# Patient Record
Sex: Female | Born: 1955 | Race: White | Hispanic: No | Smoking: Former smoker
Health system: Southern US, Community
[De-identification: ages and names within clinical notes are randomized; demographics above are authoritative.]

## PROBLEM LIST (undated history)

## (undated) DIAGNOSIS — I10 Essential (primary) hypertension: Secondary | ICD-10-CM

## (undated) DIAGNOSIS — R062 Wheezing: Secondary | ICD-10-CM

## (undated) DIAGNOSIS — F419 Anxiety disorder, unspecified: Secondary | ICD-10-CM

## (undated) DIAGNOSIS — R42 Dizziness and giddiness: Secondary | ICD-10-CM

## (undated) DIAGNOSIS — G629 Polyneuropathy, unspecified: Secondary | ICD-10-CM

## (undated) DIAGNOSIS — Z87442 Personal history of urinary calculi: Secondary | ICD-10-CM

## (undated) DIAGNOSIS — T8859XA Other complications of anesthesia, initial encounter: Secondary | ICD-10-CM

## (undated) DIAGNOSIS — R0601 Orthopnea: Secondary | ICD-10-CM

## (undated) DIAGNOSIS — R0600 Dyspnea, unspecified: Secondary | ICD-10-CM

## (undated) DIAGNOSIS — T4145XA Adverse effect of unspecified anesthetic, initial encounter: Secondary | ICD-10-CM

## (undated) DIAGNOSIS — D509 Iron deficiency anemia, unspecified: Secondary | ICD-10-CM

## (undated) DIAGNOSIS — E119 Type 2 diabetes mellitus without complications: Secondary | ICD-10-CM

## (undated) DIAGNOSIS — J449 Chronic obstructive pulmonary disease, unspecified: Secondary | ICD-10-CM

## (undated) DIAGNOSIS — E039 Hypothyroidism, unspecified: Secondary | ICD-10-CM

## (undated) DIAGNOSIS — K219 Gastro-esophageal reflux disease without esophagitis: Secondary | ICD-10-CM

## (undated) DIAGNOSIS — R609 Edema, unspecified: Secondary | ICD-10-CM

## (undated) DIAGNOSIS — D649 Anemia, unspecified: Secondary | ICD-10-CM

## (undated) DIAGNOSIS — I499 Cardiac arrhythmia, unspecified: Secondary | ICD-10-CM

## (undated) DIAGNOSIS — N189 Chronic kidney disease, unspecified: Secondary | ICD-10-CM

## (undated) DIAGNOSIS — I509 Heart failure, unspecified: Secondary | ICD-10-CM

## (undated) DIAGNOSIS — S72409A Unspecified fracture of lower end of unspecified femur, initial encounter for closed fracture: Secondary | ICD-10-CM

## (undated) DIAGNOSIS — G4730 Sleep apnea, unspecified: Secondary | ICD-10-CM

## (undated) HISTORY — DX: Iron deficiency anemia, unspecified: D50.9

## (undated) HISTORY — DX: Heart failure, unspecified: I50.9

## (undated) HISTORY — PX: TONSILLECTOMY: SUR1361

## (undated) HISTORY — PX: CHOLECYSTECTOMY: SHX55

---

## 2004-12-21 ENCOUNTER — Ambulatory Visit: Payer: Self-pay | Admitting: Unknown Physician Specialty

## 2004-12-30 ENCOUNTER — Ambulatory Visit: Payer: Self-pay | Admitting: Unknown Physician Specialty

## 2007-06-13 ENCOUNTER — Ambulatory Visit: Payer: Self-pay | Admitting: Unknown Physician Specialty

## 2007-08-09 ENCOUNTER — Ambulatory Visit: Payer: Self-pay | Admitting: Unknown Physician Specialty

## 2008-06-25 ENCOUNTER — Ambulatory Visit: Payer: Self-pay | Admitting: Unknown Physician Specialty

## 2009-09-23 ENCOUNTER — Ambulatory Visit: Payer: Self-pay | Admitting: Unknown Physician Specialty

## 2010-02-23 ENCOUNTER — Ambulatory Visit: Payer: Self-pay | Admitting: Ophthalmology

## 2010-03-02 ENCOUNTER — Ambulatory Visit: Payer: Self-pay | Admitting: Ophthalmology

## 2010-10-25 ENCOUNTER — Ambulatory Visit: Payer: Self-pay | Admitting: Family Medicine

## 2011-06-30 ENCOUNTER — Emergency Department: Payer: Self-pay | Admitting: Emergency Medicine

## 2012-02-04 ENCOUNTER — Ambulatory Visit: Payer: Self-pay | Admitting: Internal Medicine

## 2012-07-26 ENCOUNTER — Emergency Department: Payer: Self-pay | Admitting: Emergency Medicine

## 2013-08-07 ENCOUNTER — Ambulatory Visit: Payer: Self-pay | Admitting: Internal Medicine

## 2014-02-11 ENCOUNTER — Ambulatory Visit: Payer: Self-pay | Admitting: Physician Assistant

## 2014-02-11 LAB — RAPID STREP-A WITH REFLX: Micro Text Report: NEGATIVE

## 2014-02-13 LAB — BETA STREP CULTURE(ARMC)

## 2016-01-08 ENCOUNTER — Other Ambulatory Visit: Payer: Self-pay | Admitting: Otolaryngology

## 2016-01-08 ENCOUNTER — Ambulatory Visit
Admission: RE | Admit: 2016-01-08 | Discharge: 2016-01-08 | Disposition: A | Discharge status: Home / Self Care | Payer: Medicare Other | Source: Ambulatory Visit | Attending: Otolaryngology | Admitting: Otolaryngology

## 2016-01-08 DIAGNOSIS — R05 Cough: Secondary | ICD-10-CM | POA: Insufficient documentation

## 2016-01-08 DIAGNOSIS — R059 Cough, unspecified: Secondary | ICD-10-CM

## 2017-01-18 ENCOUNTER — Encounter: Payer: Self-pay | Admitting: *Deleted

## 2017-01-25 NOTE — H&P (Signed)
See scanned note.

## 2017-01-26 ENCOUNTER — Inpatient Hospital Stay
Admission: AD | Admit: 2017-01-26 | Discharge: 2017-01-26 | Disposition: A | Discharge status: Home / Self Care | Payer: Medicare Other | Source: Ambulatory Visit | Attending: Critical Care Medicine | Admitting: Critical Care Medicine

## 2017-01-26 ENCOUNTER — Inpatient Hospital Stay
Admission: AD | Admit: 2017-01-26 | Discharge: 2017-01-28 | DRG: 987 | Disposition: A | Discharge status: Home / Self Care | Payer: Medicare Other | Source: Ambulatory Visit | Attending: Internal Medicine | Admitting: Internal Medicine

## 2017-01-26 ENCOUNTER — Ambulatory Visit: Payer: Medicare Other | Admitting: Anesthesiology

## 2017-01-26 ENCOUNTER — Encounter: Payer: Self-pay | Admitting: *Deleted

## 2017-01-26 ENCOUNTER — Ambulatory Visit: Payer: Medicare Other

## 2017-01-26 ENCOUNTER — Encounter
Admission: AD | Disposition: A | Discharge status: Home / Self Care | Payer: Self-pay | Source: Ambulatory Visit | Attending: Internal Medicine

## 2017-01-26 DIAGNOSIS — E1122 Type 2 diabetes mellitus with diabetic chronic kidney disease: Secondary | ICD-10-CM | POA: Diagnosis present

## 2017-01-26 DIAGNOSIS — J9601 Acute respiratory failure with hypoxia: Principal | ICD-10-CM | POA: Diagnosis present

## 2017-01-26 DIAGNOSIS — R0602 Shortness of breath: Secondary | ICD-10-CM

## 2017-01-26 DIAGNOSIS — E1140 Type 2 diabetes mellitus with diabetic neuropathy, unspecified: Secondary | ICD-10-CM | POA: Diagnosis present

## 2017-01-26 DIAGNOSIS — H2512 Age-related nuclear cataract, left eye: Secondary | ICD-10-CM | POA: Diagnosis present

## 2017-01-26 DIAGNOSIS — I480 Paroxysmal atrial fibrillation: Secondary | ICD-10-CM | POA: Diagnosis present

## 2017-01-26 DIAGNOSIS — Z6841 Body Mass Index (BMI) 40.0 and over, adult: Secondary | ICD-10-CM

## 2017-01-26 DIAGNOSIS — Z87891 Personal history of nicotine dependence: Secondary | ICD-10-CM | POA: Diagnosis not present

## 2017-01-26 DIAGNOSIS — I129 Hypertensive chronic kidney disease with stage 1 through stage 4 chronic kidney disease, or unspecified chronic kidney disease: Secondary | ICD-10-CM | POA: Diagnosis present

## 2017-01-26 DIAGNOSIS — J811 Chronic pulmonary edema: Secondary | ICD-10-CM

## 2017-01-26 DIAGNOSIS — G4733 Obstructive sleep apnea (adult) (pediatric): Secondary | ICD-10-CM | POA: Diagnosis present

## 2017-01-26 DIAGNOSIS — F410 Panic disorder [episodic paroxysmal anxiety] without agoraphobia: Secondary | ICD-10-CM | POA: Diagnosis present

## 2017-01-26 DIAGNOSIS — I248 Other forms of acute ischemic heart disease: Secondary | ICD-10-CM | POA: Diagnosis present

## 2017-01-26 DIAGNOSIS — T45515A Adverse effect of anticoagulants, initial encounter: Secondary | ICD-10-CM | POA: Diagnosis present

## 2017-01-26 DIAGNOSIS — K219 Gastro-esophageal reflux disease without esophagitis: Secondary | ICD-10-CM | POA: Diagnosis present

## 2017-01-26 DIAGNOSIS — N179 Acute kidney failure, unspecified: Secondary | ICD-10-CM | POA: Diagnosis present

## 2017-01-26 DIAGNOSIS — J189 Pneumonia, unspecified organism: Secondary | ICD-10-CM | POA: Diagnosis present

## 2017-01-26 DIAGNOSIS — N182 Chronic kidney disease, stage 2 (mild): Secondary | ICD-10-CM | POA: Diagnosis present

## 2017-01-26 DIAGNOSIS — Z7901 Long term (current) use of anticoagulants: Secondary | ICD-10-CM | POA: Diagnosis not present

## 2017-01-26 DIAGNOSIS — J9600 Acute respiratory failure, unspecified whether with hypoxia or hypercapnia: Secondary | ICD-10-CM | POA: Diagnosis present

## 2017-01-26 DIAGNOSIS — D649 Anemia, unspecified: Secondary | ICD-10-CM | POA: Diagnosis present

## 2017-01-26 DIAGNOSIS — E039 Hypothyroidism, unspecified: Secondary | ICD-10-CM | POA: Diagnosis present

## 2017-01-26 DIAGNOSIS — J810 Acute pulmonary edema: Secondary | ICD-10-CM

## 2017-01-26 DIAGNOSIS — R0902 Hypoxemia: Secondary | ICD-10-CM

## 2017-01-26 HISTORY — DX: Orthopnea: R06.01

## 2017-01-26 HISTORY — DX: Personal history of urinary calculi: Z87.442

## 2017-01-26 HISTORY — DX: Gastro-esophageal reflux disease without esophagitis: K21.9

## 2017-01-26 HISTORY — DX: Dyspnea, unspecified: R06.00

## 2017-01-26 HISTORY — DX: Hypothyroidism, unspecified: E03.9

## 2017-01-26 HISTORY — DX: Edema, unspecified: R60.9

## 2017-01-26 HISTORY — DX: Polyneuropathy, unspecified: G62.9

## 2017-01-26 HISTORY — DX: Cardiac arrhythmia, unspecified: I49.9

## 2017-01-26 HISTORY — DX: Dizziness and giddiness: R42

## 2017-01-26 HISTORY — DX: Anemia, unspecified: D64.9

## 2017-01-26 HISTORY — DX: Anxiety disorder, unspecified: F41.9

## 2017-01-26 HISTORY — DX: Wheezing: R06.2

## 2017-01-26 HISTORY — DX: Type 2 diabetes mellitus without complications: E11.9

## 2017-01-26 HISTORY — DX: Sleep apnea, unspecified: G47.30

## 2017-01-26 HISTORY — DX: Essential (primary) hypertension: I10

## 2017-01-26 HISTORY — PX: CATARACT EXTRACTION W/PHACO: SHX586

## 2017-01-26 HISTORY — DX: Chronic kidney disease, unspecified: N18.9

## 2017-01-26 LAB — RAPID HIV SCREEN (HIV 1/2 AB+AG)
HIV 1/2 Antibodies: NONREACTIVE
HIV-1 P24 Antigen - HIV24: NONREACTIVE

## 2017-01-26 LAB — CBC
HCT: 31.2 % — ABNORMAL LOW (ref 35.0–47.0)
HEMOGLOBIN: 9.1 g/dL — AB (ref 12.0–16.0)
MCH: 20.4 pg — AB (ref 26.0–34.0)
MCHC: 29.2 g/dL — AB (ref 32.0–36.0)
MCV: 69.9 fL — ABNORMAL LOW (ref 80.0–100.0)
Platelets: 312 10*3/uL (ref 150–440)
RBC: 4.46 MIL/uL (ref 3.80–5.20)
RDW: 19.8 % — ABNORMAL HIGH (ref 11.5–14.5)
WBC: 20.8 10*3/uL — ABNORMAL HIGH (ref 3.6–11.0)

## 2017-01-26 LAB — BASIC METABOLIC PANEL
Anion gap: 10 (ref 5–15)
BUN: 56 mg/dL — ABNORMAL HIGH (ref 6–20)
CHLORIDE: 105 mmol/L (ref 101–111)
CO2: 24 mmol/L (ref 22–32)
CREATININE: 1.67 mg/dL — AB (ref 0.44–1.00)
Calcium: 8.8 mg/dL — ABNORMAL LOW (ref 8.9–10.3)
GFR, EST AFRICAN AMERICAN: 37 mL/min — AB (ref >60)
GFR, EST NON AFRICAN AMERICAN: 32 mL/min — AB (ref >60)
Glucose, Bld: 280 mg/dL — ABNORMAL HIGH (ref 65–99)
Potassium: 4.6 mmol/L (ref 3.5–5.1)
Sodium: 139 mmol/L (ref 135–145)

## 2017-01-26 LAB — TROPONIN I: TROPONIN I: 0.03 ng/mL — AB (ref <0.03)

## 2017-01-26 LAB — PROTIME-INR
INR: 2.3
Prothrombin Time: 25.7 seconds — ABNORMAL HIGH (ref 11.4–15.2)

## 2017-01-26 LAB — PROCALCITONIN

## 2017-01-26 LAB — GLUCOSE, CAPILLARY
GLUCOSE-CAPILLARY: 261 mg/dL — AB (ref 65–99)
Glucose-Capillary: 150 mg/dL — ABNORMAL HIGH (ref 65–99)
Glucose-Capillary: 255 mg/dL — ABNORMAL HIGH (ref 65–99)
Glucose-Capillary: 314 mg/dL — ABNORMAL HIGH (ref 65–99)

## 2017-01-26 LAB — BRAIN NATRIURETIC PEPTIDE: B Natriuretic Peptide: 816 pg/mL — ABNORMAL HIGH (ref 0.0–100.0)

## 2017-01-26 SURGERY — PHACOEMULSIFICATION, CATARACT, WITH IOL INSERTION
Anesthesia: Monitor Anesthesia Care | Laterality: Left

## 2017-01-26 MED ORDER — GLIMEPIRIDE 2 MG PO TABS
2.0000 mg | ORAL_TABLET | Freq: Every day | ORAL | Status: DC
  Administered 2017-01-27 – 2017-01-28 (×2): 2 mg via ORAL
  Filled 2017-01-26 (×2): qty 1

## 2017-01-26 MED ORDER — NEOSTIGMINE METHYLSULFATE 10 MG/10ML IV SOLN
INTRAVENOUS | Status: AC
  Filled 2017-01-26: qty 1

## 2017-01-26 MED ORDER — TETRACAINE HCL 0.5 % OP SOLN
OPHTHALMIC | Status: DC | PRN
  Administered 2017-01-26: 2 [drp] via OPHTHALMIC

## 2017-01-26 MED ORDER — LIDOCAINE HCL (PF) 4 % IJ SOLN
INTRAMUSCULAR | Status: DC | PRN
  Administered 2017-01-26: 5 mL via OPHTHALMIC

## 2017-01-26 MED ORDER — LINAGLIPTIN 5 MG PO TABS
5.0000 mg | ORAL_TABLET | Freq: Every day | ORAL | Status: DC
  Administered 2017-01-26 – 2017-01-28 (×3): 5 mg via ORAL
  Filled 2017-01-26 (×3): qty 1

## 2017-01-26 MED ORDER — TETRACAINE HCL 0.5 % OP SOLN
OPHTHALMIC | Status: AC
  Filled 2017-01-26: qty 2

## 2017-01-26 MED ORDER — NA CHONDROIT SULF-NA HYALURON 40-17 MG/ML IO SOLN
INTRAOCULAR | Status: DC | PRN
  Administered 2017-01-26: 1 mL via INTRAOCULAR

## 2017-01-26 MED ORDER — ALFENTANIL 500 MCG/ML IJ INJ
INJECTION | INTRAVENOUS | Status: DC | PRN
  Administered 2017-01-26: 500 ug via INTRAVENOUS

## 2017-01-26 MED ORDER — PHENYLEPHRINE HCL 10 MG/ML IJ SOLN
INTRAMUSCULAR | Status: AC
  Filled 2017-01-26: qty 1

## 2017-01-26 MED ORDER — NA CHONDROIT SULF-NA HYALURON 40-17 MG/ML IO SOLN
INTRAOCULAR | Status: AC
  Filled 2017-01-26: qty 1

## 2017-01-26 MED ORDER — INSULIN GLARGINE 100 UNIT/ML ~~LOC~~ SOLN
12.0000 [IU] | Freq: Every day | SUBCUTANEOUS | Status: DC
  Administered 2017-01-26 – 2017-01-27 (×2): 12 [IU] via SUBCUTANEOUS
  Filled 2017-01-26 (×4): qty 0.12

## 2017-01-26 MED ORDER — PANTOPRAZOLE SODIUM 40 MG PO TBEC
40.0000 mg | DELAYED_RELEASE_TABLET | Freq: Two times a day (BID) | ORAL | Status: DC
  Administered 2017-01-26 – 2017-01-28 (×4): 40 mg via ORAL
  Filled 2017-01-26 (×4): qty 1

## 2017-01-26 MED ORDER — CELECOXIB 100 MG PO CAPS
200.0000 mg | ORAL_CAPSULE | Freq: Every day | ORAL | Status: DC
  Administered 2017-01-26 – 2017-01-28 (×2): 200 mg via ORAL
  Filled 2017-01-26: qty 1
  Filled 2017-01-26: qty 2
  Filled 2017-01-26: qty 1

## 2017-01-26 MED ORDER — LIDOCAINE HCL (PF) 4 % IJ SOLN
INTRAOCULAR | Status: DC | PRN
  Administered 2017-01-26: .5 mL via OPHTHALMIC

## 2017-01-26 MED ORDER — ALBUTEROL SULFATE HFA 108 (90 BASE) MCG/ACT IN AERS
INHALATION_SPRAY | RESPIRATORY_TRACT | Status: DC | PRN
  Administered 2017-01-26: 10 via RESPIRATORY_TRACT
  Administered 2017-01-26: 2 via RESPIRATORY_TRACT

## 2017-01-26 MED ORDER — CYCLOPENTOLATE HCL 2 % OP SOLN
1.0000 [drp] | OPHTHALMIC | Status: AC
  Administered 2017-01-26 (×2): 1 [drp] via OPHTHALMIC

## 2017-01-26 MED ORDER — EPHEDRINE SULFATE 50 MG/ML IJ SOLN
INTRAMUSCULAR | Status: DC | PRN
  Administered 2017-01-26: 10 mg via INTRAVENOUS

## 2017-01-26 MED ORDER — EPINEPHRINE PF 1 MG/ML IJ SOLN
INTRAMUSCULAR | Status: AC
  Filled 2017-01-26: qty 2

## 2017-01-26 MED ORDER — PHENYLEPHRINE HCL 10 % OP SOLN
OPHTHALMIC | Status: AC
  Administered 2017-01-26: 09:00:00
  Filled 2017-01-26: qty 5

## 2017-01-26 MED ORDER — MOXIFLOXACIN HCL 0.5 % OP SOLN
OPHTHALMIC | Status: AC
  Filled 2017-01-26: qty 3

## 2017-01-26 MED ORDER — SODIUM CHLORIDE 0.9 % IV SOLN: 250.0000 mL | INTRAVENOUS | Status: DC | PRN

## 2017-01-26 MED ORDER — PHENYLEPHRINE HCL 10 % OP SOLN
1.0000 [drp] | OPHTHALMIC | Status: AC
  Administered 2017-01-26 (×3): 1 [drp] via OPHTHALMIC

## 2017-01-26 MED ORDER — SODIUM CHLORIDE 0.9% FLUSH
3.0000 mL | Freq: Two times a day (BID) | INTRAVENOUS | Status: DC
  Administered 2017-01-27 (×2): 3 mL via INTRAVENOUS

## 2017-01-26 MED ORDER — MAGNESIUM OXIDE 400 (241.3 MG) MG PO TABS
400.0000 mg | ORAL_TABLET | Freq: Two times a day (BID) | ORAL | Status: DC
  Administered 2017-01-26 – 2017-01-28 (×4): 400 mg via ORAL
  Filled 2017-01-26 (×4): qty 1

## 2017-01-26 MED ORDER — CYCLOPENTOLATE HCL 2 % OP SOLN
OPHTHALMIC | Status: AC
  Filled 2017-01-26: qty 2

## 2017-01-26 MED ORDER — SUCCINYLCHOLINE CHLORIDE 20 MG/ML IJ SOLN
INTRAMUSCULAR | Status: DC | PRN
  Administered 2017-01-26: 80 mg via INTRAVENOUS

## 2017-01-26 MED ORDER — FUROSEMIDE 10 MG/ML IJ SOLN
INTRAMUSCULAR | Status: AC
  Administered 2017-01-26: 10 mg via INTRAVENOUS
  Filled 2017-01-26: qty 2

## 2017-01-26 MED ORDER — SODIUM CHLORIDE 0.9 % IV SOLN
INTRAVENOUS | Status: DC
  Administered 2017-01-26 (×2): via INTRAVENOUS

## 2017-01-26 MED ORDER — BENAZEPRIL HCL 20 MG PO TABS
20.0000 mg | ORAL_TABLET | Freq: Every day | ORAL | Status: DC
  Filled 2017-01-26 (×2): qty 1

## 2017-01-26 MED ORDER — SUGAMMADEX SODIUM 200 MG/2ML IV SOLN
INTRAVENOUS | Status: DC | PRN
  Administered 2017-01-26: 200 mg via INTRAVENOUS

## 2017-01-26 MED ORDER — ROCURONIUM BROMIDE 100 MG/10ML IV SOLN
INTRAVENOUS | Status: DC | PRN
  Administered 2017-01-26: 20 mg via INTRAVENOUS

## 2017-01-26 MED ORDER — METOPROLOL SUCCINATE ER 50 MG PO TB24
100.0000 mg | ORAL_TABLET | Freq: Every morning | ORAL | Status: DC
  Administered 2017-01-27 – 2017-01-28 (×2): 100 mg via ORAL
  Filled 2017-01-26 (×2): qty 2

## 2017-01-26 MED ORDER — PHENYLEPHRINE HCL 10 MG/ML IJ SOLN
INTRAMUSCULAR | Status: DC | PRN
  Administered 2017-01-26 (×2): 100 ug via INTRAVENOUS

## 2017-01-26 MED ORDER — EPINEPHRINE PF 1 MG/ML IJ SOLN
INTRAMUSCULAR | Status: DC | PRN
  Administered 2017-01-26: 250 mL via OPHTHALMIC

## 2017-01-26 MED ORDER — CITALOPRAM HYDROBROMIDE 20 MG PO TABS
20.0000 mg | ORAL_TABLET | Freq: Every day | ORAL | Status: DC
Start: 2017-01-26 — End: 2017-01-28
  Administered 2017-01-26 – 2017-01-28 (×3): 20 mg via ORAL
  Filled 2017-01-26 (×3): qty 1

## 2017-01-26 MED ORDER — AMLODIPINE BESYLATE 5 MG PO TABS: 5.0000 mg | ORAL_TABLET | Freq: Every day | ORAL | Status: DC

## 2017-01-26 MED ORDER — FUROSEMIDE 10 MG/ML IJ SOLN
20.0000 mg | Freq: Once | INTRAMUSCULAR | Status: AC
  Administered 2017-01-26: 20 mg via INTRAVENOUS
  Filled 2017-01-26: qty 2

## 2017-01-26 MED ORDER — CARBACHOL 0.01 % IO SOLN
INTRAOCULAR | Status: DC | PRN
  Administered 2017-01-26: 0.5 mL via INTRAOCULAR

## 2017-01-26 MED ORDER — LACTATED RINGERS IV SOLN
INTRAVENOUS | Status: DC | PRN
Start: 2017-01-26 — End: 2017-01-26
  Administered 2017-01-26: 11:00:00 via INTRAVENOUS

## 2017-01-26 MED ORDER — HYALURONIDASE HUMAN 150 UNIT/ML IJ SOLN
INTRAMUSCULAR | Status: AC
  Filled 2017-01-26: qty 1

## 2017-01-26 MED ORDER — LORATADINE 10 MG PO TABS
10.0000 mg | ORAL_TABLET | Freq: Every day | ORAL | Status: DC
  Administered 2017-01-27 – 2017-01-28 (×2): 10 mg via ORAL
  Filled 2017-01-26 (×2): qty 1

## 2017-01-26 MED ORDER — PIPERACILLIN-TAZOBACTAM 3.375 G IVPB
3.3750 g | Freq: Three times a day (TID) | INTRAVENOUS | Status: DC
  Administered 2017-01-26 – 2017-01-27 (×2): 3.375 g via INTRAVENOUS
  Filled 2017-01-26 (×5): qty 50

## 2017-01-26 MED ORDER — CEFUROXIME OPHTHALMIC INJECTION 1 MG/0.1 ML
INJECTION | OPHTHALMIC | Status: DC | PRN
  Administered 2017-01-26: 0.1 mL via INTRACAMERAL

## 2017-01-26 MED ORDER — CEFUROXIME OPHTHALMIC INJECTION 1 MG/0.1 ML
INJECTION | OPHTHALMIC | Status: AC
  Filled 2017-01-26: qty 0.1

## 2017-01-26 MED ORDER — SODIUM CHLORIDE 0.9% FLUSH: 3.0000 mL | INTRAVENOUS | Status: DC | PRN

## 2017-01-26 MED ORDER — MIDAZOLAM HCL 2 MG/2ML IJ SOLN
INTRAMUSCULAR | Status: AC
  Filled 2017-01-26: qty 2

## 2017-01-26 MED ORDER — ALFENTANIL 500 MCG/ML IJ INJ
INJECTION | INTRAVENOUS | Status: AC
  Filled 2017-01-26: qty 5

## 2017-01-26 MED ORDER — NEOSTIGMINE METHYLSULFATE 10 MG/10ML IV SOLN
INTRAVENOUS | Status: DC | PRN
  Administered 2017-01-26: 3 mg via INTRAVENOUS

## 2017-01-26 MED ORDER — FUROSEMIDE 10 MG/ML IJ SOLN
10.0000 mg | Freq: Once | INTRAMUSCULAR | Status: AC
  Administered 2017-01-26: 10 mg via INTRAVENOUS

## 2017-01-26 MED ORDER — LEVOTHYROXINE SODIUM 50 MCG PO TABS: 175.0000 ug | ORAL_TABLET | Freq: Every day | ORAL | Status: DC

## 2017-01-26 MED ORDER — PRAVASTATIN SODIUM 20 MG PO TABS
10.0000 mg | ORAL_TABLET | Freq: Every day | ORAL | Status: DC
  Administered 2017-01-26 – 2017-01-27 (×2): 10 mg via ORAL
  Filled 2017-01-26 (×2): qty 1

## 2017-01-26 MED ORDER — PROPOFOL 10 MG/ML IV BOLUS
INTRAVENOUS | Status: DC | PRN
  Administered 2017-01-26: 150 mg via INTRAVENOUS

## 2017-01-26 MED ORDER — LIDOCAINE HCL (PF) 4 % IJ SOLN
INTRAMUSCULAR | Status: AC
  Filled 2017-01-26: qty 5

## 2017-01-26 MED ORDER — POVIDONE-IODINE 5 % OP SOLN
OPHTHALMIC | Status: AC
  Filled 2017-01-26: qty 30

## 2017-01-26 MED ORDER — AMLODIPINE BESY-BENAZEPRIL HCL 5-20 MG PO CAPS: 1.0000 | ORAL_CAPSULE | Freq: Every day | ORAL | Status: DC

## 2017-01-26 MED ORDER — MOXIFLOXACIN HCL 0.5 % OP SOLN
1.0000 [drp] | OPHTHALMIC | Status: AC
  Administered 2017-01-26: 1 [drp] via OPHTHALMIC

## 2017-01-26 MED ORDER — METHYLPREDNISOLONE SODIUM SUCC 125 MG IJ SOLR
INTRAMUSCULAR | Status: DC | PRN
  Administered 2017-01-26: 125 mg via INTRAVENOUS

## 2017-01-26 MED ORDER — POVIDONE-IODINE 5 % OP SOLN
OPHTHALMIC | Status: DC | PRN
  Administered 2017-01-26: 1 via OPHTHALMIC

## 2017-01-26 MED ORDER — MOXIFLOXACIN HCL 0.5 % OP SOLN
OPHTHALMIC | Status: DC | PRN
  Administered 2017-01-26: 2 [drp] via OPHTHALMIC

## 2017-01-26 MED ORDER — INSULIN ASPART 100 UNIT/ML ~~LOC~~ SOLN
0.0000 [IU] | Freq: Every day | SUBCUTANEOUS | Status: DC
  Administered 2017-01-26: 4 [IU] via SUBCUTANEOUS
  Administered 2017-01-27: 22:00:00 2 [IU] via SUBCUTANEOUS
  Filled 2017-01-26: qty 1
  Filled 2017-01-26: qty 2

## 2017-01-26 MED ORDER — DEXAMETHASONE SODIUM PHOSPHATE 10 MG/ML IJ SOLN
INTRAMUSCULAR | Status: DC | PRN
  Administered 2017-01-26: 10 mg via INTRAVENOUS

## 2017-01-26 MED ORDER — GLYCOPYRROLATE 0.2 MG/ML IV SOSY
PREFILLED_SYRINGE | INTRAVENOUS | Status: DC | PRN
  Administered 2017-01-26: 0.6 mg via INTRAVENOUS
  Administered 2017-01-26: .2 mg via INTRAVENOUS

## 2017-01-26 MED ORDER — INSULIN ASPART 100 UNIT/ML ~~LOC~~ SOLN
0.0000 [IU] | Freq: Three times a day (TID) | SUBCUTANEOUS | Status: DC
  Administered 2017-01-26: 8 [IU] via SUBCUTANEOUS
  Administered 2017-01-27 (×2): 3 [IU] via SUBCUTANEOUS
  Administered 2017-01-27: 5 [IU] via SUBCUTANEOUS
  Administered 2017-01-28: 3 [IU] via SUBCUTANEOUS
  Filled 2017-01-26: qty 1
  Filled 2017-01-26 (×2): qty 3
  Filled 2017-01-26: qty 1
  Filled 2017-01-26: qty 3

## 2017-01-26 MED ORDER — MIDAZOLAM HCL 2 MG/2ML IJ SOLN
INTRAMUSCULAR | Status: DC | PRN
  Administered 2017-01-26: 1 mg via INTRAVENOUS

## 2017-01-26 MED ORDER — BUPIVACAINE HCL (PF) 0.75 % IJ SOLN
INTRAMUSCULAR | Status: AC
  Filled 2017-01-26: qty 10

## 2017-01-26 SURGICAL SUPPLY — 28 items
CORD BIP STRL DISP 12FT (MISCELLANEOUS) ×3 IMPLANT
DRAPE XRAY CASSETTE 23X24 (DRAPES) ×3 IMPLANT
ERASER HMR WETFIELD 18G (MISCELLANEOUS) ×3 IMPLANT
GLOVE BIO SURGEON STRL SZ8 (GLOVE) ×3 IMPLANT
GLOVE SURG LX 6.5 MICRO (GLOVE) ×2
GLOVE SURG LX 8.0 MICRO (GLOVE) ×2
GLOVE SURG LX STRL 6.5 MICRO (GLOVE) ×1 IMPLANT
GLOVE SURG LX STRL 8.0 MICRO (GLOVE) ×1 IMPLANT
GOWN STRL REUS W/ TWL LRG LVL3 (GOWN DISPOSABLE) ×1 IMPLANT
GOWN STRL REUS W/ TWL XL LVL3 (GOWN DISPOSABLE) ×1 IMPLANT
GOWN STRL REUS W/TWL LRG LVL3 (GOWN DISPOSABLE) ×2
GOWN STRL REUS W/TWL XL LVL3 (GOWN DISPOSABLE) ×2
LENS IOL ACRSF IQ ULTRA 24.0 (Intraocular Lens) ×1 IMPLANT
LENS IOL ACRYSOF IQ 24.0 (Intraocular Lens) ×3 IMPLANT
PACK CATARACT (MISCELLANEOUS) ×3 IMPLANT
PACK CATARACT DINGLEDEIN LX (MISCELLANEOUS) ×3 IMPLANT
PACK EYE AFTER SURG (MISCELLANEOUS) ×3 IMPLANT
SHLD EYE VISITEC  UNIV (MISCELLANEOUS) ×3 IMPLANT
SOL BAL SALT 15ML (MISCELLANEOUS) ×6
SOL BSS BAG (MISCELLANEOUS) ×3
SOLUTION BAL SALT 15ML (MISCELLANEOUS) ×2 IMPLANT
SOLUTION BSS BAG (MISCELLANEOUS) ×1 IMPLANT
STRAP SAFETY BODY (MISCELLANEOUS) ×9 IMPLANT
SUT ETHILON 10 0 CS140 6 (SUTURE) ×3 IMPLANT
SUT SILK 5-0 (SUTURE) ×3 IMPLANT
SYR 5ML LL (SYRINGE) ×3 IMPLANT
WATER STERILE IRR 250ML POUR (IV SOLUTION) ×3 IMPLANT
WIPE NON LINTING 3.25X3.25 (MISCELLANEOUS) ×3 IMPLANT

## 2017-01-26 NOTE — Interval H&P Note (Signed)
History and Physical Interval Note:  01/26/2017 9:43 AM  Andrea Rivers  has presented today for surgery, with the diagnosis of CATARACT  The various methods of treatment have been discussed with the patient and family. After consideration of risks, benefits and other options for treatment, the patient has consented to  Procedure(s): CATARACT EXTRACTION PHACO AND INTRAOCULAR LENS PLACEMENT ([place]) (Left) as a surgical intervention .  The patient's history has been reviewed, patient examined, no change in status, stable for surgery.  I have reviewed the patient's chart and labs.  Questions were answered to the patient's satisfaction.     Catherene Kaleta

## 2017-01-26 NOTE — Progress Notes (Signed)
Lab work done cxr done   Dr Sandra Cockayne  To see pt

## 2017-01-26 NOTE — H&P (Signed)
Andrea Rivers is an 61 y.o. female.   Chief Complaint: Hypoxia HPI: This is 61 year old female who was taken to the OR earlier today to have left cataract repair. During surgery she became hypoxic. She in the anesthesiologist describe a panic attack. She said she's had these before and is probably get him when she lies flat. Patient had to be intubated during that time. She's been extubated and currently is in the PACU on BiPAP. She has no history of heart failure but does have a history of atrial fibrillation and is therapeutic on her Coumadin. She does have a remote history of smoking but never diagnosed with COPD.  Past Medical History:  Diagnosis Date  . Anemia   . Anxiety   . Chronic kidney disease    INSUFFICIENCY  . Diabetes mellitus without complication ([place])   . Dyspnea    DOE  . Dysrhythmia    A FIB  . Edema   . GERD (gastroesophageal reflux disease)   . History of kidney stones   . Hypertension   . Hypothyroidism    ABLATION  . Neuropathy   . Orthopnea   . Sleep apnea    CPAP  . Vertigo   . Wheezing     Past Surgical History:  Procedure Laterality Date  . CHOLECYSTECTOMY    . TONSILLECTOMY      History reviewed. No pertinent family history. Social History:  reports that she has quit smoking. She has quit using smokeless tobacco. She reports that she does not drink alcohol. Her drug history is not on file.  Allergies:  Allergies  Allergen Reactions  . Other Anaphylaxis    ILOSONE,  Caused GI distress.    Medications Prior to Admission  Medication Sig Dispense Refill  . amLODipine-benazepril (LOTREL) 5-20 MG capsule Take 1 capsule by mouth daily.    . celecoxib (CELEBREX) 200 MG capsule Take 200 mg by mouth daily.    . citalopram (CELEXA) 20 MG tablet Take 20 mg by mouth daily.    Marland Kitchen glimepiride (AMARYL) 2 MG tablet Take 2 mg by mouth daily with breakfast.    . insulin glargine (LANTUS) 100 UNIT/ML injection Inject 12 Units into the skin at bedtime.    .  insulin lispro (HUMALOG) 100 UNIT/ML injection Inject 40 Units into the skin 2 (two) times daily as needed for high blood sugar.    . levothyroxine (SYNTHROID, LEVOTHROID) 175 MCG tablet Take 175 mcg by mouth daily before breakfast.    . linagliptin (TRADJENTA) 5 MG TABS tablet Take 5 mg by mouth daily.    Marland Kitchen loratadine (CLARITIN) 10 MG tablet Take 10 mg by mouth daily.    Marland Kitchen lovastatin (MEVACOR) 20 MG tablet Take 20 mg by mouth at bedtime.    . magnesium oxide (MAG-OX) 400 MG tablet Take 400 mg by mouth 2 (two) times daily.    . meclizine (ANTIVERT) 12.5 MG tablet Take 12.5 mg by mouth.    . metFORMIN (GLUCOPHAGE) 1000 MG tablet Take 1,000 mg by mouth 2 (two) times daily with a meal.    . METOPROLOL SUCCINATE ER PO Take 100 mg by mouth every morning.    . pantoprazole (PROTONIX) 40 MG tablet Take 40 mg by mouth 2 (two) times daily.    . traMADol (ULTRAM) 50 MG tablet Take 50 mg by mouth 3 (three) times daily as needed.    . warfarin (COUMADIN) 1 MG tablet Take 0.5 mg by mouth daily at 6 PM.    .  warfarin (COUMADIN) 7.5 MG tablet Take 7.5 mg by mouth daily at 6 PM.    . OxyCODONE HCl, Abuse Deter, (OXAYDO) 5 MG TABA Take 5 mg by mouth 3 (three) times daily as needed.      Results for orders placed or performed during the hospital encounter of 01/26/17 (from the past 48 hour(s))  Protime-INR     Status: Abnormal   Collection Time: 01/26/17  7:39 AM  Result Value Ref Range   Prothrombin Time 25.7 (H) 11.4 - 15.2 seconds   INR 2.30   Glucose, capillary     Status: Abnormal   Collection Time: 01/26/17  7:51 AM  Result Value Ref Range   Glucose-Capillary 150 (H) 65 - 99 mg/dL  Glucose, capillary     Status: Abnormal   Collection Time: 01/26/17 11:50 AM  Result Value Ref Range   Glucose-Capillary 261 (H) 65 - 99 mg/dL  Blood gas, arterial     Status: Abnormal (Preliminary result)   Collection Time: 01/26/17  1:14 PM  Result Value Ref Range   FIO2 0.50    Delivery systems BILEVEL POSITIVE  AIRWAY PRESSURE    LHR 8 resp/min   Inspiratory PAP 12    Expiratory PAP 6.0    pH, Arterial 7.32 (L) 7.350 - 7.450   pCO2 arterial 45 32.0 - 48.0 mmHg   pO2, Arterial 65 (L) 83.0 - 108.0 mmHg   Bicarbonate 23.2 20.0 - 28.0 mmol/L   Acid-base deficit 2.9 (H) 0.0 - 2.0 mmol/L   O2 Saturation 90.5 %   Patient temperature 37.0    Oxygen index PENDING    Collection site RIGHT BRACHIAL    Sample type ARTERIAL DRAW    Allens test (pass/fail) PENDING PASS  CBC     Status: Abnormal   Collection Time: 01/26/17  1:37 PM  Result Value Ref Range   WBC 20.8 (H) 3.6 - 11.0 K/uL   RBC 4.46 3.80 - 5.20 MIL/uL   Hemoglobin 9.1 (L) 12.0 - 16.0 g/dL    Comment: RESULT REPEATED AND VERIFIED   HCT 31.2 (L) 35.0 - 47.0 %   MCV 69.9 (L) 80.0 - 100.0 fL   MCH 20.4 (L) 26.0 - 34.0 pg   MCHC 29.2 (L) 32.0 - 36.0 g/dL   RDW 19.8 (H) 11.5 - 14.5 %   Platelets 312 150 - 440 K/uL  Basic metabolic panel     Status: Abnormal   Collection Time: 01/26/17  1:37 PM  Result Value Ref Range   Sodium 139 135 - 145 mmol/L   Potassium 4.6 3.5 - 5.1 mmol/L   Chloride 105 101 - 111 mmol/L   CO2 24 22 - 32 mmol/L   Glucose, Bld 280 (H) 65 - 99 mg/dL   BUN 56 (H) 6 - 20 mg/dL   Creatinine, Ser 1.67 (H) 0.44 - 1.00 mg/dL   Calcium 8.8 (L) 8.9 - 10.3 mg/dL   GFR calc non Af Amer 32 (L) >60 mL/min   GFR calc Af Amer 37 (L) >60 mL/min    Comment: (NOTE) The eGFR has been calculated using the CKD EPI equation. This calculation has not been validated in all clinical situations. eGFR's persistently <60 mL/min signify possible Chronic Kidney Disease.    Anion gap 10 5 - 15   Dg Chest Port 1 View  Result Date: 01/26/2017 CLINICAL DATA:  Shortness breath.  History of diabetes and edema. EXAM: PORTABLE CHEST 1 VIEW COMPARISON:  Chest radiograph January 08, 2016 FINDINGS: Cardiac silhouette  is mildly enlarged and unchanged. Calcified aortic knob. Interstitial and predominately LEFT lung alveolar airspace opacities. No pleural  effusion. No pneumothorax. Bronchitic changes. Soft tissue planes and included osseous structures are nonsuspicious. Large body habitus. IMPRESSION: Interstitial and alveolar airspace opacities can be seen with pulmonary edema or infection. Stable cardiomegaly. Electronically Signed   By: Elon Alas M.D.   On: 01/26/2017 13:34    Review of Systems  Constitutional: Negative for chills and fever.  HENT: Negative for hearing loss.   Eyes: Negative for blurred vision.  Respiratory: Positive for shortness of breath. Negative for sputum production.   Cardiovascular: Negative for chest pain.  Gastrointestinal: Negative for nausea and vomiting.  Genitourinary: Negative for dysuria.  Musculoskeletal: Negative for joint pain.  Skin: Negative for rash.  Neurological: Negative for dizziness.    Blood pressure (!) 103/49, pulse 70, temperature 97.9 F (36.6 C), resp. rate 18, height _0  (1.676 m), weight (!) 137 kg (302 lb), SpO2 100 %. Physical Exam  Constitutional: She is oriented to person, place, and time. She appears well-developed and well-nourished.  On BiPAP in mild respiratory distress.  HENT:  Head: Normocephalic and atraumatic.  Mouth/Throat: Oropharynx is clear and moist. No oropharyngeal exudate.  Eyes: Pupils are equal, round, and reactive to light. No scleral icterus.  Neck: Neck supple. No JVD present. No tracheal deviation present. No thyromegaly present.  Cardiovascular:  Irregularly irregular no murmurs  Respiratory:  Clear to auscultation. No dullness to percussion Mildly using a sensory muscles on BiPAP.  GI: Soft. Bowel sounds are normal. She exhibits no distension and no mass. There is no tenderness. There is no rebound and no guarding.  Musculoskeletal: She exhibits no edema or tenderness.  Lymphadenopathy:    She has no cervical adenopathy.  Neurological: She is alert and oriented to person, place, and time.  Skin: Skin is warm.     Assessment/Plan 1.  Acute respiratory failure. At this point etiology is unclear. Her chest x-ray is not very impressive may be a left lower lobe infiltrate versus asymmetric edema. She got some Lasix and has urinated 500 cc. I gave her a trial off her BiPAP and her sats fell to the 70s and remained there despite being on 3 L nasal cannula. Placed her back on the BiPAP. Doubt this is PE as she is supratherapeutic on her Coumadin. I will go ahead and cover her for potential pneumonia. Continue BiPAP support and monitor for any decompensation. Repeat chest x-ray in the morning. Consider CT scan of the chest howeve with have to do it without contrast because for renal function. We'll also ask for pulmonary input. 2. Stage II chronic renal disease. Baseline creatinine is 1.5. She's just a little bit today. We'll recheck this in the morning. 3. Leukocytosis. White count was over 20,000. This may support pneumonia however could be stressed emargination. Repeat CBC in the morning. 4. Anemia. We'll monitor for any bleeding. Try to 5 baseline hemoglobin. 5. Atrial fibrillation. Rate is controlled we'll continue current medications. She is supratherapeutic on her INR. 6. Coagulopathy. INR 3.2. She tells me this down from 5 a few days ago. I'll hold her Coumadin this evening and check an INR in the morning. We'll need to restart before she is subtherapeutic. Next line Total critical care time spent 45 minutes  Baxter Hire, MD 01/26/2017, 2:50 PM

## 2017-01-26 NOTE — Anesthesia Preprocedure Evaluation (Signed)
Anesthesia Evaluation  Patient identified by MRN, date of birth, ID band Patient awake    Reviewed: Allergy & Precautions, H&P , NPO status , Patient's Chart, lab work & pertinent test results, reviewed documented beta blocker date and time   History of Anesthesia Complications Negative for: history of anesthetic complications  Airway Mallampati: I  TM Distance: >3 FB Neck ROM: full    Dental  (+) Caps, Missing, Poor Dentition, Dental Advidsory Given   Pulmonary shortness of breath and with exertion, sleep apnea , neg COPD, neg recent URI, former smoker,           Cardiovascular Exercise Tolerance: Good hypertension, (-) angina(-) CAD, (-) Past MI, (-) Cardiac Stents and (-) CABG Normal cardiovascular exam+ dysrhythmias Atrial Fibrillation (-) Valvular Problems/Murmurs     Neuro/Psych negative neurological ROS  negative psych ROS   GI/Hepatic Neg liver ROS, GERD  Medicated,  Endo/Other  diabetesHypothyroidism Morbid obesity  Renal/GU CRFRenal disease  negative genitourinary   Musculoskeletal   Abdominal   Peds  Hematology  (+) Blood dyscrasia, anemia ,   Anesthesia Other Findings Past Medical History: No date: Anemia No date: Anxiety No date: Chronic kidney disease     Comment: INSUFFICIENCY No date: Diabetes mellitus without complication (H[facility]) No date: Dyspnea     Comment: DOE No date: Dysrhythmia     Comment: A FIB No date: Edema No date: GERD (gastroesophageal reflux disease) No date: History of kidney stones No date: Hypertension No date: Hypothyroidism     Comment: ABLATION No date: Neuropathy No date: Orthopnea No date: Sleep apnea     Comment: CPAP No date: Vertigo No date: Wheezing   Reproductive/Obstetrics negative OB ROS                             Anesthesia Physical Anesthesia Plan  ASA: III  Anesthesia Plan: MAC   Post-op Pain Management:    Induction:    PONV Risk Score and Plan: 2 and Ondansetron  Airway Management Planned: Natural Airway  Additional Equipment:   Intra-op Plan:   Post-operative Plan:   Informed Consent: I have reviewed the patients History and Physical, chart, labs and discussed the procedure including the risks, benefits and alternatives for the proposed anesthesia with the patient or authorized representative who has indicated his/her understanding and acceptance.   Dental Advisory Given  Plan Discussed with: Anesthesiologist, CRNA and Surgeon  Anesthesia Plan Comments:         Anesthesia Quick Evaluation

## 2017-01-26 NOTE — Progress Notes (Signed)
Dr Kayleen Memos aware of tropinin 0.03

## 2017-01-26 NOTE — Consult Note (Signed)
.   Name: Andrea Rivers MRN: [id number] DOB: 27-Jan-1956    ADMISSION DATE:  01/26/2017 CONSULTATION DATE: 01/26/2017  REFERRING MD :  Dr. Wynetta Emery   CHIEF COMPLAINT: S/P left cataract repair   BRIEF PATIENT DESCRIPTION:  61 yo female admitted 06/20 s/p left cataract repair with acute hypoxic respiratory failure secondary to ?CAP vs. pulmonary edema requiring Bipap   SIGNIFICANT EVENTS  06/20-Pt admitted to [facility] Unit   STUDIES:  None   HISTORY OF PRESENT ILLNESS:   This is a 61 yo female with a PMH of Chronic Afibb on coumadin, Former Smoker, OSA, Orthopnea, Neuropathy, Hypothyroidism, HTN, Kidney Stones, GERD, Edema, Dysrhythmia, Diabetes Mellitus, CKD, Anxiety, and Anemia.  She presented to [facility] 06/20 for an elective left cataract repair.  During surgery the pt became hypoxic and per OR notes the anesthesiologist stated the pt was having a panic attack requiring mechanical intubation.  The pt stated she has had panic attacks in the past when she lies flat.  She was extubated post procedure and placed on Bipap in the PACU.  She received 10 mg iv lasix x1 dose in the PACU with uop 500 ml attempted a trial off Bipap, however due to persistent hypoxia O2 sats 70% she was placed back on Bipap.  Therefore, she was admitted to the [facility] Unit by surgical team and PCCM consulted.    PAST MEDICAL HISTORY :   has a past medical history of Anemia; Anxiety; Chronic kidney disease; Diabetes mellitus without complication ([place]); Dyspnea; Dysrhythmia; Edema; GERD (gastroesophageal reflux disease); History of kidney stones; Hypertension; Hypothyroidism; Neuropathy; Orthopnea; Sleep apnea; Vertigo; and Wheezing.  has a past surgical history that includes Tonsillectomy and Cholecystectomy. Prior to Admission medications   Medication Sig Start Date End Date Taking? Authorizing Provider  amLODipine-benazepril (LOTREL) 5-20 MG capsule Take 1 capsule by mouth daily.   Yes [provider]    celecoxib (CELEBREX) 200 MG capsule Take 200 mg by mouth daily.   Yes [provider]  citalopram (CELEXA) 20 MG tablet Take 20 mg by mouth daily.   Yes [provider]  glimepiride (AMARYL) 2 MG tablet Take 2 mg by mouth daily with breakfast.   Yes [provider]  insulin glargine (LANTUS) 100 UNIT/ML injection Inject 12 Units into the skin at bedtime.   Yes [provider]  insulin lispro (HUMALOG) 100 UNIT/ML injection Inject 40 Units into the skin 2 (two) times daily as needed for high blood sugar.   Yes [provider]  levothyroxine (SYNTHROID, LEVOTHROID) 175 MCG tablet Take 175 mcg by mouth daily before breakfast.   Yes [provider]  linagliptin (TRADJENTA) 5 MG TABS tablet Take 5 mg by mouth daily.   Yes [provider]  loratadine (CLARITIN) 10 MG tablet Take 10 mg by mouth daily.   Yes [provider]  lovastatin (MEVACOR) 20 MG tablet Take 20 mg by mouth at bedtime.   Yes [provider]  magnesium oxide (MAG-OX) 400 MG tablet Take 400 mg by mouth 2 (two) times daily.   Yes [provider]  meclizine (ANTIVERT) 12.5 MG tablet Take 12.5 mg by mouth.   Yes [provider]  metFORMIN (GLUCOPHAGE) 1000 MG tablet Take 1,000 mg by mouth 2 (two) times daily with a meal.   Yes [provider]  METOPROLOL SUCCINATE ER PO Take 100 mg by mouth every morning.   Yes [provider]  pantoprazole (PROTONIX) 40 MG tablet Take 40 mg by mouth  2 (two) times daily.   Yes [provider]  traMADol (ULTRAM) 50 MG tablet Take 50 mg by mouth 3 (three) times daily as needed.   Yes [provider]  warfarin (COUMADIN) 1 MG tablet Take 0.5 mg by mouth daily at 6 PM.   Yes [provider]  warfarin (COUMADIN) 7.5 MG tablet Take 7.5 mg by mouth daily at 6 PM.   Yes [provider]  OxyCODONE HCl, Abuse Deter, (OXAYDO) 5 MG TABA Take 5 mg by mouth 3 (three)  times daily as needed.    [provider]   Allergies  Allergen Reactions  . Other Anaphylaxis    ILOSONE,  Caused GI distress.    FAMILY HISTORY:  family history is not on file. SOCIAL HISTORY:  reports that she has quit smoking. She has quit using smokeless tobacco. She reports that she does not drink alcohol.  REVIEW OF SYSTEMS: Positives in BOLD  Constitutional: Negative for fever, chills, weight loss, malaise/fatigue and diaphoresis.  HENT: Negative for hearing loss, ear pain, nosebleeds, congestion, sore throat, neck pain, tinnitus and ear discharge.   Eyes: Negative for blurred vision, double vision, photophobia, pain, discharge and redness.  Respiratory: cough, hemoptysis, sputum production, shortness of breath, wheezing and stridor.   Cardiovascular: Negative for chest pain, palpitations, orthopnea, claudication, leg swelling and PND.  Gastrointestinal: Negative for heartburn, nausea, vomiting, abdominal pain, diarrhea, constipation, blood in stool and melena.  Genitourinary: Negative for dysuria, urgency, frequency, hematuria and flank pain.  Musculoskeletal: Negative for myalgias, back pain, joint pain and falls.  Skin: Negative for itching and rash.  Neurological: Negative for dizziness, tingling, tremors, sensory change, speech change, focal weakness, seizures, loss of consciousness, weakness and headaches.  Endo/Heme/Allergies: Negative for environmental allergies and polydipsia. Does not bruise/bleed easily.  SUBJECTIVE:  Pt states she is more swollen in her bilateral lower extremities no other complaints at this time.  VITAL SIGNS: Temp:  [97.1 F (36.2 C)-98 F (36.7 C)] 97.1 F (36.2 C) (06/20 1534) Pulse Rate:  [59-88] 75 (06/20 1613) Resp:  [18-25] 25 (06/20 1613) BP: (103-155)/(46-94) 155/94 (06/20 1613) SpO2:  [77 %-100 %] 96 % (06/20 1613)  PHYSICAL EXAMINATION: General: well developed, well nourished obese Caucasian female, NAD Neuro: alert  and oriented, follows commands, left eye patch s/p cataract repair   HEENT: supple, mild JVD  Cardiovascular: nsr, s1s2, no M/R/G  Lungs: diffuse crackles throughout, even, non labored on Bipap  Abdomen: +BS x4, obese, soft, non tender, non distended  Musculoskeletal: 2+ bilateral lower extremity edema Skin: intact no rashes or lesions    Recent Labs Lab 01/26/17 1337  NA 139  K 4.6  CL 105  CO2 24  BUN 56*  CREATININE 1.67*  GLUCOSE 280*    Recent Labs Lab 01/26/17 1337  HGB 9.1*  HCT 31.2*  WBC 20.8*  PLT 312   Dg Chest Port 1 View  Result Date: 01/26/2017 CLINICAL DATA:  Shortness breath.  History of diabetes and edema. EXAM: PORTABLE CHEST 1 VIEW COMPARISON:  Chest radiograph January 08, 2016 FINDINGS: Cardiac silhouette is mildly enlarged and unchanged. Calcified aortic knob. Interstitial and predominately LEFT lung alveolar airspace opacities. No pleural effusion. No pneumothorax. Bronchitic changes. Soft tissue planes and included osseous structures are nonsuspicious. Large body habitus. IMPRESSION: Interstitial and alveolar airspace opacities can be seen with pulmonary edema or infection. Stable cardiomegaly. Electronically Signed   By: Elon Alas M.D.   On: 01/26/2017 13:34    ASSESSMENT / PLAN:  S/P left cataract repair-06/20 Acute hypoxic respiratory failure secondary to pulmonary edema vs. CAP  Anxiety  Acute on chronic renal failure (baseline creatinine 1.3 to 1.8) Mildly elevated troponin likely demand ischemia Hx: Former Smoker, Anxiety, Atrial Fibrillation, HTN, OSA, GERD, and CKD P: Prn Bipap for dyspnea or to maintain O2 sats >92% Repeat cxr in am Stat 20 mg iv lasix x1 dose  Continue abx  Trend WBC and monitor fever curve Trend PCT Echo pending  Continuous telemetry monitoring Trend troponin's Trend BMP Replace electrolytes as indicated  Monitor uop CBG's ac/hs, lantus, and SSI  Continue outpatient amlodipine, benazepril, pravastatin, and  metoprolol  Marda Stalker, [place] Pager [phone] (please enter 7 digits) PCCM Consult Pager [phone] (please enter 7 digits)  PCCM ATTENDING ATTESTATION:  I evaluated patient with the APP [place] on the day of admission to ICU/SDU, reviewed database in its entirety and discussed care plan in detail. In addition, this patient was discussed on multidisciplinary rounds.   Important exam findings: Well supported on BiPAP Cognition intact Left greater than right crackles No wheezes Irregular, no murmurs Obese, abdomen benign Extremities warm, no edema  Chest x-ray: Asymmetric infiltrates left greater than right consistent with pneumonia versus asymmetric edema  Major problems addressed by PCCM team: Acute hypoxemic respiratory failure - well supported on BiPAP Asymmetric pulmonary infiltrates - edema versus pneumonia   PLAN/REC: Continue noninvasive ventilatory support as needed Empiric antibiotics Diuresis as tolerated by blood pressure and renal function    Merton Border, MD PCCM service Mobile [phone] Pager [phone] 01/27/2017 11:45 AM

## 2017-01-26 NOTE — Progress Notes (Signed)
Pt doing well.

## 2017-01-26 NOTE — Progress Notes (Signed)
Dr Wynetta Emery in to see pt sat on bi pap 98 to 1oo   Pt feels good

## 2017-01-26 NOTE — Progress Notes (Signed)
Dr Rosey Bath in see pt

## 2017-01-26 NOTE — Discharge Instructions (Signed)
Eye Surgery Discharge Instructions  Expect mild scratchy sensation or mild soreness. DO NOT RUB YOUR EYE!  The day of surgery:  Minimal physical activity, but bed rest is not required  No reading, computer work, or close hand work  No bending, lifting, or straining.  May watch TV  For 24 hours:  No driving, legal decisions, or alcoholic beverages  Safety precautions  Eat anything you prefer: It is better to start with liquids, then soup then solid foods.  _____ Eye patch should be worn until postoperative exam tomorrow.  ____ Solar shield eyeglasses should be worn for comfort in the sunlight/patch while sleeping  Resume all regular medications including aspirin or Coumadin if these were discontinued prior to surgery. You may shower, bathe, shave, or wash your hair. Tylenol may be taken for mild discomfort.  Call your doctor if you experience significant pain, nausea, or vomiting, fever > 101 or other signs of infection. [phone] or [phone] Specific instructions:  Follow-up Information    Campbell Kray, Remo Lipps, MD Follow up on 01/27/2017.   Specialty:  Ophthalmology Why:  11:00 Contact information: [address]   [place] [address] [phone]

## 2017-01-26 NOTE — Consult Note (Signed)
Pharmacy Antibiotic Note  Andrea Rivers is a 61 y.o. female admitted on 01/26/2017 with possible aspiration PNA. After cataract surgery  Pharmacy has been consulted for zosyn dosing.  Plan: Zosyn 3.375g IV q8h (4 hour infusion).  Height: 5\' 6"  (167.6 cm) Weight: (!) 302 lb (137 kg) IBW/kg (Calculated) : 59.3  Temp (24hrs), Avg:97.7 F (36.5 C), Min:97.1 F (36.2 C), Max:98 F (36.7 C)   Recent Labs Lab 01/26/17 1337  WBC 20.8*  CREATININE 1.67*    Estimated Creatinine Clearance: 50.5 mL/min (A) (by C-G formula based on SCr of 1.67 mg/dL (H)).    Allergies  Allergen Reactions  . Other Anaphylaxis    ILOSONE,  Caused GI distress.    Antimicrobials this admission: cefuroxamine 6/20 >> one dose-preop zosyn 6/20 >>   Dose adjustments this admission:   Microbiology results:   Thank you for allowing pharmacy to be a part of this patient's care.  Ramond Dial, Pharm.D, BCPS Clinical Pharmacist  01/26/2017 4:48 PM

## 2017-01-26 NOTE — OR Nursing (Signed)
Decreased SAO2 converted from MAC to General anesthesia.

## 2017-01-26 NOTE — Anesthesia Procedure Notes (Signed)
Procedure Name: Intubation Date/Time: 01/26/2017 10:27 AM Performed by: Courtney Paris Pre-anesthesia Checklist: Patient identified, Patient being monitored, Timeout performed, Emergency Drugs available and Suction available Patient Re-evaluated:Patient Re-evaluated prior to inductionOxygen Delivery Method: Circle system utilized Preoxygenation: Pre-oxygenation with 100% oxygen Intubation Type: IV induction Ventilation: Unable to mask ventilate and Oral airway inserted - appropriate to patient size Laryngoscope Size: Mac and 3 Grade View: Grade I Tube type: Oral Tube size: 7.0 mm Number of attempts: 1 Airway Equipment and Method: Stylet Placement Confirmation: ETT inserted through vocal cords under direct vision,  positive ETCO2 and breath sounds checked- equal and bilateral Secured at: 21 cm Tube secured with: Tape Dental Injury: Teeth and Oropharynx as per pre-operative assessment

## 2017-01-26 NOTE — Anesthesia Post-op Follow-up Note (Cosign Needed)
Anesthesia QCDR form completed.        

## 2017-01-26 NOTE — Transfer of Care (Signed)
96.90f Immediate Anesthesia Transfer of Care Note  Patient: Andrea Rivers  Procedure(s) Performed: Procedure(s) with comments: CATARACT EXTRACTION PHACO AND INTRAOCULAR LENS PLACEMENT ([place]) (Left) - [place]   1:02.2 AP     23.7 CDE   28.92 fluid pack lot# [id number] H exp.05/08/2018  Patient Location: PACU  Anesthesia Type:General  Level of Consciousness: awake, drowsy and patient cooperative  Airway & Oxygen Therapy: Patient Spontanous Breathing  Post-op Assessment: Report given to RN and Post -op Vital signs reviewed and stable  Post vital signs: Reviewed and stable  Last Vitals:  Vitals:   01/26/17 0740  BP: 116/61  Pulse: (!) 59  Resp: 20  Temp: 36.7 C    Last Pain:  Vitals:   01/26/17 0740  TempSrc: Oral         Complications: No apparent anesthesia complications

## 2017-01-26 NOTE — Anesthesia Postprocedure Evaluation (Signed)
Anesthesia Post Note  Patient: Andrea Rivers  Procedure(s) Performed: Procedure(s) (LRB): CATARACT EXTRACTION PHACO AND INTRAOCULAR LENS PLACEMENT ([facility]) (Left)  Patient location during evaluation: PACU Anesthesia Type: General Level of consciousness: awake and alert Pain management: pain level controlled Vital Signs Assessment: post-procedure vital signs reviewed and stable Respiratory status: spontaneous breathing, nonlabored ventilation and respiratory function stable (Patient on Bipap) Cardiovascular status: blood pressure returned to baseline and stable Postop Assessment: no signs of nausea or vomiting Anesthetic complications: no Comments: Patient is currently stable on Bipap with an O2 saturation between 93 and 97%.  We are obtaining a chest xray to determine if there is anything anatomically going on that can account for her saturation problems.  Additionally, the hospitalist has been contacted and will be admitting this patient for overnight monitoring.  The location of the monitoring will be determined by whether or not she is able to be weaned from [place].  I am confident that her respiratory status is improving and hopeful that she will be able to wean from bipap prior to leaving the PACU.     Last Vitals:  Vitals:   01/26/17 1246 01/26/17 1300  BP: 124/82 (!) 103/49  Pulse: 71 88  Resp: 20 (!) 24  Temp:      Last Pain:  Vitals:   01/26/17 0740  TempSrc: Oral                 Martha Clan

## 2017-01-26 NOTE — Op Note (Signed)
Date of Surgery: 01/26/2017 Date of Dictation: 01/26/2017 11:08 AM Pre-operative Diagnosis:  Nuclear Sclerotic Cataract left Eye Post-operative Diagnosis: same Procedure performed: Extra-capsular Cataract Extraction (ECCE) with placement of a posterior chamber intraocular lens (IOL) left Eye IOL:  Implant Name Type Inv. Item Serial No. Manufacturer Lot No. LRB No. Used  LENS IOL ACRYSOF IQ 24.0 - [id number] Intraocular Lens LENS IOL ACRYSOF IQ 24.0 [id number] ALCON   Left 1   Anesthesia: 2% Lidocaine and 4% Marcaine in a 50/50 mixture with 10 unites/ml of Hylenex given as a peribulbar Anesthesiologist: Anesthesiologist: Martha Clan, MD CRNA: Courtney Paris, CRNA Complications: none Estimated Blood Loss: less than 1 ml  Description of procedure:  The patient was given anesthesia and sedation via intravenous access. The patient was then prepped and draped in the usual fashion. A 25-gauge needle was bent for initiating the capsulorhexis. A 5-0 silk suture was placed through the conjunctiva superior and inferiorly to serve as bridle sutures. Hemostasis was obtained at the superior limbus using an eraser cautery. A partial thickness groove was made at the anterior surgical limbus with a 64 Beaver blade and this was dissected anteriorly with an [employer]. The anterior chamber was entered at 10 o'clock with a 1.0 mm paracentesis knife and through the lamellar dissection with a 2.6 mm Alcon keratome. Epi-Shugarcaine 0.5 CC [9 cc BSS Plus (Alcon), 3 cc 4% preservative-free lidocaine (Hospira) and 4 cc 1:1000 preservative-free, bisulfite-free epinephrine] was injected into the anterior chamber via the paracentesis tract. Epi-Shugarcaine 0.5 CC [9 cc BSS Plus (Alcon), 3 cc 4% preservative-free lidocaine (Hospira) and 4 cc 1:1000 preservative-free, bisulfite-free epinephrine] was injected into the anterior chamber via the paracentesis tract. DiscoVisc was injected to replace the aqueous and a  continuous tear curvilinear capsulorhexis was performed using a bent 25-gauge needle.  At this point the patient complained that she couldn't breath and she desaturated. The anesthesia team arrived and an LMA was placed. Her oxygenation was insufficient and so the drape was removed and she was intubated. Her oxygenation rose into the 90's and she was placed supine again. I watched during the emergency and the area prepped was not compromised. She was redraped and I prodeeded with the cataract operation without incident.   Balance salt on a syringe was used to perform hydro-dissection and phacoemulsification was carried out using a divide and conquer technique. Procedure(s) with comments: CATARACT EXTRACTION PHACO AND INTRAOCULAR LENS PLACEMENT ([facility]) (Left) - [place]   1:02.2 AP     23.7 CDE   28.92 fluid pack lot# [id number] H exp.05/08/2018. Irrigation/aspiration was used to remove the residual cortex and the capsular bag was inflated with DiscoVisc. The intraocular lens was inserted into the capsular bag using a pre-loaded UltraSert Delivery System. Irrigation/aspiration was used to remove the residual DiscoVisc. The wound was inflated with balanced salt and checked for leaks. None were found. Miostat was injected via the paracentesis track and 0.1 ml of cefuroxime containing 1 mg of drug  was injected via the paracentesis track. The wound was checked for leaks again and none were found.   The bridal sutures were removed and two drops of Vigamox were placed on the eye. A single 10-0 nylon suture was placed across the wound. An eye shield was placed to protect the eye and the patient was discharged to the recovery area in good condition.   Leyah Bocchino MD

## 2017-01-26 NOTE — Progress Notes (Signed)
Prime doctor called   To see pt cxr ordered and lab work

## 2017-01-27 ENCOUNTER — Encounter: Payer: Self-pay | Admitting: Ophthalmology

## 2017-01-27 ENCOUNTER — Inpatient Hospital Stay: Payer: Medicare Other

## 2017-01-27 LAB — GLUCOSE, CAPILLARY
GLUCOSE-CAPILLARY: 240 mg/dL — AB (ref 65–99)
GLUCOSE-CAPILLARY: 209 mg/dL — AB (ref 65–99)
Glucose-Capillary: 157 mg/dL — ABNORMAL HIGH (ref 65–99)
Glucose-Capillary: 173 mg/dL — ABNORMAL HIGH (ref 65–99)
Glucose-Capillary: 246 mg/dL — ABNORMAL HIGH (ref 65–99)

## 2017-01-27 LAB — BASIC METABOLIC PANEL
Anion gap: 6 (ref 5–15)
BUN: 57 mg/dL — AB (ref 6–20)
CHLORIDE: 107 mmol/L (ref 101–111)
CO2: 27 mmol/L (ref 22–32)
CREATININE: 1.56 mg/dL — AB (ref 0.44–1.00)
Calcium: 8.8 mg/dL — ABNORMAL LOW (ref 8.9–10.3)
GFR calc non Af Amer: 35 mL/min — ABNORMAL LOW (ref >60)
GFR, EST AFRICAN AMERICAN: 40 mL/min — AB (ref >60)
Glucose, Bld: 298 mg/dL — ABNORMAL HIGH (ref 65–99)
Potassium: 4.6 mmol/L (ref 3.5–5.1)
SODIUM: 140 mmol/L (ref 135–145)

## 2017-01-27 LAB — CBC
HCT: 27.2 % — ABNORMAL LOW (ref 35.0–47.0)
Hemoglobin: 8.2 g/dL — ABNORMAL LOW (ref 12.0–16.0)
MCH: 20.8 pg — AB (ref 26.0–34.0)
MCHC: 30.3 g/dL — ABNORMAL LOW (ref 32.0–36.0)
MCV: 68.9 fL — AB (ref 80.0–100.0)
PLATELETS: 257 10*3/uL (ref 150–440)
RBC: 3.96 MIL/uL (ref 3.80–5.20)
RDW: 19.7 % — ABNORMAL HIGH (ref 11.5–14.5)
WBC: 12.5 10*3/uL — AB (ref 3.6–11.0)

## 2017-01-27 LAB — PROTIME-INR
INR: 1.81
Prothrombin Time: 21.2 seconds — ABNORMAL HIGH (ref 11.4–15.2)

## 2017-01-27 LAB — TROPONIN I

## 2017-01-27 MED ORDER — WARFARIN SODIUM 6 MG PO TABS
8.0000 mg | ORAL_TABLET | Freq: Every day | ORAL | Status: DC
  Administered 2017-01-27: 18:00:00 8 mg via ORAL
  Filled 2017-01-27: qty 1

## 2017-01-27 MED ORDER — DIFLUPREDNATE 0.05 % OP EMUL
1.0000 [drp] | OPHTHALMIC | Status: DC
  Administered 2017-01-27 – 2017-01-28 (×3): 1 [drp] via OPHTHALMIC

## 2017-01-27 MED ORDER — TRAMADOL HCL 50 MG PO TABS
50.0000 mg | ORAL_TABLET | Freq: Three times a day (TID) | ORAL | Status: DC | PRN
  Administered 2017-01-27: 50 mg via ORAL
  Filled 2017-01-27: qty 1

## 2017-01-27 MED ORDER — BENAZEPRIL HCL 20 MG PO TABS
20.0000 mg | ORAL_TABLET | Freq: Every day | ORAL | Status: DC
  Administered 2017-01-27 – 2017-01-28 (×2): 20 mg via ORAL
  Filled 2017-01-27 (×2): qty 1

## 2017-01-27 MED ORDER — AMOXICILLIN-POT CLAVULANATE 875-125 MG PO TABS
1.0000 | ORAL_TABLET | Freq: Two times a day (BID) | ORAL | Status: DC
  Administered 2017-01-27 – 2017-01-28 (×3): 1 via ORAL
  Filled 2017-01-27 (×4): qty 1

## 2017-01-27 MED ORDER — LEVOTHYROXINE SODIUM 175 MCG PO TABS
175.0000 ug | ORAL_TABLET | Freq: Every day | ORAL | Status: DC
  Administered 2017-01-27: 175 ug via ORAL
  Filled 2017-01-27 (×2): qty 1

## 2017-01-27 MED ORDER — AMLODIPINE BESY-BENAZEPRIL HCL 5-20 MG PO CAPS: 1.0000 | ORAL_CAPSULE | Freq: Every day | ORAL | Status: DC

## 2017-01-27 MED ORDER — AMLODIPINE BESYLATE 5 MG PO TABS
5.0000 mg | ORAL_TABLET | Freq: Every day | ORAL | Status: DC
  Administered 2017-01-27 – 2017-01-28 (×2): 5 mg via ORAL
  Filled 2017-01-27 (×2): qty 1

## 2017-01-27 MED ORDER — METFORMIN HCL 500 MG PO TABS
1000.0000 mg | ORAL_TABLET | Freq: Two times a day (BID) | ORAL | Status: DC
  Administered 2017-01-27 – 2017-01-28 (×2): 1000 mg via ORAL
  Filled 2017-01-27 (×3): qty 2

## 2017-01-27 MED ORDER — MOXIFLOXACIN HCL 0.5 % OP SOLN
1.0000 [drp] | Freq: Four times a day (QID) | OPHTHALMIC | Status: DC
  Administered 2017-01-27 – 2017-01-28 (×4): 1 [drp] via OPHTHALMIC
  Filled 2017-01-27: qty 3

## 2017-01-27 MED ORDER — NEPAFENAC 0.3 % OP SUSP
1.0000 [drp] | OPHTHALMIC | Status: DC
  Administered 2017-01-27: 1 [drp] via OPHTHALMIC

## 2017-01-27 MED ORDER — WARFARIN - PHARMACIST DOSING INPATIENT: Freq: Every day | Status: DC

## 2017-01-27 MED ORDER — FUROSEMIDE 40 MG PO TABS
40.0000 mg | ORAL_TABLET | Freq: Every day | ORAL | Status: DC
  Administered 2017-01-27 – 2017-01-28 (×2): 40 mg via ORAL
  Filled 2017-01-27 (×2): qty 1

## 2017-01-27 NOTE — Progress Notes (Signed)
No distress on nasal cannula oxygen. No complaints  Vitals:   01/27/17 1000 01/27/17 1100 01/27/17 1133 01/27/17 1140  BP: (!) 141/113  (!) 119/101 (!) 119/101  Pulse: 71 (!) 107 80   Resp: 15 (!) 25    Temp:      TempSrc:      SpO2: 98% 95%    Weight:      Height:        NAD HEENT: Left eye patch. Otherwise WNL JVP cannot be visualized No wheezes, mild bibasilar crackles Irregular, no murmurs noted (sinus arrhythmia with PACs, PVCs) Obese, soft, NT Extremities warm without edema  BMP Latest Ref Rng & Units 01/27/2017 01/26/2017  Glucose 65 - 99 mg/dL 298(H) 280(H)  BUN 6 - 20 mg/dL 57(H) 56(H)  Creatinine 0.44 - 1.00 mg/dL 1.56(H) 1.67(H)  Sodium 135 - 145 mmol/L 140 139  Potassium 3.5 - 5.1 mmol/L 4.6 4.6  Chloride 101 - 111 mmol/L 107 105  CO2 22 - 32 mmol/L 27 24  Calcium 8.9 - 10.3 mg/dL 8.8(L) 8.8(L)    CBC Latest Ref Rng & Units 01/27/2017 01/26/2017  WBC 3.6 - 11.0 K/uL 12.5(H) 20.8(H)  Hemoglobin 12.0 - 16.0 g/dL 8.2(L) 9.1(L)  Hematocrit 35.0 - 47.0 % 27.2(L) 31.2(L)  Platelets 150 - 440 K/uL 257 312     CXR: Improving infiltrates. Pattern suggestive of mild interstitial edema with pulmonary vascular congestion  IMPRESSION: Acute hypoxemic respiratory failure  Infiltrates have cleared substantially since yesterday  Suspect edema, less likely infectious History of obstructive sleep apnea Chronic PAF Type 2 diabetes  PLAN/REC: Supplemental oxygen to maintain SPO2 greater than 90% Change antibiotics to Augmentin and complete 5 more days Nocturnal BiPAP Transfer to telemetry Note: Dr. Ubaldo Glassing is her cardiologist Resume oral diabetes medications  After transfer, PCCM will sign off. Please call if we can be of further assistance    Merton Border, MD PCCM service Mobile [phone] Pager [phone] 01/27/2017 11:53 AM

## 2017-01-27 NOTE — Progress Notes (Signed)
Pt transported to room 112 by [place], [place]. All belongings with patient. Pt is in stable condition at this time. No complaints of pain. Report called to Maddie, RN.

## 2017-01-27 NOTE — Progress Notes (Signed)
RN has indicated bipap does not want to wear bipap

## 2017-01-27 NOTE — Progress Notes (Signed)
cpap declined 

## 2017-01-27 NOTE — Progress Notes (Signed)
[facility] [place] NAME: Andrea Rivers    MR#:  [id number]  DATE OF BIRTH:  19-Aug-1955  SUBJECTIVE:  CHIEF COMPLAINT:  No chief complaint on file. Feeling much better, sitting in the chair, requesting on her home medications also requesting something for eye pain.  REVIEW OF SYSTEMS:  Review of Systems  Constitutional: Negative for chills, fever and weight loss.  HENT: Negative for nosebleeds and sore throat.   Eyes: Negative for blurred vision.  Respiratory: Negative for cough, shortness of breath and wheezing.   Cardiovascular: Negative for chest pain, orthopnea, leg swelling and PND.  Gastrointestinal: Negative for abdominal pain, constipation, diarrhea, heartburn, nausea and vomiting.  Genitourinary: Negative for dysuria and urgency.  Musculoskeletal: Negative for back pain.  Skin: Negative for rash.  Neurological: Negative for dizziness, speech change, focal weakness and headaches.  Endo/Heme/Allergies: Does not bruise/bleed easily.  Psychiatric/Behavioral: Negative for depression.    DRUG ALLERGIES:   Allergies  Allergen Reactions  . Other Anaphylaxis    ILOSONE,  Caused GI distress.   VITALS:  Blood pressure 125/76, pulse 66, temperature 97.8 F (36.6 C), temperature source Oral, resp. rate 18, height 5' 5.5" (1.664 m), weight 135.8 kg (299 lb 6.2 oz), SpO2 98 %. PHYSICAL EXAMINATION:  Physical Exam  Constitutional: She is oriented to person, place, and time and well-developed, well-nourished, and in no distress.  HENT:  Head: Normocephalic and atraumatic.  Eyes: Conjunctivae and EOM are normal. Pupils are equal, round, and reactive to light.  Neck: Normal range of motion. Neck supple. No tracheal deviation present. No thyromegaly present.  Cardiovascular: Normal rate, regular rhythm and normal heart sounds.   Pulmonary/Chest: Effort normal and breath sounds normal. No respiratory distress. She has no wheezes.  She exhibits no tenderness.  Abdominal: Soft. Bowel sounds are normal. She exhibits no distension. There is no tenderness.  Musculoskeletal: Normal range of motion.  Neurological: She is alert and oriented to person, place, and time. No cranial nerve deficit.  Skin: Skin is warm and dry. No rash noted.  Psychiatric: Mood and affect normal.   LABORATORY PANEL:  Female CBC  Recent Labs Lab 01/27/17 0254  WBC 12.5*  HGB 8.2*  HCT 27.2*  PLT 257   ------------------------------------------------------------------------------------------------------------------ Chemistries   Recent Labs Lab 01/27/17 0254  NA 140  K 4.6  CL 107  CO2 27  GLUCOSE 298*  BUN 57*  CREATININE 1.56*  CALCIUM 8.8*   RADIOLOGY:  Dg Chest 1 View  Result Date: 01/27/2017 CLINICAL DATA:  Initial evaluation for acute hypoxia. EXAM: CHEST 1 VIEW COMPARISON:  Prior radiograph from 01/26/2017. FINDINGS: Stable cardiomegaly.  Mediastinal silhouette within normal limits. Lungs normally inflated. Diffuse pulmonary vascular congestion with interstitial edema, improved from previous. No definite pleural effusion. Slight asymmetric opacity within the left lung may reflect edema and/ or infiltrate No pneumothorax. Osseous structures unchanged. IMPRESSION: 1. Stable cardiomegaly with persistent but improved pulmonary edema as compared 01/26/2017. 2. Persistent slight asymmetric opacity within the left lung as compared to the right. While this may be related to edema, underlying infiltrate could be considered in the correct clinical setting. Electronically Signed   By: Jeannine Boga M.D.   On: 01/27/2017 05:50   Dg Chest 2 View  Result Date: 01/27/2017 CLINICAL DATA:  Shortness of breath and pulmonary edema EXAM: CHEST  2 VIEW COMPARISON:  01/27/2017 and prior exams FINDINGS: Cardiomegaly with pulmonary edema is unchanged. No  definite pleural effusions noted. There is no evidence of pneumothorax or acute bony  abnormality. IMPRESSION: No significant change in cardiomegaly and pulmonary edema. Electronically Signed   By: Margarette Canada M.D.   On: 01/27/2017 10:38   ASSESSMENT AND PLAN:   1. Acute respiratory failure. At this point etiology is unclear. Her chest x-ray is not very impressive may be a left lower lobe infiltrate versus asymmetric edema.  - Infiltrates have cleared substantially since before. Suspect edema, less likely infectious -Pulmonary recommends covering her with 5 days course of Augmentin on discharge  2. Stage II chronic renal disease. Baseline creatinine is 1.5. -Close to her baseline we will monitor  3. Leukocytosis. White count was over 20,000. This may support pneumonia however could be stressed emargination. Repeat CBC in the morning.  4. Anemia. We'll monitor for any bleeding. Stable for now.  5. Atrial fibrillation. Rate is controlled we'll continue current medications.  Resume Coumadin   6. Coagulopathy. INR 3.2 On admission -It is 1.81 today we will restart Coumadin    All the records are reviewed and case discussed with Care Management/Social Worker. Management plans discussed with the patient, nursing and they are in agreement.  CODE STATUS: Full Code  TOTAL TIME TAKING CARE OF THIS PATIENT: 35 minutes.   More than 50% of the time was spent in counseling/coordination of care: YES  POSSIBLE D/C IN 1-2 DAYS, DEPENDING ON CLINICAL CONDITION.   Max Sane M.D on 01/27/2017 at 4:53 PM  Between 7am to 6pm - Pager - [phone]  After 6pm go to [email or web address] - Proofreader  [facility]  Office  [phone]  CC: Primary care physician; Sallee Lange, NP  Note: This dictation was prepared with Dragon dictation along with smaller phrase technology. Any transcriptional errors that result from this process are unintentional.

## 2017-01-27 NOTE — Progress Notes (Signed)
ANTICOAGULATION CONSULT NOTE - Initial Consult  Pharmacy Consult for warfarin Indication: AF  Allergies  Allergen Reactions  . Other Anaphylaxis    ILOSONE,  Caused GI distress.    Patient Measurements: Height: 5' 5.5" (166.4 cm) Weight: 299 lb 6.2 oz (135.8 kg) IBW/kg (Calculated) : 58.15 Heparin Dosing Weight:   Vital Signs: Temp: 97.8 F (36.6 C) (06/21 1214) Temp Source: Oral (06/21 1214) BP: 125/76 (06/21 1214) Pulse Rate: 66 (06/21 1214)  Labs:  Recent Labs  01/26/17 0739 01/26/17 1337 01/26/17 2050 01/27/17 0254  HGB  --  9.1*  --  8.2*  HCT  --  31.2*  --  27.2*  PLT  --  312  --  257  LABPROT 25.7*  --   --  21.2*  INR 2.30  --   --  1.81  CREATININE  --  1.67*  --  1.56*  TROPONINI  --  0.03* <0.03 <0.03    Estimated Creatinine Clearance: 53.3 mL/min (A) (by C-G formula based on SCr of 1.56 mg/dL (H)).   Medical History: Past Medical History:  Diagnosis Date  . Anemia   . Anxiety   . Chronic kidney disease    INSUFFICIENCY  . Diabetes mellitus without complication ([place])   . Dyspnea    DOE  . Dysrhythmia    A FIB  . Edema   . GERD (gastroesophageal reflux disease)   . History of kidney stones   . Hypertension   . Hypothyroidism    ABLATION  . Neuropathy   . Orthopnea   . Sleep apnea    CPAP  . Vertigo   . Wheezing     Medications:  Infusions:  . sodium chloride      Assessment: 60 yof cc hypoxia sp cataract repair. She had to be intubated. History of AF on VKA PTA. Pharmacy consulted to manage VKA while IP.   Date  INR  Dose 6/20  2.3  Held 6/21  1.81  8 mg  Goal of Therapy:  INR 2-3 Monitor platelets by anticoagulation protocol: Yes   Plan:  Resume home dosing warfarin 8 mg po daily. Will monitor INR daily.  Laural Benes, Pharm.D., BCPS Clinical Pharmacist 01/27/2017,2:03 PM

## 2017-01-28 LAB — BASIC METABOLIC PANEL
Anion gap: 7 (ref 5–15)
BUN: 54 mg/dL — ABNORMAL HIGH (ref 6–20)
CHLORIDE: 106 mmol/L (ref 101–111)
CO2: 27 mmol/L (ref 22–32)
CREATININE: 1.84 mg/dL — AB (ref 0.44–1.00)
Calcium: 9 mg/dL (ref 8.9–10.3)
GFR calc non Af Amer: 28 mL/min — ABNORMAL LOW (ref >60)
GFR, EST AFRICAN AMERICAN: 33 mL/min — AB (ref >60)
Glucose, Bld: 112 mg/dL — ABNORMAL HIGH (ref 65–99)
POTASSIUM: 4.1 mmol/L (ref 3.5–5.1)
Sodium: 140 mmol/L (ref 135–145)

## 2017-01-28 LAB — CBC
HEMATOCRIT: 26.3 % — AB (ref 35.0–47.0)
HEMOGLOBIN: 7.9 g/dL — AB (ref 12.0–16.0)
MCH: 20.4 pg — AB (ref 26.0–34.0)
MCHC: 29.9 g/dL — ABNORMAL LOW (ref 32.0–36.0)
MCV: 68.2 fL — AB (ref 80.0–100.0)
PLATELETS: 257 10*3/uL (ref 150–440)
RBC: 3.86 MIL/uL (ref 3.80–5.20)
RDW: 19.7 % — ABNORMAL HIGH (ref 11.5–14.5)
WBC: 14.7 10*3/uL — ABNORMAL HIGH (ref 3.6–11.0)

## 2017-01-28 LAB — PROCALCITONIN: Procalcitonin: 0.15 ng/mL

## 2017-01-28 LAB — BRAIN NATRIURETIC PEPTIDE: B Natriuretic Peptide: 678 pg/mL — ABNORMAL HIGH (ref 0.0–100.0)

## 2017-01-28 LAB — PROTIME-INR
INR: 1.89
Prothrombin Time: 22 seconds — ABNORMAL HIGH (ref 11.4–15.2)

## 2017-01-28 LAB — GLUCOSE, CAPILLARY: GLUCOSE-CAPILLARY: 172 mg/dL — AB (ref 65–99)

## 2017-01-28 MED ORDER — AMOXICILLIN-POT CLAVULANATE 875-125 MG PO TABS: 1.0000 | ORAL_TABLET | Freq: Two times a day (BID) | ORAL | 0 refills | Status: DC

## 2017-01-28 MED ORDER — MOXIFLOXACIN HCL 0.5 % OP SOLN: 1.0000 [drp] | Freq: Four times a day (QID) | OPHTHALMIC | 0 refills | Status: DC

## 2017-01-28 MED ORDER — DIFLUPREDNATE 0.05 % OP EMUL: 1.0000 [drp] | Freq: Every evening | OPHTHALMIC | 0 refills | Status: DC

## 2017-01-28 MED ORDER — NEPAFENAC 0.3 % OP SUSP: 1.0000 [drp] | Freq: Every day | OPHTHALMIC | 0 refills | Status: DC

## 2017-01-28 NOTE — Progress Notes (Signed)
ANTICOAGULATION CONSULT NOTE - Initial Consult  Pharmacy Consult for warfarin Indication: AF  Allergies  Allergen Reactions  . Other Anaphylaxis    ILOSONE,  Caused GI distress.    Patient Measurements: Height: 5' 5.5" (166.4 cm) Weight: 299 lb 6.2 oz (135.8 kg) IBW/kg (Calculated) : 58.15 Heparin Dosing Weight:   Vital Signs: Temp: 97.9 F (36.6 C) (06/22 0432) Temp Source: Oral (06/22 0432) BP: 121/58 (06/22 0432) Pulse Rate: 70 (06/22 0432)  Labs:  Recent Labs  01/26/17 0739  01/26/17 1337 01/26/17 2050 01/27/17 0254 01/28/17 0452  HGB  --   < > 9.1*  --  8.2* 7.9*  HCT  --   --  31.2*  --  27.2* 26.3*  PLT  --   --  312  --  257 257  LABPROT 25.7*  --   --   --  21.2* 22.0*  INR 2.30  --   --   --  1.81 1.89  CREATININE  --   --  1.67*  --  1.56* 1.84*  TROPONINI  --   --  0.03* <0.03 <0.03  --   < > = values in this interval not displayed.  Estimated Creatinine Clearance: 45.2 mL/min (A) (by C-G formula based on SCr of 1.84 mg/dL (H)).   Medical History: Past Medical History:  Diagnosis Date  . Anemia   . Anxiety   . Chronic kidney disease    INSUFFICIENCY  . Diabetes mellitus without complication ([place])   . Dyspnea    DOE  . Dysrhythmia    A FIB  . Edema   . GERD (gastroesophageal reflux disease)   . History of kidney stones   . Hypertension   . Hypothyroidism    ABLATION  . Neuropathy   . Orthopnea   . Sleep apnea    CPAP  . Vertigo   . Wheezing     Medications:  Infusions:  . sodium chloride      Assessment: 60 yof cc hypoxia sp cataract repair. She had to be intubated. History of AF on VKA PTA. Pharmacy consulted to manage VKA while IP.   Date  INR  Dose 6/20  2.3  Held 6/21  1.81  8 mg 6/22  1.89  8 mg  Goal of Therapy:  INR 2-3 Monitor platelets by anticoagulation protocol: Yes   Plan:  Continue home dosing warfarin 8 mg po daily. Will monitor INR daily.  Laural Benes, Pharm.D., BCPS Clinical  Pharmacist 01/28/2017,8:55 AM

## 2017-01-28 NOTE — Progress Notes (Signed)
Patient discharged home per MD order. All discharge instructions given and all questions answered. Patient verbalized understanding of all discharge instructions.

## 2017-01-29 NOTE — Discharge Summary (Signed)
[facility] NAME: Andrea Rivers    MR#:  [id number]  DATE OF BIRTH:  [birth year]  DATE OF ADMISSION:  01/26/2017   ADMITTING PHYSICIAN: Estill Cotta, MD  DATE OF DISCHARGE: 01/28/2017  9:29 AM  PRIMARY CARE PHYSICIAN: Dayton Martes Victoriano Lain, NP   ADMISSION DIAGNOSIS:  CATARACT DISCHARGE DIAGNOSIS:  Active Problems:   Acute respiratory failure ([place])  SECONDARY DIAGNOSIS:   Past Medical History:  Diagnosis Date  . Anemia   . Anxiety   . Chronic kidney disease    INSUFFICIENCY  . Diabetes mellitus without complication ([place])   . Dyspnea    DOE  . Dysrhythmia    A FIB  . Edema   . GERD (gastroesophageal reflux disease)   . History of kidney stones   . Hypertension   . Hypothyroidism    ABLATION  . Neuropathy   . Orthopnea   . Sleep apnea    CPAP  . Vertigo   . Wheezing    HOSPITAL COURSE:  1. Acute respiratory failure: At this point etiology is unclear. Her chest x-ray is not very impressive may be a left lower lobe infiltrate versus asymmetric edema. Infiltrates have cleared substantially since before. Suspect edema, less likely infectious Pulmonary recommends covering her with 5 days course of Augmentin on discharge  2. Stage II chronic renal disease. Baseline creatinine is 1.5. -Close to her baseline   3. Leukocytosis: could be stress reaction, can be due to pneumonia/pneumonitis too.  4. Anemia: Stable for now.  5. Atrial fibrillation. Rate is controlled,on coumadin  6. Coagulopathy. INR 3.2 On admission so coumadin was held, INR is is 1.81 on the day of D/C, so it's been restarted  DISCHARGE CONDITIONS:  stable CONSULTS OBTAINED:   DRUG ALLERGIES:   Allergies  Allergen Reactions  . Other Anaphylaxis    ILOSONE,  Caused GI distress.   DISCHARGE MEDICATIONS:   Allergies as of 01/28/2017      Reactions   Other Anaphylaxis   ILOSONE,  Caused GI distress.      Medication List      TAKE these medications   amLODipine-benazepril 5-20 MG capsule Commonly known as:  LOTREL Take 1 capsule by mouth daily.   amoxicillin-clavulanate 875-125 MG tablet Commonly known as:  AUGMENTIN Take 1 tablet by mouth 2 (two) times daily.   celecoxib 200 MG capsule Commonly known as:  CELEBREX Take 200 mg by mouth daily.   citalopram 20 MG tablet Commonly known as:  CELEXA Take 20 mg by mouth daily.   Difluprednate 0.05 % Emul Apply 1 drop to eye Nightly.   glimepiride 2 MG tablet Commonly known as:  AMARYL Take 2 mg by mouth daily with breakfast.   insulin glargine 100 UNIT/ML injection Commonly known as:  LANTUS Inject 12 Units into the skin at bedtime.   insulin lispro 100 UNIT/ML injection Commonly known as:  HUMALOG Inject 40 Units into the skin 2 (two) times daily as needed for high blood sugar.   levothyroxine 175 MCG tablet Commonly known as:  SYNTHROID, LEVOTHROID Take 175 mcg by mouth daily before breakfast.   loratadine 10 MG tablet Commonly known as:  CLARITIN Take 10 mg by mouth daily.   lovastatin 20 MG tablet Commonly known as:  MEVACOR Take 20 mg by mouth at bedtime.   magnesium oxide 400 MG tablet Commonly known as:  MAG-OX Take 400 mg by mouth 2 (two) times daily.   meclizine 12.5  MG tablet Commonly known as:  ANTIVERT Take 12.5 mg by mouth.   metFORMIN 1000 MG tablet Commonly known as:  GLUCOPHAGE Take 1,000 mg by mouth 2 (two) times daily with a meal.   METOPROLOL SUCCINATE ER PO Take 100 mg by mouth every morning.   moxifloxacin 0.5 % ophthalmic solution Commonly known as:  VIGAMOX Place 1 drop into the left eye 4 (four) times daily.   nepafenac 0.3 % ophthalmic suspension Commonly known as:  ILEVRO Place 1 drop into the left eye daily.   OxyCODONE HCl (Abuse Deter) 5 MG Taba Commonly known as:  OXAYDO Take 5 mg by mouth 3 (three) times daily as needed.   pantoprazole 40 MG tablet Commonly known as:  PROTONIX Take 40 mg  by mouth 2 (two) times daily.   TRADJENTA 5 MG Tabs tablet Generic drug:  linagliptin Take 5 mg by mouth daily.   traMADol 50 MG tablet Commonly known as:  ULTRAM Take 50 mg by mouth 3 (three) times daily as needed.   warfarin 7.5 MG tablet Commonly known as:  COUMADIN Take 7.5 mg by mouth daily at 6 PM.   warfarin 1 MG tablet Commonly known as:  COUMADIN Take 0.5 mg by mouth daily at 6 PM.        DISCHARGE INSTRUCTIONS:   DIET:  Regular diet DISCHARGE CONDITION:  Good ACTIVITY:  Activity as tolerated OXYGEN:  Home Oxygen: No.  Oxygen Delivery: room air DISCHARGE LOCATION:  home   If you experience worsening of your admission symptoms, develop shortness of breath, life threatening emergency, suicidal or homicidal thoughts you must seek medical attention immediately by calling 911 or calling your MD immediately  if symptoms less severe.  You Must read complete instructions/literature along with all the possible adverse reactions/side effects for all the Medicines you take and that have been prescribed to you. Take any new Medicines after you have completely understood and accpet all the possible adverse reactions/side effects.   Please note  You were cared for by a hospitalist during your hospital stay. If you have any questions about your discharge medications or the care you received while you were in the hospital after you are discharged, you can call the unit and asked to speak with the hospitalist on call if the hospitalist that took care of you is not available. Once you are discharged, your primary care physician will handle any further medical issues. Please note that NO REFILLS for any discharge medications will be authorized once you are discharged, as it is imperative that you return to your primary care physician (or establish a relationship with a primary care physician if you do not have one) for your aftercare needs so that they can reassess your need for  medications and monitor your lab values.    On the day of Discharge:  VITAL SIGNS:  Blood pressure (!) 121/58, pulse 70, temperature 97.9 F (36.6 C), temperature source Oral, resp. rate 20, height 5' 5.5" (1.664 m), weight 135.8 kg (299 lb 6.2 oz), SpO2 93 %. PHYSICAL EXAMINATION:  GENERAL:  61 y.o.-year-old patient lying in the bed with no acute distress.  EYES: Pupils equal, round, reactive to light and accommodation. No scleral icterus. Extraocular muscles intact.  HEENT: Head atraumatic, normocephalic. Oropharynx and nasopharynx clear.  NECK:  Supple, no jugular venous distention. No thyroid enlargement, no tenderness.  LUNGS: Normal breath sounds bilaterally, no wheezing, rales,rhonchi or crepitation. No use of accessory muscles of respiration.  CARDIOVASCULAR: S1, S2 normal.  No murmurs, rubs, or gallops.  ABDOMEN: Soft, non-tender, non-distended. Bowel sounds present. No organomegaly or mass.  EXTREMITIES: No pedal edema, cyanosis, or clubbing.  NEUROLOGIC: Cranial nerves II through XII are intact. Muscle strength 5/5 in all extremities. Sensation intact. Gait not checked.  PSYCHIATRIC: The patient is alert and oriented x 3.  SKIN: No obvious rash, lesion, or ulcer.  DATA REVIEW:   CBC  Recent Labs Lab 01/28/17 0452  WBC 14.7*  HGB 7.9*  HCT 26.3*  PLT 257    Chemistries   Recent Labs Lab 01/28/17 0452  NA 140  K 4.1  CL 106  CO2 27  GLUCOSE 112*  BUN 54*  CREATININE 1.84*  CALCIUM 9.0    Follow-up Information    Estill Cotta, MD Follow up on 01/27/2017.   Specialty:  Ophthalmology Why:  11:00 Contact information: [place] [address] [phone]        Sallee Lange, NP. Schedule an appointment as soon as possible for a visit in 1 week(s).   Specialty:  Internal Medicine Contact information: [place] [address] [phone]           Management plans discussed with the patient, family  and they are in agreement.  CODE STATUS: Prior   TOTAL TIME TAKING CARE OF THIS PATIENT: 45 minutes.    Max Sane M.D on 01/29/2017 at 7:04 PM  Between 7am to 6pm - Pager - [phone]  After 6pm go to [email or web address] - Proofreader  [facility]  Office  [phone]  CC: Primary care physician; Sallee Lange, NP   Note: This dictation was prepared with Dragon dictation along with smaller phrase technology. Any transcriptional errors that result from this process are unintentional.

## 2017-01-30 LAB — ECHOCARDIOGRAM COMPLETE
Height: 65.5 in
WEIGHTICAEL: 4790.15 [oz_av]

## 2017-01-31 LAB — BLOOD GAS, ARTERIAL
Acid-base deficit: 2.9 mmol/L — ABNORMAL HIGH (ref 0.0–2.0)
Bicarbonate: 23.2 mmol/L (ref 20.0–28.0)
Delivery systems: POSITIVE
EXPIRATORY PAP: 6
FIO2: 0.5
Inspiratory PAP: 12
LHR: 8 {breaths}/min
O2 SAT: 90.5 %
Patient temperature: 37
pCO2 arterial: 45 mmHg (ref 32.0–48.0)
pH, Arterial: 7.32 — ABNORMAL LOW (ref 7.350–7.450)
pO2, Arterial: 65 mmHg — ABNORMAL LOW (ref 83.0–108.0)

## 2017-01-31 MED ORDER — NYSTATIN 100000 UNIT/ML MT SUSP: 5.0000 mL | Freq: Four times a day (QID) | OROMUCOSAL | 0 refills | Status: DC

## 2017-02-22 ENCOUNTER — Emergency Department: Payer: Medicare Other

## 2017-02-22 ENCOUNTER — Encounter: Payer: Self-pay | Admitting: Emergency Medicine

## 2017-02-22 ENCOUNTER — Inpatient Hospital Stay: Payer: Medicare Other

## 2017-02-22 ENCOUNTER — Inpatient Hospital Stay
Admission: EM | Admit: 2017-02-22 | Discharge: 2017-03-03 | DRG: 291 | Disposition: A | Discharge status: Skilled Nursing Facility | Payer: Medicare Other | Attending: Internal Medicine | Admitting: Internal Medicine

## 2017-02-22 DIAGNOSIS — J189 Pneumonia, unspecified organism: Secondary | ICD-10-CM

## 2017-02-22 DIAGNOSIS — N179 Acute kidney failure, unspecified: Secondary | ICD-10-CM | POA: Diagnosis present

## 2017-02-22 DIAGNOSIS — I5033 Acute on chronic diastolic (congestive) heart failure: Secondary | ICD-10-CM | POA: Diagnosis present

## 2017-02-22 DIAGNOSIS — I469 Cardiac arrest, cause unspecified: Secondary | ICD-10-CM | POA: Diagnosis present

## 2017-02-22 DIAGNOSIS — I130 Hypertensive heart and chronic kidney disease with heart failure and stage 1 through stage 4 chronic kidney disease, or unspecified chronic kidney disease: Principal | ICD-10-CM | POA: Diagnosis present

## 2017-02-22 DIAGNOSIS — R579 Shock, unspecified: Secondary | ICD-10-CM | POA: Diagnosis not present

## 2017-02-22 DIAGNOSIS — R791 Abnormal coagulation profile: Secondary | ICD-10-CM | POA: Diagnosis not present

## 2017-02-22 DIAGNOSIS — A419 Sepsis, unspecified organism: Secondary | ICD-10-CM | POA: Diagnosis present

## 2017-02-22 DIAGNOSIS — E039 Hypothyroidism, unspecified: Secondary | ICD-10-CM | POA: Diagnosis present

## 2017-02-22 DIAGNOSIS — I480 Paroxysmal atrial fibrillation: Secondary | ICD-10-CM | POA: Diagnosis present

## 2017-02-22 DIAGNOSIS — I959 Hypotension, unspecified: Secondary | ICD-10-CM | POA: Diagnosis not present

## 2017-02-22 DIAGNOSIS — Z794 Long term (current) use of insulin: Secondary | ICD-10-CM

## 2017-02-22 DIAGNOSIS — E872 Acidosis: Secondary | ICD-10-CM | POA: Diagnosis present

## 2017-02-22 DIAGNOSIS — Z452 Encounter for adjustment and management of vascular access device: Secondary | ICD-10-CM

## 2017-02-22 DIAGNOSIS — E875 Hyperkalemia: Secondary | ICD-10-CM | POA: Diagnosis present

## 2017-02-22 DIAGNOSIS — K219 Gastro-esophageal reflux disease without esophagitis: Secondary | ICD-10-CM | POA: Diagnosis present

## 2017-02-22 DIAGNOSIS — J80 Acute respiratory distress syndrome: Secondary | ICD-10-CM | POA: Diagnosis not present

## 2017-02-22 DIAGNOSIS — E1140 Type 2 diabetes mellitus with diabetic neuropathy, unspecified: Secondary | ICD-10-CM | POA: Diagnosis present

## 2017-02-22 DIAGNOSIS — Z9842 Cataract extraction status, left eye: Secondary | ICD-10-CM

## 2017-02-22 DIAGNOSIS — J9601 Acute respiratory failure with hypoxia: Secondary | ICD-10-CM | POA: Diagnosis present

## 2017-02-22 DIAGNOSIS — R6521 Severe sepsis with septic shock: Secondary | ICD-10-CM | POA: Diagnosis present

## 2017-02-22 DIAGNOSIS — J9602 Acute respiratory failure with hypercapnia: Secondary | ICD-10-CM | POA: Diagnosis not present

## 2017-02-22 DIAGNOSIS — E1121 Type 2 diabetes mellitus with diabetic nephropathy: Secondary | ICD-10-CM | POA: Diagnosis present

## 2017-02-22 DIAGNOSIS — E1165 Type 2 diabetes mellitus with hyperglycemia: Secondary | ICD-10-CM | POA: Diagnosis present

## 2017-02-22 DIAGNOSIS — R001 Bradycardia, unspecified: Secondary | ICD-10-CM | POA: Diagnosis not present

## 2017-02-22 DIAGNOSIS — G4733 Obstructive sleep apnea (adult) (pediatric): Secondary | ICD-10-CM | POA: Diagnosis present

## 2017-02-22 DIAGNOSIS — Z6841 Body Mass Index (BMI) 40.0 and over, adult: Secondary | ICD-10-CM

## 2017-02-22 DIAGNOSIS — G931 Anoxic brain damage, not elsewhere classified: Secondary | ICD-10-CM | POA: Diagnosis present

## 2017-02-22 DIAGNOSIS — J9600 Acute respiratory failure, unspecified whether with hypoxia or hypercapnia: Secondary | ICD-10-CM

## 2017-02-22 DIAGNOSIS — R0902 Hypoxemia: Secondary | ICD-10-CM

## 2017-02-22 DIAGNOSIS — J9690 Respiratory failure, unspecified, unspecified whether with hypoxia or hypercapnia: Secondary | ICD-10-CM

## 2017-02-22 DIAGNOSIS — J440 Chronic obstructive pulmonary disease with acute lower respiratory infection: Secondary | ICD-10-CM | POA: Diagnosis present

## 2017-02-22 DIAGNOSIS — M810 Age-related osteoporosis without current pathological fracture: Secondary | ICD-10-CM | POA: Diagnosis present

## 2017-02-22 DIAGNOSIS — Z79899 Other long term (current) drug therapy: Secondary | ICD-10-CM

## 2017-02-22 DIAGNOSIS — Z7901 Long term (current) use of anticoagulants: Secondary | ICD-10-CM

## 2017-02-22 DIAGNOSIS — R092 Respiratory arrest: Secondary | ICD-10-CM

## 2017-02-22 DIAGNOSIS — R600 Localized edema: Secondary | ICD-10-CM

## 2017-02-22 DIAGNOSIS — I509 Heart failure, unspecified: Secondary | ICD-10-CM

## 2017-02-22 DIAGNOSIS — E1122 Type 2 diabetes mellitus with diabetic chronic kidney disease: Secondary | ICD-10-CM | POA: Diagnosis present

## 2017-02-22 DIAGNOSIS — E785 Hyperlipidemia, unspecified: Secondary | ICD-10-CM | POA: Diagnosis present

## 2017-02-22 DIAGNOSIS — D631 Anemia in chronic kidney disease: Secondary | ICD-10-CM | POA: Diagnosis present

## 2017-02-22 DIAGNOSIS — I4891 Unspecified atrial fibrillation: Secondary | ICD-10-CM | POA: Diagnosis present

## 2017-02-22 DIAGNOSIS — Z4659 Encounter for fitting and adjustment of other gastrointestinal appliance and device: Secondary | ICD-10-CM

## 2017-02-22 DIAGNOSIS — Z87442 Personal history of urinary calculi: Secondary | ICD-10-CM

## 2017-02-22 DIAGNOSIS — Z961 Presence of intraocular lens: Secondary | ICD-10-CM | POA: Diagnosis present

## 2017-02-22 DIAGNOSIS — Z888 Allergy status to other drugs, medicaments and biological substances status: Secondary | ICD-10-CM

## 2017-02-22 DIAGNOSIS — E0781 Sick-euthyroid syndrome: Secondary | ICD-10-CM | POA: Diagnosis present

## 2017-02-22 DIAGNOSIS — N184 Chronic kidney disease, stage 4 (severe): Secondary | ICD-10-CM | POA: Diagnosis present

## 2017-02-22 DIAGNOSIS — Z87891 Personal history of nicotine dependence: Secondary | ICD-10-CM

## 2017-02-22 DIAGNOSIS — Z0189 Encounter for other specified special examinations: Secondary | ICD-10-CM

## 2017-02-22 DIAGNOSIS — M79601 Pain in right arm: Secondary | ICD-10-CM | POA: Diagnosis not present

## 2017-02-22 DIAGNOSIS — R1010 Upper abdominal pain, unspecified: Secondary | ICD-10-CM

## 2017-02-22 DIAGNOSIS — R0602 Shortness of breath: Secondary | ICD-10-CM

## 2017-02-22 DIAGNOSIS — R4189 Other symptoms and signs involving cognitive functions and awareness: Secondary | ICD-10-CM

## 2017-02-22 DIAGNOSIS — R34 Anuria and oliguria: Secondary | ICD-10-CM | POA: Diagnosis present

## 2017-02-22 HISTORY — DX: Chronic obstructive pulmonary disease, unspecified: J44.9

## 2017-02-22 LAB — BASIC METABOLIC PANEL
ANION GAP: 12 (ref 5–15)
ANION GAP: 9 (ref 5–15)
Anion gap: 11 (ref 5–15)
BUN: 43 mg/dL — ABNORMAL HIGH (ref 6–20)
BUN: 49 mg/dL — ABNORMAL HIGH (ref 6–20)
BUN: 51 mg/dL — ABNORMAL HIGH (ref 6–20)
CALCIUM: 7.9 mg/dL — AB (ref 8.9–10.3)
CALCIUM: 7.7 mg/dL — AB (ref 8.9–10.3)
CHLORIDE: 106 mmol/L (ref 101–111)
CHLORIDE: 105 mmol/L (ref 101–111)
CHLORIDE: 102 mmol/L (ref 101–111)
CO2: 20 mmol/L — AB (ref 22–32)
CO2: 23 mmol/L (ref 22–32)
CO2: 22 mmol/L (ref 22–32)
CREATININE: 2.16 mg/dL — AB (ref 0.44–1.00)
CREATININE: 2.34 mg/dL — AB (ref 0.44–1.00)
Calcium: 7.8 mg/dL — ABNORMAL LOW (ref 8.9–10.3)
Creatinine, Ser: 2.32 mg/dL — ABNORMAL HIGH (ref 0.44–1.00)
GFR calc Af Amer: 27 mL/min — ABNORMAL LOW (ref >60)
GFR calc non Af Amer: 23 mL/min — ABNORMAL LOW (ref >60)
GFR calc non Af Amer: 22 mL/min — ABNORMAL LOW (ref >60)
GFR calc non Af Amer: 21 mL/min — ABNORMAL LOW (ref >60)
GFR, EST AFRICAN AMERICAN: 25 mL/min — AB (ref >60)
GFR, EST AFRICAN AMERICAN: 25 mL/min — AB (ref >60)
GLUCOSE: 415 mg/dL — AB (ref 65–99)
Glucose, Bld: 422 mg/dL — ABNORMAL HIGH (ref 65–99)
Glucose, Bld: 390 mg/dL — ABNORMAL HIGH (ref 65–99)
POTASSIUM: 5.8 mmol/L — AB (ref 3.5–5.1)
Potassium: 5.1 mmol/L (ref 3.5–5.1)
Potassium: 5.9 mmol/L — ABNORMAL HIGH (ref 3.5–5.1)
SODIUM: 135 mmol/L (ref 135–145)
Sodium: 138 mmol/L (ref 135–145)
Sodium: 137 mmol/L (ref 135–145)

## 2017-02-22 LAB — BLOOD GAS, ARTERIAL
ACID-BASE DEFICIT: 7.1 mmol/L — AB (ref 0.0–2.0)
Acid-base deficit: 8.5 mmol/L — ABNORMAL HIGH (ref 0.0–2.0)
BICARBONATE: 17.9 mmol/L — AB (ref 20.0–28.0)
Bicarbonate: 20.7 mmol/L (ref 20.0–28.0)
FIO2: 1
FIO2: 1
LHR: 35 {breaths}/min
MECHVT: 550 mL
O2 SAT: 87.8 %
O2 Saturation: 76.2 %
PATIENT TEMPERATURE: 37
PCO2 ART: 40 mmHg (ref 32.0–48.0)
PCO2 ART: 53 mmHg — AB (ref 32.0–48.0)
PEEP/CPAP: 10 cmH2O
PEEP/CPAP: 16 cmH2O
PH ART: 7.26 — AB (ref 7.350–7.450)
PH ART: 7.2 — AB (ref 7.350–7.450)
PO2 ART: 63 mmHg — AB (ref 83.0–108.0)
Patient temperature: 37
RATE: 30 resp/min
VT: 360 mL
pO2, Arterial: 51 mmHg — ABNORMAL LOW (ref 83.0–108.0)

## 2017-02-22 LAB — CBC WITH DIFFERENTIAL/PLATELET
Basophils Absolute: 0.2 10*3/uL — ABNORMAL HIGH (ref 0–0.1)
Basophils Relative: 1 %
Eosinophils Absolute: 0.2 10*3/uL (ref 0–0.7)
Eosinophils Relative: 1 %
HEMATOCRIT: 30.2 % — AB (ref 35.0–47.0)
Hemoglobin: 8.6 g/dL — ABNORMAL LOW (ref 12.0–16.0)
LYMPHS ABS: 2.7 10*3/uL (ref 1.0–3.6)
Lymphocytes Relative: 17 %
MCH: 20.8 pg — AB (ref 26.0–34.0)
MCHC: 28.5 g/dL — AB (ref 32.0–36.0)
MCV: 72.9 fL — AB (ref 80.0–100.0)
MONO ABS: 1.4 10*3/uL — AB (ref 0.2–0.9)
MONOS PCT: 9 %
NEUTROS ABS: 11.6 10*3/uL — AB (ref 1.4–6.5)
Neutrophils Relative %: 72 %
PLATELETS: 373 10*3/uL (ref 150–440)
RBC: 4.14 MIL/uL (ref 3.80–5.20)
RDW: 20.7 % — AB (ref 11.5–14.5)
WBC: 16.1 10*3/uL — ABNORMAL HIGH (ref 3.6–11.0)

## 2017-02-22 LAB — GLUCOSE, CAPILLARY
GLUCOSE-CAPILLARY: 330 mg/dL — AB (ref 65–99)
GLUCOSE-CAPILLARY: 373 mg/dL — AB (ref 65–99)
Glucose-Capillary: 309 mg/dL — ABNORMAL HIGH (ref 65–99)
Glucose-Capillary: 407 mg/dL — ABNORMAL HIGH (ref 65–99)
Glucose-Capillary: 414 mg/dL — ABNORMAL HIGH (ref 65–99)

## 2017-02-22 LAB — TROPONIN I
Troponin I: 0.04 ng/mL (ref <0.03)
Troponin I: 0.08 ng/mL (ref <0.03)
Troponin I: 0.09 ng/mL (ref <0.03)

## 2017-02-22 LAB — URINALYSIS, ROUTINE W REFLEX MICROSCOPIC
BILIRUBIN URINE: NEGATIVE
Glucose, UA: >=500 mg/dL — AB
Hgb urine dipstick: NEGATIVE
Ketones, ur: NEGATIVE mg/dL
LEUKOCYTES UA: NEGATIVE
Nitrite: NEGATIVE
Protein, ur: 100 mg/dL — AB
SPECIFIC GRAVITY, URINE: 1.011 (ref 1.005–1.030)
pH: 5 (ref 5.0–8.0)

## 2017-02-22 LAB — COMPREHENSIVE METABOLIC PANEL
ALBUMIN: 3.5 g/dL (ref 3.5–5.0)
ALT: 17 U/L (ref 14–54)
AST: 38 U/L (ref 15–41)
Alkaline Phosphatase: 87 U/L (ref 38–126)
Anion gap: 16 — ABNORMAL HIGH (ref 5–15)
BILIRUBIN TOTAL: 0.5 mg/dL (ref 0.3–1.2)
BUN: 37 mg/dL — AB (ref 6–20)
CHLORIDE: 103 mmol/L (ref 101–111)
CO2: 20 mmol/L — ABNORMAL LOW (ref 22–32)
CREATININE: 2.11 mg/dL — AB (ref 0.44–1.00)
Calcium: 8.6 mg/dL — ABNORMAL LOW (ref 8.9–10.3)
GFR calc Af Amer: 28 mL/min — ABNORMAL LOW (ref >60)
GFR calc non Af Amer: 24 mL/min — ABNORMAL LOW (ref >60)
GLUCOSE: 356 mg/dL — AB (ref 65–99)
Potassium: 4.3 mmol/L (ref 3.5–5.1)
Sodium: 139 mmol/L (ref 135–145)
Total Protein: 7.8 g/dL (ref 6.5–8.1)

## 2017-02-22 LAB — CBC
HEMATOCRIT: 29.4 % — AB (ref 35.0–47.0)
HEMOGLOBIN: 8.3 g/dL — AB (ref 12.0–16.0)
MCH: 20.2 pg — ABNORMAL LOW (ref 26.0–34.0)
MCHC: 28.2 g/dL — AB (ref 32.0–36.0)
MCV: 71.5 fL — ABNORMAL LOW (ref 80.0–100.0)
Platelets: 377 10*3/uL (ref 150–440)
RBC: 4.11 MIL/uL (ref 3.80–5.20)
RDW: 20.2 % — ABNORMAL HIGH (ref 11.5–14.5)
WBC: 34.1 10*3/uL — AB (ref 3.6–11.0)

## 2017-02-22 LAB — LACTIC ACID, PLASMA
Lactic Acid, Venous: 9.4 mmol/L (ref 0.5–1.9)
Lactic Acid, Venous: 4.3 mmol/L (ref 0.5–1.9)

## 2017-02-22 LAB — BRAIN NATRIURETIC PEPTIDE: B NATRIURETIC PEPTIDE 5: 845 pg/mL — AB (ref 0.0–100.0)

## 2017-02-22 LAB — PROTIME-INR
INR: 4.33 — AB
PROTHROMBIN TIME: 42.6 s — AB (ref 11.4–15.2)

## 2017-02-22 LAB — POTASSIUM: POTASSIUM: 5.8 mmol/L — AB (ref 3.5–5.1)

## 2017-02-22 LAB — TRIGLYCERIDES: Triglycerides: 68 mg/dL (ref <150)

## 2017-02-22 LAB — APTT: aPTT: 49 seconds — ABNORMAL HIGH (ref 24–36)

## 2017-02-22 LAB — PROCALCITONIN: Procalcitonin: <0.1 ng/mL

## 2017-02-22 LAB — TSH: TSH: 0.191 u[IU]/mL — AB (ref 0.350–4.500)

## 2017-02-22 LAB — MRSA PCR SCREENING: MRSA by PCR: NEGATIVE

## 2017-02-22 LAB — MAGNESIUM: Magnesium: 2.2 mg/dL (ref 1.7–2.4)

## 2017-02-22 MED ORDER — DEXTROSE 5 % IV SOLN
500.0000 mg | INTRAVENOUS | Status: DC
Start: 2017-02-22 — End: 2017-02-25
  Administered 2017-02-22 – 2017-02-24 (×3): 500 mg via INTRAVENOUS
  Filled 2017-02-22 (×4): qty 500

## 2017-02-22 MED ORDER — PIPERACILLIN-TAZOBACTAM 3.375 G IVPB 30 MIN
3.3750 g | Freq: Once | INTRAVENOUS | Status: AC
  Administered 2017-02-22: 3.375 g via INTRAVENOUS

## 2017-02-22 MED ORDER — MIDAZOLAM HCL 2 MG/2ML IJ SOLN: 2.0000 mg | INTRAMUSCULAR | Status: DC | PRN

## 2017-02-22 MED ORDER — INSULIN GLARGINE 100 UNIT/ML ~~LOC~~ SOLN
20.0000 [IU] | Freq: Every day | SUBCUTANEOUS | Status: DC
  Administered 2017-02-22: 20 [IU] via SUBCUTANEOUS
  Filled 2017-02-22 (×2): qty 0.2

## 2017-02-22 MED ORDER — VANCOMYCIN HCL 10 G IV SOLR: 2000.0000 mg | Freq: Once | INTRAVENOUS | Status: DC

## 2017-02-22 MED ORDER — SODIUM CHLORIDE 0.9 % IV SOLN
25.0000 ug/h | INTRAVENOUS | Status: DC
  Filled 2017-02-22: qty 50

## 2017-02-22 MED ORDER — STERILE WATER FOR INJECTION IV SOLN
INTRAVENOUS | Status: DC
  Administered 2017-02-22 – 2017-02-23 (×2): via INTRAVENOUS
  Filled 2017-02-22 (×4): qty 850

## 2017-02-22 MED ORDER — SODIUM CHLORIDE 0.9 % IV BOLUS (SEPSIS)
1000.0000 mL | Freq: Once | INTRAVENOUS | Status: AC
  Administered 2017-02-22: 1000 mL via INTRAVENOUS

## 2017-02-22 MED ORDER — ACETAMINOPHEN 325 MG PO TABS
650.0000 mg | ORAL_TABLET | Freq: Four times a day (QID) | ORAL | Status: DC | PRN
Start: 2017-02-22 — End: 2017-02-25
  Administered 2017-02-25: 650 mg
  Filled 2017-02-22 (×2): qty 2

## 2017-02-22 MED ORDER — FENTANYL 2500MCG IN NS 250ML (10MCG/ML) PREMIX INFUSION
0.0000 ug/h | INTRAVENOUS | Status: DC
  Administered 2017-02-22 (×2): 25 ug/h via INTRAVENOUS
  Filled 2017-02-22: qty 250

## 2017-02-22 MED ORDER — MIDAZOLAM HCL 2 MG/2ML IJ SOLN
2.0000 mg | INTRAMUSCULAR | Status: AC | PRN
  Administered 2017-02-22 – 2017-02-23 (×3): 2 mg via INTRAVENOUS
  Filled 2017-02-22 (×3): qty 2

## 2017-02-22 MED ORDER — VECURONIUM BROMIDE 10 MG IV SOLR
10.0000 mg | INTRAVENOUS | Status: AC
  Administered 2017-02-22: 10 mg via INTRAVENOUS

## 2017-02-22 MED ORDER — SODIUM CHLORIDE 0.9 % IV SOLN: INTRAVENOUS | Status: DC

## 2017-02-22 MED ORDER — ACETAMINOPHEN 650 MG RE SUPP: 650.0000 mg | Freq: Four times a day (QID) | RECTAL | Status: DC | PRN

## 2017-02-22 MED ORDER — FAMOTIDINE IN NACL 20-0.9 MG/50ML-% IV SOLN
20.0000 mg | INTRAVENOUS | Status: DC
  Administered 2017-02-22: 20 mg via INTRAVENOUS
  Filled 2017-02-22: qty 50

## 2017-02-22 MED ORDER — VANCOMYCIN HCL IN DEXTROSE 1-5 GM/200ML-% IV SOLN
1000.0000 mg | Freq: Once | INTRAVENOUS | Status: AC
  Administered 2017-02-22: 1000 mg via INTRAVENOUS
  Filled 2017-02-22: qty 200

## 2017-02-22 MED ORDER — SUCCINYLCHOLINE CHLORIDE 20 MG/ML IJ SOLN
100.0000 mg | Freq: Once | INTRAMUSCULAR | Status: AC
  Administered 2017-02-22: 100 mg via INTRAVENOUS

## 2017-02-22 MED ORDER — INSULIN ASPART 100 UNIT/ML IV SOLN
10.0000 [IU] | Freq: Once | INTRAVENOUS | Status: AC
  Administered 2017-02-22: 10 [IU] via INTRAVENOUS
  Filled 2017-02-22: qty 0.1

## 2017-02-22 MED ORDER — DEXTROSE 50 % IV SOLN: 1.0000 | Freq: Once | INTRAVENOUS | Status: DC

## 2017-02-22 MED ORDER — FUROSEMIDE 10 MG/ML IJ SOLN
INTRAMUSCULAR | Status: AC
  Administered 2017-02-22: 40 mg
  Filled 2017-02-22: qty 4

## 2017-02-22 MED ORDER — STERILE WATER FOR INJECTION IJ SOLN
INTRAMUSCULAR | Status: AC
  Administered 2017-02-22: 10:00:00
  Filled 2017-02-22: qty 10

## 2017-02-22 MED ORDER — FENTANYL CITRATE (PF) 2500 MCG/50ML IJ SOLN
0.0000 ug/h | Status: DC
  Administered 2017-02-22: 200 ug/h via INTRAVENOUS
  Administered 2017-02-23: 150 ug/h via INTRAVENOUS
  Filled 2017-02-22 (×3): qty 100

## 2017-02-22 MED ORDER — PROPOFOL 1000 MG/100ML IV EMUL
0.0000 ug/kg/min | INTRAVENOUS | Status: DC
  Administered 2017-02-22: 15 ug/kg/min via INTRAVENOUS
  Administered 2017-02-22: 5 ug/kg/min via INTRAVENOUS
  Administered 2017-02-23: 20 ug/kg/min via INTRAVENOUS
  Administered 2017-02-23: 25 ug/kg/min via INTRAVENOUS
  Administered 2017-02-24: 30 ug/kg/min via INTRAVENOUS
  Administered 2017-02-24: 25 ug/kg/min via INTRAVENOUS
  Filled 2017-02-22 (×9): qty 100

## 2017-02-22 MED ORDER — SODIUM CHLORIDE 0.9 % IV SOLN
2.0000 mg/h | INTRAVENOUS | Status: DC
  Administered 2017-02-22 (×2): 2 mg/h via INTRAVENOUS
  Filled 2017-02-22: qty 10

## 2017-02-22 MED ORDER — ONDANSETRON HCL 4 MG/2ML IJ SOLN: 4.0000 mg | Freq: Four times a day (QID) | INTRAMUSCULAR | Status: DC | PRN

## 2017-02-22 MED ORDER — ONDANSETRON HCL 4 MG PO TABS: 4.0000 mg | ORAL_TABLET | Freq: Four times a day (QID) | ORAL | Status: DC | PRN

## 2017-02-22 MED ORDER — SODIUM POLYSTYRENE SULFONATE 15 GM/60ML PO SUSP: 15.0000 g | Freq: Once | ORAL | Status: DC

## 2017-02-22 MED ORDER — PIPERACILLIN-TAZOBACTAM 4.5 G IVPB
4.5000 g | Freq: Three times a day (TID) | INTRAVENOUS | Status: DC
  Filled 2017-02-22 (×3): qty 100

## 2017-02-22 MED ORDER — ATROPINE SULFATE 1 MG/10ML IJ SOSY: 1.0000 mg | PREFILLED_SYRINGE | Freq: Once | INTRAMUSCULAR | Status: DC

## 2017-02-22 MED ORDER — DEXTROSE 5 % IV SOLN
2.0000 g | Freq: Two times a day (BID) | INTRAVENOUS | Status: DC
  Administered 2017-02-22 – 2017-02-24 (×4): 2 g via INTRAVENOUS
  Filled 2017-02-22 (×7): qty 2

## 2017-02-22 MED ORDER — INSULIN ASPART 100 UNIT/ML ~~LOC~~ SOLN
0.0000 [IU] | SUBCUTANEOUS | Status: DC
  Administered 2017-02-22: 20 [IU] via SUBCUTANEOUS
  Administered 2017-02-22: 15 [IU] via SUBCUTANEOUS
  Administered 2017-02-22: 20 [IU] via SUBCUTANEOUS
  Administered 2017-02-23: 11 [IU] via SUBCUTANEOUS
  Administered 2017-02-23: 15 [IU] via SUBCUTANEOUS
  Filled 2017-02-22 (×5): qty 1

## 2017-02-22 MED ORDER — HEPARIN SODIUM (PORCINE) 5000 UNIT/ML IJ SOLN: 5000.0000 [IU] | Freq: Three times a day (TID) | INTRAMUSCULAR | Status: DC

## 2017-02-22 MED ORDER — CHLORHEXIDINE GLUCONATE 0.12% ORAL RINSE (MEDLINE KIT)
15.0000 mL | Freq: Two times a day (BID) | OROMUCOSAL | Status: DC
  Administered 2017-02-22 – 2017-03-01 (×10): 15 mL via OROMUCOSAL
  Filled 2017-02-22 (×3): qty 15

## 2017-02-22 MED ORDER — VECURONIUM BROMIDE 10 MG IV SOLR
10.0000 mg | INTRAVENOUS | Status: DC | PRN
  Administered 2017-02-22: 10 mg via INTRAVENOUS
  Filled 2017-02-22: qty 10

## 2017-02-22 MED ORDER — ORAL CARE MOUTH RINSE
15.0000 mL | Freq: Four times a day (QID) | OROMUCOSAL | Status: DC
  Administered 2017-02-22 – 2017-02-24 (×6): 15 mL via OROMUCOSAL

## 2017-02-22 MED ORDER — VANCOMYCIN HCL 10 G IV SOLR
1250.0000 mg | INTRAVENOUS | Status: DC
  Administered 2017-02-22 – 2017-02-23 (×2): 1250 mg via INTRAVENOUS
  Filled 2017-02-22 (×3): qty 1250

## 2017-02-22 MED ORDER — NOREPINEPHRINE BITARTRATE 1 MG/ML IV SOLN
0.0000 ug/min | INTRAVENOUS | Status: DC
  Administered 2017-02-22: 6 ug/min via INTRAVENOUS
  Filled 2017-02-22: qty 16

## 2017-02-22 MED ORDER — ACETAMINOPHEN 325 MG PO TABS
650.0000 mg | ORAL_TABLET | Freq: Four times a day (QID) | ORAL | Status: DC | PRN
Start: 2017-02-22 — End: 2017-02-22

## 2017-02-22 MED ORDER — ETOMIDATE 2 MG/ML IV SOLN
20.0000 mg | Freq: Once | INTRAVENOUS | Status: AC
  Administered 2017-02-22: 20 mg via INTRAVENOUS

## 2017-02-22 MED ORDER — INSULIN ASPART 100 UNIT/ML ~~LOC~~ SOLN: 0.0000 [IU] | Freq: Three times a day (TID) | SUBCUTANEOUS | Status: DC

## 2017-02-22 MED ORDER — FENTANYL CITRATE (PF) 100 MCG/2ML IJ SOLN
25.0000 ug | Freq: Once | INTRAMUSCULAR | Status: AC
  Administered 2017-02-22: 25 ug via INTRAVENOUS

## 2017-02-22 MED ORDER — NOREPINEPHRINE BITARTRATE 1 MG/ML IV SOLN
0.0000 ug/min | INTRAVENOUS | Status: DC
  Filled 2017-02-22: qty 4

## 2017-02-22 MED ORDER — INSULIN REGULAR HUMAN 100 UNIT/ML IJ SOLN
10.0000 [IU] | Freq: Once | INTRAMUSCULAR | Status: AC
  Administered 2017-02-22: 10 [IU] via INTRAVENOUS
  Filled 2017-02-22: qty 0.1

## 2017-02-22 MED ORDER — VECURONIUM BROMIDE 10 MG IV SOLR
INTRAVENOUS | Status: AC
  Administered 2017-02-22: 10 mg via INTRAVENOUS
  Filled 2017-02-22: qty 10

## 2017-02-22 MED ORDER — INSULIN ASPART 100 UNIT/ML ~~LOC~~ SOLN: 0.0000 [IU] | SUBCUTANEOUS | Status: DC

## 2017-02-22 MED FILL — Medication: Qty: 1 | Status: AC

## 2017-02-22 NOTE — Progress Notes (Signed)
[facility] received CB. Eudora reported to ICU1 and provided a presence in room. Got family # to call but line was busy.   02/22/17 0950  Clinical Encounter Type  Visited With Patient  Visit Type Code  Referral From Nurse  Consult/Referral To Chaplain  Spiritual Encounters  Spiritual Needs Prayer;Emotional

## 2017-02-22 NOTE — Therapy (Signed)
Patient received from EMS with LMA in place. Patient unresponsive with pulse. Patient orally intubated by Dr.Norman with #8.0 ETT on first attempt. Bilateral breath sounds appreciated, positive response from CO2 detector. ETT secured at 24 cm with commercial tube holder. Placed on vent at documented settings. VBG done prior to vent, shows critical values. Results RBAV by Dr. Mariea Clonts.

## 2017-02-22 NOTE — ED Provider Notes (Signed)
[facility] Emergency Department Provider Note  ____________________________________________  Time seen: Approximately 8:07 AM  I have reviewed the triage vital signs and the nursing notes.   HISTORY  Chief Complaint Respiratory Arrest  Level V caveat due to unresponsiveness.  HPI Andrea Rivers is a 61 y.o. female with a history of COPD and recurrent respiratory failure, A. fib,her bit obesity, DM, presenting for respiratory failure. EMS was called for shortness of breath and on arrival the patient was in respiratory distress. She did seem to improve with CPAP that when she was transported into the ambulance, she lost consciousness and did not have a pulse. CPR was initiated and the patient received 4 rounds of epinephrine, and did regain spontaneous circulation. Her airway was secured with an LMA, and she continued to require significant respiratory assistance in route even once her pulses came back. On arrival to the emergency department, the patient had an oxygen saturation of 83% while being bagged, and did have palpable pulses. She had not been given any sedative or paralytic, and was completely unresponsive. She was agonally breathing 1-2 times per minute.   Past Medical History:  Diagnosis Date  . Anemia   . Anxiety   . Chronic kidney disease    INSUFFICIENCY  . COPD (chronic obstructive pulmonary disease) ([place])   . Diabetes mellitus without complication ([place])   . Dyspnea    DOE  . Dysrhythmia    A FIB  . Edema   . GERD (gastroesophageal reflux disease)   . History of kidney stones   . Hypertension   . Hypothyroidism    ABLATION  . Neuropathy   . Orthopnea   . Sleep apnea    CPAP  . Vertigo   . Wheezing     Patient Active Problem List   Diagnosis Date Noted  . Acute respiratory failure ([place]) 01/26/2017    Past Surgical History:  Procedure Laterality Date  . CATARACT EXTRACTION W/PHACO Left 01/26/2017   Procedure: CATARACT  EXTRACTION PHACO AND INTRAOCULAR LENS PLACEMENT (IOC);  Surgeon: Estill Cotta, MD;  Location: [facility] ORS;  Service: Ophthalmology;  Laterality: Left;  [place]   1:02.2 AP     23.7 CDE   28.92 fluid pack lot# [id number] H exp.05/08/2018  . CHOLECYSTECTOMY    . TONSILLECTOMY      Current Outpatient Rx  . Order #: [id number] Class: Historical Med  . Order #: [id number] Class: Normal  . Order #: [id number] Class: Historical Med  . Order #: [id number] Class: Historical Med  . Order #: [id number] Class: Normal  . Order #: [id number] Class: Historical Med  . Order #: [id number] Class: Historical Med  . Order #: [id number] Class: Historical Med  . Order #: [id number] Class: Historical Med  . Order #: [id number] Class: Historical Med  . Order #: [id number] Class: Historical Med  . Order #: [id number] Class: Historical Med  . Order #: [id number] Class: Historical Med  . Order #: [id number] Class: Historical Med  . Order #: [id number] Class: Historical Med  . Order #: [id number] Class: Historical Med  . Order #: [id number] Class: Normal  . Order #: [id number] Class: Normal  . Order #: [id number] Class: Normal  . Order #: [id number] Class: Historical Med  . Order #: [id number] Class: Historical Med  . Order #: [id number] Class: Historical Med  . Order #: [id number] Class: Historical Med  . Order #: [id number] Class: Historical Med    Allergies Other  No family history on file.  Social History Social History  Substance Use Topics  . Smoking status: Former Research scientist (life sciences)  . Smokeless tobacco: Former Systems developer  . Alcohol  use No    Review of Systems Unable to obtain due to unresponsiveness.   ____________________________________________   PHYSICAL EXAM:  VITAL SIGNS: ED Triage Vitals  Enc Vitals Group     BP      Pulse      Resp      Temp      Temp src      SpO2      Weight      Height      Head Circumference      Peak Flow      Pain Score      Pain Loc      Pain Edu?      Excl. in [place]?     Constitutional: Unresponsive,  intubated, no evidence of gag. GCS 3. Eyes: Conjunctivae are normal.  No scleral icterus.  Pupils are 3 mm symmetrically bilaterally. Head: Atraumatic. Nose: No congestion/rhinnorhea. Mouth/Throat: Mucous membranes are dry. Mild blood in the posterior pharynx after removal of the LMA.  Neck: No stridor.  Supple.  No JVD. Cardiovascular: Normal rate, regular rhythm. No murmurs, rubs or gallops.  Respiratory: Decreased respiratory drive with respiratory rate of 1-2 times per minute. Diffuse rales in the lung fields. No wheezing. Gastrointestinal: Morbidly obese. Soft, nontender and distended.  No guarding or rebound.  No peritoneal signs. Musculoskeletal: No LE edema. . Neurologic:  GCS is 3. Does not make any purposeful movements. Does not have a gag reflex. Skin:  Skin is warm, dry and intact. No rash noted. Psychiatric: Unable to assess due to mental status. ____________________________________________   LABS (all labs ordered are listed, but only abnormal results are displayed)  Labs Reviewed  CULTURE, BLOOD (ROUTINE X 2)  CULTURE, BLOOD (ROUTINE X 2)  URINE CULTURE  COMPREHENSIVE METABOLIC PANEL  CBC WITH DIFFERENTIAL/PLATELET  URINALYSIS, ROUTINE W REFLEX MICROSCOPIC  LACTIC ACID, PLASMA  LACTIC ACID, PLASMA  BRAIN NATRIURETIC PEPTIDE  TROPONIN I  BLOOD GAS, VENOUS  APTT  PROTIME-INR   ____________________________________________  EKG  ED ECG REPORT I, Eula Listen, the attending physician, personally viewed and interpreted this ECG.   Date: 02/22/2017  EKG Time: 801  Rate: 76  Rhythm: normal sinus rhythm + PVC  Axis: normal  Intervals:none  ST&T Change: No STEMI  ____________________________________________  RADIOLOGY  Dg Chest Portable 1 View  Result Date: 02/22/2017 CLINICAL DATA:  Endotracheal tube placement EXAM: PORTABLE CHEST 1 VIEW COMPARISON:  01/27/2017 FINDINGS: Endotracheal tube in good position.  NG tip not well seen. Interval worsening  of diffuse bilateral airspace disease most prominent right upper lobe. Possible edema versus pneumonia. No pleural effusion. Cardiac enlargement IMPRESSION: Endotracheal tube in good position. Significant worsening in bilateral airspace disease which may represent pneumonia or edema. Electronically Signed   By: Franchot Gallo M.D.   On: 02/22/2017 08:16    ____________________________________________   PROCEDURES  Procedure(s) performed: None  Procedures  Critical Care performed: Yes, see critical care note(s) ____________________________________________   INITIAL IMPRESSION / ASSESSMENT AND PLAN / ED COURSE  Pertinent labs & imaging results that were available during my care of the patient were reviewed by me and considered in my medical decision making (see chart for details).  61 y.o. female with a history of COPD and morbid obesity, recurrent hypercarbic respiratory failure, presenting with respiratory arrest. On arrival to the emergency department, the patient did have palpable pulses, but she was unresponsive with a GCS of 3. Her LMA was removed and she was intubated without any difficulty. Her O2  saturations went from low 80s to 100% on the ventilator. Her chest x-ray looked like diffuse hazy opacities in the bilateral lung fields on my examination, and she was covered immediately with empiric antibiotics. In addition, she is receiving some fluids, as well as sedation. The hospitalist was called for admission.  CRITICAL CARE Performed by: Eula Listen   Total critical care time: 45 minutes  Critical care time was exclusive of separately billable procedures and treating other patients.  Critical care was necessary to treat or prevent imminent or life-threatening deterioration.  Critical care was time spent personally by me on the following activities: development of treatment plan with patient and/or surrogate as well as nursing, discussions with consultants,  evaluation of patient's response to treatment, examination of patient, obtaining history from patient or surrogate, ordering and performing treatments and interventions, ordering and review of laboratory studies, ordering and review of radiographic studies, pulse oximetry and re-evaluation of patient's condition.  INTUBATION Performed by: Eula Listen  Required items: required blood products, implants, devices, and special equipment available Patient identity confirmed: provided demographic data and hospital-assigned identification number Time out: Immediately prior to procedure a "time out" was called to verify the correct patient, procedure, equipment, support staff and site/side marked as required.  Indications: respiratory arrest   Intubation method: Direct Laryngoscopy   Preoxygenation: BVM up to mid90's  Sedatives: 20mg  Etomidate Paralytic: 100mg   Succinylcholine  Tube Size: 8.0 cuffed  Post-procedure assessment: chest rise and ETCO2 monitor Breath sounds: equal and absent over the epigastrium Tube secured with: ETT holder Chest x-ray interpreted by radiologist and me.  Chest x-ray findings: Personally confirmed endotracheal tube in appropriate position  Patient tolerated the procedure well with no immediate complications.     ____________________________________________  FINAL CLINICAL IMPRESSION(S) / ED DIAGNOSES  Final diagnoses:  Respiratory arrest ([place])  Cardiac arrest ([place])  Hypoxia         NEW MEDICATIONS STARTED DURING THIS VISIT:  New Prescriptions   No medications on file      Eula Listen, MD 02/22/17 [phone]

## 2017-02-22 NOTE — Progress Notes (Signed)
Anticoagulation Monitoring:   61 yo female ICU patient admitted to ICU for respiratory failure requiring mechanical ventilation. Patient ordered heparin 5000 units SQ Q8hr. Patient's home medication regimen includes warfarin and current INR is 4.33. Per protocol, will hold heparin at this time and order INR with am labs.   Pharmacy will continue to monitor and adjust per protocol.Marland Kitchen    MLS

## 2017-02-22 NOTE — Care Management (Signed)
Met with patient's family to introduce myself and my role in case mgt.  Husband remembered me from previous encounter. He has been made aware that RNCM can assist with any questions he may have regarding patient. RNCM contact card shared with husband. He appreciated information. RNCM will continue to follow.

## 2017-02-22 NOTE — Consult Note (Addendum)
PULMONARY / CRITICAL CARE MEDICINE   Name: Andrea Rivers MRN: [id number] DOB: 02/10/1956    ADMISSION DATE:  02/22/2017  PT PROFILE: 61 y.o. F with multiple medical problems including severe obesity awoke in the middle of the night with dyspnea. Her husband called EMS. She suffered cardiopulmonary arrest en route to the emergency department. She underwent resuscitation and ultimately intubation in the emergency department. Upon arrival to the ICU, she again suffered PEA arrest. She is on chronic warfarin for unknown reasons with admission INR of 4.33.   MAJOR EVENTS/TEST RESULTS: 07/17 admitted via ED with cardiopulmonary arrest 2, severe hypoxemia, persistent shock. 07/17 echocardiogram: 07/17 LE venous US:  INDWELLING DEVICES:: ETT 07/17 >>  R IJ CVL 07/17 >>   MICRO DATA: MRSA PCR 07/17 >> NEG Urine 07/17 >>  Resp 07/17 >>  Blood 07/17 >>   ANTIMICROBIALS:  Vancomycin 07/17 >>  Cefepime 07/17 >>     HISTORY OF PRESENT ILLNESS:   As above. She was in her usual state of poor health on the day prior to admission. Her husband is unable to provide any further history to elucidate the circumstances of her admission.  PAST MEDICAL HISTORY :  She  has a past medical history of Anemia; Anxiety; Chronic kidney disease; COPD (chronic obstructive pulmonary disease) ([place]); Diabetes mellitus without complication ([place]); Dyspnea; Dysrhythmia; Edema; GERD (gastroesophageal reflux disease); History of kidney stones; Hypertension; Hypothyroidism; Neuropathy; Orthopnea; Sleep apnea; Vertigo; and Wheezing.  PAST SURGICAL HISTORY: She  has a past surgical history that includes Tonsillectomy; Cholecystectomy; and Cataract extraction w/PHACO (Left, 01/26/2017).  Allergies  Allergen Reactions  . Other Anaphylaxis    ILOSONE,  Caused GI distress.    No current facility-administered medications on file prior to encounter.    Current Outpatient Prescriptions on File Prior to Encounter   Medication Sig  . amLODipine-benazepril (LOTREL) 5-20 MG capsule Take 1 capsule by mouth daily.  Marland Kitchen amoxicillin-clavulanate (AUGMENTIN) 875-125 MG tablet Take 1 tablet by mouth 2 (two) times daily.  . celecoxib (CELEBREX) 200 MG capsule Take 200 mg by mouth daily.  . citalopram (CELEXA) 20 MG tablet Take 20 mg by mouth daily.  Marland Kitchen glimepiride (AMARYL) 2 MG tablet Take 2 mg by mouth daily with breakfast.  . insulin glargine (LANTUS) 100 UNIT/ML injection Inject 12 Units into the skin at bedtime.  . insulin lispro (HUMALOG) 100 UNIT/ML injection Inject 40 Units into the skin 2 (two) times daily as needed for high blood sugar.  . levothyroxine (SYNTHROID, LEVOTHROID) 175 MCG tablet Take 175 mcg by mouth daily before breakfast.  . linagliptin (TRADJENTA) 5 MG TABS tablet Take 5 mg by mouth daily.  Marland Kitchen lovastatin (MEVACOR) 20 MG tablet Take 20 mg by mouth at bedtime.  . meclizine (ANTIVERT) 12.5 MG tablet Take 12.5 mg by mouth.  . metFORMIN (GLUCOPHAGE) 1000 MG tablet Take 1,000 mg by mouth.   . metoprolol succinate (TOPROL-XL) 100 MG 24 hr tablet Take 100 mg by mouth every morning.  . moxifloxacin (VIGAMOX) 0.5 % ophthalmic solution Place 1 drop into the left eye 4 (four) times daily.  . nepafenac (ILEVRO) 0.3 % ophthalmic suspension Place 1 drop into the left eye daily.  Marland Kitchen nystatin (MYCOSTATIN) 100000 UNIT/ML suspension Take 5 mLs (500,000 Units total) by mouth 4 (four) times daily.  . pantoprazole (PROTONIX) 40 MG tablet Take 40 mg by mouth 2 (two) times daily.  Marland Kitchen warfarin (COUMADIN) 1 MG tablet Take 0.5 mg by mouth daily at 6 PM.  . warfarin (  COUMADIN) 7.5 MG tablet Take 7.5 mg by mouth daily at 6 PM.  . Difluprednate 0.05 % EMUL Apply 1 drop to eye Nightly.  . loratadine (CLARITIN) 10 MG tablet Take 10 mg by mouth daily.  . magnesium oxide (MAG-OX) 400 MG tablet Take 400 mg by mouth 2 (two) times daily.    FAMILY HISTORY:  Her has no family status information on file.    SOCIAL  HISTORY: She  reports that she has quit smoking. She has quit using smokeless tobacco. She reports that she does not drink alcohol.  REVIEW OF SYSTEMS:   Level V caveat  SUBJECTIVE:    VITAL SIGNS: BP (!) 147/95   Pulse 78   Temp 98.5 F (36.9 C)   Resp 17   Ht 5\' 6"  (1.676 m)   Wt (!) 322 lb (146.1 kg)   SpO2 (!) 89%   BMI 51.97 kg/m   HEMODYNAMICS:    VENTILATOR SETTINGS: Vent Mode: PRVC FiO2 (%):  [100 %] 100 % Set Rate:  [16 bmp-30 bmp] 30 bmp Vt Set:  [550 mL] 550 mL PEEP:  [5 cmH20-10 cmH20] 10 cmH20  INTAKE / OUTPUT: No intake/output data recorded.  PHYSICAL EXAMINATION: General: RASS -4, not F/C, extremely obese Neuro: PERRL, EOMI, no spontaneous movement, no withdrawal from pain HEENT: NCAT, sclerae white Cardiovascular: Bradycardia, regular, no murmurs Lungs: No adventitious sounds noted anteriorly Abdomen: Extremely obese, diminished to absent bowel sounds, soft Extremities: Cool, no edema, diminished pulses  LABS:  BMET  Recent Labs Lab 02/22/17 0755 02/22/17 1040  NA 139 138  K 4.3 5.1  CL 103 106  CO2 20* 20*  BUN 37* 43*  CREATININE 2.11* 2.16*  GLUCOSE 356* 415*    Electrolytes  Recent Labs Lab 02/22/17 0755 02/22/17 1040  CALCIUM 8.6* 7.9*    CBC  Recent Labs Lab 02/22/17 0755 02/22/17 1040  WBC 16.1* 34.1*  HGB 8.6* 8.3*  HCT 30.2* 29.4*  PLT 373 377    Coag's  Recent Labs Lab 02/22/17 0755  APTT 49*  INR 4.33*    Sepsis Markers  Recent Labs Lab 02/22/17 0755 02/22/17 0800 02/22/17 1038  LATICACIDVEN  --  9.4* 4.3*  PROCALCITON <0.10  --   --     ABG  Recent Labs Lab 02/22/17 1130  PHART 7.26*  PCO2ART 40  PO2ART 63*    Liver Enzymes  Recent Labs Lab 02/22/17 0755  AST 38  ALT 17  ALKPHOS 87  BILITOT 0.5  ALBUMIN 3.5    Cardiac Enzymes  Recent Labs Lab 02/22/17 0755 02/22/17 1040  TROPONINI <0.03 0.04*    Glucose  Recent Labs Lab 02/22/17 1008  GLUCAP 330*     CXR: Cardiomegaly, patchy bilateral airspace disease   ASSESSMENT / PLAN:  PULMONARY A: Acute respiratory failure with hypoxemia Ventilator dependence Possible pneumonia, NOS ARDS versus pulmonary edema P:   Vent settings established Vent bundle implemented Daily SBT as indicated   CARDIOVASCULAR A:  Cardiac arrest 2 07/17 (PEA) Shock, presumed septic in origin Recent episode of atrial fibrillation Relative bradycardia P:  NE to maintain MAP goal > 65 mmHg Echocardiogram ordered Cycle cardiac markers  RENAL A:   AKI, oliguria Mild metabolic acidosis Borderline hyperkalemia P:   Monitor BMET intermittently Monitor I/Os Correct electrolytes as indicated HCO3 infusion  GASTROINTESTINAL A:   Severe obesity P:   SUP: IV famotidine Consider TFs 07/18  HEMATOLOGIC A:   Anemia without evidence of acute blood loss Chronic warfarin therapy with  prolonged INR  Therefore, doubt VTE P:  DVT px: SCDs Monitor CBC intermittently Transfuse per usual guidelines Holding warfarin Monitor prothrombin time  INFECTIOUS A:   Suspected severe sepsis Suspected HCAP P:   Monitor temp, WBC count Micro and abx as above  ENDOCRINE A:   DM 2 with severe hyperglycemia P:   Lantus + SSI ordered Might require insulin infusion  NEUROLOGIC A:   Post-anoxic encephalopathy ICU/vent associated discomfort P:   RASS goal: -1, -2 Fentanyl infusion as needed to maintain RASS goal Daily WUA   FAMILY: Husband updated in detail at bedside   CCM time:  60 mins The above time includes time spent in consultation with patient and/or family members and reviewing care plan on multidisciplinary rounds  Merton Border, MD PCCM service Mobile [phone] Pager [phone]   02/22/2017, 1:31 PM

## 2017-02-22 NOTE — Therapy (Signed)
Patient dyssynchronous with vent, abdominal respirations. Vent rate increased with little effect. PEEP increased to+10, unable to maintain SpO2 greater than 90%. Patient appears agitated despite sedation. Dr.Norman aware of same

## 2017-02-22 NOTE — Progress Notes (Signed)
[facility] Progress Note Patient Name: Andrea Rivers DOB: 1956/01/04 MRN: [id number]   Date of Service  02/22/2017  HPI/Events of Note  HR in 40's, patient critically ill  eICU Interventions  Atropine and labs ordered     Intervention Category Evaluation Type: Other  [name] 02/22/2017, 4:43 PM

## 2017-02-22 NOTE — Progress Notes (Signed)
[facility] Progress Note Patient Name: Andrea Rivers DOB: 1956-02-01 MRN: [id number]   Date of Service  02/22/2017  HPI/Events of Note  Patient with increased WOB on vent  eICU Interventions  propofol and versed ordered vec as needed     Intervention Category Evaluation Type: Other  [name] 02/22/2017, 3:33 PM

## 2017-02-22 NOTE — Progress Notes (Signed)
Inpatient Diabetes Program Recommendations  AACE/ADA: New Consensus Statement on Inpatient Glycemic Control (2015)  Target Ranges:  Prepandial:   less than 140 mg/dL      Peak postprandial:   less than 180 mg/dL (1-2 hours)      Critically ill patients:  140 - 180 mg/dL   Lab Results  Component Value Date   GLUCAP 407 (H) 02/22/2017    Review of Glycemic Control  Results for Andrea Rivers, Andrea Rivers (MRN [id number]) as of 02/22/2017 16:54  Ref. Range 02/22/2017 10:08 02/22/2017 13:40 02/22/2017 16:24  Glucose-Capillary Latest Ref Range: 65 - 99 mg/dL 330 (H) 414 (H) 407 (H)    Diabetes history: Type 2 Outpatient Diabetes medications:Lantus 12 units qhs, Humalog 40 units bid, Tradjenta 5mg  qday, Amaryl 2mg  qday  Current orders for Inpatient glycemic control: Novolog 0-20 units q4h, Lantus 20 units qday  Inpatient Diabetes Program Recommendations:  Consider placing the patient on the Adult ICU Glycemic Control order set since CBG consistently greater than 300mg /dl.   Gentry Fitz, RN, BA, MHA, CDE Diabetes Coordinator Inpatient Diabetes Program  [phone] (Team Pager) [phone] ([place]) 02/22/2017 5:07 PM

## 2017-02-22 NOTE — Progress Notes (Signed)
Initial Nutrition Assessment  DOCUMENTATION CODES:   Morbid obesity  INTERVENTION:  When able to initiate TF per MD, recommend initiating Vital High Protein at 50 ml/hr + Pro-Stat 30 ml TID. Goal regimen provides 1500 kcal, 150 grams of protein, 1008 ml H2O daily.  Recommend liquid multivitamin with minerals daily per tube as goal TF regimen does not meet 100% RDIs.  Recommend confirming placement of OGT prior to initiating TF as per abdominal x-ray from today OGT not appreciable.  NUTRITION DIAGNOSIS:   Inadequate oral intake related to inability to eat as evidenced by NPO status.  GOAL:   Provide needs based on ASPEN/SCCM guidelines  MONITOR:   Vent status, Labs, Weight trends, TF tolerance, I & O's  REASON FOR ASSESSMENT:   Ventilator    ASSESSMENT:   61 year old female with PMHx of sleep apnea on CPAP at home, HTN, A-fib, GERD, DM type 2, CKD, hypothyroidism, anxiety, COPD, who had dyspnea overnight. Suffered cardiopulmonary arrest en route to hospital, underwent resuscitation and intubation in ED on 7/17. Upon arrival to ICU had another PEA arrest.   -Noted in chart plan is to consider initiation of TFs on 7/18.   Spoke with patient's family at bedside. They report patient was eating well PTA. Report patient has been weight stable. Noted in chart patient was 299.4 lbs on 01/26/2017 (currently 322 lbs), so will monitor weight trend during admission.   Access: OGT placed this morning at 0830; per abdominal x-ray today no appreciable OGT  MAP: >/= 60  Patient is currently intubated on ventilator support MV: 15.9 L/min Temp (24hrs), Avg:98.3 F (36.8 C), Min:97.6 F (36.4 C), Max:98.5 F (36.9 C)  Propofol: N/A  Medications reviewed and include: Novolog 0-20 units Q4hrs, Lantus 20 units daily, NS @ 50 ml/hr (not given), azithromycin, cefepime, famotidine, fentanyl gtt, norepinephrine gtt, vancomycin.   Labs reviewed: CBG 330-414, CO2 20, BUN 43, Creatinine 2.16,  elevated Troponin, Lactic Acid 4.3.   Nutrition-Focused physical exam completed. Findings are no fat depletion, no muscle depletion, and mild edema.   Discussed with RN.  Diet Order:     Skin:  Reviewed, no issues (RN skin assessment not completed at this time)  Last BM:  Unknown  Height:   Ht Readings from Last 1 Encounters:  02/22/17 5\' 6"  (1.676 m)    Weight:   Wt Readings from Last 1 Encounters:  02/22/17 (!) 322 lb (146.1 kg)    Ideal Body Weight:  59.1 kg  BMI:  Body mass index is 51.97 kg/m.  Estimated Nutritional Needs:   Kcal:  1610-9604 (22-25 kcal/kg IBW)  Protein:  >/= 148 grams (>/= 2.5 grams/kg IBW)  Fluid:  1.8 L/day (30 ml/kg IBW)  EDUCATION NEEDS:   No education needs identified at this time  Willey Blade, MS, RD, LDN Pager: [phone] After Hours Pager: [phone]

## 2017-02-22 NOTE — Progress Notes (Signed)
[facility] responded to report that a PT in ED2 had been brought in who had stopped breathing. Upon arriving [facility] meet with family and prayed    02/22/17 0845  Clinical Encounter Type  Visited With Patient and family together  Visit Type Follow-up  Referral From Nurse  Consult/Referral To Chaplain  Spiritual Encounters  Spiritual Needs Prayer;Emotional;Grief support

## 2017-02-22 NOTE — Progress Notes (Addendum)
Pharmacy Antibiotic Note  Andrea Rivers is a 61 y.o. female admitted on 02/22/2017 with acute respiratory failure. Patient admitted via EMS and required CPR in route to hospital. Pharmacy has been consulted for vancomycin and Zosyn dosing. Patient received vancomycin 1g and Zosyn 3.375g IV x 1 in ED. MRSA PCR and procalcitonin levels are pending.   Plan: Zosyn changed to cefepime. Will initiate cefepime dosing at 2 g iv q 12 hours starting 6 hours after initial Zosyn dose.   Vancomycin 1500mg  IV Q18hr for goal trough of 15-20. Will order next dose to start at 1400 per stack dosing protocol. Will follow renal function and obtain trough as indicated.   Height: 5\' 6"  (167.6 cm) Weight: (!) 322 lb (146.1 kg) IBW/kg (Calculated) : 59.3  No data recorded.   Recent Labs Lab 02/22/17 0755 02/22/17 0800  CREATININE 2.11*  --   LATICACIDVEN  --  9.4*    Estimated Creatinine Clearance: 41.5 mL/min (A) (by C-G formula based on SCr of 2.11 mg/dL (H)).    Allergies  Allergen Reactions  . Other Anaphylaxis    ILOSONE,  Caused GI distress.    Antimicrobials this admission: Zosyn 7/17 x 1 Vancomycin 7/17 >>  Cefepime 7/17 >>  Dose adjustments this admission: N/A  Microbiology results: 7/17 BCx: pending  7/17 UCx: pending  7/17 Sputum: pending  7/17 MRSA PCR: pending   Thank you for allowing pharmacy to be a part of this patient's care.  Ulice Dash, PharmD Clinical Pharmacist  02/22/2017 8:42 AM

## 2017-02-22 NOTE — ED Provider Notes (Signed)
[facility]  Department of Emergency Medicine   Code Blue CONSULT NOTE  Chief Complaint: Cardiac arrest/unresponsive   Level V Caveat: Unresponsive  History of present illness: I was contacted by the hospital for a CODE BLUE cardiac arrest upstairs and presented to the patient's bedside.   61 y.o. female with presented to the emergency department in cardiopulmonary arrest, with return of spontaneous circulation, who was found to be pulseless after transfer to the ICU bed. On arrival to the ICU, CPR was in progress.  ROS: Unable to obtain, Level V caveat  Scheduled Meds: . furosemide      . heparin  5,000 Units Subcutaneous Q8H  . insulin aspart  0-9 Units Subcutaneous TID WC  . sterile water (preservative free)      . vecuronium      . vecuronium  10 mg Intravenous STAT   Continuous Infusions: . sodium chloride    . fentaNYL 50 mcg/hr (02/22/17 0918)  . midazolam (VERSED) infusion Stopped (02/22/17 0955)  . piperacillin-tazobactam (ZOSYN)  IV    . sodium chloride    . vancomycin    . vancomycin 1,000 mg (02/22/17 0910)   PRN Meds:.acetaminophen **OR** acetaminophen, ondansetron **OR** ondansetron (ZOFRAN) IV Past Medical History:  Diagnosis Date  . Anemia   . Anxiety   . Chronic kidney disease    INSUFFICIENCY  . COPD (chronic obstructive pulmonary disease) ([place])   . Diabetes mellitus without complication ([place])   . Dyspnea    DOE  . Dysrhythmia    A FIB  . Edema   . GERD (gastroesophageal reflux disease)   . History of kidney stones   . Hypertension   . Hypothyroidism    ABLATION  . Neuropathy   . Orthopnea   . Sleep apnea    CPAP  . Vertigo   . Wheezing    Past Surgical History:  Procedure Laterality Date  . CATARACT EXTRACTION W/PHACO Left 01/26/2017   Procedure: CATARACT EXTRACTION PHACO AND INTRAOCULAR LENS PLACEMENT (IOC);  Surgeon: Estill Cotta, MD;  Location: [facility] ORS;  Service: Ophthalmology;  Laterality: Left;  [place]   1:02.2 AP      23.7 CDE   28.92 fluid pack lot# [id number] H exp.05/08/2018  . CHOLECYSTECTOMY    . TONSILLECTOMY     Social History   Social History  . Marital status: Legally Separated    Spouse name: N/A  . Number of children: N/A  . Years of education: N/A   Occupational History  . Not on file.   Social History Main Topics  . Smoking status: Former Research scientist (life sciences)  . Smokeless tobacco: Former Systems developer  . Alcohol use No  . Drug use: Unknown  . Sexual activity: Not on file   Other Topics Concern  . Not on file   Social History Narrative  . No narrative on file   Allergies  Allergen Reactions  . Other Anaphylaxis    ILOSONE,  Caused GI distress.    Last set of Vital Signs (not current) Vitals:   02/22/17 0915 02/22/17 0922  BP: 131/86 (!) 147/95  Pulse: 80 78  Resp: (!) 27 17  Temp: 98.5 F (36.9 C) 98.5 F (36.9 C)      Physical Exam  Gen: unresponsiveAnd intubated Cardiovascular: pulseless  Resp: apneic. Breath sounds equal bilaterally with bagging but significant rales bilaterally Abd: Distended Neuro: GCS 3, unresponsive to pain  Musculoskeletal: No deformity  Skin: warm  Procedures  INTUBATION Performed by: Eula Listen in the emergency  department.  The patient was already intubated, but the tube was checked, and she did have breath sounds bilaterally. Her oxygen saturations were mildly low, likely due to edema versus worsening infection  CRITICAL CARE Performed by: Eula Listen Total critical care time: 30 Critical care time was exclusive of separately billable procedures and treating other patients. Critical care was necessary to treat or prevent imminent or life-threatening deterioration. Critical care was time spent personally by me on the following activities: development of treatment plan with patient and/or surrogate as well as nursing, discussions with consultants, evaluation of patient's response to treatment, examination of patient, obtaining  history from patient or surrogate, ordering and performing treatments and interventions, ordering and review of laboratory studies, ordering and review of radiographic studies, pulse oximetry and re-evaluation of patient's condition.  Cardiopulmonary Resuscitation (CPR) Procedure Note  Directed/Performed by: The ICU physician  Medical Decision making  61 y.o. female who had presented with hypercarbic cardiopulmonary arrest, who then lost pulses in the intensive care unit. This was likely due to worsening of her ongoing medical illness.   Assessment and Plan    The patient did have an airway in place in her intensive care unit team was managing the remainder of her code.     Eula Listen, MD 02/22/17 1010

## 2017-02-22 NOTE — H&P (Signed)
[place] [facility] [place] NAME: Andrea Rivers    MR#:  [id number]  DATE OF BIRTH:  11-Jul-1956  DATE OF ADMISSION:  02/22/2017  PRIMARY CARE PHYSICIAN: Dayton Martes Victoriano Lain, NP   REQUESTING/REFERRING PHYSICIAN: Marsa Aris MD  CHIEF COMPLAINT:   Chief Complaint  Patient presents with  . Respiratory Arrest    HISTORY OF PRESENT ILLNESS: Andrea Rivers  is a 61 y.o. female with a known history of COPD, sleep apnea, diabetes type 2, morbid obesity, hypothyroidism and hypertension presents with acute respiratory failure. EMS was called for shortness of breath. When EMS got there patient was in respiratory distress. She was initially placed on CPAP. And then on the way to the hospital ambulance sheet did not have a pulse CPR was initiated and she did receive 4 doses of epinephrine. She did regain spontaneous circulation. Her airway was secured with LMA. She subsequently was intubated. Currently on the ventilator unable to provide any review of systems.   PAST MEDICAL HISTORY:   Past Medical History:  Diagnosis Date  . Anemia   . Anxiety   . Chronic kidney disease    INSUFFICIENCY  . COPD (chronic obstructive pulmonary disease) ([place])   . Diabetes mellitus without complication ([place])   . Dyspnea    DOE  . Dysrhythmia    A FIB  . Edema   . GERD (gastroesophageal reflux disease)   . History of kidney stones   . Hypertension   . Hypothyroidism    ABLATION  . Neuropathy   . Orthopnea   . Sleep apnea    CPAP  . Vertigo   . Wheezing     PAST SURGICAL HISTORY:  Past Surgical History:  Procedure Laterality Date  . CATARACT EXTRACTION W/PHACO Left 01/26/2017   Procedure: CATARACT EXTRACTION PHACO AND INTRAOCULAR LENS PLACEMENT (IOC);  Surgeon: Estill Cotta, MD;  Location: [facility] ORS;  Service: Ophthalmology;  Laterality: Left;  [place]   1:02.2 AP     23.7 CDE   28.92 fluid pack lot# [id number] H exp.05/08/2018  . CHOLECYSTECTOMY    .  TONSILLECTOMY      SOCIAL HISTORY:  Social History  Substance Use Topics  . Smoking status: Former Research scientist (life sciences)  . Smokeless tobacco: Former Systems developer  . Alcohol use No    FAMILY HISTORY: Intubated unable to provide    DRUG ALLERGIES:  Allergies  Allergen Reactions  . Other Anaphylaxis    ILOSONE,  Caused GI distress.    REVIEW OF SYSTEMS:   CONSTITUTIONAL:  Intubated and unable to provide    MEDICATIONS AT HOME:  Prior to Admission medications   Medication Sig Start Date End Date Taking? Authorizing Provider  amLODipine-benazepril (LOTREL) 5-20 MG capsule Take 1 capsule by mouth daily.    [provider]  amoxicillin-clavulanate (AUGMENTIN) 875-125 MG tablet Take 1 tablet by mouth 2 (two) times daily. 01/28/17   Max Sane, MD  celecoxib (CELEBREX) 200 MG capsule Take 200 mg by mouth daily.    [provider]  citalopram (CELEXA) 20 MG tablet Take 20 mg by mouth daily.    [provider]  Difluprednate 0.05 % EMUL Apply 1 drop to eye Nightly. 01/28/17   Max Sane, MD  glimepiride (AMARYL) 2 MG tablet Take 2 mg by mouth daily with breakfast.    [provider]  insulin glargine (LANTUS) 100 UNIT/ML injection Inject 12 Units into the skin at bedtime.    [provider]  insulin lispro (HUMALOG) 100  UNIT/ML injection Inject 40 Units into the skin 2 (two) times daily as needed for high blood sugar.    [provider]  levothyroxine (SYNTHROID, LEVOTHROID) 175 MCG tablet Take 175 mcg by mouth daily before breakfast.    [provider]  linagliptin (TRADJENTA) 5 MG TABS tablet Take 5 mg by mouth daily.    [provider]  loratadine (CLARITIN) 10 MG tablet Take 10 mg by mouth daily.    [provider]  lovastatin (MEVACOR) 20 MG tablet Take 20 mg by mouth at bedtime.    [provider]  magnesium oxide (MAG-OX) 400 MG tablet Take 400 mg by mouth 2 (two) times daily.    [provider]   meclizine (ANTIVERT) 12.5 MG tablet Take 12.5 mg by mouth.    [provider]  metFORMIN (GLUCOPHAGE) 1000 MG tablet Take 1,000 mg by mouth 2 (two) times daily with a meal.    [provider]  METOPROLOL SUCCINATE ER PO Take 100 mg by mouth every morning.    [provider]  moxifloxacin (VIGAMOX) 0.5 % ophthalmic solution Place 1 drop into the left eye 4 (four) times daily. 01/28/17   Max Sane, MD  nepafenac (ILEVRO) 0.3 % ophthalmic suspension Place 1 drop into the left eye daily. 01/28/17   Max Sane, MD  nystatin (MYCOSTATIN) 100000 UNIT/ML suspension Take 5 mLs (500,000 Units total) by mouth 4 (four) times daily. 01/31/17   Max Sane, MD  OxyCODONE HCl, Abuse Deter, (OXAYDO) 5 MG TABA Take 5 mg by mouth 3 (three) times daily as needed.    [provider]  pantoprazole (PROTONIX) 40 MG tablet Take 40 mg by mouth 2 (two) times daily.    [provider]  traMADol (ULTRAM) 50 MG tablet Take 50 mg by mouth 3 (three) times daily as needed.    [provider]  warfarin (COUMADIN) 1 MG tablet Take 0.5 mg by mouth daily at 6 PM.    [provider]  warfarin (COUMADIN) 7.5 MG tablet Take 7.5 mg by mouth daily at 6 PM.    [provider]      PHYSICAL EXAMINATION:   VITAL SIGNS: Blood pressure (!) 146/78, pulse 94, resp. rate 10, height 5\' 6"  (1.676 m), weight (!) 322 lb (146.1 kg), SpO2 90 %.  GENERAL:  61 y.o.-year-old patient lying in the bed Critically ill-appearing EYES: Pupils equal, round, reactive to light and accommodation. No scleral icterus.  HEENT: Head atraumatic, normocephalic. Oropharynx and nasopharynx clear.  NECK:  Supple, no jugular venous distention. No thyroid enlargement, no tenderness.  LUNGS: Rhonchus breath sounds bilaterally no wheezing currently intubated CARDIOVASCULAR: S1, S2 normal. No murmurs, rubs, or gallops.  ABDOMEN: Soft, obese nontender, nondistended. Bowel sounds present. No  organomegaly or mass.  EXTREMITIES: Positive pedal edema, cyanosis, or clubbing.  NEUROLOGIC: Currently not responsive on sedation PSYCHIATRIC: Sedated SKIN: No obvious rash, lesion, or ulcer.   LABORATORY PANEL:   CBC No results for input(s): WBC, HGB, HCT, PLT, MCV, MCH, MCHC, RDW, LYMPHSABS, MONOABS, EOSABS, BASOSABS, BANDABS in the last 168 hours.  Invalid input(s): NEUTRABS, BANDSABD ------------------------------------------------------------------------------------------------------------------  Chemistries  No results for input(s): NA, K, CL, CO2, GLUCOSE, BUN, CREATININE, CALCIUM, MG, AST, ALT, ALKPHOS, BILITOT in the last 168 hours.  Invalid input(s): GFRCGP ------------------------------------------------------------------------------------------------------------------ CrCl cannot be calculated (Patient's most recent lab result is older than the maximum 21 days allowed.). ------------------------------------------------------------------------------------------------------------------ No results for input(s): TSH, T4TOTAL, T3FREE, THYROIDAB in the last 72 hours.  Invalid input(s):  FREET3   Coagulation profile No results for input(s): INR, PROTIME in the last 168 hours. ------------------------------------------------------------------------------------------------------------------- No results for input(s): DDIMER in the last 72 hours. -------------------------------------------------------------------------------------------------------------------  Cardiac Enzymes No results for input(s): CKMB, TROPONINI, MYOGLOBIN in the last 168 hours.  Invalid input(s): CK ------------------------------------------------------------------------------------------------------------------ Invalid input(s): POCBNP  ---------------------------------------------------------------------------------------------------------------  Urinalysis No results found for: COLORURINE,  APPEARANCEUR, LABSPEC, PHURINE, GLUCOSEU, HGBUR, BILIRUBINUR, KETONESUR, PROTEINUR, UROBILINOGEN, NITRITE, LEUKOCYTESUR   RADIOLOGY: Dg Chest Portable 1 View  Result Date: 02/22/2017 CLINICAL DATA:  Endotracheal tube placement EXAM: PORTABLE CHEST 1 VIEW COMPARISON:  01/27/2017 FINDINGS: Endotracheal tube in good position.  NG tip not well seen. Interval worsening of diffuse bilateral airspace disease most prominent right upper lobe. Possible edema versus pneumonia. No pleural effusion. Cardiac enlargement IMPRESSION: Endotracheal tube in good position. Significant worsening in bilateral airspace disease which may represent pneumonia or edema. Electronically Signed   By: Franchot Gallo M.D.   On: 02/22/2017 08:16    EKG: Orders placed or performed during the hospital encounter of 02/22/17  . EKG 12-Lead  . EKG 12-Lead  . ED EKG 12-Lead  . ED EKG 12-Lead    IMPRESSION AND PLAN: Patient is a 61 year old status post respiratory arrest  1. Acute respiratory failure This is due to possible pneumonia or CHF I will check a BNP level Broad-spectrum antibiotics Continue mechanical ventilation Pulmonary consult  2. Status post cardiac arrest Suspect due to her underlying lung issues We'll obtain echocardiogram of the heart Follow cardiac enzymes  3. Diabetes type 2 Place on sliding scale insulin and check blood sugars every 6 hours  4. History of hypertension hold blood pressure medications  5. Hypothyroidism we'll check a TSH IV Synthroid  6. Morbid obesity  7. Miscellaneous heparin for DVT prophylaxis    All the records are reviewed and case discussed with ED provider. Management plans discussed with the patient, family and they are in agreement.  CODE STATUS: Code Status History    Date Active Date Inactive Code Status Order ID Comments User Context   01/26/2017  4:36 PM 01/28/2017 12:34 PM Full Code [id number]  Baxter Hire, MD Inpatient       TOTAL TIME TAKING CARE  OF THIS PATIENT:55  Minutes  critical care time spent Dustin Flock M.D on 02/22/2017 at 8:18 AM  Between 7am to 6pm - Pager - [phone]  After 6pm go to [email or web address] - password [email or web address] [facility]  Office  [phone]  CC: Primary care physician; Sallee Lange, NP

## 2017-02-22 NOTE — ED Triage Notes (Addendum)
Patient from home via [facility]. EMS called out for respiratory distress. Report when they arrived to home, patient was tripoding in bed, reporting difficulty breathing. Patient able to ambulate to stretcher. CPAP applied. EMS reports patient respiratory arrested and then lost pulses on trip to ED. CPR performed and 4 rounds of EPI given. Patient in NSR upon arrival to ED with [facility] Airway in place, being bagged by EMS.

## 2017-02-23 ENCOUNTER — Inpatient Hospital Stay
Admit: 2017-02-23 | Discharge: 2017-02-23 | Disposition: A | Discharge status: Home / Self Care | Payer: Medicare Other | Attending: Internal Medicine | Admitting: Internal Medicine

## 2017-02-23 ENCOUNTER — Inpatient Hospital Stay: Payer: Medicare Other

## 2017-02-23 DIAGNOSIS — R579 Shock, unspecified: Secondary | ICD-10-CM

## 2017-02-23 DIAGNOSIS — J9602 Acute respiratory failure with hypercapnia: Secondary | ICD-10-CM

## 2017-02-23 DIAGNOSIS — N179 Acute kidney failure, unspecified: Secondary | ICD-10-CM

## 2017-02-23 DIAGNOSIS — J189 Pneumonia, unspecified organism: Secondary | ICD-10-CM

## 2017-02-23 LAB — BASIC METABOLIC PANEL
Anion gap: 10 (ref 5–15)
Anion gap: 9 (ref 5–15)
BUN: 56 mg/dL — AB (ref 6–20)
BUN: 59 mg/dL — AB (ref 6–20)
CALCIUM: 7.5 mg/dL — AB (ref 8.9–10.3)
CALCIUM: 7.6 mg/dL — AB (ref 8.9–10.3)
CO2: 25 mmol/L (ref 22–32)
CO2: 27 mmol/L (ref 22–32)
Chloride: 103 mmol/L (ref 101–111)
Chloride: 102 mmol/L (ref 101–111)
Creatinine, Ser: 2.5 mg/dL — ABNORMAL HIGH (ref 0.44–1.00)
Creatinine, Ser: 2.75 mg/dL — ABNORMAL HIGH (ref 0.44–1.00)
GFR calc Af Amer: 23 mL/min — ABNORMAL LOW (ref >60)
GFR calc Af Amer: 20 mL/min — ABNORMAL LOW (ref >60)
GFR, EST NON AFRICAN AMERICAN: 20 mL/min — AB (ref >60)
GFR, EST NON AFRICAN AMERICAN: 18 mL/min — AB (ref >60)
GLUCOSE: 116 mg/dL — AB (ref 65–99)
Glucose, Bld: 316 mg/dL — ABNORMAL HIGH (ref 65–99)
POTASSIUM: 5.3 mmol/L — AB (ref 3.5–5.1)
Potassium: 4.8 mmol/L (ref 3.5–5.1)
SODIUM: 138 mmol/L (ref 135–145)
Sodium: 138 mmol/L (ref 135–145)

## 2017-02-23 LAB — GLUCOSE, CAPILLARY
GLUCOSE-CAPILLARY: 290 mg/dL — AB (ref 65–99)
GLUCOSE-CAPILLARY: 248 mg/dL — AB (ref 65–99)
GLUCOSE-CAPILLARY: 196 mg/dL — AB (ref 65–99)
GLUCOSE-CAPILLARY: 181 mg/dL — AB (ref 65–99)
GLUCOSE-CAPILLARY: 112 mg/dL — AB (ref 65–99)
GLUCOSE-CAPILLARY: 104 mg/dL — AB (ref 65–99)
GLUCOSE-CAPILLARY: 91 mg/dL (ref 65–99)
GLUCOSE-CAPILLARY: 113 mg/dL — AB (ref 65–99)
Glucose-Capillary: 305 mg/dL — ABNORMAL HIGH (ref 65–99)
Glucose-Capillary: 290 mg/dL — ABNORMAL HIGH (ref 65–99)
Glucose-Capillary: 304 mg/dL — ABNORMAL HIGH (ref 65–99)
Glucose-Capillary: 252 mg/dL — ABNORMAL HIGH (ref 65–99)
Glucose-Capillary: 246 mg/dL — ABNORMAL HIGH (ref 65–99)
Glucose-Capillary: 169 mg/dL — ABNORMAL HIGH (ref 65–99)
Glucose-Capillary: 111 mg/dL — ABNORMAL HIGH (ref 65–99)
Glucose-Capillary: 112 mg/dL — ABNORMAL HIGH (ref 65–99)

## 2017-02-23 LAB — BLOOD GAS, ARTERIAL
Acid-base deficit: 2.5 mmol/L — ABNORMAL HIGH (ref 0.0–2.0)
Bicarbonate: 23.7 mmol/L (ref 20.0–28.0)
FIO2: 0.4
O2 Saturation: 96.8 %
PATIENT TEMPERATURE: 37
PEEP: 8 cmH2O
PO2 ART: 96 mmHg (ref 83.0–108.0)
PRESSURE CONTROL: 15 cmH2O
RATE: 14 resp/min
pCO2 arterial: 47 mmHg (ref 32.0–48.0)
pH, Arterial: 7.31 — ABNORMAL LOW (ref 7.350–7.450)

## 2017-02-23 LAB — PROTIME-INR
INR: 5.89
Prothrombin Time: 54.5 seconds — ABNORMAL HIGH (ref 11.4–15.2)

## 2017-02-23 LAB — APTT: APTT: 88 s — AB (ref 24–36)

## 2017-02-23 LAB — POTASSIUM
POTASSIUM: 5.6 mmol/L — AB (ref 3.5–5.1)
Potassium: 5.3 mmol/L — ABNORMAL HIGH (ref 3.5–5.1)

## 2017-02-23 LAB — CBC
HCT: 25.8 % — ABNORMAL LOW (ref 35.0–47.0)
Hemoglobin: 7.6 g/dL — ABNORMAL LOW (ref 12.0–16.0)
MCH: 20.7 pg — AB (ref 26.0–34.0)
MCHC: 29.7 g/dL — ABNORMAL LOW (ref 32.0–36.0)
MCV: 70 fL — ABNORMAL LOW (ref 80.0–100.0)
PLATELETS: 288 10*3/uL (ref 150–440)
RBC: 3.68 MIL/uL — AB (ref 3.80–5.20)
RDW: 20.2 % — AB (ref 11.5–14.5)
WBC: 20.7 10*3/uL — AB (ref 3.6–11.0)

## 2017-02-23 LAB — PROCALCITONIN: PROCALCITONIN: 48.52 ng/mL

## 2017-02-23 LAB — STREP PNEUMONIAE URINARY ANTIGEN: STREP PNEUMO URINARY ANTIGEN: NEGATIVE

## 2017-02-23 LAB — OSMOLALITY, URINE: Osmolality, Ur: 366 mOsm/kg (ref 300–900)

## 2017-02-23 MED ORDER — FAMOTIDINE 20 MG PO TABS
20.0000 mg | ORAL_TABLET | Freq: Every day | ORAL | Status: DC
  Administered 2017-02-23: 20 mg
  Filled 2017-02-23 (×2): qty 1

## 2017-02-23 MED ORDER — VITAL HIGH PROTEIN PO LIQD
1000.0000 mL | ORAL | Status: DC
  Administered 2017-02-23: 1000 mL

## 2017-02-23 MED ORDER — FUROSEMIDE 10 MG/ML IJ SOLN
80.0000 mg | Freq: Once | INTRAMUSCULAR | Status: AC
  Administered 2017-02-23: 80 mg via INTRAVENOUS
  Filled 2017-02-23: qty 8

## 2017-02-23 MED ORDER — SODIUM CHLORIDE 0.9 % IV SOLN
INTRAVENOUS | Status: DC
  Administered 2017-02-23: 2.4 [IU]/h via INTRAVENOUS
  Filled 2017-02-23: qty 1

## 2017-02-23 MED ORDER — INSULIN ASPART 100 UNIT/ML ~~LOC~~ SOLN
3.0000 [IU] | SUBCUTANEOUS | Status: DC
  Administered 2017-02-24 (×2): 3 [IU] via SUBCUTANEOUS
  Filled 2017-02-23 (×2): qty 1

## 2017-02-23 MED ORDER — ALBUTEROL SULFATE (2.5 MG/3ML) 0.083% IN NEBU
2.5000 mg | INHALATION_SOLUTION | Freq: Once | RESPIRATORY_TRACT | Status: AC
  Administered 2017-02-23: 2.5 mg via RESPIRATORY_TRACT
  Filled 2017-02-23: qty 3

## 2017-02-23 MED ORDER — INSULIN GLARGINE 100 UNIT/ML ~~LOC~~ SOLN
20.0000 [IU] | SUBCUTANEOUS | Status: DC
  Administered 2017-02-23: 20 [IU] via SUBCUTANEOUS
  Filled 2017-02-23 (×2): qty 0.2

## 2017-02-23 MED ORDER — INSULIN ASPART 100 UNIT/ML ~~LOC~~ SOLN: 1.0000 [IU] | SUBCUTANEOUS | Status: DC

## 2017-02-23 MED ORDER — FUROSEMIDE 10 MG/ML IJ SOLN
120.0000 mg | Freq: Once | INTRAVENOUS | Status: AC
  Administered 2017-02-23: 120 mg via INTRAVENOUS
  Filled 2017-02-23: qty 12

## 2017-02-23 MED ORDER — SODIUM POLYSTYRENE SULFONATE 15 GM/60ML PO SUSP: 15.0000 g | Freq: Once | ORAL | Status: DC

## 2017-02-23 MED ORDER — PRO-STAT SUGAR FREE PO LIQD
60.0000 mL | Freq: Four times a day (QID) | ORAL | Status: DC
  Administered 2017-02-23 – 2017-02-24 (×4): 60 mL

## 2017-02-23 MED ORDER — PRO-STAT SUGAR FREE PO LIQD
30.0000 mL | Freq: Two times a day (BID) | ORAL | Status: DC
  Administered 2017-02-23: 30 mL

## 2017-02-23 MED ORDER — DEXTROSE 10 % IV SOLN: INTRAVENOUS | Status: DC | PRN

## 2017-02-23 MED FILL — Medication: Qty: 1 | Status: AC

## 2017-02-23 NOTE — Progress Notes (Signed)
Inpatient Diabetes Program Recommendations  AACE/ADA: New Consensus Statement on Inpatient Glycemic Control (2015)  Target Ranges:  Prepandial:   less than 140 mg/dL      Peak postprandial:   less than 180 mg/dL (1-2 hours)      Critically ill patients:  140 - 180 mg/dL   Results for Andrea Rivers, Andrea Rivers (MRN [id number]) as of 02/23/2017 08:48  Ref. Range 02/22/2017 10:08 02/22/2017 13:40 02/22/2017 16:24 02/22/2017 19:45 02/22/2017 23:34 02/23/2017 03:54 02/23/2017 07:05 02/23/2017 07:53  Glucose-Capillary Latest Ref Range: 65 - 99 mg/dL 330 (H) 414 (H) 407 (H) 373 (H) 309 (H) 305 (H) 290 (H) 290 (H)   Review of Glycemic Control  Current orders for Inpatient glycemic control: Novolin R Insulin drip  Inpatient Diabetes Program Recommendations: Insulin - IV drip/GlucoStabilizer: Noted order for IV nsulin today. Please discontinue current individual order and use ICU Glycemic Control order set to order Phase 2 (IV insulin). Using the order set will allow nursing to transition from IV to SQ when parameters are met.  Thanks, Barnie Alderman, RN, MSN, CDE Diabetes Coordinator Inpatient Diabetes Program [phone] (Team Pager from 8am to 5pm)

## 2017-02-23 NOTE — Progress Notes (Signed)
CONCERNING: IV to Oral Route Change Policy  RECOMMENDATION: This patient is receiving famotidine by the intravenous route.  Based on criteria approved by the Pharmacy and Therapeutics Committee, the intravenous medication(s) is/are being converted to the equivalent oral dose form(s).   DESCRIPTION: These criteria include:  The patient is eating (either orally or via tube) and/or has been taking other orally administered medications for a least 24 hours  The patient has no evidence of active gastrointestinal bleeding or impaired GI absorption (gastrectomy, short bowel, patient on TNA or NPO).  If you have questions about this conversion, please contact the Pharmacy Department  []   [phone] )  Andrea Rivers [x]   [phone] )  Andrea Rivers []   [phone] )  Andrea Rivers []   [phone] )  [name] []   [phone] )  [name], [name] 02/23/2017 1:43 PM

## 2017-02-23 NOTE — Progress Notes (Signed)
NP [place] was notified of critical PTT and INR value. No further order at this time. Will continue to monitor patient.

## 2017-02-23 NOTE — Progress Notes (Signed)
Called by patient's RN that the patient's vent was alarming low minute ventilation. Patient's end tidal CO2 monitor has risen from 51 to 59 after the morning vent change. Patient minute volume consistently reading 6.5, informed Dr. Alva Garnet of the minute ventilation, rate increased to 18.

## 2017-02-23 NOTE — Progress Notes (Signed)
Per protocol ABG was to be obtained after change made to vent. ABG discontinued after PEEP had been increased to 18, Dr. Alva Garnet stated  patient saturations were 92% which were good and an ABG was not needed at this time.

## 2017-02-23 NOTE — Progress Notes (Signed)
PULMONARY / CRITICAL CARE MEDICINE   Name: Andrea Rivers MRN: [id number] DOB: 15-Oct-1955    ADMISSION DATE:  02/22/2017  PT PROFILE: 61 y.o. F with multiple medical problems including severe obesity awoke in the middle of the night with dyspnea. Her husband called EMS. She suffered cardiopulmonary arrest en route to the emergency department. She underwent resuscitation and ultimately intubation in the emergency department. Upon arrival to the ICU, she again suffered PEA arrest. She is on chronic warfarin for unknown reasons with admission INR of 4.33.   MAJOR EVENTS/TEST RESULTS: 07/17 admitted via ED with cardiopulmonary arrest 2, severe hypoxemia, persistent shock. 07/18 echocardiogram: 07/18 LE venous US: No evidence of DVT within either lower extremity 07/18 Reducing vasopressor requirements, reducing ventilatory support requirements, improving Uo  INDWELLING DEVICES:: ETT 07/17 >>  R IJ CVL 07/17 >>   MICRO DATA: MRSA PCR 07/17 >> NEG Strep antigen 07/17 >> NEG Legionella antigen 07/17 >>  Urine 07/17 >>  Resp 07/17 >>  Blood 07/17 >>   ANTIMICROBIALS:  Vancomycin 07/17 >> 07/18 Cefepime 07/17 >>   PCT 07/7: <0.10, 48.52,    SUBJECTIVE:  + F/C on WUA per report. Synchronous with vent after adjustments made  VITAL SIGNS: BP (!) 90/51   Pulse 60   Temp 99.1 F (37.3 C)   Resp (!) 26   Ht 5\' 6"  (1.676 m)   Wt (!) 322 lb (146.1 kg)   SpO2 97%   BMI 51.97 kg/m   HEMODYNAMICS:    VENTILATOR SETTINGS: Vent Mode: PCV FiO2 (%):  [40 %-100 %] 40 % Set Rate:  [14 bmp-35 bmp] 14 bmp Vt Set:  [360 mL] 360 mL PEEP:  [8 ZGY17-49 cmH20] 8 cmH20 Plateau Pressure:  [23 cmH20-31 cmH20] 23 cmH20  INTAKE / OUTPUT: I/O last 3 completed shifts: In: 1115.7 [I.V.:1115.7] Out: 230 [Urine:230]  PHYSICAL EXAMINATION: General: RASS -2, -3, intermitently F/C Neuro: PERRL, EOMI, MAEs HEENT: NCAT, sclerae white Cardiovascular: NSR, no murmurs Lungs: No adventitious  sounds noted anteriorly Abdomen: Extremely obese, diminished to absent bowel sounds, soft Extremities: Cool, no edema, diminished pulses  LABS:  BMET  Recent Labs Lab 02/22/17 1706 02/22/17 1859  02/23/17 0059 02/23/17 0445 02/23/17 0826  NA 137 135  --   --  138  --   K 5.9* 5.8*  < > 5.6* 5.3* 5.3*  CL 105 102  --   --  103  --   CO2 23 22  --   --  25  --   BUN 49* 51*  --   --  56*  --   CREATININE 2.32* 2.34*  --   --  2.50*  --   GLUCOSE 422* 390*  --   --  316*  --   < > = values in this interval not displayed.  Electrolytes  Recent Labs Lab 02/22/17 1706 02/22/17 1859 02/23/17 0445  CALCIUM 7.7* 7.8* 7.5*  MG 2.2  --   --     CBC  Recent Labs Lab 02/22/17 0755 02/22/17 1040 02/23/17 0445  WBC 16.1* 34.1* 20.7*  HGB 8.6* 8.3* 7.6*  HCT 30.2* 29.4* 25.8*  PLT 373 377 288    Coag's  Recent Labs Lab 02/22/17 0755 02/23/17 0445  APTT 49* 88*  INR 4.33* 5.89*    Sepsis Markers  Recent Labs Lab 02/22/17 0755 02/22/17 0800 02/22/17 1038 02/23/17 0445  LATICACIDVEN  --  9.4* 4.3*  --   PROCALCITON <0.10  --   --  48.52    ABG  Recent Labs Lab 02/22/17 1130 02/22/17 1340 02/23/17 1130  PHART 7.26* 7.20* 7.31*  PCO2ART 40 53* 47  PO2ART 63* 51* 96    Liver Enzymes  Recent Labs Lab 02/22/17 0755  AST 38  ALT 17  ALKPHOS 87  BILITOT 0.5  ALBUMIN 3.5    Cardiac Enzymes  Recent Labs Lab 02/22/17 1040 02/22/17 1706 02/22/17 2243  TROPONINI 0.04* 0.08* 0.09*    Glucose  Recent Labs Lab 02/23/17 0705 02/23/17 0753 02/23/17 0920 02/23/17 1046 02/23/17 1143 02/23/17 1244  GLUCAP 290* 290* 304* 252* 248* 246*    CXR: Cardiomegaly, L>R AS dz   ASSESSMENT / PLAN:  PULMONARY A: Acute respiratory failure with hypoxemia Ventilator dependence Suspected pneumonia, NOS ARDS versus pulmonary edema P:   Cont full vent support - settings reviewed and/or adjusted Cont vent bundle Daily SBT if/when meets  criteria  CARDIOVASCULAR A:  Cardiac arrest 2 07/17 (PEA) Minimally elevated trop I - doubt acute coronary syndrome Shock, presumed septic in origin Recent episode of atrial fibrillation - NSR presently Relative bradycardia P:  Cont NE to maintain MAP goal > 65 mmHg F/U Echocardiogram results  RENAL A:   AKI, oliguric Mild metabolic acidosis Hyperkalemia P:   Monitor BMET intermittently Monitor I/Os Correct electrolytes as indicated Cont HCO3 infusion Furosemide X 1 07/18  GASTROINTESTINAL A:   Severe obesity P:   SUP: IV famotidine Begin TFs - initiated 07/18  HEMATOLOGIC A:   Anemia without evidence of acute blood loss Chronic warfarin therapy with prolonged INR P:  DVT px: SCDs Monitor CBC intermittently Transfuse per usual guidelines Holding warfarin Monitor prothrombin time  INFECTIOUS A:   Suspected severe sepsis Elevated PCT Suspected HCAP, NOS P:   Monitor temp, WBC count Micro and abx as above  ENDOCRINE A:   DM 2 with severe hyperglycemia P:   Initiate insulin infusion 07/18  NEUROLOGIC A:   Post-anoxic encephalopathy ICU/vent associated discomfort P:   RASS goal: -1, -2 Cont fentanyl/propofol infusions Daily WUA   FAMILY: Husband updated in detail at bedside   CCM time:  40 mins The above time includes time spent in consultation with patient and/or family members and reviewing care plan on multidisciplinary rounds  Merton Border, MD PCCM service Mobile [phone] Pager [phone]   02/23/2017, 1:54 PM

## 2017-02-23 NOTE — Progress Notes (Signed)
*  PRELIMINARY RESULTS* Echocardiogram 2D Echocardiogram has been performed.  Andrea Rivers 02/23/2017, 1:32 PM

## 2017-02-23 NOTE — Progress Notes (Signed)
[place] at [facility]                                                                                                                                                                                  Patient Demographics   Andrea Rivers, is a 61 y.o. female, DOB - 04-Aug-1956, UKG:[id number]  Admit date - 02/22/2017   Admitting Physician Dustin Flock, MD  Outpatient Primary MD for the patient is Gauger, Victoriano Lain, NP   LOS - 1  Subjective: Patient remains intubated Currently on 100% oxygen   Review of Systems:   CONSTITUTIONAL: Intubated and sedated Vitals:   Vitals:   02/23/17 1100 02/23/17 1200 02/23/17 1300 02/23/17 1400  BP: (!) 116/55 (!) 106/55 (!) 90/51 (!) 90/45  Pulse: 73 64 60 (!) 56  Resp: 13 12 (!) 26 18  Temp: 99 F (37.2 C) 99.1 F (37.3 C) 99.1 F (37.3 C) 99 F (37.2 C)  TempSrc:  Core (Comment)    SpO2: 98% 99% 97% 99%  Weight:      Height:        Wt Readings from Last 3 Encounters:  02/22/17 (!) 322 lb (146.1 kg)  01/26/17 299 lb 6.2 oz (135.8 kg)     Intake/Output Summary (Last 24 hours) at 02/23/17 1441 Last data filed at 02/23/17 1400  Gross per 24 hour  Intake          2233.35 ml  Output              280 ml  Net          1953.35 ml    Physical Exam:   GENERAL: Critically ill-appearing HEAD, EYES, EARS, NOSE AND THROAT: Atraumatic, normocephalic.. Pupils equal and reactive to light. Sclerae anicteric. No conjunctival injection. No oro-pharyngeal erythema.  NECK: Supple. There is no jugular venous distention. No bruits, no lymphadenopathy, no thyromegaly.  HEART: Regular rate and rhythm,. No murmurs, no rubs, no clicks.  LUNGS: Decreased breath sounds on the ventilator No rales or rhonchi. No wheezes.  ABDOMEN: Soft, flat, nontender, nondistended. Has good bowel sounds. No hepatosplenomegaly appreciated.  EXTREMITIES: No evidence of any cyanosis, clubbing, or peripheral edema.  +2 pedal and radial pulses  bilaterally.  NEUROLOGIC: Sedated SKIN: Moist and warm with no rashes appreciated.  Psych: Sedated  LN: No inguinal LN enlargement    Antibiotics   Anti-infectives    Start     Dose/Rate Route Frequency Ordered Stop   02/22/17 1500  ceFEPIme (MAXIPIME) 2 g in dextrose 5 % 50 mL IVPB     2 g 100 mL/hr over 30 Minutes Intravenous Every 12 hours 02/22/17 1127  02/22/17 1400  vancomycin (VANCOCIN) 1,250 mg in sodium chloride 0.9 % 250 mL IVPB  Status:  Discontinued     1,250 mg 166.7 mL/hr over 90 Minutes Intravenous Every 18 hours 02/22/17 0842 02/23/17 0839   02/22/17 1200  azithromycin (ZITHROMAX) 500 mg in dextrose 5 % 250 mL IVPB     500 mg 250 mL/hr over 60 Minutes Intravenous Every 24 hours 02/22/17 1113     02/22/17 0845  piperacillin-tazobactam (ZOSYN) IVPB 4.5 g  Status:  Discontinued     4.5 g 200 mL/hr over 30 Minutes Intravenous Every 8 hours 02/22/17 0842 02/22/17 1113   02/22/17 0830  vancomycin (VANCOCIN) IVPB 1000 mg/200 mL premix     1,000 mg 200 mL/hr over 60 Minutes Intravenous  Once 02/22/17 0823 02/22/17 1030   02/22/17 0815  vancomycin (VANCOCIN) injection 2,000 mg  Status:  Discontinued     2,000 mg Intravenous  Once 02/22/17 0804 02/22/17 0823   02/22/17 0815  piperacillin-tazobactam (ZOSYN) IVPB 3.375 g     3.375 g 100 mL/hr over 30 Minutes Intravenous  Once 02/22/17 0804 02/22/17 0925      Medications   Scheduled Meds: . atropine  1 mg Intravenous Once  . chlorhexidine gluconate (MEDLINE KIT)  15 mL Mouth Rinse BID  . dextrose  1 ampule Intravenous Once  . famotidine  20 mg Per Tube QHS  . feeding supplement (PRO-STAT SUGAR FREE 64)  60 mL Per Tube QID  . feeding supplement (VITAL HIGH PROTEIN)  1,000 mL Per Tube Q24H  . mouth rinse  15 mL Mouth Rinse QID   Continuous Infusions: . azithromycin Stopped (02/23/17 1250)  . ceFEPime (MAXIPIME) IV Stopped (02/23/17 0338)  . fentaNYL infusion INTRAVENOUS 130 mcg/hr (02/23/17 1400)  . insulin  (NOVOLIN-R) infusion 6.9 mL/hr at 02/23/17 1400  . norepinephrine (LEVOPHED) Adult infusion 6 mcg/min (02/23/17 1420)  . propofol (DIPRIVAN) infusion 24.983 mcg/kg/min (02/23/17 1400)  .  sodium bicarbonate (isotonic) infusion in sterile water 50 mL/hr at 02/23/17 1400   PRN Meds:.acetaminophen **OR** acetaminophen, [DISCONTINUED] ondansetron **OR** ondansetron (ZOFRAN) IV   Data Review:   Micro Results Recent Results (from the past 240 hour(s))  Blood Culture (routine x 2)     Status: None (Preliminary result)   Collection Time: 02/22/17  7:55 AM  Result Value Ref Range Status   Specimen Description BLOOD RIGHT AV  Final   Special Requests   Final    BOTTLES DRAWN AEROBIC AND ANAEROBIC Blood Culture results may not be optimal due to an excessive volume of blood received in culture bottles   Culture NO GROWTH < 24 HOURS  Final   Report Status PENDING  Incomplete  Blood Culture (routine x 2)     Status: None (Preliminary result)   Collection Time: 02/22/17  8:15 AM  Result Value Ref Range Status   Specimen Description BLOOD RIGHT HAND  Final   Special Requests   Final    BOTTLES DRAWN AEROBIC AND ANAEROBIC Blood Culture results may not be optimal due to an excessive volume of blood received in culture bottles   Culture NO GROWTH < 24 HOURS  Final   Report Status PENDING  Incomplete  Culture, respiratory (NON-Expectorated)     Status: None (Preliminary result)   Collection Time: 02/22/17 10:00 AM  Result Value Ref Range Status   Specimen Description TRACHEAL ASPIRATE  Final   Special Requests Normal  Final   Gram Stain   Final    FEW WBC PRESENT,BOTH PMN  AND MONONUCLEAR NO ORGANISMS SEEN    Culture   Final    CULTURE REINCUBATED FOR BETTER GROWTH Performed at [facility], [place] [address], [place], [place] [address]    Report Status PENDING  Incomplete  MRSA PCR Screening     Status: None   Collection Time: 02/22/17 10:50 AM  Result Value Ref Range Status   MRSA by PCR  NEGATIVE NEGATIVE Final    Comment:        The GeneXpert MRSA Assay (FDA approved for NASAL specimens only), is one component of a comprehensive MRSA colonization surveillance program. It is not intended to diagnose MRSA infection nor to guide or monitor treatment for MRSA infections.     Radiology Reports Dg Chest 1 View  Result Date: 01/27/2017 CLINICAL DATA:  Initial evaluation for acute hypoxia. EXAM: CHEST 1 VIEW COMPARISON:  Prior radiograph from 01/26/2017. FINDINGS: Stable cardiomegaly.  Mediastinal silhouette within normal limits. Lungs normally inflated. Diffuse pulmonary vascular congestion with interstitial edema, improved from previous. No definite pleural effusion. Slight asymmetric opacity within the left lung may reflect edema and/ or infiltrate No pneumothorax. Osseous structures unchanged. IMPRESSION: 1. Stable cardiomegaly with persistent but improved pulmonary edema as compared 01/26/2017. 2. Persistent slight asymmetric opacity within the left lung as compared to the right. While this may be related to edema, underlying infiltrate could be considered in the correct clinical setting. Electronically Signed   By: Jeannine Boga M.D.   On: 01/27/2017 05:50   Dg Chest 2 View  Result Date: 01/27/2017 CLINICAL DATA:  Shortness of breath and pulmonary edema EXAM: CHEST  2 VIEW COMPARISON:  01/27/2017 and prior exams FINDINGS: Cardiomegaly with pulmonary edema is unchanged. No definite pleural effusions noted. There is no evidence of pneumothorax or acute bony abnormality. IMPRESSION: No significant change in cardiomegaly and pulmonary edema. Electronically Signed   By: Margarette Canada M.D.   On: 01/27/2017 10:38   US Venous Img Lower Bilateral  Result Date: 02/23/2017 CLINICAL DATA:  Bilateral lower extremity edema. EXAM: BILATERAL LOWER EXTREMITY VENOUS DOPPLER ULTRASOUND TECHNIQUE: Gray-scale sonography with graded compression, as well as color Doppler and duplex ultrasound  were performed to evaluate the lower extremity deep venous systems from the level of the common femoral vein and including the common femoral, femoral, profunda femoral, popliteal and calf veins including the posterior tibial, peroneal and gastrocnemius veins when visible. The superficial great saphenous vein was also interrogated. Spectral Doppler was utilized to evaluate flow at rest and with distal augmentation maneuvers in the common femoral, femoral and popliteal veins. COMPARISON:  Ultrasound 06/13/2007. FINDINGS: RIGHT LOWER EXTREMITY Common Femoral Vein: No evidence of thrombus. Normal compressibility, respiratory phasicity and response to augmentation. Saphenofemoral Junction: No evidence of thrombus. Normal compressibility and flow on color Doppler imaging. Profunda Femoral Vein: No evidence of thrombus. Normal compressibility and flow on color Doppler imaging. Femoral Vein: No evidence of thrombus. Normal compressibility, respiratory phasicity and response to augmentation. Popliteal Vein: No evidence of thrombus. Normal compressibility, respiratory phasicity and response to augmentation. Calf Veins: No evidence of thrombus. Normal compressibility and flow on color Doppler imaging. Limited visualization due to edema and patient clinical condition . Superficial Great Saphenous Vein: No evidence of thrombus. Normal compressibility and flow on color Doppler imaging. Other Findings:  None. LEFT LOWER EXTREMITY Common Femoral Vein: No evidence of thrombus. Normal compressibility, respiratory phasicity and response to augmentation. Saphenofemoral Junction: No evidence of thrombus. Normal compressibility and flow on color Doppler imaging. Profunda Femoral Vein: No  evidence of thrombus. Normal compressibility and flow on color Doppler imaging. Femoral Vein: No evidence of thrombus. Normal compressibility, respiratory phasicity and response to augmentation. Popliteal Vein: No evidence of thrombus. Normal  compressibility, respiratory phasicity and response to augmentation. Calf Veins: No evidence of thrombus. Normal compressibility and flow on color Doppler imaging. Limited visualization due to edema and patient clinical condition . Superficial Great Saphenous Vein: No evidence of thrombus. Normal compressibility and flow on color Doppler imaging. Other Findings:  None. IMPRESSION: No evidence of DVT within either lower extremity. Electronically Signed   By: Marcello Moores  Register   On: 02/23/2017 12:51   Dg Chest Port 1 View  Result Date: 02/23/2017 CLINICAL DATA:  Shortness of breath. EXAM: PORTABLE CHEST 1 VIEW COMPARISON:  02/22/2017. FINDINGS: Surgical clips right chest. Endotracheal tube and right IJ line stable position. NG tube tip remains projected over the lower esophagus, advancement of approximately 20 cm should be considered. Patchy infiltrates in the right lung have improved. Diffuse left lung infiltrate remains. Basilar atelectasis. No prominent pleural effusion. No pneumothorax. Stable cardiomegaly . IMPRESSION: 1. NG tube tip again noted over the lower esophagus. Advancement of approximately 20 cm should be considered. Endotracheal tube and right IJ line stable position. 2. Partial clearing of patchy infiltrates right lung. Diffuse left lung infiltrate remains. Low lung volumes. 3. Stable cardiomegaly. Critical Value/emergent results were called by telephone at the time of interpretation on 02/23/2017 at 6:44 am to nurse Pamala Hurry , who verbally acknowledged these results. Electronically Signed   By: Marcello Moores  Register   On: 02/23/2017 06:48   Dg Chest Port 1 View  Result Date: 02/22/2017 CLINICAL DATA:  61 year old female post CPR.  Subsequent encounter. EXAM: PORTABLE CHEST 1 VIEW COMPARISON:  02/22/2017 8:52 a.m. FINDINGS: Endotracheal tube tip 3.6 cm above the carina. Endotracheal tube tip mid to distal esophagus level with side hole proximal to mid esophagus level. This needs to be reposition.  Right internal jugular catheter tip mid superior vena cava level. Cardiomegaly. Patchy consolidation most notable right upper lobe, left midlung zone and right perihilar region. Question multifocal pneumonia versus asymmetric pulmonary edema. No gross pneumothorax.  Evaluation limited by overlying structures. No obvious rib fracture. IMPRESSION: Endotracheal tube tip 3.6 cm above the carina. Endotracheal tube tip mid to distal esophagus level with side hole proximal to mid esophagus level. This needs to be reposition. Right internal jugular catheter tip mid superior vena cava level. Cardiomegaly. Patchy consolidation most notable right upper lobe, left midlung zone and right perihilar region. Question multifocal pneumonia versus asymmetric pulmonary edema. No gross pneumothorax.  Evaluation limited by overlying structures. Electronically Signed   By: Genia Del M.D.   On: 02/22/2017 10:52   Dg Chest Portable 1 View  Result Date: 02/22/2017 CLINICAL DATA:  Hypoxia EXAM: PORTABLE CHEST 1 VIEW COMPARISON:  Study obtained earlier in the day. FINDINGS: Endotracheal tube tip is 3.2 cm above the carina. No pneumothorax. There is interstitial pulmonary edema with bibasilar atelectasis. No airspace consolidation. There is cardiomegaly with pulmonary venous hypertension. There is aortic atherosclerosis. No adenopathy. No bone lesions. IMPRESSION: Endotracheal tube as described without pneumothorax. Changes of congestive heart failure. No consolidation. There is aortic atherosclerosis. Aortic Atherosclerosis (ICD10-I70.0). Electronically Signed   By: Lowella Grip III M.D.   On: 02/22/2017 09:06   Dg Chest Portable 1 View  Result Date: 02/22/2017 CLINICAL DATA:  Endotracheal tube placement EXAM: PORTABLE CHEST 1 VIEW COMPARISON:  01/27/2017 FINDINGS: Endotracheal tube in good position.  NG tip not  well seen. Interval worsening of diffuse bilateral airspace disease most prominent right upper lobe. Possible edema  versus pneumonia. No pleural effusion. Cardiac enlargement IMPRESSION: Endotracheal tube in good position. Significant worsening in bilateral airspace disease which may represent pneumonia or edema. Electronically Signed   By: Franchot Gallo M.D.   On: 02/22/2017 08:16   Dg Chest Port 1 View  Result Date: 01/26/2017 CLINICAL DATA:  Shortness breath.  History of diabetes and edema. EXAM: PORTABLE CHEST 1 VIEW COMPARISON:  Chest radiograph January 08, 2016 FINDINGS: Cardiac silhouette is mildly enlarged and unchanged. Calcified aortic knob. Interstitial and predominately LEFT lung alveolar airspace opacities. No pleural effusion. No pneumothorax. Bronchitic changes. Soft tissue planes and included osseous structures are nonsuspicious. Large body habitus. IMPRESSION: Interstitial and alveolar airspace opacities can be seen with pulmonary edema or infection. Stable cardiomegaly. Electronically Signed   By: Elon Alas M.D.   On: 01/26/2017 13:34   Dg Abd Portable 1v  Result Date: 02/23/2017 CLINICAL DATA:  Nasogastric tube placement EXAM: PORTABLE ABDOMEN - 1 VIEW COMPARISON:  Yesterday FINDINGS: Nasogastric tube tip overlaps the distal stomach. Central line with tip at the upper cavoatrial junction. Chest x-ray was obtained earlier today. Stable bowel gas pattern with mild gaseous distention of right colon and distal stomach. Cholecystectomy clips. Artifact from defibrillator pads. IMPRESSION: Nasogastric tube tip overlaps the  distal stomach. Electronically Signed   By: Monte Fantasia M.D.   On: 02/23/2017 09:25   Dg Abd Portable 1v  Result Date: 02/22/2017 CLINICAL DATA:  Orogastric tube placement EXAM: PORTABLE ABDOMEN - 1 VIEW COMPARISON:  None. FINDINGS: No appreciable orogastric tube. There is mild dilatation in the transverse colon region. No small bowel dilatation. No air-fluid level. No free air evident. There are surgical clips the right upper quadrant. IMPRESSION: No orogastric tube evident.  Question a degree of colonic ileus. There does not appear to be bowel obstruction or free air. Electronically Signed   By: Lowella Grip III M.D.   On: 02/22/2017 10:51     CBC  Recent Labs Lab 02/22/17 0755 02/22/17 1040 02/23/17 0445  WBC 16.1* 34.1* 20.7*  HGB 8.6* 8.3* 7.6*  HCT 30.2* 29.4* 25.8*  PLT 373 377 288  MCV 72.9* 71.5* 70.0*  MCH 20.8* 20.2* 20.7*  MCHC 28.5* 28.2* 29.7*  RDW 20.7* 20.2* 20.2*  LYMPHSABS 2.7  --   --   MONOABS 1.4*  --   --   EOSABS 0.2  --   --   BASOSABS 0.2*  --   --     Chemistries   Recent Labs Lab 02/22/17 0755 02/22/17 1040 02/22/17 1706 02/22/17 1859 02/22/17 2243 02/23/17 0059 02/23/17 0445 02/23/17 0826  NA 139 138 137 135  --   --  138  --   K 4.3 5.1 5.9* 5.8* 5.8* 5.6* 5.3* 5.3*  CL 103 106 105 102  --   --  103  --   CO2 20* 20* 23 22  --   --  25  --   GLUCOSE 356* 415* 422* 390*  --   --  316*  --   BUN 37* 43* 49* 51*  --   --  56*  --   CREATININE 2.11* 2.16* 2.32* 2.34*  --   --  2.50*  --   CALCIUM 8.6* 7.9* 7.7* 7.8*  --   --  7.5*  --   MG  --   --  2.2  --   --   --   --   --  AST 38  --   --   --   --   --   --   --   ALT 17  --   --   --   --   --   --   --   ALKPHOS 87  --   --   --   --   --   --   --   BILITOT 0.5  --   --   --   --   --   --   --    ------------------------------------------------------------------------------------------------------------------ estimated creatinine clearance is 35.1 mL/min (A) (by C-G formula based on SCr of 2.5 mg/dL (H)). ------------------------------------------------------------------------------------------------------------------ No results for input(s): HGBA1C in the last 72 hours. ------------------------------------------------------------------------------------------------------------------  Recent Labs  02/22/17 1706  TRIG 68    ------------------------------------------------------------------------------------------------------------------  Recent Labs  02/22/17 1040  TSH 0.191*   ------------------------------------------------------------------------------------------------------------------ No results for input(s): VITAMINB12, FOLATE, FERRITIN, TIBC, IRON, RETICCTPCT in the last 72 hours.  Coagulation profile  Recent Labs Lab 02/22/17 0755 02/23/17 0445  INR 4.33* 5.89*    No results for input(s): DDIMER in the last 72 hours.  Cardiac Enzymes  Recent Labs Lab 02/22/17 1040 02/22/17 1706 02/22/17 2243  TROPONINI 0.04* 0.08* 0.09*   ------------------------------------------------------------------------------------------------------------------ Invalid input(s): POCBNP    Assessment & Plan   IMPRESSION AND PLAN: Patient is a 61 year old status post respiratory arrest  1. Acute respiratory failure Due to pneumonia- continue IV cefepime with possible ARDS Continue ventilator support as per pulmonary  2. Status post cardiac arrest Suspect due to her underlying lung issues   3. Diabetes type 2 continue sliding scale insulin  4. History of hypertensionnow hypotensive report of care  5. Hypothyroidism  TSH is suppressed   6. Morbid obesity  7.Acute renal failure on chronic kidney disease nephrology consult pending       Code Status Orders        Start     Ordered   02/22/17 1004  Full code  Continuous     02/22/17 1003    Code Status History    Date Active Date Inactive Code Status Order ID Comments User Context   01/26/2017  4:36 PM 01/28/2017 12:34 PM Full Code [id number]  Baxter Hire, MD Inpatient           Consults Pulmonary critical care, nephrology  DVT Prophylaxis SCDs  Lab Results  Component Value Date   PLT 288 02/23/2017     Time Spent in minutes 61mn Greater than 50% of time spent in care coordination and counseling patient  regarding the condition and plan of care.   PDustin FlockM.D on 02/23/2017 at 2:41 PM  Between 7am to 6pm - Pager - [phone]  After 6pm go to [email or web address] - password [email or web address]   Office  [phone]

## 2017-02-23 NOTE — Progress Notes (Signed)
[facility]  ROUNDING NOTE   Subjective:  Patient well known to [place] from the office. We recently evaluated her for chronic kidney disease stage IV. She was recently taking NSAIDs at home. She has now suffered cardiac arrest and is on the ventilator. Her kidney function is a bit worse as compared to her most recent outpatient values. Her most recent outpatient EGFR was 29. Patient also has mild hyperkalemia with a serum potassium of 5.3 at the moment. Pro-calcitonin appears to be high at 48.   Objective:  Vital signs in last 24 hours:  Temp:  [97.2 F (36.2 C)-98.6 F (37 C)] 98.2 F (36.8 C) (07/18 0800) Pulse Rate:  [45-86] 78 (07/18 0800) Resp:  [12-35] 20 (07/18 0800) BP: (79-167)/(46-105) 124/70 (07/18 0800) SpO2:  [84 %-100 %] 100 % (07/18 0800) FiO2 (%):  [40 %-100 %] 40 % (07/18 0835)  Weight change:  Filed Weights   02/22/17 0813  Weight: (!) 146.1 kg (322 lb)    Intake/Output: I/O last 3 completed shifts: In: 1115.7 [I.V.:1115.7] Out: 230 [Urine:230]   Intake/Output this shift:  Total I/O In: 400.9 [I.V.:150.9; IV Piggyback:250] Out: 50 [Urine:50]  Physical Exam: General: Critically ill appearing   Head: NG in place, ETT in place   Eyes: Anicteric  Neck: Supple, trachea midline  Lungs:  Bilateral rhonchi, breath sounds vent assisted   Heart: S1S2 no rubs  Abdomen:  Soft, nontender, bowel sounds present  Extremities: 1+ peripheral edema.  Neurologic: Intubated, sedated   Skin: No lesions       Basic Metabolic Panel:  Recent Labs Lab 02/22/17 0755 02/22/17 1040 02/22/17 1706 02/22/17 1859 02/22/17 2243 02/23/17 0059 02/23/17 0445 02/23/17 0826  NA 139 138 137 135  --   --  138  --   K 4.3 5.1 5.9* 5.8* 5.8* 5.6* 5.3* 5.3*  CL 103 106 105 102  --   --  103  --   CO2 20* 20* 23 22  --   --  25  --   GLUCOSE 356* 415* 422* 390*  --   --  316*  --   BUN 37* 43* 49* 51*  --   --  56*  --   CREATININE 2.11* 2.16* 2.32* 2.34*  --    --  2.50*  --   CALCIUM 8.6* 7.9* 7.7* 7.8*  --   --  7.5*  --   MG  --   --  2.2  --   --   --   --   --     Liver Function Tests:  Recent Labs Lab 02/22/17 0755  AST 38  ALT 17  ALKPHOS 87  BILITOT 0.5  PROT 7.8  ALBUMIN 3.5   No results for input(s): LIPASE, AMYLASE in the last 168 hours. No results for input(s): AMMONIA in the last 168 hours.  CBC:  Recent Labs Lab 02/22/17 0755 02/22/17 1040 02/23/17 0445  WBC 16.1* 34.1* 20.7*  NEUTROABS 11.6*  --   --   HGB 8.6* 8.3* 7.6*  HCT 30.2* 29.4* 25.8*  MCV 72.9* 71.5* 70.0*  PLT 373 377 288    Cardiac Enzymes:  Recent Labs Lab 02/22/17 0755 02/22/17 1040 02/22/17 1706 02/22/17 2243  TROPONINI <0.03 0.04* 0.08* 0.09*    BNP: Invalid input(s): POCBNP  CBG:  Recent Labs Lab 02/22/17 1945 02/22/17 2334 02/23/17 0354 02/23/17 0705 02/23/17 0753  GLUCAP 373* 309* 305* 290* 290*    Microbiology: Results for orders placed or performed  during the hospital encounter of 02/22/17  Blood Culture (routine x 2)     Status: None (Preliminary result)   Collection Time: 02/22/17  7:55 AM  Result Value Ref Range Status   Specimen Description BLOOD RIGHT AV  Final   Special Requests   Final    BOTTLES DRAWN AEROBIC AND ANAEROBIC Blood Culture results may not be optimal due to an excessive volume of blood received in culture bottles   Culture NO GROWTH < 24 HOURS  Final   Report Status PENDING  Incomplete  Blood Culture (routine x 2)     Status: None (Preliminary result)   Collection Time: 02/22/17  8:15 AM  Result Value Ref Range Status   Specimen Description BLOOD RIGHT HAND  Final   Special Requests   Final    BOTTLES DRAWN AEROBIC AND ANAEROBIC Blood Culture results may not be optimal due to an excessive volume of blood received in culture bottles   Culture NO GROWTH < 24 HOURS  Final   Report Status PENDING  Incomplete  Culture, respiratory (NON-Expectorated)     Status: None (Preliminary result)    Collection Time: 02/22/17 10:00 AM  Result Value Ref Range Status   Specimen Description TRACHEAL ASPIRATE  Final   Special Requests Normal  Final   Gram Stain   Final    FEW WBC PRESENT,BOTH PMN AND MONONUCLEAR NO ORGANISMS SEEN    Culture PENDING  Incomplete   Report Status PENDING  Incomplete  MRSA PCR Screening     Status: None   Collection Time: 02/22/17 10:50 AM  Result Value Ref Range Status   MRSA by PCR NEGATIVE NEGATIVE Final    Comment:        The GeneXpert MRSA Assay (FDA approved for NASAL specimens only), is one component of a comprehensive MRSA colonization surveillance program. It is not intended to diagnose MRSA infection nor to guide or monitor treatment for MRSA infections.     Coagulation Studies:  Recent Labs  02/22/17 0755 02/23/17 0445  LABPROT 42.6* 54.5*  INR 4.33* 5.89*    Urinalysis:  Recent Labs  02/22/17 1126  COLORURINE YELLOW*  LABSPEC 1.011  PHURINE 5.0  GLUCOSEU >=500*  HGBUR NEGATIVE  BILIRUBINUR NEGATIVE  KETONESUR NEGATIVE  PROTEINUR 100*  NITRITE NEGATIVE  LEUKOCYTESUR NEGATIVE      Imaging: Dg Chest Port 1 View  Result Date: 02/23/2017 CLINICAL DATA:  Shortness of breath. EXAM: PORTABLE CHEST 1 VIEW COMPARISON:  02/22/2017. FINDINGS: Surgical clips right chest. Endotracheal tube and right IJ line stable position. NG tube tip remains projected over the lower esophagus, advancement of approximately 20 cm should be considered. Patchy infiltrates in the right lung have improved. Diffuse left lung infiltrate remains. Basilar atelectasis. No prominent pleural effusion. No pneumothorax. Stable cardiomegaly . IMPRESSION: 1. NG tube tip again noted over the lower esophagus. Advancement of approximately 20 cm should be considered. Endotracheal tube and right IJ line stable position. 2. Partial clearing of patchy infiltrates right lung. Diffuse left lung infiltrate remains. Low lung volumes. 3. Stable cardiomegaly. Critical  Value/emergent results were called by telephone at the time of interpretation on 02/23/2017 at 6:44 am to nurse Pamala Hurry , who verbally acknowledged these results. Electronically Signed   By: Marcello Moores  Register   On: 02/23/2017 06:48   Dg Chest Port 1 View  Result Date: 02/22/2017 CLINICAL DATA:  61 year old female post CPR.  Subsequent encounter. EXAM: PORTABLE CHEST 1 VIEW COMPARISON:  02/22/2017 8:52 a.m. FINDINGS: Endotracheal tube tip 3.6  cm above the carina. Endotracheal tube tip mid to distal esophagus level with side hole proximal to mid esophagus level. This needs to be reposition. Right internal jugular catheter tip mid superior vena cava level. Cardiomegaly. Patchy consolidation most notable right upper lobe, left midlung zone and right perihilar region. Question multifocal pneumonia versus asymmetric pulmonary edema. No gross pneumothorax.  Evaluation limited by overlying structures. No obvious rib fracture. IMPRESSION: Endotracheal tube tip 3.6 cm above the carina. Endotracheal tube tip mid to distal esophagus level with side hole proximal to mid esophagus level. This needs to be reposition. Right internal jugular catheter tip mid superior vena cava level. Cardiomegaly. Patchy consolidation most notable right upper lobe, left midlung zone and right perihilar region. Question multifocal pneumonia versus asymmetric pulmonary edema. No gross pneumothorax.  Evaluation limited by overlying structures. Electronically Signed   By: Genia Del M.D.   On: 02/22/2017 10:52   Dg Chest Portable 1 View  Result Date: 02/22/2017 CLINICAL DATA:  Hypoxia EXAM: PORTABLE CHEST 1 VIEW COMPARISON:  Study obtained earlier in the day. FINDINGS: Endotracheal tube tip is 3.2 cm above the carina. No pneumothorax. There is interstitial pulmonary edema with bibasilar atelectasis. No airspace consolidation. There is cardiomegaly with pulmonary venous hypertension. There is aortic atherosclerosis. No adenopathy. No bone  lesions. IMPRESSION: Endotracheal tube as described without pneumothorax. Changes of congestive heart failure. No consolidation. There is aortic atherosclerosis. Aortic Atherosclerosis (ICD10-I70.0). Electronically Signed   By: Lowella Grip III M.D.   On: 02/22/2017 09:06   Dg Chest Portable 1 View  Result Date: 02/22/2017 CLINICAL DATA:  Endotracheal tube placement EXAM: PORTABLE CHEST 1 VIEW COMPARISON:  01/27/2017 FINDINGS: Endotracheal tube in good position.  NG tip not well seen. Interval worsening of diffuse bilateral airspace disease most prominent right upper lobe. Possible edema versus pneumonia. No pleural effusion. Cardiac enlargement IMPRESSION: Endotracheal tube in good position. Significant worsening in bilateral airspace disease which may represent pneumonia or edema. Electronically Signed   By: Franchot Gallo M.D.   On: 02/22/2017 08:16   Dg Abd Portable 1v  Result Date: 02/22/2017 CLINICAL DATA:  Orogastric tube placement EXAM: PORTABLE ABDOMEN - 1 VIEW COMPARISON:  None. FINDINGS: No appreciable orogastric tube. There is mild dilatation in the transverse colon region. No small bowel dilatation. No air-fluid level. No free air evident. There are surgical clips the right upper quadrant. IMPRESSION: No orogastric tube evident. Question a degree of colonic ileus. There does not appear to be bowel obstruction or free air. Electronically Signed   By: Lowella Grip III M.D.   On: 02/22/2017 10:51     Medications:   . sodium chloride    . azithromycin Stopped (02/22/17 1418)  . ceFEPime (MAXIPIME) IV Stopped (02/23/17 0338)  . famotidine (PEPCID) IV Stopped (02/22/17 1628)  . fentaNYL infusion INTRAVENOUS 125 mcg/hr (02/23/17 0800)  . insulin (NOVOLIN-R) infusion    . norepinephrine (LEVOPHED) Adult infusion 2 mcg/min (02/23/17 0852)  . propofol (DIPRIVAN) infusion 19.963 mcg/kg/min (02/23/17 0800)  .  sodium bicarbonate (isotonic) infusion in sterile water 50 mL/hr at  02/23/17 0800   . atropine  1 mg Intravenous Once  . chlorhexidine gluconate (MEDLINE KIT)  15 mL Mouth Rinse BID  . dextrose  1 ampule Intravenous Once  . feeding supplement (PRO-STAT SUGAR FREE 64)  30 mL Per Tube BID  . feeding supplement (VITAL HIGH PROTEIN)  1,000 mL Per Tube Q24H  . mouth rinse  15 mL Mouth Rinse QID  . sodium polystyrene  15 g Oral Once   acetaminophen **OR** acetaminophen, [DISCONTINUED] ondansetron **OR** ondansetron (ZOFRAN) IV  Assessment/ Plan:  61 y.o. female with past medical history of hypothyroidism, atrial fibrillation, diabetes mellitus type 2, hypertension, obesity, GERD, osteoporosis, asthma, allergic rhinitis, chronic back pain, hyperlipidemia, B12 deficiency, and obstructive sleep apnea who was admitted with PEA arrest.    1. Acute renal failure/Chronic kidney disease stage IV/proteinuria/diabetes mellitus type 2 with chronic kidney disease/history of NSAID use.  Patient appears to be oliguric at the moment. Baseline EGFR is 29.  Currently EGFR is down to 20. Mild hyperkalemia also noted. No urgent indication for dialysis at the moment however if oliguria persists we may need to consider dialysis.   2. Hyperkalemia. Serum potassium currently 5.3. We will add veltassa 8.4 g by mouth daily to her medication regimen.  3. Acute respiratory failure/PEA arrest. The patient is maintaining on the ventilator at this point in time. Monitoring being performed by pulmonary/critical care.   LOS: 1 Cadence Minton 7/18/20189:25 AM

## 2017-02-23 NOTE — Procedures (Signed)
PROCEDURE NOTE: CVL PLACEMENT  INDICATION:    Monitoring of central venous pressures and/or administration of medications optimally administered in central vein  CONSENT:   Risks of procedure as well as the alternatives were explained to the patient or surrogate. Consent for procedure obtained. A time out was performed.   PROCEDURE  Sterile technique was used including antiseptics, cap, gloves, gown, hand hygiene, mask and full body sheet.  Skin prep: Chlorhexidine; local anesthetic administered  A triple lumen catheter was placed in the R IJ vein using the Seldinger technique.  Ultrasound was used for vessel identification and guidance.   EVALUATION:  Blood flow good  Complications: No apparent complications  Patient tolerated the procedure well.  Chest X-ray ordered to verify placement    Merton Border, MD PCCM service Mobile [phone]  02/23/2017 1:19 PM

## 2017-02-23 NOTE — Progress Notes (Addendum)
Nutrition Follow-up  DOCUMENTATION CODES:   Morbid obesity  INTERVENTION:  1. Vital High Protein @ 12mL/hr, Pro-stat 32mL QID Regimen provides: 1160 calories, 152 grams protein, 370mL free water  With propofol at 21.45mL/hr (578calories from lipids) provides 1738 calories (118% estimated needs) 152 grams protein (102% estimated needs) 358mL free water  NUTRITION DIAGNOSIS:   Inadequate oral intake related to inability to eat as evidenced by NPO status. -ongoing  GOAL:   Provide needs based on ASPEN/SCCM guidelines -progressing  MONITOR:   Vent status, Labs, Weight trends, TF tolerance, I & O's  REASON FOR ASSESSMENT:   Ventilator    ASSESSMENT:   61 year old female with PMHx of sleep apnea on CPAP at home, HTN, A-fib, GERD, DM type 2, CKD, hypothyroidism, anxiety, COPD, who had dyspnea overnight. Suffered cardiopulmonary arrest en route to hospital, underwent resuscitation and intubation in ED on 7/17. Upon arrival to ICU had another PEA arrest.  Patient is currently intubated on ventilator support MV: 11.6 L/min Temp (24hrs), Avg:98.1 F (36.7 C), Min:97.3 F (36.3 C), Max:99.1 F (37.3 C) Propofol: 21.64ml/hr --> 578 calories MAP: >/= 65 NGT tip to stomach No urgent need for dialysis at this time per nephrology Discussed in rounds PNA vs Pulm edema.   Intake/Output Summary (Last 24 hours) at 02/23/17 1238 Last data filed at 02/23/17 1150  Gross per 24 hour  Intake          1766.68 ml  Output              280 ml  Net          1486.68 ml   Labs reviewed:  CBGs: 304, 290, 290, K+ 5.3, BUN/Creatinine 56/2.50 Medications reviewed and include:  Fentanyl gtt, Levo gtt, Insulin gtt 15mg  Kayexalte for K+ elevation. NaHCO3 at 84mL/hr  Diet Order:     Skin:  Reviewed, no issues (RN skin assessment not completed at this time)  Last BM:  02/22/2017  Height:   Ht Readings from Last 1 Encounters:  02/22/17 5\' 6"  (1.676 m)    Weight:   Wt Readings from Last  1 Encounters:  02/22/17 (!) 322 lb (146.1 kg)    Ideal Body Weight:  59.1 kg  BMI:  Body mass index is 51.97 kg/m.  Estimated Nutritional Needs:   Kcal:  5625-6389 (22-25 kcal/kg IBW)  Protein:  >/= 148 grams (>/= 2.5 grams/kg IBW)  Fluid:  1.8 L/day (30 ml/kg IBW)  EDUCATION NEEDS:   No education needs identified at this time  Satira Anis. Kentrell Hallahan, MS, RD LDN Inpatient Clinical Dietitian Pager [phone]

## 2017-02-23 NOTE — Progress Notes (Signed)
Pharmacy Antibiotic Note  Andrea Rivers is a 61 y.o. female admitted on 02/22/2017 with acute respiratory failure. Patient admitted via EMS and required CPR in route to hospital. Pharmacy has been consulted for vancomycin dosing. Patient also initiated on azithromycin on 7/17.   Plan: Continue cefepime 2g IV Q12hr.   Height: 5\' 6"  (167.6 cm) Weight: (!) 322 lb (146.1 kg) IBW/kg (Calculated) : 59.3  Temp (24hrs), Avg:98.1 F (36.7 C), Min:97.3 F (36.3 C), Max:99.1 F (37.3 C)   Recent Labs Lab 02/22/17 0755 02/22/17 0800 02/22/17 1038 02/22/17 1040 02/22/17 1706 02/22/17 1859 02/23/17 0445  WBC 16.1*  --   --  34.1*  --   --  20.7*  CREATININE 2.11*  --   --  2.16* 2.32* 2.34* 2.50*  LATICACIDVEN  --  9.4* 4.3*  --   --   --   --     Estimated Creatinine Clearance: 35.1 mL/min (A) (by C-G formula based on SCr of 2.5 mg/dL (H)).    Allergies  Allergen Reactions  . Other Anaphylaxis    ILOSONE,  Caused GI distress.    Antimicrobials this admission: Zosyn 7/17 x 1 Vancomycin 7/17 >> 7/18  Cefepime 7/17 >> Azithromycin 7/17 >>  Dose adjustments this admission: N/A  Microbiology results: 7/17 BCx: no growth < 24 hours  7/17 UCx: pending  7/17 Sputum: reincubated for better growth  7/17 MRSA PCR: negative    Thank you for allowing pharmacy to be a part of this patient's care.  MLS 02/23/2017 1:44 PM

## 2017-02-23 NOTE — Progress Notes (Signed)
Patient vent mode and settings changed this am.  Informed by Dr. Alva Garnet that if minute ventilation stays at 10 or above an ABG is not needed.  RN in room and aware.

## 2017-02-24 ENCOUNTER — Inpatient Hospital Stay: Payer: Medicare Other

## 2017-02-24 LAB — URINE CULTURE: CULTURE: NO GROWTH

## 2017-02-24 LAB — COMPREHENSIVE METABOLIC PANEL
ALT: 24 U/L (ref 14–54)
ANION GAP: 10 (ref 5–15)
AST: 29 U/L (ref 15–41)
Albumin: 3 g/dL — ABNORMAL LOW (ref 3.5–5.0)
Alkaline Phosphatase: 58 U/L (ref 38–126)
BILIRUBIN TOTAL: 0.8 mg/dL (ref 0.3–1.2)
BUN: 66 mg/dL — AB (ref 6–20)
CHLORIDE: 102 mmol/L (ref 101–111)
CO2: 29 mmol/L (ref 22–32)
Calcium: 7.7 mg/dL — ABNORMAL LOW (ref 8.9–10.3)
Creatinine, Ser: 2.77 mg/dL — ABNORMAL HIGH (ref 0.44–1.00)
GFR, EST AFRICAN AMERICAN: 20 mL/min — AB (ref >60)
GFR, EST NON AFRICAN AMERICAN: 17 mL/min — AB (ref >60)
Glucose, Bld: 115 mg/dL — ABNORMAL HIGH (ref 65–99)
POTASSIUM: 4.7 mmol/L (ref 3.5–5.1)
Sodium: 141 mmol/L (ref 135–145)
TOTAL PROTEIN: 6.4 g/dL — AB (ref 6.5–8.1)

## 2017-02-24 LAB — GLUCOSE, CAPILLARY
GLUCOSE-CAPILLARY: 112 mg/dL — AB (ref 65–99)
GLUCOSE-CAPILLARY: 188 mg/dL — AB (ref 65–99)
GLUCOSE-CAPILLARY: 214 mg/dL — AB (ref 65–99)
Glucose-Capillary: 110 mg/dL — ABNORMAL HIGH (ref 65–99)
Glucose-Capillary: 110 mg/dL — ABNORMAL HIGH (ref 65–99)
Glucose-Capillary: 130 mg/dL — ABNORMAL HIGH (ref 65–99)
Glucose-Capillary: 164 mg/dL — ABNORMAL HIGH (ref 65–99)
Glucose-Capillary: 199 mg/dL — ABNORMAL HIGH (ref 65–99)

## 2017-02-24 LAB — CBC
HCT: 22.4 % — ABNORMAL LOW (ref 35.0–47.0)
HEMOGLOBIN: 6.5 g/dL — AB (ref 12.0–16.0)
MCH: 19.9 pg — ABNORMAL LOW (ref 26.0–34.0)
MCHC: 29.3 g/dL — ABNORMAL LOW (ref 32.0–36.0)
MCV: 67.9 fL — ABNORMAL LOW (ref 80.0–100.0)
PLATELETS: 228 10*3/uL (ref 150–440)
RBC: 3.29 MIL/uL — AB (ref 3.80–5.20)
RDW: 19.6 % — AB (ref 11.5–14.5)
WBC: 17.5 10*3/uL — ABNORMAL HIGH (ref 3.6–11.0)

## 2017-02-24 LAB — PROTIME-INR
INR: 7.14 — AB
PROTHROMBIN TIME: 63.6 s — AB (ref 11.4–15.2)

## 2017-02-24 LAB — HEMOGLOBIN AND HEMATOCRIT, BLOOD
HCT: 25.7 % — ABNORMAL LOW (ref 35.0–47.0)
Hemoglobin: 7.8 g/dL — ABNORMAL LOW (ref 12.0–16.0)

## 2017-02-24 LAB — PROCALCITONIN: PROCALCITONIN: 42.93 ng/mL

## 2017-02-24 LAB — PREPARE RBC (CROSSMATCH)

## 2017-02-24 LAB — ABO/RH: ABO/RH(D): O POS

## 2017-02-24 MED ORDER — INSULIN GLARGINE 100 UNIT/ML ~~LOC~~ SOLN
20.0000 [IU] | Freq: Every day | SUBCUTANEOUS | Status: DC
  Administered 2017-02-24 – 2017-02-28 (×5): 20 [IU] via SUBCUTANEOUS
  Filled 2017-02-24 (×6): qty 0.2

## 2017-02-24 MED ORDER — VITAL 1.5 CAL PO LIQD
1000.0000 mL | ORAL | Status: DC
  Administered 2017-02-24: 1000 mL

## 2017-02-24 MED ORDER — SODIUM CHLORIDE 0.9% FLUSH: 10.0000 mL | INTRAVENOUS | Status: DC | PRN

## 2017-02-24 MED ORDER — FREE WATER
200.0000 mL | Freq: Four times a day (QID) | Status: DC
  Administered 2017-02-24: 200 mL

## 2017-02-24 MED ORDER — ORAL CARE MOUTH RINSE
15.0000 mL | Freq: Two times a day (BID) | OROMUCOSAL | Status: DC
  Administered 2017-02-25 – 2017-03-01 (×8): 15 mL via OROMUCOSAL

## 2017-02-24 MED ORDER — VITAMIN K1 10 MG/ML IJ SOLN
10.0000 mg | Freq: Once | INTRAMUSCULAR | Status: AC
  Administered 2017-02-24: 10 mg via SUBCUTANEOUS
  Filled 2017-02-24: qty 1

## 2017-02-24 MED ORDER — FUROSEMIDE 10 MG/ML IJ SOLN
120.0000 mg | Freq: Once | INTRAVENOUS | Status: AC
  Administered 2017-02-24: 120 mg via INTRAVENOUS
  Filled 2017-02-24: qty 12

## 2017-02-24 MED ORDER — SODIUM CHLORIDE 0.9 % IV SOLN
Freq: Once | INTRAVENOUS | Status: AC
  Administered 2017-02-24: 07:00:00 via INTRAVENOUS

## 2017-02-24 MED ORDER — INSULIN ASPART 100 UNIT/ML ~~LOC~~ SOLN
0.0000 [IU] | SUBCUTANEOUS | Status: DC
  Administered 2017-02-24: 2 [IU] via SUBCUTANEOUS
  Administered 2017-02-24: 3 [IU] via SUBCUTANEOUS
  Administered 2017-02-24: 5 [IU] via SUBCUTANEOUS
  Administered 2017-02-24 – 2017-02-25 (×3): 3 [IU] via SUBCUTANEOUS
  Administered 2017-02-25 (×2): 5 [IU] via SUBCUTANEOUS
  Administered 2017-02-25 – 2017-02-26 (×7): 3 [IU] via SUBCUTANEOUS
  Administered 2017-02-27 (×2): 2 [IU] via SUBCUTANEOUS
  Administered 2017-02-27 (×3): 5 [IU] via SUBCUTANEOUS
  Administered 2017-02-28 (×3): 2 [IU] via SUBCUTANEOUS
  Administered 2017-02-28 (×2): 3 [IU] via SUBCUTANEOUS
  Filled 2017-02-24 (×25): qty 1

## 2017-02-24 NOTE — Progress Notes (Signed)
NP [place], has been made aware of patient's critical PT and INR lab value, and hbg level. No order at this time. Will continue to monitor patient.

## 2017-02-24 NOTE — Progress Notes (Signed)
Pt was extubated today and placed on 4L [place] this morning. Patient has tolerated it well and has been alert and oriented throughout shift. Pt has tolerated water and ice chips well, NG tube has been removed and pt has been transitioned to a soft diet. Patient's blood pressures have also improved. Levophed was turned off at 0900. Pt received 120 mg of IV Lasix and has had 5100 ML of urine output. Pt's edema has decreased significantly. Pt also received 1 unit of RBC's. Patient has not complained of any pain except for a sore throat.

## 2017-02-24 NOTE — Progress Notes (Signed)
PULMONARY / CRITICAL CARE MEDICINE   Name: Andrea Rivers MRN: [id number] DOB: 07/19/1956    ADMISSION DATE:  02/22/2017  PT PROFILE: 61 y.o. F with multiple medical problems including severe obesity awoke in the middle of the night with dyspnea. Her husband called EMS. She suffered cardiopulmonary arrest en route to the emergency department. She underwent resuscitation and ultimately intubation in the emergency department. Upon arrival to the ICU, she again suffered PEA arrest. She is on chronic warfarin for unknown reasons with admission INR of 4.33.   MAJOR EVENTS/TEST RESULTS: 07/17 admitted via ED with cardiopulmonary arrest 2, severe hypoxemia, persistent shock. 07/18 echocardiogram: 07/18 LE venous US: No evidence of DVT within either lower extremity 07/18 Reducing vasopressor requirements, reducing ventilatory support requirements, improving Uo 07/18 Echocardiogram:  07/19 Passed SBT, + F/C. Extubated and tolerating. One unit PRBCs transfused for Hgb 6.5  INDWELLING DEVICES:: ETT 07/17 >> 07/19 R IJ CVL 07/17 >>   MICRO DATA: MRSA PCR 07/17 >> NEG Strep antigen 07/17 >> NEG Urine 07/17 >> NEG Legionella antigen 07/17 >>  Resp 07/17 >>  Blood 07/17 >>   ANTIMICROBIALS:  Vancomycin 07/17 >> 07/18 Cefepime 07/17 >>  Azithromycin 07/17 >>   PCT 07/7: <0.10, 48.52, 42.93   SUBJECTIVE:  Passed SBT, and extubated under my supervision, tolerating well  VITAL SIGNS: BP 140/71   Pulse 89   Temp 98.2 F (36.8 C)   Resp 15   Ht 5\' 6"  (1.676 m)   Wt (!) 322 lb (146.1 kg)   SpO2 95%   BMI 51.97 kg/m   HEMODYNAMICS:    VENTILATOR SETTINGS: Vent Mode: PSV FiO2 (%):  [40 %] 40 % Set Rate:  [14 bmp-18 bmp] 18 bmp Vt Set:  [500 mL] 500 mL PEEP:  [5 cmH20-8 cmH20] 5 cmH20 Pressure Support:  [5 cmH20] 5 cmH20 Plateau Pressure:  [23 cmH20] 23 cmH20  INTAKE / OUTPUT: I/O last 3 completed shifts: In: 3366.8 [I.V.:2721.8; NG/GT:45; IV Piggyback:600] Out: 2694  [Urine:1655]  PHYSICAL EXAMINATION: General: RASS -1, + F/C Neuro: PERRL, EOMI, MAEs HEENT: NCAT, sclerae white Cardiovascular: NSR, no murmurs Lungs: No adventitious sounds noted anteriorly Abdomen: Extremely obese, diminished to absent bowel sounds, soft Extremities: Cool, symmetric ankle edema  LABS:  BMET  Recent Labs Lab 02/23/17 0445 02/23/17 0826 02/23/17 1730 02/24/17 0403  NA 138  --  138 141  K 5.3* 5.3* 4.8 4.7  CL 103  --  102 102  CO2 25  --  27 29  BUN 56*  --  59* 66*  CREATININE 2.50*  --  2.75* 2.77*  GLUCOSE 316*  --  116* 115*    Electrolytes  Recent Labs Lab 02/22/17 1706  02/23/17 0445 02/23/17 1730 02/24/17 0403  CALCIUM 7.7*  < > 7.5* 7.6* 7.7*  MG 2.2  --   --   --   --   < > = values in this interval not displayed.  CBC  Recent Labs Lab 02/22/17 1040 02/23/17 0445 02/24/17 0403  WBC 34.1* 20.7* 17.5*  HGB 8.3* 7.6* 6.5*  HCT 29.4* 25.8* 22.4*  PLT 377 288 228    Coag's  Recent Labs Lab 02/22/17 0755 02/23/17 0445 02/24/17 0403  APTT 49* 88*  --   INR 4.33* 5.89* 7.14*    Sepsis Markers  Recent Labs Lab 02/22/17 0755 02/22/17 0800 02/22/17 1038 02/23/17 0445 02/24/17 0403  LATICACIDVEN  --  9.4* 4.3*  --   --   PROCALCITON <0.10  --   --  48.52 42.93    ABG  Recent Labs Lab 02/22/17 1340 02/23/17 1130 02/24/17 0511  PHART 7.20* 7.31* 7.39  PCO2ART 53* 47 52*  PO2ART 51* 96 92    Liver Enzymes  Recent Labs Lab 02/22/17 0755 02/24/17 0403  AST 38 29  ALT 17 24  ALKPHOS 87 58  BILITOT 0.5 0.8  ALBUMIN 3.5 3.0*    Cardiac Enzymes  Recent Labs Lab 02/22/17 1040 02/22/17 1706 02/22/17 2243  TROPONINI 0.04* 0.08* 0.09*    Glucose  Recent Labs Lab 02/23/17 1944 02/23/17 2053 02/23/17 2159 02/23/17 2359 02/24/17 0351 02/24/17 0740  GLUCAP 104* 91 113* 110* 112* 110*    CXR: CM, mild edema pattern   ASSESSMENT / PLAN:  PULMONARY A: Acute respiratory failure with  hypoxemia Ventilator dependence, resolved - tolerating extubation Suspected pneumonia, NOS Pulmonary edema - improving P:   Monitor in ICU post extubation Supplemental O2 to maintain SPO2 > 90%   CARDIOVASCULAR A:  Cardiac arrest 2 07/17 (PEA) - etiology unclear Minimally elevated trop I - doubt acute coronary syndrome Shock, presumed septic in origin - improving. Remains on low dose NE Recent atrial fibrillation - NSR presently Relative bradycardia, resolved P:  Cont NE to maintain MAP goal > 65 mmHg F/U Echocardiogram results (performed 07/18)  RENAL A:   AKI, now non-oliguric Mild metabolic acidosis, resolved Hyperkalemia, resolved P:   Monitor BMET intermittently Monitor I/Os Correct electrolytes as indicated Discontinue HCO3 infusion 07/18 Repeat furosemide X 1 07/19  GASTROINTESTINAL A:   Severe obesity Post extubation dysphagia P:   SUP: Famotidine per tube Will leave NGT for now Continue TFs - initiated 07/18  HEMATOLOGIC A:   Anemia without evidence of acute blood loss Chronic warfarin therapy  Prolonged INR P:  DVT px: SCDs Monitor CBC intermittently Transfuse per usual guidelines Holding warfarin Vitamin K 1 dose 07/19 Monitor prothrombin time  INFECTIOUS A:   Severe sepsis Elevated PCT Suspected HCAP, NOS P:   Monitor temp, WBC count Micro and abx as above  ENDOCRINE A:   DM 2 Severe hyperglycemia, resolved P:   Transitioned from insulin infusion to SSI with Lantus  NEUROLOGIC A:   Post-anoxic encephalopathy - resolving P:   RASS goal: 0 Minimize all sedating medications   FAMILY: Husband updated in detail    CCM time:  40 mins The above time includes time spent in consultation with patient and/or family members and reviewing care plan on multidisciplinary rounds  Merton Border, MD PCCM service Mobile [phone] Pager [phone]   02/24/2017, 11:07 AM

## 2017-02-24 NOTE — Progress Notes (Signed)
Nutrition Follow-up  DOCUMENTATION CODES:   Morbid obesity  INTERVENTION:   Change to Vital 1.5 @65ml /hr- Initiate at 60ml/hr and increase by 85ml/hr every eight hours until goal rate is reached.   Free water flushes 232ml q 6 hrs  Regimen provides 2340kcal/day, 105g/day protein, 1927ml free water   RD will order supplements when diet advanced  NUTRITION DIAGNOSIS:   Inadequate oral intake related to inability to eat as evidenced by NPO status.  -ongoing  GOAL:   Patient will meet greater than or equal to 90% of their needs  -Meeting with tube feeds  MONITOR:   Diet advancement, Labs, Weight trends, TF tolerance, I & O's   ASSESSMENT:   61 year old female with PMHx of sleep apnea on CPAP at home, HTN, A-fib, GERD, DM type 2, CKD, hypothyroidism, anxiety, COPD, who had dyspnea overnight. Suffered cardiopulmonary arrest en route to hospital, underwent resuscitation and intubation in ED on 7/17. Upon arrival to ICU had another PEA arrest.   Pt extubated today. Pt with NGT and continues on tube feeds. Insulin discontinued. Urine output improved today. No new weight since admit. Continue tube feeds until pt is able to meet 75% estimated needs via oral intake.   Medications reviewed and include: pepcid, insulin, azithromycin, levophed    Labs reviewed: BUN 66(H), creat 2.77(H), Ca 7.7(L) adj. 8.5(L), alb 3.0(L) Wbc- 17.5(H), Hgb 6.5(L), Hct 22.4(L) cbgs- 316, 116, 115 x 24 hrs  Diet Order:  Diet NPO time specified Except for: Ice Chips  Skin:  Reviewed, no issues (RN skin assessment not completed at this time)  Last BM:  7/17  Height:   Ht Readings from Last 1 Encounters:  02/22/17 5\' 6"  (1.676 m)    Weight:   Wt Readings from Last 1 Encounters:  02/22/17 (!) 322 lb (146.1 kg)    Ideal Body Weight:  59.1 kg  BMI:  Body mass index is 51.97 kg/m.  Estimated Nutritional Needs:   Kcal:  2100-2400kcal/day (MSJ x 1.1-1.3)  Protein:  109-136g/day   Fluid:   >2L/day   EDUCATION NEEDS:   No education needs identified at this time  Koleen Distance MS, RD, [place] Pager #[phone] After Hours Pager: [phone]

## 2017-02-24 NOTE — Progress Notes (Signed)
Pharmacy Antibiotic Note  Andrea Rivers is a 61 y.o. female admitted on 02/22/2017 with acute respiratory failure. Patient admitted via EMS and required CPR in route to hospital. Pharmacy has been consulted for cefepime dosing. Patient also initiated on azithromycin on 7/17.   Plan: Continue cefepime 2g IV Q24hr.   Height: 5\' 6"  (167.6 cm) Weight: (!) 322 lb (146.1 kg) IBW/kg (Calculated) : 59.3  Temp (24hrs), Avg:98.5 F (36.9 C), Min:97.3 F (36.3 C), Max:99.5 F (37.5 C)   Recent Labs Lab 02/22/17 0755 02/22/17 0800 02/22/17 1038 02/22/17 1040 02/22/17 1706 02/22/17 1859 02/23/17 0445 02/23/17 1730 02/24/17 0403  WBC 16.1*  --   --  34.1*  --   --  20.7*  --  17.5*  CREATININE 2.11*  --   --  2.16* 2.32* 2.34* 2.50* 2.75* 2.77*  LATICACIDVEN  --  9.4* 4.3*  --   --   --   --   --   --     Estimated Creatinine Clearance: 31.6 mL/min (A) (by C-G formula based on SCr of 2.77 mg/dL (H)).    Allergies  Allergen Reactions  . Other Anaphylaxis    ILOSONE,  Caused GI distress.    Antimicrobials this admission: Zosyn 7/17 x 1 Vancomycin 7/17 >> 7/18  Cefepime 7/17 >> Azithromycin 7/17 >>  Dose adjustments this admission: 7/19 cefepime transitioned to q24hr dosing.   Microbiology results: 7/17 BCx: no growth x 2 days  7/17 UCx: pending  7/17 Sputum: reincubated for better growth  7/17 MRSA PCR: negative    Thank you for allowing pharmacy to be a part of this patient's care.  MLS 02/24/2017 4:25 PM

## 2017-02-24 NOTE — Progress Notes (Signed)
[place] [facility]                                                                                                                                                                                  Patient Demographics   Andrea Rivers, is a 61 y.o. female, DOB - 11/03/1955, UEA:[id number]  Admit date - 02/22/2017   Admitting Physician Dustin Flock, MD  Outpatient Primary MD for the patient is Gauger, Victoriano Lain, NP   LOS - 2  Subjective: Patient remains intubated Currently on 40% FIO2   Review of Systems:   CONSTITUTIONAL: Intubated and sedated Vitals:   Vitals:   02/24/17 1000 02/24/17 1100 02/24/17 1200 02/24/17 1300  BP: 140/71 129/75 126/60 130/69  Pulse: 84 84 84 86  Resp: '16 18 17 16  ' Temp: 98.2 F (36.8 C)     TempSrc: Oral     SpO2: 94% 92% 93% 93%  Weight:      Height:        Wt Readings from Last 3 Encounters:  02/22/17 (!) 322 lb (146.1 kg)  01/26/17 299 lb 6.2 oz (135.8 kg)     Intake/Output Summary (Last 24 hours) at 02/24/17 1339 Last data filed at 02/24/17 1200  Gross per 24 hour  Intake          2740.93 ml  Output             4075 ml  Net         -1334.07 ml    Physical Exam:   GENERAL: Critically ill-appearing, Obese HEAD, EYES, EARS, NOSE AND THROAT: Atraumatic, normocephalic.. Pupils equal and reactive to light. Sclerae anicteric. No conjunctival injection. No oro-pharyngeal erythema.  NECK: Supple. There is no jugular venous distention. No bruits, no lymphadenopathy, no thyromegaly.  HEART: Regularly irregular No murmurs, no rubs, no clicks.  LUNGS: Decreased breath sounds on the ventilator No rales or rhonchi. No wheezes.  ABDOMEN: Soft, flat, nontender, nondistended. Has good bowel sounds. No hepatosplenomegaly appreciated.  EXTREMITIES: No evidence of any cyanosis, clubbing, or peripheral edema.  +2 pedal and radial pulses bilaterally.  NEUROLOGIC: Sedated SKIN: Moist and warm with no rashes  appreciated.  Psych: Sedated  LN: No inguinal LN enlargement    Antibiotics   Anti-infectives    Start     Dose/Rate Route Frequency Ordered Stop   02/22/17 1500  ceFEPIme (MAXIPIME) 2 g in dextrose 5 % 50 mL IVPB  Status:  Discontinued     2 g 100 mL/hr over 30 Minutes Intravenous Every 12 hours 02/22/17 1127 02/24/17 1111   02/22/17 1400  vancomycin (VANCOCIN) 1,250 mg in sodium chloride 0.9 % 250  mL IVPB  Status:  Discontinued     1,250 mg 166.7 mL/hr over 90 Minutes Intravenous Every 18 hours 02/22/17 0842 02/23/17 0839   02/22/17 1200  azithromycin (ZITHROMAX) 500 mg in dextrose 5 % 250 mL IVPB     500 mg 250 mL/hr over 60 Minutes Intravenous Every 24 hours 02/22/17 1113     02/22/17 0845  piperacillin-tazobactam (ZOSYN) IVPB 4.5 g  Status:  Discontinued     4.5 g 200 mL/hr over 30 Minutes Intravenous Every 8 hours 02/22/17 0842 02/22/17 1113   02/22/17 0830  vancomycin (VANCOCIN) IVPB 1000 mg/200 mL premix     1,000 mg 200 mL/hr over 60 Minutes Intravenous  Once 02/22/17 0823 02/22/17 1030   02/22/17 0815  vancomycin (VANCOCIN) injection 2,000 mg  Status:  Discontinued     2,000 mg Intravenous  Once 02/22/17 0804 02/22/17 0823   02/22/17 0815  piperacillin-tazobactam (ZOSYN) IVPB 3.375 g     3.375 g 100 mL/hr over 30 Minutes Intravenous  Once 02/22/17 0804 02/22/17 0925      Medications   Scheduled Meds: . chlorhexidine gluconate (MEDLINE KIT)  15 mL Mouth Rinse BID  . famotidine  20 mg Per Tube QHS  . free water  200 mL Per Tube Q6H  . insulin aspart  0-15 Units Subcutaneous Q4H  . insulin glargine  20 Units Subcutaneous QHS  . mouth rinse  15 mL Mouth Rinse QID   Continuous Infusions: . azithromycin 500 mg (02/24/17 1200)  . feeding supplement (VITAL 1.5 CAL) 1,000 mL (02/24/17 1200)  . norepinephrine (LEVOPHED) Adult infusion Stopped (02/24/17 0904)   PRN Meds:.acetaminophen **OR** [DISCONTINUED] acetaminophen, [DISCONTINUED] ondansetron **OR** ondansetron  (ZOFRAN) IV, sodium chloride flush   Data Review:   Micro Results Recent Results (from the past 240 hour(s))  Blood Culture (routine x 2)     Status: None (Preliminary result)   Collection Time: 02/22/17  7:55 AM  Result Value Ref Range Status   Specimen Description BLOOD RIGHT AV  Final   Special Requests   Final    BOTTLES DRAWN AEROBIC AND ANAEROBIC Blood Culture results may not be optimal due to an excessive volume of blood received in culture bottles   Culture NO GROWTH 2 DAYS  Final   Report Status PENDING  Incomplete  Blood Culture (routine x 2)     Status: None (Preliminary result)   Collection Time: 02/22/17  8:15 AM  Result Value Ref Range Status   Specimen Description BLOOD RIGHT HAND  Final   Special Requests   Final    BOTTLES DRAWN AEROBIC AND ANAEROBIC Blood Culture results may not be optimal due to an excessive volume of blood received in culture bottles   Culture NO GROWTH 2 DAYS  Final   Report Status PENDING  Incomplete  Culture, respiratory (NON-Expectorated)     Status: None (Preliminary result)   Collection Time: 02/22/17 10:00 AM  Result Value Ref Range Status   Specimen Description TRACHEAL ASPIRATE  Final   Special Requests Normal  Final   Gram Stain   Final    FEW WBC PRESENT,BOTH PMN AND MONONUCLEAR NO ORGANISMS SEEN    Culture   Final    Consistent with normal respiratory flora. Performed at [facility], [place] [address] [place], [place] [address]    Report Status PENDING  Incomplete  MRSA PCR Screening     Status: None   Collection Time: 02/22/17 10:50 AM  Result Value Ref Range Status  MRSA by PCR NEGATIVE NEGATIVE Final    Comment:        The GeneXpert MRSA Assay (FDA approved for NASAL specimens only), is one component of a comprehensive MRSA colonization surveillance program. It is not intended to diagnose MRSA infection nor to guide or monitor treatment for MRSA infections.   Urine culture     Status: None   Collection  Time: 02/22/17 11:26 AM  Result Value Ref Range Status   Specimen Description URINE, CATHETERIZED  Final   Special Requests NONE  Final   Culture   Final    NO GROWTH Performed at [facility], [address] [place], [place] [address]    Report Status 02/24/2017 FINAL  Final    Radiology Reports Dg Chest 1 View  Result Date: 01/27/2017 CLINICAL DATA:  Initial evaluation for acute hypoxia. EXAM: CHEST 1 VIEW COMPARISON:  Prior radiograph from 01/26/2017. FINDINGS: Stable cardiomegaly.  Mediastinal silhouette within normal limits. Lungs normally inflated. Diffuse pulmonary vascular congestion with interstitial edema, improved from previous. No definite pleural effusion. Slight asymmetric opacity within the left lung may reflect edema and/ or infiltrate No pneumothorax. Osseous structures unchanged. IMPRESSION: 1. Stable cardiomegaly with persistent but improved pulmonary edema as compared 01/26/2017. 2. Persistent slight asymmetric opacity within the left lung as compared to the right. While this may be related to edema, underlying infiltrate could be considered in the correct clinical setting. Electronically Signed   By: Jeannine Boga M.D.   On: 01/27/2017 05:50   Dg Chest 2 View  Result Date: 01/27/2017 CLINICAL DATA:  Shortness of breath and pulmonary edema EXAM: CHEST  2 VIEW COMPARISON:  01/27/2017 and prior exams FINDINGS: Cardiomegaly with pulmonary edema is unchanged. No definite pleural effusions noted. There is no evidence of pneumothorax or acute bony abnormality. IMPRESSION: No significant change in cardiomegaly and pulmonary edema. Electronically Signed   By: Margarette Canada M.D.   On: 01/27/2017 10:38   Dg Abd 1 View  Result Date: 02/24/2017 CLINICAL DATA:  NG tube placement EXAM: ABDOMEN - 1 VIEW COMPARISON:  02/23/2017 FINDINGS: The tip and side port of a gastric tube are noted in the right upper quadrant presumably in the distal stomach. The patient is slightly rotated  on current exam. Cardiomegaly is noted with central line catheter tip projecting over the cavoatrial junction. IMPRESSION: 1. Gastric tube in the right upper quadrant suggesting a distal gastric position of the gastric tube tip. 2. Cardiomegaly with central line catheter in the expected location of the cavoatrial junction. Electronically Signed   By: Ashley Royalty M.D.   On: 02/24/2017 01:59   US Venous Img Lower Bilateral  Result Date: 02/23/2017 CLINICAL DATA:  Bilateral lower extremity edema. EXAM: BILATERAL LOWER EXTREMITY VENOUS DOPPLER ULTRASOUND TECHNIQUE: Gray-scale sonography with graded compression, as well as color Doppler and duplex ultrasound were performed to evaluate the lower extremity deep venous systems from the level of the common femoral vein and including the common femoral, femoral, profunda femoral, popliteal and calf veins including the posterior tibial, peroneal and gastrocnemius veins when visible. The superficial great saphenous vein was also interrogated. Spectral Doppler was utilized to evaluate flow at rest and with distal augmentation maneuvers in the common femoral, femoral and popliteal veins. COMPARISON:  Ultrasound 06/13/2007. FINDINGS: RIGHT LOWER EXTREMITY Common Femoral Vein: No evidence of thrombus. Normal compressibility, respiratory phasicity and response to augmentation. Saphenofemoral Junction: No evidence of thrombus. Normal compressibility and flow on color Doppler imaging. Profunda Femoral Vein: No evidence  of thrombus. Normal compressibility and flow on color Doppler imaging. Femoral Vein: No evidence of thrombus. Normal compressibility, respiratory phasicity and response to augmentation. Popliteal Vein: No evidence of thrombus. Normal compressibility, respiratory phasicity and response to augmentation. Calf Veins: No evidence of thrombus. Normal compressibility and flow on color Doppler imaging. Limited visualization due to edema and patient clinical condition .  Superficial Great Saphenous Vein: No evidence of thrombus. Normal compressibility and flow on color Doppler imaging. Other Findings:  None. LEFT LOWER EXTREMITY Common Femoral Vein: No evidence of thrombus. Normal compressibility, respiratory phasicity and response to augmentation. Saphenofemoral Junction: No evidence of thrombus. Normal compressibility and flow on color Doppler imaging. Profunda Femoral Vein: No evidence of thrombus. Normal compressibility and flow on color Doppler imaging. Femoral Vein: No evidence of thrombus. Normal compressibility, respiratory phasicity and response to augmentation. Popliteal Vein: No evidence of thrombus. Normal compressibility, respiratory phasicity and response to augmentation. Calf Veins: No evidence of thrombus. Normal compressibility and flow on color Doppler imaging. Limited visualization due to edema and patient clinical condition . Superficial Great Saphenous Vein: No evidence of thrombus. Normal compressibility and flow on color Doppler imaging. Other Findings:  None. IMPRESSION: No evidence of DVT within either lower extremity. Electronically Signed   By: Marcello Moores  Register   On: 02/23/2017 12:51   Dg Chest Port 1 View  Result Date: 02/24/2017 CLINICAL DATA:  Respiratory failure. EXAM: PORTABLE CHEST 1 VIEW COMPARISON:  02/23/2017. FINDINGS: Endotracheal tube, NG tube, right IJ line in stable position. Cardiomegaly with bilateral pulmonary infiltrates/edema and small bilateral pleural effusions. Findings most consistent with congestive heart failure. Slight worsening from prior exam . IMPRESSION: 1. Lines and tubes in stable position. 2. Congestive heart failure pulmonary edema. Slight worsening of pulmonary edema from prior exams. Small bilateral pleural effusions again noted. Electronically Signed   By: Marcello Moores  Register   On: 02/24/2017 07:39   Dg Chest Port 1 View  Result Date: 02/23/2017 CLINICAL DATA:  Acute respiratory failure. EXAM: PORTABLE CHEST 1 VIEW  COMPARISON:  02/23/2017 chest radiograph. FINDINGS: Right rotated chest radiograph. Endotracheal tube tip is 2.1 cm above the carina. Right internal jugular central venous catheter terminates at the cavoatrial junction. Enteric tube enters the lower thoracic esophagus, with the tip not seen on this image. Stable cardiomediastinal silhouette with mild cardiomegaly. No pneumothorax. Small bilateral pleural effusions. Mild-to-moderate pulmonary edema. Bibasilar atelectasis. IMPRESSION: 1. Well-positioned endotracheal tube. Enteric tube enters the lower thoracic esophagus with the tip not seen on this image. 2. Mild-to-moderate congestive heart failure. 3. Small bilateral pleural effusions with bibasilar atelectasis. Electronically Signed   By: Ilona Sorrel M.D.   On: 02/23/2017 19:56   Dg Chest Port 1 View  Result Date: 02/23/2017 CLINICAL DATA:  Shortness of breath. EXAM: PORTABLE CHEST 1 VIEW COMPARISON:  02/22/2017. FINDINGS: Surgical clips right chest. Endotracheal tube and right IJ line stable position. NG tube tip remains projected over the lower esophagus, advancement of approximately 20 cm should be considered. Patchy infiltrates in the right lung have improved. Diffuse left lung infiltrate remains. Basilar atelectasis. No prominent pleural effusion. No pneumothorax. Stable cardiomegaly . IMPRESSION: 1. NG tube tip again noted over the lower esophagus. Advancement of approximately 20 cm should be considered. Endotracheal tube and right IJ line stable position. 2. Partial clearing of patchy infiltrates right lung. Diffuse left lung infiltrate remains. Low lung volumes. 3. Stable cardiomegaly. Critical Value/emergent results were called by telephone at the time of interpretation on 02/23/2017 at 6:44 am to nurse Pamala Hurry ,  who verbally acknowledged these results. Electronically Signed   By: Marcello Moores  Register   On: 02/23/2017 06:48   Dg Chest Port 1 View  Result Date: 02/22/2017 CLINICAL DATA:  61 year old  female post CPR.  Subsequent encounter. EXAM: PORTABLE CHEST 1 VIEW COMPARISON:  02/22/2017 8:52 a.m. FINDINGS: Endotracheal tube tip 3.6 cm above the carina. Endotracheal tube tip mid to distal esophagus level with side hole proximal to mid esophagus level. This needs to be reposition. Right internal jugular catheter tip mid superior vena cava level. Cardiomegaly. Patchy consolidation most notable right upper lobe, left midlung zone and right perihilar region. Question multifocal pneumonia versus asymmetric pulmonary edema. No gross pneumothorax.  Evaluation limited by overlying structures. No obvious rib fracture. IMPRESSION: Endotracheal tube tip 3.6 cm above the carina. Endotracheal tube tip mid to distal esophagus level with side hole proximal to mid esophagus level. This needs to be reposition. Right internal jugular catheter tip mid superior vena cava level. Cardiomegaly. Patchy consolidation most notable right upper lobe, left midlung zone and right perihilar region. Question multifocal pneumonia versus asymmetric pulmonary edema. No gross pneumothorax.  Evaluation limited by overlying structures. Electronically Signed   By: Genia Del M.D.   On: 02/22/2017 10:52   Dg Chest Portable 1 View  Result Date: 02/22/2017 CLINICAL DATA:  Hypoxia EXAM: PORTABLE CHEST 1 VIEW COMPARISON:  Study obtained earlier in the day. FINDINGS: Endotracheal tube tip is 3.2 cm above the carina. No pneumothorax. There is interstitial pulmonary edema with bibasilar atelectasis. No airspace consolidation. There is cardiomegaly with pulmonary venous hypertension. There is aortic atherosclerosis. No adenopathy. No bone lesions. IMPRESSION: Endotracheal tube as described without pneumothorax. Changes of congestive heart failure. No consolidation. There is aortic atherosclerosis. Aortic Atherosclerosis (ICD10-I70.0). Electronically Signed   By: Lowella Grip III M.D.   On: 02/22/2017 09:06   Dg Chest Portable 1 View  Result  Date: 02/22/2017 CLINICAL DATA:  Endotracheal tube placement EXAM: PORTABLE CHEST 1 VIEW COMPARISON:  01/27/2017 FINDINGS: Endotracheal tube in good position.  NG tip not well seen. Interval worsening of diffuse bilateral airspace disease most prominent right upper lobe. Possible edema versus pneumonia. No pleural effusion. Cardiac enlargement IMPRESSION: Endotracheal tube in good position. Significant worsening in bilateral airspace disease which may represent pneumonia or edema. Electronically Signed   By: Franchot Gallo M.D.   On: 02/22/2017 08:16   Dg Chest Port 1 View  Result Date: 01/26/2017 CLINICAL DATA:  Shortness breath.  History of diabetes and edema. EXAM: PORTABLE CHEST 1 VIEW COMPARISON:  Chest radiograph January 08, 2016 FINDINGS: Cardiac silhouette is mildly enlarged and unchanged. Calcified aortic knob. Interstitial and predominately LEFT lung alveolar airspace opacities. No pleural effusion. No pneumothorax. Bronchitic changes. Soft tissue planes and included osseous structures are nonsuspicious. Large body habitus. IMPRESSION: Interstitial and alveolar airspace opacities can be seen with pulmonary edema or infection. Stable cardiomegaly. Electronically Signed   By: [place]on Alas M.D.   On: 01/26/2017 13:34   Dg Abd Portable 1v  Result Date: 02/23/2017 CLINICAL DATA:  Nasogastric tube placement EXAM: PORTABLE ABDOMEN - 1 VIEW COMPARISON:  Yesterday FINDINGS: Nasogastric tube tip overlaps the distal stomach. Central line with tip at the upper cavoatrial junction. Chest x-ray was obtained earlier today. Stable bowel gas pattern with mild gaseous distention of right colon and distal stomach. Cholecystectomy clips. Artifact from defibrillator pads. IMPRESSION: Nasogastric tube tip overlaps the  distal stomach. Electronically Signed   By: Monte Fantasia M.D.   On: 02/23/2017 09:25   Dg  Abd Portable 1v  Result Date: 02/22/2017 CLINICAL DATA:  Orogastric tube placement EXAM: PORTABLE ABDOMEN  - 1 VIEW COMPARISON:  None. FINDINGS: No appreciable orogastric tube. There is mild dilatation in the transverse colon region. No small bowel dilatation. No air-fluid level. No free air evident. There are surgical clips the right upper quadrant. IMPRESSION: No orogastric tube evident. Question a degree of colonic ileus. There does not appear to be bowel obstruction or free air. Electronically Signed   By: Lowella Grip III M.D.   On: 02/22/2017 10:51     CBC  Recent Labs Lab 02/22/17 0755 02/22/17 1040 02/23/17 0445 02/24/17 0403  WBC 16.1* 34.1* 20.7* 17.5*  HGB 8.6* 8.3* 7.6* 6.5*  HCT 30.2* 29.4* 25.8* 22.4*  PLT 373 377 288 228  MCV 72.9* 71.5* 70.0* 67.9*  MCH 20.8* 20.2* 20.7* 19.9*  MCHC 28.5* 28.2* 29.7* 29.3*  RDW 20.7* 20.2* 20.2* 19.6*  LYMPHSABS 2.7  --   --   --   MONOABS 1.4*  --   --   --   EOSABS 0.2  --   --   --   BASOSABS 0.2*  --   --   --     Chemistries   Recent Labs Lab 02/22/17 0755  02/22/17 1706 02/22/17 1859  02/23/17 0059 02/23/17 0445 02/23/17 0826 02/23/17 1730 02/24/17 0403  NA 139  < > 137 135  --   --  138  --  138 141  K 4.3  < > 5.9* 5.8*  < > 5.6* 5.3* 5.3* 4.8 4.7  CL 103  < > 105 102  --   --  103  --  102 102  CO2 20*  < > 23 22  --   --  25  --  27 29  GLUCOSE 356*  < > 422* 390*  --   --  316*  --  116* 115*  BUN 37*  < > 49* 51*  --   --  56*  --  59* 66*  CREATININE 2.11*  < > 2.32* 2.34*  --   --  2.50*  --  2.75* 2.77*  CALCIUM 8.6*  < > 7.7* 7.8*  --   --  7.5*  --  7.6* 7.7*  MG  --   --  2.2  --   --   --   --   --   --   --   AST 38  --   --   --   --   --   --   --   --  29  ALT 17  --   --   --   --   --   --   --   --  24  ALKPHOS 87  --   --   --   --   --   --   --   --  58  BILITOT 0.5  --   --   --   --   --   --   --   --  0.8  < > = values in this interval not displayed. ------------------------------------------------------------------------------------------------------------------ estimated creatinine  clearance is 31.6 mL/min (A) (by C-G formula based on SCr of 2.77 mg/dL (H)). ------------------------------------------------------------------------------------------------------------------ No results for input(s): HGBA1C in the last 72 hours. ------------------------------------------------------------------------------------------------------------------  Recent Labs  02/22/17 1706  TRIG 68   ------------------------------------------------------------------------------------------------------------------  Recent Labs  02/22/17 1040  TSH 0.191*   ------------------------------------------------------------------------------------------------------------------ No results for input(s):  VITAMINB12, FOLATE, FERRITIN, TIBC, IRON, RETICCTPCT in the last 72 hours.  Coagulation profile  Recent Labs Lab 02/22/17 0755 02/23/17 0445 02/24/17 0403  INR 4.33* 5.89* 7.14*    No results for input(s): DDIMER in the last 72 hours.  Cardiac Enzymes  Recent Labs Lab 02/22/17 1040 02/22/17 1706 02/22/17 2243  TROPONINI 0.04* 0.08* 0.09*   ------------------------------------------------------------------------------------------------------------------ Invalid input(s): POCBNP    Assessment & Plan   IMPRESSION AND PLAN: Patient is a 61 year old status post respiratory arrest  1. Acute respiratory failure Due to pneumonia- continue IV cefepime Differential diagnosis includes possible ARDS or CHF Continue ventilator support as per pulmonary  2. Status post cardiac arrest Suspect due to her underlying lung issues  3. Diabetes type 2 continue sliding scale insulin,   4. History of hypertensionnow hypotensive report of care  5. Hypothyroidism  TSH is suppressed Likely due to euthyroid sick syndrome  6. Morbid obesity  7.Acute renal failure on chronic kidney disease nephrology consult appreciated dictation showing output is improved       Code Status Orders         Start     Ordered   02/22/17 1004  Full code  Continuous     02/22/17 1003    Code Status History    Date Active Date Inactive Code Status Order ID Comments User Context   01/26/2017  4:36 PM 01/28/2017 12:34 PM Full Code [id number]  Baxter Hire, MD Inpatient           Consults Pulmonary critical care, nephrology  DVT Prophylaxis SCDs  Lab Results  Component Value Date   PLT 228 02/24/2017     Time Spent in minutes 64mn Greater than 50% of time spent in care coordination and counseling patient regarding the condition and plan of care.   PDustin FlockM.D on 02/24/2017 at 1:39 PM  Between 7am to 6pm - Pager - [phone]  After 6pm go to [email or web address] - password [email or web address]   Office  [phone]

## 2017-02-24 NOTE — Progress Notes (Signed)
Called to the room by the nurse.  Patient minute ventilation alarm sounding.  Minute volume ar 4.0.  Patient not tolerating PCV, not getting any volume at this time.  Had Andrea Stalker NP look at patient as well.  Patient mode changed to [facility] and patient tolerating much better.  ETCO2 returned to 51 down from 60 and patient much more comfortable.  Please see flow sheet for further information.

## 2017-02-24 NOTE — Progress Notes (Signed)
[facility]  ROUNDING NOTE   Subjective:  Patient remains on the ventilator at this point in time. Creatinine about the same at 2.7. However urine output has increased to 1.5 L over the preceding 24 hours. Family at bedside.   Objective:  Vital signs in last 24 hours:  Temp:  [97.3 F (36.3 C)-99.5 F (37.5 C)] 98.2 F (36.8 C) (07/19 0945) Pulse Rate:  [56-91] 89 (07/19 0945) Resp:  [8-26] 15 (07/19 0945) BP: (88-147)/(45-79) 143/77 (07/19 0945) SpO2:  [90 %-99 %] 95 % (07/19 0945) FiO2 (%):  [40 %] 40 % (07/19 0800)  Weight change:  Filed Weights   02/22/17 0813  Weight: (!) 146.1 kg (322 lb)    Intake/Output: I/O last 3 completed shifts: In: 3366.8 [I.V.:2721.8; NG/GT:45; IV Piggyback:600] Out: 0272 [Urine:1655]   Intake/Output this shift:  Total I/O In: 528.7 [I.V.:158.7; Blood:370] Out: 350 [Urine:350]  Physical Exam: General: Critically ill appearing   Head: NG in place, ETT in place   Eyes: Anicteric  Neck: Supple, trachea midline  Lungs:  Bilateral rhonchi, breath sounds vent assisted   Heart: S1S2 no rubs  Abdomen:  Soft, nontender, bowel sounds present  Extremities: 1+ peripheral edema.  Neurologic: Intubated, But arousable and will follow simple commands   Skin: No lesions       Basic Metabolic Panel:  Recent Labs Lab 02/22/17 1706 02/22/17 1859  02/23/17 0059 02/23/17 0445 02/23/17 0826 02/23/17 1730 02/24/17 0403  NA 137 135  --   --  138  --  138 141  K 5.9* 5.8*  < > 5.6* 5.3* 5.3* 4.8 4.7  CL 105 102  --   --  103  --  102 102  CO2 23 22  --   --  25  --  27 29  GLUCOSE 422* 390*  --   --  316*  --  116* 115*  BUN 49* 51*  --   --  56*  --  59* 66*  CREATININE 2.32* 2.34*  --   --  2.50*  --  2.75* 2.77*  CALCIUM 7.7* 7.8*  --   --  7.5*  --  7.6* 7.7*  MG 2.2  --   --   --   --   --   --   --   < > = values in this interval not displayed.  Liver Function Tests:  Recent Labs Lab 02/22/17 0755 02/24/17 0403   AST 38 29  ALT 17 24  ALKPHOS 87 58  BILITOT 0.5 0.8  PROT 7.8 6.4*  ALBUMIN 3.5 3.0*   No results for input(s): LIPASE, AMYLASE in the last 168 hours. No results for input(s): AMMONIA in the last 168 hours.  CBC:  Recent Labs Lab 02/22/17 0755 02/22/17 1040 02/23/17 0445 02/24/17 0403  WBC 16.1* 34.1* 20.7* 17.5*  NEUTROABS 11.6*  --   --   --   HGB 8.6* 8.3* 7.6* 6.5*  HCT 30.2* 29.4* 25.8* 22.4*  MCV 72.9* 71.5* 70.0* 67.9*  PLT 373 377 288 228    Cardiac Enzymes:  Recent Labs Lab 02/22/17 0755 02/22/17 1040 02/22/17 1706 02/22/17 2243  TROPONINI <0.03 0.04* 0.08* 0.09*    BNP: Invalid input(s): POCBNP  CBG:  Recent Labs Lab 02/23/17 2053 02/23/17 2159 02/23/17 2359 02/24/17 0351 02/24/17 0740  GLUCAP 91 113* 110* 112* 110*    Microbiology: Results for orders placed or performed during the hospital encounter of 02/22/17  Blood Culture (routine x 2)  Status: None (Preliminary result)   Collection Time: 02/22/17  7:55 AM  Result Value Ref Range Status   Specimen Description BLOOD RIGHT AV  Final   Special Requests   Final    BOTTLES DRAWN AEROBIC AND ANAEROBIC Blood Culture results may not be optimal due to an excessive volume of blood received in culture bottles   Culture NO GROWTH 2 DAYS  Final   Report Status PENDING  Incomplete  Blood Culture (routine x 2)     Status: None (Preliminary result)   Collection Time: 02/22/17  8:15 AM  Result Value Ref Range Status   Specimen Description BLOOD RIGHT HAND  Final   Special Requests   Final    BOTTLES DRAWN AEROBIC AND ANAEROBIC Blood Culture results may not be optimal due to an excessive volume of blood received in culture bottles   Culture NO GROWTH 2 DAYS  Final   Report Status PENDING  Incomplete  Culture, respiratory (NON-Expectorated)     Status: None (Preliminary result)   Collection Time: 02/22/17 10:00 AM  Result Value Ref Range Status   Specimen Description TRACHEAL ASPIRATE  Final    Special Requests Normal  Final   Gram Stain   Final    FEW WBC PRESENT,BOTH PMN AND MONONUCLEAR NO ORGANISMS SEEN    Culture   Final    CULTURE REINCUBATED FOR BETTER GROWTH Performed at [facility], [address] [place], [place] [address]    Report Status PENDING  Incomplete  MRSA PCR Screening     Status: None   Collection Time: 02/22/17 10:50 AM  Result Value Ref Range Status   MRSA by PCR NEGATIVE NEGATIVE Final    Comment:        The GeneXpert MRSA Assay (FDA approved for NASAL specimens only), is one component of a comprehensive MRSA colonization surveillance program. It is not intended to diagnose MRSA infection nor to guide or monitor treatment for MRSA infections.     Coagulation Studies:  Recent Labs  02/22/17 0755 02/23/17 0445 02/24/17 0403  LABPROT 42.6* 54.5* 63.6*  INR 4.33* 5.89* 7.14*    Urinalysis:  Recent Labs  02/22/17 1126  COLORURINE YELLOW*  LABSPEC 1.011  PHURINE 5.0  GLUCOSEU >=500*  HGBUR NEGATIVE  BILIRUBINUR NEGATIVE  KETONESUR NEGATIVE  PROTEINUR 100*  NITRITE NEGATIVE  LEUKOCYTESUR NEGATIVE      Imaging: Dg Abd 1 View  Result Date: 02/24/2017 CLINICAL DATA:  NG tube placement EXAM: ABDOMEN - 1 VIEW COMPARISON:  02/23/2017 FINDINGS: The tip and side port of a gastric tube are noted in the right upper quadrant presumably in the distal stomach. The patient is slightly rotated on current exam. Cardiomegaly is noted with central line catheter tip projecting over the cavoatrial junction. IMPRESSION: 1. Gastric tube in the right upper quadrant suggesting a distal gastric position of the gastric tube tip. 2. Cardiomegaly with central line catheter in the expected location of the cavoatrial junction. Electronically Signed   By: Ashley Royalty M.D.   On: 02/24/2017 01:59   US Venous Img Lower Bilateral  Result Date: 02/23/2017 CLINICAL DATA:  Bilateral lower extremity edema. EXAM: BILATERAL LOWER EXTREMITY VENOUS DOPPLER  ULTRASOUND TECHNIQUE: Gray-scale sonography with graded compression, as well as color Doppler and duplex ultrasound were performed to evaluate the lower extremity deep venous systems from the level of the common femoral vein and including the common femoral, femoral, profunda femoral, popliteal and calf veins including the posterior tibial, peroneal and gastrocnemius veins when  visible. The superficial great saphenous vein was also interrogated. Spectral Doppler was utilized to evaluate flow at rest and with distal augmentation maneuvers in the common femoral, femoral and popliteal veins. COMPARISON:  Ultrasound 06/13/2007. FINDINGS: RIGHT LOWER EXTREMITY Common Femoral Vein: No evidence of thrombus. Normal compressibility, respiratory phasicity and response to augmentation. Saphenofemoral Junction: No evidence of thrombus. Normal compressibility and flow on color Doppler imaging. Profunda Femoral Vein: No evidence of thrombus. Normal compressibility and flow on color Doppler imaging. Femoral Vein: No evidence of thrombus. Normal compressibility, respiratory phasicity and response to augmentation. Popliteal Vein: No evidence of thrombus. Normal compressibility, respiratory phasicity and response to augmentation. Calf Veins: No evidence of thrombus. Normal compressibility and flow on color Doppler imaging. Limited visualization due to edema and patient clinical condition . Superficial Great Saphenous Vein: No evidence of thrombus. Normal compressibility and flow on color Doppler imaging. Other Findings:  None. LEFT LOWER EXTREMITY Common Femoral Vein: No evidence of thrombus. Normal compressibility, respiratory phasicity and response to augmentation. Saphenofemoral Junction: No evidence of thrombus. Normal compressibility and flow on color Doppler imaging. Profunda Femoral Vein: No evidence of thrombus. Normal compressibility and flow on color Doppler imaging. Femoral Vein: No evidence of thrombus. Normal  compressibility, respiratory phasicity and response to augmentation. Popliteal Vein: No evidence of thrombus. Normal compressibility, respiratory phasicity and response to augmentation. Calf Veins: No evidence of thrombus. Normal compressibility and flow on color Doppler imaging. Limited visualization due to edema and patient clinical condition . Superficial Great Saphenous Vein: No evidence of thrombus. Normal compressibility and flow on color Doppler imaging. Other Findings:  None. IMPRESSION: No evidence of DVT within either lower extremity. Electronically Signed   By: Marcello Moores  Register   On: 02/23/2017 12:51   Dg Chest Port 1 View  Result Date: 02/24/2017 CLINICAL DATA:  Respiratory failure. EXAM: PORTABLE CHEST 1 VIEW COMPARISON:  02/23/2017. FINDINGS: Endotracheal tube, NG tube, right IJ line in stable position. Cardiomegaly with bilateral pulmonary infiltrates/edema and small bilateral pleural effusions. Findings most consistent with congestive heart failure. Slight worsening from prior exam . IMPRESSION: 1. Lines and tubes in stable position. 2. Congestive heart failure pulmonary edema. Slight worsening of pulmonary edema from prior exams. Small bilateral pleural effusions again noted. Electronically Signed   By: Marcello Moores  Register   On: 02/24/2017 07:39   Dg Chest Port 1 View  Result Date: 02/23/2017 CLINICAL DATA:  Acute respiratory failure. EXAM: PORTABLE CHEST 1 VIEW COMPARISON:  02/23/2017 chest radiograph. FINDINGS: Right rotated chest radiograph. Endotracheal tube tip is 2.1 cm above the carina. Right internal jugular central venous catheter terminates at the cavoatrial junction. Enteric tube enters the lower thoracic esophagus, with the tip not seen on this image. Stable cardiomediastinal silhouette with mild cardiomegaly. No pneumothorax. Small bilateral pleural effusions. Mild-to-moderate pulmonary edema. Bibasilar atelectasis. IMPRESSION: 1. Well-positioned endotracheal tube. Enteric tube  enters the lower thoracic esophagus with the tip not seen on this image. 2. Mild-to-moderate congestive heart failure. 3. Small bilateral pleural effusions with bibasilar atelectasis. Electronically Signed   By: Ilona Sorrel M.D.   On: 02/23/2017 19:56   Dg Chest Port 1 View  Result Date: 02/23/2017 CLINICAL DATA:  Shortness of breath. EXAM: PORTABLE CHEST 1 VIEW COMPARISON:  02/22/2017. FINDINGS: Surgical clips right chest. Endotracheal tube and right IJ line stable position. NG tube tip remains projected over the lower esophagus, advancement of approximately 20 cm should be considered. Patchy infiltrates in the right lung have improved. Diffuse left lung infiltrate remains. Basilar atelectasis. No prominent  pleural effusion. No pneumothorax. Stable cardiomegaly . IMPRESSION: 1. NG tube tip again noted over the lower esophagus. Advancement of approximately 20 cm should be considered. Endotracheal tube and right IJ line stable position. 2. Partial clearing of patchy infiltrates right lung. Diffuse left lung infiltrate remains. Low lung volumes. 3. Stable cardiomegaly. Critical Value/emergent results were called by telephone at the time of interpretation on 02/23/2017 at 6:44 am to nurse Pamala Hurry , who verbally acknowledged these results. Electronically Signed   By: Marcello Moores  Register   On: 02/23/2017 06:48   Dg Chest Port 1 View  Result Date: 02/22/2017 CLINICAL DATA:  61 year old female post CPR.  Subsequent encounter. EXAM: PORTABLE CHEST 1 VIEW COMPARISON:  02/22/2017 8:52 a.m. FINDINGS: Endotracheal tube tip 3.6 cm above the carina. Endotracheal tube tip mid to distal esophagus level with side hole proximal to mid esophagus level. This needs to be reposition. Right internal jugular catheter tip mid superior vena cava level. Cardiomegaly. Patchy consolidation most notable right upper lobe, left midlung zone and right perihilar region. Question multifocal pneumonia versus asymmetric pulmonary edema. No gross  pneumothorax.  Evaluation limited by overlying structures. No obvious rib fracture. IMPRESSION: Endotracheal tube tip 3.6 cm above the carina. Endotracheal tube tip mid to distal esophagus level with side hole proximal to mid esophagus level. This needs to be reposition. Right internal jugular catheter tip mid superior vena cava level. Cardiomegaly. Patchy consolidation most notable right upper lobe, left midlung zone and right perihilar region. Question multifocal pneumonia versus asymmetric pulmonary edema. No gross pneumothorax.  Evaluation limited by overlying structures. Electronically Signed   By: Genia Del M.D.   On: 02/22/2017 10:52   Dg Abd Portable 1v  Result Date: 02/23/2017 CLINICAL DATA:  Nasogastric tube placement EXAM: PORTABLE ABDOMEN - 1 VIEW COMPARISON:  Yesterday FINDINGS: Nasogastric tube tip overlaps the distal stomach. Central line with tip at the upper cavoatrial junction. Chest x-ray was obtained earlier today. Stable bowel gas pattern with mild gaseous distention of right colon and distal stomach. Cholecystectomy clips. Artifact from defibrillator pads. IMPRESSION: Nasogastric tube tip overlaps the  distal stomach. Electronically Signed   By: Monte Fantasia M.D.   On: 02/23/2017 09:25   Dg Abd Portable 1v  Result Date: 02/22/2017 CLINICAL DATA:  Orogastric tube placement EXAM: PORTABLE ABDOMEN - 1 VIEW COMPARISON:  None. FINDINGS: No appreciable orogastric tube. There is mild dilatation in the transverse colon region. No small bowel dilatation. No air-fluid level. No free air evident. There are surgical clips the right upper quadrant. IMPRESSION: No orogastric tube evident. Question a degree of colonic ileus. There does not appear to be bowel obstruction or free air. Electronically Signed   By: Lowella Grip III M.D.   On: 02/22/2017 10:51     Medications:   . azithromycin Stopped (02/23/17 1250)  . ceFEPime (MAXIPIME) IV Stopped (02/24/17 0308)  . fentaNYL infusion  INTRAVENOUS Stopped (02/24/17 0930)  . furosemide 120 mg (02/24/17 0935)  . norepinephrine (LEVOPHED) Adult infusion Stopped (02/24/17 0904)  . propofol (DIPRIVAN) infusion Stopped (02/24/17 0834)   . chlorhexidine gluconate (MEDLINE KIT)  15 mL Mouth Rinse BID  . famotidine  20 mg Per Tube QHS  . feeding supplement (PRO-STAT SUGAR FREE 64)  60 mL Per Tube QID  . feeding supplement (VITAL HIGH PROTEIN)  1,000 mL Per Tube Q24H  . insulin aspart  0-15 Units Subcutaneous Q4H  . insulin glargine  20 Units Subcutaneous QHS  . mouth rinse  15 mL Mouth Rinse QID  acetaminophen **OR** [DISCONTINUED] acetaminophen, [DISCONTINUED] ondansetron **OR** ondansetron (ZOFRAN) IV  Assessment/ Plan:  61 y.o. female with past medical history of hypothyroidism, atrial fibrillation, diabetes mellitus type 2, hypertension, obesity, GERD, osteoporosis, asthma, allergic rhinitis, chronic back pain, hyperlipidemia, B12 deficiency, and obstructive sleep apnea who was admitted with PEA arrest.    1. Acute renal failure/Chronic kidney disease stage IV/proteinuria/diabetes mellitus type 2 with chronic kidney disease/history of NSAID use.  Patient no longer oliguric. Urine output was 1.5 L over the preceding 24 hours. Therefore no acute indication for dialysis at the moment. We will continue to monitor renal parameters closely.  2. Hyperkalemia. Potassium down to 4.7 today. Continue to monitor.  3. Acute respiratory failure/PEA arrest. Patient remains on the ventilator at this point in time. ABG was performed this a.m. and PCO2 was slightly high at 52. Vent management as per pulmonary/medical care.   LOS: 2 Andrea Rivers 7/19/20189:50 AM

## 2017-02-24 NOTE — Progress Notes (Signed)
Pt sedation has been turned off and pt is now on pressure support. Tolerating well. Sat's at 94%. Pt in no apparent distress. Will continue to monitor.

## 2017-02-24 NOTE — Progress Notes (Signed)
Pt. Was suctioned prior to extubation for a small amount of thick white secretions. Per Dr. Alva Garnet order, she was extubated to 4 L nasal cannula. She is talking and without stridor after extubation.

## 2017-02-25 ENCOUNTER — Inpatient Hospital Stay: Payer: Medicare Other

## 2017-02-25 LAB — CBC
HEMATOCRIT: 26.9 % — AB (ref 35.0–47.0)
Hemoglobin: 8.3 g/dL — ABNORMAL LOW (ref 12.0–16.0)
MCH: 20.8 pg — ABNORMAL LOW (ref 26.0–34.0)
MCHC: 30.8 g/dL — ABNORMAL LOW (ref 32.0–36.0)
MCV: 67.6 fL — AB (ref 80.0–100.0)
Platelets: 233 10*3/uL (ref 150–440)
RBC: 3.98 MIL/uL (ref 3.80–5.20)
RDW: 20.1 % — ABNORMAL HIGH (ref 11.5–14.5)
WBC: 17.3 10*3/uL — AB (ref 3.6–11.0)

## 2017-02-25 LAB — COMPREHENSIVE METABOLIC PANEL
ALT: 21 U/L (ref 14–54)
AST: 23 U/L (ref 15–41)
Albumin: 3 g/dL — ABNORMAL LOW (ref 3.5–5.0)
Alkaline Phosphatase: 70 U/L (ref 38–126)
Anion gap: 11 (ref 5–15)
BILIRUBIN TOTAL: 1.2 mg/dL (ref 0.3–1.2)
BUN: 53 mg/dL — AB (ref 6–20)
CHLORIDE: 100 mmol/L — AB (ref 101–111)
CO2: 30 mmol/L (ref 22–32)
CREATININE: 1.96 mg/dL — AB (ref 0.44–1.00)
Calcium: 8.6 mg/dL — ABNORMAL LOW (ref 8.9–10.3)
GFR calc Af Amer: 31 mL/min — ABNORMAL LOW (ref >60)
GFR, EST NON AFRICAN AMERICAN: 26 mL/min — AB (ref >60)
Glucose, Bld: 175 mg/dL — ABNORMAL HIGH (ref 65–99)
Potassium: 4.1 mmol/L (ref 3.5–5.1)
Sodium: 141 mmol/L (ref 135–145)
TOTAL PROTEIN: 7.2 g/dL (ref 6.5–8.1)

## 2017-02-25 LAB — TYPE AND SCREEN
ABO/RH(D): O POS
ANTIBODY SCREEN: NEGATIVE
UNIT DIVISION: 0

## 2017-02-25 LAB — GLUCOSE, CAPILLARY
GLUCOSE-CAPILLARY: 156 mg/dL — AB (ref 65–99)
GLUCOSE-CAPILLARY: 171 mg/dL — AB (ref 65–99)
GLUCOSE-CAPILLARY: 188 mg/dL — AB (ref 65–99)
GLUCOSE-CAPILLARY: 209 mg/dL — AB (ref 65–99)
GLUCOSE-CAPILLARY: 223 mg/dL — AB (ref 65–99)
Glucose-Capillary: 162 mg/dL — ABNORMAL HIGH (ref 65–99)

## 2017-02-25 LAB — CULTURE, RESPIRATORY
CULTURE: NORMAL
SPECIAL REQUESTS: NORMAL

## 2017-02-25 LAB — CULTURE, RESPIRATORY W GRAM STAIN

## 2017-02-25 LAB — PROCALCITONIN: Procalcitonin: 16.93 ng/mL

## 2017-02-25 LAB — BPAM RBC
Blood Product Expiration Date: 201807292359
ISSUE DATE / TIME: 201807190749
UNIT TYPE AND RH: 5100

## 2017-02-25 LAB — PROTIME-INR
INR: 3.08
PROTHROMBIN TIME: 32.5 s — AB (ref 11.4–15.2)

## 2017-02-25 LAB — ECHOCARDIOGRAM COMPLETE
Height: 66 in
Weight: 5152 oz

## 2017-02-25 MED ORDER — AMIODARONE HCL IN DEXTROSE 360-4.14 MG/200ML-% IV SOLN
30.0000 mg/h | INTRAVENOUS | Status: DC
  Administered 2017-02-25 – 2017-02-26 (×2): 30 mg/h via INTRAVENOUS
  Filled 2017-02-25: qty 200

## 2017-02-25 MED ORDER — ACETAMINOPHEN 325 MG PO TABS
650.0000 mg | ORAL_TABLET | Freq: Four times a day (QID) | ORAL | Status: DC | PRN
  Administered 2017-02-25: 650 mg via ORAL

## 2017-02-25 MED ORDER — AMIODARONE LOAD VIA INFUSION
150.0000 mg | Freq: Once | INTRAVENOUS | Status: DC
  Filled 2017-02-25: qty 83.34

## 2017-02-25 MED ORDER — AMIODARONE HCL IN DEXTROSE 360-4.14 MG/200ML-% IV SOLN
60.0000 mg/h | INTRAVENOUS | Status: DC
  Administered 2017-02-25: 60 mg/h via INTRAVENOUS
  Filled 2017-02-25 (×2): qty 200

## 2017-02-25 MED ORDER — OXYCODONE HCL 5 MG PO TABS
5.0000 mg | ORAL_TABLET | ORAL | Status: DC | PRN
  Administered 2017-02-25 – 2017-03-03 (×14): 5 mg via ORAL
  Filled 2017-02-25 (×14): qty 1

## 2017-02-25 MED ORDER — METOPROLOL TARTRATE 25 MG PO TABS
25.0000 mg | ORAL_TABLET | Freq: Four times a day (QID) | ORAL | Status: DC
  Administered 2017-02-25 – 2017-02-28 (×11): 25 mg via ORAL
  Filled 2017-02-25 (×15): qty 1

## 2017-02-25 MED ORDER — FENTANYL CITRATE (PF) 100 MCG/2ML IJ SOLN
50.0000 ug | Freq: Once | INTRAMUSCULAR | Status: AC
  Administered 2017-02-25: 50 ug via INTRAVENOUS
  Filled 2017-02-25: qty 2

## 2017-02-25 NOTE — Progress Notes (Signed)
[place] [facility]                                                                                                                                                                                  Patient Demographics   Andrea Rivers, is a 61 y.o. female, DOB - [birth year], IFO:[id number]  Admit date - 02/22/2017   Admitting Physician Dustin Flock, MD  Outpatient Primary MD for the patient is Gauger, Victoriano Lain, NP   LOS - 3  Subjective: Patient extubated states that she's having pain in the right arm Breathing improved  Review of Systems:     CONSTITUTIONAL: No documented fever. No fatigue, weakness. No weight gain, no weight loss.  EYES: No blurry or double vision.  ENT: No tinnitus. No postnasal drip. No redness of the oropharynx.  RESPIRATORY: No cough, no wheeze, no hemoptysis. No dyspnea.  CARDIOVASCULAR: No chest pain. No orthopnea. No palpitations. No syncope.  GASTROINTESTINAL: No nausea, no vomiting or diarrhea. No abdominal pain. No melena or hematochezia.  GENITOURINARY:  No urgency. No frequency. No dysuria. No hematuria. No obstructive symptoms. No discharge. No pain. No significant abnormal bleeding ENDOCRINE: No polyuria or nocturia. No heat or cold intolerance.  HEMATOLOGY: No anemia. No bruising. No bleeding. No purpura. No petechiae INTEGUMENTARY: No rashes. No lesions.  MUSCULOSKELETAL: Complains of pain in the right arm has a history of bursitis NEUROLOGIC: No numbness, tingling, or ataxia. No seizure-type activity.  PSYCHIATRIC: No anxiety. No insomnia. No ADD.       Vitals:   Vitals:   02/25/17 1300 02/25/17 1400 02/25/17 1415 02/25/17 1430  BP: (!) 120/59 128/65  133/88  Pulse: 80 82 89 81  Resp: (!) 22 (!) 22 (!) 22 (!) 21  Temp: 100.2 F (37.9 C) (!) 100.4 F (38 C) (!) 100.4 F (38 C) (!) 100.4 F (38 C)  TempSrc:      SpO2: 95% 94% 93% 95%  Weight:      Height:        Wt Readings from Last 3  Encounters:  02/22/17 (!) 322 lb (146.1 kg)  01/26/17 299 lb 6.2 oz (135.8 kg)     Intake/Output Summary (Last 24 hours) at 02/25/17 1500 Last data filed at 02/25/17 1300  Gross per 24 hour  Intake            39.33 ml  Output             5455 ml  Net         -5415.67 ml    Physical Exam:   GENERAL: Critically ill-appearing, Obese HEAD, EYES, EARS, NOSE AND THROAT: Atraumatic, normocephalic.. Pupils equal  and reactive to light. Sclerae anicteric. No conjunctival injection. No oro-pharyngeal erythema.  NECK: Supple. There is no jugular venous distention. No bruits, no lymphadenopathy, no thyromegaly.  HEART: Regularly irregular No murmurs, no rubs, no clicks.  LUNGS: Rhonchus breath sounds bilaterally  ABDOMEN: Soft, flat, nontender, nondistended. Has good bowel sounds. No hepatosplenomegaly appreciated.  EXTREMITIES: No evidence of any cyanosis, clubbing, or peripheral edema.  +2 pedal and radial pulses bilaterally.  NEUROLOGIC: Sedated SKIN: Moist and warm with no rashes appreciated.  Psych: Sedated  LN: No inguinal LN enlargement    Antibiotics   Anti-infectives    Start     Dose/Rate Route Frequency Ordered Stop   02/22/17 1500  ceFEPIme (MAXIPIME) 2 g in dextrose 5 % 50 mL IVPB  Status:  Discontinued     2 g 100 mL/hr over 30 Minutes Intravenous Every 12 hours 02/22/17 1127 02/24/17 1111   02/22/17 1400  vancomycin (VANCOCIN) 1,250 mg in sodium chloride 0.9 % 250 mL IVPB  Status:  Discontinued     1,250 mg 166.7 mL/hr over 90 Minutes Intravenous Every 18 hours 02/22/17 0842 02/23/17 0839   02/22/17 1200  azithromycin (ZITHROMAX) 500 mg in dextrose 5 % 250 mL IVPB  Status:  Discontinued     500 mg 250 mL/hr over 60 Minutes Intravenous Every 24 hours 02/22/17 1113 02/25/17 1054   02/22/17 0845  piperacillin-tazobactam (ZOSYN) IVPB 4.5 g  Status:  Discontinued     4.5 g 200 mL/hr over 30 Minutes Intravenous Every 8 hours 02/22/17 0842 02/22/17 1113   02/22/17 0830   vancomycin (VANCOCIN) IVPB 1000 mg/200 mL premix     1,000 mg 200 mL/hr over 60 Minutes Intravenous  Once 02/22/17 0823 02/22/17 1030   02/22/17 0815  vancomycin (VANCOCIN) injection 2,000 mg  Status:  Discontinued     2,000 mg Intravenous  Once 02/22/17 0804 02/22/17 0823   02/22/17 0815  piperacillin-tazobactam (ZOSYN) IVPB 3.375 g     3.375 g 100 mL/hr over 30 Minutes Intravenous  Once 02/22/17 0804 02/22/17 0925      Medications   Scheduled Meds: . amiodarone  150 mg Intravenous Once  . chlorhexidine gluconate (MEDLINE KIT)  15 mL Mouth Rinse BID  . insulin aspart  0-15 Units Subcutaneous Q4H  . insulin glargine  20 Units Subcutaneous QHS  . mouth rinse  15 mL Mouth Rinse BID   Continuous Infusions: . amiodarone 60 mg/hr (02/25/17 0911)   Followed by  . amiodarone    . norepinephrine (LEVOPHED) Adult infusion Stopped (02/24/17 0904)   PRN Meds:.acetaminophen **OR** [DISCONTINUED] acetaminophen, [DISCONTINUED] ondansetron **OR** ondansetron (ZOFRAN) IV, oxyCODONE, sodium chloride flush   Data Review:   Micro Results Recent Results (from the past 240 hour(s))  Blood Culture (routine x 2)     Status: None (Preliminary result)   Collection Time: 02/22/17  7:55 AM  Result Value Ref Range Status   Specimen Description BLOOD RIGHT AV  Final   Special Requests   Final    BOTTLES DRAWN AEROBIC AND ANAEROBIC Blood Culture results may not be optimal due to an excessive volume of blood received in culture bottles   Culture NO GROWTH 3 DAYS  Final   Report Status PENDING  Incomplete  Blood Culture (routine x 2)     Status: None (Preliminary result)   Collection Time: 02/22/17  8:15 AM  Result Value Ref Range Status   Specimen Description BLOOD RIGHT HAND  Final   Special Requests   Final  BOTTLES DRAWN AEROBIC AND ANAEROBIC Blood Culture results may not be optimal due to an excessive volume of blood received in culture bottles   Culture NO GROWTH 3 DAYS  Final   Report Status  PENDING  Incomplete  Culture, respiratory (NON-Expectorated)     Status: None   Collection Time: 02/22/17 10:00 AM  Result Value Ref Range Status   Specimen Description TRACHEAL ASPIRATE  Final   Special Requests Normal  Final   Gram Stain   Final    FEW WBC PRESENT,BOTH PMN AND MONONUCLEAR NO ORGANISMS SEEN    Culture   Final    Consistent with normal respiratory flora. Performed at [facility], [place] [address] [place], [place] [address]    Report Status 02/25/2017 FINAL  Final  MRSA PCR Screening     Status: None   Collection Time: 02/22/17 10:50 AM  Result Value Ref Range Status   MRSA by PCR NEGATIVE NEGATIVE Final    Comment:        The GeneXpert MRSA Assay (FDA approved for NASAL specimens only), is one component of a comprehensive MRSA colonization surveillance program. It is not intended to diagnose MRSA infection nor to guide or monitor treatment for MRSA infections.   Urine culture     Status: None   Collection Time: 02/22/17 11:26 AM  Result Value Ref Range Status   Specimen Description URINE, CATHETERIZED  Final   Special Requests NONE  Final   Culture   Final    NO GROWTH Performed at [facility], [address] [place], [place] [address]    Report Status 02/24/2017 FINAL  Final    Radiology Reports Dg Chest 1 View  Result Date: 01/27/2017 CLINICAL DATA:  Initial evaluation for acute hypoxia. EXAM: CHEST 1 VIEW COMPARISON:  Prior radiograph from 01/26/2017. FINDINGS: Stable cardiomegaly.  Mediastinal silhouette within normal limits. Lungs normally inflated. Diffuse pulmonary vascular congestion with interstitial edema, improved from previous. No definite pleural effusion. Slight asymmetric opacity within the left lung may reflect edema and/ or infiltrate No pneumothorax. Osseous structures unchanged. IMPRESSION: 1. Stable cardiomegaly with persistent but improved pulmonary edema as compared 01/26/2017. 2. Persistent slight asymmetric opacity  within the left lung as compared to the right. While this may be related to edema, underlying infiltrate could be considered in the correct clinical setting. Electronically Signed   By: Jeannine Boga M.D.   On: 01/27/2017 05:50   Dg Chest 2 View  Result Date: 01/27/2017 CLINICAL DATA:  Shortness of breath and pulmonary edema EXAM: CHEST  2 VIEW COMPARISON:  01/27/2017 and prior exams FINDINGS: Cardiomegaly with pulmonary edema is unchanged. No definite pleural effusions noted. There is no evidence of pneumothorax or acute bony abnormality. IMPRESSION: No significant change in cardiomegaly and pulmonary edema. Electronically Signed   By: Margarette Canada M.D.   On: 01/27/2017 10:38   Dg Abd 1 View  Result Date: 02/24/2017 CLINICAL DATA:  NG tube placement EXAM: ABDOMEN - 1 VIEW COMPARISON:  02/23/2017 FINDINGS: The tip and side port of a gastric tube are noted in the right upper quadrant presumably in the distal stomach. The patient is slightly rotated on current exam. Cardiomegaly is noted with central line catheter tip projecting over the cavoatrial junction. IMPRESSION: 1. Gastric tube in the right upper quadrant suggesting a distal gastric position of the gastric tube tip. 2. Cardiomegaly with central line catheter in the expected location of the cavoatrial junction. Electronically Signed   By: Shanon Brow  Randel Pigg M.D.   On: 02/24/2017 01:59   US Venous Img Lower Bilateral  Result Date: 02/23/2017 CLINICAL DATA:  Bilateral lower extremity edema. EXAM: BILATERAL LOWER EXTREMITY VENOUS DOPPLER ULTRASOUND TECHNIQUE: Gray-scale sonography with graded compression, as well as color Doppler and duplex ultrasound were performed to evaluate the lower extremity deep venous systems from the level of the common femoral vein and including the common femoral, femoral, profunda femoral, popliteal and calf veins including the posterior tibial, peroneal and gastrocnemius veins when visible. The superficial great saphenous  vein was also interrogated. Spectral Doppler was utilized to evaluate flow at rest and with distal augmentation maneuvers in the common femoral, femoral and popliteal veins. COMPARISON:  Ultrasound 06/13/2007. FINDINGS: RIGHT LOWER EXTREMITY Common Femoral Vein: No evidence of thrombus. Normal compressibility, respiratory phasicity and response to augmentation. Saphenofemoral Junction: No evidence of thrombus. Normal compressibility and flow on color Doppler imaging. Profunda Femoral Vein: No evidence of thrombus. Normal compressibility and flow on color Doppler imaging. Femoral Vein: No evidence of thrombus. Normal compressibility, respiratory phasicity and response to augmentation. Popliteal Vein: No evidence of thrombus. Normal compressibility, respiratory phasicity and response to augmentation. Calf Veins: No evidence of thrombus. Normal compressibility and flow on color Doppler imaging. Limited visualization due to edema and patient clinical condition . Superficial Great Saphenous Vein: No evidence of thrombus. Normal compressibility and flow on color Doppler imaging. Other Findings:  None. LEFT LOWER EXTREMITY Common Femoral Vein: No evidence of thrombus. Normal compressibility, respiratory phasicity and response to augmentation. Saphenofemoral Junction: No evidence of thrombus. Normal compressibility and flow on color Doppler imaging. Profunda Femoral Vein: No evidence of thrombus. Normal compressibility and flow on color Doppler imaging. Femoral Vein: No evidence of thrombus. Normal compressibility, respiratory phasicity and response to augmentation. Popliteal Vein: No evidence of thrombus. Normal compressibility, respiratory phasicity and response to augmentation. Calf Veins: No evidence of thrombus. Normal compressibility and flow on color Doppler imaging. Limited visualization due to edema and patient clinical condition . Superficial Great Saphenous Vein: No evidence of thrombus. Normal compressibility  and flow on color Doppler imaging. Other Findings:  None. IMPRESSION: No evidence of DVT within either lower extremity. Electronically Signed   By: Marcello Moores  Register   On: 02/23/2017 12:51   Dg Chest Port 1 View  Result Date: 02/25/2017 CLINICAL DATA:  Respiratory failure. EXAM: PORTABLE CHEST 1 VIEW COMPARISON:  02/23/2017. FINDINGS: Interim extubation removal of NG tube. Right IJ line noted with tip projected over the right atrium. Cardiomegaly with pulmonary venous congestion and bilateral pulmonary infiltrates consistent pulmonary edema. Small bilateral pleural effusions. IMPRESSION: 1. Interim extubation and removal of NG tube. Right IJ line noted with tip projected over the right atrium. 2. Cardiomegaly with bilateral pulmonary venous congestion and pulmonary infiltrates consistent pulmonary edema. Small bilateral pleural effusions. No change from prior exam. Electronically Signed   By: Marcello Moores  Register   On: 02/25/2017 06:48   Dg Chest Port 1 View  Result Date: 02/24/2017 CLINICAL DATA:  Respiratory failure. EXAM: PORTABLE CHEST 1 VIEW COMPARISON:  02/23/2017. FINDINGS: Endotracheal tube, NG tube, right IJ line in stable position. Cardiomegaly with bilateral pulmonary infiltrates/edema and small bilateral pleural effusions. Findings most consistent with congestive heart failure. Slight worsening from prior exam . IMPRESSION: 1. Lines and tubes in stable position. 2. Congestive heart failure pulmonary edema. Slight worsening of pulmonary edema from prior exams. Small bilateral pleural effusions again noted. Electronically Signed   By: Marcello Moores  Register   On: 02/24/2017 07:39   Dg Chest [facility]  1 View  Result Date: 02/23/2017 CLINICAL DATA:  Acute respiratory failure. EXAM: PORTABLE CHEST 1 VIEW COMPARISON:  02/23/2017 chest radiograph. FINDINGS: Right rotated chest radiograph. Endotracheal tube tip is 2.1 cm above the carina. Right internal jugular central venous catheter terminates at the cavoatrial  junction. Enteric tube enters the lower thoracic esophagus, with the tip not seen on this image. Stable cardiomediastinal silhouette with mild cardiomegaly. No pneumothorax. Small bilateral pleural effusions. Mild-to-moderate pulmonary edema. Bibasilar atelectasis. IMPRESSION: 1. Well-positioned endotracheal tube. Enteric tube enters the lower thoracic esophagus with the tip not seen on this image. 2. Mild-to-moderate congestive heart failure. 3. Small bilateral pleural effusions with bibasilar atelectasis. Electronically Signed   By: Ilona Sorrel M.D.   On: 02/23/2017 19:56   Dg Chest Port 1 View  Result Date: 02/23/2017 CLINICAL DATA:  Shortness of breath. EXAM: PORTABLE CHEST 1 VIEW COMPARISON:  02/22/2017. FINDINGS: Surgical clips right chest. Endotracheal tube and right IJ line stable position. NG tube tip remains projected over the lower esophagus, advancement of approximately 20 cm should be considered. Patchy infiltrates in the right lung have improved. Diffuse left lung infiltrate remains. Basilar atelectasis. No prominent pleural effusion. No pneumothorax. Stable cardiomegaly . IMPRESSION: 1. NG tube tip again noted over the lower esophagus. Advancement of approximately 20 cm should be considered. Endotracheal tube and right IJ line stable position. 2. Partial clearing of patchy infiltrates right lung. Diffuse left lung infiltrate remains. Low lung volumes. 3. Stable cardiomegaly. Critical Value/emergent results were called by telephone at the time of interpretation on 02/23/2017 at 6:44 am to nurse Pamala Hurry , who verbally acknowledged these results. Electronically Signed   By: Marcello Moores  Register   On: 02/23/2017 06:48   Dg Chest Port 1 View  Result Date: 02/22/2017 CLINICAL DATA:  61 year old female post CPR.  Subsequent encounter. EXAM: PORTABLE CHEST 1 VIEW COMPARISON:  02/22/2017 8:52 a.m. FINDINGS: Endotracheal tube tip 3.6 cm above the carina. Endotracheal tube tip mid to distal esophagus level  with side hole proximal to mid esophagus level. This needs to be reposition. Right internal jugular catheter tip mid superior vena cava level. Cardiomegaly. Patchy consolidation most notable right upper lobe, left midlung zone and right perihilar region. Question multifocal pneumonia versus asymmetric pulmonary edema. No gross pneumothorax.  Evaluation limited by overlying structures. No obvious rib fracture. IMPRESSION: Endotracheal tube tip 3.6 cm above the carina. Endotracheal tube tip mid to distal esophagus level with side hole proximal to mid esophagus level. This needs to be reposition. Right internal jugular catheter tip mid superior vena cava level. Cardiomegaly. Patchy consolidation most notable right upper lobe, left midlung zone and right perihilar region. Question multifocal pneumonia versus asymmetric pulmonary edema. No gross pneumothorax.  Evaluation limited by overlying structures. Electronically Signed   By: Genia Del M.D.   On: 02/22/2017 10:52   Dg Chest Portable 1 View  Result Date: 02/22/2017 CLINICAL DATA:  Hypoxia EXAM: PORTABLE CHEST 1 VIEW COMPARISON:  Study obtained earlier in the day. FINDINGS: Endotracheal tube tip is 3.2 cm above the carina. No pneumothorax. There is interstitial pulmonary edema with bibasilar atelectasis. No airspace consolidation. There is cardiomegaly with pulmonary venous hypertension. There is aortic atherosclerosis. No adenopathy. No bone lesions. IMPRESSION: Endotracheal tube as described without pneumothorax. Changes of congestive heart failure. No consolidation. There is aortic atherosclerosis. Aortic Atherosclerosis (ICD10-I70.0). Electronically Signed   By: Lowella Grip III M.D.   On: 02/22/2017 09:06   Dg Chest Portable 1 View  Result Date: 02/22/2017 CLINICAL DATA:  Endotracheal tube placement EXAM: PORTABLE CHEST 1 VIEW COMPARISON:  01/27/2017 FINDINGS: Endotracheal tube in good position.  NG tip not well seen. Interval worsening of  diffuse bilateral airspace disease most prominent right upper lobe. Possible edema versus pneumonia. No pleural effusion. Cardiac enlargement IMPRESSION: Endotracheal tube in good position. Significant worsening in bilateral airspace disease which may represent pneumonia or edema. Electronically Signed   By: Franchot Gallo M.D.   On: 02/22/2017 08:16   Dg Abd Portable 1v  Result Date: 02/23/2017 CLINICAL DATA:  Nasogastric tube placement EXAM: PORTABLE ABDOMEN - 1 VIEW COMPARISON:  Yesterday FINDINGS: Nasogastric tube tip overlaps the distal stomach. Central line with tip at the upper cavoatrial junction. Chest x-ray was obtained earlier today. Stable bowel gas pattern with mild gaseous distention of right colon and distal stomach. Cholecystectomy clips. Artifact from defibrillator pads. IMPRESSION: Nasogastric tube tip overlaps the  distal stomach. Electronically Signed   By: Monte Fantasia M.D.   On: 02/23/2017 09:25   Dg Abd Portable 1v  Result Date: 02/22/2017 CLINICAL DATA:  Orogastric tube placement EXAM: PORTABLE ABDOMEN - 1 VIEW COMPARISON:  None. FINDINGS: No appreciable orogastric tube. There is mild dilatation in the transverse colon region. No small bowel dilatation. No air-fluid level. No free air evident. There are surgical clips the right upper quadrant. IMPRESSION: No orogastric tube evident. Question a degree of colonic ileus. There does not appear to be bowel obstruction or free air. Electronically Signed   By: Lowella Grip III M.D.   On: 02/22/2017 10:51     CBC  Recent Labs Lab 02/22/17 0755 02/22/17 1040 02/23/17 0445 02/24/17 0403 02/24/17 1702 02/25/17 0501  WBC 16.1* 34.1* 20.7* 17.5*  --  17.3*  HGB 8.6* 8.3* 7.6* 6.5* 7.8* 8.3*  HCT 30.2* 29.4* 25.8* 22.4* 25.7* 26.9*  PLT 373 377 288 228  --  233  MCV 72.9* 71.5* 70.0* 67.9*  --  67.6*  MCH 20.8* 20.2* 20.7* 19.9*  --  20.8*  MCHC 28.5* 28.2* 29.7* 29.3*  --  30.8*  RDW 20.7* 20.2* 20.2* 19.6*  --  20.1*   LYMPHSABS 2.7  --   --   --   --   --   MONOABS 1.4*  --   --   --   --   --   EOSABS 0.2  --   --   --   --   --   BASOSABS 0.2*  --   --   --   --   --     Chemistries   Recent Labs Lab 02/22/17 0755  02/22/17 1706 02/22/17 1859  02/23/17 0445 02/23/17 0826 02/23/17 1730 02/24/17 0403 02/25/17 0501  NA 139  < > 137 135  --  138  --  138 141 141  K 4.3  < > 5.9* 5.8*  < > 5.3* 5.3* 4.8 4.7 4.1  CL 103  < > 105 102  --  103  --  102 102 100*  CO2 20*  < > 23 22  --  25  --  '27 29 30  ' GLUCOSE 356*  < > 422* 390*  --  316*  --  116* 115* 175*  BUN 37*  < > 49* 51*  --  56*  --  59* 66* 53*  CREATININE 2.11*  < > 2.32* 2.34*  --  2.50*  --  2.75* 2.77* 1.96*  CALCIUM 8.6*  < > 7.7* 7.8*  --  7.5*  --  7.6*  7.7* 8.6*  MG  --   --  2.2  --   --   --   --   --   --   --   AST 38  --   --   --   --   --   --   --  29 23  ALT 17  --   --   --   --   --   --   --  24 21  ALKPHOS 87  --   --   --   --   --   --   --  58 70  BILITOT 0.5  --   --   --   --   --   --   --  0.8 1.2  < > = values in this interval not displayed. ------------------------------------------------------------------------------------------------------------------ estimated creatinine clearance is 44.7 mL/min (A) (by C-G formula based on SCr of 1.96 mg/dL (H)). ------------------------------------------------------------------------------------------------------------------ No results for input(s): HGBA1C in the last 72 hours. ------------------------------------------------------------------------------------------------------------------  Recent Labs  02/22/17 1706  TRIG 68   ------------------------------------------------------------------------------------------------------------------ No results for input(s): TSH, T4TOTAL, T3FREE, THYROIDAB in the last 72 hours.  Invalid input(s):  FREET3 ------------------------------------------------------------------------------------------------------------------ No results for input(s): VITAMINB12, FOLATE, FERRITIN, TIBC, IRON, RETICCTPCT in the last 72 hours.  Coagulation profile  Recent Labs Lab 02/22/17 0755 02/23/17 0445 02/24/17 0403 02/25/17 0501  INR 4.33* 5.89* 7.14* 3.08    No results for input(s): DDIMER in the last 72 hours.  Cardiac Enzymes  Recent Labs Lab 02/22/17 1040 02/22/17 1706 02/22/17 2243  TROPONINI 0.04* 0.08* 0.09*   ------------------------------------------------------------------------------------------------------------------ Invalid input(s): POCBNP    Assessment & Plan   IMPRESSION AND PLAN: Patient is a 61 year old status post respiratory arrest  1. Acute respiratory failure Due to pneumonia- continue IV cefepime Extubated Pulmonary was to watch patient on the ICU for another day  2. Status post cardiac arrest Suspect due to her underlying lung issues  3. Diabetes type 2 continue sliding scale insulin,   4. History of hypertension blood pressure stable  5. Hypothyroidism  TSH is suppressed Likely due to euthyroid sick syndrome  6. Morbid obesity  7.Acute renal failure on chronic kidney disease nephrology consult appreciated urine output stable renal function continues to improve       Code Status Orders        Start     Ordered   02/22/17 1004  Full code  Continuous     02/22/17 1003    Code Status History    Date Active Date Inactive Code Status Order ID Comments User Context   01/26/2017  4:36 PM 01/28/2017 12:34 PM Full Code [id number]  Baxter Hire, MD Inpatient           Consults Pulmonary critical care, nephrology  DVT Prophylaxis SCDs  Lab Results  Component Value Date   PLT 233 02/25/2017     Time Spent in minutes 107mn Greater than 50% of time spent in care coordination and counseling patient regarding the condition and  plan of care.   PDustin FlockM.D on 02/25/2017 at 3:00 PM  Between 7am to 6pm - Pager - [phone]  After 6pm go to [email or web address] - password [email or web address]   Office  [phone]

## 2017-02-25 NOTE — Progress Notes (Signed)
[facility]  ROUNDING NOTE   Subjective:  Patient seen at bedside. Creatinine down to 1.96. Patient had excellent urine output yesterday. Patient now extubated.  Objective:  Vital signs in last 24 hours:  Temp:  [98.1 F (36.7 C)-100 F (37.8 C)] 100 F (37.8 C) (07/20 0900) Pulse Rate:  [35-91] 62 (07/20 0900) Resp:  [14-20] 20 (07/20 0900) BP: (124-168)/(60-92) 160/64 (07/20 0900) SpO2:  [90 %-97 %] 96 % (07/20 0900)  Weight change:  Filed Weights   02/22/17 0813  Weight: (!) 146.1 kg (322 lb)    Intake/Output: I/O last 3 completed shifts: In: 2447.6 [I.V.:1068.2; Blood:670; NG/GT:409.3; IV Piggyback:300] Out: 9330 [Urine:9330]   Intake/Output this shift:  Total I/O In: -  Out: 700 [Urine:700]  Physical Exam: General: Resting in bed  Head: [place]/AT hearing intact OM moist  Eyes: Anicteric  Neck: Supple, trachea midline  Lungs:  Bilateral rhonchi, breath sounds vent assisted   Heart: S1S2 no rubs  Abdomen:  Soft, nontender, bowel sounds present  Extremities: 1+ peripheral edema.  Neurologic: Awake, alert, follows commands  Skin: No lesions       Basic Metabolic Panel:  Recent Labs Lab 02/22/17 1706 02/22/17 1859  02/23/17 0445 02/23/17 0826 02/23/17 1730 02/24/17 0403 02/25/17 0501  NA 137 135  --  138  --  138 141 141  K 5.9* 5.8*  < > 5.3* 5.3* 4.8 4.7 4.1  CL 105 102  --  103  --  102 102 100*  CO2 23 22  --  25  --  '27 29 30  ' GLUCOSE 422* 390*  --  316*  --  116* 115* 175*  BUN 49* 51*  --  56*  --  59* 66* 53*  CREATININE 2.32* 2.34*  --  2.50*  --  2.75* 2.77* 1.96*  CALCIUM 7.7* 7.8*  --  7.5*  --  7.6* 7.7* 8.6*  MG 2.2  --   --   --   --   --   --   --   < > = values in this interval not displayed.  Liver Function Tests:  Recent Labs Lab 02/22/17 0755 02/24/17 0403 02/25/17 0501  AST 38 29 23  ALT '17 24 21  ' ALKPHOS 87 58 70  BILITOT 0.5 0.8 1.2  PROT 7.8 6.4* 7.2  ALBUMIN 3.5 3.0* 3.0*   No results for  input(s): LIPASE, AMYLASE in the last 168 hours. No results for input(s): AMMONIA in the last 168 hours.  CBC:  Recent Labs Lab 02/22/17 0755 02/22/17 1040 02/23/17 0445 02/24/17 0403 02/24/17 1702 02/25/17 0501  WBC 16.1* 34.1* 20.7* 17.5*  --  17.3*  NEUTROABS 11.6*  --   --   --   --   --   HGB 8.6* 8.3* 7.6* 6.5* 7.8* 8.3*  HCT 30.2* 29.4* 25.8* 22.4* 25.7* 26.9*  MCV 72.9* 71.5* 70.0* 67.9*  --  67.6*  PLT 373 377 288 228  --  233    Cardiac Enzymes:  Recent Labs Lab 02/22/17 0755 02/22/17 1040 02/22/17 1706 02/22/17 2243  TROPONINI <0.03 0.04* 0.08* 0.09*    BNP: Invalid input(s): POCBNP  CBG:  Recent Labs Lab 02/24/17 1631 02/24/17 1921 02/24/17 2330 02/25/17 0359 02/25/17 0817  GLUCAP 199* 214* 188* 162* 156*    Microbiology: Results for orders placed or performed during the hospital encounter of 02/22/17  Blood Culture (routine x 2)     Status: None (Preliminary result)   Collection Time: 02/22/17  7:55 AM  Result Value Ref Range Status   Specimen Description BLOOD RIGHT AV  Final   Special Requests   Final    BOTTLES DRAWN AEROBIC AND ANAEROBIC Blood Culture results may not be optimal due to an excessive volume of blood received in culture bottles   Culture NO GROWTH 3 DAYS  Final   Report Status PENDING  Incomplete  Blood Culture (routine x 2)     Status: None (Preliminary result)   Collection Time: 02/22/17  8:15 AM  Result Value Ref Range Status   Specimen Description BLOOD RIGHT HAND  Final   Special Requests   Final    BOTTLES DRAWN AEROBIC AND ANAEROBIC Blood Culture results may not be optimal due to an excessive volume of blood received in culture bottles   Culture NO GROWTH 3 DAYS  Final   Report Status PENDING  Incomplete  Culture, respiratory (NON-Expectorated)     Status: None   Collection Time: 02/22/17 10:00 AM  Result Value Ref Range Status   Specimen Description TRACHEAL ASPIRATE  Final   Special Requests Normal  Final    Gram Stain   Final    FEW WBC PRESENT,BOTH PMN AND MONONUCLEAR NO ORGANISMS SEEN    Culture   Final    Consistent with normal respiratory flora. Performed at [facility], [place] [address], [place], [place] [address]    Report Status 02/25/2017 FINAL  Final  MRSA PCR Screening     Status: None   Collection Time: 02/22/17 10:50 AM  Result Value Ref Range Status   MRSA by PCR NEGATIVE NEGATIVE Final    Comment:        The GeneXpert MRSA Assay (FDA approved for NASAL specimens only), is one component of a comprehensive MRSA colonization surveillance program. It is not intended to diagnose MRSA infection nor to guide or monitor treatment for MRSA infections.   Urine culture     Status: None   Collection Time: 02/22/17 11:26 AM  Result Value Ref Range Status   Specimen Description URINE, CATHETERIZED  Final   Special Requests NONE  Final   Culture   Final    NO GROWTH Performed at [facility], [address], [place], [place] [address]    Report Status 02/24/2017 FINAL  Final    Coagulation Studies:  Recent Labs  02/23/17 0445 02/24/17 0403 02/25/17 0501  LABPROT 54.5* 63.6* 32.5*  INR 5.89* 7.14* 3.08    Urinalysis:  Recent Labs  02/22/17 1126  COLORURINE YELLOW*  LABSPEC 1.011  PHURINE 5.0  GLUCOSEU >=500*  HGBUR NEGATIVE  BILIRUBINUR NEGATIVE  KETONESUR NEGATIVE  PROTEINUR 100*  NITRITE NEGATIVE  LEUKOCYTESUR NEGATIVE      Imaging: Dg Abd 1 View  Result Date: 02/24/2017 CLINICAL DATA:  NG tube placement EXAM: ABDOMEN - 1 VIEW COMPARISON:  02/23/2017 FINDINGS: The tip and side port of a gastric tube are noted in the right upper quadrant presumably in the distal stomach. The patient is slightly rotated on current exam. Cardiomegaly is noted with central line catheter tip projecting over the cavoatrial junction. IMPRESSION: 1. Gastric tube in the right upper quadrant suggesting a distal gastric position of the gastric tube tip. 2. Cardiomegaly  with central line catheter in the expected location of the cavoatrial junction. Electronically Signed   By: Ashley Royalty M.D.   On: 02/24/2017 01:59   US Venous Img Lower Bilateral  Result Date: 02/23/2017 CLINICAL DATA:  Bilateral lower extremity edema. EXAM:  BILATERAL LOWER EXTREMITY VENOUS DOPPLER ULTRASOUND TECHNIQUE: Gray-scale sonography with graded compression, as well as color Doppler and duplex ultrasound were performed to evaluate the lower extremity deep venous systems from the level of the common femoral vein and including the common femoral, femoral, profunda femoral, popliteal and calf veins including the posterior tibial, peroneal and gastrocnemius veins when visible. The superficial great saphenous vein was also interrogated. Spectral Doppler was utilized to evaluate flow at rest and with distal augmentation maneuvers in the common femoral, femoral and popliteal veins. COMPARISON:  Ultrasound 06/13/2007. FINDINGS: RIGHT LOWER EXTREMITY Common Femoral Vein: No evidence of thrombus. Normal compressibility, respiratory phasicity and response to augmentation. Saphenofemoral Junction: No evidence of thrombus. Normal compressibility and flow on color Doppler imaging. Profunda Femoral Vein: No evidence of thrombus. Normal compressibility and flow on color Doppler imaging. Femoral Vein: No evidence of thrombus. Normal compressibility, respiratory phasicity and response to augmentation. Popliteal Vein: No evidence of thrombus. Normal compressibility, respiratory phasicity and response to augmentation. Calf Veins: No evidence of thrombus. Normal compressibility and flow on color Doppler imaging. Limited visualization due to edema and patient clinical condition . Superficial Great Saphenous Vein: No evidence of thrombus. Normal compressibility and flow on color Doppler imaging. Other Findings:  None. LEFT LOWER EXTREMITY Common Femoral Vein: No evidence of thrombus. Normal compressibility, respiratory  phasicity and response to augmentation. Saphenofemoral Junction: No evidence of thrombus. Normal compressibility and flow on color Doppler imaging. Profunda Femoral Vein: No evidence of thrombus. Normal compressibility and flow on color Doppler imaging. Femoral Vein: No evidence of thrombus. Normal compressibility, respiratory phasicity and response to augmentation. Popliteal Vein: No evidence of thrombus. Normal compressibility, respiratory phasicity and response to augmentation. Calf Veins: No evidence of thrombus. Normal compressibility and flow on color Doppler imaging. Limited visualization due to edema and patient clinical condition . Superficial Great Saphenous Vein: No evidence of thrombus. Normal compressibility and flow on color Doppler imaging. Other Findings:  None. IMPRESSION: No evidence of DVT within either lower extremity. Electronically Signed   By: Marcello Moores  Register   On: 02/23/2017 12:51   Dg Chest Port 1 View  Result Date: 02/25/2017 CLINICAL DATA:  Respiratory failure. EXAM: PORTABLE CHEST 1 VIEW COMPARISON:  02/23/2017. FINDINGS: Interim extubation removal of NG tube. Right IJ line noted with tip projected over the right atrium. Cardiomegaly with pulmonary venous congestion and bilateral pulmonary infiltrates consistent pulmonary edema. Small bilateral pleural effusions. IMPRESSION: 1. Interim extubation and removal of NG tube. Right IJ line noted with tip projected over the right atrium. 2. Cardiomegaly with bilateral pulmonary venous congestion and pulmonary infiltrates consistent pulmonary edema. Small bilateral pleural effusions. No change from prior exam. Electronically Signed   By: Marcello Moores  Register   On: 02/25/2017 06:48   Dg Chest Port 1 View  Result Date: 02/24/2017 CLINICAL DATA:  Respiratory failure. EXAM: PORTABLE CHEST 1 VIEW COMPARISON:  02/23/2017. FINDINGS: Endotracheal tube, NG tube, right IJ line in stable position. Cardiomegaly with bilateral pulmonary infiltrates/edema  and small bilateral pleural effusions. Findings most consistent with congestive heart failure. Slight worsening from prior exam . IMPRESSION: 1. Lines and tubes in stable position. 2. Congestive heart failure pulmonary edema. Slight worsening of pulmonary edema from prior exams. Small bilateral pleural effusions again noted. Electronically Signed   By: Marcello Moores  Register   On: 02/24/2017 07:39   Dg Chest Port 1 View  Result Date: 02/23/2017 CLINICAL DATA:  Acute respiratory failure. EXAM: PORTABLE CHEST 1 VIEW COMPARISON:  02/23/2017 chest radiograph. FINDINGS: Right rotated chest  radiograph. Endotracheal tube tip is 2.1 cm above the carina. Right internal jugular central venous catheter terminates at the cavoatrial junction. Enteric tube enters the lower thoracic esophagus, with the tip not seen on this image. Stable cardiomediastinal silhouette with mild cardiomegaly. No pneumothorax. Small bilateral pleural effusions. Mild-to-moderate pulmonary edema. Bibasilar atelectasis. IMPRESSION: 1. Well-positioned endotracheal tube. Enteric tube enters the lower thoracic esophagus with the tip not seen on this image. 2. Mild-to-moderate congestive heart failure. 3. Small bilateral pleural effusions with bibasilar atelectasis. Electronically Signed   By: Ilona Sorrel M.D.   On: 02/23/2017 19:56     Medications:   . amiodarone 60 mg/hr (02/25/17 0911)   Followed by  . amiodarone    . azithromycin Stopped (02/24/17 1300)  . norepinephrine (LEVOPHED) Adult infusion Stopped (02/24/17 0904)   . amiodarone  150 mg Intravenous Once  . chlorhexidine gluconate (MEDLINE KIT)  15 mL Mouth Rinse BID  . famotidine  20 mg Per Tube QHS  . insulin aspart  0-15 Units Subcutaneous Q4H  . insulin glargine  20 Units Subcutaneous QHS  . mouth rinse  15 mL Mouth Rinse BID   acetaminophen **OR** [DISCONTINUED] acetaminophen, [DISCONTINUED] ondansetron **OR** ondansetron (ZOFRAN) IV, oxyCODONE, sodium chloride  flush  Assessment/ Plan:  61 y.o. female with past medical history of hypothyroidism, atrial fibrillation, diabetes mellitus type 2, hypertension, obesity, GERD, osteoporosis, asthma, allergic rhinitis, chronic back pain, hyperlipidemia, B12 deficiency, and obstructive sleep apnea who was admitted with PEA arrest.    1. Acute renal failure/Chronic kidney disease stage IV/proteinuria/diabetes mellitus type 2 with chronic kidney disease/history of NSAID use.   - Patient had excellent urine output over the preceding 24 hours. Renal function has also improved. No acute indication for dialysis at the moment. Continue to monitor renal parameters daily for now.  2. Hyperkalemia. Resolved, serum potassium down to 4.1.  3. Acute respiratory failure/PEA arrest. Extubated 02/24/17.  Patient doing well off the ventilator at the moment. Continue to monitor her respiratory status.   LOS: 3 Quincey Nored 7/20/20189:28 AM

## 2017-02-25 NOTE — Progress Notes (Signed)
PULMONARY / CRITICAL CARE MEDICINE   Name: Andrea Rivers MRN: [id number] DOB: [birth year]    ADMISSION DATE:  02/22/2017  PT PROFILE: 61 y.o. F with multiple medical problems including severe obesity awoke in the middle of the night with dyspnea. Her husband called EMS. She suffered cardiopulmonary arrest en route to the emergency department. She underwent resuscitation and ultimately intubation in the emergency department. Upon arrival to the ICU, she again suffered PEA arrest. She is on chronic warfarin for unknown reasons with admission INR of 4.33.   MAJOR EVENTS/TEST RESULTS: 07/17 admitted via ED with cardiopulmonary arrest 2, severe hypoxemia, persistent shock. 07/18 echocardiogram: 07/18 LE venous US: No evidence of DVT within either lower extremity 07/18 Reducing vasopressor requirements, reducing ventilatory support requirements, improving Uo 07/18 Echocardiogram:  07/19 Passed SBT, + F/C. Extubated and tolerating. One unit PRBCs transfused for Hgb 6.5  INDWELLING DEVICES:: ETT 07/17 >> 07/19 R IJ CVL 07/17 >>   MICRO DATA: MRSA PCR 07/17 >> NEG Strep antigen 07/17 >> NEG Urine 07/17 >> NEG Legionella antigen 07/17 >>  Resp 07/17 >>  Blood 07/17 >>   ANTIMICROBIALS:  Vancomycin 07/17 >> 07/18 Cefepime 07/17 >>  Azithromycin 07/17 >>   PCT 07/7: <0.10, 48.52, 42.93   SUBJECTIVE:  Patient remained extubated.  Afebrile.Had an uneventful night.  VITAL SIGNS: BP (!) 149/61   Pulse 80   Temp 99.5 F (37.5 C)   Resp 19   Ht 5\' 6"  (1.676 m)   Wt (!) 322 lb (146.1 kg)   SpO2 95%   BMI 51.97 kg/m   HEMODYNAMICS:    VENTILATOR SETTINGS: Vent Mode: PSV FiO2 (%):  [40 %] 40 % Set Rate:  [18 bmp] 18 bmp Vt Set:  [500 mL] 500 mL PEEP:  [5 cmH20-8 cmH20] 5 cmH20 Pressure Support:  [5 cmH20] 5 cmH20  INTAKE / OUTPUT: I/O last 3 completed shifts: In: 4058.1 [I.V.:2063.8; Blood:670; NG/GT:424.3; IV Piggyback:900] Out: 4403 [Urine:6675]  PHYSICAL  EXAMINATION: General: RASS -1, + F/C Neuro: PERRL, EOMI, MAEs HEENT: NCAT, sclerae white Cardiovascular: NSR, no murmurs Lungs: diminished bibasilar, no wheezes,crackles  Abdomen: Extremely obese, diminished to absent bowel sounds, soft Extremities: Cool, symmetric ankle edema  LABS:  BMET  Recent Labs Lab 02/23/17 0445 02/23/17 0826 02/23/17 1730 02/24/17 0403  NA 138  --  138 141  K 5.3* 5.3* 4.8 4.7  CL 103  --  102 102  CO2 25  --  27 29  BUN 56*  --  59* 66*  CREATININE 2.50*  --  2.75* 2.77*  GLUCOSE 316*  --  116* 115*    Electrolytes  Recent Labs Lab 02/22/17 1706  02/23/17 0445 02/23/17 1730 02/24/17 0403  CALCIUM 7.7*  < > 7.5* 7.6* 7.7*  MG 2.2  --   --   --   --   < > = values in this interval not displayed.  CBC  Recent Labs Lab 02/22/17 1040 02/23/17 0445 02/24/17 0403 02/24/17 1702  WBC 34.1* 20.7* 17.5*  --   HGB 8.3* 7.6* 6.5* 7.8*  HCT 29.4* 25.8* 22.4* 25.7*  PLT 377 288 228  --     Coag's  Recent Labs Lab 02/22/17 0755 02/23/17 0445 02/24/17 0403  APTT 49* 88*  --   INR 4.33* 5.89* 7.14*    Sepsis Markers  Recent Labs Lab 02/22/17 0755 02/22/17 0800 02/22/17 1038 02/23/17 0445 02/24/17 0403  LATICACIDVEN  --  9.4* 4.3*  --   --   PROCALCITON <  0.10  --   --  48.52 42.93    ABG  Recent Labs Lab 02/22/17 1340 02/23/17 1130 02/24/17 0511  PHART 7.20* 7.31* 7.39  PCO2ART 53* 47 52*  PO2ART 51* 96 92    Liver Enzymes  Recent Labs Lab 02/22/17 0755 02/24/17 0403  AST 38 29  ALT 17 24  ALKPHOS 87 58  BILITOT 0.5 0.8  ALBUMIN 3.5 3.0*    Cardiac Enzymes  Recent Labs Lab 02/22/17 1040 02/22/17 1706 02/22/17 2243  TROPONINI 0.04* 0.08* 0.09*    Glucose  Recent Labs Lab 02/24/17 0740 02/24/17 1111 02/24/17 1228 02/24/17 1631 02/24/17 1921 02/24/17 2330  GLUCAP 110* 130* 164* 199* 214* 188*    CXR: CM, mild edema pattern   ASSESSMENT / PLAN:  PULMONARY A: Acute respiratory  failure with hypoxemia Ventilator dependence, resolved - extubated on 7/19 Suspected pneumonia, NOS Pulmonary edema - improving P:   Supplemental O2 to maintain SPO2 > 90% Continue Azithromycin   CARDIOVASCULAR A:  Cardiac arrest 2 07/17 (PEA) - etiology unclear Minimally elevated trop I - doubt acute coronary syndrome Shock, presumed septic in origin - improving. Remains on low dose NE Recent atrial fibrillation - NSR presently Relative bradycardia, resolved P:  Cont NE to maintain MAP goal > 65 mmHg F/U Echocardiogram results (performed 07/18)  RENAL A:   AKI, now non-oliguric Mild metabolic acidosis, resolved Hyperkalemia, resolved P:   Monitor BMET intermittently Monitor I/Os Correct electrolytes as indicated Discontinue HCO3 infusion 07/18   GASTROINTESTINAL A:   Severe obesity Post extubation dysphagia P:   SUP: Famotidine per tube Will leave NGT for now Continue TFs - initiated 07/18  HEMATOLOGIC A:   Anemia without evidence of acute blood loss Chronic warfarin therapy  Prolonged INR P:  DVT px: SCDs Monitor CBC intermittently Transfuse per usual guidelines Holding warfarin Vitamin K 1 dose 07/19 Monitor prothrombin time  INFECTIOUS A:   Severe sepsis Elevated PCT Suspected HCAP, NOS P:   Monitor temp, WBC count Micro and abx as above  ENDOCRINE A:   DM 2 Severe hyperglycemia, resolved P:   Transitioned from insulin infusion to SSI with Lantus  NEUROLOGIC A:   Post-anoxic encephalopathy - resolving P:   RASS goal: 0 Minimize all sedating medications    Bincy Varughese,AG-ACNP Pulmonary & Critical Care

## 2017-02-25 NOTE — Consult Note (Signed)
[facility] Cardiology  CARDIOLOGY CONSULT NOTE  Patient ID: Andrea Rivers MRN: [id number] DOB/AGE: 1955/09/12 61 y.o.  Admit date: 02/22/2017 Referring Physician Kasa Primary Physician Gauger, Victoriano Lain, NP  Primary Cardiologist Fath Reason for Consultation Atrial fibrillation  HPI: 61 year old female referred for atrial fibrillation. The patient has a history of atrial fibrillation on warfarin, recurrent respiratory failure, type 2 diabetes, morbid obesity, and CKD stage IV. The patient reports progressive shortness of breath for several days. EMS was called on 02/22/17, noted the patient to be in respiratory distress. En route, the patient coded and CPR was performed. At [facility] ER, the patient was intubated and regained spontaneous circulation. Upon arrival to the ICU, the patient coded again, CPR performed, and she regained circulation. The patient was extubated on 02/24/17. She had an episode of atrial fibrillation with RVR with rates 120-150 bpm this morning, and was started on amiodarone drip. She converted to sinus rhythm after about 20 minutes. Admission labs today are notable for creatinine 1.96, BUN 53, WBC 17.3, hemoglobin 8.3, hematocrit 26.9, INR 3.08, and serial troponin 0.04, 0.08, followed by 0.09. Chest x-ray today revealed bilateral pulmonary venous congestion and pulmonary infiltrates consistent with pulmonary edema, and small bilateral pleural effusions. Currently, the patient denies chest pain. She does have chest wall soreness from CPR; she denies shortness of breath or palpitations.   Review of systems: Sore throat, weakness    Past Medical History:  Diagnosis Date  . Anemia   . Anxiety   . Chronic kidney disease    INSUFFICIENCY  . COPD (chronic obstructive pulmonary disease) ([place])   . Diabetes mellitus without complication ([place])   . Dyspnea    DOE  . Dysrhythmia    A FIB  . Edema   . GERD (gastroesophageal reflux disease)   . History of kidney stones   .  Hypertension   . Hypothyroidism    ABLATION  . Neuropathy   . Orthopnea   . Sleep apnea    CPAP  . Vertigo   . Wheezing     Past Surgical History:  Procedure Laterality Date  . CATARACT EXTRACTION W/PHACO Left 01/26/2017   Procedure: CATARACT EXTRACTION PHACO AND INTRAOCULAR LENS PLACEMENT (IOC);  Surgeon: Estill Cotta, MD;  Location: [facility] ORS;  Service: Ophthalmology;  Laterality: Left;  [place]   1:02.2 AP     23.7 CDE   28.92 fluid pack lot# [id number] H exp.05/08/2018  . CHOLECYSTECTOMY    . TONSILLECTOMY      Prescriptions Prior to Admission  Medication Sig Dispense Refill Last Dose  . amLODipine-benazepril (LOTREL) 5-20 MG capsule Take 1 capsule by mouth daily.   unknown at Unknown time  . amoxicillin-clavulanate (AUGMENTIN) 875-125 MG tablet Take 1 tablet by mouth 2 (two) times daily. 6 tablet 0 unknown at Unknown time  . celecoxib (CELEBREX) 200 MG capsule Take 200 mg by mouth daily.   unknown at Unknown time  . citalopram (CELEXA) 20 MG tablet Take 20 mg by mouth daily.   unknown at Unknown time  . fluconazole (DIFLUCAN) 150 MG tablet Take 150 mg by mouth daily.   unknown at unknown  . furosemide (LASIX) 40 MG tablet Take 40 mg by mouth 2 (two) times daily.    unknown at Unknown time  . glimepiride (AMARYL) 2 MG tablet Take 2 mg by mouth daily with breakfast.   unknown at Unknown time  . insulin glargine (LANTUS) 100 UNIT/ML injection Inject 12 Units into the skin at bedtime.   unknown at  Unknown time  . insulin lispro (HUMALOG) 100 UNIT/ML injection Inject 40 Units into the skin 2 (two) times daily as needed for high blood sugar.   unknown at Unknown time  . levothyroxine (SYNTHROID, LEVOTHROID) 175 MCG tablet Take 175 mcg by mouth daily before breakfast.   unknown at Unknown time  . linagliptin (TRADJENTA) 5 MG TABS tablet Take 5 mg by mouth daily.   unknown at Unknown time  . lovastatin (MEVACOR) 20 MG tablet Take 20 mg by mouth at bedtime.   unknown at Unknown time  .  meclizine (ANTIVERT) 12.5 MG tablet Take 12.5 mg by mouth.   unknown at unknown  . metFORMIN (GLUCOPHAGE) 1000 MG tablet Take 1,000 mg by mouth.    unknown at unknown  . metoprolol succinate (TOPROL-XL) 100 MG 24 hr tablet Take 100 mg by mouth every morning.   unknown at Unknown time  . moxifloxacin (VIGAMOX) 0.5 % ophthalmic solution Place 1 drop into the left eye 4 (four) times daily. 3 mL 0 unknown at Unknown time  . nepafenac (ILEVRO) 0.3 % ophthalmic suspension Place 1 drop into the left eye daily. 1 Bottle 0 unknown at Unknown time  . nystatin (MYCOSTATIN) 100000 UNIT/ML suspension Take 5 mLs (500,000 Units total) by mouth 4 (four) times daily. 60 mL 0 unknown at Unknown time  . pantoprazole (PROTONIX) 40 MG tablet Take 40 mg by mouth 2 (two) times daily.   unknown at Unknown time  . warfarin (COUMADIN) 1 MG tablet Take 0.5 mg by mouth daily at 6 PM.   unknown at Unknown time  . warfarin (COUMADIN) 7.5 MG tablet Take 7.5 mg by mouth daily at 6 PM.   unknown at Unknown time  . Difluprednate 0.05 % EMUL Apply 1 drop to eye Nightly. 1 Bottle 0 unknown at unknown  . loratadine (CLARITIN) 10 MG tablet Take 10 mg by mouth daily.   unknown at unknown  . magnesium oxide (MAG-OX) 400 MG tablet Take 400 mg by mouth 2 (two) times daily.   unknown at unknown   Social History   Social History  . Marital status: Legally Separated    Spouse name: N/A  . Number of children: N/A  . Years of education: N/A   Occupational History  . Not on file.   Social History Main Topics  . Smoking status: Former Research scientist (life sciences)  . Smokeless tobacco: Former Systems developer  . Alcohol use No  . Drug use: Unknown  . Sexual activity: Not on file   Other Topics Concern  . Not on file   Social History Narrative  . No narrative on file    No family history on file.         PHYSICAL EXAM  General: In no acute distress, critically ill appearing HEENT:  Normocephalic and atramatic Neck:  No JVD.  Lungs: Normal effort of  breathing, no wheezing Heart: HRRR . Normal S1 and S2 without gallops or murmurs.  Abdomen: Bowel sounds are positive, abdomen soft  Msk:  Patient lying in bed Extremities: Trace bilateral lower extremity edema  Neuro: Alert and oriented X 3. Psych:  Flat affect, responds appropriately  Labs:   Lab Results  Component Value Date   WBC 17.3 (H) 02/25/2017   HGB 8.3 (L) 02/25/2017   HCT 26.9 (L) 02/25/2017   MCV 67.6 (L) 02/25/2017   PLT 233 02/25/2017    Recent Labs Lab 02/25/17 0501  NA 141  K 4.1  CL 100*  CO2 30  BUN  53*  CREATININE 1.96*  CALCIUM 8.6*  PROT 7.2  BILITOT 1.2  ALKPHOS 70  ALT 21  AST 23  GLUCOSE 175*   Lab Results  Component Value Date   TROPONINI 0.09 ([place]) 02/22/2017   No results found for: CHOL No results found for: HDL No results found for: [facility] Lab Results  Component Value Date   TRIG 68 02/22/2017   No results found for: CHOLHDL No results found for: LDLDIRECT    Radiology: Dg Chest 1 View  Result Date: 01/27/2017 CLINICAL DATA:  Initial evaluation for acute hypoxia. EXAM: CHEST 1 VIEW COMPARISON:  Prior radiograph from 01/26/2017. FINDINGS: Stable cardiomegaly.  Mediastinal silhouette within normal limits. Lungs normally inflated. Diffuse pulmonary vascular congestion with interstitial edema, improved from previous. No definite pleural effusion. Slight asymmetric opacity within the left lung may reflect edema and/ or infiltrate No pneumothorax. Osseous structures unchanged. IMPRESSION: 1. Stable cardiomegaly with persistent but improved pulmonary edema as compared 01/26/2017. 2. Persistent slight asymmetric opacity within the left lung as compared to the right. While this may be related to edema, underlying infiltrate could be considered in the correct clinical setting. Electronically Signed   By: Jeannine Boga M.D.   On: 01/27/2017 05:50   Dg Chest 2 View  Result Date: 01/27/2017 CLINICAL DATA:  Shortness of breath and pulmonary  edema EXAM: CHEST  2 VIEW COMPARISON:  01/27/2017 and prior exams FINDINGS: Cardiomegaly with pulmonary edema is unchanged. No definite pleural effusions noted. There is no evidence of pneumothorax or acute bony abnormality. IMPRESSION: No significant change in cardiomegaly and pulmonary edema. Electronically Signed   By: Margarette Canada M.D.   On: 01/27/2017 10:38   Dg Abd 1 View  Result Date: 02/24/2017 CLINICAL DATA:  NG tube placement EXAM: ABDOMEN - 1 VIEW COMPARISON:  02/23/2017 FINDINGS: The tip and side port of a gastric tube are noted in the right upper quadrant presumably in the distal stomach. The patient is slightly rotated on current exam. Cardiomegaly is noted with central line catheter tip projecting over the cavoatrial junction. IMPRESSION: 1. Gastric tube in the right upper quadrant suggesting a distal gastric position of the gastric tube tip. 2. Cardiomegaly with central line catheter in the expected location of the cavoatrial junction. Electronically Signed   By: Ashley Royalty M.D.   On: 02/24/2017 01:59   US Venous Img Lower Bilateral  Result Date: 02/23/2017 CLINICAL DATA:  Bilateral lower extremity edema. EXAM: BILATERAL LOWER EXTREMITY VENOUS DOPPLER ULTRASOUND TECHNIQUE: Gray-scale sonography with graded compression, as well as color Doppler and duplex ultrasound were performed to evaluate the lower extremity deep venous systems from the level of the common femoral vein and including the common femoral, femoral, profunda femoral, popliteal and calf veins including the posterior tibial, peroneal and gastrocnemius veins when visible. The superficial great saphenous vein was also interrogated. Spectral Doppler was utilized to evaluate flow at rest and with distal augmentation maneuvers in the common femoral, femoral and popliteal veins. COMPARISON:  Ultrasound 06/13/2007. FINDINGS: RIGHT LOWER EXTREMITY Common Femoral Vein: No evidence of thrombus. Normal compressibility, respiratory phasicity  and response to augmentation. Saphenofemoral Junction: No evidence of thrombus. Normal compressibility and flow on color Doppler imaging. Profunda Femoral Vein: No evidence of thrombus. Normal compressibility and flow on color Doppler imaging. Femoral Vein: No evidence of thrombus. Normal compressibility, respiratory phasicity and response to augmentation. Popliteal Vein: No evidence of thrombus. Normal compressibility, respiratory phasicity and response to augmentation. Calf Veins: No evidence of thrombus. Normal compressibility and  flow on color Doppler imaging. Limited visualization due to edema and patient clinical condition . Superficial Great Saphenous Vein: No evidence of thrombus. Normal compressibility and flow on color Doppler imaging. Other Findings:  None. LEFT LOWER EXTREMITY Common Femoral Vein: No evidence of thrombus. Normal compressibility, respiratory phasicity and response to augmentation. Saphenofemoral Junction: No evidence of thrombus. Normal compressibility and flow on color Doppler imaging. Profunda Femoral Vein: No evidence of thrombus. Normal compressibility and flow on color Doppler imaging. Femoral Vein: No evidence of thrombus. Normal compressibility, respiratory phasicity and response to augmentation. Popliteal Vein: No evidence of thrombus. Normal compressibility, respiratory phasicity and response to augmentation. Calf Veins: No evidence of thrombus. Normal compressibility and flow on color Doppler imaging. Limited visualization due to edema and patient clinical condition . Superficial Great Saphenous Vein: No evidence of thrombus. Normal compressibility and flow on color Doppler imaging. Other Findings:  None. IMPRESSION: No evidence of DVT within either lower extremity. Electronically Signed   By: Marcello Moores  Register   On: 02/23/2017 12:51   Dg Chest Port 1 View  Result Date: 02/25/2017 CLINICAL DATA:  Respiratory failure. EXAM: PORTABLE CHEST 1 VIEW COMPARISON:  02/23/2017.  FINDINGS: Interim extubation removal of NG tube. Right IJ line noted with tip projected over the right atrium. Cardiomegaly with pulmonary venous congestion and bilateral pulmonary infiltrates consistent pulmonary edema. Small bilateral pleural effusions. IMPRESSION: 1. Interim extubation and removal of NG tube. Right IJ line noted with tip projected over the right atrium. 2. Cardiomegaly with bilateral pulmonary venous congestion and pulmonary infiltrates consistent pulmonary edema. Small bilateral pleural effusions. No change from prior exam. Electronically Signed   By: Marcello Moores  Register   On: 02/25/2017 06:48   Dg Chest Port 1 View  Result Date: 02/24/2017 CLINICAL DATA:  Respiratory failure. EXAM: PORTABLE CHEST 1 VIEW COMPARISON:  02/23/2017. FINDINGS: Endotracheal tube, NG tube, right IJ line in stable position. Cardiomegaly with bilateral pulmonary infiltrates/edema and small bilateral pleural effusions. Findings most consistent with congestive heart failure. Slight worsening from prior exam . IMPRESSION: 1. Lines and tubes in stable position. 2. Congestive heart failure pulmonary edema. Slight worsening of pulmonary edema from prior exams. Small bilateral pleural effusions again noted. Electronically Signed   By: Marcello Moores  Register   On: 02/24/2017 07:39   Dg Chest Port 1 View  Result Date: 02/23/2017 CLINICAL DATA:  Acute respiratory failure. EXAM: PORTABLE CHEST 1 VIEW COMPARISON:  02/23/2017 chest radiograph. FINDINGS: Right rotated chest radiograph. Endotracheal tube tip is 2.1 cm above the carina. Right internal jugular central venous catheter terminates at the cavoatrial junction. Enteric tube enters the lower thoracic esophagus, with the tip not seen on this image. Stable cardiomediastinal silhouette with mild cardiomegaly. No pneumothorax. Small bilateral pleural effusions. Mild-to-moderate pulmonary edema. Bibasilar atelectasis. IMPRESSION: 1. Well-positioned endotracheal tube. Enteric tube  enters the lower thoracic esophagus with the tip not seen on this image. 2. Mild-to-moderate congestive heart failure. 3. Small bilateral pleural effusions with bibasilar atelectasis. Electronically Signed   By: Ilona Sorrel M.D.   On: 02/23/2017 19:56   Dg Chest Port 1 View  Result Date: 02/23/2017 CLINICAL DATA:  Shortness of breath. EXAM: PORTABLE CHEST 1 VIEW COMPARISON:  02/22/2017. FINDINGS: Surgical clips right chest. Endotracheal tube and right IJ line stable position. NG tube tip remains projected over the lower esophagus, advancement of approximately 20 cm should be considered. Patchy infiltrates in the right lung have improved. Diffuse left lung infiltrate remains. Basilar atelectasis. No prominent pleural effusion. No pneumothorax. Stable cardiomegaly .  IMPRESSION: 1. NG tube tip again noted over the lower esophagus. Advancement of approximately 20 cm should be considered. Endotracheal tube and right IJ line stable position. 2. Partial clearing of patchy infiltrates right lung. Diffuse left lung infiltrate remains. Low lung volumes. 3. Stable cardiomegaly. Critical Value/emergent results were called by telephone at the time of interpretation on 02/23/2017 at 6:44 am to nurse Pamala Hurry , who verbally acknowledged these results. Electronically Signed   By: Marcello Moores  Register   On: 02/23/2017 06:48   Dg Chest Port 1 View  Result Date: 02/22/2017 CLINICAL DATA:  61 year old female post CPR.  Subsequent encounter. EXAM: PORTABLE CHEST 1 VIEW COMPARISON:  02/22/2017 8:52 a.m. FINDINGS: Endotracheal tube tip 3.6 cm above the carina. Endotracheal tube tip mid to distal esophagus level with side hole proximal to mid esophagus level. This needs to be reposition. Right internal jugular catheter tip mid superior vena cava level. Cardiomegaly. Patchy consolidation most notable right upper lobe, left midlung zone and right perihilar region. Question multifocal pneumonia versus asymmetric pulmonary edema. No gross  pneumothorax.  Evaluation limited by overlying structures. No obvious rib fracture. IMPRESSION: Endotracheal tube tip 3.6 cm above the carina. Endotracheal tube tip mid to distal esophagus level with side hole proximal to mid esophagus level. This needs to be reposition. Right internal jugular catheter tip mid superior vena cava level. Cardiomegaly. Patchy consolidation most notable right upper lobe, left midlung zone and right perihilar region. Question multifocal pneumonia versus asymmetric pulmonary edema. No gross pneumothorax.  Evaluation limited by overlying structures. Electronically Signed   By: Genia Del M.D.   On: 02/22/2017 10:52   Dg Chest Portable 1 View  Result Date: 02/22/2017 CLINICAL DATA:  Hypoxia EXAM: PORTABLE CHEST 1 VIEW COMPARISON:  Study obtained earlier in the day. FINDINGS: Endotracheal tube tip is 3.2 cm above the carina. No pneumothorax. There is interstitial pulmonary edema with bibasilar atelectasis. No airspace consolidation. There is cardiomegaly with pulmonary venous hypertension. There is aortic atherosclerosis. No adenopathy. No bone lesions. IMPRESSION: Endotracheal tube as described without pneumothorax. Changes of congestive heart failure. No consolidation. There is aortic atherosclerosis. Aortic Atherosclerosis (ICD10-I70.0). Electronically Signed   By: Lowella Grip III M.D.   On: 02/22/2017 09:06   Dg Chest Portable 1 View  Result Date: 02/22/2017 CLINICAL DATA:  Endotracheal tube placement EXAM: PORTABLE CHEST 1 VIEW COMPARISON:  01/27/2017 FINDINGS: Endotracheal tube in good position.  NG tip not well seen. Interval worsening of diffuse bilateral airspace disease most prominent right upper lobe. Possible edema versus pneumonia. No pleural effusion. Cardiac enlargement IMPRESSION: Endotracheal tube in good position. Significant worsening in bilateral airspace disease which may represent pneumonia or edema. Electronically Signed   By: Franchot Gallo M.D.   On:  02/22/2017 08:16   Dg Chest Port 1 View  Result Date: 01/26/2017 CLINICAL DATA:  Shortness breath.  History of diabetes and edema. EXAM: PORTABLE CHEST 1 VIEW COMPARISON:  Chest radiograph January 08, 2016 FINDINGS: Cardiac silhouette is mildly enlarged and unchanged. Calcified aortic knob. Interstitial and predominately LEFT lung alveolar airspace opacities. No pleural effusion. No pneumothorax. Bronchitic changes. Soft tissue planes and included osseous structures are nonsuspicious. Large body habitus. IMPRESSION: Interstitial and alveolar airspace opacities can be seen with pulmonary edema or infection. Stable cardiomegaly. Electronically Signed   By: Elon Alas M.D.   On: 01/26/2017 13:34   Dg Abd Portable 1v  Result Date: 02/23/2017 CLINICAL DATA:  Nasogastric tube placement EXAM: PORTABLE ABDOMEN - 1 VIEW COMPARISON:  Yesterday FINDINGS: Nasogastric  tube tip overlaps the distal stomach. Central line with tip at the upper cavoatrial junction. Chest x-ray was obtained earlier today. Stable bowel gas pattern with mild gaseous distention of right colon and distal stomach. Cholecystectomy clips. Artifact from defibrillator pads. IMPRESSION: Nasogastric tube tip overlaps the  distal stomach. Electronically Signed   By: Monte Fantasia M.D.   On: 02/23/2017 09:25   Dg Abd Portable 1v  Result Date: 02/22/2017 CLINICAL DATA:  Orogastric tube placement EXAM: PORTABLE ABDOMEN - 1 VIEW COMPARISON:  None. FINDINGS: No appreciable orogastric tube. There is mild dilatation in the transverse colon region. No small bowel dilatation. No air-fluid level. No free air evident. There are surgical clips the right upper quadrant. IMPRESSION: No orogastric tube evident. Question a degree of colonic ileus. There does not appear to be bowel obstruction or free air. Electronically Signed   By: Lowella Grip III M.D.   On: 02/22/2017 10:51    EKG: NSR, rate 87 bpm  ASSESSMENT AND PLAN:  1. Cardiopulmonary arrest x  2, while in route to hospital and while on the unit, now extubated 2. Atrial fibrillation, with a history of such, anticoagulated with warfarin, converted to sinus rhythm with amiodarone drip 3. Recurrent respiratory distress 4. Type 2 diabetes 5. CKD, IV  Recommendations: 1. Continue current therapy. 2. Continue amiodarone drip for now 3. Continue warfarin for stroke prevention 4. 2D echocardiogram pending; review results  Signed: Clabe Seal, PA-C 02/25/2017, 12:45 PM

## 2017-02-25 NOTE — Care Management Note (Signed)
Case Management Note  Patient Details  Name: Andrea Rivers MRN: [id number] Date of Birth: 12-31-1955  Subjective/Objective:                   Patient now alert and extubated in ICU bed 1. This RNCM met again with husband and patient to discuss RNCM role in progression of care. Patient at baseline, is able to ambulate independently and able to drive. Supplemental O2 is acute.  Action/Plan: Home health list along with my contact card left with husband on previous visit. RNCM to continue to follow.   Expected Discharge Date:                  Expected Discharge Plan:     In-House Referral:     Discharge planning Services  CM Consult  Post Acute Care Choice:  Home Health, Durable Medical Equipment Choice offered to:  Patient, Spouse, Sibling  DME Arranged:    DME Agency:     HH Arranged:    [place] Agency:     Status of Service:  In process, will continue to follow  If discussed at Long Length of Stay Meetings, dates discussed:    Additional Comments:  Marshell Garfinkel, RN 02/25/2017, 9:43 AM

## 2017-02-25 NOTE — Progress Notes (Signed)
Pt has converted back over to a-fib and HR is elevated between 120s and 140s. Dr. Saralyn Pilar notified and per his order will administer 25mg  of metoprolol Q 6 hours.

## 2017-02-25 NOTE — Progress Notes (Signed)
Patient had a bp of 168/61 (100), bp was retook 167/85 (107). Np, [place] called and notified. NP is okay with systolic up to 325.

## 2017-02-26 LAB — PROTIME-INR
INR: 1.44
PROTHROMBIN TIME: 17.7 s — AB (ref 11.4–15.2)

## 2017-02-26 LAB — GLUCOSE, CAPILLARY
GLUCOSE-CAPILLARY: 183 mg/dL — AB (ref 65–99)
GLUCOSE-CAPILLARY: 187 mg/dL — AB (ref 65–99)
GLUCOSE-CAPILLARY: 166 mg/dL — AB (ref 65–99)
Glucose-Capillary: 169 mg/dL — ABNORMAL HIGH (ref 65–99)
Glucose-Capillary: 169 mg/dL — ABNORMAL HIGH (ref 65–99)

## 2017-02-26 LAB — CBC WITH DIFFERENTIAL/PLATELET
Basophils Absolute: 0.1 10*3/uL (ref 0–0.1)
Basophils Relative: 1 %
EOS ABS: 0.1 10*3/uL (ref 0–0.7)
EOS PCT: 1 %
HCT: 28.2 % — ABNORMAL LOW (ref 35.0–47.0)
HEMOGLOBIN: 8.6 g/dL — AB (ref 12.0–16.0)
LYMPHS ABS: 0.8 10*3/uL — AB (ref 1.0–3.6)
Lymphocytes Relative: 6 %
MCH: 21.2 pg — AB (ref 26.0–34.0)
MCHC: 30.3 g/dL — AB (ref 32.0–36.0)
MCV: 70.1 fL — ABNORMAL LOW (ref 80.0–100.0)
MONO ABS: 1.3 10*3/uL — AB (ref 0.2–0.9)
Monocytes Relative: 9 %
NEUTROS PCT: 83 %
Neutro Abs: 11.9 10*3/uL — ABNORMAL HIGH (ref 1.4–6.5)
Platelets: 232 10*3/uL (ref 150–440)
RBC: 4.03 MIL/uL (ref 3.80–5.20)
RDW: 20.4 % — AB (ref 11.5–14.5)
WBC: 14.2 10*3/uL — ABNORMAL HIGH (ref 3.6–11.0)

## 2017-02-26 LAB — BASIC METABOLIC PANEL
Anion gap: 9 (ref 5–15)
BUN: 41 mg/dL — AB (ref 6–20)
CALCIUM: 9.1 mg/dL (ref 8.9–10.3)
CHLORIDE: 100 mmol/L — AB (ref 101–111)
CO2: 32 mmol/L (ref 22–32)
CREATININE: 1.57 mg/dL — AB (ref 0.44–1.00)
GFR calc Af Amer: 40 mL/min — ABNORMAL LOW (ref >60)
GFR calc non Af Amer: 35 mL/min — ABNORMAL LOW (ref >60)
Glucose, Bld: 185 mg/dL — ABNORMAL HIGH (ref 65–99)
Potassium: 4.6 mmol/L (ref 3.5–5.1)
SODIUM: 141 mmol/L (ref 135–145)

## 2017-02-26 LAB — LEGIONELLA PNEUMOPHILA SEROGP 1 UR AG: L. pneumophila Serogp 1 Ur Ag: NEGATIVE

## 2017-02-26 LAB — MAGNESIUM: Magnesium: 2.2 mg/dL (ref 1.7–2.4)

## 2017-02-26 MED ORDER — WARFARIN - PHARMACIST DOSING INPATIENT
Freq: Every day | Status: DC
  Administered 2017-02-27 – 2017-03-02 (×2)

## 2017-02-26 MED ORDER — CITALOPRAM HYDROBROMIDE 20 MG PO TABS
20.0000 mg | ORAL_TABLET | Freq: Every day | ORAL | Status: DC
  Administered 2017-02-26 – 2017-03-03 (×6): 20 mg via ORAL
  Filled 2017-02-26 (×6): qty 1

## 2017-02-26 MED ORDER — WARFARIN SODIUM 2 MG PO TABS
2.0000 mg | ORAL_TABLET | ORAL | Status: AC
  Administered 2017-02-26: 2 mg via ORAL
  Filled 2017-02-26: qty 1

## 2017-02-26 MED ORDER — WARFARIN SODIUM 4 MG PO TABS: 4.0000 mg | ORAL_TABLET | Freq: Every day | ORAL | Status: DC

## 2017-02-26 MED ORDER — AMIODARONE HCL 200 MG PO TABS
400.0000 mg | ORAL_TABLET | Freq: Two times a day (BID) | ORAL | Status: DC
  Administered 2017-02-26 – 2017-02-27 (×3): 400 mg via ORAL
  Filled 2017-02-26 (×3): qty 2

## 2017-02-26 MED ORDER — FUROSEMIDE 10 MG/ML IJ SOLN
40.0000 mg | Freq: Once | INTRAMUSCULAR | Status: AC
  Administered 2017-02-26: 40 mg via INTRAVENOUS
  Filled 2017-02-26: qty 4

## 2017-02-26 MED ORDER — WARFARIN SODIUM 2 MG PO TABS
2.0000 mg | ORAL_TABLET | Freq: Once | ORAL | Status: AC
  Administered 2017-02-26: 2 mg via ORAL
  Filled 2017-02-26: qty 1

## 2017-02-26 NOTE — Progress Notes (Signed)
[facility] Cardiology  SUBJECTIVE: The patient currently denies chest pain or shortness of breath   Vitals:   02/26/17 0600 02/26/17 0700 02/26/17 0748 02/26/17 0800  BP: (!) 149/75 (!) 149/67  140/65  Pulse: 70 69  69  Resp: (!) 24 16  (!) 22  Temp: 99 F (37.2 C)  98.1 F (36.7 C)   TempSrc:   Oral   SpO2: 95% 99%  100%  Weight:      Height:         Intake/Output Summary (Last 24 hours) at 02/26/17 1208 Last data filed at 02/26/17 0700  Gross per 24 hour  Intake            468.1 ml  Output             1900 ml  Net          -1431.9 ml      PHYSICAL EXAM  General: Well developed, well nourished, in no acute distress HEENT:  Normocephalic and atramatic Neck:  No JVD.  Lungs: Clear bilaterally to auscultation and percussion. Heart: HRRR . Normal S1 and S2 without gallops or murmurs.  Abdomen: Bowel sounds are positive, abdomen soft and non-tender  Msk:  Back normal, normal gait. Normal strength and tone for age. Extremities: No clubbing, cyanosis or edema.   Neuro: Alert and oriented X 3. Psych:  Good affect, responds appropriately   LABS: Basic Metabolic Panel:  Recent Labs  02/25/17 0501 02/26/17 0827  NA 141 141  K 4.1 4.6  CL 100* 100*  CO2 30 32  GLUCOSE 175* 185*  BUN 53* 41*  CREATININE 1.96* 1.57*  CALCIUM 8.6* 9.1  MG  --  2.2   Liver Function Tests:  Recent Labs  02/24/17 0403 02/25/17 0501  AST 29 23  ALT 24 21  ALKPHOS 58 70  BILITOT 0.8 1.2  PROT 6.4* 7.2  ALBUMIN 3.0* 3.0*   No results for input(s): LIPASE, AMYLASE in the last 72 hours. CBC:  Recent Labs  02/25/17 0501 02/26/17 0827  WBC 17.3* 14.2*  NEUTROABS  --  11.9*  HGB 8.3* 8.6*  HCT 26.9* 28.2*  MCV 67.6* 70.1*  PLT 233 232   Cardiac Enzymes: No results for input(s): CKTOTAL, CKMB, CKMBINDEX, TROPONINI in the last 72 hours. BNP: Invalid input(s): POCBNP D-Dimer: No results for input(s): DDIMER in the last 72 hours. Hemoglobin A1C: No results for input(s):  HGBA1C in the last 72 hours. Fasting Lipid Panel: No results for input(s): CHOL, HDL, LDLCALC, TRIG, CHOLHDL, LDLDIRECT in the last 72 hours. Thyroid Function Tests: No results for input(s): TSH, T4TOTAL, T3FREE, THYROIDAB in the last 72 hours.  Invalid input(s): FREET3 Anemia Panel: No results for input(s): VITAMINB12, FOLATE, FERRITIN, TIBC, IRON, RETICCTPCT in the last 72 hours.  Dg Chest Port 1 View  Result Date: 02/25/2017 CLINICAL DATA:  Respiratory failure. EXAM: PORTABLE CHEST 1 VIEW COMPARISON:  02/23/2017. FINDINGS: Interim extubation removal of NG tube. Right IJ line noted with tip projected over the right atrium. Cardiomegaly with pulmonary venous congestion and bilateral pulmonary infiltrates consistent pulmonary edema. Small bilateral pleural effusions. IMPRESSION: 1. Interim extubation and removal of NG tube. Right IJ line noted with tip projected over the right atrium. 2. Cardiomegaly with bilateral pulmonary venous congestion and pulmonary infiltrates consistent pulmonary edema. Small bilateral pleural effusions. No change from prior exam. Electronically Signed   By: Marcello Moores  Register   On: 02/25/2017 06:48     Echo LV EF 50-55%  TELEMETRY: Sinus  rhythm:  ASSESSMENT AND PLAN:  Active Problems:   Acute respiratory failure ([place])   Shock ([place])    1. Paroxysmal atrial fibrillation, on warfarin for stroke prevention, converted to sinus rhythm 2. Respiratory failure, stabilized 3. CKD stage IV  Recommendations  1. Agree with overall current therapy 2. Transition IV to by mouth amiodarone 3. Continue warfarin for stroke prevention   Isaias Cowman, MD, PhD, [facility] 02/26/2017 12:08 PM

## 2017-02-26 NOTE — Progress Notes (Signed)
At this time patient does not need ICU Respiratory failure has resolved  Patient still needs cardiac assessment  Vital signs reviewed, ICU needs resolved, and no active acute Pulmonary issues at this time.   Will sign off at this time. No further recommendations at this time.  Please call [phone] for further questions. Thank you.    Corrin Parker, M.D.  [facility]  Medical Director [place] Director [facility] Cardio-Pulmonary Department

## 2017-02-26 NOTE — Progress Notes (Signed)
[facility]  ROUNDING NOTE   Subjective:  Renal function continues to improve. Creatinine down to 1.5. Urine output was 2.6 L over the preceding 24 hours.  Objective:  Vital signs in last 24 hours:  Temp:  [98.1 F (36.7 C)-100.8 F (38.2 C)] 98.1 F (36.7 C) (07/21 0748) Pulse Rate:  [54-124] 69 (07/21 0800) Resp:  [14-29] 22 (07/21 0800) BP: (90-150)/(53-106) 140/65 (07/21 0800) SpO2:  [93 %-100 %] 100 % (07/21 0800)  Weight change:  Filed Weights   02/22/17 0813  Weight: (!) 146.1 kg (322 lb)    Intake/Output: I/O last 3 completed shifts: In: 468.1 [I.V.:468.1] Out: 0677 [Urine:5530]   Intake/Output this shift:  No intake/output data recorded.  Physical Exam: General: Resting in bed  Head: [place]/AT hearing intact OM moist  Eyes: Anicteric  Neck: Supple, trachea midline  Lungs:  Bilateral rhonchi, breath sounds vent assisted   Heart: S1S2 no rubs  Abdomen:  Soft, nontender, bowel sounds present  Extremities: 1+ peripheral edema.  Neurologic: Awake, alert, follows commands  Skin: No lesions       Basic Metabolic Panel:  Recent Labs Lab 02/22/17 1706  02/23/17 0445 02/23/17 0826 02/23/17 1730 02/24/17 0403 02/25/17 0501 02/26/17 0827  NA 137  < > 138  --  138 141 141 141  K 5.9*  < > 5.3* 5.3* 4.8 4.7 4.1 4.6  CL 105  < > 103  --  102 102 100* 100*  CO2 23  < > 25  --  '27 29 30 ' 32  GLUCOSE 422*  < > 316*  --  116* 115* 175* 185*  BUN 49*  < > 56*  --  59* 66* 53* 41*  CREATININE 2.32*  < > 2.50*  --  2.75* 2.77* 1.96* 1.57*  CALCIUM 7.7*  < > 7.5*  --  7.6* 7.7* 8.6* 9.1  MG 2.2  --   --   --   --   --   --  2.2  < > = values in this interval not displayed.  Liver Function Tests:  Recent Labs Lab 02/22/17 0755 02/24/17 0403 02/25/17 0501  AST 38 29 23  ALT '17 24 21  ' ALKPHOS 87 58 70  BILITOT 0.5 0.8 1.2  PROT 7.8 6.4* 7.2  ALBUMIN 3.5 3.0* 3.0*   No results for input(s): LIPASE, AMYLASE in the last 168 hours. No results  for input(s): AMMONIA in the last 168 hours.  CBC:  Recent Labs Lab 02/22/17 0755 02/22/17 1040 02/23/17 0445 02/24/17 0403 02/24/17 1702 02/25/17 0501 02/26/17 0827  WBC 16.1* 34.1* 20.7* 17.5*  --  17.3* 14.2*  NEUTROABS 11.6*  --   --   --   --   --  11.9*  HGB 8.6* 8.3* 7.6* 6.5* 7.8* 8.3* 8.6*  HCT 30.2* 29.4* 25.8* 22.4* 25.7* 26.9* 28.2*  MCV 72.9* 71.5* 70.0* 67.9*  --  67.6* 70.1*  PLT 373 377 288 228  --  233 232    Cardiac Enzymes:  Recent Labs Lab 02/22/17 0755 02/22/17 1040 02/22/17 1706 02/22/17 2243  TROPONINI <0.03 0.04* 0.08* 0.09*    BNP: Invalid input(s): POCBNP  CBG:  Recent Labs Lab 02/25/17 1603 02/25/17 1948 02/25/17 2343 02/26/17 0354 02/26/17 0739  GLUCAP 188* 209* 223* 183* 169*    Microbiology: Results for orders placed or performed during the hospital encounter of 02/22/17  Blood Culture (routine x 2)     Status: None (Preliminary result)   Collection Time: 02/22/17  7:55 AM  Result Value Ref Range Status   Specimen Description BLOOD RIGHT AV  Final   Special Requests   Final    BOTTLES DRAWN AEROBIC AND ANAEROBIC Blood Culture results may not be optimal due to an excessive volume of blood received in culture bottles   Culture NO GROWTH 4 DAYS  Final   Report Status PENDING  Incomplete  Blood Culture (routine x 2)     Status: None (Preliminary result)   Collection Time: 02/22/17  8:15 AM  Result Value Ref Range Status   Specimen Description BLOOD RIGHT HAND  Final   Special Requests   Final    BOTTLES DRAWN AEROBIC AND ANAEROBIC Blood Culture results may not be optimal due to an excessive volume of blood received in culture bottles   Culture NO GROWTH 4 DAYS  Final   Report Status PENDING  Incomplete  Culture, respiratory (NON-Expectorated)     Status: None   Collection Time: 02/22/17 10:00 AM  Result Value Ref Range Status   Specimen Description TRACHEAL ASPIRATE  Final   Special Requests Normal  Final   Gram Stain    Final    FEW WBC PRESENT,BOTH PMN AND MONONUCLEAR NO ORGANISMS SEEN    Culture   Final    Consistent with normal respiratory flora. Performed at [facility], [place] [address] [place], [place] [address]    Report Status 02/25/2017 FINAL  Final  MRSA PCR Screening     Status: None   Collection Time: 02/22/17 10:50 AM  Result Value Ref Range Status   MRSA by PCR NEGATIVE NEGATIVE Final    Comment:        The GeneXpert MRSA Assay (FDA approved for NASAL specimens only), is one component of a comprehensive MRSA colonization surveillance program. It is not intended to diagnose MRSA infection nor to guide or monitor treatment for MRSA infections.   Urine culture     Status: None   Collection Time: 02/22/17 11:26 AM  Result Value Ref Range Status   Specimen Description URINE, CATHETERIZED  Final   Special Requests NONE  Final   Culture   Final    NO GROWTH Performed at [facility], [address], [place], [place] [address]    Report Status 02/24/2017 FINAL  Final    Coagulation Studies:  Recent Labs  02/24/17 0403 02/25/17 0501 02/26/17 0827  LABPROT 63.6* 32.5* 17.7*  INR 7.14* 3.08 1.44    Urinalysis: No results for input(s): COLORURINE, LABSPEC, PHURINE, GLUCOSEU, HGBUR, BILIRUBINUR, KETONESUR, PROTEINUR, UROBILINOGEN, NITRITE, LEUKOCYTESUR in the last 72 hours.  Invalid input(s): APPERANCEUR    Imaging: Dg Chest Port 1 View  Result Date: 02/25/2017 CLINICAL DATA:  Respiratory failure. EXAM: PORTABLE CHEST 1 VIEW COMPARISON:  02/23/2017. FINDINGS: Interim extubation removal of NG tube. Right IJ line noted with tip projected over the right atrium. Cardiomegaly with pulmonary venous congestion and bilateral pulmonary infiltrates consistent pulmonary edema. Small bilateral pleural effusions. IMPRESSION: 1. Interim extubation and removal of NG tube. Right IJ line noted with tip projected over the right atrium. 2. Cardiomegaly with bilateral pulmonary  venous congestion and pulmonary infiltrates consistent pulmonary edema. Small bilateral pleural effusions. No change from prior exam. Electronically Signed   By: Marcello Moores  Register   On: 02/25/2017 06:48     Medications:   . amiodarone 30 mg/hr (02/26/17 0318)  . norepinephrine (LEVOPHED) Adult infusion Stopped (02/24/17 0904)   . amiodarone  150 mg Intravenous Once  . chlorhexidine  gluconate (MEDLINE KIT)  15 mL Mouth Rinse BID  . insulin aspart  0-15 Units Subcutaneous Q4H  . insulin glargine  20 Units Subcutaneous QHS  . mouth rinse  15 mL Mouth Rinse BID  . metoprolol tartrate  25 mg Oral Q6H   acetaminophen **OR** [DISCONTINUED] acetaminophen, [DISCONTINUED] ondansetron **OR** ondansetron (ZOFRAN) IV, oxyCODONE, sodium chloride flush  Assessment/ Plan:  61 y.o. female with past medical history of hypothyroidism, atrial fibrillation, diabetes mellitus type 2, hypertension, obesity, GERD, osteoporosis, asthma, allergic rhinitis, chronic back pain, hyperlipidemia, B12 deficiency, and obstructive sleep apnea who was admitted with PEA arrest.    1. Acute renal failure/Chronic kidney disease stage IV/proteinuria/diabetes mellitus type 2 with chronic kidney disease/history of NSAID use.   - Patient significantly improved. Creatinine down to 1.57. Good urine output noted. Creatinine currently under a sliding. Continue to monitor renal function daily.  2. Hyperkalemia. Appears to have resolved. Serum potassium currently 4.6.  3. Acute respiratory failure/PEA arrest. Extubated 02/24/17.  Patient continues to do well post extubation.   LOS: 4 Banita Lehn 7/21/20189:10 AM

## 2017-02-26 NOTE — Progress Notes (Signed)
[place] at [facility]                                                                                                                                                                                  Patient Demographics   Rivkah Wolz, is a 61 y.o. female, DOB - [birth year], EXB:[id number]  Admit date - 02/22/2017   Admitting Physician Dustin Flock, MD  Outpatient Primary MD for the patient is Gauger, Victoriano Lain, NP   LOS - 4  Subjective: The patient remains in sinus rhythm after amiodarone intravenously was initiated, admits of shortness of breath,  now on 5 L of oxygen.   Review of Systems:     CONSTITUTIONAL: No documented fever. No fatigue, weakness. No weight gain, no weight loss.  EYES: No blurry or double vision.  ENT: No tinnitus. No postnasal drip. No redness of the oropharynx.  RESPIRATORY: No cough, no wheeze, no hemoptysis. No dyspnea.  CARDIOVASCULAR: No chest pain. No orthopnea. No palpitations. No syncope.  GASTROINTESTINAL: No nausea, no vomiting or diarrhea. No abdominal pain. No melena or hematochezia.  GENITOURINARY:  No urgency. No frequency. No dysuria. No hematuria. No obstructive symptoms. No discharge. No pain. No significant abnormal bleeding ENDOCRINE: No polyuria or nocturia. No heat or cold intolerance.  HEMATOLOGY: No anemia. No bruising. No bleeding. No purpura. No petechiae INTEGUMENTARY: No rashes. No lesions.  MUSCULOSKELETAL: Complains of pain in the right arm has a history of bursitis NEUROLOGIC: No numbness, tingling, or ataxia. No seizure-type activity.  PSYCHIATRIC: No anxiety. No insomnia. No ADD.       Vitals:   Vitals:   02/26/17 0700 02/26/17 0748 02/26/17 0800 02/26/17 1330  BP: (!) 149/67  140/65 (!) 132/47  Pulse: 69  69 71  Resp: 16  (!) 22 20  Temp:  98.1 F (36.7 C)  98 F (36.7 C)  TempSrc:  Oral  Oral  SpO2: 99%  100% 96%  Weight:      Height:        Wt Readings from Last 3  Encounters:  02/22/17 (!) 146.1 kg (322 lb)  01/26/17 135.8 kg (299 lb 6.2 oz)     Intake/Output Summary (Last 24 hours) at 02/26/17 1537 Last data filed at 02/26/17 0700  Gross per 24 hour  Intake            274.4 ml  Output             1175 ml  Net           -900.6 ml    Physical Exam:   GENERAL: Critically ill-appearing, Obese HEAD, EYES, EARS, NOSE AND THROAT: Atraumatic, normocephalic.Marland Kitchen  Pupils equal and reactive to light. Sclerae anicteric. No conjunctival injection. No oro-pharyngeal erythema.  NECK: Supple. There is no jugular venous distention. No bruits, no lymphadenopathy, no thyromegaly.  HEART: Regularly irregular No murmurs, no rubs, no clicks.  LUNGS: Rhonchus breath sounds bilaterally  ABDOMEN: Soft, flat, nontender, nondistended. Has good bowel sounds. No hepatosplenomegaly appreciated.  EXTREMITIES: No evidence of any cyanosis, clubbing, Trace peripheral edema.  +2 pedal and radial pulses bilaterally.  NEUROLOGIC: Sedated SKIN: Moist and warm with no rashes appreciated.  Psych: Sedated  LN: No inguinal LN enlargement    Antibiotics   Anti-infectives    Start     Dose/Rate Route Frequency Ordered Stop   02/22/17 1500  ceFEPIme (MAXIPIME) 2 g in dextrose 5 % 50 mL IVPB  Status:  Discontinued     2 g 100 mL/hr over 30 Minutes Intravenous Every 12 hours 02/22/17 1127 02/24/17 1111   02/22/17 1400  vancomycin (VANCOCIN) 1,250 mg in sodium chloride 0.9 % 250 mL IVPB  Status:  Discontinued     1,250 mg 166.7 mL/hr over 90 Minutes Intravenous Every 18 hours 02/22/17 0842 02/23/17 0839   02/22/17 1200  azithromycin (ZITHROMAX) 500 mg in dextrose 5 % 250 mL IVPB  Status:  Discontinued     500 mg 250 mL/hr over 60 Minutes Intravenous Every 24 hours 02/22/17 1113 02/25/17 1054   02/22/17 0845  piperacillin-tazobactam (ZOSYN) IVPB 4.5 g  Status:  Discontinued     4.5 g 200 mL/hr over 30 Minutes Intravenous Every 8 hours 02/22/17 0842 02/22/17 1113   02/22/17 0830   vancomycin (VANCOCIN) IVPB 1000 mg/200 mL premix     1,000 mg 200 mL/hr over 60 Minutes Intravenous  Once 02/22/17 0823 02/22/17 1030   02/22/17 0815  vancomycin (VANCOCIN) injection 2,000 mg  Status:  Discontinued     2,000 mg Intravenous  Once 02/22/17 0804 02/22/17 0823   02/22/17 0815  piperacillin-tazobactam (ZOSYN) IVPB 3.375 g     3.375 g 100 mL/hr over 30 Minutes Intravenous  Once 02/22/17 0804 02/22/17 0925      Medications   Scheduled Meds: . amiodarone  400 mg Oral BID  . chlorhexidine gluconate (MEDLINE KIT)  15 mL Mouth Rinse BID  . insulin aspart  0-15 Units Subcutaneous Q4H  . insulin glargine  20 Units Subcutaneous QHS  . mouth rinse  15 mL Mouth Rinse BID  . metoprolol tartrate  25 mg Oral Q6H  . warfarin  2 mg Oral ONCE-1800  . [START ON 02/27/2017] warfarin  4 mg Oral q1800  . Warfarin - Pharmacist Dosing Inpatient   Does not apply q1800   Continuous Infusions: . norepinephrine (LEVOPHED) Adult infusion Stopped (02/24/17 0904)   PRN Meds:.acetaminophen **OR** [DISCONTINUED] acetaminophen, [DISCONTINUED] ondansetron **OR** ondansetron (ZOFRAN) IV, oxyCODONE, sodium chloride flush   Data Review:   Micro Results Recent Results (from the past 240 hour(s))  Blood Culture (routine x 2)     Status: None (Preliminary result)   Collection Time: 02/22/17  7:55 AM  Result Value Ref Range Status   Specimen Description BLOOD RIGHT AV  Final   Special Requests   Final    BOTTLES DRAWN AEROBIC AND ANAEROBIC Blood Culture results may not be optimal due to an excessive volume of blood received in culture bottles   Culture NO GROWTH 4 DAYS  Final   Report Status PENDING  Incomplete  Blood Culture (routine x 2)     Status: None (Preliminary result)   Collection Time: 02/22/17  8:15 AM  Result Value Ref Range Status   Specimen Description BLOOD RIGHT HAND  Final   Special Requests   Final    BOTTLES DRAWN AEROBIC AND ANAEROBIC Blood Culture results may not be optimal due  to an excessive volume of blood received in culture bottles   Culture NO GROWTH 4 DAYS  Final   Report Status PENDING  Incomplete  Culture, respiratory (NON-Expectorated)     Status: None   Collection Time: 02/22/17 10:00 AM  Result Value Ref Range Status   Specimen Description TRACHEAL ASPIRATE  Final   Special Requests Normal  Final   Gram Stain   Final    FEW WBC PRESENT,BOTH PMN AND MONONUCLEAR NO ORGANISMS SEEN    Culture   Final    Consistent with normal respiratory flora. Performed at [facility], [address] [place], [place] [address]    Report Status 02/25/2017 FINAL  Final  MRSA PCR Screening     Status: None   Collection Time: 02/22/17 10:50 AM  Result Value Ref Range Status   MRSA by PCR NEGATIVE NEGATIVE Final    Comment:        The GeneXpert MRSA Assay (FDA approved for NASAL specimens only), is one component of a comprehensive MRSA colonization surveillance program. It is not intended to diagnose MRSA infection nor to guide or monitor treatment for MRSA infections.   Urine culture     Status: None   Collection Time: 02/22/17 11:26 AM  Result Value Ref Range Status   Specimen Description URINE, CATHETERIZED  Final   Special Requests NONE  Final   Culture   Final    NO GROWTH Performed at [facility], [address] [place], [place] [address]    Report Status 02/24/2017 FINAL  Final    Radiology Reports Dg Abd 1 View  Result Date: 02/24/2017 CLINICAL DATA:  NG tube placement EXAM: ABDOMEN - 1 VIEW COMPARISON:  02/23/2017 FINDINGS: The tip and side port of a gastric tube are noted in the right upper quadrant presumably in the distal stomach. The patient is slightly rotated on current exam. Cardiomegaly is noted with central line catheter tip projecting over the cavoatrial junction. IMPRESSION: 1. Gastric tube in the right upper quadrant suggesting a distal gastric position of the gastric tube tip. 2. Cardiomegaly with central line catheter  in the expected location of the cavoatrial junction. Electronically Signed   By: Ashley Royalty M.D.   On: 02/24/2017 01:59   US Venous Img Lower Bilateral  Result Date: 02/23/2017 CLINICAL DATA:  Bilateral lower extremity edema. EXAM: BILATERAL LOWER EXTREMITY VENOUS DOPPLER ULTRASOUND TECHNIQUE: Gray-scale sonography with graded compression, as well as color Doppler and duplex ultrasound were performed to evaluate the lower extremity deep venous systems from the level of the common femoral vein and including the common femoral, femoral, profunda femoral, popliteal and calf veins including the posterior tibial, peroneal and gastrocnemius veins when visible. The superficial great saphenous vein was also interrogated. Spectral Doppler was utilized to evaluate flow at rest and with distal augmentation maneuvers in the common femoral, femoral and popliteal veins. COMPARISON:  Ultrasound 06/13/2007. FINDINGS: RIGHT LOWER EXTREMITY Common Femoral Vein: No evidence of thrombus. Normal compressibility, respiratory phasicity and response to augmentation. Saphenofemoral Junction: No evidence of thrombus. Normal compressibility and flow on color Doppler imaging. Profunda Femoral Vein: No evidence of thrombus. Normal compressibility and flow on color Doppler imaging. Femoral Vein: No evidence of thrombus. Normal compressibility, respiratory phasicity  and response to augmentation. Popliteal Vein: No evidence of thrombus. Normal compressibility, respiratory phasicity and response to augmentation. Calf Veins: No evidence of thrombus. Normal compressibility and flow on color Doppler imaging. Limited visualization due to edema and patient clinical condition . Superficial Great Saphenous Vein: No evidence of thrombus. Normal compressibility and flow on color Doppler imaging. Other Findings:  None. LEFT LOWER EXTREMITY Common Femoral Vein: No evidence of thrombus. Normal compressibility, respiratory phasicity and response to  augmentation. Saphenofemoral Junction: No evidence of thrombus. Normal compressibility and flow on color Doppler imaging. Profunda Femoral Vein: No evidence of thrombus. Normal compressibility and flow on color Doppler imaging. Femoral Vein: No evidence of thrombus. Normal compressibility, respiratory phasicity and response to augmentation. Popliteal Vein: No evidence of thrombus. Normal compressibility, respiratory phasicity and response to augmentation. Calf Veins: No evidence of thrombus. Normal compressibility and flow on color Doppler imaging. Limited visualization due to edema and patient clinical condition . Superficial Great Saphenous Vein: No evidence of thrombus. Normal compressibility and flow on color Doppler imaging. Other Findings:  None. IMPRESSION: No evidence of DVT within either lower extremity. Electronically Signed   By: Marcello Moores  Register   On: 02/23/2017 12:51   Dg Chest Port 1 View  Result Date: 02/25/2017 CLINICAL DATA:  Respiratory failure. EXAM: PORTABLE CHEST 1 VIEW COMPARISON:  02/23/2017. FINDINGS: Interim extubation removal of NG tube. Right IJ line noted with tip projected over the right atrium. Cardiomegaly with pulmonary venous congestion and bilateral pulmonary infiltrates consistent pulmonary edema. Small bilateral pleural effusions. IMPRESSION: 1. Interim extubation and removal of NG tube. Right IJ line noted with tip projected over the right atrium. 2. Cardiomegaly with bilateral pulmonary venous congestion and pulmonary infiltrates consistent pulmonary edema. Small bilateral pleural effusions. No change from prior exam. Electronically Signed   By: Marcello Moores  Register   On: 02/25/2017 06:48   Dg Chest Port 1 View  Result Date: 02/24/2017 CLINICAL DATA:  Respiratory failure. EXAM: PORTABLE CHEST 1 VIEW COMPARISON:  02/23/2017. FINDINGS: Endotracheal tube, NG tube, right IJ line in stable position. Cardiomegaly with bilateral pulmonary infiltrates/edema and small bilateral  pleural effusions. Findings most consistent with congestive heart failure. Slight worsening from prior exam . IMPRESSION: 1. Lines and tubes in stable position. 2. Congestive heart failure pulmonary edema. Slight worsening of pulmonary edema from prior exams. Small bilateral pleural effusions again noted. Electronically Signed   By: Marcello Moores  Register   On: 02/24/2017 07:39   Dg Chest Port 1 View  Result Date: 02/23/2017 CLINICAL DATA:  Acute respiratory failure. EXAM: PORTABLE CHEST 1 VIEW COMPARISON:  02/23/2017 chest radiograph. FINDINGS: Right rotated chest radiograph. Endotracheal tube tip is 2.1 cm above the carina. Right internal jugular central venous catheter terminates at the cavoatrial junction. Enteric tube enters the lower thoracic esophagus, with the tip not seen on this image. Stable cardiomediastinal silhouette with mild cardiomegaly. No pneumothorax. Small bilateral pleural effusions. Mild-to-moderate pulmonary edema. Bibasilar atelectasis. IMPRESSION: 1. Well-positioned endotracheal tube. Enteric tube enters the lower thoracic esophagus with the tip not seen on this image. 2. Mild-to-moderate congestive heart failure. 3. Small bilateral pleural effusions with bibasilar atelectasis. Electronically Signed   By: Ilona Sorrel M.D.   On: 02/23/2017 19:56   Dg Chest Port 1 View  Result Date: 02/23/2017 CLINICAL DATA:  Shortness of breath. EXAM: PORTABLE CHEST 1 VIEW COMPARISON:  02/22/2017. FINDINGS: Surgical clips right chest. Endotracheal tube and right IJ line stable position. NG tube tip remains projected over the lower esophagus, advancement of approximately 20 cm  should be considered. Patchy infiltrates in the right lung have improved. Diffuse left lung infiltrate remains. Basilar atelectasis. No prominent pleural effusion. No pneumothorax. Stable cardiomegaly . IMPRESSION: 1. NG tube tip again noted over the lower esophagus. Advancement of approximately 20 cm should be considered.  Endotracheal tube and right IJ line stable position. 2. Partial clearing of patchy infiltrates right lung. Diffuse left lung infiltrate remains. Low lung volumes. 3. Stable cardiomegaly. Critical Value/emergent results were called by telephone at the time of interpretation on 02/23/2017 at 6:44 am to nurse Pamala Hurry , who verbally acknowledged these results. Electronically Signed   By: Marcello Moores  Register   On: 02/23/2017 06:48   Dg Chest Port 1 View  Result Date: 02/22/2017 CLINICAL DATA:  62 year old female post CPR.  Subsequent encounter. EXAM: PORTABLE CHEST 1 VIEW COMPARISON:  02/22/2017 8:52 a.m. FINDINGS: Endotracheal tube tip 3.6 cm above the carina. Endotracheal tube tip mid to distal esophagus level with side hole proximal to mid esophagus level. This needs to be reposition. Right internal jugular catheter tip mid superior vena cava level. Cardiomegaly. Patchy consolidation most notable right upper lobe, left midlung zone and right perihilar region. Question multifocal pneumonia versus asymmetric pulmonary edema. No gross pneumothorax.  Evaluation limited by overlying structures. No obvious rib fracture. IMPRESSION: Endotracheal tube tip 3.6 cm above the carina. Endotracheal tube tip mid to distal esophagus level with side hole proximal to mid esophagus level. This needs to be reposition. Right internal jugular catheter tip mid superior vena cava level. Cardiomegaly. Patchy consolidation most notable right upper lobe, left midlung zone and right perihilar region. Question multifocal pneumonia versus asymmetric pulmonary edema. No gross pneumothorax.  Evaluation limited by overlying structures. Electronically Signed   By: Genia Del M.D.   On: 02/22/2017 10:52   Dg Chest Portable 1 View  Result Date: 02/22/2017 CLINICAL DATA:  Hypoxia EXAM: PORTABLE CHEST 1 VIEW COMPARISON:  Study obtained earlier in the day. FINDINGS: Endotracheal tube tip is 3.2 cm above the carina. No pneumothorax. There is  interstitial pulmonary edema with bibasilar atelectasis. No airspace consolidation. There is cardiomegaly with pulmonary venous hypertension. There is aortic atherosclerosis. No adenopathy. No bone lesions. IMPRESSION: Endotracheal tube as described without pneumothorax. Changes of congestive heart failure. No consolidation. There is aortic atherosclerosis. Aortic Atherosclerosis (ICD10-I70.0). Electronically Signed   By: Lowella Grip III M.D.   On: 02/22/2017 09:06   Dg Chest Portable 1 View  Result Date: 02/22/2017 CLINICAL DATA:  Endotracheal tube placement EXAM: PORTABLE CHEST 1 VIEW COMPARISON:  01/27/2017 FINDINGS: Endotracheal tube in good position.  NG tip not well seen. Interval worsening of diffuse bilateral airspace disease most prominent right upper lobe. Possible edema versus pneumonia. No pleural effusion. Cardiac enlargement IMPRESSION: Endotracheal tube in good position. Significant worsening in bilateral airspace disease which may represent pneumonia or edema. Electronically Signed   By: Franchot Gallo M.D.   On: 02/22/2017 08:16   Dg Abd Portable 1v  Result Date: 02/23/2017 CLINICAL DATA:  Nasogastric tube placement EXAM: PORTABLE ABDOMEN - 1 VIEW COMPARISON:  Yesterday FINDINGS: Nasogastric tube tip overlaps the distal stomach. Central line with tip at the upper cavoatrial junction. Chest x-ray was obtained earlier today. Stable bowel gas pattern with mild gaseous distention of right colon and distal stomach. Cholecystectomy clips. Artifact from defibrillator pads. IMPRESSION: Nasogastric tube tip overlaps the  distal stomach. Electronically Signed   By: Monte Fantasia M.D.   On: 02/23/2017 09:25   Dg Abd Portable 1v  Result Date: 02/22/2017 CLINICAL  DATA:  Orogastric tube placement EXAM: PORTABLE ABDOMEN - 1 VIEW COMPARISON:  None. FINDINGS: No appreciable orogastric tube. There is mild dilatation in the transverse colon region. No small bowel dilatation. No air-fluid level. No  free air evident. There are surgical clips the right upper quadrant. IMPRESSION: No orogastric tube evident. Question a degree of colonic ileus. There does not appear to be bowel obstruction or free air. Electronically Signed   By: Lowella Grip III M.D.   On: 02/22/2017 10:51     CBC  Recent Labs Lab 02/22/17 0755 02/22/17 1040 02/23/17 0445 02/24/17 0403 02/24/17 1702 02/25/17 0501 02/26/17 0827  WBC 16.1* 34.1* 20.7* 17.5*  --  17.3* 14.2*  HGB 8.6* 8.3* 7.6* 6.5* 7.8* 8.3* 8.6*  HCT 30.2* 29.4* 25.8* 22.4* 25.7* 26.9* 28.2*  PLT 373 377 288 228  --  233 232  MCV 72.9* 71.5* 70.0* 67.9*  --  67.6* 70.1*  MCH 20.8* 20.2* 20.7* 19.9*  --  20.8* 21.2*  MCHC 28.5* 28.2* 29.7* 29.3*  --  30.8* 30.3*  RDW 20.7* 20.2* 20.2* 19.6*  --  20.1* 20.4*  LYMPHSABS 2.7  --   --   --   --   --  0.8*  MONOABS 1.4*  --   --   --   --   --  1.3*  EOSABS 0.2  --   --   --   --   --  0.1  BASOSABS 0.2*  --   --   --   --   --  0.1    Chemistries   Recent Labs Lab 02/22/17 0755  02/22/17 1706  02/23/17 0445 02/23/17 0826 02/23/17 1730 02/24/17 0403 02/25/17 0501 02/26/17 0827  NA 139  < > 137  < > 138  --  138 141 141 141  K 4.3  < > 5.9*  < > 5.3* 5.3* 4.8 4.7 4.1 4.6  CL 103  < > 105  < > 103  --  102 102 100* 100*  CO2 20*  < > 23  < > 25  --  _0 32  GLUCOSE 356*  < > 422*  < > 316*  --  116* 115* 175* 185*  BUN 37*  < > 49*  < > 56*  --  59* 66* 53* 41*  CREATININE 2.11*  < > 2.32*  < > 2.50*  --  2.75* 2.77* 1.96* 1.57*  CALCIUM 8.6*  < > 7.7*  < > 7.5*  --  7.6* 7.7* 8.6* 9.1  MG  --   --  2.2  --   --   --   --   --   --  2.2  AST 38  --   --   --   --   --   --  29 23  --   ALT 17  --   --   --   --   --   --  24 21  --   ALKPHOS 87  --   --   --   --   --   --  58 70  --   BILITOT 0.5  --   --   --   --   --   --  0.8 1.2  --   < > = values in this interval not  displayed. ------------------------------------------------------------------------------------------------------------------ estimated creatinine clearance is 55.8 mL/min (A) (by C-G formula based on SCr of 1.57 mg/dL (H)). ------------------------------------------------------------------------------------------------------------------ No  results for input(s): HGBA1C in the last 72 hours. ------------------------------------------------------------------------------------------------------------------ No results for input(s): CHOL, HDL, LDLCALC, TRIG, CHOLHDL, LDLDIRECT in the last 72 hours. ------------------------------------------------------------------------------------------------------------------ No results for input(s): TSH, T4TOTAL, T3FREE, THYROIDAB in the last 72 hours.  Invalid input(s): FREET3 ------------------------------------------------------------------------------------------------------------------ No results for input(s): VITAMINB12, FOLATE, FERRITIN, TIBC, IRON, RETICCTPCT in the last 72 hours.  Coagulation profile  Recent Labs Lab 02/22/17 0755 02/23/17 0445 02/24/17 0403 02/25/17 0501 02/26/17 0827  INR 4.33* 5.89* 7.14* 3.08 1.44    No results for input(s): DDIMER in the last 72 hours.  Cardiac Enzymes  Recent Labs Lab 02/22/17 1040 02/22/17 1706 02/22/17 2243  TROPONINI 0.04* 0.08* 0.09*   ------------------------------------------------------------------------------------------------------------------ Invalid input(s): POCBNP    Assessment & Plan   IMPRESSION AND PLAN: Patient is a 61 year old status post respiratory arrest  1. Acute respiratory failure Due to pneumoniaAnd CHF- continue IV cefepime Extubated. Echo in June revealed normal ejection fraction, repeat echocardiogram this admission is pending Pulmonary following, however, patient's oxygen requirement is increasing, give one dose of Lasix intravenously, following creatinine  in the morning, oxygenation  2. Status post cardiac arrest Suspect due to her underlying lung issues  3. Diabetes type 2 continue sliding scale insulin,   4. History of hypertension blood pressure stable  5. Hypothyroidism  TSH is suppressed Likely due to euthyroid sick syndrome  6. Morbid obesity  7.Acute renal failure on chronic kidney disease nephrology consult appreciated urine output stable renal function continues to improve  8. A. fib, RVR, now on amiodarone, changed to oral, cardiology consultation is requested  9. Leukocytosis, improving  10. Acute diastolic CHF, echocardiogram during this admission is pending, however, was normal recently, give one dose of Lasix intravenously . Follow urinary output, oxygenation     Code Status Orders        Start     Ordered   02/22/17 1004  Full code  Continuous     02/22/17 1003    Code Status History    Date Active Date Inactive Code Status Order ID Comments User Context   01/26/2017  4:36 PM 01/28/2017 12:34 PM Full Code [id number]  Baxter Hire, MD Inpatient           Consults Pulmonary critical care, nephrology  DVT Prophylaxis SCDs  Lab Results  Component Value Date   PLT 232 02/26/2017     Time Spent in minutes 32mn . Discussed with patient's family   Stacye Noori M.D on 02/26/2017 at 3:37 PM  Between 7am to 6pm - Pager - [phone]  After 6pm go to [email or web address] - password [email or web address]   Office  [phone]

## 2017-02-26 NOTE — Progress Notes (Signed)
Report Called to RN 2 A.  Prepare for transfer. See nursing assessment. Pharmacy called with INR result.  Mag and K+ WNL. Care Management consult placed for follow up with home care needs.  Pt not near her state of independence due to sorenessness post CPR and shOB

## 2017-02-26 NOTE — Progress Notes (Signed)
[facility] for [facility] Indication: atrial fibrillation  Allergies  Allergen Reactions  . Other Anaphylaxis    ILOSONE,  Caused GI distress.    Patient Measurements: Height: 5\' 6"  (167.6 cm) Weight: (!) 322 lb (146.1 kg) IBW/kg (Calculated) : 59.3   Vital Signs: Temp: 98.1 F (36.7 C) (07/21 0748) Temp Source: Oral (07/21 0748) BP: 140/65 (07/21 0800) Pulse Rate: 69 (07/21 0800)  Labs:  Recent Labs  02/24/17 0403 02/24/17 1702 02/25/17 0501 02/26/17 0827  HGB 6.5* 7.8* 8.3* 8.6*  HCT 22.4* 25.7* 26.9* 28.2*  PLT 228  --  233 232  LABPROT 63.6*  --  32.5* 17.7*  INR 7.14*  --  3.08 1.44  CREATININE 2.77*  --  1.96* 1.57*    Estimated Creatinine Clearance: 55.8 mL/min (A) (by C-G formula based on SCr of 1.57 mg/dL (H)).   Medical History: Past Medical History:  Diagnosis Date  . Anemia   . Anxiety   . Chronic kidney disease    INSUFFICIENCY  . COPD (chronic obstructive pulmonary disease) ([place])   . Diabetes mellitus without complication ([place])   . Dyspnea    DOE  . Dysrhythmia    A FIB  . Edema   . GERD (gastroesophageal reflux disease)   . History of kidney stones   . Hypertension   . Hypothyroidism    ABLATION  . Neuropathy   . Orthopnea   . Sleep apnea    CPAP  . Vertigo   . Wheezing    Assessment: 61 y/o F with a h/o atrial fibrillation on warfarin 8 mg daily PTA admitted with acute respiratory failure. Patient's INR was elevated on admission and is s/p vitamin K. Patient was started on amiodarone this admission.   Goal of Therapy:  INR 2-3  Plan:  Will resume warfarin at lower dose of 4 mg daily due to elevated INR on admission and concurrent amiodarone. Will f/u AM INR.   Ulice Dash D 02/26/2017,1:07 PM

## 2017-02-26 NOTE — Evaluation (Signed)
Clinical/Bedside Swallow Evaluation Patient Details  Name: Andrea Rivers MRN: [id number] Date of Birth: 1956-05-10  Today's Date: 02/26/2017 Time: SLP Start Time (ACUTE ONLY): 1120 SLP Stop Time (ACUTE ONLY): 1155 SLP Time Calculation (min) (ACUTE ONLY): 35 min  Past Medical History:  Past Medical History:  Diagnosis Date  . Anemia   . Anxiety   . Chronic kidney disease    INSUFFICIENCY  . COPD (chronic obstructive pulmonary disease) ([place])   . Diabetes mellitus without complication ([place])   . Dyspnea    DOE  . Dysrhythmia    A FIB  . Edema   . GERD (gastroesophageal reflux disease)   . History of kidney stones   . Hypertension   . Hypothyroidism    ABLATION  . Neuropathy   . Orthopnea   . Sleep apnea    CPAP  . Vertigo   . Wheezing    Past Surgical History:  Past Surgical History:  Procedure Laterality Date  . CATARACT EXTRACTION W/PHACO Left 01/26/2017   Procedure: CATARACT EXTRACTION PHACO AND INTRAOCULAR LENS PLACEMENT (IOC);  Surgeon: Estill Cotta, MD;  Location: [facility] ORS;  Service: Ophthalmology;  Laterality: Left;  [place]   1:02.2 AP     23.7 CDE   28.92 fluid pack lot# [id number] H exp.05/08/2018  . CHOLECYSTECTOMY    . TONSILLECTOMY     HPI:   Andrea Rivers  is a 61 y.o. female with a known history of COPD, sleep apnea, diabetes type 2, morbid obesity, hypothyroidism and hypertension presents with acute respiratory failure. EMS was called for shortness of breath. When EMS got there patient was in respiratory distress. She was initially placed on CPAP. And then on the way to the hospital ambulance sheet did not have a pulse CPR was initiated and she did receive 4 doses of epinephrine. She did regain spontaneous circulation. Her airway was secured with LMA. She subsequently was intubated. Currently on the ventilator unable to provide any review of systems. Pt was extubated on 7/19 and ST recieved orders to evaluate to see if her diet can be advanced.     Assessment / Plan / Recommendation Clinical Impression  Pt presents w/mild oral dysphagia with solid consistencies and no apparent pharyngeal dysphagia w/all tested consistencies. Pt given trials of thin, puree, and solid. Pt demonstrated no overt s/s of aspiration w/any tested consistency. Vocal quality remained clear throughout. Pt demonstrated mild oral dysphagia w/solid which appeared largely d/t a dry mouth. Pt was able to masticate solid, but did not attempt swallow until water was given. No pocketing was noted. Oral mech exam revealed oral motor abilities to be [facility]. Pt is judged to be mild risk of aspiration d/t deconditioning and overall medical status. Recommend Dysphagia III diet w/thin liquids and aspiration precautions. Will f/u in 1-2 days re: toleration of diet.  SLP Visit Diagnosis: Dysphagia, oral phase (R13.11)    Aspiration Risk  Mild aspiration risk    Diet Recommendation Dysphagia 3 (Mech soft);Thin liquid   Liquid Administration via: Cup;Straw Medication Administration: Whole meds with puree Supervision: Patient able to self feed Compensations: Minimize environmental distractions;Slow rate;Small sips/bites Postural Changes: Seated upright at 90 degrees;Remain upright for at least 30 minutes after po intake    Other  Recommendations Oral Care Recommendations: Oral care BID;Staff/trained caregiver to provide oral care   Follow up Recommendations Other (comment) (TBD)      Frequency and Duration min 3x week  2 weeks       Prognosis Prognosis for Safe Diet  Advancement: [name] Study   General Date of Onset: 02/22/17 HPI:  Andrea Rivers  is a 61 y.o. female with a known history of COPD, sleep apnea, diabetes type 2, morbid obesity, hypothyroidism and hypertension presents with acute respiratory failure. EMS was called for shortness of breath. When EMS got there patient was in respiratory distress. She was initially placed on CPAP. And then on the way to  the hospital ambulance sheet did not have a pulse CPR was initiated and she did receive 4 doses of epinephrine. She did regain spontaneous circulation. Her airway was secured with LMA. She subsequently was intubated. Currently on the ventilator unable to provide any review of systems. Pt was extubated on 7/19 and ST recieved orders to evaluate to see if her diet can be advanced.  Type of Study: Bedside Swallow Evaluation Previous Swallow Assessment: none reported Diet Prior to this Study: Dysphagia 1 (puree);Thin liquids Temperature Spikes Noted: No Respiratory Status: Room air History of Recent Intubation: No Behavior/Cognition: Alert;Cooperative Oral Cavity Assessment: Within Functional Limits Oral Care Completed by SLP: No Oral Cavity - Dentition: Adequate natural dentition Vision: Functional for self-feeding Self-Feeding Abilities: Able to feed self Patient Positioning: Upright in bed Baseline Vocal Quality: Normal Volitional Swallow: Able to elicit    Oral/Motor/Sensory Function Overall Oral Motor/Sensory Function: Within functional limits   Ice Chips Ice chips: Not tested   Thin Liquid Thin Liquid: Within functional limits Presentation: Straw    Nectar Thick Nectar Thick Liquid: Not tested   Honey Thick Honey Thick Liquid: Not tested   Puree Puree: Within functional limits Presentation: Spoon   Solid   GO   Solid: Impaired Presentation: Self Fed Oral Phase Impairments: Other (comment) (Pt required f/u sip of thin to clear solid d/t dry mouth)    Functional Assessment Tool Used: clinical judgment Functional Limitations: Swallowing Swallow Current Status (O5366): At least 1 percent but less than 20 percent impaired, limited or restricted Swallow Goal Status [phone]): At least 1 percent but less than 20 percent impaired, limited or restricted   Fraser,Azaria Bartell 02/26/2017,2:07 PM

## 2017-02-26 NOTE — Progress Notes (Signed)
PULMONARY / CRITICAL CARE MEDICINE   Name: Andrea Rivers MRN: [id number] DOB: 10/26/1955    ADMISSION DATE:  02/22/2017  PT PROFILE: 61 y.o. F with multiple medical problems including severe obesity awoke in the middle of the night with dyspnea. Her husband called EMS. She suffered cardiopulmonary arrest en route to the emergency department. She underwent resuscitation and ultimately intubation in the emergency department. Upon arrival to the ICU, she again suffered PEA arrest. She is on chronic warfarin for unknown reasons with admission INR of 4.33.   MAJOR EVENTS/TEST RESULTS: 07/17 admitted via ED with cardiopulmonary arrest 2, severe hypoxemia, persistent shock. 07/18 echocardiogram: 07/18 LE venous US: No evidence of DVT within either lower extremity 07/18 Reducing vasopressor requirements, reducing ventilatory support requirements, improving Uo 07/18 Echocardiogram:  07/19 Passed SBT, + F/C. Extubated and tolerating. One unit PRBCs transfused for Hgb 6.5  INDWELLING DEVICES:: ETT 07/17 >> 07/19 R IJ CVL 07/17 >>   MICRO DATA: MRSA PCR 07/17 >> NEG Strep antigen 07/17 >> NEG Urine 07/17 >> NEG Legionella antigen 07/17 >>  Resp 07/17 >>  Blood 07/17 >>   ANTIMICROBIALS:  Vancomycin 07/17 >> 07/18 Cefepime 07/17 >>  Azithromycin 07/17 >>   PCT 07/7: <0.10, 48.52, 42.93   SUBJECTIVE:  Patient started on amiodarone infusion Alert and awake, follows commands Patient denies chest pain Echo results still pending Follow up cardiology recommendations   VITAL SIGNS: BP (!) 149/67   Pulse 69   Temp 99 F (37.2 C)   Resp 16   Ht 5\' 6"  (1.676 m)   Wt (!) 322 lb (146.1 kg)   SpO2 99%   BMI 51.97 kg/m    INTAKE / OUTPUT: I/O last 3 completed shifts: In: 451.4 [I.V.:451.4] Out: 5530 [Urine:5530]  PHYSICAL EXAMINATION: General: RASS -1, + F/C Neuro: PERRL, EOMI, MAEs HEENT: NCAT, sclerae white Cardiovascular: NSR, no murmurs Lungs: diminished bibasilar,  no wheezes,crackles  Abdomen: Extremely obese, diminished to absent bowel sounds, soft Extremities: Cool, symmetric ankle edema   BMET  Recent Labs Lab 02/23/17 1730 02/24/17 0403 02/25/17 0501  NA 138 141 141  K 4.8 4.7 4.1  CL 102 102 100*  CO2 27 29 30   BUN 59* 66* 53*  CREATININE 2.75* 2.77* 1.96*  GLUCOSE 116* 115* 175*    Electrolytes  Recent Labs Lab 02/22/17 1706  02/23/17 1730 02/24/17 0403 02/25/17 0501  CALCIUM 7.7*  < > 7.6* 7.7* 8.6*  MG 2.2  --   --   --   --   < > = values in this interval not displayed.  CBC  Recent Labs Lab 02/23/17 0445 02/24/17 0403 02/24/17 1702 02/25/17 0501  WBC 20.7* 17.5*  --  17.3*  HGB 7.6* 6.5* 7.8* 8.3*  HCT 25.8* 22.4* 25.7* 26.9*  PLT 288 228  --  233    Coag's  Recent Labs Lab 02/22/17 0755 02/23/17 0445 02/24/17 0403 02/25/17 0501  APTT 49* 88*  --   --   INR 4.33* 5.89* 7.14* 3.08    Sepsis Markers  Recent Labs Lab 02/22/17 0800 02/22/17 1038 02/23/17 0445 02/24/17 0403 02/25/17 0501  LATICACIDVEN 9.4* 4.3*  --   --   --   PROCALCITON  --   --  48.52 42.93 16.93    ABG  Recent Labs Lab 02/22/17 1340 02/23/17 1130 02/24/17 0511  PHART 7.20* 7.31* 7.39  PCO2ART 53* 47 52*  PO2ART 51* 96 92    Liver Enzymes  Recent Labs Lab 02/22/17 0755  02/24/17 0403 02/25/17 0501  AST 38 29 23  ALT 17 24 21   ALKPHOS 87 58 70  BILITOT 0.5 0.8 1.2  ALBUMIN 3.5 3.0* 3.0*    Cardiac Enzymes  Recent Labs Lab 02/22/17 1040 02/22/17 1706 02/22/17 2243  TROPONINI 0.04* 0.08* 0.09*    Glucose  Recent Labs Lab 02/25/17 0817 02/25/17 1233 02/25/17 1603 02/25/17 1948 02/25/17 2343 02/26/17 0354  GLUCAP 156* 171* 188* 209* 223* 183*    CXR: CM, mild edema pattern   ASSESSMENT / PLAN:  PULMONARY A: Acute respiratory failure with hypoxemia-Resolved Ventilator dependence, resolved - extubated on 7/19 Suspected pneumonia, NOS Pulmonary edema - improving P:   Supplemental  O2 to maintain SPO2 > 90% Continue Azithromycin   CARDIOVASCULAR A:  Cardiac arrest 2 07/17 (PEA) - etiology unclear Minimally elevated trop I - doubt acute coronary syndrome Shock, presumed septic in origin - improving. Remains on low dose NE Recent atrial fibrillation - NSR presently Relative bradycardia, resolved P:  Cont NE to maintain MAP goal > 65 mmHg F/U Echocardiogram results (performed 07/18) Follow up cardiology recommendations  RENAL A:   AKI, now non-oliguric Mild metabolic acidosis, resolved Hyperkalemia, resolved P:   Monitor BMET intermittently Monitor I/Os Correct electrolytes as indicated Discontinue HCO3 infusion 07/18    HEMATOLOGIC A:   Anemia without evidence of acute blood loss Chronic warfarin therapy  Prolonged INR P:  DVT px: SCDs Monitor CBC intermittently Transfuse per usual guidelines Holding warfarin Vitamin K 1 dose 07/19 Monitor prothrombin time   Okay to transfer to telemetry floor   Euan Wandler Patricia Pesa, M.D.  [facility]  Medical Director [place] Director [facility] Cardio-Pulmonary Department

## 2017-02-27 ENCOUNTER — Inpatient Hospital Stay: Payer: Medicare Other

## 2017-02-27 LAB — CBC
HEMATOCRIT: 27.8 % — AB (ref 35.0–47.0)
Hemoglobin: 8.5 g/dL — ABNORMAL LOW (ref 12.0–16.0)
MCH: 21.1 pg — ABNORMAL LOW (ref 26.0–34.0)
MCHC: 30.7 g/dL — AB (ref 32.0–36.0)
MCV: 68.6 fL — AB (ref 80.0–100.0)
PLATELETS: 254 10*3/uL (ref 150–440)
RBC: 4.05 MIL/uL (ref 3.80–5.20)
RDW: 20.9 % — AB (ref 11.5–14.5)
WBC: 15.3 10*3/uL — AB (ref 3.6–11.0)

## 2017-02-27 LAB — PROTIME-INR
INR: 1.12
Prothrombin Time: 14.5 seconds (ref 11.4–15.2)

## 2017-02-27 LAB — CULTURE, BLOOD (ROUTINE X 2)
CULTURE: NO GROWTH
Culture: NO GROWTH

## 2017-02-27 LAB — BASIC METABOLIC PANEL
ANION GAP: 10 (ref 5–15)
BUN: 37 mg/dL — ABNORMAL HIGH (ref 6–20)
CALCIUM: 9.3 mg/dL (ref 8.9–10.3)
CO2: 32 mmol/L (ref 22–32)
CREATININE: 1.6 mg/dL — AB (ref 0.44–1.00)
Chloride: 99 mmol/L — ABNORMAL LOW (ref 101–111)
GFR, EST AFRICAN AMERICAN: 39 mL/min — AB (ref >60)
GFR, EST NON AFRICAN AMERICAN: 34 mL/min — AB (ref >60)
GLUCOSE: 137 mg/dL — AB (ref 65–99)
Potassium: 4.7 mmol/L (ref 3.5–5.1)
Sodium: 141 mmol/L (ref 135–145)

## 2017-02-27 LAB — GLUCOSE, CAPILLARY
GLUCOSE-CAPILLARY: 137 mg/dL — AB (ref 65–99)
GLUCOSE-CAPILLARY: 220 mg/dL — AB (ref 65–99)
GLUCOSE-CAPILLARY: 222 mg/dL — AB (ref 65–99)
Glucose-Capillary: 115 mg/dL — ABNORMAL HIGH (ref 65–99)
Glucose-Capillary: 127 mg/dL — ABNORMAL HIGH (ref 65–99)
Glucose-Capillary: 166 mg/dL — ABNORMAL HIGH (ref 65–99)
Glucose-Capillary: 227 mg/dL — ABNORMAL HIGH (ref 65–99)

## 2017-02-27 MED ORDER — WARFARIN SODIUM 6 MG PO TABS
6.0000 mg | ORAL_TABLET | Freq: Once | ORAL | Status: AC
  Administered 2017-02-27: 6 mg via ORAL
  Filled 2017-02-27: qty 1

## 2017-02-27 MED ORDER — SIMETHICONE 80 MG PO CHEW
80.0000 mg | CHEWABLE_TABLET | Freq: Four times a day (QID) | ORAL | Status: DC
  Administered 2017-02-27 – 2017-03-03 (×12): 80 mg via ORAL
  Filled 2017-02-27 (×19): qty 1

## 2017-02-27 MED ORDER — LORATADINE 10 MG PO TABS
10.0000 mg | ORAL_TABLET | Freq: Every day | ORAL | Status: DC
  Administered 2017-02-27 – 2017-03-03 (×5): 10 mg via ORAL
  Filled 2017-02-27 (×5): qty 1

## 2017-02-27 MED ORDER — SIMETHICONE 80 MG PO CHEW
80.0000 mg | CHEWABLE_TABLET | Freq: Four times a day (QID) | ORAL | Status: DC | PRN
  Administered 2017-02-27: 80 mg via ORAL
  Filled 2017-02-27 (×3): qty 1

## 2017-02-27 MED ORDER — FUROSEMIDE 10 MG/ML IJ SOLN
40.0000 mg | Freq: Once | INTRAMUSCULAR | Status: AC
  Administered 2017-02-27: 40 mg via INTRAVENOUS
  Filled 2017-02-27: qty 4

## 2017-02-27 MED ORDER — AMIODARONE HCL 200 MG PO TABS
400.0000 mg | ORAL_TABLET | Freq: Every day | ORAL | Status: DC
  Administered 2017-02-28 – 2017-03-03 (×4): 400 mg via ORAL
  Filled 2017-02-27 (×4): qty 2

## 2017-02-27 NOTE — Progress Notes (Signed)
[facility] for Coumadin Indication: atrial fibrillation  Allergies  Allergen Reactions  . Other Anaphylaxis    ILOSONE,  Caused GI distress.    Patient Measurements: Height: 5\' 6"  (167.6 cm) Weight: (!) 322 lb (146.1 kg) IBW/kg (Calculated) : 59.3   Vital Signs: Temp: 97.6 F (36.4 C) (07/22 0412) Temp Source: Oral (07/21 1954) BP: 129/69 (07/22 0412) Pulse Rate: 60 (07/22 0412)  Labs:  Recent Labs  02/25/17 0501 02/26/17 0827 02/27/17 0449  HGB 8.3* 8.6* 8.5*  HCT 26.9* 28.2* 27.8*  PLT 233 232 254  LABPROT 32.5* 17.7* 14.5  INR 3.08 1.44 1.12  CREATININE 1.96* 1.57* 1.60*    Estimated Creatinine Clearance: 54.8 mL/min (A) (by C-G formula based on SCr of 1.6 mg/dL (H)).  Assessment: 61 y/o F with a h/o atrial fibrillation on warfarin 8 mg daily PTA admitted with acute respiratory failure. Patient's INR was elevated on admission (on fluconazole PTA per pharmacy records) and is s/p vitamin K. Patient was started on amiodarone 7/20.  Goal of Therapy:  INR 2-3  Plan:  INR subtherapeutic and trending down. Will give warfarin 6mg  x 1 today, recheck with AM labs.   Andrea Rivers C 02/27/2017,7:47 AM

## 2017-02-27 NOTE — Progress Notes (Signed)
[facility] Cardiology  SUBJECTIVE: The patient reports feeling better, denies chest pain, palpitations, or shortness of breath   Vitals:   02/26/17 1330 02/26/17 1954 02/27/17 0412 02/27/17 0800  BP: (!) 132/47 132/65 129/69 (!) 144/86  Pulse: 71 (!) 57 60 (!) 58  Resp: 20 18 18 18   Temp: 98 F (36.7 C) 97.9 F (36.6 C) 97.6 F (36.4 C) 98.4 F (36.9 C)  TempSrc: Oral Oral  Oral  SpO2: 96% 99% (!) 89% 95%  Weight:      Height:         Intake/Output Summary (Last 24 hours) at 02/27/17 1136 Last data filed at 02/27/17 0900  Gross per 24 hour  Intake              240 ml  Output              652 ml  Net             -412 ml      PHYSICAL EXAM  General: Well developed, well nourished, in no acute distress HEENT:  Normocephalic and atramatic Neck:  No JVD.  Lungs: Clear bilaterally to auscultation and percussion. Heart: HRRR . Normal S1 and S2 without gallops or murmurs.  Abdomen: Bowel sounds are positive, abdomen soft and non-tender  Msk:  Back normal, normal gait. Normal strength and tone for age. Extremities: No clubbing, cyanosis or edema.   Neuro: Alert and oriented X 3. Psych:  Good affect, responds appropriately   LABS: Basic Metabolic Panel:  Recent Labs  02/26/17 0827 02/27/17 0449  NA 141 141  K 4.6 4.7  CL 100* 99*  CO2 32 32  GLUCOSE 185* 137*  BUN 41* 37*  CREATININE 1.57* 1.60*  CALCIUM 9.1 9.3  MG 2.2  --    Liver Function Tests:  Recent Labs  02/25/17 0501  AST 23  ALT 21  ALKPHOS 70  BILITOT 1.2  PROT 7.2  ALBUMIN 3.0*   No results for input(s): LIPASE, AMYLASE in the last 72 hours. CBC:  Recent Labs  02/26/17 0827 02/27/17 0449  WBC 14.2* 15.3*  NEUTROABS 11.9*  --   HGB 8.6* 8.5*  HCT 28.2* 27.8*  MCV 70.1* 68.6*  PLT 232 254   Cardiac Enzymes: No results for input(s): CKTOTAL, CKMB, CKMBINDEX, TROPONINI in the last 72 hours. BNP: Invalid input(s): POCBNP D-Dimer: No results for input(s): DDIMER in the last 72  hours. Hemoglobin A1C: No results for input(s): HGBA1C in the last 72 hours. Fasting Lipid Panel: No results for input(s): CHOL, HDL, LDLCALC, TRIG, CHOLHDL, LDLDIRECT in the last 72 hours. Thyroid Function Tests: No results for input(s): TSH, T4TOTAL, T3FREE, THYROIDAB in the last 72 hours.  Invalid input(s): FREET3 Anemia Panel: No results for input(s): VITAMINB12, FOLATE, FERRITIN, TIBC, IRON, RETICCTPCT in the last 72 hours.  Dg Chest Port 1 View  Result Date: 02/27/2017 CLINICAL DATA:  CHF, COPD, hypertension, former smoker, GERD EXAM: PORTABLE CHEST 1 VIEW COMPARISON:  Portable exam 0747 hours compared to 02/25/2017 FINDINGS: Enlargement of cardiac silhouette with mild pulmonary vascular congestion. Enlarged hila bilaterally. Infiltrates in the perihilar basilar regions bilaterally question improved pulmonary edema. Residual bibasilar atelectasis and question small RIGHT pleural effusion. No pneumothorax or acute osseous findings. IMPRESSION: Improved pulmonary edema with persistent bibasilar atelectasis with small RIGHT pleural effusion. Prominent hila bilaterally, recommend attention on follow-up exams. Electronically Signed   By: Lavonia Dana M.D.   On: 02/27/2017 08:27     Echo LV EF 50-55%  TELEMETRY: Sinus rhythm at 60 bpm:  ASSESSMENT AND PLAN:  Active Problems:   Acute respiratory failure (HCC)   Shock ([place])    1. Paroxysmal atrial fibrillation, on warfarin for stroke prevention, converted to sinus rhythm, on amiodarone for rhythm control 2. Respiratory failure, stabilized 3. Stage IV chronic kidney disease  Recommendations  1. Continue current medications 2. Decrease amiodarone 400 mg daily 3. Continue warfarin for stroke prevention  Signed off for now, please call if any questions   Isaias Cowman, MD, PhD, [facility] 02/27/2017 11:36 AM

## 2017-02-27 NOTE — Progress Notes (Signed)
Andrea Rivers at [facility]                                                                                                                                                                                  Patient Demographics   Andrea Rivers, is a 61 y.o. female, DOB - Feb 12, 1956, [id number]  Admit date - 02/22/2017   Admitting Physician Andrea Flock, MD  Outpatient Primary MD for the patient is Gauger, Andrea Lain, NP   LOS - 5  Subjective: The patient remains in sinus rhythm With high doses of amiodarone , now orally . Diuresed yesterday with 40 mg of Lasix, but remains on 5 L of oxygen with marginal oxygen saturations.  Chest x-ray shows improvement in congestive heart failure Patient is bradycardic, amiodarone dose is decreased to 400 mg daily by cardiologist The patient feels overall better today  Review of Systems:     CONSTITUTIONAL: No documented fever. No fatigue, weakness. No weight gain, no weight loss.  EYES: No blurry or double vision.  ENT: No tinnitus. No postnasal drip. No redness of the oropharynx.  RESPIRATORY: No cough, no wheeze, no hemoptysis. No dyspnea.  CARDIOVASCULAR: No chest pain. No orthopnea. No palpitations. No syncope.  GASTROINTESTINAL: No nausea, no vomiting or diarrhea. No abdominal pain. No melena or hematochezia.  GENITOURINARY:  No urgency. No frequency. No dysuria. No hematuria. No obstructive symptoms. No discharge. No pain. No significant abnormal bleeding ENDOCRINE: No polyuria or nocturia. No heat or cold intolerance.  HEMATOLOGY: No anemia. No bruising. No bleeding. No purpura. No petechiae INTEGUMENTARY: No rashes. No lesions.  MUSCULOSKELETAL: Complains of pain in the right arm has a history of bursitis NEUROLOGIC: No numbness, tingling, or ataxia. No seizure-type activity.  PSYCHIATRIC: No anxiety. No insomnia. No ADD.       Vitals:   Vitals:   02/26/17 1954 02/27/17 0412 02/27/17 0800 02/27/17  1148  BP: 132/65 129/69 (!) 144/86 (!) 130/56  Pulse: (!) 57 60 (!) 58 63  Resp: '18 18 18 20  ' Temp: 97.9 F (36.6 C) 97.6 F (36.4 C) 98.4 F (36.9 C) 98.5 F (36.9 C)  TempSrc: Oral  Oral Oral  SpO2: 99% (!) 89% 95% 91%  Weight:      Height:        Wt Readings from Last 3 Encounters:  02/22/17 (!) 146.1 kg (322 lb)  01/26/17 135.8 kg (299 lb 6.2 oz)     Intake/Output Summary (Last 24 hours) at 02/27/17 1507 Last data filed at 02/27/17 1200  Gross per 24 hour  Intake              240 ml  Output  1552 ml  Net            -1312 ml    Physical Exam:   GENERAL: Critically ill-appearing, Obese HEAD, EYES, EARS, NOSE AND THROAT: Atraumatic, normocephalic.. Pupils equal and reactive to light. Sclerae anicteric. No conjunctival injection. No oro-pharyngeal erythema.  NECK: Supple. There is no jugular venous distention. No bruits, no lymphadenopathy, no thyromegaly.  HEART: Regularly irregular No murmurs, no rubs, no clicks.  LUNGS: Rhonchus breath sounds bilaterally  ABDOMEN: Soft, flat, nontender, nondistended. Has good bowel sounds. No hepatosplenomegaly appreciated.  EXTREMITIES: No evidence of any cyanosis, clubbing, Trace peripheral edema.  +2 pedal and radial pulses bilaterally.  NEUROLOGIC: Sedated SKIN: Moist and warm with no rashes appreciated.  Psych: Sedated  LN: No inguinal LN enlargement    Antibiotics   Anti-infectives    Start     Dose/Rate Route Frequency Ordered Stop   02/22/17 1500  ceFEPIme (MAXIPIME) 2 g in dextrose 5 % 50 mL IVPB  Status:  Discontinued     2 g 100 mL/hr over 30 Minutes Intravenous Every 12 hours 02/22/17 1127 02/24/17 1111   02/22/17 1400  vancomycin (VANCOCIN) 1,250 mg in sodium chloride 0.9 % 250 mL IVPB  Status:  Discontinued     1,250 mg 166.7 mL/hr over 90 Minutes Intravenous Every 18 hours 02/22/17 0842 02/23/17 0839   02/22/17 1200  azithromycin (ZITHROMAX) 500 mg in dextrose 5 % 250 mL IVPB  Status:  Discontinued      500 mg 250 mL/hr over 60 Minutes Intravenous Every 24 hours 02/22/17 1113 02/25/17 1054   02/22/17 0845  piperacillin-tazobactam (ZOSYN) IVPB 4.5 g  Status:  Discontinued     4.5 g 200 mL/hr over 30 Minutes Intravenous Every 8 hours 02/22/17 0842 02/22/17 1113   02/22/17 0830  vancomycin (VANCOCIN) IVPB 1000 mg/200 mL premix     1,000 mg 200 mL/hr over 60 Minutes Intravenous  Once 02/22/17 0823 02/22/17 1030   02/22/17 0815  vancomycin (VANCOCIN) injection 2,000 mg  Status:  Discontinued     2,000 mg Intravenous  Once 02/22/17 0804 02/22/17 0823   02/22/17 0815  piperacillin-tazobactam (ZOSYN) IVPB 3.375 g     3.375 g 100 mL/hr over 30 Minutes Intravenous  Once 02/22/17 0804 02/22/17 0925      Medications   Scheduled Meds: . [START ON 02/28/2017] amiodarone  400 mg Oral Daily  . chlorhexidine gluconate (MEDLINE KIT)  15 mL Mouth Rinse BID  . citalopram  20 mg Oral Daily  . insulin aspart  0-15 Units Subcutaneous Q4H  . insulin glargine  20 Units Subcutaneous QHS  . loratadine  10 mg Oral Daily  . mouth rinse  15 mL Mouth Rinse BID  . metoprolol tartrate  25 mg Oral Q6H  . simethicone  80 mg Oral QID  . warfarin  6 mg Oral ONCE-1800  . Warfarin - Pharmacist Dosing Inpatient   Does not apply q1800   Continuous Infusions: . norepinephrine (LEVOPHED) Adult infusion Stopped (02/24/17 0904)   PRN Meds:.acetaminophen **OR** [DISCONTINUED] acetaminophen, [DISCONTINUED] ondansetron **OR** ondansetron (ZOFRAN) IV, oxyCODONE, simethicone, sodium chloride flush   Data Review:   Micro Results Recent Results (from the past 240 hour(s))  Blood Culture (routine x 2)     Status: None   Collection Time: 02/22/17  7:55 AM  Result Value Ref Range Status   Specimen Description BLOOD RIGHT AV  Final   Special Requests   Final    BOTTLES DRAWN AEROBIC AND ANAEROBIC Blood Culture  results may not be optimal due to an excessive volume of blood received in culture bottles   Culture NO GROWTH 5  DAYS  Final   Report Status 02/27/2017 FINAL  Final  Blood Culture (routine x 2)     Status: None   Collection Time: 02/22/17  8:15 AM  Result Value Ref Range Status   Specimen Description BLOOD RIGHT HAND  Final   Special Requests   Final    BOTTLES DRAWN AEROBIC AND ANAEROBIC Blood Culture results may not be optimal due to an excessive volume of blood received in culture bottles   Culture NO GROWTH 5 DAYS  Final   Report Status 02/27/2017 FINAL  Final  Culture, respiratory (NON-Expectorated)     Status: None   Collection Time: 02/22/17 10:00 AM  Result Value Ref Range Status   Specimen Description TRACHEAL ASPIRATE  Final   Special Requests Normal  Final   Gram Stain   Final    FEW WBC PRESENT,BOTH PMN AND MONONUCLEAR NO ORGANISMS SEEN    Culture   Final    Consistent with normal respiratory flora. Performed at [facility], [place] [address] [place], [place] [address]    Report Status 02/25/2017 FINAL  Final  MRSA PCR Screening     Status: None   Collection Time: 02/22/17 10:50 AM  Result Value Ref Range Status   MRSA by PCR NEGATIVE NEGATIVE Final    Comment:        The GeneXpert MRSA Assay (FDA approved for NASAL specimens only), is one component of a comprehensive MRSA colonization surveillance program. It is not intended to diagnose MRSA infection nor to guide or monitor treatment for MRSA infections.   Urine culture     Status: None   Collection Time: 02/22/17 11:26 AM  Result Value Ref Range Status   Specimen Description URINE, CATHETERIZED  Final   Special Requests NONE  Final   Culture   Final    NO GROWTH Performed at [facility], [address] [place], [place] [address]    Report Status 02/24/2017 FINAL  Final    Radiology Reports Dg Abd 1 View  Result Date: 02/24/2017 CLINICAL DATA:  NG tube placement EXAM: ABDOMEN - 1 VIEW COMPARISON:  02/23/2017 FINDINGS: The tip and side port of a gastric tube are noted in the right upper quadrant  presumably in the distal stomach. The patient is slightly rotated on current exam. Cardiomegaly is noted with central line catheter tip projecting over the cavoatrial junction. IMPRESSION: 1. Gastric tube in the right upper quadrant suggesting a distal gastric position of the gastric tube tip. 2. Cardiomegaly with central line catheter in the expected location of the cavoatrial junction. Electronically Signed   By: Ashley Royalty M.D.   On: 02/24/2017 01:59   US Venous Img Lower Bilateral  Result Date: 02/23/2017 CLINICAL DATA:  Bilateral lower extremity edema. EXAM: BILATERAL LOWER EXTREMITY VENOUS DOPPLER ULTRASOUND TECHNIQUE: Gray-scale sonography with graded compression, as well as color Doppler and duplex ultrasound were performed to evaluate the lower extremity deep venous systems from the level of the common femoral vein and including the common femoral, femoral, profunda femoral, popliteal and calf veins including the posterior tibial, peroneal and gastrocnemius veins when visible. The superficial great saphenous vein was also interrogated. Spectral Doppler was utilized to evaluate flow at rest and with distal augmentation maneuvers in the common femoral, femoral and popliteal veins. COMPARISON:  Ultrasound 06/13/2007. FINDINGS: RIGHT LOWER EXTREMITY Common Femoral  Vein: No evidence of thrombus. Normal compressibility, respiratory phasicity and response to augmentation. Saphenofemoral Junction: No evidence of thrombus. Normal compressibility and flow on color Doppler imaging. Profunda Femoral Vein: No evidence of thrombus. Normal compressibility and flow on color Doppler imaging. Femoral Vein: No evidence of thrombus. Normal compressibility, respiratory phasicity and response to augmentation. Popliteal Vein: No evidence of thrombus. Normal compressibility, respiratory phasicity and response to augmentation. Calf Veins: No evidence of thrombus. Normal compressibility and flow on color Doppler imaging.  Limited visualization due to edema and patient clinical condition . Superficial Great Saphenous Vein: No evidence of thrombus. Normal compressibility and flow on color Doppler imaging. Other Findings:  None. LEFT LOWER EXTREMITY Common Femoral Vein: No evidence of thrombus. Normal compressibility, respiratory phasicity and response to augmentation. Saphenofemoral Junction: No evidence of thrombus. Normal compressibility and flow on color Doppler imaging. Profunda Femoral Vein: No evidence of thrombus. Normal compressibility and flow on color Doppler imaging. Femoral Vein: No evidence of thrombus. Normal compressibility, respiratory phasicity and response to augmentation. Popliteal Vein: No evidence of thrombus. Normal compressibility, respiratory phasicity and response to augmentation. Calf Veins: No evidence of thrombus. Normal compressibility and flow on color Doppler imaging. Limited visualization due to edema and patient clinical condition . Superficial Great Saphenous Vein: No evidence of thrombus. Normal compressibility and flow on color Doppler imaging. Other Findings:  None. IMPRESSION: No evidence of DVT within either lower extremity. Electronically Signed   By: Marcello Moores  Register   On: 02/23/2017 12:51   Dg Chest Port 1 View  Result Date: 02/27/2017 CLINICAL DATA:  CHF, COPD, hypertension, former smoker, GERD EXAM: PORTABLE CHEST 1 VIEW COMPARISON:  Portable exam 0747 hours compared to 02/25/2017 FINDINGS: Enlargement of cardiac silhouette with mild pulmonary vascular congestion. Enlarged hila bilaterally. Infiltrates in the perihilar basilar regions bilaterally question improved pulmonary edema. Residual bibasilar atelectasis and question small RIGHT pleural effusion. No pneumothorax or acute osseous findings. IMPRESSION: Improved pulmonary edema with persistent bibasilar atelectasis with small RIGHT pleural effusion. Prominent hila bilaterally, recommend attention on follow-up exams. Electronically  Signed   By: Lavonia Dana M.D.   On: 02/27/2017 08:27   Dg Chest Port 1 View  Result Date: 02/25/2017 CLINICAL DATA:  Respiratory failure. EXAM: PORTABLE CHEST 1 VIEW COMPARISON:  02/23/2017. FINDINGS: Interim extubation removal of NG tube. Right IJ line noted with tip projected over the right atrium. Cardiomegaly with pulmonary venous congestion and bilateral pulmonary infiltrates consistent pulmonary edema. Small bilateral pleural effusions. IMPRESSION: 1. Interim extubation and removal of NG tube. Right IJ line noted with tip projected over the right atrium. 2. Cardiomegaly with bilateral pulmonary venous congestion and pulmonary infiltrates consistent pulmonary edema. Small bilateral pleural effusions. No change from prior exam. Electronically Signed   By: Marcello Moores  Register   On: 02/25/2017 06:48   Dg Chest Port 1 View  Result Date: 02/24/2017 CLINICAL DATA:  Respiratory failure. EXAM: PORTABLE CHEST 1 VIEW COMPARISON:  02/23/2017. FINDINGS: Endotracheal tube, NG tube, right IJ line in stable position. Cardiomegaly with bilateral pulmonary infiltrates/edema and small bilateral pleural effusions. Findings most consistent with congestive heart failure. Slight worsening from prior exam . IMPRESSION: 1. Lines and tubes in stable position. 2. Congestive heart failure pulmonary edema. Slight worsening of pulmonary edema from prior exams. Small bilateral pleural effusions again noted. Electronically Signed   By: Marcello Moores  Register   On: 02/24/2017 07:39   Dg Chest Port 1 View  Result Date: 02/23/2017 CLINICAL DATA:  Acute respiratory failure. EXAM: PORTABLE CHEST 1 VIEW COMPARISON:  02/23/2017 chest radiograph. FINDINGS: Right rotated chest radiograph. Endotracheal tube tip is 2.1 cm above the carina. Right internal jugular central venous catheter terminates at the cavoatrial junction. Enteric tube enters the lower thoracic esophagus, with the tip not seen on this image. Stable cardiomediastinal silhouette with  mild cardiomegaly. No pneumothorax. Small bilateral pleural effusions. Mild-to-moderate pulmonary edema. Bibasilar atelectasis. IMPRESSION: 1. Well-positioned endotracheal tube. Enteric tube enters the lower thoracic esophagus with the tip not seen on this image. 2. Mild-to-moderate congestive heart failure. 3. Small bilateral pleural effusions with bibasilar atelectasis. Electronically Signed   By: Ilona Sorrel M.D.   On: 02/23/2017 19:56   Dg Chest Port 1 View  Result Date: 02/23/2017 CLINICAL DATA:  Shortness of breath. EXAM: PORTABLE CHEST 1 VIEW COMPARISON:  02/22/2017. FINDINGS: Surgical clips right chest. Endotracheal tube and right IJ line stable position. NG tube tip remains projected over the lower esophagus, advancement of approximately 20 cm should be considered. Patchy infiltrates in the right lung have improved. Diffuse left lung infiltrate remains. Basilar atelectasis. No prominent pleural effusion. No pneumothorax. Stable cardiomegaly . IMPRESSION: 1. NG tube tip again noted over the lower esophagus. Advancement of approximately 20 cm should be considered. Endotracheal tube and right IJ line stable position. 2. Partial clearing of patchy infiltrates right lung. Diffuse left lung infiltrate remains. Low lung volumes. 3. Stable cardiomegaly. Critical Value/emergent results were called by telephone at the time of interpretation on 02/23/2017 at 6:44 am to nurse Pamala Hurry , who verbally acknowledged these results. Electronically Signed   By: Marcello Moores  Register   On: 02/23/2017 06:48   Dg Chest Port 1 View  Result Date: 02/22/2017 CLINICAL DATA:  61 year old female post CPR.  Subsequent encounter. EXAM: PORTABLE CHEST 1 VIEW COMPARISON:  02/22/2017 8:52 a.m. FINDINGS: Endotracheal tube tip 3.6 cm above the carina. Endotracheal tube tip mid to distal esophagus level with side hole proximal to mid esophagus level. This needs to be reposition. Right internal jugular catheter tip mid superior vena cava  level. Cardiomegaly. Patchy consolidation most notable right upper lobe, left midlung zone and right perihilar region. Question multifocal pneumonia versus asymmetric pulmonary edema. No gross pneumothorax.  Evaluation limited by overlying structures. No obvious rib fracture. IMPRESSION: Endotracheal tube tip 3.6 cm above the carina. Endotracheal tube tip mid to distal esophagus level with side hole proximal to mid esophagus level. This needs to be reposition. Right internal jugular catheter tip mid superior vena cava level. Cardiomegaly. Patchy consolidation most notable right upper lobe, left midlung zone and right perihilar region. Question multifocal pneumonia versus asymmetric pulmonary edema. No gross pneumothorax.  Evaluation limited by overlying structures. Electronically Signed   By: Genia Del M.D.   On: 02/22/2017 10:52   Dg Chest Portable 1 View  Result Date: 02/22/2017 CLINICAL DATA:  Hypoxia EXAM: PORTABLE CHEST 1 VIEW COMPARISON:  Study obtained earlier in the day. FINDINGS: Endotracheal tube tip is 3.2 cm above the carina. No pneumothorax. There is interstitial pulmonary edema with bibasilar atelectasis. No airspace consolidation. There is cardiomegaly with pulmonary venous hypertension. There is aortic atherosclerosis. No adenopathy. No bone lesions. IMPRESSION: Endotracheal tube as described without pneumothorax. Changes of congestive heart failure. No consolidation. There is aortic atherosclerosis. Aortic Atherosclerosis (ICD10-I70.0). Electronically Signed   By: Lowella Grip III M.D.   On: 02/22/2017 09:06   Dg Chest Portable 1 View  Result Date: 02/22/2017 CLINICAL DATA:  Endotracheal tube placement EXAM: PORTABLE CHEST 1 VIEW COMPARISON:  01/27/2017 FINDINGS: Endotracheal tube in good position.  NG  tip not well seen. Interval worsening of diffuse bilateral airspace disease most prominent right upper lobe. Possible edema versus pneumonia. No pleural effusion. Cardiac enlargement  IMPRESSION: Endotracheal tube in good position. Significant worsening in bilateral airspace disease which may represent pneumonia or edema. Electronically Signed   By: Franchot Gallo M.D.   On: 02/22/2017 08:16   Dg Abd Portable 1v  Result Date: 02/23/2017 CLINICAL DATA:  Nasogastric tube placement EXAM: PORTABLE ABDOMEN - 1 VIEW COMPARISON:  Yesterday FINDINGS: Nasogastric tube tip overlaps the distal stomach. Central line with tip at the upper cavoatrial junction. Chest x-ray was obtained earlier today. Stable bowel gas pattern with mild gaseous distention of right colon and distal stomach. Cholecystectomy clips. Artifact from defibrillator pads. IMPRESSION: Nasogastric tube tip overlaps the  distal stomach. Electronically Signed   By: Monte Fantasia M.D.   On: 02/23/2017 09:25   Dg Abd Portable 1v  Result Date: 02/22/2017 CLINICAL DATA:  Orogastric tube placement EXAM: PORTABLE ABDOMEN - 1 VIEW COMPARISON:  None. FINDINGS: No appreciable orogastric tube. There is mild dilatation in the transverse colon region. No small bowel dilatation. No air-fluid level. No free air evident. There are surgical clips the right upper quadrant. IMPRESSION: No orogastric tube evident. Question a degree of colonic ileus. There does not appear to be bowel obstruction or free air. Electronically Signed   By: Lowella Grip III M.D.   On: 02/22/2017 10:51     CBC  Recent Labs Lab 02/22/17 0755  02/23/17 0445 02/24/17 0403 02/24/17 1702 02/25/17 0501 02/26/17 0827 02/27/17 0449  WBC 16.1*  < > 20.7* 17.5*  --  17.3* 14.2* 15.3*  HGB 8.6*  < > 7.6* 6.5* 7.8* 8.3* 8.6* 8.5*  HCT 30.2*  < > 25.8* 22.4* 25.7* 26.9* 28.2* 27.8*  PLT 373  < > 288 228  --  233 232 254  MCV 72.9*  < > 70.0* 67.9*  --  67.6* 70.1* 68.6*  MCH 20.8*  < > 20.7* 19.9*  --  20.8* 21.2* 21.1*  MCHC 28.5*  < > 29.7* 29.3*  --  30.8* 30.3* 30.7*  RDW 20.7*  < > 20.2* 19.6*  --  20.1* 20.4* 20.9*  LYMPHSABS 2.7  --   --   --   --   --   0.8*  --   MONOABS 1.4*  --   --   --   --   --  1.3*  --   EOSABS 0.2  --   --   --   --   --  0.1  --   BASOSABS 0.2*  --   --   --   --   --  0.1  --   < > = values in this interval not displayed.  Chemistries   Recent Labs Lab 02/22/17 0755  02/22/17 1706  02/23/17 1730 02/24/17 0403 02/25/17 0501 02/26/17 0827 02/27/17 0449  NA 139  < > 137  < > 138 141 141 141 141  K 4.3  < > 5.9*  < > 4.8 4.7 4.1 4.6 4.7  CL 103  < > 105  < > 102 102 100* 100* 99*  CO2 20*  < > 23  < > '27 29 30 ' 32 32  GLUCOSE 356*  < > 422*  < > 116* 115* 175* 185* 137*  BUN 37*  < > 49*  < > 59* 66* 53* 41* 37*  CREATININE 2.11*  < > 2.32*  < > 2.75* 2.77*  1.96* 1.57* 1.60*  CALCIUM 8.6*  < > 7.7*  < > 7.6* 7.7* 8.6* 9.1 9.3  MG  --   --  2.2  --   --   --   --  2.2  --   AST 38  --   --   --   --  29 23  --   --   ALT 17  --   --   --   --  24 21  --   --   ALKPHOS 87  --   --   --   --  58 70  --   --   BILITOT 0.5  --   --   --   --  0.8 1.2  --   --   < > = values in this interval not displayed. ------------------------------------------------------------------------------------------------------------------ estimated creatinine clearance is 54.8 mL/min (A) (by C-G formula based on SCr of 1.6 mg/dL (H)). ------------------------------------------------------------------------------------------------------------------ No results for input(s): HGBA1C in the last 72 hours. ------------------------------------------------------------------------------------------------------------------ No results for input(s): CHOL, HDL, LDLCALC, TRIG, CHOLHDL, LDLDIRECT in the last 72 hours. ------------------------------------------------------------------------------------------------------------------ No results for input(s): TSH, T4TOTAL, T3FREE, THYROIDAB in the last 72 hours.  Invalid input(s):  FREET3 ------------------------------------------------------------------------------------------------------------------ No results for input(s): VITAMINB12, FOLATE, FERRITIN, TIBC, IRON, RETICCTPCT in the last 72 hours.  Coagulation profile  Recent Labs Lab 02/23/17 0445 02/24/17 0403 02/25/17 0501 02/26/17 0827 02/27/17 0449  INR 5.89* 7.14* 3.08 1.44 1.12    No results for input(s): DDIMER in the last 72 hours.  Cardiac Enzymes  Recent Labs Lab 02/22/17 1040 02/22/17 1706 02/22/17 2243  TROPONINI 0.04* 0.08* 0.09*   ------------------------------------------------------------------------------------------------------------------ Invalid input(s): POCBNP    Assessment & Plan   IMPRESSION AND PLAN: Patient is a 61 year old status post respiratory arrest  1. Acute respiratory failure Due to pneumonia and CHF- continue IV cefepime Extubated. Echo in June revealed normal ejection fraction, repeated echocardiogram this admission is pending Pulmonary following, however, patient  remains on 5 L of oxygen  via nasal cannula, give one . More dose of Lasix intravenously today, following creatinine in the morning, oxygenation, Chest x-ray has improved  2. Acute diastolic CHF, give one more dose of intravenous Lasix, following creatinine, oxygenation.   3. Status post cardiac arrest Suspect due to her underlying lung issues  4. Diabetes type 2 continue sliding scale insulin, low glucose levels ranging between 115-230  5. History of hypertension blood pressure stable  6. Hypothyroidism  TSH is suppressed Likely due to euthyroid sick syndrome  7. Morbid obesity, supportive therapy, the patient also has obstructive sleep apnea diagnosis, unclear if she is using CPAP at home  8.Acute renal failure on chronic kidney disease, baseline creatinine of 1.8 in May 2018, estimated GFR of 29, CKD stage IV, nephrology consult appreciated urine output stable renal function  remains stable with diuresis  9. A. fib, RVR, now on lower dose of amiodarone orally, cardiology consultation is appreciated  10 Leukocytosis,no significant improvement at this time yet from yesterday, follow in the morning   11., A. fib, RVR, now in sinus rhythm on amiodarone, continue amiodarone, appreciate cardiology's input      Code Status Orders        Start     Ordered   02/22/17 1004  Full code  Continuous     02/22/17 1003    Code Status History    Date Active Date Inactive Code Status Order ID Comments User Context   01/26/2017  4:36 PM 01/28/2017 12:34  PM Full Code [id number]  Baxter Hire, MD Inpatient           Consults Pulmonary critical care, nephrology  DVT Prophylaxis SCDs  Lab Results  Component Value Date   PLT 254 02/27/2017     Time Spent in minutes 29mn . Discussed with patient's family, Cardiologist   Essie Lagunes M.D on 02/27/2017 at 3:07 PM  Between 7am to 6pm - Pager - [phone]  After 6pm go to [email or web address] - password [email or web address] [facility]   Office  [phone]

## 2017-02-27 NOTE — Progress Notes (Signed)
[facility]  ROUNDING NOTE   Subjective:  Renal function appears to be stable with a creatinine of 1.6. She has been transitioned to floor care.   Objective:  Vital signs in last 24 hours:  Temp:  [97.6 F (36.4 C)-98.5 F (36.9 C)] 98.5 F (36.9 C) (07/22 1148) Pulse Rate:  [57-63] 63 (07/22 1148) Resp:  [18-20] 20 (07/22 1148) BP: (129-144)/(56-86) 130/56 (07/22 1148) SpO2:  [89 %-99 %] 91 % (07/22 1148)  Weight change:  Filed Weights   02/22/17 0813  Weight: (!) 146.1 kg (322 lb)    Intake/Output: I/O last 3 completed shifts: In: 320.4 [P.O.:120; I.V.:200.4] Out: 1302 [Urine:1301; Stool:1]   Intake/Output this shift:  Total I/O In: 120 [P.O.:120] Out: 900 [Urine:900]  Physical Exam: General: Resting in bed  Head: [place]/AT hearing intact OM moist  Eyes: Anicteric  Neck: Supple, trachea midline  Lungs:  Bilateral rhonchi, breath sounds vent assisted   Heart: S1S2 no rubs  Abdomen:  Soft, nontender, bowel sounds present  Extremities: 1+ peripheral edema.  Neurologic: Awake, alert, follows commands  Skin: No lesions       Basic Metabolic Panel:  Recent Labs Lab 02/22/17 1706  02/23/17 1730 02/24/17 0403 02/25/17 0501 02/26/17 0827 02/27/17 0449  NA 137  < > 138 141 141 141 141  K 5.9*  < > 4.8 4.7 4.1 4.6 4.7  CL 105  < > 102 102 100* 100* 99*  CO2 23  < > _0 32 32  GLUCOSE 422*  < > 116* 115* 175* 185* 137*  BUN 49*  < > 59* 66* 53* 41* 37*  CREATININE 2.32*  < > 2.75* 2.77* 1.96* 1.57* 1.60*  CALCIUM 7.7*  < > 7.6* 7.7* 8.6* 9.1 9.3  MG 2.2  --   --   --   --  2.2  --   < > = values in this interval not displayed.  Liver Function Tests:  Recent Labs Lab 02/22/17 0755 02/24/17 0403 02/25/17 0501  AST 38 29 23  ALT _1 ALKPHOS 87 58 70  BILITOT 0.5 0.8 1.2  PROT 7.8 6.4* 7.2  ALBUMIN 3.5 3.0* 3.0*   No results for input(s): LIPASE, AMYLASE in the last 168 hours. No results for input(s): AMMONIA in the last 168  hours.  CBC:  Recent Labs Lab 02/22/17 0755  02/23/17 0445 02/24/17 0403 02/24/17 1702 02/25/17 0501 02/26/17 0827 02/27/17 0449  WBC 16.1*  < > 20.7* 17.5*  --  17.3* 14.2* 15.3*  NEUTROABS 11.6*  --   --   --   --   --  11.9*  --   HGB 8.6*  < > 7.6* 6.5* 7.8* 8.3* 8.6* 8.5*  HCT 30.2*  < > 25.8* 22.4* 25.7* 26.9* 28.2* 27.8*  MCV 72.9*  < > 70.0* 67.9*  --  67.6* 70.1* 68.6*  PLT 373  < > 288 228  --  233 232 254  < > = values in this interval not displayed.  Cardiac Enzymes:  Recent Labs Lab 02/22/17 0755 02/22/17 1040 02/22/17 1706 02/22/17 2243  TROPONINI <0.03 0.04* 0.08* 0.09*    BNP: Invalid input(s): POCBNP  CBG:  Recent Labs Lab 02/26/17 2048 02/27/17 0115 02/27/17 0506 02/27/17 0746 02/27/17 1150  GLUCAP 166* 127* 115* 137* 227*    Microbiology: Results for orders placed or performed during the hospital encounter of 02/22/17  Blood Culture (routine x 2)     Status: None  Collection Time: 02/22/17  7:55 AM  Result Value Ref Range Status   Specimen Description BLOOD RIGHT AV  Final   Special Requests   Final    BOTTLES DRAWN AEROBIC AND ANAEROBIC Blood Culture results may not be optimal due to an excessive volume of blood received in culture bottles   Culture NO GROWTH 5 DAYS  Final   Report Status 02/27/2017 FINAL  Final  Blood Culture (routine x 2)     Status: None   Collection Time: 02/22/17  8:15 AM  Result Value Ref Range Status   Specimen Description BLOOD RIGHT HAND  Final   Special Requests   Final    BOTTLES DRAWN AEROBIC AND ANAEROBIC Blood Culture results may not be optimal due to an excessive volume of blood received in culture bottles   Culture NO GROWTH 5 DAYS  Final   Report Status 02/27/2017 FINAL  Final  Culture, respiratory (NON-Expectorated)     Status: None   Collection Time: 02/22/17 10:00 AM  Result Value Ref Range Status   Specimen Description TRACHEAL ASPIRATE  Final   Special Requests Normal  Final   Gram Stain    Final    FEW WBC PRESENT,BOTH PMN AND MONONUCLEAR NO ORGANISMS SEEN    Culture   Final    Consistent with normal respiratory flora. Performed at [facility], [place] [address] [place], [place] [address]    Report Status 02/25/2017 FINAL  Final  MRSA PCR Screening     Status: None   Collection Time: 02/22/17 10:50 AM  Result Value Ref Range Status   MRSA by PCR NEGATIVE NEGATIVE Final    Comment:        The GeneXpert MRSA Assay (FDA approved for NASAL specimens only), is one component of a comprehensive MRSA colonization surveillance program. It is not intended to diagnose MRSA infection nor to guide or monitor treatment for MRSA infections.   Urine culture     Status: None   Collection Time: 02/22/17 11:26 AM  Result Value Ref Range Status   Specimen Description URINE, CATHETERIZED  Final   Special Requests NONE  Final   Culture   Final    NO GROWTH Performed at [facility], [address], [place], [place] [address]    Report Status 02/24/2017 FINAL  Final    Coagulation Studies:  Recent Labs  02/25/17 0501 02/26/17 0827 02/27/17 0449  LABPROT 32.5* 17.7* 14.5  INR 3.08 1.44 1.12    Urinalysis: No results for input(s): COLORURINE, LABSPEC, PHURINE, GLUCOSEU, HGBUR, BILIRUBINUR, KETONESUR, PROTEINUR, UROBILINOGEN, NITRITE, LEUKOCYTESUR in the last 72 hours.  Invalid input(s): APPERANCEUR    Imaging: Dg Chest Port 1 View  Result Date: 02/27/2017 CLINICAL DATA:  CHF, COPD, hypertension, former smoker, GERD EXAM: PORTABLE CHEST 1 VIEW COMPARISON:  Portable exam 0747 hours compared to 02/25/2017 FINDINGS: Enlargement of cardiac silhouette with mild pulmonary vascular congestion. Enlarged hila bilaterally. Infiltrates in the perihilar basilar regions bilaterally question improved pulmonary edema. Residual bibasilar atelectasis and question small RIGHT pleural effusion. No pneumothorax or acute osseous findings. IMPRESSION: Improved pulmonary edema with  persistent bibasilar atelectasis with small RIGHT pleural effusion. Prominent hila bilaterally, recommend attention on follow-up exams. Electronically Signed   By: Lavonia Dana M.D.   On: 02/27/2017 08:27     Medications:   . norepinephrine (LEVOPHED) Adult infusion Stopped (02/24/17 0904)   . [START ON 02/28/2017] amiodarone  400 mg Oral Daily  . chlorhexidine gluconate (MEDLINE KIT)  15  mL Mouth Rinse BID  . citalopram  20 mg Oral Daily  . insulin aspart  0-15 Units Subcutaneous Q4H  . insulin glargine  20 Units Subcutaneous QHS  . loratadine  10 mg Oral Daily  . mouth rinse  15 mL Mouth Rinse BID  . metoprolol tartrate  25 mg Oral Q6H  . simethicone  80 mg Oral QID  . warfarin  6 mg Oral ONCE-1800  . Warfarin - Pharmacist Dosing Inpatient   Does not apply q1800   acetaminophen **OR** [DISCONTINUED] acetaminophen, [DISCONTINUED] ondansetron **OR** ondansetron (ZOFRAN) IV, oxyCODONE, simethicone, sodium chloride flush  Assessment/ Plan:  61 y.o. female with past medical history of hypothyroidism, atrial fibrillation, diabetes mellitus type 2, hypertension, obesity, GERD, osteoporosis, asthma, allergic rhinitis, chronic back pain, hyperlipidemia, B12 deficiency, and obstructive sleep apnea who was admitted with PEA arrest.    1. Acute renal failure/Chronic kidney disease stage IV/proteinuria/diabetes mellitus type 2 with chronic kidney disease/history of NSAID use.   - renal function 1.6. Continue to monitor renal funcon trend daily f  2. Hyperkalemia. Potassium now normalized at 4.7. Continue to monitor.  3. Acute respiratory failure/PEA arrest. Extubated 02/24/17.  Patient breathing comfortably at the moment. Continue to monitor respiratory status.  LOS: 5 Kimo Bancroft 7/22/20183:04 PM

## 2017-02-28 LAB — BLOOD GAS, ARTERIAL
Acid-Base Excess: 5.5 mmol/L — ABNORMAL HIGH (ref 0.0–2.0)
Bicarbonate: 31.5 mmol/L — ABNORMAL HIGH (ref 20.0–28.0)
FIO2: 0.4
MECHANICAL RATE: 18
O2 Saturation: 97.1 %
PATIENT TEMPERATURE: 37
PCO2 ART: 52 mmHg — AB (ref 32.0–48.0)
PEEP: 5 cmH2O
PO2 ART: 92 mmHg (ref 83.0–108.0)
VT: 500 mL
pH, Arterial: 7.39 (ref 7.350–7.450)

## 2017-02-28 LAB — CBC
HEMATOCRIT: 26.6 % — AB (ref 35.0–47.0)
HEMOGLOBIN: 8.1 g/dL — AB (ref 12.0–16.0)
MCH: 21.3 pg — ABNORMAL LOW (ref 26.0–34.0)
MCHC: 30.6 g/dL — AB (ref 32.0–36.0)
MCV: 69.6 fL — ABNORMAL LOW (ref 80.0–100.0)
Platelets: 259 10*3/uL (ref 150–440)
RBC: 3.82 MIL/uL (ref 3.80–5.20)
RDW: 20.6 % — ABNORMAL HIGH (ref 11.5–14.5)
WBC: 14.1 10*3/uL — AB (ref 3.6–11.0)

## 2017-02-28 LAB — GLUCOSE, CAPILLARY
GLUCOSE-CAPILLARY: 156 mg/dL — AB (ref 65–99)
GLUCOSE-CAPILLARY: 193 mg/dL — AB (ref 65–99)
GLUCOSE-CAPILLARY: 193 mg/dL — AB (ref 65–99)
Glucose-Capillary: 136 mg/dL — ABNORMAL HIGH (ref 65–99)
Glucose-Capillary: 131 mg/dL — ABNORMAL HIGH (ref 65–99)

## 2017-02-28 LAB — PROTIME-INR
INR: 1.13
PROTHROMBIN TIME: 14.6 s (ref 11.4–15.2)

## 2017-02-28 LAB — BLOOD GAS, VENOUS
PATIENT TEMPERATURE: 37
PCO2 VEN: 106 mmHg — AB (ref 44.0–60.0)

## 2017-02-28 LAB — CREATININE, SERUM
Creatinine, Ser: 1.56 mg/dL — ABNORMAL HIGH (ref 0.44–1.00)
GFR, EST AFRICAN AMERICAN: 40 mL/min — AB (ref >60)
GFR, EST NON AFRICAN AMERICAN: 35 mL/min — AB (ref >60)

## 2017-02-28 LAB — POTASSIUM: Potassium: 4.1 mmol/L (ref 3.5–5.1)

## 2017-02-28 MED ORDER — FUROSEMIDE 10 MG/ML IJ SOLN
40.0000 mg | Freq: Every day | INTRAMUSCULAR | Status: DC
  Administered 2017-02-28 – 2017-03-03 (×4): 40 mg via INTRAVENOUS
  Filled 2017-02-28 (×3): qty 4

## 2017-02-28 MED ORDER — SODIUM CHLORIDE 0.9% FLUSH
3.0000 mL | Freq: Two times a day (BID) | INTRAVENOUS | Status: DC
  Administered 2017-02-28 – 2017-03-03 (×4): 3 mL via INTRAVENOUS

## 2017-02-28 MED ORDER — INSULIN ASPART 100 UNIT/ML ~~LOC~~ SOLN
0.0000 [IU] | Freq: Every day | SUBCUTANEOUS | Status: DC
  Administered 2017-03-01: 2 [IU] via SUBCUTANEOUS
  Administered 2017-03-02: 3 [IU] via SUBCUTANEOUS
  Filled 2017-02-28 (×2): qty 1

## 2017-02-28 MED ORDER — INSULIN ASPART 100 UNIT/ML ~~LOC~~ SOLN
0.0000 [IU] | Freq: Three times a day (TID) | SUBCUTANEOUS | Status: DC
  Administered 2017-03-01: 3 [IU] via SUBCUTANEOUS
  Administered 2017-03-01: 7 [IU] via SUBCUTANEOUS
  Administered 2017-03-01 – 2017-03-02 (×3): 3 [IU] via SUBCUTANEOUS
  Administered 2017-03-02 – 2017-03-03 (×2): 2 [IU] via SUBCUTANEOUS
  Administered 2017-03-03: 3 [IU] via SUBCUTANEOUS
  Filled 2017-02-28 (×8): qty 1

## 2017-02-28 MED ORDER — WARFARIN SODIUM 7.5 MG PO TABS
7.5000 mg | ORAL_TABLET | Freq: Once | ORAL | Status: AC
  Administered 2017-02-28: 7.5 mg via ORAL
  Filled 2017-02-28: qty 1

## 2017-02-28 NOTE — Progress Notes (Signed)
[name]                                                                                                                                                                                  Patient Demographics   Andrea Rivers, is a 61 y.o. female, DOB - 12/10/1955, GNO:[id number]  Admit date - 02/22/2017   Admitting Physician Dustin Flock, MD  Outpatient Primary MD for the patient is Gauger, Andrea Lain, NP   LOS - 6  Subjective: The patient remains in sinus rhythm on high dose of amiodarone orally . Diuresed Over the past few days with 40 mg of Lasix, remains on 4 L of oxygen with better oxygen, saturation .  Chest x-ray shows improvement in congestive heart failure Patient is  some bradycardic, but  feels overall better today ,  out of bed to chair  Review of Systems:     CONSTITUTIONAL: No documented fever. No fatigue, weakness. No weight gain, no weight loss.  EYES: No blurry or double vision.  ENT: No tinnitus. No postnasal drip. No redness of the oropharynx.  RESPIRATORY: No cough, no wheeze, no hemoptysis. No dyspnea.  CARDIOVASCULAR: No chest pain. No orthopnea. No palpitations. No syncope.  GASTROINTESTINAL: No nausea, no vomiting or diarrhea. No abdominal pain. No melena or hematochezia.  GENITOURINARY:  No urgency. No frequency. No dysuria. No hematuria. No obstructive symptoms. No discharge. No pain. No significant abnormal bleeding ENDOCRINE: No polyuria or nocturia. No heat or cold intolerance.  HEMATOLOGY: No anemia. No bruising. No bleeding. No purpura. No petechiae INTEGUMENTARY: No rashes. No lesions.  MUSCULOSKELETAL: Complains of pain in the right arm has a history of bursitis NEUROLOGIC: No numbness, tingling, or ataxia. No seizure-type activity.  PSYCHIATRIC: No anxiety. No insomnia. No ADD.       Vitals:   Vitals:   02/27/17 1148 02/27/17 2015 02/28/17 0510 02/28/17 0800  BP: (!) 130/56 (!) 135/57 (!) 120/52  (!) 124/56  Pulse: 63 60 (!) 59 (!) 59  Resp: '20 18 18 18  ' Temp: 98.5 F (36.9 C) 98.6 F (37 C) 98.3 F (36.8 C) 98.5 F (36.9 C)  TempSrc: Oral Oral Oral Oral  SpO2: 91% 99% 95% 95%  Weight:      Height:        Wt Readings from Last 3 Encounters:  02/22/17 (!) 146.1 kg (322 lb)  01/26/17 135.8 kg (299 lb 6.2 oz)     Intake/Output Summary (Last 24 hours) at 02/28/17 1255 Last data filed at 02/28/17 1020  Gross per 24 hour  Intake              480 ml  Output              700 ml  Net             -220 ml    Physical Exam:   GENERAL: Critically ill-appearing, Obese HEAD, EYES, EARS, NOSE AND THROAT: Atraumatic, normocephalic.. Pupils equal and reactive to light. Sclerae anicteric. No conjunctival injection. No oro-pharyngeal erythema.  NECK: Supple. There is no jugular venous distention. No bruits, no lymphadenopathy, no thyromegaly.  HEART: Rhythm was regular  No murmurs, no rubs, no clicks.  LUNGS:  Diminished breath sounds bilaterally at bases, few scattered crackles   ABDOMEN: Soft, flat, nontender, nondistended. Has good bowel sounds. No hepatosplenomegaly appreciated.  EXTREMITIES: No evidence of any cyanosis, clubbing, Trace peripheral edema.  +2 pedal and radial pulses bilaterally.  NEUROLOGIC: Sedated SKIN: Moist and warm with no rashes appreciated.  Psych: Sedated  LN: No inguinal LN enlargement    Antibiotics   Anti-infectives    Start     Dose/Rate Route Frequency Ordered Stop   02/22/17 1500  ceFEPIme (MAXIPIME) 2 g in dextrose 5 % 50 mL IVPB  Status:  Discontinued     2 g 100 mL/hr over 30 Minutes Intravenous Every 12 hours 02/22/17 1127 02/24/17 1111   02/22/17 1400  vancomycin (VANCOCIN) 1,250 mg in sodium chloride 0.9 % 250 mL IVPB  Status:  Discontinued     1,250 mg 166.7 mL/hr over 90 Minutes Intravenous Every 18 hours 02/22/17 0842 02/23/17 0839   02/22/17 1200  azithromycin (ZITHROMAX) 500 mg in dextrose 5 % 250 mL IVPB  Status:  Discontinued      500 mg 250 mL/hr over 60 Minutes Intravenous Every 24 hours 02/22/17 1113 02/25/17 1054   02/22/17 0845  piperacillin-tazobactam (ZOSYN) IVPB 4.5 g  Status:  Discontinued     4.5 g 200 mL/hr over 30 Minutes Intravenous Every 8 hours 02/22/17 0842 02/22/17 1113   02/22/17 0830  vancomycin (VANCOCIN) IVPB 1000 mg/200 mL premix     1,000 mg 200 mL/hr over 60 Minutes Intravenous  Once 02/22/17 0823 02/22/17 1030   02/22/17 0815  vancomycin (VANCOCIN) injection 2,000 mg  Status:  Discontinued     2,000 mg Intravenous  Once 02/22/17 0804 02/22/17 0823   02/22/17 0815  piperacillin-tazobactam (ZOSYN) IVPB 3.375 g     3.375 g 100 mL/hr over 30 Minutes Intravenous  Once 02/22/17 0804 02/22/17 0925      Medications   Scheduled Meds: . amiodarone  400 mg Oral Daily  . chlorhexidine gluconate (MEDLINE KIT)  15 mL Mouth Rinse BID  . citalopram  20 mg Oral Daily  . furosemide  40 mg Intravenous Daily  . insulin aspart  0-15 Units Subcutaneous Q4H  . insulin glargine  20 Units Subcutaneous QHS  . loratadine  10 mg Oral Daily  . mouth rinse  15 mL Mouth Rinse BID  . metoprolol tartrate  25 mg Oral Q6H  . simethicone  80 mg Oral QID  . warfarin  7.5 mg Oral ONCE-1800  . Warfarin - Pharmacist Dosing Inpatient   Does not apply q1800   Continuous Infusions:  PRN Meds:.acetaminophen **OR** [DISCONTINUED] acetaminophen, [DISCONTINUED] ondansetron **OR** ondansetron (ZOFRAN) IV, oxyCODONE, simethicone, sodium chloride flush   Data Review:   Micro Results Recent Results (from the past 240 hour(s))  Blood Culture (routine x 2)     Status: None   Collection Time: 02/22/17  7:55 AM  Result Value Ref Range Status   Specimen Description BLOOD RIGHT  AV  Final   Special Requests   Final    BOTTLES DRAWN AEROBIC AND ANAEROBIC Blood Culture results may not be optimal due to an excessive volume of blood received in culture bottles   Culture NO GROWTH 5 DAYS  Final   Report Status 02/27/2017 FINAL   Final  Blood Culture (routine x 2)     Status: None   Collection Time: 02/22/17  8:15 AM  Result Value Ref Range Status   Specimen Description BLOOD RIGHT HAND  Final   Special Requests   Final    BOTTLES DRAWN AEROBIC AND ANAEROBIC Blood Culture results may not be optimal due to an excessive volume of blood received in culture bottles   Culture NO GROWTH 5 DAYS  Final   Report Status 02/27/2017 FINAL  Final  Culture, respiratory (NON-Expectorated)     Status: None   Collection Time: 02/22/17 10:00 AM  Result Value Ref Range Status   Specimen Description TRACHEAL ASPIRATE  Final   Special Requests Normal  Final   Gram Stain   Final    FEW WBC PRESENT,BOTH PMN AND MONONUCLEAR NO ORGANISMS SEEN    Culture   Final    Consistent with normal respiratory flora. Performed at [facility], [place] [address] [place], [place] [address]    Report Status 02/25/2017 FINAL  Final  MRSA PCR Screening     Status: None   Collection Time: 02/22/17 10:50 AM  Result Value Ref Range Status   MRSA by PCR NEGATIVE NEGATIVE Final    Comment:        The GeneXpert MRSA Assay (FDA approved for NASAL specimens only), is one component of a comprehensive MRSA colonization surveillance program. It is not intended to diagnose MRSA infection nor to guide or monitor treatment for MRSA infections.   Urine culture     Status: None   Collection Time: 02/22/17 11:26 AM  Result Value Ref Range Status   Specimen Description URINE, CATHETERIZED  Final   Special Requests NONE  Final   Culture   Final    NO GROWTH Performed at [facility], [address] [place],  [address]    Report Status 02/24/2017 FINAL  Final    Radiology Reports Dg Abd 1 View  Result Date: 02/24/2017 CLINICAL DATA:  NG tube placement EXAM: ABDOMEN - 1 VIEW COMPARISON:  02/23/2017 FINDINGS: The tip and side port of a gastric tube are noted in the right upper quadrant presumably in the distal stomach. The patient is  slightly rotated on current exam. Cardiomegaly is noted with central line catheter tip projecting over the cavoatrial junction. IMPRESSION: 1. Gastric tube in the right upper quadrant suggesting a distal gastric position of the gastric tube tip. 2. Cardiomegaly with central line catheter in the expected location of the cavoatrial junction. Electronically Signed   By: Ashley Royalty M.D.   On: 02/24/2017 01:59   US Venous Img Lower Bilateral  Result Date: 02/23/2017 CLINICAL DATA:  Bilateral lower extremity edema. EXAM: BILATERAL LOWER EXTREMITY VENOUS DOPPLER ULTRASOUND TECHNIQUE: Gray-scale sonography with graded compression, as well as color Doppler and duplex ultrasound were performed to evaluate the lower extremity deep venous systems from the level of the common femoral vein and including the common femoral, femoral, profunda femoral, popliteal and calf veins including the posterior tibial, peroneal and gastrocnemius veins when visible. The superficial great saphenous vein was also interrogated. Spectral Doppler was utilized to evaluate flow at rest and with distal  augmentation maneuvers in the common femoral, femoral and popliteal veins. COMPARISON:  Ultrasound 06/13/2007. FINDINGS: RIGHT LOWER EXTREMITY Common Femoral Vein: No evidence of thrombus. Normal compressibility, respiratory phasicity and response to augmentation. Saphenofemoral Junction: No evidence of thrombus. Normal compressibility and flow on color Doppler imaging. Profunda Femoral Vein: No evidence of thrombus. Normal compressibility and flow on color Doppler imaging. Femoral Vein: No evidence of thrombus. Normal compressibility, respiratory phasicity and response to augmentation. Popliteal Vein: No evidence of thrombus. Normal compressibility, respiratory phasicity and response to augmentation. Calf Veins: No evidence of thrombus. Normal compressibility and flow on color Doppler imaging. Limited visualization due to edema and patient clinical  condition . Superficial Great Saphenous Vein: No evidence of thrombus. Normal compressibility and flow on color Doppler imaging. Other Findings:  None. LEFT LOWER EXTREMITY Common Femoral Vein: No evidence of thrombus. Normal compressibility, respiratory phasicity and response to augmentation. Saphenofemoral Junction: No evidence of thrombus. Normal compressibility and flow on color Doppler imaging. Profunda Femoral Vein: No evidence of thrombus. Normal compressibility and flow on color Doppler imaging. Femoral Vein: No evidence of thrombus. Normal compressibility, respiratory phasicity and response to augmentation. Popliteal Vein: No evidence of thrombus. Normal compressibility, respiratory phasicity and response to augmentation. Calf Veins: No evidence of thrombus. Normal compressibility and flow on color Doppler imaging. Limited visualization due to edema and patient clinical condition . Superficial Great Saphenous Vein: No evidence of thrombus. Normal compressibility and flow on color Doppler imaging. Other Findings:  None. IMPRESSION: No evidence of DVT within either lower extremity. Electronically Signed   By: Marcello Moores  Register   On: 02/23/2017 12:51   Dg Chest Port 1 View  Result Date: 02/27/2017 CLINICAL DATA:  CHF, COPD, hypertension, former smoker, GERD EXAM: PORTABLE CHEST 1 VIEW COMPARISON:  Portable exam 0747 hours compared to 02/25/2017 FINDINGS: Enlargement of cardiac silhouette with mild pulmonary vascular congestion. Enlarged hila bilaterally. Infiltrates in the perihilar basilar regions bilaterally question improved pulmonary edema. Residual bibasilar atelectasis and question small RIGHT pleural effusion. No pneumothorax or acute osseous findings. IMPRESSION: Improved pulmonary edema with persistent bibasilar atelectasis with small RIGHT pleural effusion. Prominent hila bilaterally, recommend attention on follow-up exams. Electronically Signed   By: Lavonia Dana M.D.   On: 02/27/2017 08:27   Dg  Chest Port 1 View  Result Date: 02/25/2017 CLINICAL DATA:  Respiratory failure. EXAM: PORTABLE CHEST 1 VIEW COMPARISON:  02/23/2017. FINDINGS: Interim extubation removal of NG tube. Right IJ line noted with tip projected over the right atrium. Cardiomegaly with pulmonary venous congestion and bilateral pulmonary infiltrates consistent pulmonary edema. Small bilateral pleural effusions. IMPRESSION: 1. Interim extubation and removal of NG tube. Right IJ line noted with tip projected over the right atrium. 2. Cardiomegaly with bilateral pulmonary venous congestion and pulmonary infiltrates consistent pulmonary edema. Small bilateral pleural effusions. No change from prior exam. Electronically Signed   By: Marcello Moores  Register   On: 02/25/2017 06:48   Dg Chest Port 1 View  Result Date: 02/24/2017 CLINICAL DATA:  Respiratory failure. EXAM: PORTABLE CHEST 1 VIEW COMPARISON:  02/23/2017. FINDINGS: Endotracheal tube, NG tube, right IJ line in stable position. Cardiomegaly with bilateral pulmonary infiltrates/edema and small bilateral pleural effusions. Findings most consistent with congestive heart failure. Slight worsening from prior exam . IMPRESSION: 1. Lines and tubes in stable position. 2. Congestive heart failure pulmonary edema. Slight worsening of pulmonary edema from prior exams. Small bilateral pleural effusions again noted. Electronically Signed   By: Marcello Moores  Register   On: 02/24/2017 07:39   Dg  Chest Port 1 View  Result Date: 02/23/2017 CLINICAL DATA:  Acute respiratory failure. EXAM: PORTABLE CHEST 1 VIEW COMPARISON:  02/23/2017 chest radiograph. FINDINGS: Right rotated chest radiograph. Endotracheal tube tip is 2.1 cm above the carina. Right internal jugular central venous catheter terminates at the cavoatrial junction. Enteric tube enters the lower thoracic esophagus, with the tip not seen on this image. Stable cardiomediastinal silhouette with mild cardiomegaly. No pneumothorax. Small bilateral pleural  effusions. Mild-to-moderate pulmonary edema. Bibasilar atelectasis. IMPRESSION: 1. Well-positioned endotracheal tube. Enteric tube enters the lower thoracic esophagus with the tip not seen on this image. 2. Mild-to-moderate congestive heart failure. 3. Small bilateral pleural effusions with bibasilar atelectasis. Electronically Signed   By: Ilona Sorrel M.D.   On: 02/23/2017 19:56   Dg Chest Port 1 View  Result Date: 02/23/2017 CLINICAL DATA:  Shortness of breath. EXAM: PORTABLE CHEST 1 VIEW COMPARISON:  02/22/2017. FINDINGS: Surgical clips right chest. Endotracheal tube and right IJ line stable position. NG tube tip remains projected over the lower esophagus, advancement of approximately 20 cm should be considered. Patchy infiltrates in the right lung have improved. Diffuse left lung infiltrate remains. Basilar atelectasis. No prominent pleural effusion. No pneumothorax. Stable cardiomegaly . IMPRESSION: 1. NG tube tip again noted over the lower esophagus. Advancement of approximately 20 cm should be considered. Endotracheal tube and right IJ line stable position. 2. Partial clearing of patchy infiltrates right lung. Diffuse left lung infiltrate remains. Low lung volumes. 3. Stable cardiomegaly. Critical Value/emergent results were called by telephone at the time of interpretation on 02/23/2017 at 6:44 am to nurse Pamala Hurry , who verbally acknowledged these results. Electronically Signed   By: Marcello Moores  Register   On: 02/23/2017 06:48   Dg Chest Port 1 View  Result Date: 02/22/2017 CLINICAL DATA:  61 year old female post CPR.  Subsequent encounter. EXAM: PORTABLE CHEST 1 VIEW COMPARISON:  02/22/2017 8:52 a.m. FINDINGS: Endotracheal tube tip 3.6 cm above the carina. Endotracheal tube tip mid to distal esophagus level with side hole proximal to mid esophagus level. This needs to be reposition. Right internal jugular catheter tip mid superior vena cava level. Cardiomegaly. Patchy consolidation most notable right  upper lobe, left midlung zone and right perihilar region. Question multifocal pneumonia versus asymmetric pulmonary edema. No gross pneumothorax.  Evaluation limited by overlying structures. No obvious rib fracture. IMPRESSION: Endotracheal tube tip 3.6 cm above the carina. Endotracheal tube tip mid to distal esophagus level with side hole proximal to mid esophagus level. This needs to be reposition. Right internal jugular catheter tip mid superior vena cava level. Cardiomegaly. Patchy consolidation most notable right upper lobe, left midlung zone and right perihilar region. Question multifocal pneumonia versus asymmetric pulmonary edema. No gross pneumothorax.  Evaluation limited by overlying structures. Electronically Signed   By: Genia Del M.D.   On: 02/22/2017 10:52   Dg Chest Portable 1 View  Result Date: 02/22/2017 CLINICAL DATA:  Hypoxia EXAM: PORTABLE CHEST 1 VIEW COMPARISON:  Study obtained earlier in the day. FINDINGS: Endotracheal tube tip is 3.2 cm above the carina. No pneumothorax. There is interstitial pulmonary edema with bibasilar atelectasis. No airspace consolidation. There is cardiomegaly with pulmonary venous hypertension. There is aortic atherosclerosis. No adenopathy. No bone lesions. IMPRESSION: Endotracheal tube as described without pneumothorax. Changes of congestive heart failure. No consolidation. There is aortic atherosclerosis. Aortic Atherosclerosis (ICD10-I70.0). Electronically Signed   By: Lowella Grip III M.D.   On: 02/22/2017 09:06   Dg Chest Portable 1 View  Result Date: 02/22/2017 CLINICAL  DATA:  Endotracheal tube placement EXAM: PORTABLE CHEST 1 VIEW COMPARISON:  01/27/2017 FINDINGS: Endotracheal tube in good position.  NG tip not well seen. Interval worsening of diffuse bilateral airspace disease most prominent right upper lobe. Possible edema versus pneumonia. No pleural effusion. Cardiac enlargement IMPRESSION: Endotracheal tube in good position. Significant  worsening in bilateral airspace disease which may represent pneumonia or edema. Electronically Signed   By: Franchot Gallo M.D.   On: 02/22/2017 08:16   Dg Abd Portable 1v  Result Date: 02/23/2017 CLINICAL DATA:  Nasogastric tube placement EXAM: PORTABLE ABDOMEN - 1 VIEW COMPARISON:  Yesterday FINDINGS: Nasogastric tube tip overlaps the distal stomach. Central line with tip at the upper cavoatrial junction. Chest x-ray was obtained earlier today. Stable bowel gas pattern with mild gaseous distention of right colon and distal stomach. Cholecystectomy clips. Artifact from defibrillator pads. IMPRESSION: Nasogastric tube tip overlaps the  distal stomach. Electronically Signed   By: Monte Fantasia M.D.   On: 02/23/2017 09:25   Dg Abd Portable 1v  Result Date: 02/22/2017 CLINICAL DATA:  Orogastric tube placement EXAM: PORTABLE ABDOMEN - 1 VIEW COMPARISON:  None. FINDINGS: No appreciable orogastric tube. There is mild dilatation in the transverse colon region. No small bowel dilatation. No air-fluid level. No free air evident. There are surgical clips the right upper quadrant. IMPRESSION: No orogastric tube evident. Question a degree of colonic ileus. There does not appear to be bowel obstruction or free air. Electronically Signed   By: Lowella Grip III M.D.   On: 02/22/2017 10:51     CBC  Recent Labs Lab 02/22/17 0755  02/24/17 0403 02/24/17 1702 02/25/17 0501 02/26/17 0827 02/27/17 0449 02/28/17 0339  WBC 16.1*  < > 17.5*  --  17.3* 14.2* 15.3* 14.1*  HGB 8.6*  < > 6.5* 7.8* 8.3* 8.6* 8.5* 8.1*  HCT 30.2*  < > 22.4* 25.7* 26.9* 28.2* 27.8* 26.6*  PLT 373  < > 228  --  233 232 254 259  MCV 72.9*  < > 67.9*  --  67.6* 70.1* 68.6* 69.6*  MCH 20.8*  < > 19.9*  --  20.8* 21.2* 21.1* 21.3*  MCHC 28.5*  < > 29.3*  --  30.8* 30.3* 30.7* 30.6*  RDW 20.7*  < > 19.6*  --  20.1* 20.4* 20.9* 20.6*  LYMPHSABS 2.7  --   --   --   --  0.8*  --   --   MONOABS 1.4*  --   --   --   --  1.3*  --   --    EOSABS 0.2  --   --   --   --  0.1  --   --   BASOSABS 0.2*  --   --   --   --  0.1  --   --   < > = values in this interval not displayed.  Chemistries   Recent Labs Lab 02/22/17 0755  02/22/17 1706  02/23/17 1730 02/24/17 0403 02/25/17 0501 02/26/17 0827 02/27/17 0449 02/28/17 0339  NA 139  < > 137  < > 138 141 141 141 141  --   K 4.3  < > 5.9*  < > 4.8 4.7 4.1 4.6 4.7 4.1  CL 103  < > 105  < > 102 102 100* 100* 99*  --   CO2 20*  < > 23  < > '27 29 30 ' 32 32  --   GLUCOSE 356*  < > 422*  < >  116* 115* 175* 185* 137*  --   BUN 37*  < > 49*  < > 59* 66* 53* 41* 37*  --   CREATININE 2.11*  < > 2.32*  < > 2.75* 2.77* 1.96* 1.57* 1.60* 1.56*  CALCIUM 8.6*  < > 7.7*  < > 7.6* 7.7* 8.6* 9.1 9.3  --   MG  --   --  2.2  --   --   --   --  2.2  --   --   AST 38  --   --   --   --  29 23  --   --   --   ALT 17  --   --   --   --  24 21  --   --   --   ALKPHOS 87  --   --   --   --  58 70  --   --   --   BILITOT 0.5  --   --   --   --  0.8 1.2  --   --   --   < > = values in this interval not displayed. ------------------------------------------------------------------------------------------------------------------ estimated creatinine clearance is 56.2 mL/min (A) (by C-G formula based on SCr of 1.56 mg/dL (H)). ------------------------------------------------------------------------------------------------------------------ No results for input(s): HGBA1C in the last 72 hours. ------------------------------------------------------------------------------------------------------------------ No results for input(s): CHOL, HDL, LDLCALC, TRIG, CHOLHDL, LDLDIRECT in the last 72 hours. ------------------------------------------------------------------------------------------------------------------ No results for input(s): TSH, T4TOTAL, T3FREE, THYROIDAB in the last 72 hours.  Invalid input(s):  FREET3 ------------------------------------------------------------------------------------------------------------------ No results for input(s): VITAMINB12, FOLATE, FERRITIN, TIBC, IRON, RETICCTPCT in the last 72 hours.  Coagulation profile  Recent Labs Lab 02/24/17 0403 02/25/17 0501 02/26/17 0827 02/27/17 0449 02/28/17 0339  INR 7.14* 3.08 1.44 1.12 1.13    No results for input(s): DDIMER in the last 72 hours.  Cardiac Enzymes  Recent Labs Lab 02/22/17 1040 02/22/17 1706 02/22/17 2243  TROPONINI 0.04* 0.08* 0.09*   ------------------------------------------------------------------------------------------------------------------ Invalid input(s): POCBNP    Assessment & Plan   IMPRESSION AND PLAN: Patient is a 61 year old status post respiratory arrest  1. Acute respiratory failure with hypoxia  Due to pneumonia and CHF- continue IV cefepime Extubated. Echo in June revealed normal ejection fraction Pulmonary following, patient  remains on 4 L of oxygen  via nasal cannula, continue 40 mg of  Lasix intravenously daily, following creatinine , oxygenation, Chest x-ray has improved, as well as oxygenation  2. Acute diastolic CHF,  continue  intravenous Lasix, following creatinine, oxygenation.   3. Status post cardiac arrest Suspect due to her underlying lung issues?   4. Diabetes type 2 continue sliding scale insulin, low glucose levels ranging between  136 to 230s  5. History of hypertension blood pressure stable  6. Hypothyroidism  TSH is suppressed Likely due to euthyroid sick syndrome  7. Morbid obesity, supportive therapy, the patient also has obstructive sleep apnea diagnosis, unclear if she is using CPAP at home  8.Acute renal failure on chronic kidney disease, baseline creatinine of 1.8 in May 2018, estimated GFR of 29, CKD stage IV, nephrology consult appreciated urine output stable renal function remains stable with diuresis  9. A. fib, RVR,  now on sinus rhythm, continue current dose of  amiodarone orally, cardiology consultation is appreciated  10 Leukocytosis,no significant improvement at this time yet from yesterday, follow  closely  11., A. fib, RVR, now in sinus rhythm on amiodarone, continue amiodarone, appreciate cardiology's input      Code  Status Orders        Start     Ordered   02/22/17 1004  Full code  Continuous     02/22/17 1003    Code Status History    Date Active Date Inactive Code Status Order ID Comments User Context   01/26/2017  4:36 PM 01/28/2017 12:34 PM Full Code [id number]  Baxter Hire, MD Inpatient           Consults Pulmonary critical care, nephrology  DVT Prophylaxis SCDs  Lab Results  Component Value Date   PLT 259 02/28/2017     Time Spent in minutes 38mn . Discussed with patient's family   Bernal Luhman M.D on 02/28/2017 at 12:55 PM  Between 7am to 6pm - Pager - [phone]  After 6pm go to [email or web address] - password [email or web address]   Office  [phone]

## 2017-02-28 NOTE — Progress Notes (Signed)
ANTICOAGULATION CONSULT NOTE - Follow Up Consult  Pharmacy Consult for warfarin dosing Indication: atrial fibrillation  Allergies  Allergen Reactions  . Other Anaphylaxis    ILOSONE,  Caused GI distress.    Patient Measurements: Height: '5\' 6"'  (167.6 cm) Weight: (!) 322 lb (146.1 kg) IBW/kg (Calculated) : 59.3  Vital Signs: Temp: 98.3 F (36.8 C) (07/23 0510) Temp Source: Oral (07/23 0510) BP: 120/52 (07/23 0510) Pulse Rate: 59 (07/23 0510)  Labs:  Recent Labs  02/26/17 0827 02/27/17 0449 02/28/17 0339  HGB 8.6* 8.5* 8.1*  HCT 28.2* 27.8* 26.6*  PLT 232 254 259  LABPROT 17.7* 14.5 14.6  INR 1.44 1.12 1.13  CREATININE 1.57* 1.60* 1.56*    Estimated Creatinine Clearance: 56.2 mL/min (A) (by C-G formula based on SCr of 1.56 mg/dL (H)).   Medications:  Scheduled:  . amiodarone  400 mg Oral Daily  . chlorhexidine gluconate (MEDLINE KIT)  15 mL Mouth Rinse BID  . citalopram  20 mg Oral Daily  . insulin aspart  0-15 Units Subcutaneous Q4H  . insulin glargine  20 Units Subcutaneous QHS  . loratadine  10 mg Oral Daily  . mouth rinse  15 mL Mouth Rinse BID  . metoprolol tartrate  25 mg Oral Q6H  . simethicone  80 mg Oral QID  . Warfarin - Pharmacist Dosing Inpatient   Does not apply q1800   Infusions:  . norepinephrine (LEVOPHED) Adult infusion Stopped (02/24/17 0904)   PRN: acetaminophen **OR** [DISCONTINUED] acetaminophen, [DISCONTINUED] ondansetron **OR** ondansetron (ZOFRAN) IV, oxyCODONE, simethicone, sodium chloride flush  Assessment: 61 y/o female admitted with acute respiratory failure, pt INR supratherapeutic prior to admission, home dose 7.90m daily, but on fluconazole per PTA pharmacy records. Patient is s/p vitamin k 168msubq 7/19. Patient is on amiodarone this admission.   Goal of Therapy:  INR 2-3 Monitor platelets by anticoagulation protocol: Yes   Plan:  INR remaining stable and subtherapeutic, will increase. Warfarin 7.70m86m 1 dose tonight.  Will monitor INR and CBC moving forward AM labs.   Nyheem Binette 02/28/2017,8:48 AM

## 2017-02-28 NOTE — Progress Notes (Signed)
[facility]  ROUNDING NOTE   Subjective:   Husband at bedside.   Creatinine 1.56 (1.6)  Objective:  Vital signs in last 24 hours:  Temp:  [98.3 F (36.8 C)-98.6 F (37 C)] 98.5 F (36.9 C) (07/23 0800) Pulse Rate:  [59-60] 59 (07/23 0800) Resp:  [18] 18 (07/23 0800) BP: (120-135)/(52-57) 124/56 (07/23 0800) SpO2:  [95 %-99 %] 95 % (07/23 0800)  Weight change:  Filed Weights   02/22/17 0813  Weight: (!) 146.1 kg (322 lb)    Intake/Output: I/O last 3 completed shifts: In: 360 [P.O.:360] Out: 901 [Urine:900; Stool:1]   Intake/Output this shift:  Total I/O In: 240 [P.O.:240] Out: 700 [Urine:700]  Physical Exam: General: Sitting in chair  Head: [place]/AT hearing intact OM moist  Eyes: Anicteric  Neck: Supple, trachea midline  Lungs:  Bilateral rhonchi  Heart: S1S2 no rubs  Abdomen:  Soft, nontender, bowel sounds present  Extremities: no peripheral edema.  Neurologic: Awake, alert, follows commands  Skin: No lesions       Basic Metabolic Panel:  Recent Labs Lab 02/22/17 1706  02/23/17 1730 02/24/17 0403 02/25/17 0501 02/26/17 0827 02/27/17 0449 02/28/17 0339  NA 137  < > 138 141 141 141 141  --   K 5.9*  < > 4.8 4.7 4.1 4.6 4.7 4.1  CL 105  < > 102 102 100* 100* 99*  --   CO2 23  < > _0 32 32  --   GLUCOSE 422*  < > 116* 115* 175* 185* 137*  --   BUN 49*  < > 59* 66* 53* 41* 37*  --   CREATININE 2.32*  < > 2.75* 2.77* 1.96* 1.57* 1.60* 1.56*  CALCIUM 7.7*  < > 7.6* 7.7* 8.6* 9.1 9.3  --   MG 2.2  --   --   --   --  2.2  --   --   < > = values in this interval not displayed.  Liver Function Tests:  Recent Labs Lab 02/22/17 0755 02/24/17 0403 02/25/17 0501  AST 38 29 23  ALT _1 ALKPHOS 87 58 70  BILITOT 0.5 0.8 1.2  PROT 7.8 6.4* 7.2  ALBUMIN 3.5 3.0* 3.0*   No results for input(s): LIPASE, AMYLASE in the last 168 hours. No results for input(s): AMMONIA in the last 168 hours.  CBC:  Recent Labs Lab  02/22/17 0755  02/24/17 0403 02/24/17 1702 02/25/17 0501 02/26/17 0827 02/27/17 0449 02/28/17 0339  WBC 16.1*  < > 17.5*  --  17.3* 14.2* 15.3* 14.1*  NEUTROABS 11.6*  --   --   --   --  11.9*  --   --   HGB 8.6*  < > 6.5* 7.8* 8.3* 8.6* 8.5* 8.1*  HCT 30.2*  < > 22.4* 25.7* 26.9* 28.2* 27.8* 26.6*  MCV 72.9*  < > 67.9*  --  67.6* 70.1* 68.6* 69.6*  PLT 373  < > 228  --  233 232 254 259  < > = values in this interval not displayed.  Cardiac Enzymes:  Recent Labs Lab 02/22/17 0755 02/22/17 1040 02/22/17 1706 02/22/17 2243  TROPONINI <0.03 0.04* 0.08* 0.09*    BNP: Invalid input(s): POCBNP  CBG:  Recent Labs Lab 02/27/17 2029 02/27/17 2345 02/28/17 0414 02/28/17 0724 02/28/17 1136  GLUCAP 222* 166* 136* 131* 156*    Microbiology: Results for orders placed or performed during the hospital encounter of 02/22/17  Blood Culture (routine x  2)     Status: None   Collection Time: 02/22/17  7:55 AM  Result Value Ref Range Status   Specimen Description BLOOD RIGHT AV  Final   Special Requests   Final    BOTTLES DRAWN AEROBIC AND ANAEROBIC Blood Culture results may not be optimal due to an excessive volume of blood received in culture bottles   Culture NO GROWTH 5 DAYS  Final   Report Status 02/27/2017 FINAL  Final  Blood Culture (routine x 2)     Status: None   Collection Time: 02/22/17  8:15 AM  Result Value Ref Range Status   Specimen Description BLOOD RIGHT HAND  Final   Special Requests   Final    BOTTLES DRAWN AEROBIC AND ANAEROBIC Blood Culture results may not be optimal due to an excessive volume of blood received in culture bottles   Culture NO GROWTH 5 DAYS  Final   Report Status 02/27/2017 FINAL  Final  Culture, respiratory (NON-Expectorated)     Status: None   Collection Time: 02/22/17 10:00 AM  Result Value Ref Range Status   Specimen Description TRACHEAL ASPIRATE  Final   Special Requests Normal  Final   Gram Stain   Final    FEW WBC PRESENT,BOTH  PMN AND MONONUCLEAR NO ORGANISMS SEEN    Culture   Final    Consistent with normal respiratory flora. Performed at [facility], [place] [address] [place], [place] [address]    Report Status 02/25/2017 FINAL  Final  MRSA PCR Screening     Status: None   Collection Time: 02/22/17 10:50 AM  Result Value Ref Range Status   MRSA by PCR NEGATIVE NEGATIVE Final    Comment:        The GeneXpert MRSA Assay (FDA approved for NASAL specimens only), is one component of a comprehensive MRSA colonization surveillance program. It is not intended to diagnose MRSA infection nor to guide or monitor treatment for MRSA infections.   Urine culture     Status: None   Collection Time: 02/22/17 11:26 AM  Result Value Ref Range Status   Specimen Description URINE, CATHETERIZED  Final   Special Requests NONE  Final   Culture   Final    NO GROWTH Performed at [facility], [address], [place], [place] [address]    Report Status 02/24/2017 FINAL  Final    Coagulation Studies:  Recent Labs  02/26/17 0827 02/27/17 0449 02/28/17 0339  LABPROT 17.7* 14.5 14.6  INR 1.44 1.12 1.13    Urinalysis: No results for input(s): COLORURINE, LABSPEC, PHURINE, GLUCOSEU, HGBUR, BILIRUBINUR, KETONESUR, PROTEINUR, UROBILINOGEN, NITRITE, LEUKOCYTESUR in the last 72 hours.  Invalid input(s): APPERANCEUR    Imaging: Dg Chest Port 1 View  Result Date: 02/27/2017 CLINICAL DATA:  CHF, COPD, hypertension, former smoker, GERD EXAM: PORTABLE CHEST 1 VIEW COMPARISON:  Portable exam 0747 hours compared to 02/25/2017 FINDINGS: Enlargement of cardiac silhouette with mild pulmonary vascular congestion. Enlarged hila bilaterally. Infiltrates in the perihilar basilar regions bilaterally question improved pulmonary edema. Residual bibasilar atelectasis and question small RIGHT pleural effusion. No pneumothorax or acute osseous findings. IMPRESSION: Improved pulmonary edema with persistent bibasilar atelectasis  with small RIGHT pleural effusion. Prominent hila bilaterally, recommend attention on follow-up exams. Electronically Signed   By: Lavonia Dana M.D.   On: 02/27/2017 08:27     Medications:    . amiodarone  400 mg Oral Daily  . chlorhexidine gluconate (MEDLINE KIT)  15 mL Mouth Rinse  BID  . citalopram  20 mg Oral Daily  . furosemide  40 mg Intravenous Daily  . insulin aspart  0-15 Units Subcutaneous Q4H  . insulin glargine  20 Units Subcutaneous QHS  . loratadine  10 mg Oral Daily  . mouth rinse  15 mL Mouth Rinse BID  . metoprolol tartrate  25 mg Oral Q6H  . simethicone  80 mg Oral QID  . warfarin  7.5 mg Oral ONCE-1800  . Warfarin - Pharmacist Dosing Inpatient   Does not apply q1800   acetaminophen **OR** [DISCONTINUED] acetaminophen, [DISCONTINUED] ondansetron **OR** ondansetron (ZOFRAN) IV, oxyCODONE, simethicone, sodium chloride flush  Assessment/ Plan:  61 y.o. white female with hypothyroidism, atrial fibrillation, diabetes mellitus type 2, hypertension, obesity, GERD, osteoporosis, asthma, allergic rhinitis, chronic back pain, hyperlipidemia, B12 deficiency, and obstructive sleep apnea who was admitted with PEA arrest.    1. Acute renal failure with hyperkalemia on Chronic kidney disease stage IV with proteinuria secondary diabetes mellitus type 2 and NSAID abuse Baseline creatinine 1.89 GFR of 29 02/11/17 - Creatinine back to baseline.   2. Hypertension: blood pressure at goal.  - furosemide, metoprolol  3. Diabetes mellitus type II with chronic kidney disease: on metformin. Insulin dependent. Hemoglobin A1c 6.7% 11/12/16   LOS: 6 Alison Breeding 7/23/20181:31 PM

## 2017-02-28 NOTE — Progress Notes (Signed)
  Speech Language Pathology Treatment: Dysphagia  Patient Details Name: Andrea Rivers MRN: [id number] DOB: 10/21/1955 Today's Date: 02/28/2017 Time: 0109-3235 SLP Time Calculation (min) (ACUTE ONLY): 21 min  Assessment / Plan / Recommendation Clinical Impression  Pt more alert today and talkative. Pt has been consuming a Dysphagia 3 diet w/thin liquids since evaluation on 7/21.Nsg, pt and family report that pt has been tolerating her diet well. Pt demonstrated no overt s/s of aspiration with thin liquids at bedside. Vocal quality remained clear throughout. Pt refused trials of solids today, but reports that she typically follows solids with sips of liquids d/t dry mouth and missing back teeth. Recommend continue w/dysphagia III diet w/thin liquids. Will f/u re: toleration of diet in 1-2 days.    HPI HPI:  Andrea Rivers  is a 61 y.o. female with a known history of COPD, sleep apnea, diabetes type 2, morbid obesity, hypothyroidism and hypertension presents with acute respiratory failure. EMS was called for shortness of breath. When EMS got there patient was in respiratory distress. She was initially placed on CPAP. And then on the way to the hospital ambulance sheet did not have a pulse CPR was initiated and she did receive 4 doses of epinephrine. She did regain spontaneous circulation. Her airway was secured with LMA. She subsequently was intubated. Currently on the ventilator unable to provide any review of systems. Pt was extubated on 7/19. ST evaluted on 7/21 and pt has been consuming a Dys 3 diet w/thin liquids.      SLP Plan  Continue with current plan of care       Recommendations  Diet recommendations: Dysphagia 3 (mechanical soft);Thin liquid Liquids provided via: Cup Medication Administration: Whole meds with puree Supervision: Patient able to self feed Compensations: Minimize environmental distractions;Slow rate;Small sips/bites;Follow solids with liquid Postural Changes and/or  Swallow Maneuvers: Seated upright 90 degrees;Upright 30-60 min after meal                Oral Care Recommendations: Oral care BID;Patient independent with oral care Follow up Recommendations: Other (comment) (TBD) SLP Visit Diagnosis: Dysphagia, oral phase (R13.11) Plan: Continue with current plan of care       GO                [place] 02/28/2017, 10:15 AM

## 2017-02-28 NOTE — Clinical Social Work Note (Signed)
Clinical Social Work Assessment  Patient Details  Name: Andrea Rivers MRN: [id number] Date of Birth: 07/25/1956  Date of referral:  02/28/17               Reason for consult:  Facility Placement                Permission sought to share information with:  Family Supports, Customer service manager Permission granted to share information::  Yes, Verbal Permission Granted  Name::     Andrea, Rivers [phone]   Agency::  SNF admissions  Relationship::     Contact Information:     Housing/Transportation Living arrangements for the past 2 months:  Single Family Home Source of Information:  Patient Patient Interpreter Needed:  None Criminal Activity/Legal Involvement Pertinent to Current Situation/Hospitalization:  No - Comment as needed Significant Relationships:  Adult Children, Spouse Lives with:  Spouse Do you feel safe going back to the place where you live?  No Need for family participation in patient care:  No (Coment)  Care giving concerns:  Patient and family feel she needs some short term rehab before she is able to return back home.   Social Worker assessment / plan:  Patient is a married 61 year old female who is alert and oriented x4 and able to express her feelings.  Patient states she has not been to rehab before and but is familiar with the process of how CSW looks for SNF placement due to her husband being in rehab recently.  Patient was explained how insurance will pay for the stay at SNF.  Patient expressed that she would prefer [facility] or [facility].  CSW informed patient and his wfe that CSW will inform them once it is closer to time for discharge.  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  Patient gave CSW permission to begin bed search in [place].                                                                         Employment status:  Disabled (Comment on whether or not currently  receiving Disability) Insurance information:  Programmer, applications, Medicaid In [place] PT Recommendations:  [place] / Referral to community resources:  [place]  Patient/Family's Response to care:  Patient and family are agreeable to going to SNF for short term rehab.  Patient/Family's Understanding of and Emotional Response to Diagnosis, Current Treatment, and Prognosis:  Patient is motivated to work harder so she can regun   Emotional Assessment Appearance:  Appears stated age Attitude/Demeanor/Rapport:    Affect (typically observed):  Appropriate, Calm, Pleasant Orientation:  Oriented to Self, Oriented to Place, Oriented to  Time, Oriented to Situation Alcohol / Substance use:  Not Applicable Psych involvement (Current and /or in the community):  No (Comment)  Discharge Needs  Concerns to be addressed:  Lack of Support Readmission within the last 30 days:  No Current discharge risk:  Lack of support system Barriers to Discharge:  Continued Medical Work upvv   Anell Barr 02/28/2017, 5:25 PM

## 2017-02-28 NOTE — NC FL2 (Signed)
[place] LEVEL OF CARE SCREENING TOOL     IDENTIFICATION  Patient Name: Andrea Rivers Birthdate: May 19, 1956 Sex: female Admission Date (Current Location): 02/22/2017  [place] and [place] Number:  Selena Lesser [phone] [place] and Address:  [facility], [address] [place], [place] [address]      Provider Number: [id number]  Attending Physician Name and Address:  Theodoro Grist, MD  Relative Name and Phone Number:  Adilyn, Humes [phone]     Current Level of Care: Hospital Recommended Level of Care: [place] Prior Approval Number:    Date Approved/Denied:   PASRR Number: [id number]  Discharge Plan: SNF    Current Diagnoses: Patient Active Problem List   Diagnosis Date Noted  . Shock ([place])   . Acute respiratory failure ([place]) 01/26/2017    Orientation RESPIRATION BLADDER Height & Weight     Self, Time, Situation, Place  O2 (4L) Incontinent Weight: (!) 322 lb (146.1 kg) Height:  '5\' 6"'  (167.6 cm)  BEHAVIORAL SYMPTOMS/MOOD NEUROLOGICAL BOWEL NUTRITION STATUS      Continent Diet (Mechanical Soft)  AMBULATORY STATUS COMMUNICATION OF NEEDS Skin   Limited Assist Verbally Normal                       Personal Care Assistance Level of Assistance  Bathing, Feeding, Dressing Bathing Assistance: Limited assistance Feeding assistance: Independent Dressing Assistance: Limited assistance     Functional Limitations Info  Sight, Hearing, Speech Sight Info: Adequate Hearing Info: Adequate Speech Info: Adequate    SPECIAL CARE FACTORS FREQUENCY  PT (By licensed PT), OT (By licensed OT)     PT Frequency: 5x a week OT Frequency: 5x a week            Contractures Contractures Info: Not present    Additional Factors Info  Code Status, Psychotropic, Insulin Sliding Scale, Allergies Code Status Info: Full Code Allergies Info: Ilosone Psychotropic Info: citalopram (CELEXA) tablet 20  mg Insulin Sliding Scale Info: insulin aspart (novoLOG) injection 0-15 Units every 4 hours       Current Medications (02/28/2017):  This is the current hospital active medication list Current Facility-Administered Medications  Medication Dose Route Frequency Provider Last Rate Last Dose  . acetaminophen (TYLENOL) tablet 650 mg  650 mg Oral Q6H PRN [name], MD   650 mg at 02/25/17 1802  . amiodarone (PACERONE) tablet 400 mg  400 mg Oral Daily Paraschos, Alexander, MD   400 mg at 02/28/17 1100  . chlorhexidine gluconate (MEDLINE KIT) (PERIDEX) 0.12 % solution 15 mL  15 mL Mouth Rinse BID Varughese, Bincy S, NP   15 mL at 02/27/17 0922  . citalopram (CELEXA) tablet 20 mg  20 mg Oral Daily Theodoro Grist, MD   20 mg at 02/28/17 1100  . furosemide (LASIX) injection 40 mg  40 mg Intravenous Daily Theodoro Grist, MD   40 mg at 02/28/17 1125  . insulin aspart (novoLOG) injection 0-15 Units  0-15 Units Subcutaneous Q4H Wilhelmina Mcardle, MD   2 Units at 02/28/17 1226  . insulin glargine (LANTUS) injection 20 Units  20 Units Subcutaneous QHS Wilhelmina Mcardle, MD   20 Units at 02/27/17 2109  . loratadine (CLARITIN) tablet 10 mg  10 mg Oral Daily Theodoro Grist, MD   10 mg at 02/28/17 1100  . MEDLINE mouth rinse  15 mL Mouth Rinse BID Dustin Flock, MD   15 mL at 02/28/17 0820  . metoprolol tartrate (LOPRESSOR) tablet  25 mg  25 mg Oral Q6H Paraschos, Alexander, MD   25 mg at 02/28/17 1226  . ondansetron (ZOFRAN) injection 4 mg  4 mg Intravenous Q6H PRN Dustin Flock, MD      . oxyCODONE (Oxy IR/ROXICODONE) immediate release tablet 5 mg  5 mg Oral Q4H PRN Varughese, Bincy S, NP   5 mg at 02/28/17 1546  . simethicone (MYLICON) chewable tablet 80 mg  80 mg Oral Q6H PRN Fritzi Mandes, MD   80 mg at 02/27/17 1610  . simethicone (MYLICON) chewable tablet 80 mg  80 mg Oral QID Theodoro Grist, MD   80 mg at 02/28/17 1546  . sodium chloride flush (NS) 0.9 % injection 10-40 mL  10-40 mL Intracatheter PRN Dustin Flock, MD      . warfarin (COUMADIN) tablet 7.5 mg  7.5 mg Oral ONCE-1800 Coffee, Donna Christen, [facility]      . Warfarin - Pharmacist Dosing Inpatient   Does not apply q1800 [facility]         Discharge Medications: Please see discharge summary for a list of discharge medications.  Relevant Imaging Results:  Relevant Lab Results:   Additional Information SSN [id number]  Ross Ludwig, [place]

## 2017-02-28 NOTE — Progress Notes (Signed)
Up to chair by phys therapy. Pt sat up for approx 4 hrs and tol well. Bathed partially herself., and completed by staff. Back to bed with 2 assists plus walker. Shampoo given and med for pain. External catheter re applied.

## 2017-02-28 NOTE — Discharge Instructions (Signed)
Heart Failure Clinic appointment on March 10 2017 at 8:20am with Darylene Price, [place]. Please call [phone] to reschedule.

## 2017-02-28 NOTE — Evaluation (Signed)
Physical Therapy Evaluation Patient Details Name: Andrea Rivers MRN: [id number] DOB: [birth year] Today's Date: 02/28/2017   History of Present Illness  Pt is a 61 yo F admitted to acute care following MI on 7/17. Of note, pt was resuscitated x2 on day of arrival, inubated, and then extubated on 7/19. Prior to admission, pt independent with ADL's/IADL's, using no AD and driving. However, not working and disabled due to reported lumbar fx in 1999. PMH: COPD, sleep apnea, type 2 diabetes, morbid obesity, hypothyroidism, HTN, neuropathy, anxiety, orthopenia, and vertigo.   Clinical Impression  Pt is pleasant, but presents with slight anxiety and is chatty. Pt performs bed mobility with maxA +2, tranfers with ModA +2, and ambulation with minA +2 due to impaired strength, power, endurance, balance, and pain. Pt amb max of 3 ft with B UE support from RW and 2 person assist. Pt on 4L supplemental O2, with sats dropping to 83% in supine position, but recovered quickly to >90% with elevation of HOB and education on pursed lipped breathing. Pt demo good carryover with education on pursed lipped breathing, though requires cont cuing to breath with functional mobility, as she has tendency to hold breath. Pt would benefit from skilled PT to address the previously mentioned impairments and promote return to PLOF. Currently recommending SNF pending d/c, due to severely impaired muscular and cardiopulmonary endurance.      Follow Up Recommendations SNF    Equipment Recommendations  Rolling walker with 5" wheels    Recommendations for Other Services       Precautions / Restrictions Precautions Precautions: Fall Restrictions Weight Bearing Restrictions: No      Mobility  Bed Mobility Overal bed mobility: Needs Assistance Bed Mobility: Supine to Sit     Supine to sit: Max assist;+2 for physical assistance     General bed mobility comments: Pt Max +2 with bed mobility for physical asssit. required  mod verbal and visual cues for mechanics and safety. HOB elevated and R bed rail utilized to pull to sit. Pt with maxA to scoot to EOB. Min reports of dizziness upon sitting.  Transfers Overall transfer level: Needs assistance Equipment used: Rolling walker (2 wheeled) Transfers: Sit to/from Stand Sit to Stand: Mod assist;+2 physical assistance         General transfer comment: Min cues on corerct mechancis with ft and UE placement. Mod +2 physical asssit to achieve erect standing posture. no reports of dizziness upon standing.   Ambulation/Gait Ambulation/Gait assistance: Min assist;+2 physical assistance Ambulation Distance (Feet): 3 Feet Assistive device: Rolling walker (2 wheeled) Gait Pattern/deviations: Shuffle     General Gait Details: MinA +2 for transferring from bed to chair. Pt with shuffling gait pattern and requires increased time and mod verbal cues on correct mechancis with RW adn generalized safety awareness. Pt on 4L supplemental O2 throughout amb.   Stairs            Wheelchair Mobility    Modified Rankin (Stroke Patients Only)       Balance Overall balance assessment: Needs assistance Sitting-balance support: Bilateral upper extremity supported;Feet supported Sitting balance-Leahy Scale: Fair Sitting balance - Comments:  Pt CGA with sitting balance, due to mild reports of dizziness; bolster placed beneath B ft for LE support at EOB   Standing balance support: Bilateral upper extremity supported Standing balance-Leahy Scale: Poor Standing balance comment: Pt with poor standing balance with B UE support, requiring minA +2 to maintain. No reports of dizziness noted.  Pertinent Vitals/Pain Pain Assessment: 0-10 Pain Score: 8  Pain Location: "chest." with further questioning, pt revealed that it is rib pain from fx, due to resuscitation. Pt advised to notify SPT if pain changes. Pain Descriptors / Indicators:  Discomfort;Constant Pain Intervention(s): Limited activity within patient's tolerance;Monitored during session;Repositioned;Relaxation    Home Living Family/patient expects to be discharged to:: Private residence Living Arrangements: (P) Spouse/significant other (Spouse disabled, using WC for functional mobility.) Available Help at Discharge: Family Type of Home: House Home Access: Ramped entrance     Home Layout: Multi-level;Able to live on main level with bedroom/bathroom Home Equipment: None      Prior Function Level of Independence: Independent               Hand Dominance        Extremity/Trunk Assessment   Upper Extremity Assessment Upper Extremity Assessment: Generalized weakness (R LE grossly 2+/5. L UE grossly 4-/5)    Lower Extremity Assessment Lower Extremity Assessment: Generalized weakness (MMT: grossly 3+/5 to B LE's )       Communication   Communication: No difficulties  Cognition Arousal/Alertness: Awake/alert Behavior During Therapy: Anxious (Chatty ) Overall Cognitive Status: Within Functional Limits for tasks assessed                                        General Comments      Exercises Other Exercises Other Exercises: Supine therex to B LE's with supervision x10: ankle pumps, hip abd, heel slides, quad sets, and glute squeezes. Seated therex performed to B LE's with B LE support and CGA x 10 reps: marches, SAQ's and L UE bicep curls with mini water bottle. Pt on 4L supplemental O2 throughout therex, maintaining O2 sat >90% Other Exercises: Rolling bed mobility for changing diaper and hygiene purposes. Pt required maxA +2 for rolling L and R and scooting up in bed with HOB flat. With bed flat, pt experienced increeased coughing and O2 sat dropped to 83% on 2 L supplemental O2. Pt educated on pursed lipped breathing adn HOB intermittently elevated as a rest break for pt. Pt required increased time and max verbal/tactile cues to  perform task. Pt required ~ 8 rest breaks throughout task, due to impaired endurance and drop in O2 sat. Pt able to increased O2 sat to >90% with HOB elevated and pursed lipped breathing.,   Assessment/Plan    PT Assessment Patient needs continued PT services  PT Problem List Decreased strength;Decreased activity tolerance;Decreased balance;Decreased mobility;Decreased coordination;Decreased safety awareness;Obesity;Pain       PT Treatment Interventions DME instruction;Gait training;Functional mobility training;Therapeutic exercise;Therapeutic activities;Balance training;Neuromuscular re-education;Patient/family education    PT Goals (Current goals can be found in the Care Plan section)  Acute Rehab PT Goals Patient Stated Goal: to go home  PT Goal Formulation: With patient Time For Goal Achievement: 03/14/17 Potential to Achieve Goals: Good    Frequency Min 2X/week   Barriers to discharge        Co-evaluation               AM-PAC PT "6 Clicks" Daily Activity  Outcome Measure Difficulty turning over in bed (including adjusting bedclothes, sheets and blankets)?: Total Difficulty moving from lying on back to sitting on the side of the bed? : Total Difficulty sitting down on and standing up from a chair with arms (e.g., wheelchair, bedside commode, etc,.)?: Total Help needed moving to  and from a bed to chair (including a wheelchair)?: A Lot Help needed walking in hospital room?: A Lot Help needed climbing 3-5 steps with a railing? : Total 6 Click Score: 8    End of Session Equipment Utilized During Treatment: Gait belt;Oxygen (4 L O2) Activity Tolerance: Other (comment);Patient limited by pain (limited by drop in O2 and endurance.) Patient left: in chair;with call bell/phone within reach;with chair alarm set Nurse Communication: Mobility status PT Visit Diagnosis: Unsteadiness on feet (R26.81);Other abnormalities of gait and mobility (R26.89);Muscle weakness (generalized)  (M62.81);Pain Pain - Right/Left: Right Pain - part of body: Shoulder (B ribs)    Time: 0174-9449 PT Time Calculation (min) (ACUTE ONLY): 67 min   Charges:         PT G Codes:        Oran Rein PT, SPT   Bevelyn Ngo 02/28/2017, 12:55 PM

## 2017-03-01 LAB — BASIC METABOLIC PANEL
ANION GAP: 11 (ref 5–15)
BUN: 39 mg/dL — ABNORMAL HIGH (ref 6–20)
CHLORIDE: 97 mmol/L — AB (ref 101–111)
CO2: 29 mmol/L (ref 22–32)
CREATININE: 1.53 mg/dL — AB (ref 0.44–1.00)
Calcium: 8.9 mg/dL (ref 8.9–10.3)
GFR calc non Af Amer: 36 mL/min — ABNORMAL LOW (ref >60)
GFR, EST AFRICAN AMERICAN: 41 mL/min — AB (ref >60)
Glucose, Bld: 169 mg/dL — ABNORMAL HIGH (ref 65–99)
Potassium: 4.2 mmol/L (ref 3.5–5.1)
SODIUM: 137 mmol/L (ref 135–145)

## 2017-03-01 LAB — GLUCOSE, CAPILLARY
GLUCOSE-CAPILLARY: 155 mg/dL — AB (ref 65–99)
GLUCOSE-CAPILLARY: 316 mg/dL — AB (ref 65–99)
Glucose-Capillary: 243 mg/dL — ABNORMAL HIGH (ref 65–99)
Glucose-Capillary: 219 mg/dL — ABNORMAL HIGH (ref 65–99)

## 2017-03-01 LAB — PROTIME-INR
INR: 1.16
Prothrombin Time: 14.9 seconds (ref 11.4–15.2)

## 2017-03-01 MED ORDER — WARFARIN SODIUM 10 MG PO TABS
10.0000 mg | ORAL_TABLET | Freq: Once | ORAL | Status: AC
  Administered 2017-03-01: 10 mg via ORAL
  Filled 2017-03-01: qty 1

## 2017-03-01 MED ORDER — METOPROLOL TARTRATE 25 MG PO TABS
25.0000 mg | ORAL_TABLET | Freq: Two times a day (BID) | ORAL | Status: DC
  Administered 2017-03-01 – 2017-03-03 (×4): 25 mg via ORAL
  Filled 2017-03-01 (×4): qty 1

## 2017-03-01 MED ORDER — INSULIN GLARGINE 100 UNIT/ML ~~LOC~~ SOLN
25.0000 [IU] | Freq: Every day | SUBCUTANEOUS | Status: DC
  Administered 2017-03-01 – 2017-03-02 (×2): 25 [IU] via SUBCUTANEOUS
  Filled 2017-03-01 (×3): qty 0.25

## 2017-03-01 MED ORDER — LIDOCAINE 5 % EX PTCH
1.0000 | MEDICATED_PATCH | CUTANEOUS | Status: DC
  Administered 2017-03-01 – 2017-03-03 (×3): 1 via TRANSDERMAL
  Filled 2017-03-01 (×3): qty 1

## 2017-03-01 NOTE — Care Management (Signed)
Barrier- Continued IV diuresis.   Continued need for 4 liters oxygen- acute. PT has recommended SNF and patient in agreement

## 2017-03-01 NOTE — Clinical Social Work Note (Signed)
CSW presented bed offers to patient and her family, they have chosen [facility].  CSW contacted [facility] and they can accept patient once she is medically ready for discharge and orders have been received.  Jones Broom. [place], MSW, [place]  03/01/2017 11:48 AM

## 2017-03-01 NOTE — Progress Notes (Signed)
Pt had good day. Up in chair for entire afternoon. Having some sternal pain. placedlidocaine patch with relief. [facility] out put good. Family in to visit.

## 2017-03-01 NOTE — Evaluation (Signed)
Occupational Therapy Evaluation Patient Details Name: Andrea Rivers MRN: [id number] DOB: [birth year] Today's Date: 03/01/2017    History of Present Illness Pt. is a 61 y.o. female who was admitted to [facility] with Respiratory Distress on 7/17. Pt. was resuscitaed 2 x's upon admission. Pt. was intubated, and extubated on 7/19. PMHx includes: COPD, Lumbar Fractures in 1999, sleep apnea, Type II  DM, morbid obesity, hypothyroidism, HTN, Neuropathy, Anxiety, Orthopenia, and vertigo.   Clinical Impression   Pt. Is on 4LO2. SO2 is 93%, HR is 73. Pt. Presents with weakness, decreased activity tolerance, pain, and limited functional mobility. Pt. Has 7/10 pain in the chest from the chest compressions, pain is aggravated with UE movement. Pt. education was provided about A/E use for LE ADLS, energy conservation, and work simplification techniques. Pt. Was verbally educated, visual demonstration was provided, and a visual handout was provided. Pt. Would benefit from SNF placement upon discharge with follow-up OT services. Pt. resides home alone, however has family support available.     Follow Up Recommendations  SNF    Equipment Recommendations       Recommendations for Other Services       Precautions / Restrictions Precautions Precautions: Fall                                                    ADL either performed or assessed with clinical judgement   ADL Overall ADL's : Needs assistance/impaired Eating/Feeding: Set up;Bed level;Minimal assistance   Grooming: Minimal assistance;Set up;Bed level   Upper Body Bathing: Maximal assistance   Lower Body Bathing: Total assistance   Upper Body Dressing : Maximal assistance   Lower Body Dressing: Total assistance               Functional mobility during ADLs: Maximal assistance;+2 for physical assistance General ADL Comments: Pt. education was provided about A/E use for LE ADLs, and energy conservation/work  simplification techniques.     Vision Patient Visual Report: No change from baseline       Perception     Praxis      Pertinent Vitals/Pain Pain Assessment: 0-10 Pain Score: 7  (Pain increases with coughing.) Pain Location: chest, worsens with UE movement. Pain Intervention(s): Limited activity within patient's tolerance;Monitored during session     Hand Dominance Right   Extremity/Trunk Assessment Upper Extremity Assessment Upper Extremity Assessment: RUE deficits/detail;LUE deficits/detail RUE Deficits / Details: Pt. is unable to actively perform shoulder flexion, abduction , however is able to tolerate PROM. elbow flexion, extension, WFL, grip: weaker. RUE: Unable to fully assess due to pain LUE Deficits / Details: WFL, however pt. has chest discomfort with UE movement.           Communication Communication Communication: No difficulties   Cognition Arousal/Alertness: Awake/alert   Overall Cognitive Status: Within Functional Limits for tasks assessed                                     General Comments       Exercises     Shoulder Instructions      Home Living Family/patient expects to be discharged to:: Private residence Living Arrangements: Spouse/significant other Available Help at Discharge: Family Type of Home: House Home Access: Ramped entrance  Home Layout: Able to live on main level with bedroom/bathroom;One level (Resides in a double-wide.)                          Prior Functioning/Environment Level of Independence: Needs assistance    ADL's / Homemaking Assistance Needed: Pt. reports being independent with ADLs, and IADLs. Pt is on disability from lumbar fractures sustained in 1999.            OT Problem List:        OT Treatment/Interventions:      OT Goals(Current goals can be found in the care plan section) Acute Rehab OT Goals Patient Stated Goal: To return home OT Goal Formulation: With  patient Potential to Achieve Goals: Good  OT Frequency:     Barriers to D/C:            Co-evaluation              AM-PAC PT "6 Clicks" Daily Activity     Outcome Measure Help from another person eating meals?: A Little Help from another person taking care of personal grooming?: A Little Help from another person toileting, which includes using toliet, bedpan, or urinal?: A Lot Help from another person bathing (including washing, rinsing, drying)?: Total Help from another person to put on and taking off regular upper body clothing?: A Lot Help from another person to put on and taking off regular lower body clothing?: Total 6 Click Score: 12   End of Session    Activity Tolerance: Patient tolerated treatment well;Patient limited by pain Patient left: in bed;with call bell/phone within reach;with bed alarm set                   Time: 0600-4599 OT Time Calculation (min): 35 min Charges:  OT General Charges $OT Visit: 1 Procedure OT Evaluation $OT Eval Moderate Complexity: 1 Procedure G-Codes:    Harrel Carina, MS, OTR/L  Harrel Carina, MS, OTR/L 03/01/2017, 2:26 PM

## 2017-03-01 NOTE — Progress Notes (Signed)
[facility]                                                                                                                                                                                  Patient Demographics   Andrea Rivers, is a 61 y.o. female, DOB - 09-09-1955, RUO:[id number]  Admit date - 02/22/2017   Admitting Physician Dustin Flock, MD  Outpatient Primary MD for the patient is Gauger, Victoriano Lain, NP   LOS - 7  Subjective: The patient remains in sinus rhythm on high dose of amiodarone orally . Diuresed Over the past few days with 40 mg of Lasix Intravenously, remains on 4 L of oxygen with better oxygen, saturation .  Chest x-ray shows improvement in congestive heart failure Patient is  some bradycardic, but  feels overall better today ,  out of bed to chair  The patient fell down yesterday while being transferred from chair to bed, hurting her chest, complains of some chest pains and back pains at present, denies any significant shortness of breath. Review of Systems:     CONSTITUTIONAL: No documented fever. No fatigue, weakness. No weight gain, no weight loss.  EYES: No blurry or double vision.  ENT: No tinnitus. No postnasal drip. No redness of the oropharynx.  RESPIRATORY: No cough, no wheeze, no hemoptysis. No dyspnea.  CARDIOVASCULAR: No chest pain. No orthopnea. No palpitations. No syncope.  GASTROINTESTINAL: No nausea, no vomiting or diarrhea. No abdominal pain. No melena or hematochezia.  GENITOURINARY:  No urgency. No frequency. No dysuria. No hematuria. No obstructive symptoms. No discharge. No pain. No significant abnormal bleeding ENDOCRINE: No polyuria or nocturia. No heat or cold intolerance.  HEMATOLOGY: No anemia. No bruising. No bleeding. No purpura. No petechiae INTEGUMENTARY: No rashes. No lesions.  MUSCULOSKELETAL: Complains of pain in the right arm has a history of bursitis NEUROLOGIC: No numbness, tingling, or  ataxia. No seizure-type activity.  PSYCHIATRIC: No anxiety. No insomnia. No ADD.       Vitals:   Vitals:   02/28/17 0800 02/28/17 2005 03/01/17 0416 03/01/17 0800  BP: (!) 124/56 102/79 (!) 142/84 (!) 130/44  Pulse: (!) 59 (!) 52 (!) 50   Resp: _0 Temp: 98.5 F (36.9 C) 99.1 F (37.3 C) 97.7 F (36.5 C) 98 F (36.7 C)  TempSrc: Oral Oral Oral Oral  SpO2: 95% 92% 96% 98%  Weight:      Height:        Wt Readings from Last 3 Encounters:  02/22/17 (!) 146.1 kg (322 lb)  01/26/17 135.8 kg (299 lb 6.2 oz)     Intake/Output Summary (Last 24  hours) at 03/01/17 1228 Last data filed at 03/01/17 1045  Gross per 24 hour  Intake              960 ml  Output             1050 ml  Net              -90 ml    Physical Exam:   GENERAL: Critically ill-appearing, Obese HEAD, EYES, EARS, NOSE AND THROAT: Atraumatic, normocephalic.. Pupils equal and reactive to light. Sclerae anicteric. No conjunctival injection. No oro-pharyngeal erythema.  NECK: Supple. There is no jugular venous distention. No bruits, no lymphadenopathy, no thyromegaly.  HEART: Rhythm was regular  No murmurs, no rubs, no clicks.  LUNGS:  Diminished breath sounds bilaterally at bases, few scattered crackles   ABDOMEN: Soft, flat, nontender, nondistended. Has good bowel sounds. No hepatosplenomegaly appreciated.  EXTREMITIES: No evidence of any cyanosis, clubbing, Trace peripheral edema.  +2 pedal and radial pulses bilaterally.  NEUROLOGIC: Sedated SKIN: Moist and warm with no rashes appreciated.  Psych: Sedated  LN: No inguinal LN enlargement    Antibiotics   Anti-infectives    Start     Dose/Rate Route Frequency Ordered Stop   02/22/17 1500  ceFEPIme (MAXIPIME) 2 g in dextrose 5 % 50 mL IVPB  Status:  Discontinued     2 g 100 mL/hr over 30 Minutes Intravenous Every 12 hours 02/22/17 1127 02/24/17 1111   02/22/17 1400  vancomycin (VANCOCIN) 1,250 mg in sodium chloride 0.9 % 250 mL IVPB  Status:   Discontinued     1,250 mg 166.7 mL/hr over 90 Minutes Intravenous Every 18 hours 02/22/17 0842 02/23/17 0839   02/22/17 1200  azithromycin (ZITHROMAX) 500 mg in dextrose 5 % 250 mL IVPB  Status:  Discontinued     500 mg 250 mL/hr over 60 Minutes Intravenous Every 24 hours 02/22/17 1113 02/25/17 1054   02/22/17 0845  piperacillin-tazobactam (ZOSYN) IVPB 4.5 g  Status:  Discontinued     4.5 g 200 mL/hr over 30 Minutes Intravenous Every 8 hours 02/22/17 0842 02/22/17 1113   02/22/17 0830  vancomycin (VANCOCIN) IVPB 1000 mg/200 mL premix     1,000 mg 200 mL/hr over 60 Minutes Intravenous  Once 02/22/17 0823 02/22/17 1030   02/22/17 0815  vancomycin (VANCOCIN) injection 2,000 mg  Status:  Discontinued     2,000 mg Intravenous  Once 02/22/17 0804 02/22/17 0823   02/22/17 0815  piperacillin-tazobactam (ZOSYN) IVPB 3.375 g     3.375 g 100 mL/hr over 30 Minutes Intravenous  Once 02/22/17 0804 02/22/17 0925      Medications   Scheduled Meds: . amiodarone  400 mg Oral Daily  . chlorhexidine gluconate (MEDLINE KIT)  15 mL Mouth Rinse BID  . citalopram  20 mg Oral Daily  . furosemide  40 mg Intravenous Daily  . insulin aspart  0-5 Units Subcutaneous QHS  . insulin aspart  0-9 Units Subcutaneous TID WC  . insulin glargine  20 Units Subcutaneous QHS  . lidocaine  1 patch Transdermal Q24H  . loratadine  10 mg Oral Daily  . mouth rinse  15 mL Mouth Rinse BID  . metoprolol tartrate  25 mg Oral Q6H  . simethicone  80 mg Oral QID  . sodium chloride flush  3 mL Intravenous Q12H  . warfarin  10 mg Oral ONCE-1800  . Warfarin - Pharmacist Dosing Inpatient   Does not apply q1800   Continuous Infusions:  PRN Meds:.acetaminophen **  OR** [DISCONTINUED] acetaminophen, [DISCONTINUED] ondansetron **OR** ondansetron (ZOFRAN) IV, oxyCODONE, simethicone, sodium chloride flush   Data Review:   Micro Results Recent Results (from the past 240 hour(s))  Blood Culture (routine x 2)     Status: None    Collection Time: 02/22/17  7:55 AM  Result Value Ref Range Status   Specimen Description BLOOD RIGHT AV  Final   Special Requests   Final    BOTTLES DRAWN AEROBIC AND ANAEROBIC Blood Culture results may not be optimal due to an excessive volume of blood received in culture bottles   Culture NO GROWTH 5 DAYS  Final   Report Status 02/27/2017 FINAL  Final  Blood Culture (routine x 2)     Status: None   Collection Time: 02/22/17  8:15 AM  Result Value Ref Range Status   Specimen Description BLOOD RIGHT HAND  Final   Special Requests   Final    BOTTLES DRAWN AEROBIC AND ANAEROBIC Blood Culture results may not be optimal due to an excessive volume of blood received in culture bottles   Culture NO GROWTH 5 DAYS  Final   Report Status 02/27/2017 FINAL  Final  Culture, respiratory (NON-Expectorated)     Status: None   Collection Time: 02/22/17 10:00 AM  Result Value Ref Range Status   Specimen Description TRACHEAL ASPIRATE  Final   Special Requests Normal  Final   Gram Stain   Final    FEW WBC PRESENT,BOTH PMN AND MONONUCLEAR NO ORGANISMS SEEN    Culture   Final    Consistent with normal respiratory flora. Performed at [facility], [place] [address], [place], [place] [address]    Report Status 02/25/2017 FINAL  Final  MRSA PCR Screening     Status: None   Collection Time: 02/22/17 10:50 AM  Result Value Ref Range Status   MRSA by PCR NEGATIVE NEGATIVE Final    Comment:        The GeneXpert MRSA Assay (FDA approved for NASAL specimens only), is one component of a comprehensive MRSA colonization surveillance program. It is not intended to diagnose MRSA infection nor to guide or monitor treatment for MRSA infections.   Urine culture     Status: None   Collection Time: 02/22/17 11:26 AM  Result Value Ref Range Status   Specimen Description URINE, CATHETERIZED  Final   Special Requests NONE  Final   Culture   Final    NO GROWTH Performed at [facility], [address] [place], [place] [address]    Report Status 02/24/2017 FINAL  Final    Radiology Reports Dg Abd 1 View  Result Date: 02/24/2017 CLINICAL DATA:  NG tube placement EXAM: ABDOMEN - 1 VIEW COMPARISON:  02/23/2017 FINDINGS: The tip and side port of a gastric tube are noted in the right upper quadrant presumably in the distal stomach. The patient is slightly rotated on current exam. Cardiomegaly is noted with central line catheter tip projecting over the cavoatrial junction. IMPRESSION: 1. Gastric tube in the right upper quadrant suggesting a distal gastric position of the gastric tube tip. 2. Cardiomegaly with central line catheter in the expected location of the cavoatrial junction. Electronically Signed   By: Ashley Royalty M.D.   On: 02/24/2017 01:59   US Venous Img Lower Bilateral  Result Date: 02/23/2017 CLINICAL DATA:  Bilateral lower extremity edema. EXAM: BILATERAL LOWER EXTREMITY VENOUS DOPPLER ULTRASOUND TECHNIQUE: Gray-scale sonography with graded compression, as well as color Doppler and duplex  ultrasound were performed to evaluate the lower extremity deep venous systems from the level of the common femoral vein and including the common femoral, femoral, profunda femoral, popliteal and calf veins including the posterior tibial, peroneal and gastrocnemius veins when visible. The superficial great saphenous vein was also interrogated. Spectral Doppler was utilized to evaluate flow at rest and with distal augmentation maneuvers in the common femoral, femoral and popliteal veins. COMPARISON:  Ultrasound 06/13/2007. FINDINGS: RIGHT LOWER EXTREMITY Common Femoral Vein: No evidence of thrombus. Normal compressibility, respiratory phasicity and response to augmentation. Saphenofemoral Junction: No evidence of thrombus. Normal compressibility and flow on color Doppler imaging. Profunda Femoral Vein: No evidence of thrombus. Normal compressibility and flow on color Doppler imaging. Femoral Vein: No evidence  of thrombus. Normal compressibility, respiratory phasicity and response to augmentation. Popliteal Vein: No evidence of thrombus. Normal compressibility, respiratory phasicity and response to augmentation. Calf Veins: No evidence of thrombus. Normal compressibility and flow on color Doppler imaging. Limited visualization due to edema and patient clinical condition . Superficial Great Saphenous Vein: No evidence of thrombus. Normal compressibility and flow on color Doppler imaging. Other Findings:  None. LEFT LOWER EXTREMITY Common Femoral Vein: No evidence of thrombus. Normal compressibility, respiratory phasicity and response to augmentation. Saphenofemoral Junction: No evidence of thrombus. Normal compressibility and flow on color Doppler imaging. Profunda Femoral Vein: No evidence of thrombus. Normal compressibility and flow on color Doppler imaging. Femoral Vein: No evidence of thrombus. Normal compressibility, respiratory phasicity and response to augmentation. Popliteal Vein: No evidence of thrombus. Normal compressibility, respiratory phasicity and response to augmentation. Calf Veins: No evidence of thrombus. Normal compressibility and flow on color Doppler imaging. Limited visualization due to edema and patient clinical condition . Superficial Great Saphenous Vein: No evidence of thrombus. Normal compressibility and flow on color Doppler imaging. Other Findings:  None. IMPRESSION: No evidence of DVT within either lower extremity. Electronically Signed   By: Marcello Moores  Register   On: 02/23/2017 12:51   Dg Chest Port 1 View  Result Date: 02/27/2017 CLINICAL DATA:  CHF, COPD, hypertension, former smoker, GERD EXAM: PORTABLE CHEST 1 VIEW COMPARISON:  Portable exam 0747 hours compared to 02/25/2017 FINDINGS: Enlargement of cardiac silhouette with mild pulmonary vascular congestion. Enlarged hila bilaterally. Infiltrates in the perihilar basilar regions bilaterally question improved pulmonary edema. Residual  bibasilar atelectasis and question small RIGHT pleural effusion. No pneumothorax or acute osseous findings. IMPRESSION: Improved pulmonary edema with persistent bibasilar atelectasis with small RIGHT pleural effusion. Prominent hila bilaterally, recommend attention on follow-up exams. Electronically Signed   By: Lavonia Dana M.D.   On: 02/27/2017 08:27   Dg Chest Port 1 View  Result Date: 02/25/2017 CLINICAL DATA:  Respiratory failure. EXAM: PORTABLE CHEST 1 VIEW COMPARISON:  02/23/2017. FINDINGS: Interim extubation removal of NG tube. Right IJ line noted with tip projected over the right atrium. Cardiomegaly with pulmonary venous congestion and bilateral pulmonary infiltrates consistent pulmonary edema. Small bilateral pleural effusions. IMPRESSION: 1. Interim extubation and removal of NG tube. Right IJ line noted with tip projected over the right atrium. 2. Cardiomegaly with bilateral pulmonary venous congestion and pulmonary infiltrates consistent pulmonary edema. Small bilateral pleural effusions. No change from prior exam. Electronically Signed   By: Marcello Moores  Register   On: 02/25/2017 06:48   Dg Chest Port 1 View  Result Date: 02/24/2017 CLINICAL DATA:  Respiratory failure. EXAM: PORTABLE CHEST 1 VIEW COMPARISON:  02/23/2017. FINDINGS: Endotracheal tube, NG tube, right IJ line in stable position. Cardiomegaly with bilateral pulmonary infiltrates/edema and  small bilateral pleural effusions. Findings most consistent with congestive heart failure. Slight worsening from prior exam . IMPRESSION: 1. Lines and tubes in stable position. 2. Congestive heart failure pulmonary edema. Slight worsening of pulmonary edema from prior exams. Small bilateral pleural effusions again noted. Electronically Signed   By: Marcello Moores  Register   On: 02/24/2017 07:39   Dg Chest Port 1 View  Result Date: 02/23/2017 CLINICAL DATA:  Acute respiratory failure. EXAM: PORTABLE CHEST 1 VIEW COMPARISON:  02/23/2017 chest radiograph.  FINDINGS: Right rotated chest radiograph. Endotracheal tube tip is 2.1 cm above the carina. Right internal jugular central venous catheter terminates at the cavoatrial junction. Enteric tube enters the lower thoracic esophagus, with the tip not seen on this image. Stable cardiomediastinal silhouette with mild cardiomegaly. No pneumothorax. Small bilateral pleural effusions. Mild-to-moderate pulmonary edema. Bibasilar atelectasis. IMPRESSION: 1. Well-positioned endotracheal tube. Enteric tube enters the lower thoracic esophagus with the tip not seen on this image. 2. Mild-to-moderate congestive heart failure. 3. Small bilateral pleural effusions with bibasilar atelectasis. Electronically Signed   By: Ilona Sorrel M.D.   On: 02/23/2017 19:56   Dg Chest Port 1 View  Result Date: 02/23/2017 CLINICAL DATA:  Shortness of breath. EXAM: PORTABLE CHEST 1 VIEW COMPARISON:  02/22/2017. FINDINGS: Surgical clips right chest. Endotracheal tube and right IJ line stable position. NG tube tip remains projected over the lower esophagus, advancement of approximately 20 cm should be considered. Patchy infiltrates in the right lung have improved. Diffuse left lung infiltrate remains. Basilar atelectasis. No prominent pleural effusion. No pneumothorax. Stable cardiomegaly . IMPRESSION: 1. NG tube tip again noted over the lower esophagus. Advancement of approximately 20 cm should be considered. Endotracheal tube and right IJ line stable position. 2. Partial clearing of patchy infiltrates right lung. Diffuse left lung infiltrate remains. Low lung volumes. 3. Stable cardiomegaly. Critical Value/emergent results were called by telephone at the time of interpretation on 02/23/2017 at 6:44 am to nurse Pamala Hurry , who verbally acknowledged these results. Electronically Signed   By: Marcello Moores  Register   On: 02/23/2017 06:48   Dg Chest Port 1 View  Result Date: 02/22/2017 CLINICAL DATA:  61 year old female post CPR.  Subsequent encounter.  EXAM: PORTABLE CHEST 1 VIEW COMPARISON:  02/22/2017 8:52 a.m. FINDINGS: Endotracheal tube tip 3.6 cm above the carina. Endotracheal tube tip mid to distal esophagus level with side hole proximal to mid esophagus level. This needs to be reposition. Right internal jugular catheter tip mid superior vena cava level. Cardiomegaly. Patchy consolidation most notable right upper lobe, left midlung zone and right perihilar region. Question multifocal pneumonia versus asymmetric pulmonary edema. No gross pneumothorax.  Evaluation limited by overlying structures. No obvious rib fracture. IMPRESSION: Endotracheal tube tip 3.6 cm above the carina. Endotracheal tube tip mid to distal esophagus level with side hole proximal to mid esophagus level. This needs to be reposition. Right internal jugular catheter tip mid superior vena cava level. Cardiomegaly. Patchy consolidation most notable right upper lobe, left midlung zone and right perihilar region. Question multifocal pneumonia versus asymmetric pulmonary edema. No gross pneumothorax.  Evaluation limited by overlying structures. Electronically Signed   By: Genia Del M.D.   On: 02/22/2017 10:52   Dg Chest Portable 1 View  Result Date: 02/22/2017 CLINICAL DATA:  Hypoxia EXAM: PORTABLE CHEST 1 VIEW COMPARISON:  Study obtained earlier in the day. FINDINGS: Endotracheal tube tip is 3.2 cm above the carina. No pneumothorax. There is interstitial pulmonary edema with bibasilar atelectasis. No airspace consolidation. There is cardiomegaly  with pulmonary venous hypertension. There is aortic atherosclerosis. No adenopathy. No bone lesions. IMPRESSION: Endotracheal tube as described without pneumothorax. Changes of congestive heart failure. No consolidation. There is aortic atherosclerosis. Aortic Atherosclerosis (ICD10-I70.0). Electronically Signed   By: Lowella Grip III M.D.   On: 02/22/2017 09:06   Dg Chest Portable 1 View  Result Date: 02/22/2017 CLINICAL DATA:   Endotracheal tube placement EXAM: PORTABLE CHEST 1 VIEW COMPARISON:  01/27/2017 FINDINGS: Endotracheal tube in good position.  NG tip not well seen. Interval worsening of diffuse bilateral airspace disease most prominent right upper lobe. Possible edema versus pneumonia. No pleural effusion. Cardiac enlargement IMPRESSION: Endotracheal tube in good position. Significant worsening in bilateral airspace disease which may represent pneumonia or edema. Electronically Signed   By: Franchot Gallo M.D.   On: 02/22/2017 08:16   Dg Abd Portable 1v  Result Date: 02/23/2017 CLINICAL DATA:  Nasogastric tube placement EXAM: PORTABLE ABDOMEN - 1 VIEW COMPARISON:  Yesterday FINDINGS: Nasogastric tube tip overlaps the distal stomach. Central line with tip at the upper cavoatrial junction. Chest x-ray was obtained earlier today. Stable bowel gas pattern with mild gaseous distention of right colon and distal stomach. Cholecystectomy clips. Artifact from defibrillator pads. IMPRESSION: Nasogastric tube tip overlaps the  distal stomach. Electronically Signed   By: Monte Fantasia M.D.   On: 02/23/2017 09:25   Dg Abd Portable 1v  Result Date: 02/22/2017 CLINICAL DATA:  Orogastric tube placement EXAM: PORTABLE ABDOMEN - 1 VIEW COMPARISON:  None. FINDINGS: No appreciable orogastric tube. There is mild dilatation in the transverse colon region. No small bowel dilatation. No air-fluid level. No free air evident. There are surgical clips the right upper quadrant. IMPRESSION: No orogastric tube evident. Question a degree of colonic ileus. There does not appear to be bowel obstruction or free air. Electronically Signed   By: Lowella Grip III M.D.   On: 02/22/2017 10:51     CBC  Recent Labs Lab 02/24/17 0403 02/24/17 1702 02/25/17 0501 02/26/17 0827 02/27/17 0449 02/28/17 0339  WBC 17.5*  --  17.3* 14.2* 15.3* 14.1*  HGB 6.5* 7.8* 8.3* 8.6* 8.5* 8.1*  HCT 22.4* 25.7* 26.9* 28.2* 27.8* 26.6*  PLT 228  --  233 232 254  259  MCV 67.9*  --  67.6* 70.1* 68.6* 69.6*  MCH 19.9*  --  20.8* 21.2* 21.1* 21.3*  MCHC 29.3*  --  30.8* 30.3* 30.7* 30.6*  RDW 19.6*  --  20.1* 20.4* 20.9* 20.6*  LYMPHSABS  --   --   --  0.8*  --   --   MONOABS  --   --   --  1.3*  --   --   EOSABS  --   --   --  0.1  --   --   BASOSABS  --   --   --  0.1  --   --     Chemistries   Recent Labs Lab 02/22/17 1706  02/24/17 0403 02/25/17 0501 02/26/17 0827 02/27/17 0449 02/28/17 0339 03/01/17 0506  NA 137  < > 141 141 141 141  --  137  K 5.9*  < > 4.7 4.1 4.6 4.7 4.1 4.2  CL 105  < > 102 100* 100* 99*  --  97*  CO2 23  < > 29 30 32 32  --  29  GLUCOSE 422*  < > 115* 175* 185* 137*  --  169*  BUN 49*  < > 66* 53* 41* 37*  --  39*  CREATININE 2.32*  < > 2.77* 1.96* 1.57* 1.60* 1.56* 1.53*  CALCIUM 7.7*  < > 7.7* 8.6* 9.1 9.3  --  8.9  MG 2.2  --   --   --  2.2  --   --   --   AST  --   --  29 23  --   --   --   --   ALT  --   --  24 21  --   --   --   --   ALKPHOS  --   --  58 70  --   --   --   --   BILITOT  --   --  0.8 1.2  --   --   --   --   < > = values in this interval not displayed. ------------------------------------------------------------------------------------------------------------------ estimated creatinine clearance is 57.3 mL/min (A) (by C-G formula based on SCr of 1.53 mg/dL (H)). ------------------------------------------------------------------------------------------------------------------ No results for input(s): HGBA1C in the last 72 hours. ------------------------------------------------------------------------------------------------------------------ No results for input(s): CHOL, HDL, LDLCALC, TRIG, CHOLHDL, LDLDIRECT in the last 72 hours. ------------------------------------------------------------------------------------------------------------------ No results for input(s): TSH, T4TOTAL, T3FREE, THYROIDAB in the last 72 hours.  Invalid input(s):  FREET3 ------------------------------------------------------------------------------------------------------------------ No results for input(s): VITAMINB12, FOLATE, FERRITIN, TIBC, IRON, RETICCTPCT in the last 72 hours.  Coagulation profile  Recent Labs Lab 02/25/17 0501 02/26/17 0827 02/27/17 0449 02/28/17 0339 03/01/17 0506  INR 3.08 1.44 1.12 1.13 1.16    No results for input(s): DDIMER in the last 72 hours.  Cardiac Enzymes  Recent Labs Lab 02/22/17 1706 02/22/17 2243  TROPONINI 0.08* 0.09*   ------------------------------------------------------------------------------------------------------------------ Invalid input(s): POCBNP    Assessment & Plan   IMPRESSION AND PLAN: Patient is a 61 year old status post respiratory arrest  1. Acute respiratory failure with hypoxia  Due to pneumonia and CHF-  Completed  cefepime course Extubated. Echo in June revealed normal ejection fraction Pulmonary following, patient  remains on 4 L of oxygen  via nasal cannula, continue 40 mg of  Lasix intravenously daily, following creatinine , oxygenation, Chest x-ray has improved, as well as oxygenation, wean off oxygen  2. Acute diastolic CHF,  continue  intravenous Lasix,  stable creatinine, oxygenation. Weaning off oxygen as tolerated  3. Status post respiratory arrest Suspect due to her underlying lung issues.   4. Diabetes type 2 continue sliding scale insulin, glucose levels ranging between  136 to 230s  5. History of hypertension blood pressure stable  6. Hypothyroidism  TSH is suppressed Likely due to euthyroid sick syndrome  7. Morbid obesity, supportive therapy, the patient also has obstructive sleep apnea diagnosis, unclear if she is using CPAP at home  8.Acute renal failure on chronic kidney disease, baseline creatinine of 1.8 in May 2018, estimated GFR of 29, CKD stage IV, nephrology consult appreciated urine output stable renal function remains stable with  diuresis  9. A. fib, RVR, now on sinus rhythm, continue current dose of  amiodarone orally, cardiology consultation is appreciated. The patient is mildly bradycardic, decrease metoprolol dose to twice daily.  10 Leukocytosis,no significant improvement , follow  closely  11., A. fib, RVR, now in sinus rhythm on amiodarone, continue amiodarone, appreciate cardiology's input, continue Coumadin therapy, pharmacist, is consulted for Coumadin dosing, INR is 1.16 today      Code Status Orders        Start     Ordered   02/22/17 1004  Full code  Continuous     02/22/17 1003  Code Status History    Date Active Date Inactive Code Status Order ID Comments User Context   01/26/2017  4:36 PM 01/28/2017 12:34 PM Full Code [id number]  Baxter Hire, MD Inpatient           Consults Pulmonary critical care, nephrology  DVT Prophylaxis SCDs  Lab Results  Component Value Date   PLT 259 02/28/2017     Time Spent in minutes 20mn . Discussed with patient's family   Rogerio Boutelle M.D on 03/01/2017 at 12:28 PM  Between 7am to 6pm - Pager - [phone]  After 6pm go to [email or web address] - password [email or web address]   Office  [phone]

## 2017-03-01 NOTE — Progress Notes (Signed)
[facility]  ROUNDING NOTE   Subjective:   Husband at bedside.   Patient complains that she fell into bed  Objective:  Vital signs in last 24 hours:  Temp:  [97.7 F (36.5 C)-99.1 F (37.3 C)] 98 F (36.7 C) (07/24 0800) Pulse Rate:  [50-52] 50 (07/24 0416) Resp:  [16-18] 16 (07/24 0800) BP: (102-142)/(44-84) 130/44 (07/24 0800) SpO2:  [92 %-98 %] 98 % (07/24 0800)  Weight change:  Filed Weights   02/22/17 0813  Weight: (!) 146.1 kg (322 lb)    Intake/Output: I/O last 3 completed shifts: In: 720 [P.O.:720] Out: 1750 [Urine:1750]   Intake/Output this shift:  No intake/output data recorded.  Physical Exam: General: Sitting in bed  Head: [place]/AT hearing intact OM moist  Eyes: Anicteric  Neck: Supple, trachea midline  Lungs:  Bilateral rhonchi  Heart: S1S2 no rubs  Abdomen:  Soft, nontender, bowel sounds present  Extremities: no peripheral edema.  Neurologic: Awake, alert, follows commands  Skin: No lesions       Basic Metabolic Panel:  Recent Labs Lab 02/22/17 1706  02/24/17 0403 02/25/17 0501 02/26/17 0827 02/27/17 0449 02/28/17 0339 03/01/17 0506  NA 137  < > 141 141 141 141  --  137  K 5.9*  < > 4.7 4.1 4.6 4.7 4.1 4.2  CL 105  < > 102 100* 100* 99*  --  97*  CO2 23  < > 29 30 32 32  --  29  GLUCOSE 422*  < > 115* 175* 185* 137*  --  169*  BUN 49*  < > 66* 53* 41* 37*  --  39*  CREATININE 2.32*  < > 2.77* 1.96* 1.57* 1.60* 1.56* 1.53*  CALCIUM 7.7*  < > 7.7* 8.6* 9.1 9.3  --  8.9  MG 2.2  --   --   --  2.2  --   --   --   < > = values in this interval not displayed.  Liver Function Tests:  Recent Labs Lab 02/24/17 0403 02/25/17 0501  AST 29 23  ALT 24 21  ALKPHOS 58 70  BILITOT 0.8 1.2  PROT 6.4* 7.2  ALBUMIN 3.0* 3.0*   No results for input(s): LIPASE, AMYLASE in the last 168 hours. No results for input(s): AMMONIA in the last 168 hours.  CBC:  Recent Labs Lab 02/24/17 0403 02/24/17 1702 02/25/17 0501  02/26/17 0827 02/27/17 0449 02/28/17 0339  WBC 17.5*  --  17.3* 14.2* 15.3* 14.1*  NEUTROABS  --   --   --  11.9*  --   --   HGB 6.5* 7.8* 8.3* 8.6* 8.5* 8.1*  HCT 22.4* 25.7* 26.9* 28.2* 27.8* 26.6*  MCV 67.9*  --  67.6* 70.1* 68.6* 69.6*  PLT 228  --  233 232 254 259    Cardiac Enzymes:  Recent Labs Lab 02/22/17 1040 02/22/17 1706 02/22/17 2243  TROPONINI 0.04* 0.08* 0.09*    BNP: Invalid input(s): POCBNP  CBG:  Recent Labs Lab 02/28/17 0724 02/28/17 1136 02/28/17 1628 02/28/17 2029 03/01/17 0724  GLUCAP 131* 156* 193* 193* 155*    Microbiology: Results for orders placed or performed during the hospital encounter of 02/22/17  Blood Culture (routine x 2)     Status: None   Collection Time: 02/22/17  7:55 AM  Result Value Ref Range Status   Specimen Description BLOOD RIGHT AV  Final   Special Requests   Final    BOTTLES DRAWN AEROBIC AND ANAEROBIC Blood Culture  results may not be optimal due to an excessive volume of blood received in culture bottles   Culture NO GROWTH 5 DAYS  Final   Report Status 02/27/2017 FINAL  Final  Blood Culture (routine x 2)     Status: None   Collection Time: 02/22/17  8:15 AM  Result Value Ref Range Status   Specimen Description BLOOD RIGHT HAND  Final   Special Requests   Final    BOTTLES DRAWN AEROBIC AND ANAEROBIC Blood Culture results may not be optimal due to an excessive volume of blood received in culture bottles   Culture NO GROWTH 5 DAYS  Final   Report Status 02/27/2017 FINAL  Final  Culture, respiratory (NON-Expectorated)     Status: None   Collection Time: 02/22/17 10:00 AM  Result Value Ref Range Status   Specimen Description TRACHEAL ASPIRATE  Final   Special Requests Normal  Final   Gram Stain   Final    FEW WBC PRESENT,BOTH PMN AND MONONUCLEAR NO ORGANISMS SEEN    Culture   Final    Consistent with normal respiratory flora. Performed at [facility], [place] [address], [place], [place] [address]     Report Status 02/25/2017 FINAL  Final  MRSA PCR Screening     Status: None   Collection Time: 02/22/17 10:50 AM  Result Value Ref Range Status   MRSA by PCR NEGATIVE NEGATIVE Final    Comment:        The GeneXpert MRSA Assay (FDA approved for NASAL specimens only), is one component of a comprehensive MRSA colonization surveillance program. It is not intended to diagnose MRSA infection nor to guide or monitor treatment for MRSA infections.   Urine culture     Status: None   Collection Time: 02/22/17 11:26 AM  Result Value Ref Range Status   Specimen Description URINE, CATHETERIZED  Final   Special Requests NONE  Final   Culture   Final    NO GROWTH Performed at [facility], [address] [place], [place] [address]    Report Status 02/24/2017 FINAL  Final    Coagulation Studies:  Recent Labs  02/27/17 0449 02/28/17 0339 03/01/17 0506  LABPROT 14.5 14.6 14.9  INR 1.12 1.13 1.16    Urinalysis: No results for input(s): COLORURINE, LABSPEC, PHURINE, GLUCOSEU, HGBUR, BILIRUBINUR, KETONESUR, PROTEINUR, UROBILINOGEN, NITRITE, LEUKOCYTESUR in the last 72 hours.  Invalid input(s): APPERANCEUR    Imaging: No results found.   Medications:    . amiodarone  400 mg Oral Daily  . chlorhexidine gluconate (MEDLINE KIT)  15 mL Mouth Rinse BID  . citalopram  20 mg Oral Daily  . furosemide  40 mg Intravenous Daily  . insulin aspart  0-5 Units Subcutaneous QHS  . insulin aspart  0-9 Units Subcutaneous TID WC  . insulin glargine  20 Units Subcutaneous QHS  . loratadine  10 mg Oral Daily  . mouth rinse  15 mL Mouth Rinse BID  . metoprolol tartrate  25 mg Oral Q6H  . simethicone  80 mg Oral QID  . sodium chloride flush  3 mL Intravenous Q12H  . warfarin  10 mg Oral ONCE-1800  . Warfarin - Pharmacist Dosing Inpatient   Does not apply q1800   acetaminophen **OR** [DISCONTINUED] acetaminophen, [DISCONTINUED] ondansetron **OR** ondansetron (ZOFRAN) IV, oxyCODONE,  simethicone, sodium chloride flush  Assessment/ Plan:  61 y.o. white female with hypothyroidism, atrial fibrillation, diabetes mellitus type 2, hypertension, obesity, GERD, osteoporosis, asthma, allergic rhinitis, chronic  back pain, hyperlipidemia, B12 deficiency, and obstructive sleep apnea who was admitted with PEA arrest.    1. Acute renal failure with hyperkalemia on Chronic kidney disease stage IV with proteinuria secondary diabetes mellitus type 2 and NSAID abuse Baseline creatinine 1.89 GFR of 29 02/11/17 Acute renal failure from cardio-renal syndrome Chronic kidney disease secondary to diabetic nephropathy.  - Creatinine back to baseline.   2. Hypertension: blood pressure at goal.  - furosemide, metoprolol  3. Diabetes mellitus type II with chronic kidney disease: on metformin. Insulin dependent. Hemoglobin A1c 6.7% 11/12/16 - continue glucose control.   4. Acute exacerbation of diastolic congestive heart failure:  - continue IV furosemide   LOS: [place], [place] 7/24/201810:03 AM

## 2017-03-01 NOTE — Progress Notes (Signed)
ANTICOAGULATION CONSULT NOTE - Follow Up Consult  Pharmacy Consult for warfarin dosing Indication: atrial fibrillation  Allergies  Allergen Reactions  . Other Anaphylaxis    ILOSONE,  Caused GI distress.    Patient Measurements: Height: '5\' 6"'  (167.6 cm) Weight: (!) 322 lb (146.1 kg) IBW/kg (Calculated) : 59.3  Vital Signs: Temp: 97.7 F (36.5 C) (07/24 0416) Temp Source: Oral (07/24 0416) BP: 142/84 (07/24 0416) Pulse Rate: 50 (07/24 0416)  Labs:  Recent Labs  02/26/17 0827 02/27/17 0449 02/28/17 0339 03/01/17 0506  HGB 8.6* 8.5* 8.1*  --   HCT 28.2* 27.8* 26.6*  --   PLT 232 254 259  --   LABPROT 17.7* 14.5 14.6 14.9  INR 1.44 1.12 1.13 1.16  CREATININE 1.57* 1.60* 1.56* 1.53*    Estimated Creatinine Clearance: 57.3 mL/min (A) (by C-G formula based on SCr of 1.53 mg/dL (H)).   Medications:  Scheduled:  . amiodarone  400 mg Oral Daily  . chlorhexidine gluconate (MEDLINE KIT)  15 mL Mouth Rinse BID  . citalopram  20 mg Oral Daily  . furosemide  40 mg Intravenous Daily  . insulin aspart  0-5 Units Subcutaneous QHS  . insulin aspart  0-9 Units Subcutaneous TID WC  . insulin glargine  20 Units Subcutaneous QHS  . loratadine  10 mg Oral Daily  . mouth rinse  15 mL Mouth Rinse BID  . metoprolol tartrate  25 mg Oral Q6H  . simethicone  80 mg Oral QID  . sodium chloride flush  3 mL Intravenous Q12H  . Warfarin - Pharmacist Dosing Inpatient   Does not apply q1800   Infusions:   PRN: acetaminophen **OR** [DISCONTINUED] acetaminophen, [DISCONTINUED] ondansetron **OR** ondansetron (ZOFRAN) IV, oxyCODONE, simethicone, sodium chloride flush  Assessment: 61 y/o female admitted with acute respiratory failure, pt INR supratherapeutic prior to admission, home dose 7.62m daily, but on fluconazole per PTA pharmacy records. Patient is s/p vitamin k 136msubq 7/19. Patient is on amiodarone this admission.    Goal of Therapy:  INR 2-3 Monitor platelets by anticoagulation  protocol: Yes   Plan:  INR remaining stable and subtherapeutic, will increase. Warfarin 1029m 1 dose tonight. Will monitor INR and CBC moving forward AM labs. CBC ordered for 7/25 due to stable anemia close monitoring.   GarDonna Christenffee 03/01/2017,8:09 AM

## 2017-03-01 NOTE — Progress Notes (Signed)
Nutrition Follow-up  DOCUMENTATION CODES:   Morbid obesity  INTERVENTION:  Monitor for needs, cater to patient preferences  NUTRITION DIAGNOSIS:   Inadequate oral intake related to inability to eat as evidenced by NPO status. -resolved  GOAL:   Patient will meet greater than or equal to 90% of their needs -progressing, likely meeting  MONITOR:   I & O's, Labs, PO intake, Weight trends  ASSESSMENT:   61 year old female with PMHx of sleep apnea on CPAP at home, HTN, A-fib, GERD, DM type 2, CKD, hypothyroidism, anxiety, COPD, who had dyspnea overnight. Suffered cardiopulmonary arrest en route to hospital, underwent resuscitation and intubation in ED on 7/17. Upon arrival to ICU had another PEA arrest. Spoke with patient and husband at bedside. She was transferring from bed to chair with the help of NTs, RN during visit. Appetite is good, PO intake is good, patient had a wrap, cheerios and coffee this morning she ate 100% of. No acute complaints. Husband was very complementary of food. 9.3 L fluid negative, 1732mL UOP last 24hrs No new wts Meal Completion: 75-100% Labs reviewed:  CBGs 243, 155, 193 Medications reviewed and include:  Amiodarone 400mg  Novolog 0-9 Units TID with meals, 0-5 Units HS; Lantus 20 units HS  Diet Order:  Diet regular Room service appropriate? Yes; Fluid consistency: Thin  Skin:  Reviewed, no issues  Last BM:  02/27/2017 (Type 7)  Height:   Ht Readings from Last 1 Encounters:  02/22/17 5\' 6"  (1.676 m)    Weight:   Wt Readings from Last 1 Encounters:  02/22/17 (!) 322 lb (146.1 kg)    Ideal Body Weight:  59.1 kg  BMI:  Body mass index is 51.97 kg/m.  Estimated Nutritional Needs:   Kcal:  2100-2400kcal/day (MSJ x 1.1-1.3)  Protein:  109-136g/day   Fluid:  >2L/day   EDUCATION NEEDS:   No education needs identified at this time  Satira Anis. Huda Petrey, MS, RD LDN Inpatient Clinical Dietitian Pager [phone]

## 2017-03-01 NOTE — Progress Notes (Signed)
SLP Note  Patient Details Name: Andrea Rivers MRN: [id number] DOB: Aug 14, 1955   SLP Note:  Reviewed chart and spoke with Nsg and pt. Pt and nsg report that she has been tolerating Dysphagia III diet w/thin liquids. No reports of s/s of aspiration during meals. Recommend continue w/Dysphagia III diet w/thin liquids and general aspiration precautions. Will f/u in 1-2 days and will likely sign off if pt continues to do well on diet.       Charlean Sanfilippo, [place], [employer]  Speech-Language Pathologist  [place] 03/01/2017, 1:59 PM

## 2017-03-02 ENCOUNTER — Inpatient Hospital Stay: Payer: Medicare Other

## 2017-03-02 LAB — CBC
HEMATOCRIT: 28.8 % — AB (ref 35.0–47.0)
Hemoglobin: 8.6 g/dL — ABNORMAL LOW (ref 12.0–16.0)
MCH: 21 pg — ABNORMAL LOW (ref 26.0–34.0)
MCHC: 29.9 g/dL — AB (ref 32.0–36.0)
MCV: 70.2 fL — AB (ref 80.0–100.0)
Platelets: 286 10*3/uL (ref 150–440)
RBC: 4.1 MIL/uL (ref 3.80–5.20)
RDW: 20.5 % — AB (ref 11.5–14.5)
WBC: 13.1 10*3/uL — ABNORMAL HIGH (ref 3.6–11.0)

## 2017-03-02 LAB — GLUCOSE, CAPILLARY
GLUCOSE-CAPILLARY: 202 mg/dL — AB (ref 65–99)
Glucose-Capillary: 167 mg/dL — ABNORMAL HIGH (ref 65–99)
Glucose-Capillary: 211 mg/dL — ABNORMAL HIGH (ref 65–99)
Glucose-Capillary: 257 mg/dL — ABNORMAL HIGH (ref 65–99)

## 2017-03-02 LAB — PROTIME-INR
INR: 1.1
Prothrombin Time: 14.2 seconds (ref 11.4–15.2)

## 2017-03-02 MED ORDER — WARFARIN SODIUM 10 MG PO TABS
10.0000 mg | ORAL_TABLET | Freq: Once | ORAL | Status: AC
  Administered 2017-03-02: 10 mg via ORAL
  Filled 2017-03-02: qty 1

## 2017-03-02 NOTE — Care Management Important Message (Signed)
Important Message  Patient Details  Name: Andrea Rivers MRN: [id number] Date of Birth: 04/06/1956   Medicare Important Message Given:  Yes    Katrina Stack, RN 03/02/2017, 8:34 AM

## 2017-03-02 NOTE — Progress Notes (Signed)
OT Cancellation Note  Patient Details Name: Andrea Rivers MRN: [id number] DOB: [birth year]   Cancelled Treatment:     1st attempt, patient with PT.  2nd attempt, patient going to xray.  Will continue attempts.  Alyla Pietila T Joice Nazario, OTR/L, CLT   Marsden Zaino 03/02/2017, 5:06 PM

## 2017-03-02 NOTE — Progress Notes (Signed)
[facility]  ROUNDING NOTE   Subjective:   Husband at bedside.   Shortness of breath improved.   Objective:  Vital signs in last 24 hours:  Temp:  [98.1 F (36.7 C)-98.8 F (37.1 C)] 98.8 F (37.1 C) (07/25 0432) Pulse Rate:  [59] 59 (07/25 0432) Resp:  [18] 18 (07/25 0432) BP: (126-132)/(52) 126/52 (07/25 0432) SpO2:  [90 %-92 %] 92 % (07/25 0432)  Weight change:  Filed Weights   02/22/17 0813  Weight: (!) 146.1 kg (322 lb)    Intake/Output: I/O last 3 completed shifts: In: 480 [P.O.:480] Out: 2251 [Urine:2250; Stool:1]   Intake/Output this shift:  No intake/output data recorded.  Physical Exam: General: Sitting in bed  Head: /AT hearing intact OM moist  Eyes: Anicteric  Neck: Supple, trachea midline  Lungs:  Bilateral rhonchi  Heart: S1S2 no rubs  Abdomen:  Soft, nontender, bowel sounds present  Extremities: no peripheral edema.  Neurologic: Awake, alert, follows commands  Skin: No lesions       Basic Metabolic Panel:  Recent Labs Lab 02/24/17 0403 02/25/17 0501 02/26/17 0827 02/27/17 0449 02/28/17 0339 03/01/17 0506  NA 141 141 141 141  --  137  K 4.7 4.1 4.6 4.7 4.1 4.2  CL 102 100* 100* 99*  --  97*  CO2 29 30 32 32  --  29  GLUCOSE 115* 175* 185* 137*  --  169*  BUN 66* 53* 41* 37*  --  39*  CREATININE 2.77* 1.96* 1.57* 1.60* 1.56* 1.53*  CALCIUM 7.7* 8.6* 9.1 9.3  --  8.9  MG  --   --  2.2  --   --   --     Liver Function Tests:  Recent Labs Lab 02/24/17 0403 02/25/17 0501  AST 29 23  ALT 24 21  ALKPHOS 58 70  BILITOT 0.8 1.2  PROT 6.4* 7.2  ALBUMIN 3.0* 3.0*   No results for input(s): LIPASE, AMYLASE in the last 168 hours. No results for input(s): AMMONIA in the last 168 hours.  CBC:  Recent Labs Lab 02/25/17 0501 02/26/17 0827 02/27/17 0449 02/28/17 0339 03/02/17 0358  WBC 17.3* 14.2* 15.3* 14.1* 13.1*  NEUTROABS  --  11.9*  --   --   --   HGB 8.3* 8.6* 8.5* 8.1* 8.6*  HCT 26.9* 28.2* 27.8* 26.6*  28.8*  MCV 67.6* 70.1* 68.6* 69.6* 70.2*  PLT 233 232 254 259 286    Cardiac Enzymes: No results for input(s): CKTOTAL, CKMB, CKMBINDEX, TROPONINI in the last 168 hours.  BNP: Invalid input(s): POCBNP  CBG:  Recent Labs Lab 03/01/17 1128 03/01/17 1650 03/01/17 2159 03/02/17 0738 03/02/17 1143  GLUCAP 243* 316* 219* 167* 202*    Microbiology: Results for orders placed or performed during the hospital encounter of 02/22/17  Blood Culture (routine x 2)     Status: None   Collection Time: 02/22/17  7:55 AM  Result Value Ref Range Status   Specimen Description BLOOD RIGHT AV  Final   Special Requests   Final    BOTTLES DRAWN AEROBIC AND ANAEROBIC Blood Culture results may not be optimal due to an excessive volume of blood received in culture bottles   Culture NO GROWTH 5 DAYS  Final   Report Status 02/27/2017 FINAL  Final  Blood Culture (routine x 2)     Status: None   Collection Time: 02/22/17  8:15 AM  Result Value Ref Range Status   Specimen Description BLOOD RIGHT HAND  Final  Special Requests   Final    BOTTLES DRAWN AEROBIC AND ANAEROBIC Blood Culture results may not be optimal due to an excessive volume of blood received in culture bottles   Culture NO GROWTH 5 DAYS  Final   Report Status 02/27/2017 FINAL  Final  Culture, respiratory (NON-Expectorated)     Status: None   Collection Time: 02/22/17 10:00 AM  Result Value Ref Range Status   Specimen Description TRACHEAL ASPIRATE  Final   Special Requests Normal  Final   Gram Stain   Final    FEW WBC PRESENT,BOTH PMN AND MONONUCLEAR NO ORGANISMS SEEN    Culture   Final    Consistent with normal respiratory flora. Performed at [facility], [place] [address], [place], [place] [address]    Report Status 02/25/2017 FINAL  Final  MRSA PCR Screening     Status: None   Collection Time: 02/22/17 10:50 AM  Result Value Ref Range Status   MRSA by PCR NEGATIVE NEGATIVE Final    Comment:        The GeneXpert MRSA  Assay (FDA approved for NASAL specimens only), is one component of a comprehensive MRSA colonization surveillance program. It is not intended to diagnose MRSA infection nor to guide or monitor treatment for MRSA infections.   Urine culture     Status: None   Collection Time: 02/22/17 11:26 AM  Result Value Ref Range Status   Specimen Description URINE, CATHETERIZED  Final   Special Requests NONE  Final   Culture   Final    NO GROWTH Performed at [facility], [address] [place], [place] [address]    Report Status 02/24/2017 FINAL  Final    Coagulation Studies:  Recent Labs  02/28/17 0339 03/01/17 0506 03/02/17 0358  LABPROT 14.6 14.9 14.2  INR 1.13 1.16 1.10    Urinalysis: No results for input(s): COLORURINE, LABSPEC, PHURINE, GLUCOSEU, HGBUR, BILIRUBINUR, KETONESUR, PROTEINUR, UROBILINOGEN, NITRITE, LEUKOCYTESUR in the last 72 hours.  Invalid input(s): APPERANCEUR    Imaging: No results found.   Medications:    . amiodarone  400 mg Oral Daily  . chlorhexidine gluconate (MEDLINE KIT)  15 mL Mouth Rinse BID  . citalopram  20 mg Oral Daily  . furosemide  40 mg Intravenous Daily  . insulin aspart  0-5 Units Subcutaneous QHS  . insulin aspart  0-9 Units Subcutaneous TID WC  . insulin glargine  25 Units Subcutaneous QHS  . lidocaine  1 patch Transdermal Q24H  . loratadine  10 mg Oral Daily  . mouth rinse  15 mL Mouth Rinse BID  . metoprolol tartrate  25 mg Oral BID  . simethicone  80 mg Oral QID  . sodium chloride flush  3 mL Intravenous Q12H  . warfarin  10 mg Oral ONCE-1800  . Warfarin - Pharmacist Dosing Inpatient   Does not apply q1800   acetaminophen **OR** [DISCONTINUED] acetaminophen, [DISCONTINUED] ondansetron **OR** ondansetron (ZOFRAN) IV, oxyCODONE, simethicone, sodium chloride flush  Assessment/ Plan:  61 y.o. white female with hypothyroidism, atrial fibrillation, diabetes mellitus type 2, hypertension, obesity, GERD, osteoporosis,  asthma, allergic rhinitis, chronic back pain, hyperlipidemia, B12 deficiency, and obstructive sleep apnea who was admitted with PEA arrest.    1. Acute renal failure with hyperkalemia on Chronic kidney disease stage IV with proteinuria secondary diabetes mellitus type 2 and NSAID abuse Baseline creatinine 1.89 GFR of 29 02/11/17 Acute renal failure from cardio-renal syndrome Chronic kidney disease secondary to diabetic nephropathy.  -  Creatinine back to baseline.   2. Hypertension: blood pressure at goal.  - furosemide, metoprolol  3. Diabetes mellitus type II with chronic kidney disease: on metformin. Insulin dependent. Hemoglobin A1c 6.7% 11/12/16 - continue glucose control.   4. Acute exacerbation of diastolic congestive heart failure:  - continue IV furosemide   LOS: 8 Tokiko Diefenderfer 7/25/201812:52 PM

## 2017-03-02 NOTE — Progress Notes (Signed)
ANTICOAGULATION CONSULT NOTE - Follow Up Consult  Pharmacy Consult for warfarin dosing Indication: atrial fibrillation  Allergies  Allergen Reactions  . Other Anaphylaxis    ILOSONE,  Caused GI distress.    Patient Measurements: Height: '5\' 6"'  (167.6 cm) Weight: (!) 322 lb (146.1 kg) IBW/kg (Calculated) : 59.3  Vital Signs: Temp: 98.8 F (37.1 C) (07/25 0432) Temp Source: Oral (07/25 0432) BP: 126/52 (07/25 0432) Pulse Rate: 59 (07/25 0432)  Labs:  Recent Labs  02/28/17 0339 03/01/17 0506 03/02/17 0358  HGB 8.1*  --  8.6*  HCT 26.6*  --  28.8*  PLT 259  --  286  LABPROT 14.6 14.9 14.2  INR 1.13 1.16 1.10  CREATININE 1.56* 1.53*  --     Estimated Creatinine Clearance: 57.3 mL/min (A) (by C-G formula based on SCr of 1.53 mg/dL (H)).   Medications:  Scheduled:  . amiodarone  400 mg Oral Daily  . chlorhexidine gluconate (MEDLINE KIT)  15 mL Mouth Rinse BID  . citalopram  20 mg Oral Daily  . furosemide  40 mg Intravenous Daily  . insulin aspart  0-5 Units Subcutaneous QHS  . insulin aspart  0-9 Units Subcutaneous TID WC  . insulin glargine  25 Units Subcutaneous QHS  . lidocaine  1 patch Transdermal Q24H  . loratadine  10 mg Oral Daily  . mouth rinse  15 mL Mouth Rinse BID  . metoprolol tartrate  25 mg Oral BID  . simethicone  80 mg Oral QID  . sodium chloride flush  3 mL Intravenous Q12H  . Warfarin - Pharmacist Dosing Inpatient   Does not apply q1800   Infusions:   PRN: acetaminophen **OR** [DISCONTINUED] acetaminophen, [DISCONTINUED] ondansetron **OR** ondansetron (ZOFRAN) IV, oxyCODONE, simethicone, sodium chloride flush  Assessment: 61 y/o female admitted with acute respiratory failure, pt INR supratherapeutic prior to admission, home dose 7.3m daily, but on fluconazole per PTA pharmacy records. Patient is s/p vitamin k 140msubq 7/19. Patient is on amiodarone this admission.    Goal of Therapy:  INR 2-3 Monitor platelets by anticoagulation  protocol: Yes   Plan:  INR remaining stable and subtherapeutic, will repeat. Warfarin 1034m 1 dose tonight. Will monitor INR and CBC moving forward AM labs. CBC ordered for 7/25 due to stable anemia close monitoring.   Cherise Fedder 03/02/2017,7:11 AM

## 2017-03-02 NOTE — Progress Notes (Signed)
[facility] NAME: Andrea Rivers    MR#:  [id number]  DATE OF BIRTH:  04-28-1956  SUBJECTIVE:  CHIEF COMPLAINT:   Chief Complaint  Patient presents with  . Respiratory Arrest   -Admitted status post cardiac arrest, acute on chronic congestive heart failure exacerbation -Still requiring 2 L oxygen and on IV Lasix at this time. -Improving slowly  REVIEW OF SYSTEMS:  Review of Systems  Constitutional: Positive for malaise/fatigue. Negative for chills and fever.  HENT: Negative for congestion, ear discharge, hearing loss and nosebleeds.   Eyes: Negative for blurred vision and double vision.  Respiratory: Positive for shortness of breath. Negative for cough and wheezing.   Cardiovascular: Negative for chest pain and palpitations.  Gastrointestinal: Negative for abdominal pain, constipation, diarrhea, nausea and vomiting.  Genitourinary: Negative for dysuria.  Musculoskeletal: Negative for myalgias.  Neurological: Negative for dizziness, speech change, focal weakness, seizures and headaches.  Psychiatric/Behavioral: Negative for depression.    DRUG ALLERGIES:   Allergies  Allergen Reactions  . Other Anaphylaxis    ILOSONE,  Caused GI distress.    VITALS:  Blood pressure (!) 126/52, pulse (!) 59, temperature 98.8 F (37.1 C), temperature source Oral, resp. rate 18, height 5\' 6"  (1.676 m), weight (!) 146.1 kg (322 lb), SpO2 92 %.  PHYSICAL EXAMINATION:  Physical Exam  GENERAL:  61 y.o.-year-old Obese patient lying in the bed with no acute distress.  EYES: Pupils equal, round, reactive to light and accommodation. No scleral icterus. Extraocular muscles intact.  HEENT: Head atraumatic, normocephalic. Oropharynx and nasopharynx clear.  NECK:  Supple, no jugular venous distention. No thyroid enlargement, no tenderness.  LUNGS: Normal breath sounds bilaterally, no wheezing, rales,rhonchi or crepitation. No use of accessory  muscles of respiration. Decreased bibasilar breath sounds with fine crackles at the bases CARDIOVASCULAR: S1, S2 normal. No murmurs, rubs, or gallops.  ABDOMEN: Soft, nontender, nondistended. Bowel sounds present. No organomegaly or mass.  EXTREMITIES: No cyanosis, or clubbing. 1+ bilateral leg edema, right greater than left NEUROLOGIC: Cranial nerves II through XII are intact. Muscle strength 5/5 in all extremities. Sensation intact. Gait not checked.  PSYCHIATRIC: The patient is alert and oriented x 3.  SKIN: No obvious rash, lesion, or ulcer.    LABORATORY PANEL:   CBC  Recent Labs Lab 03/02/17 0358  WBC 13.1*  HGB 8.6*  HCT 28.8*  PLT 286   ------------------------------------------------------------------------------------------------------------------  Chemistries   Recent Labs Lab 02/25/17 0501 02/26/17 0827  03/01/17 0506  NA 141 141  < > 137  K 4.1 4.6  < > 4.2  CL 100* 100*  < > 97*  CO2 30 32  < > 29  GLUCOSE 175* 185*  < > 169*  BUN 53* 41*  < > 39*  CREATININE 1.96* 1.57*  < > 1.53*  CALCIUM 8.6* 9.1  < > 8.9  MG  --  2.2  --   --   AST 23  --   --   --   ALT 21  --   --   --   ALKPHOS 70  --   --   --   BILITOT 1.2  --   --   --   < > = values in this interval not displayed. ------------------------------------------------------------------------------------------------------------------  Cardiac Enzymes No results for input(s): TROPONINI in the last 168 hours. ------------------------------------------------------------------------------------------------------------------  RADIOLOGY:  No results found.  EKG:   Orders placed or performed during the hospital encounter  of 02/22/17  . EKG 12-Lead  . EKG 12-Lead  . ED EKG 12-Lead  . ED EKG 12-Lead  . EKG 12-Lead  . EKG 12-Lead    ASSESSMENT AND PLAN:   61 year old obese female with past medical history significant for hypothyroidism, atrial fibrillation, type 2 diabetes mellitus,  osteoporosis, GERD, hypertension, chronic low back pain, diastolic CHF and obstructive sleep apnea admitted with PEA arrest.  #1 acute respiratory failure-secondary to diastolic CHF exacerbation. -Acute respiratory failure resulting in the cardiac arrest. -Initially admitted to ICU, intubated and required mechanical ventilation. -Not on home oxygen, currently extubated and weaned down to 2 L nasal cannula. -Continue IV Lasix for 1 more day. Follow-up chest x-ray today. -Appreciate pulmonary critical care consultation  #2 acute renal failure on CK D-baseline creatinine around 1.8. -Nephrology consulted. Monitor closely. Has cardiorenal syndrome. -Continue to check daily weights, urine output at this time.  #3 A. fib with rapid ventricular response-has known history of paroxysmal A. fib. Triggered by her hypoxia on admission. Converted to sinus rhythm. -Continue oral amiodarone. Also on metoprolol. -On Coumadin for anticoagulation.  #4 acute on chronic anemia-stable. Keep hemoglobin greater than 8 with recent cardiorespiratory arrest. No indication for transfusion at this time.  #5 DVT prophylaxis-continue Coumadin   All the records are reviewed and case discussed with Care Management/Social Workerr. Management plans discussed with the patient, family and they are in agreement.  CODE STATUS: Full code  TOTAL TIME TAKING CARE OF THIS PATIENT: 37 minutes.   POSSIBLE D/C IN 1-2 DAYS, DEPENDING ON CLINICAL CONDITION.   Yoshua Geisinger M.D on 03/02/2017 at 3:42 PM  Between 7am to 6pm - Pager - [phone]  After 6pm go to [email or web address] - password [email or web address] [facility]  Office  [phone]  CC: Primary care physician; [place] Martes Victoriano Lain, NP

## 2017-03-02 NOTE — Plan of Care (Signed)
Problem: Safety: Goal: Ability to remain free from injury will improve Outcome: Progressing Pt will remain injury free while on this unit, exit alarm is activated, patient is being assisted with ambulation with NT and walker. Pt is encouraged to call for assistance with activity.

## 2017-03-02 NOTE — Progress Notes (Signed)
Physical Therapy Treatment Patient Details Name: Andrea Rivers MRN: [id number] DOB: [birth year] Today's Date: 03/02/2017    History of Present Illness Pt. is a 61 y.o. female who was admitted to [facility] with Respiratory Distress on 7/17. Pt. was resuscitaed 2 x's upon admission. Pt. was intubated, and extubated on 7/19. PMHx includes: COPD, Lumbar Fractures in 1999, sleep apnea, Type II  DM, morbid obesity, hypothyroidism, HTN, Neuropathy, Anxiety, Orthopenia, and vertigo.    PT Comments    Pt pleasant and eager to participate for return to PLOF. Requires MinA +2 for physical assist with tranfers and for safety of chair follow with ambulation. Pt amb total of 50 ft with b UE from RW, requiring toileting rest break after 25 ft.. Pt O2 sat at 87% following 25 ft, which raised to >90% with seated rest and cues on pursed lipped breathing. Pt presents with the following deficits: strength, balance, and endurance. Overall, pt responded well to today's treatment with no adverse affects, and is progressing well towards functional goals. Pt would benefit from skilled PT to address the previously mentioned impairments and promote return to PLOF. Currently recommending SNF, pending d/c.     Follow Up Recommendations  SNF     Equipment Recommendations  Rolling walker with 5" wheels    Recommendations for Other Services       Precautions / Restrictions Precautions Precautions: Fall Restrictions Weight Bearing Restrictions: No    Mobility  Bed Mobility               General bed mobility comments: Not performed, as pt was in chair upon arrival, and requested stay in chair.   Transfers Overall transfer level: Needs assistance Equipment used: Rolling walker (2 wheeled) Transfers: Sit to/from Stand Sit to Stand: Min assist;+2 physical assistance;Mod assist         General transfer comment: Pt initially requiring ModA +2, progressing to MinA +2 with repeated STS transfers. SPT education  re: correct body mechanics to optimize quality of mobility. Pt responded well and demo good carryover.    Ambulation/Gait Ambulation/Gait assistance: (P) +2 safety/equipment;Min assist Ambulation Distance (Feet): 25 Feet Assistive device: Rolling walker (2 wheeled) Gait Pattern/deviations: Decreased step length - right;Decreased step length - left;Step-through pattern     General Gait Details: Pt requires minA with second person for chair follow for saftey. Pt on 2 L supplemental O2 during amb. Pt required min cues for safety with navigating O2 nasal cannula. Pt o2 sat dropped to 87%, though quick to recovery with cues to perform pursed lipped breathing.    Stairs            Wheelchair Mobility    Modified Rankin (Stroke Patients Only)       Balance                                            Cognition Arousal/Alertness: Awake/alert Behavior During Therapy: Anxious Overall Cognitive Status: Within Functional Limits for tasks assessed                                        Exercises Other Exercises Other Exercises: Seated therex performed with supervision to B LE's x20 reps: ankle pumps, quad sets, SAQ's, and glute sets. Pt on 2 L supplemental O2 during therex, maintaining  O2 >90%. Other Exercises: Toileting: Pt able to stand, but required modA with hygiene and clothing.     General Comments        Pertinent Vitals/Pain Pain Assessment: No/denies pain    Home Living                      Prior Function            PT Goals (current goals can now be found in the care plan section) Acute Rehab PT Goals Patient Stated Goal: To return home PT Goal Formulation: With patient Time For Goal Achievement: 03/14/17 Potential to Achieve Goals: Good Progress towards PT goals: Progressing toward goals    Frequency    Min 2X/week      PT Plan Current plan remains appropriate    Co-evaluation              AM-PAC  PT "6 Clicks" Daily Activity  Outcome Measure  Difficulty turning over in bed (including adjusting bedclothes, sheets and blankets)?: Total Difficulty moving from lying on back to sitting on the side of the bed? : Total Difficulty sitting down on and standing up from a chair with arms (e.g., wheelchair, bedside commode, etc,.)?: Total Help needed moving to and from a bed to chair (including a wheelchair)?: A Little Help needed walking in hospital room?: A Little Help needed climbing 3-5 steps with a railing? : Total 6 Click Score: 10    End of Session Equipment Utilized During Treatment: Gait belt;Oxygen Activity Tolerance: Other (comment);Patient limited by pain Patient left: in chair;with call bell/phone within reach;with chair alarm set;with family/visitor present Nurse Communication: Mobility status PT Visit Diagnosis: Unsteadiness on feet (R26.81);Other abnormalities of gait and mobility (R26.89);Muscle weakness (generalized) (M62.81);Pain     Time: 1761-6073 PT Time Calculation (min) (ACUTE ONLY): 39 min  Charges:                       G Codes:       Oran Rein PT, SPT  Bevelyn Ngo 03/02/2017, 3:21 PM

## 2017-03-02 NOTE — Progress Notes (Signed)
Patient wearing 4L 02 on intiial assessment. Declined to continue wearing oxygen due to it causing her nose to run and change the color of her mucus which leads her to get bronchitis and antibiotics. Oxygen saturation above 90% at rest. Encouraged patient to wear oxygen with exertion which she agreed to do. Will monitor.

## 2017-03-02 NOTE — Progress Notes (Signed)
Inpatient Diabetes Program Recommendations  AACE/ADA: New Consensus Statement on Inpatient Glycemic Control (2015)  Target Ranges:  Prepandial:   less than 140 mg/dL      [place] postprandial:   less than 180 mg/dL (1-2 hours)      Critically ill patients:  140 - 180 mg/dL   Results for CARSON, MECHE (MRN [id number]) as of 03/02/2017 12:57  Ref. Range 03/01/2017 07:24 03/01/2017 11:28 03/01/2017 16:50 03/01/2017 21:59 03/02/2017 07:38 03/02/2017 11:43  Glucose-Capillary Latest Ref Range: 65 - 99 mg/dL 155 (H) 243 (H) 316 (H) 219 (H) 167 (H) 202 (H)   Review of Glycemic Control  Diabetes history: DM 2 Outpatient Diabetes medications: Amaryl 2 mg Daily, Lantus 12 units QHS, Humalog 40 units BID with meals, Tradjenta 5 mg Daily, Metformin 1000 mg Breakfast, 500 mg lunch, 1000 mg Supper Current orders for Inpatient glycemic control: Lantus 25 units QHS, Novolog Sensitive Correction 0-9 units tid + Novolog HS scale 0-5 units  Inpatient Diabetes Program Recommendations:  Glucose increases at meal times.  Consider Novolog 3 units tid meal coverage tid in addition to correction scale if patient is consuming at least 50% of meals.  Please also consider adjusting diet to carb modified.  Thanks,  Tama Headings RN, MSN, [facility] Inpatient Diabetes Coordinator Team Pager [phone] (8a-5p)

## 2017-03-02 NOTE — Progress Notes (Signed)
Speech Language Pathology Dysphagia Treatment Patient Details Name: Andrea Rivers MRN: [id number] DOB: [birth year] Today's Date: 03/02/2017 Time: 2951-8841 SLP Time Calculation (min) (ACUTE ONLY): 23 min  Assessment / Plan / Recommendation Clinical Impression   pt was administered trials of regular with thin liquids to assess safety with current recommended diet. Pt was able to intake 100% trials with no overt ssx aspiration noted. Pt had adequate mastication and timely a-p transit. Pt was able to verbally state swallow strategies such as alternating solids and liquids and utilizing liquid wash post intake to ensure clearance of oral cavity. Pt recommended to remain on current diet of regular with thin and educated pt to notify slp if changes in condition occur. Pt verbally agreed to plan. NSG notified of slp sign off.      Diet Recommendation    Regular with thin   SLP Plan Discharge SLP treatment due to (comment)   Pertinent Vitals/Pain None reported`   Swallowing Goals     General Behavior/Cognition: Alert;Cooperative;Pleasant mood Patient Positioning: Upright in bed Oral care provided: N/A HPI:  Andrea Rivers  is a 61 y.o. female with a known history of COPD, sleep apnea, diabetes type 2, morbid obesity, hypothyroidism and hypertension presents with acute respiratory failure. EMS was called for shortness of breath. When EMS got there patient was in respiratory distress. She was initially placed on CPAP. And then on the way to the hospital ambulance sheet did not have a pulse CPR was initiated and she did receive 4 doses of epinephrine. She did regain spontaneous circulation. Her airway was secured with LMA. She subsequently was intubated. Currently on the ventilator unable to provide any review of systems. Pt was extubated on 7/19. ST evaluted on 7/21 and pt has been consuming a Dys 3 diet w/thin liquids.  Oral Cavity - Oral Hygiene     Dysphagia Treatment Treatment Methods:  Skilled observation;Patient/caregiver education Patient observed directly with PO's: Yes Type of PO's observed: Regular;Thin liquids Feeding: Able to feed self Liquids provided via: Straw;Cup Amount of cueing: Independent   GO Functional Assessment Tool Used: clinical judgment Functional Limitations: Swallowing Swallow Current Status (Y6063): At least 1 percent but less than 20 percent impaired, limited or restricted Swallow Goal Status [phone]): At least 1 percent but less than 20 percent impaired, limited or restricted Swallow Discharge Status [phone]): At least 1 percent but less than 20 percent impaired, limited or restricted   Martell 03/02/2017, 10:58 AM

## 2017-03-03 LAB — BASIC METABOLIC PANEL
ANION GAP: 8 (ref 5–15)
BUN: 41 mg/dL — ABNORMAL HIGH (ref 6–20)
CALCIUM: 8.9 mg/dL (ref 8.9–10.3)
CO2: 30 mmol/L (ref 22–32)
Chloride: 101 mmol/L (ref 101–111)
Creatinine, Ser: 1.59 mg/dL — ABNORMAL HIGH (ref 0.44–1.00)
GFR, EST AFRICAN AMERICAN: 39 mL/min — AB (ref >60)
GFR, EST NON AFRICAN AMERICAN: 34 mL/min — AB (ref >60)
GLUCOSE: 195 mg/dL — AB (ref 65–99)
POTASSIUM: 3.9 mmol/L (ref 3.5–5.1)
SODIUM: 139 mmol/L (ref 135–145)

## 2017-03-03 LAB — GLUCOSE, CAPILLARY
GLUCOSE-CAPILLARY: 161 mg/dL — AB (ref 65–99)
Glucose-Capillary: 243 mg/dL — ABNORMAL HIGH (ref 65–99)

## 2017-03-03 LAB — T4, FREE: Free T4: 0.57 ng/dL — ABNORMAL LOW (ref 0.61–1.12)

## 2017-03-03 LAB — PROTIME-INR
INR: 1.21
PROTHROMBIN TIME: 15.4 s — AB (ref 11.4–15.2)

## 2017-03-03 LAB — TSH: TSH: 13.133 u[IU]/mL — ABNORMAL HIGH (ref 0.350–4.500)

## 2017-03-03 MED ORDER — INSULIN GLARGINE 100 UNIT/ML ~~LOC~~ SOLN: 18.0000 [IU] | Freq: Every day | SUBCUTANEOUS | 11 refills | Status: DC

## 2017-03-03 MED ORDER — WARFARIN SODIUM 2.5 MG PO TABS
12.5000 mg | ORAL_TABLET | Freq: Once | ORAL | Status: DC
  Filled 2017-03-03: qty 1

## 2017-03-03 MED ORDER — MECLIZINE HCL 25 MG PO TABS
25.0000 mg | ORAL_TABLET | Freq: Once | ORAL | Status: AC
  Administered 2017-03-03: 25 mg via ORAL
  Filled 2017-03-03: qty 1

## 2017-03-03 MED ORDER — SIMETHICONE 80 MG PO CHEW: 80.0000 mg | CHEWABLE_TABLET | Freq: Four times a day (QID) | ORAL | 0 refills | Status: DC | PRN

## 2017-03-03 MED ORDER — MECLIZINE HCL 12.5 MG PO TABS: 12.5000 mg | ORAL_TABLET | Freq: Three times a day (TID) | ORAL | 0 refills | Status: DC | PRN

## 2017-03-03 MED ORDER — METOPROLOL TARTRATE 25 MG PO TABS: 25.0000 mg | ORAL_TABLET | Freq: Two times a day (BID) | ORAL | 2 refills | Status: DC

## 2017-03-03 MED ORDER — LIDOCAINE 5 % EX PTCH: 1.0000 | MEDICATED_PATCH | CUTANEOUS | 0 refills | Status: DC

## 2017-03-03 MED ORDER — AMIODARONE HCL 400 MG PO TABS: 200.0000 mg | ORAL_TABLET | Freq: Every day | ORAL | 2 refills | Status: DC

## 2017-03-03 MED ORDER — OXYCODONE HCL 5 MG PO TABS: 5.0000 mg | ORAL_TABLET | ORAL | 0 refills | Status: DC | PRN

## 2017-03-03 MED ORDER — FUROSEMIDE 40 MG PO TABS: 40.0000 mg | ORAL_TABLET | Freq: Every day | ORAL | 1 refills | Status: DC

## 2017-03-03 NOTE — Clinical Social Work Placement (Signed)
   CLINICAL SOCIAL WORK PLACEMENT  NOTE  Date:  03/03/2017  Patient Details  Name: Andrea Rivers MRN: [id number] Date of Birth: 1955-09-06  Clinical Social Work is seeking post-discharge placement for this patient at the [place] level of care (*CSW will initial, date and re-position this form in  chart as items are completed):  Yes   Patient/family provided with [place] Work Department's list of facilities offering this level of care within the geographic area requested by the patient (or if unable, by the patient's family).  Yes   Patient/family informed of their freedom to choose among providers that offer the needed level of care, that participate in Medicare, Medicaid or managed care program needed by the patient, have an available bed and are willing to accept the patient.  Yes   Patient/family informed of [place]'s ownership interest in [facility] and [facility], as well as of the fact that they are under no obligation to receive care at these facilities.  PASRR submitted to EDS on 02/28/17     PASRR number received on 02/28/17     Existing PASRR number confirmed on       FL2 transmitted to all facilities in geographic area requested by pt/family on 02/28/17     FL2 transmitted to all facilities within larger geographic area on       Patient informed that his/her managed care company has contracts with or will negotiate with certain facilities, including the following:        Yes   Patient/family informed of bed offers received.  Patient chooses bed at [facility]     Physician recommends and patient chooses bed at      Patient to be transferred to [facility] [place] on 03/03/17.  Patient to be transferred to facility by [facility]     Patient family notified on 03/03/17 of transfer.  Name of family member notified:  Patient's husband was at bedside.     PHYSICIAN Please sign FL2      Additional Comment:    _______________________________________________ Ross Ludwig, LCSWA 03/03/2017, 12:04 PM

## 2017-03-03 NOTE — Plan of Care (Signed)
Problem: Activity: Goal: Risk for activity intolerance will decrease Outcome: Progressing Pt encouraged to get out of bed to chair, ambulate to bathroom and to remain active in room with assistance.

## 2017-03-03 NOTE — Progress Notes (Signed)
SATURATION QUALIFICATIONS: (This note is used to comply with regulatory documentation for home oxygen) ? ?Patient Saturations on Room Air at Rest = 90% ? ?Patient Saturations on Room Air while Ambulating = 84% ? ?Patient Saturations on 2 Liters of oxygen while Ambulating = 93% ? ?Please briefly explain why patient needs home oxygen: ?

## 2017-03-03 NOTE — Progress Notes (Signed)
Pt being discharged to [facility]. IV discontinued without incident, telemetry box returned to clerk. Report called to Maudie Mercury at the facility, EMS called at [phone].

## 2017-03-03 NOTE — Progress Notes (Signed)
ANTICOAGULATION CONSULT NOTE - Follow Up Consult  Pharmacy Consult for warfarin dosing Indication: atrial fibrillation  Allergies  Allergen Reactions  . Other Anaphylaxis    ILOSONE,  Caused GI distress.    Patient Measurements: Height: _0  (167.6 cm) Weight: (!) 322 lb (146.1 kg) IBW/kg (Calculated) : 59.3  Vital Signs: Temp: 98.2 F (36.8 C) (07/25 1945) Temp Source: Oral (07/25 1945) BP: 125/47 (07/25 1945) Pulse Rate: 57 (07/25 1945)  Labs:  Recent Labs  03/01/17 0506 03/02/17 0358 03/03/17 0404  HGB  --  8.6*  --   HCT  --  28.8*  --   PLT  --  286  --   LABPROT 14.9 14.2 15.4*  INR 1.16 1.10 1.21  CREATININE 1.53*  --  1.59*    Estimated Creatinine Clearance: 55.1 mL/min (A) (by C-G formula based on SCr of 1.59 mg/dL (H)).   Medications:  Scheduled:  . amiodarone  400 mg Oral Daily  . chlorhexidine gluconate (MEDLINE KIT)  15 mL Mouth Rinse BID  . citalopram  20 mg Oral Daily  . furosemide  40 mg Intravenous Daily  . insulin aspart  0-5 Units Subcutaneous QHS  . insulin aspart  0-9 Units Subcutaneous TID WC  . insulin glargine  25 Units Subcutaneous QHS  . lidocaine  1 patch Transdermal Q24H  . loratadine  10 mg Oral Daily  . mouth rinse  15 mL Mouth Rinse BID  . metoprolol tartrate  25 mg Oral BID  . simethicone  80 mg Oral QID  . sodium chloride flush  3 mL Intravenous Q12H  . Warfarin - Pharmacist Dosing Inpatient   Does not apply q1800   Infusions:   PRN: acetaminophen **OR** [DISCONTINUED] acetaminophen, [DISCONTINUED] ondansetron **OR** ondansetron (ZOFRAN) IV, oxyCODONE, simethicone, sodium chloride flush  Assessment: 61 y/o female admitted with acute respiratory failure, pt INR supratherapeutic prior to admission, home dose 7.38m daily, but on fluconazole per [facility] pharmacy records. Patient is s/p vitamin k 156msubq 7/19. Patient is on amiodarone this admission.    Goal of Therapy:  INR 2-3 Monitor platelets by anticoagulation  protocol: Yes   Plan:  INR remaining stable and subtherapeutic, will order warfarin 12.71m53m 1 tonight. Will monitor INR and CBC moving forward AM labs. Daily INR.   Andrea Rivers 03/03/2017,7:11 AM

## 2017-03-03 NOTE — Progress Notes (Signed)
Results for ANISE, HARBIN (MRN [id number]) as of 03/03/2017 13:13  Ref. Range 03/02/2017 11:43 03/02/2017 16:54 03/02/2017 20:47 03/03/2017 07:40 03/03/2017 11:11  Glucose-Capillary Latest Ref Range: 65 - 99 mg/dL 202 (H) 211 (H) 257 (H) 161 (H) 243 (H)  Noted that postprandial blood sugars have been elevated. Recommend adding Novolog 3-4 units TID with meals if patient eats at least 50% of meals.   Harvel Ricks RN BSN CDE Diabetes Coordinator Pager: [phone]  8am-5pm

## 2017-03-03 NOTE — Discharge Summary (Signed)
[facility] NAME: Andrea Rivers    MR#:  [id number]  DATE OF BIRTH:  Dec 10, 1955  DATE OF ADMISSION:  02/22/2017   ADMITTING PHYSICIAN: Dustin Flock, MD  DATE OF DISCHARGE: 03/03/2017  PRIMARY CARE PHYSICIAN: Sallee Lange, NP   ADMISSION DIAGNOSIS:   Cardiac arrest ([place]) [I46.9] Respiratory arrest ([place]) [R09.2] SOB (shortness of breath) [R06.02] Hypoxia [R09.02] Unresponsive [R41.89]  DISCHARGE DIAGNOSIS:   Active Problems:   Acute respiratory failure ([place])   Shock ([place])   SECONDARY DIAGNOSIS:   Past Medical History:  Diagnosis Date  . Anemia   . Anxiety   . Chronic kidney disease    INSUFFICIENCY  . COPD (chronic obstructive pulmonary disease) ([place])   . Diabetes mellitus without complication ([place])   . Dyspnea    DOE  . Dysrhythmia    A FIB  . Edema   . GERD (gastroesophageal reflux disease)   . History of kidney stones   . Hypertension   . Hypothyroidism    ABLATION  . Neuropathy   . Orthopnea   . Sleep apnea    CPAP  . Vertigo   . Wheezing     HOSPITAL COURSE:   61 year old obese female with past medical history significant for hypothyroidism, atrial fibrillation, type 2 diabetes mellitus, osteoporosis, GERD, hypertension, chronic low back pain, diastolic CHF and obstructive sleep apnea admitted with PEA arrest.  #1 acute respiratory failure-secondary to diastolic CHF exacerbation. -Acute respiratory failure resulting in the cardiac arrest. -Initially admitted to ICU, intubated and required mechanical ventilation. -Not on home oxygen, currently extubated and weaned down to room air and occasionally needing 1L with exertion. -received IV Lasix and follow up chest x-ray with improvement. Change to oral Lasix at discharge -Appreciate pulmonary critical care consultation  #2 acute renal failure on CK D-baseline creatinine around 1.8. -Nephrology consulted. Monitor closely. Has  cardiorenal syndrome on admission and is now improved. -Continue to check daily weights  #3 A. fib with rapid ventricular response-has known history of paroxysmal A. fib. Triggered by her hypoxia on admission. Converted to sinus rhythm. -Continue oral amiodarone. Also on metoprolol. -On Coumadin for anticoagulation. Check INR in 2-3 days  #4 acute on chronic anemia-stable. Keep hemoglobin greater than 8 with recent cardiorespiratory arrest. No indication for transfusion at this time.  Worked with physical therapy and they have recommended rehabilitation at this time. Stable for discharge today  DISCHARGE CONDITIONS:   Guarded  CONSULTS OBTAINED:   Treatment Team:  Anthonette Legato, MD Isaias Cowman, MD  DRUG ALLERGIES:   Allergies  Allergen Reactions  . Other Anaphylaxis    ILOSONE,  Caused GI distress.   DISCHARGE MEDICATIONS:   Allergies as of 03/03/2017      Reactions   Other Anaphylaxis   ILOSONE,  Caused GI distress.      Medication List    STOP taking these medications   amLODipine-benazepril 5-20 MG capsule Commonly known as:  LOTREL   amoxicillin-clavulanate 875-125 MG tablet Commonly known as:  AUGMENTIN   celecoxib 200 MG capsule Commonly known as:  CELEBREX   fluconazole 150 MG tablet Commonly known as:  DIFLUCAN   insulin lispro 100 UNIT/ML injection Commonly known as:  HUMALOG   metFORMIN 1000 MG tablet Commonly known as:  GLUCOPHAGE   metoprolol succinate 100 MG 24 hr tablet Commonly known as:  TOPROL-XL     TAKE these medications   amiodarone 400 MG tablet Commonly known as:  PACERONE Take 0.5 tablets (200 mg total) by mouth daily.   citalopram 20 MG tablet Commonly known as:  CELEXA Take 20 mg by mouth daily.   Difluprednate 0.05 % Emul Apply 1 drop to eye Nightly.   furosemide 40 MG tablet Commonly known as:  LASIX Take 1 tablet (40 mg total) by mouth daily. What changed:  when to take this   glimepiride 2 MG  tablet Commonly known as:  AMARYL Take 2 mg by mouth daily with breakfast.   insulin glargine 100 UNIT/ML injection Commonly known as:  LANTUS Inject 0.18 mLs (18 Units total) into the skin at bedtime. What changed:  how much to take   levothyroxine 175 MCG tablet Commonly known as:  SYNTHROID, LEVOTHROID Take 175 mcg by mouth daily before breakfast.   lidocaine 5 % Commonly known as:  LIDODERM Place 1 patch onto the skin daily. Remove & Discard patch within 12 hours or as directed by MD   loratadine 10 MG tablet Commonly known as:  CLARITIN Take 10 mg by mouth daily.   lovastatin 20 MG tablet Commonly known as:  MEVACOR Take 20 mg by mouth at bedtime.   magnesium oxide 400 MG tablet Commonly known as:  MAG-OX Take 400 mg by mouth 2 (two) times daily.   meclizine 12.5 MG tablet Commonly known as:  ANTIVERT Take 1 tablet (12.5 mg total) by mouth 3 (three) times daily as needed for dizziness. What changed:  when to take this  reasons to take this   metoprolol tartrate 25 MG tablet Commonly known as:  LOPRESSOR Take 1 tablet (25 mg total) by mouth 2 (two) times daily.   moxifloxacin 0.5 % ophthalmic solution Commonly known as:  VIGAMOX Place 1 drop into the left eye 4 (four) times daily.   nepafenac 0.3 % ophthalmic suspension Commonly known as:  ILEVRO Place 1 drop into the left eye daily.   nystatin 100000 UNIT/ML suspension Commonly known as:  MYCOSTATIN Take 5 mLs (500,000 Units total) by mouth 4 (four) times daily.   oxyCODONE 5 MG immediate release tablet Commonly known as:  Oxy IR/ROXICODONE Take 1 tablet (5 mg total) by mouth every 4 (four) hours as needed for moderate pain.   pantoprazole 40 MG tablet Commonly known as:  PROTONIX Take 40 mg by mouth 2 (two) times daily.   simethicone 80 MG chewable tablet Commonly known as:  MYLICON Chew 1 tablet (80 mg total) by mouth every 6 (six) hours as needed for flatulence.   TRADJENTA 5 MG Tabs  tablet Generic drug:  linagliptin Take 5 mg by mouth daily.   warfarin 7.5 MG tablet Commonly known as:  COUMADIN Take 7.5 mg by mouth daily at 6 PM.   warfarin 1 MG tablet Commonly known as:  COUMADIN Take 0.5 mg by mouth daily at 6 PM.        DISCHARGE INSTRUCTIONS:   1. PCP follow-up in 1-2 weeks 2. INR and BMP check in 2-3 days 3. Nephrology follow-up in 1 week 4. Cardiology follow up in 2 weeks  DIET:   Cardiac diet  ACTIVITY:   Activity as tolerated  OXYGEN:   Home Oxygen: No.  Oxygen Delivery: room air  DISCHARGE LOCATION:   nursing home   If you experience worsening of your admission symptoms, develop shortness of breath, life threatening emergency, suicidal or homicidal thoughts you must seek medical attention immediately by calling 911 or calling your MD immediately  if symptoms less severe.  You Must  read complete instructions/literature along with all the possible adverse reactions/side effects for all the Medicines you take and that have been prescribed to you. Take any new Medicines after you have completely understood and accpet all the possible adverse reactions/side effects.   Please note  You were cared for by a hospitalist during your hospital stay. If you have any questions about your discharge medications or the care you received while you were in the hospital after you are discharged, you can call the unit and asked to speak with the hospitalist on call if the hospitalist that took care of you is not available. Once you are discharged, your primary care physician will handle any further medical issues. Please note that NO REFILLS for any discharge medications will be authorized once you are discharged, as it is imperative that you return to your primary care physician (or establish a relationship with a primary care physician if you do not have one) for your aftercare needs so that they can reassess your need for medications and monitor your lab  values.    On the day of Discharge:  VITAL SIGNS:   Blood pressure (!) 125/47, pulse (!) 57, temperature 98.2 F (36.8 C), temperature source Oral, resp. rate 19, height 5\' 6"  (1.676 m), weight (!) 146.1 kg (322 lb), SpO2 97 %.  PHYSICAL EXAMINATION:    GENERAL:  61 y.o.-year-old Obese patient lying in the bed with no acute distress.  EYES: Pupils equal, round, reactive to light and accommodation. No scleral icterus. Extraocular muscles intact.  HEENT: Head atraumatic, normocephalic. Oropharynx and nasopharynx clear.  NECK:  Supple, no jugular venous distention. No thyroid enlargement, no tenderness.  LUNGS: Normal breath sounds bilaterally, no wheezing, rales,rhonchi or crepitation. No use of accessory muscles of respiration. Decreased bibasilar breath sounds at the bases CARDIOVASCULAR: S1, S2 normal. No murmurs, rubs, or gallops.  ABDOMEN: Soft, nontender, nondistended. Bowel sounds present. No organomegaly or mass.  EXTREMITIES: No cyanosis, or clubbing. 1+ bilateral leg edema, right greater than left NEUROLOGIC: Cranial nerves II through XII are intact. Muscle strength 5/5 in all extremities. Sensation intact. Gait not checked.  PSYCHIATRIC: The patient is alert and oriented x 3.  SKIN: No obvious rash, lesion, or ulcer.   DATA REVIEW:   CBC  Recent Labs Lab 03/02/17 0358  WBC 13.1*  HGB 8.6*  HCT 28.8*  PLT 286    Chemistries   Recent Labs Lab 02/25/17 0501 02/26/17 0827  03/03/17 0404  NA 141 141  < > 139  K 4.1 4.6  < > 3.9  CL 100* 100*  < > 101  CO2 30 32  < > 30  GLUCOSE 175* 185*  < > 195*  BUN 53* 41*  < > 41*  CREATININE 1.96* 1.57*  < > 1.59*  CALCIUM 8.6* 9.1  < > 8.9  MG  --  2.2  --   --   AST 23  --   --   --   ALT 21  --   --   --   ALKPHOS 70  --   --   --   BILITOT 1.2  --   --   --   < > = values in this interval not displayed.   Microbiology Results  Results for orders placed or performed during the hospital encounter of 02/22/17   Blood Culture (routine x 2)     Status: None   Collection Time: 02/22/17  7:55 AM  Result Value Ref Range  Status   Specimen Description BLOOD RIGHT AV  Final   Special Requests   Final    BOTTLES DRAWN AEROBIC AND ANAEROBIC Blood Culture results may not be optimal due to an excessive volume of blood received in culture bottles   Culture NO GROWTH 5 DAYS  Final   Report Status 02/27/2017 FINAL  Final  Blood Culture (routine x 2)     Status: None   Collection Time: 02/22/17  8:15 AM  Result Value Ref Range Status   Specimen Description BLOOD RIGHT HAND  Final   Special Requests   Final    BOTTLES DRAWN AEROBIC AND ANAEROBIC Blood Culture results may not be optimal due to an excessive volume of blood received in culture bottles   Culture NO GROWTH 5 DAYS  Final   Report Status 02/27/2017 FINAL  Final  Culture, respiratory (NON-Expectorated)     Status: None   Collection Time: 02/22/17 10:00 AM  Result Value Ref Range Status   Specimen Description TRACHEAL ASPIRATE  Final   Special Requests Normal  Final   Gram Stain   Final    FEW WBC PRESENT,BOTH PMN AND MONONUCLEAR NO ORGANISMS SEEN    Culture   Final    Consistent with normal respiratory flora. Performed at [facility], [place] [address], [place], [place] [address]    Report Status 02/25/2017 FINAL  Final  MRSA PCR Screening     Status: None   Collection Time: 02/22/17 10:50 AM  Result Value Ref Range Status   MRSA by PCR NEGATIVE NEGATIVE Final    Comment:        The GeneXpert MRSA Assay (FDA approved for NASAL specimens only), is one component of a comprehensive MRSA colonization surveillance program. It is not intended to diagnose MRSA infection nor to guide or monitor treatment for MRSA infections.   Urine culture     Status: None   Collection Time: 02/22/17 11:26 AM  Result Value Ref Range Status   Specimen Description URINE, CATHETERIZED  Final   Special Requests NONE  Final   Culture   Final    NO  GROWTH Performed at [facility], [address], [place], [place] [address]    Report Status 02/24/2017 FINAL  Final    RADIOLOGY:  Dg Chest 2 View  Result Date: 03/02/2017 CLINICAL DATA:  Patient recently underwent CPR. History of COPD, diabetes, chronic renal insufficiency. Former smoker. EXAM: CHEST  2 VIEW COMPARISON:  Portable chest x-ray of February 27, 2017 FINDINGS: The lungs are adequately inflated. The interstitial markings are coarse at both bases but have improved since the study of 3 days ago. Small amounts of pleural fluid blunt the posterior costophrenic angles. The cardiac silhouette remains enlarged. The pulmonary vascularity is less engorged. The trachea is midline. The bony thorax exhibits no acute abnormality. IMPRESSION: Improved appearance of the chest over the study of 3 days ago. Persistent bibasilar atelectasis or pneumonia. Interval marked improvement in pulmonary edema. Electronically Signed   By: David  Martinique M.D.   On: 03/02/2017 16:02     Management plans discussed with the patient, family and they are in agreement.  CODE STATUS:     Code Status Orders        Start     Ordered   02/22/17 1004  Full code  Continuous     02/22/17 1003    Code Status History    Date Active Date Inactive Code Status Order ID Comments User  Context   01/26/2017  4:36 PM 01/28/2017 12:34 PM Full Code [id number]  Baxter Hire, MD Inpatient      TOTAL TIME TAKING CARE OF THIS PATIENT: 37 minutes.    Densil Ottey M.D on 03/03/2017 at 11:17 AM  Between 7am to 6pm - Pager - [phone]  After 6pm go to [email or web address] - Proofreader  [facility]  Office  [phone]  CC: Primary care physician; Sallee Lange, NP   Note: This dictation was prepared with Dragon dictation along with smaller phrase technology. Any transcriptional errors that result from this process are unintentional.

## 2017-03-10 ENCOUNTER — Encounter: Payer: Self-pay | Admitting: Family

## 2017-03-10 ENCOUNTER — Ambulatory Visit: Payer: Medicare Other | Attending: Family | Admitting: Family

## 2017-03-10 DIAGNOSIS — E039 Hypothyroidism, unspecified: Secondary | ICD-10-CM | POA: Insufficient documentation

## 2017-03-10 DIAGNOSIS — K219 Gastro-esophageal reflux disease without esophagitis: Secondary | ICD-10-CM | POA: Insufficient documentation

## 2017-03-10 DIAGNOSIS — I4891 Unspecified atrial fibrillation: Secondary | ICD-10-CM | POA: Insufficient documentation

## 2017-03-10 DIAGNOSIS — Z87891 Personal history of nicotine dependence: Secondary | ICD-10-CM | POA: Insufficient documentation

## 2017-03-10 DIAGNOSIS — G4733 Obstructive sleep apnea (adult) (pediatric): Secondary | ICD-10-CM | POA: Diagnosis not present

## 2017-03-10 DIAGNOSIS — F419 Anxiety disorder, unspecified: Secondary | ICD-10-CM | POA: Diagnosis not present

## 2017-03-10 DIAGNOSIS — J449 Chronic obstructive pulmonary disease, unspecified: Secondary | ICD-10-CM | POA: Diagnosis not present

## 2017-03-10 DIAGNOSIS — Z9049 Acquired absence of other specified parts of digestive tract: Secondary | ICD-10-CM | POA: Diagnosis not present

## 2017-03-10 DIAGNOSIS — N183 Chronic kidney disease, stage 3 unspecified: Secondary | ICD-10-CM

## 2017-03-10 DIAGNOSIS — Z794 Long term (current) use of insulin: Secondary | ICD-10-CM | POA: Diagnosis not present

## 2017-03-10 DIAGNOSIS — I5032 Chronic diastolic (congestive) heart failure: Secondary | ICD-10-CM

## 2017-03-10 DIAGNOSIS — J9600 Acute respiratory failure, unspecified whether with hypoxia or hypercapnia: Secondary | ICD-10-CM | POA: Insufficient documentation

## 2017-03-10 DIAGNOSIS — Z888 Allergy status to other drugs, medicaments and biological substances status: Secondary | ICD-10-CM | POA: Diagnosis not present

## 2017-03-10 DIAGNOSIS — D631 Anemia in chronic kidney disease: Secondary | ICD-10-CM | POA: Insufficient documentation

## 2017-03-10 DIAGNOSIS — Z9889 Other specified postprocedural states: Secondary | ICD-10-CM | POA: Diagnosis not present

## 2017-03-10 DIAGNOSIS — E119 Type 2 diabetes mellitus without complications: Secondary | ICD-10-CM | POA: Insufficient documentation

## 2017-03-10 DIAGNOSIS — E1140 Type 2 diabetes mellitus with diabetic neuropathy, unspecified: Secondary | ICD-10-CM | POA: Insufficient documentation

## 2017-03-10 DIAGNOSIS — Z8674 Personal history of sudden cardiac arrest: Secondary | ICD-10-CM | POA: Diagnosis not present

## 2017-03-10 DIAGNOSIS — Z7902 Long term (current) use of antithrombotics/antiplatelets: Secondary | ICD-10-CM | POA: Insufficient documentation

## 2017-03-10 DIAGNOSIS — I130 Hypertensive heart and chronic kidney disease with heart failure and stage 1 through stage 4 chronic kidney disease, or unspecified chronic kidney disease: Secondary | ICD-10-CM | POA: Diagnosis not present

## 2017-03-10 DIAGNOSIS — E1122 Type 2 diabetes mellitus with diabetic chronic kidney disease: Secondary | ICD-10-CM | POA: Diagnosis not present

## 2017-03-10 DIAGNOSIS — N189 Chronic kidney disease, unspecified: Secondary | ICD-10-CM | POA: Insufficient documentation

## 2017-03-10 DIAGNOSIS — Z9842 Cataract extraction status, left eye: Secondary | ICD-10-CM | POA: Insufficient documentation

## 2017-03-10 DIAGNOSIS — Z87442 Personal history of urinary calculi: Secondary | ICD-10-CM | POA: Insufficient documentation

## 2017-03-10 DIAGNOSIS — I10 Essential (primary) hypertension: Secondary | ICD-10-CM | POA: Insufficient documentation

## 2017-03-10 NOTE — Patient Instructions (Signed)
Continue weighing daily and call for an overnight weight gain of > 2 pounds or a weekly weight gain of >5 pounds. 

## 2017-03-10 NOTE — Progress Notes (Signed)
Patient ID: Andrea Rivers, female    DOB: July 01, 1956, 60 y.o.   MRN: [id number]  HPI  Andrea Rivers is a 61 y/o female with a history of anemia, anxiety, CKD, COPD, DM, GERD, HTN, hypothyroidism, neuropathy, obstructive sleep apnea (+CPAP), previous tobacco use and chronic heart failure.  Echo done 01/26/17 shows an EF of 50-55%.   Admitted 02/22/17 due to HF exacerbation which led to cardiac arrest. Initially was intubated and received IV lasix. Pulmonology, cardiology and nephrology were consulted. Discharged to rehab after 9 days. Admitted 01/26/17 due to acute respiratory failure, etiology unclear. Pulmonology consult obtained and 5 days of augmentin was given at discharge. Discharged after 2 days.   She presents today for her initial visit with a chief complaint of swelling in her lower legs. She describes this as chronic in nature having been present for several years with waxing/waning of severity. She does feel like it's been worsening some since hospital discharge. She has associated fatigue, shortness of breath and light-headedness along with this. Has been weighing herself daily while at [facility].   Past Medical History:  Diagnosis Date  . Anemia   . Anxiety   . Chronic kidney disease    INSUFFICIENCY  . COPD (chronic obstructive pulmonary disease) ([place])   . Diabetes mellitus without complication ([place])   . Dyspnea    DOE  . Dysrhythmia    A FIB  . Edema   . GERD (gastroesophageal reflux disease)   . History of kidney stones   . Hypertension   . Hypothyroidism    ABLATION  . Neuropathy   . Orthopnea   . Sleep apnea    CPAP  . Vertigo   . Wheezing    Past Surgical History:  Procedure Laterality Date  . CATARACT EXTRACTION W/PHACO Left 01/26/2017   Procedure: CATARACT EXTRACTION PHACO AND INTRAOCULAR LENS PLACEMENT (IOC);  Surgeon: Estill Cotta, MD;  Location: [facility] ORS;  Service: Ophthalmology;  Laterality: Left;  [place]   1:02.2 AP     23.7 CDE   28.92 fluid  pack lot# [id number] H exp.05/08/2018  . CHOLECYSTECTOMY    . TONSILLECTOMY     No family history on file. Social History  Substance Use Topics  . Smoking status: Former Research scientist (life sciences)  . Smokeless tobacco: Former Systems developer  . Alcohol use No   Allergies  Allergen Reactions  . Other Anaphylaxis    ILOSONE,  Caused GI distress.   Prior to Admission medications   Medication Sig Start Date End Date Taking? Authorizing Provider  amiodarone (PACERONE) 400 MG tablet Take 0.5 tablets (200 mg total) by mouth daily. 03/03/17  Yes Gladstone Lighter, MD  Calcium Carbonate-Vitamin D3 (CALCIUM 600-D) 600-400 MG-UNIT TABS Take 1 tablet by mouth daily.   Yes [provider]  Cholecalciferol (D3 HIGH POTENCY) 2000 units CAPS Take 1 capsule by mouth daily.   Yes [provider]  citalopram (CELEXA) 20 MG tablet Take 20 mg by mouth daily.   Yes [provider]  co-enzyme Q-10 50 MG capsule Take 50 mg by mouth daily.   Yes [provider]  furosemide (LASIX) 40 MG tablet Take 1 tablet (40 mg total) by mouth daily. 03/03/17  Yes Gladstone Lighter, MD  Garlic 4081 MG CAPS Take 1 capsule by mouth daily.   Yes [provider]  glimepiride (AMARYL) 2 MG tablet Take 2 mg by mouth daily with breakfast.   Yes [provider]  insulin glargine (LANTUS) 100 UNIT/ML injection Inject 0.18 mLs (  18 Units total) into the skin at bedtime. 03/03/17  Yes Gladstone Lighter, MD  insulin regular (NOVOLIN R) 100 units/mL injection Inject 2 Units into the skin 3 (three) times daily before meals.   Yes [provider]  levothyroxine (SYNTHROID, LEVOTHROID) 175 MCG tablet Take 175 mcg by mouth daily before breakfast.   Yes [provider]  lidocaine (LIDODERM) 5 % Place 1 patch onto the skin daily. Remove & Discard patch within 12 hours or as directed by MD 03/03/17  Yes Gladstone Lighter, MD  linagliptin (TRADJENTA) 5 MG TABS tablet Take 5 mg by mouth daily.   Yes [provider]  loratadine (CLARITIN) 10 MG tablet Take 10 mg by mouth daily.   Yes [provider]  lovastatin (MEVACOR) 20 MG tablet Take 20 mg by mouth at bedtime.   Yes [provider]  magnesium oxide (MAG-OX) 400 MG tablet Take 400 mg by mouth 2 (two) times daily.   Yes [provider]  meclizine (ANTIVERT) 12.5 MG tablet Take 1 tablet (12.5 mg total) by mouth 3 (three) times daily as needed for dizziness. 03/03/17  Yes Gladstone Lighter, MD  metoprolol tartrate (LOPRESSOR) 25 MG tablet Take 1 tablet (25 mg total) by mouth 2 (two) times daily. 03/03/17  Yes Gladstone Lighter, MD  Omega-3 Fatty Acids (FISH OIL) 1000 MG CPDR Take 1 capsule by mouth daily.   Yes [provider]  oxyCODONE (OXY IR/ROXICODONE) 5 MG immediate release tablet Take 1 tablet (5 mg total) by mouth every 4 (four) hours as needed for moderate pain. 03/03/17  Yes Gladstone Lighter, MD  pantoprazole (PROTONIX) 40 MG tablet Take 40 mg by mouth 2 (two) times daily.   Yes [provider]  vitamin C (ASCORBIC ACID) 500 MG tablet Take 500 mg by mouth daily.   Yes [provider]  warfarin (COUMADIN) 1 MG tablet Take 0.5 mg by mouth daily at 6 PM.   Yes [provider]  warfarin (COUMADIN) 7.5 MG tablet Take 7.5 mg by mouth daily at 6 PM.   Yes [provider]    Review of Systems  Constitutional: Positive for fatigue. Negative for appetite change.  HENT: Negative for congestion, postnasal drip and sore throat.   Eyes: Positive for visual disturbance (blurry vision in left eye). Negative for pain.  Respiratory: Positive for shortness of breath. Negative for cough and chest tightness.   Cardiovascular: Positive for leg swelling. Negative for chest pain and palpitations.  Gastrointestinal: Negative for abdominal distention and abdominal pain.  Endocrine: Negative.   Genitourinary: Negative.   Musculoskeletal: Positive for arthralgias (right shoulder), back  pain and joint swelling (hematoma right upper arm and back of left upper arm).       Pain due to broken ribs   Skin: Negative.   Allergic/Immunologic: Negative.   Neurological: Positive for light-headedness (due to vertigo). Negative for dizziness.  Hematological: Negative for adenopathy. Bruises/bleeds easily.  Psychiatric/Behavioral: Negative for dysphoric mood and sleep disturbance (sleeping on 6 pillows due to comfort). The patient is not nervous/anxious.    Vitals:   03/10/17 0848  BP: 120/72  Pulse: 61  Resp: 20  SpO2: 98%  Weight: 296 lb 4 oz (134.4 kg)  Height: 5\' 6"  (1.676 m)   Wt Readings from Last 3 Encounters:  03/10/17 296 lb 4 oz (134.4 kg)  02/22/17 (!) 322 lb (146.1 kg)  01/26/17 299 lb 6.2 oz (135.8 kg)   Lab Results  Component Value Date   CREATININE  1.59 (H) 03/03/2017   CREATININE 1.53 (H) 03/01/2017   CREATININE 1.56 (H) 02/28/2017   Physical Exam  Constitutional: She is oriented to person, place, and time. She appears well-developed and well-nourished.  HENT:  Head: Normocephalic and atraumatic.  Neck: Normal range of motion. Neck supple. No JVD present.  Cardiovascular: Normal rate and regular rhythm.   Pulmonary/Chest: Effort normal. She has no wheezes. She has no rales.  Abdominal: Soft. She exhibits no distension. There is no tenderness.  Musculoskeletal: She exhibits edema (1+ pitting edema in bilateral lower legs). She exhibits no tenderness.       Left upper arm: She exhibits edema (firm knot back of both upper arms).  Neurological: She is alert and oriented to person, place, and time.  Skin: Skin is warm and dry. Bruising (bilateral upper arms) noted.  Psychiatric: She has a normal mood and affect. Her behavior is normal. Thought content normal.  Nursing note and vitals reviewed.   Assessment & Plan:  1: Chronic heart failure with preserved ejection fraction- - NYHA class II - mildly fluid overloaded today - weighing daily at the  facility. Instructed to continue this once she returns home and to call for an overnight weight gain of >2 pounds or a weekly weight gain of >5 pounds - not adding salt to her food and is currently on a sodium free diet. Reports that she reads food labels at home and tries to keep intake to 2000mg  sodium daily. - feels like she doesn't respond to the furosemide like she used to and says that she had been on 80mg  daily at home for quite some time - will stop her furosemide and begin torsemide 40mg  AM - [facility] to check a BMP in 4 days - saw cardiologist (Gutierrez) 02/08/17 and returns in 6 months - Pharm D went in and reviewed medications with the patient - does not meet ReDS vest criteria due to BMI  2: HTN- - BP looks good today - BMP from 03/03/17 reviewed and shows sodium 139, potassium 3.9 and GFR 34 - saw PCP (Benson) 02/07/17 and returns in September 2018  3: Diabetes- - glucose this morning was 141 - A1c from 11/12/16 was 6.7% - saw endocrinologist Eddie Dibbles) 09/02/16  4: Obstructive sleep apnea- - had been wearing CPAP nightly up until most recent hospitalization which ended with bacterial infection and CPR - she feels like the bacteria originated from her CPAP equipment and would like to get it replaced - on a prescription was written to have her CPAP equipment replaced so that she can start using it again  Facility medication list was reviewed.  Return here in 1 month or sooner for any questions/problems before then.

## 2017-03-31 ENCOUNTER — Encounter: Payer: Self-pay | Admitting: Oncology

## 2017-03-31 ENCOUNTER — Inpatient Hospital Stay: Payer: Medicare Other | Attending: Oncology | Admitting: Oncology

## 2017-03-31 ENCOUNTER — Inpatient Hospital Stay: Payer: Medicare Other

## 2017-03-31 VITALS — BP 135/75 | HR 53 | Temp 97.9°F | Resp 18 | Ht 65.0 in | Wt 299.5 lb

## 2017-03-31 DIAGNOSIS — E038 Other specified hypothyroidism: Secondary | ICD-10-CM | POA: Diagnosis not present

## 2017-03-31 DIAGNOSIS — Z87442 Personal history of urinary calculi: Secondary | ICD-10-CM | POA: Insufficient documentation

## 2017-03-31 DIAGNOSIS — I130 Hypertensive heart and chronic kidney disease with heart failure and stage 1 through stage 4 chronic kidney disease, or unspecified chronic kidney disease: Secondary | ICD-10-CM | POA: Diagnosis not present

## 2017-03-31 DIAGNOSIS — J449 Chronic obstructive pulmonary disease, unspecified: Secondary | ICD-10-CM | POA: Diagnosis not present

## 2017-03-31 DIAGNOSIS — N183 Chronic kidney disease, stage 3 unspecified: Secondary | ICD-10-CM

## 2017-03-31 DIAGNOSIS — Z87891 Personal history of nicotine dependence: Secondary | ICD-10-CM | POA: Diagnosis not present

## 2017-03-31 DIAGNOSIS — Z79899 Other long term (current) drug therapy: Secondary | ICD-10-CM

## 2017-03-31 DIAGNOSIS — F419 Anxiety disorder, unspecified: Secondary | ICD-10-CM | POA: Diagnosis not present

## 2017-03-31 DIAGNOSIS — D509 Iron deficiency anemia, unspecified: Secondary | ICD-10-CM

## 2017-03-31 DIAGNOSIS — Z794 Long term (current) use of insulin: Secondary | ICD-10-CM | POA: Insufficient documentation

## 2017-03-31 DIAGNOSIS — G4730 Sleep apnea, unspecified: Secondary | ICD-10-CM | POA: Diagnosis not present

## 2017-03-31 DIAGNOSIS — E039 Hypothyroidism, unspecified: Secondary | ICD-10-CM

## 2017-03-31 DIAGNOSIS — I4891 Unspecified atrial fibrillation: Secondary | ICD-10-CM | POA: Insufficient documentation

## 2017-03-31 DIAGNOSIS — J9600 Acute respiratory failure, unspecified whether with hypoxia or hypercapnia: Secondary | ICD-10-CM | POA: Diagnosis not present

## 2017-03-31 DIAGNOSIS — E1122 Type 2 diabetes mellitus with diabetic chronic kidney disease: Secondary | ICD-10-CM | POA: Insufficient documentation

## 2017-03-31 DIAGNOSIS — K219 Gastro-esophageal reflux disease without esophagitis: Secondary | ICD-10-CM | POA: Diagnosis not present

## 2017-03-31 DIAGNOSIS — Z7901 Long term (current) use of anticoagulants: Secondary | ICD-10-CM | POA: Diagnosis not present

## 2017-03-31 DIAGNOSIS — I5032 Chronic diastolic (congestive) heart failure: Secondary | ICD-10-CM | POA: Diagnosis not present

## 2017-03-31 HISTORY — DX: Iron deficiency anemia, unspecified: D50.9

## 2017-03-31 LAB — COMPREHENSIVE METABOLIC PANEL
ALBUMIN: 3.6 g/dL (ref 3.5–5.0)
ALK PHOS: 100 U/L (ref 38–126)
ALT: 10 U/L — AB (ref 14–54)
ANION GAP: 7 (ref 5–15)
AST: 17 U/L (ref 15–41)
BUN: 27 mg/dL — ABNORMAL HIGH (ref 6–20)
CALCIUM: 8.9 mg/dL (ref 8.9–10.3)
CO2: 33 mmol/L — AB (ref 22–32)
Chloride: 99 mmol/L — ABNORMAL LOW (ref 101–111)
Creatinine, Ser: 1.5 mg/dL — ABNORMAL HIGH (ref 0.44–1.00)
GFR calc Af Amer: 42 mL/min — ABNORMAL LOW (ref >60)
GFR calc non Af Amer: 36 mL/min — ABNORMAL LOW (ref >60)
GLUCOSE: 132 mg/dL — AB (ref 65–99)
Potassium: 4.3 mmol/L (ref 3.5–5.1)
SODIUM: 139 mmol/L (ref 135–145)
Total Bilirubin: 0.7 mg/dL (ref 0.3–1.2)
Total Protein: 7.8 g/dL (ref 6.5–8.1)

## 2017-03-31 LAB — FOLATE: FOLATE: 12.7 ng/mL (ref >5.9)

## 2017-03-31 LAB — CBC WITH DIFFERENTIAL/PLATELET
BASOS ABS: 0.1 10*3/uL (ref 0–0.1)
BASOS PCT: 1 %
EOS ABS: 0.3 10*3/uL (ref 0–0.7)
Eosinophils Relative: 3 %
HCT: 29 % — ABNORMAL LOW (ref 35.0–47.0)
HEMOGLOBIN: 8.7 g/dL — AB (ref 12.0–16.0)
Lymphocytes Relative: 13 %
Lymphs Abs: 1.5 10*3/uL (ref 1.0–3.6)
MCH: 20.9 pg — ABNORMAL LOW (ref 26.0–34.0)
MCHC: 30.1 g/dL — AB (ref 32.0–36.0)
MCV: 69.5 fL — ABNORMAL LOW (ref 80.0–100.0)
Monocytes Absolute: 1 10*3/uL — ABNORMAL HIGH (ref 0.2–0.9)
Monocytes Relative: 8 %
NEUTROS PCT: 75 %
Neutro Abs: 9 10*3/uL — ABNORMAL HIGH (ref 1.4–6.5)
Platelets: 256 10*3/uL (ref 150–440)
RBC: 4.17 MIL/uL (ref 3.80–5.20)
RDW: 20.5 % — ABNORMAL HIGH (ref 11.5–14.5)
WBC: 11.8 10*3/uL — AB (ref 3.6–11.0)

## 2017-03-31 LAB — IRON AND TIBC
Iron: 21 ug/dL — ABNORMAL LOW (ref 28–170)
SATURATION RATIOS: 6 % — AB (ref 10.4–31.8)
TIBC: 364 ug/dL (ref 250–450)
UIBC: 343 ug/dL

## 2017-03-31 LAB — RETICULOCYTES
RBC.: 4.18 MIL/uL (ref 3.80–5.20)
RETIC CT PCT: 1.6 % (ref 0.4–3.1)
Retic Count, Absolute: 66.9 10*3/uL (ref 19.0–183.0)

## 2017-03-31 LAB — FERRITIN: Ferritin: 21 ng/mL (ref 11–307)

## 2017-03-31 NOTE — Progress Notes (Signed)
Hematology/Oncology Consult note [facility] Telephone:(336) [phone] Fax:(336) [phone]  CONSULT NOTE Patient Care Team: Sofie Hartigan, MD as PCP - General (Family Medicine) Alisa Graff, FNP as Nurse Practitioner (Family Medicine) Ubaldo Glassing Javier Docker, MD as Consulting Physician (Cardiology) Elease Etienne, MD as Consulting Physician (Endocrinology)  CHIEF COMPLAINTS/PURPOSE OF CONSULTATION:  Anemia    HISTORY OF PRESENTING ILLNESS:  Andrea Rivers 61 y.o.  female with past medical history listed as below who was referred by Dr. Holley Raring to me for evaluation and management of anemia.  Patient was recently hospitalized due to acute respiratory failure secondary to diastolic CHF exacerbation. She was initially admitted to ICU, intubated and required mechanical ventilation. She also had acute on chronic kidney failure. Her recent labs showed microcytic anemia with hemoglobin  6.5, MCV 67.9. She received blood transfusion on 02/24/2017. Hemoglobin improved to 8.6.  Patient reports fatigue, and a lack of energy. She has some lower extremity edema. She lives at home by herself and is able to do her ADLs. She takes warfarin for A. Fib. Denies any bleeding events, blood in the stool or black stool.  ROS:  Review of Systems  Constitutional: Positive for fatigue.  HENT:  Negative.   Eyes: Negative.   Respiratory: Positive for shortness of breath.   Cardiovascular: Positive for leg swelling.  Gastrointestinal: Negative.   Endocrine: Negative.   Genitourinary: Negative.    Musculoskeletal: Negative.   Skin: Negative.   Neurological: Negative.   Hematological: Negative.   Psychiatric/Behavioral: Negative.     MEDICAL HISTORY:  Past Medical History:  Diagnosis Date  . Anemia   . Anxiety   . Chronic kidney disease    INSUFFICIENCY  . COPD (chronic obstructive pulmonary disease) ([place])   . Diabetes mellitus without complication ([place])   . Dyspnea    DOE  .  Dysrhythmia    A FIB  . Edema   . GERD (gastroesophageal reflux disease)   . History of kidney stones   . Hypertension   . Hypothyroidism    ABLATION  . Neuropathy   . Orthopnea   . Sleep apnea    CPAP  . Vertigo   . Wheezing     SURGICAL HISTORY: Past Surgical History:  Procedure Laterality Date  . CATARACT EXTRACTION W/PHACO Left 01/26/2017   Procedure: CATARACT EXTRACTION PHACO AND INTRAOCULAR LENS PLACEMENT (IOC);  Surgeon: Estill Cotta, MD;  Location: [facility] ORS;  Service: Ophthalmology;  Laterality: Left;  [place]   1:02.2 AP     23.7 CDE   28.92 fluid pack lot# [id number] H exp.05/08/2018  . CHOLECYSTECTOMY    . TONSILLECTOMY      SOCIAL HISTORY: Social History   Social History  . Marital status: Legally Separated    Spouse name: N/A  . Number of children: N/A  . Years of education: N/A   Occupational History  . Not on file.   Social History Main Topics  . Smoking status: Former Smoker    Packs/day: 3.00    Types: Cigarettes    Quit date: 87  . Smokeless tobacco: Never Used  . Alcohol use No  . Drug use: Unknown  . Sexual activity: Not on file   Other Topics Concern  . Not on file   Social History Narrative  . No narrative on file    FAMILY HISTORY: Family History  Problem Relation Age of Onset  . COPD Mother   . Heart disease Mother   . Anemia Mother   .  Heart disease Father   . COPD Father   . Anemia Sister   . Diabetes Maternal Grandmother   . Hypertension Paternal Grandfather     ALLERGIES:  is allergic to other.  MEDICATIONS:  Current Outpatient Prescriptions  Medication Sig Dispense Refill  . albuterol (PROAIR HFA) 108 (90 Base) MCG/ACT inhaler inhale 2 puffs by mouth INTO LUNGS every 4 to 6 hours if needed for wheezing or shortness of breath    . amiodarone (PACERONE) 400 MG tablet Take 0.5 tablets (200 mg total) by mouth daily. 30 tablet 2  . Calcium Carbonate-Vitamin D3 (CALCIUM 600-D) 600-400 MG-UNIT TABS Take 1 tablet by mouth  daily.    . Cholecalciferol (D3 HIGH POTENCY) 2000 units CAPS Take 1 capsule by mouth daily.    . citalopram (CELEXA) 20 MG tablet Take 20 mg by mouth daily.    Marland Kitchen co-enzyme Q-10 50 MG capsule Take 50 mg by mouth daily.    . Garlic 6759 MG CAPS Take 1 capsule by mouth daily.    Marland Kitchen glimepiride (AMARYL) 2 MG tablet Take 2 mg by mouth daily with breakfast.    . HUMALOG KWIKPEN 100 UNIT/ML KiwkPen Inject 40 Units into the skin 3 (three) times daily.  0  . insulin glargine (LANTUS) 100 UNIT/ML injection Inject 0.18 mLs (18 Units total) into the skin at bedtime. 10 mL 11  . levothyroxine (SYNTHROID, LEVOTHROID) 175 MCG tablet Take 175 mcg by mouth daily before breakfast.    . linagliptin (TRADJENTA) 5 MG TABS tablet Take 5 mg by mouth daily.    Marland Kitchen lovastatin (MEVACOR) 20 MG tablet Take 20 mg by mouth at bedtime.    . magnesium oxide (MAG-OX) 400 MG tablet Take 400 mg by mouth 2 (two) times daily.    . meclizine (ANTIVERT) 12.5 MG tablet Take 1 tablet (12.5 mg total) by mouth 3 (three) times daily as needed for dizziness. 30 tablet 0  . metoprolol tartrate (LOPRESSOR) 25 MG tablet Take 1 tablet (25 mg total) by mouth 2 (two) times daily. 60 tablet 2  . Omega-3 Fatty Acids (FISH OIL) 1000 MG CPDR Take 1 capsule by mouth daily.    Marland Kitchen oxyCODONE (OXY IR/ROXICODONE) 5 MG immediate release tablet Take 1 tablet (5 mg total) by mouth every 4 (four) hours as needed for moderate pain. 20 tablet 0  . pantoprazole (PROTONIX) 40 MG tablet Take 40 mg by mouth 2 (two) times daily.    Marland Kitchen torsemide (DEMADEX) 20 MG tablet Take 20 mg by mouth 2 (two) times daily.    . vitamin C (ASCORBIC ACID) 500 MG tablet Take 500 mg by mouth daily.    Marland Kitchen warfarin (COUMADIN) 1 MG tablet Take 0.5 mg by mouth daily at 6 PM.    . warfarin (COUMADIN) 7.5 MG tablet Take 7.5 mg by mouth daily at 6 PM.     No current facility-administered medications for this visit.       Marland Kitchen  PHYSICAL EXAMINATION: ECOG PERFORMANCE STATUS: 2 - Symptomatic,  <50% confined to bed Vitals:   03/31/17 1012  BP: 135/75  Pulse: (!) 53  Resp: 18  Temp: 97.9 F (36.6 C)   Filed Weights   03/31/17 1012  Weight: 299 lb 7.9 oz (135.9 kg)    GENERAL:  Alert, no distress and comfortable. Morbid obese EYES: Conjunctivae pallor no icterus OROPHARYNX: no thrush or ulceration; good dentition  NECK: supple, no masses felt LYMPH:  no palpable lymphadenopathy in the cervical, axillary or inguinal regions LUNGS:  clear to auscultation and  No wheeze or crackles HEART/CVS: regular rate & rhythm and no murmurs; +1 lower extremity edema ABDOMEN: abdomen soft, non-tender and normal bowel sounds Musculoskeletal:no cyanosis of digits and no clubbing  PSYCH: alert & oriented x 3  NEURO: no focal motor/sensory deficits SKIN:  no rashes or significant lesions. She has bruises on her upper extremity bilaterally, from CPR.  LABORATORY DATA:  I have reviewed the data as listed Lab Results  Component Value Date   WBC 13.1 (H) 03/02/2017   HGB 8.6 (L) 03/02/2017   HCT 28.8 (L) 03/02/2017   MCV 70.2 (L) 03/02/2017   PLT 286 03/02/2017    Recent Labs  02/22/17 0755  02/24/17 0403 02/25/17 0501  02/27/17 0449 02/28/17 0339 03/01/17 0506 03/03/17 0404  NA 139  < > 141 141  < > 141  --  137 139  K 4.3  < > 4.7 4.1  < > 4.7 4.1 4.2 3.9  CL 103  < > 102 100*  < > 99*  --  97* 101  CO2 20*  < > 29 30  < > 32  --  29 30  GLUCOSE 356*  < > 115* 175*  < > 137*  --  169* 195*  BUN 37*  < > 66* 53*  < > 37*  --  39* 41*  CREATININE 2.11*  < > 2.77* 1.96*  < > 1.60* 1.56* 1.53* 1.59*  CALCIUM 8.6*  < > 7.7* 8.6*  < > 9.3  --  8.9 8.9  GFRNONAA 24*  < > 17* 26*  < > 34* 35* 36* 34*  GFRAA 28*  < > 20* 31*  < > 39* 40* 41* 39*  PROT 7.8  --  6.4* 7.2  --   --   --   --   --   ALBUMIN 3.5  --  3.0* 3.0*  --   --   --   --   --   AST 38  --  29 23  --   --   --   --   --   ALT 17  --  24 21  --   --   --   --   --   ALKPHOS 87  --  58 70  --   --   --   --   --     BILITOT 0.5  --  0.8 1.2  --   --   --   --   --   < > = values in this interval not displayed.   Labs obtained at the nephrologist office showed Peripheral blood Protein electrophoresis revealed no monoclonal spike. Urine protein electrophoresis negative for monoclonal spike. Hemoglobin 7.9, WBC 12.1, MCV 72, RDW 19, platelet 283,000, neutrophil 9.2, lymphocyte 1.6, monocyte 1.0, a ANA negative.  RADIOGRAPHIC STUDIES: I have personally reviewed the radiological images as listed and agreed with the findings in the report. Dg Chest 2 View  Result Date: 03/02/2017 CLINICAL DATA:  Patient recently underwent CPR. History of COPD, diabetes, chronic renal insufficiency. Former smoker. EXAM: CHEST  2 VIEW COMPARISON:  Portable chest x-ray of February 27, 2017 FINDINGS: The lungs are adequately inflated. The interstitial markings are coarse at both bases but have improved since the study of 3 days ago. Small amounts of pleural fluid blunt the posterior costophrenic angles. The cardiac silhouette remains enlarged. The pulmonary vascularity is less engorged. The trachea is midline. The bony thorax exhibits  no acute abnormality. IMPRESSION: Improved appearance of the chest over the study of 3 days ago. Persistent bibasilar atelectasis or pneumonia. Interval marked improvement in pulmonary edema. Electronically Signed   By: David  Martinique M.D.   On: 03/02/2017 16:02    ASSESSMENT & PLAN:  1. Microcytic anemia   2. CKD (chronic kidney disease) stage 3, GFR 30-59 ml/min   3. Other specified hypothyroidism   4. Hypothyroidism, unspecified type      Her anemia likely from chronic kidney disease, in combination with possible iron deficiency. Check iron TIBC, ferritin, folate,  occult stool blood x3. Reticulocyte. If she has functional iron deficiency, will give IV Venofer before procrit.  Follow-up was primary care physician regarding optimizing hypothyroidism management. All questions were answered. The  patient knows to call the clinic with any problems questions or concerns.  Return of visit: 3 days possible encounter of IV iron infusion/Procrit. See me in 1 week.  Thank you for this kind referral and the opportunity to participate in the care of this patient. A copy of today's note is routed to referring provider Dr.lateef.    Earlie Server, MD, PhD Hematology Oncology [facility] at [facility] Pager- [phone] 03/31/2017

## 2017-03-31 NOTE — Progress Notes (Signed)
Patient here today as a new patient for anemia  

## 2017-04-01 DIAGNOSIS — D509 Iron deficiency anemia, unspecified: Secondary | ICD-10-CM | POA: Diagnosis not present

## 2017-04-02 DIAGNOSIS — D509 Iron deficiency anemia, unspecified: Secondary | ICD-10-CM | POA: Diagnosis not present

## 2017-04-03 DIAGNOSIS — D509 Iron deficiency anemia, unspecified: Secondary | ICD-10-CM | POA: Diagnosis not present

## 2017-04-04 ENCOUNTER — Inpatient Hospital Stay: Payer: Medicare Other

## 2017-04-04 VITALS — BP 133/48 | HR 59 | Temp 96.5°F | Resp 18

## 2017-04-04 DIAGNOSIS — N183 Chronic kidney disease, stage 3 unspecified: Secondary | ICD-10-CM

## 2017-04-04 DIAGNOSIS — D509 Iron deficiency anemia, unspecified: Secondary | ICD-10-CM

## 2017-04-04 DIAGNOSIS — E038 Other specified hypothyroidism: Secondary | ICD-10-CM

## 2017-04-04 DIAGNOSIS — E039 Hypothyroidism, unspecified: Secondary | ICD-10-CM

## 2017-04-04 LAB — HEMOGLOBINOPATHY EVALUATION
HGB A: 98.2 % (ref 96.4–98.8)
HGB VARIANT: 0 %
Hgb A2 Quant: 1.8 % (ref 1.8–3.2)
Hgb C: 0 %
Hgb F Quant: 0 % (ref 0.0–2.0)
Hgb S Quant: 0 %

## 2017-04-04 LAB — OCCULT BLOOD X 1 CARD TO LAB, STOOL
FECAL OCCULT BLD: NEGATIVE
Fecal Occult Bld: NEGATIVE
Fecal Occult Bld: NEGATIVE

## 2017-04-04 MED ORDER — SODIUM CHLORIDE 0.9 % IV SOLN
Freq: Once | INTRAVENOUS | Status: AC
  Administered 2017-04-04: 11:00:00 via INTRAVENOUS
  Filled 2017-04-04: qty 1000

## 2017-04-04 MED ORDER — IRON SUCROSE 20 MG/ML IV SOLN
200.0000 mg | Freq: Once | INTRAVENOUS | Status: AC
  Administered 2017-04-04: 200 mg via INTRAVENOUS
  Filled 2017-04-04: qty 10

## 2017-04-04 MED ORDER — SODIUM CHLORIDE 0.9 % IV SOLN: 200.0000 mg | Freq: Once | INTRAVENOUS | Status: DC

## 2017-04-07 ENCOUNTER — Inpatient Hospital Stay: Payer: Medicare Other

## 2017-04-07 ENCOUNTER — Encounter: Payer: Self-pay | Admitting: Oncology

## 2017-04-07 ENCOUNTER — Other Ambulatory Visit
Admission: RE | Admit: 2017-04-07 | Discharge: 2017-04-07 | Disposition: A | Discharge status: Home / Self Care | Payer: Medicare Other | Source: Ambulatory Visit | Attending: Cardiology | Admitting: Cardiology

## 2017-04-07 ENCOUNTER — Inpatient Hospital Stay (HOSPITAL_BASED_OUTPATIENT_CLINIC_OR_DEPARTMENT_OTHER): Payer: Medicare Other | Admitting: Oncology

## 2017-04-07 VITALS — BP 146/68 | HR 68 | Temp 98.2°F | Wt 300.7 lb

## 2017-04-07 DIAGNOSIS — D509 Iron deficiency anemia, unspecified: Secondary | ICD-10-CM

## 2017-04-07 DIAGNOSIS — I4891 Unspecified atrial fibrillation: Secondary | ICD-10-CM | POA: Insufficient documentation

## 2017-04-07 DIAGNOSIS — N183 Chronic kidney disease, stage 3 unspecified: Secondary | ICD-10-CM

## 2017-04-07 DIAGNOSIS — E038 Other specified hypothyroidism: Secondary | ICD-10-CM

## 2017-04-07 DIAGNOSIS — E039 Hypothyroidism, unspecified: Secondary | ICD-10-CM

## 2017-04-07 DIAGNOSIS — Z79899 Other long term (current) drug therapy: Secondary | ICD-10-CM | POA: Diagnosis not present

## 2017-04-07 DIAGNOSIS — I130 Hypertensive heart and chronic kidney disease with heart failure and stage 1 through stage 4 chronic kidney disease, or unspecified chronic kidney disease: Secondary | ICD-10-CM | POA: Diagnosis not present

## 2017-04-07 LAB — CBC WITH DIFFERENTIAL/PLATELET
BASOS ABS: 0.1 10*3/uL (ref 0–0.1)
BASOS PCT: 1 %
Eosinophils Absolute: 0.2 10*3/uL (ref 0–0.7)
Eosinophils Relative: 1 %
HEMATOCRIT: 28.6 % — AB (ref 35.0–47.0)
HEMOGLOBIN: 8.5 g/dL — AB (ref 12.0–16.0)
Lymphocytes Relative: 11 %
Lymphs Abs: 1.3 10*3/uL (ref 1.0–3.6)
MCH: 20.9 pg — ABNORMAL LOW (ref 26.0–34.0)
MCHC: 29.9 g/dL — ABNORMAL LOW (ref 32.0–36.0)
MCV: 69.8 fL — AB (ref 80.0–100.0)
MONO ABS: 1 10*3/uL — AB (ref 0.2–0.9)
MONOS PCT: 8 %
Neutro Abs: 9.4 10*3/uL — ABNORMAL HIGH (ref 1.4–6.5)
Neutrophils Relative %: 79 %
Platelets: 324 10*3/uL (ref 150–440)
RBC: 4.09 MIL/uL (ref 3.80–5.20)
RDW: 20.8 % — ABNORMAL HIGH (ref 11.5–14.5)
WBC: 11.9 10*3/uL — ABNORMAL HIGH (ref 3.6–11.0)

## 2017-04-07 LAB — PROTIME-INR
INR: 2.74
Prothrombin Time: 28.8 seconds — ABNORMAL HIGH (ref 11.4–15.2)

## 2017-04-07 NOTE — Progress Notes (Signed)
Patient here today for follow up.  Patient states no new concerns today  

## 2017-04-07 NOTE — Progress Notes (Signed)
Hematology/Oncology Follow up visit [facility] Telephone:(336) [phone] Fax:(336) [phone]  CONSULT NOTE Patient Care Team: Sofie Hartigan, MD as PCP - General (Family Medicine) Alisa Graff, FNP as Nurse Practitioner (Family Medicine) Ubaldo Glassing Javier Docker, MD as Consulting Physician (Cardiology) Elease Etienne, MD as Consulting Physician (Endocrinology)  CHIEF COMPLAINTS/PURPOSE OF CONSULTATION:  Anemia    HISTORY OF PRESENTING ILLNESS:  Andrea Rivers 61 y.o.  female with past medical history listed as below who was referred by Dr. Holley Raring to me for evaluation and management of anemia.  Patient was recently hospitalized due to acute respiratory failure secondary to diastolic CHF exacerbation. She was initially admitted to ICU, intubated and required mechanical ventilation. She also had acute on chronic kidney failure. Her recent labs showed microcytic anemia with hemoglobin  6.5, MCV 67.9. She received blood transfusion on 02/24/2017. Hemoglobin improved to 8.6.  Patient reports fatigue, and a lack of energy. She has some lower extremity edema. She lives at home by herself and is able to do her ADLs. She takes warfarin for A. Fib. Denies any bleeding events, blood in the stool or black stool. INTERVAL HISTORY Patient presents to reevaluate of IV iron treatment. Patient has received 1 dose of IV Venofer, she tolerates well. Her energy level has improved much, feels the same at baseline.     ROS:  Review of Systems  Constitutional: Positive for fatigue.  HENT:  Negative.   Eyes: Negative.   Respiratory: Positive for shortness of breath.   Cardiovascular: Positive for leg swelling.  Gastrointestinal: Negative.   Endocrine: Negative.   Genitourinary: Negative.    Musculoskeletal: Negative.   Skin: Negative.   Neurological: Negative.   Hematological: Negative.   Psychiatric/Behavioral: Negative.     MEDICAL HISTORY:  Past Medical History:  Diagnosis  Date  . Anemia   . Anxiety   . Chronic kidney disease    INSUFFICIENCY  . COPD (chronic obstructive pulmonary disease) ([place])   . Diabetes mellitus without complication ([place])   . Dyspnea    DOE  . Dysrhythmia    A FIB  . Edema   . GERD (gastroesophageal reflux disease)   . History of kidney stones   . Hypertension   . Hypothyroidism    ABLATION  . Iron deficiency anemia 03/31/2017  . Neuropathy   . Orthopnea   . Sleep apnea    CPAP  . Vertigo   . Wheezing     SURGICAL HISTORY: Past Surgical History:  Procedure Laterality Date  . CATARACT EXTRACTION W/PHACO Left 01/26/2017   Procedure: CATARACT EXTRACTION PHACO AND INTRAOCULAR LENS PLACEMENT (IOC);  Surgeon: Estill Cotta, MD;  Location: [facility] ORS;  Service: Ophthalmology;  Laterality: Left;  [place]   1:02.2 AP     23.7 CDE   28.92 fluid pack lot# [id number] H exp.05/08/2018  . CHOLECYSTECTOMY    . TONSILLECTOMY      SOCIAL HISTORY: Social History   Social History  . Marital status: Legally Separated    Spouse name: N/A  . Number of children: N/A  . Years of education: N/A   Occupational History  . Not on file.   Social History Main Topics  . Smoking status: Former Smoker    Packs/day: 3.00    Types: Cigarettes    Quit date: 15  . Smokeless tobacco: Never Used  . Alcohol use No  . Drug use: Unknown  . Sexual activity: Not on file   Other Topics Concern  . Not on file  Social History Narrative  . No narrative on file    FAMILY HISTORY: Family History  Problem Relation Age of Onset  . COPD Mother   . Heart disease Mother   . Anemia Mother   . Heart disease Father   . COPD Father   . Anemia Sister   . Diabetes Maternal Grandmother   . Hypertension Paternal Grandfather     ALLERGIES:  is allergic to other.  MEDICATIONS:  Current Outpatient Prescriptions  Medication Sig Dispense Refill  . albuterol (PROAIR HFA) 108 (90 Base) MCG/ACT inhaler inhale 2 puffs by mouth INTO LUNGS every 4 to 6  hours if needed for wheezing or shortness of breath    . amiodarone (PACERONE) 400 MG tablet Take 0.5 tablets (200 mg total) by mouth daily. 30 tablet 2  . Calcium Carbonate-Vitamin D3 (CALCIUM 600-D) 600-400 MG-UNIT TABS Take 1 tablet by mouth daily.    . Cholecalciferol (D3 HIGH POTENCY) 2000 units CAPS Take 1 capsule by mouth daily.    . citalopram (CELEXA) 20 MG tablet Take 20 mg by mouth daily.    Marland Kitchen co-enzyme Q-10 50 MG capsule Take 50 mg by mouth daily.    . Garlic 7035 MG CAPS Take 1 capsule by mouth daily.    Marland Kitchen glimepiride (AMARYL) 2 MG tablet Take 2 mg by mouth daily with breakfast.    . HUMALOG KWIKPEN 100 UNIT/ML KiwkPen Inject 40 Units into the skin 3 (three) times daily.  0  . insulin glargine (LANTUS) 100 UNIT/ML injection Inject 0.18 mLs (18 Units total) into the skin at bedtime. 10 mL 11  . levothyroxine (SYNTHROID, LEVOTHROID) 175 MCG tablet Take 175 mcg by mouth daily before breakfast.    . linagliptin (TRADJENTA) 5 MG TABS tablet Take 5 mg by mouth daily.    Marland Kitchen lovastatin (MEVACOR) 20 MG tablet Take 20 mg by mouth at bedtime.    . magnesium oxide (MAG-OX) 400 MG tablet Take 400 mg by mouth 2 (two) times daily.    . meclizine (ANTIVERT) 12.5 MG tablet Take 1 tablet (12.5 mg total) by mouth 3 (three) times daily as needed for dizziness. 30 tablet 0  . metoprolol tartrate (LOPRESSOR) 25 MG tablet Take 1 tablet (25 mg total) by mouth 2 (two) times daily. 60 tablet 2  . Omega-3 Fatty Acids (FISH OIL) 1000 MG CPDR Take 1 capsule by mouth daily.    Marland Kitchen oxyCODONE (OXY IR/ROXICODONE) 5 MG immediate release tablet Take 1 tablet (5 mg total) by mouth every 4 (four) hours as needed for moderate pain. 20 tablet 0  . pantoprazole (PROTONIX) 40 MG tablet Take 40 mg by mouth 2 (two) times daily.    Marland Kitchen torsemide (DEMADEX) 20 MG tablet Take 20 mg by mouth 2 (two) times daily.    . vitamin C (ASCORBIC ACID) 500 MG tablet Take 500 mg by mouth daily.    Marland Kitchen warfarin (COUMADIN) 1 MG tablet Take 0.5 mg by  mouth daily at 6 PM.    . warfarin (COUMADIN) 7.5 MG tablet Take 7.5 mg by mouth daily at 6 PM.     No current facility-administered medications for this visit.       Marland Kitchen  PHYSICAL EXAMINATION: ECOG PERFORMANCE STATUS: 2 - Symptomatic, <50% confined to bed Vitals:   04/07/17 1117  BP: (!) 146/68  Pulse: 68  Temp: 98.2 F (36.8 C)   Filed Weights   04/07/17 1117  Weight: (!) 300 lb 11.3 oz (136.4 kg)    GENERAL:  Alert, no distress and comfortable. Morbid obese EYES: Conjunctivae pallor no icterus OROPHARYNX: no thrush or ulceration; good dentition  NECK: supple, no masses felt LYMPH:  no palpable lymphadenopathy in the cervical, axillary or inguinal regions LUNGS: clear to auscultation and  No wheeze or crackles HEART/CVS: regular rate & rhythm and no murmurs; +1 lower extremity edema ABDOMEN: abdomen soft, non-tender and normal bowel sounds Musculoskeletal:no cyanosis of digits and no clubbing  PSYCH: alert & oriented x 3  NEURO: no focal motor/sensory deficits SKIN:  no rashes or significant lesions. She has bruises on her upper extremity bilaterally, from CPR.  LABORATORY DATA:  I have reviewed the data as listed Lab Results  Component Value Date   WBC 11.9 (H) 04/07/2017   HGB 8.5 (L) 04/07/2017   HCT 28.6 (L) 04/07/2017   MCV 69.8 (L) 04/07/2017   PLT 324 04/07/2017    Recent Labs  02/24/17 0403 02/25/17 0501  03/01/17 0506 03/03/17 0404 03/31/17 1035  NA 141 141  < > 137 139 139  K 4.7 4.1  < > 4.2 3.9 4.3  CL 102 100*  < > 97* 101 99*  CO2 29 30  < > 29 30 33*  GLUCOSE 115* 175*  < > 169* 195* 132*  BUN 66* 53*  < > 39* 41* 27*  CREATININE 2.77* 1.96*  < > 1.53* 1.59* 1.50*  CALCIUM 7.7* 8.6*  < > 8.9 8.9 8.9  GFRNONAA 17* 26*  < > 36* 34* 36*  GFRAA 20* 31*  < > 41* 39* 42*  PROT 6.4* 7.2  --   --   --  7.8  ALBUMIN 3.0* 3.0*  --   --   --  3.6  AST 29 23  --   --   --  17  ALT 24 21  --   --   --  10*  ALKPHOS 58 70  --   --   --  100    BILITOT 0.8 1.2  --   --   --  0.7  < > = values in this interval not displayed.   Labs obtained at the nephrologist office showed Peripheral blood Protein electrophoresis revealed no monoclonal spike. Urine protein electrophoresis negative for monoclonal spike. Hemoglobin 7.9, WBC 12.1, MCV 72, RDW 19, platelet 283,000, neutrophil 9.2, lymphocyte 1.6, monocyte 1.0, a ANA negative. CBC    Component Value Date/Time   WBC 11.8 (H) 03/31/2017 1035   RBC 4.17 03/31/2017 1035   HGB 8.7 (L) 03/31/2017 1035   HCT 29.0 (L) 03/31/2017 1035   PLT 256 03/31/2017 1035   MCV 69.5 (L) 03/31/2017 1035   MCH 20.9 (L) 03/31/2017 1035   MCHC 30.1 (L) 03/31/2017 1035   RDW 20.5 (H) 03/31/2017 1035   LYMPHSABS 1.5 03/31/2017 1035   MONOABS 1.0 (H) 03/31/2017 1035   EOSABS 0.3 03/31/2017 1035   BASOSABS 0.1 03/31/2017 1035   RADIOGRAPHIC STUDIES: I have personally reviewed the radiological images as listed and agreed with the findings in the report. No results found.  ASSESSMENT & PLAN:  1. Iron deficiency anemia, unspecified iron deficiency anemia type   2. CKD (chronic kidney disease) stage 3, GFR 30-59 ml/min   3. Hypothyroidism, unspecified type   4. Microcytic anemia    Her anemia likely from chronic kidney disease, as well as iron deficiency.  Before giving procrit, need to replete her iron store. IV vernofer twice a week for 3 more doses. Target Ferritin at least more than  300.  Follow-up was primary care physician regarding optimizing hypothyroidism management. All questions were answered. The patient knows to call the clinic with any problems questions or concerns.  Return of visit: 2 weeks with cbc iron tibc ferritin done 1-2 days before visit. Possible procrit encounter.  Thank you for this kind referral and the opportunity to participate in the care of this patient. A copy of today's note is routed to referring provider Dr.[email or web address].    Earlie Server, MD, PhD Hematology Oncology [facility] at  [facility] Pager- [phone] 04/07/2017

## 2017-04-08 ENCOUNTER — Inpatient Hospital Stay: Payer: Medicare Other

## 2017-04-08 VITALS — BP 113/68 | HR 58 | Temp 97.4°F | Resp 18

## 2017-04-08 DIAGNOSIS — D509 Iron deficiency anemia, unspecified: Secondary | ICD-10-CM | POA: Diagnosis not present

## 2017-04-08 MED ORDER — IRON SUCROSE 20 MG/ML IV SOLN
200.0000 mg | Freq: Once | INTRAVENOUS | Status: AC
  Administered 2017-04-08: 200 mg via INTRAVENOUS
  Filled 2017-04-08: qty 10

## 2017-04-08 MED ORDER — IRON SUCROSE 20 MG/ML IV SOLN
INTRAVENOUS | Status: AC
  Filled 2017-04-08: qty 10

## 2017-04-08 MED ORDER — SODIUM CHLORIDE 0.9 % IV SOLN
Freq: Once | INTRAVENOUS | Status: AC
  Administered 2017-04-08: 10:00:00 via INTRAVENOUS
  Filled 2017-04-08: qty 1000

## 2017-04-12 ENCOUNTER — Inpatient Hospital Stay: Payer: Medicare Other | Attending: Oncology

## 2017-04-12 ENCOUNTER — Ambulatory Visit: Payer: Medicare Other | Attending: Family | Admitting: Family

## 2017-04-12 ENCOUNTER — Encounter: Payer: Self-pay | Admitting: Family

## 2017-04-12 VITALS — BP 116/70 | HR 56 | Resp 20

## 2017-04-12 VITALS — BP 110/58 | HR 63 | Resp 20 | Ht 66.0 in | Wt 303.5 lb

## 2017-04-12 DIAGNOSIS — Z794 Long term (current) use of insulin: Secondary | ICD-10-CM | POA: Diagnosis not present

## 2017-04-12 DIAGNOSIS — Z825 Family history of asthma and other chronic lower respiratory diseases: Secondary | ICD-10-CM | POA: Diagnosis not present

## 2017-04-12 DIAGNOSIS — I130 Hypertensive heart and chronic kidney disease with heart failure and stage 1 through stage 4 chronic kidney disease, or unspecified chronic kidney disease: Secondary | ICD-10-CM | POA: Insufficient documentation

## 2017-04-12 DIAGNOSIS — I5032 Chronic diastolic (congestive) heart failure: Secondary | ICD-10-CM | POA: Insufficient documentation

## 2017-04-12 DIAGNOSIS — Z87891 Personal history of nicotine dependence: Secondary | ICD-10-CM | POA: Insufficient documentation

## 2017-04-12 DIAGNOSIS — Z9049 Acquired absence of other specified parts of digestive tract: Secondary | ICD-10-CM | POA: Insufficient documentation

## 2017-04-12 DIAGNOSIS — E1122 Type 2 diabetes mellitus with diabetic chronic kidney disease: Secondary | ICD-10-CM | POA: Diagnosis not present

## 2017-04-12 DIAGNOSIS — Z888 Allergy status to other drugs, medicaments and biological substances status: Secondary | ICD-10-CM | POA: Insufficient documentation

## 2017-04-12 DIAGNOSIS — K219 Gastro-esophageal reflux disease without esophagitis: Secondary | ICD-10-CM | POA: Insufficient documentation

## 2017-04-12 DIAGNOSIS — I4891 Unspecified atrial fibrillation: Secondary | ICD-10-CM | POA: Insufficient documentation

## 2017-04-12 DIAGNOSIS — Z79899 Other long term (current) drug therapy: Secondary | ICD-10-CM | POA: Diagnosis not present

## 2017-04-12 DIAGNOSIS — N183 Chronic kidney disease, stage 3 (moderate): Secondary | ICD-10-CM | POA: Diagnosis not present

## 2017-04-12 DIAGNOSIS — G4730 Sleep apnea, unspecified: Secondary | ICD-10-CM | POA: Insufficient documentation

## 2017-04-12 DIAGNOSIS — F419 Anxiety disorder, unspecified: Secondary | ICD-10-CM | POA: Diagnosis not present

## 2017-04-12 DIAGNOSIS — Z8249 Family history of ischemic heart disease and other diseases of the circulatory system: Secondary | ICD-10-CM | POA: Diagnosis not present

## 2017-04-12 DIAGNOSIS — E1140 Type 2 diabetes mellitus with diabetic neuropathy, unspecified: Secondary | ICD-10-CM | POA: Diagnosis not present

## 2017-04-12 DIAGNOSIS — D509 Iron deficiency anemia, unspecified: Secondary | ICD-10-CM

## 2017-04-12 DIAGNOSIS — J449 Chronic obstructive pulmonary disease, unspecified: Secondary | ICD-10-CM | POA: Insufficient documentation

## 2017-04-12 DIAGNOSIS — N189 Chronic kidney disease, unspecified: Secondary | ICD-10-CM | POA: Insufficient documentation

## 2017-04-12 DIAGNOSIS — G4733 Obstructive sleep apnea (adult) (pediatric): Secondary | ICD-10-CM | POA: Insufficient documentation

## 2017-04-12 DIAGNOSIS — R42 Dizziness and giddiness: Secondary | ICD-10-CM | POA: Diagnosis not present

## 2017-04-12 DIAGNOSIS — Z833 Family history of diabetes mellitus: Secondary | ICD-10-CM | POA: Diagnosis not present

## 2017-04-12 DIAGNOSIS — Z9889 Other specified postprocedural states: Secondary | ICD-10-CM | POA: Insufficient documentation

## 2017-04-12 DIAGNOSIS — Z87442 Personal history of urinary calculi: Secondary | ICD-10-CM | POA: Insufficient documentation

## 2017-04-12 DIAGNOSIS — I10 Essential (primary) hypertension: Secondary | ICD-10-CM

## 2017-04-12 DIAGNOSIS — J9600 Acute respiratory failure, unspecified whether with hypoxia or hypercapnia: Secondary | ICD-10-CM | POA: Diagnosis not present

## 2017-04-12 DIAGNOSIS — E039 Hypothyroidism, unspecified: Secondary | ICD-10-CM | POA: Insufficient documentation

## 2017-04-12 DIAGNOSIS — Z8674 Personal history of sudden cardiac arrest: Secondary | ICD-10-CM | POA: Diagnosis not present

## 2017-04-12 DIAGNOSIS — E038 Other specified hypothyroidism: Secondary | ICD-10-CM | POA: Insufficient documentation

## 2017-04-12 DIAGNOSIS — Z7901 Long term (current) use of anticoagulants: Secondary | ICD-10-CM | POA: Diagnosis not present

## 2017-04-12 MED ORDER — SODIUM CHLORIDE 0.9 % IV SOLN
Freq: Once | INTRAVENOUS | Status: AC
  Administered 2017-04-12: 14:00:00 via INTRAVENOUS
  Filled 2017-04-12: qty 1000

## 2017-04-12 MED ORDER — SODIUM CHLORIDE 0.9 % IV SOLN: 200.0000 mg | Freq: Once | INTRAVENOUS | Status: DC

## 2017-04-12 MED ORDER — IRON SUCROSE 20 MG/ML IV SOLN
200.0000 mg | Freq: Once | INTRAVENOUS | Status: AC
  Administered 2017-04-12: 200 mg via INTRAVENOUS
  Filled 2017-04-12: qty 10

## 2017-04-12 NOTE — Patient Instructions (Signed)
Continue weighing daily and call for an overnight weight gain of > 2 pounds or a weekly weight gain of >5 pounds. 

## 2017-04-12 NOTE — Progress Notes (Signed)
Patient ID: Andrea Rivers, female    DOB: 10/23/1955, 61 y.o.   MRN: [id number]  HPI  Andrea Rivers is a 61 y/o female with a history of anemia, anxiety, CKD, COPD, DM, GERD, HTN, hypothyroidism, neuropathy, obstructive sleep apnea (+CPAP), previous tobacco use and chronic heart failure.  Echo done 01/26/17 shows an EF of 50-55%.   Admitted 02/22/17 due to HF exacerbation which led to cardiac arrest. Initially was intubated and received IV lasix. Pulmonology, cardiology and nephrology were consulted. Discharged to rehab after 9 days. Admitted 01/26/17 due to acute respiratory failure, etiology unclear. Pulmonology consult obtained and 5 days of augmentin was given at discharge. Discharged after 2 days.   She presents today for her follow-up visit with a chief complaint of mild shortness of breath upon moderate exertion. She describes this as chronic in nature having been present for several years with varying levels of severity. She has associated fatigue, edema and light-headedness along with this. She denies any chest pain. Has also noticed a gradual weight gain.   Past Medical History:  Diagnosis Date  . Anemia   . Anxiety   . Chronic kidney disease    INSUFFICIENCY  . COPD (chronic obstructive pulmonary disease) ([place])   . Diabetes mellitus without complication ([place])   . Dyspnea    DOE  . Dysrhythmia    A FIB  . Edema   . GERD (gastroesophageal reflux disease)   . History of kidney stones   . Hypertension   . Hypothyroidism    ABLATION  . Iron deficiency anemia 03/31/2017  . Neuropathy   . Orthopnea   . Sleep apnea    CPAP  . Vertigo   . Wheezing    Past Surgical History:  Procedure Laterality Date  . CATARACT EXTRACTION W/PHACO Left 01/26/2017   Procedure: CATARACT EXTRACTION PHACO AND INTRAOCULAR LENS PLACEMENT (IOC);  Surgeon: Estill Cotta, MD;  Location: [facility] ORS;  Service: Ophthalmology;  Laterality: Left;  [place]   1:02.2 AP     23.7 CDE   28.92 fluid pack lot#  [id number] H exp.05/08/2018  . CHOLECYSTECTOMY    . TONSILLECTOMY     Family History  Problem Relation Age of Onset  . COPD Mother   . Heart disease Mother   . Anemia Mother   . Heart disease Father   . COPD Father   . Anemia Sister   . Diabetes Maternal Grandmother   . Hypertension Paternal Grandfather    Social History  Substance Use Topics  . Smoking status: Former Smoker    Packs/day: 3.00    Types: Cigarettes    Quit date: 65  . Smokeless tobacco: Never Used  . Alcohol use No   Allergies  Allergen Reactions  . Other Anaphylaxis    ILOSONE,  Caused GI distress.     Prior to Admission medications   Medication Sig Start Date End Date Taking? Authorizing Provider  albuterol (PROAIR HFA) 108 (90 Base) MCG/ACT inhaler inhale 2 puffs by mouth INTO LUNGS every 4 to 6 hours if needed for wheezing or shortness of breath 03/14/17  Yes [provider]  amiodarone (PACERONE) 400 MG tablet Take 0.5 tablets (200 mg total) by mouth daily. 03/03/17  Yes Gladstone Lighter, MD  Calcium Carbonate-Vitamin D3 (CALCIUM 600-D) 600-400 MG-UNIT TABS Take 1 tablet by mouth daily.   Yes [provider]  Cholecalciferol (D3 HIGH POTENCY) 2000 units CAPS Take 1 capsule by mouth daily.   Yes [provider]  citalopram (CELEXA)  20 MG tablet Take 20 mg by mouth daily.   Yes [provider]  co-enzyme Q-10 50 MG capsule Take 50 mg by mouth daily.   Yes [provider]  Garlic 8657 MG CAPS Take 1 capsule by mouth daily.   Yes [provider]  glimepiride (AMARYL) 2 MG tablet Take 2 mg by mouth daily with breakfast.   Yes [provider]  HUMALOG KWIKPEN 100 UNIT/ML KiwkPen Inject 40 Units into the skin 3 (three) times daily. 03/14/17  Yes [provider]  insulin glargine (LANTUS) 100 UNIT/ML injection Inject 0.18 mLs (18 Units total) into the skin at bedtime. 03/03/17  Yes Gladstone Lighter, MD  levothyroxine (SYNTHROID, LEVOTHROID) 175  MCG tablet Take 175 mcg by mouth daily before breakfast.   Yes [provider]  linagliptin (TRADJENTA) 5 MG TABS tablet Take 5 mg by mouth daily.   Yes [provider]  lovastatin (MEVACOR) 20 MG tablet Take 20 mg by mouth at bedtime.   Yes [provider]  magnesium oxide (MAG-OX) 400 MG tablet Take 400 mg by mouth 2 (two) times daily.   Yes [provider]  meclizine (ANTIVERT) 12.5 MG tablet Take 1 tablet (12.5 mg total) by mouth 3 (three) times daily as needed for dizziness. 03/03/17  Yes Gladstone Lighter, MD  metoprolol tartrate (LOPRESSOR) 25 MG tablet Take 1 tablet (25 mg total) by mouth 2 (two) times daily. 03/03/17  Yes Gladstone Lighter, MD  Omega-3 Fatty Acids (FISH OIL) 1000 MG CPDR Take 1 capsule by mouth daily.   Yes [provider]  oxyCODONE (OXY IR/ROXICODONE) 5 MG immediate release tablet Take 1 tablet (5 mg total) by mouth every 4 (four) hours as needed for moderate pain. 03/03/17  Yes Gladstone Lighter, MD  pantoprazole (PROTONIX) 40 MG tablet Take 40 mg by mouth 2 (two) times daily.   Yes [provider]  torsemide (DEMADEX) 20 MG tablet Take 20 mg by mouth 2 (two) times daily. 03/28/17  Yes [provider]  vitamin C (ASCORBIC ACID) 500 MG tablet Take 500 mg by mouth daily.   Yes [provider]  warfarin (COUMADIN) 1 MG tablet Take 0.5 mg by mouth daily at 6 PM.   Yes [provider]  warfarin (COUMADIN) 7.5 MG tablet Take 7.5 mg by mouth daily at 6 PM.   Yes [provider]   Review of Systems  Constitutional: Positive for fatigue. Negative for appetite change.  HENT: Negative for congestion, postnasal drip and sore throat.   Eyes: Positive for visual disturbance (blurry vision in left eye). Negative for pain.  Respiratory: Positive for shortness of breath. Negative for cough and chest tightness.   Cardiovascular: Positive for leg swelling. Negative for chest pain and palpitations.   Gastrointestinal: Negative for abdominal distention and abdominal pain.  Endocrine: Negative.   Genitourinary: Negative.   Musculoskeletal: Positive for arthralgias (right shoulder) and back pain.          Skin: Negative.   Allergic/Immunologic: Negative.   Neurological: Positive for light-headedness (due to vertigo). Negative for dizziness.  Hematological: Negative for adenopathy. Bruises/bleeds easily.  Psychiatric/Behavioral: Negative for dysphoric mood and sleep disturbance (sleeping on 6 pillows due to comfort). The patient is not nervous/anxious.    Vitals:   04/12/17 1511 04/12/17 1514  BP:  (!) 110/58  Pulse: 63   Resp: 20   SpO2: 99%   Weight: (!) 303 lb 8 oz (137.7 kg)   Height: 5\' 6"  (1.676 m)  Wt Readings from Last 3 Encounters:  04/12/17 (!) 303 lb 8 oz (137.7 kg)  04/07/17 (!) 300 lb 11.3 oz (136.4 kg)  03/31/17 299 lb 7.9 oz (135.9 kg)    Lab Results  Component Value Date   CREATININE 1.50 (H) 03/31/2017   CREATININE 1.59 (H) 03/03/2017   CREATININE 1.53 (H) 03/01/2017   Physical Exam  Constitutional: She is oriented to person, place, and time. She appears well-developed and well-nourished.  HENT:  Head: Normocephalic and atraumatic.  Neck: Normal range of motion. Neck supple. No JVD present.  Cardiovascular: Normal rate and regular rhythm.   Pulmonary/Chest: Effort normal. She has no wheezes. She has no rales.  Abdominal: Soft. She exhibits no distension. There is no tenderness.  Musculoskeletal: She exhibits edema (1+ pitting edema in bilateral lower legs). She exhibits no tenderness.  Neurological: She is alert and oriented to person, place, and time.  Skin: Skin is warm and dry. Bruising (bilateral upper arms) noted.  Psychiatric: She has a normal mood and affect. Her behavior is normal. Thought content normal.  Nursing note and vitals reviewed.   Assessment & Plan:  1: Chronic heart failure with preserved ejection fraction- - NYHA class II -  mildly fluid overloaded today - weighing daily at home and has noticed a gradual weight gain. Instructed to call for an overnight weight gain of >2 pounds or a weekly weight gain of >5 pounds - not adding salt to her food and is currently on a sodium free diet. Reports that she reads food labels at home and tries to keep intake to 2000mg  sodium daily. - BMP from 03/31/17 reviewed and shows sodium 139, potassium 4.3 and GFR 36 - saw cardiologist (Ruby) 02/08/17 and returns in 6 months - does not elevate her legs much during the day because she says that she's always "on the go". Edema goes down overnight after having legs up in the bed - does not meet ReDS vest criteria due to BMI  2: HTN- - BP looks good today - saw PCP (Feldpausch) 02/07/17 and returns in September 2018  3: Diabetes- - glucose this morning was 110 fasting at home - A1c from 11/12/16 was 6.7% - saw endocrinologist Eddie Dibbles) 09/02/16  4: Obstructive sleep apnea- - has been wearing CPAP nightly   5: Iron deficiency anemia- - receiving iron transfusions and had one prior to coming to clinic today - CBC done 04/07/17 reviewed and shows hemoglobin 8.5 and HCT 28.6  Patient did not bring her medications nor a list. Each medication was verbally reviewed with the patient and she was encouraged to bring the bottles to every visit to confirm accuracy of list.  Return in 3 months or sooner for any questions/problems before then.

## 2017-04-13 ENCOUNTER — Telehealth: Payer: Self-pay | Admitting: Family

## 2017-04-13 NOTE — Telephone Encounter (Signed)
Returned call to patient regarding overnight weight gain. She says that yesterday she weighed 302.8 pounds and this morning she weighs 304.2 pounds.   She is currently taking 40mg  torsemide every morning and has not needed any potassium supplements. No change in her diet and no change in her other symptoms.  Advised patient to take an additional 20mg  torsemide early afternoon today and to let me know how her weight responds. Patient verbalized understanding.

## 2017-04-14 ENCOUNTER — Other Ambulatory Visit: Payer: Self-pay

## 2017-04-14 ENCOUNTER — Encounter: Payer: Self-pay | Admitting: Emergency Medicine

## 2017-04-14 ENCOUNTER — Emergency Department: Payer: Medicare Other

## 2017-04-14 ENCOUNTER — Emergency Department
Admission: EM | Admit: 2017-04-14 | Discharge: 2017-04-14 | Disposition: A | Discharge status: Home / Self Care | Payer: Medicare Other | Attending: Student in an Organized Health Care Education/Training Program | Admitting: Student in an Organized Health Care Education/Training Program

## 2017-04-14 DIAGNOSIS — Z7901 Long term (current) use of anticoagulants: Secondary | ICD-10-CM | POA: Insufficient documentation

## 2017-04-14 DIAGNOSIS — J449 Chronic obstructive pulmonary disease, unspecified: Secondary | ICD-10-CM | POA: Insufficient documentation

## 2017-04-14 DIAGNOSIS — I5032 Chronic diastolic (congestive) heart failure: Secondary | ICD-10-CM | POA: Diagnosis not present

## 2017-04-14 DIAGNOSIS — Z87891 Personal history of nicotine dependence: Secondary | ICD-10-CM | POA: Insufficient documentation

## 2017-04-14 DIAGNOSIS — I130 Hypertensive heart and chronic kidney disease with heart failure and stage 1 through stage 4 chronic kidney disease, or unspecified chronic kidney disease: Secondary | ICD-10-CM | POA: Diagnosis not present

## 2017-04-14 DIAGNOSIS — R0602 Shortness of breath: Secondary | ICD-10-CM | POA: Diagnosis present

## 2017-04-14 DIAGNOSIS — E1122 Type 2 diabetes mellitus with diabetic chronic kidney disease: Secondary | ICD-10-CM | POA: Diagnosis not present

## 2017-04-14 DIAGNOSIS — I509 Heart failure, unspecified: Secondary | ICD-10-CM | POA: Diagnosis not present

## 2017-04-14 DIAGNOSIS — R0609 Other forms of dyspnea: Secondary | ICD-10-CM | POA: Insufficient documentation

## 2017-04-14 DIAGNOSIS — Z794 Long term (current) use of insulin: Secondary | ICD-10-CM | POA: Diagnosis not present

## 2017-04-14 DIAGNOSIS — Z79899 Other long term (current) drug therapy: Secondary | ICD-10-CM | POA: Diagnosis not present

## 2017-04-14 DIAGNOSIS — E039 Hypothyroidism, unspecified: Secondary | ICD-10-CM | POA: Insufficient documentation

## 2017-04-14 DIAGNOSIS — N189 Chronic kidney disease, unspecified: Secondary | ICD-10-CM | POA: Diagnosis not present

## 2017-04-14 LAB — CBC WITH DIFFERENTIAL/PLATELET
BASOS ABS: 0 10*3/uL (ref 0–0.1)
Basophils Relative: 0 %
EOS PCT: 2 %
Eosinophils Absolute: 0.2 10*3/uL (ref 0–0.7)
HCT: 30 % — ABNORMAL LOW (ref 35.0–47.0)
Hemoglobin: 9.2 g/dL — ABNORMAL LOW (ref 12.0–16.0)
LYMPHS PCT: 7 %
Lymphs Abs: 0.8 10*3/uL — ABNORMAL LOW (ref 1.0–3.6)
MCH: 22 pg — AB (ref 26.0–34.0)
MCHC: 30.5 g/dL — AB (ref 32.0–36.0)
MCV: 72 fL — AB (ref 80.0–100.0)
MONO ABS: 0.7 10*3/uL (ref 0.2–0.9)
MONOS PCT: 6 %
Neutro Abs: 9.4 10*3/uL — ABNORMAL HIGH (ref 1.4–6.5)
Neutrophils Relative %: 85 %
PLATELETS: 279 10*3/uL (ref 150–440)
RBC: 4.17 MIL/uL (ref 3.80–5.20)
RDW: 22.4 % — AB (ref 11.5–14.5)
WBC: 11 10*3/uL (ref 3.6–11.0)

## 2017-04-14 LAB — PROTIME-INR
INR: 2.5
Prothrombin Time: 26.8 seconds — ABNORMAL HIGH (ref 11.4–15.2)

## 2017-04-14 LAB — URINALYSIS, COMPLETE (UACMP) WITH MICROSCOPIC
BILIRUBIN URINE: NEGATIVE
Bacteria, UA: NONE SEEN
GLUCOSE, UA: NEGATIVE mg/dL
HGB URINE DIPSTICK: NEGATIVE
Ketones, ur: NEGATIVE mg/dL
LEUKOCYTES UA: NEGATIVE
NITRITE: NEGATIVE
PROTEIN: NEGATIVE mg/dL
Specific Gravity, Urine: 1.005 (ref 1.005–1.030)
Squamous Epithelial / LPF: NONE SEEN
pH: 7 (ref 5.0–8.0)

## 2017-04-14 LAB — COMPREHENSIVE METABOLIC PANEL
ALBUMIN: 3.5 g/dL (ref 3.5–5.0)
ALK PHOS: 98 U/L (ref 38–126)
ALT: 10 U/L — ABNORMAL LOW (ref 14–54)
ANION GAP: 11 (ref 5–15)
AST: 25 U/L (ref 15–41)
BILIRUBIN TOTAL: 0.6 mg/dL (ref 0.3–1.2)
BUN: 23 mg/dL — ABNORMAL HIGH (ref 6–20)
CALCIUM: 9 mg/dL (ref 8.9–10.3)
CO2: 30 mmol/L (ref 22–32)
Chloride: 99 mmol/L — ABNORMAL LOW (ref 101–111)
Creatinine, Ser: 1.66 mg/dL — ABNORMAL HIGH (ref 0.44–1.00)
GFR, EST AFRICAN AMERICAN: 37 mL/min — AB (ref >60)
GFR, EST NON AFRICAN AMERICAN: 32 mL/min — AB (ref >60)
Glucose, Bld: 146 mg/dL — ABNORMAL HIGH (ref 65–99)
POTASSIUM: 4.2 mmol/L (ref 3.5–5.1)
Sodium: 140 mmol/L (ref 135–145)
TOTAL PROTEIN: 7.7 g/dL (ref 6.5–8.1)

## 2017-04-14 LAB — APTT: APTT: 54 s — AB (ref 24–36)

## 2017-04-14 LAB — TROPONIN I: Troponin I: <0.03 ng/mL (ref <0.03)

## 2017-04-14 LAB — BRAIN NATRIURETIC PEPTIDE: B Natriuretic Peptide: 413 pg/mL — ABNORMAL HIGH (ref 0.0–100.0)

## 2017-04-14 MED ORDER — FUROSEMIDE 10 MG/ML IJ SOLN
80.0000 mg | Freq: Once | INTRAMUSCULAR | Status: AC
  Administered 2017-04-14: 80 mg via INTRAVENOUS
  Filled 2017-04-14: qty 8

## 2017-04-14 NOTE — ED Triage Notes (Signed)
Pt to ED via EMS from home with c/o SOB. Pt denies cough, fever. Pt 99% on RA, VS stable. Pt has extensive hx of cardiac arrest x3

## 2017-04-14 NOTE — ED Notes (Signed)
Pt returned from restroom and pulse ox was 85% on RA, pt refuses o2 , pt came up to 93%, MD made aware

## 2017-04-14 NOTE — ED Notes (Signed)
Pt ambulatory with pulse ox 90-94% on RA

## 2017-04-14 NOTE — ED Provider Notes (Signed)
[facility] Emergency Department Provider Note    First MD Initiated Contact with Patient 04/14/17 1051     (approximate)  I have reviewed the triage vital signs and the nursing notes.   HISTORY  Chief Complaint Shortness of Breath    HPI Andrea Rivers is a 61 y.o. female history of congestive heart failure CK D and COPD presents with worsening shortness of breath and 5 pound weight gain over the past week. Patient was seen in outpatient heart failure clinic and was told to increase her oral diuretics but has not had any improvement. He is becoming severely short of breath with exertion. Denies any chest pain. No cough. No fevers. Is having trouble speaking and anything more than a short sentence.   Past Medical History:  Diagnosis Date  . Anemia   . Anxiety   . CHF (congestive heart failure) ([place])   . Chronic kidney disease    INSUFFICIENCY  . COPD (chronic obstructive pulmonary disease) ([place])   . Diabetes mellitus without complication ([place])   . Dyspnea    DOE  . Dysrhythmia    A FIB  . Edema   . GERD (gastroesophageal reflux disease)   . History of kidney stones   . Hypertension   . Hypothyroidism    ABLATION  . Iron deficiency anemia 03/31/2017  . Neuropathy   . Orthopnea   . Sleep apnea    CPAP  . Vertigo   . Wheezing    Family History  Problem Relation Age of Onset  . COPD Mother   . Heart disease Mother   . Anemia Mother   . Heart disease Father   . COPD Father   . Anemia Sister   . Diabetes Maternal Grandmother   . Hypertension Paternal Grandfather    Past Surgical History:  Procedure Laterality Date  . CATARACT EXTRACTION W/PHACO Left 01/26/2017   Procedure: CATARACT EXTRACTION PHACO AND INTRAOCULAR LENS PLACEMENT (IOC);  Surgeon: Estill Cotta, MD;  Location: [facility] ORS;  Service: Ophthalmology;  Laterality: Left;  [place]   1:02.2 AP     23.7 CDE   28.92 fluid pack lot# [id number] H exp.05/08/2018  . CHOLECYSTECTOMY      . TONSILLECTOMY     Patient Active Problem List   Diagnosis Date Noted  . Microcytic anemia 03/31/2017  . Iron deficiency anemia 03/31/2017  . Chronic diastolic heart failure ([place]) 03/10/2017  . HTN (hypertension) 03/10/2017  . Diabetes ([place]) 03/10/2017  . Obstructive sleep apnea 03/10/2017  . Shock ([place])       Prior to Admission medications   Medication Sig Start Date End Date Taking? Authorizing Provider  albuterol (PROAIR HFA) 108 (90 Base) MCG/ACT inhaler inhale 2 puffs by mouth INTO LUNGS every 4 to 6 hours if needed for wheezing or shortness of breath 03/14/17   [provider]  amiodarone (PACERONE) 400 MG tablet Take 0.5 tablets (200 mg total) by mouth daily. 03/03/17   Gladstone Lighter, MD  Calcium Carbonate-Vitamin D3 (CALCIUM 600-D) 600-400 MG-UNIT TABS Take 1 tablet by mouth daily.    [provider]  Cholecalciferol (D3 HIGH POTENCY) 2000 units CAPS Take 1 capsule by mouth daily.    [provider]  citalopram (CELEXA) 20 MG tablet Take 20 mg by mouth daily.    [provider]  co-enzyme Q-10 50 MG capsule Take 50 mg by mouth daily.    [provider]  Garlic 9371 MG CAPS Take 1 capsule by mouth daily.  [provider]  glimepiride (AMARYL) 2 MG tablet Take 2 mg by mouth daily with breakfast.    [provider]  HUMALOG KWIKPEN 100 UNIT/ML KiwkPen Inject 40 Units into the skin 3 (three) times daily. 03/14/17   [provider]  insulin glargine (LANTUS) 100 UNIT/ML injection Inject 0.18 mLs (18 Units total) into the skin at bedtime. 03/03/17   Gladstone Lighter, MD  levothyroxine (SYNTHROID, LEVOTHROID) 175 MCG tablet Take 175 mcg by mouth daily before breakfast.    [provider]  linagliptin (TRADJENTA) 5 MG TABS tablet Take 5 mg by mouth daily.    [provider]  lovastatin (MEVACOR) 20 MG tablet Take 20 mg by mouth at bedtime.    [provider]  magnesium oxide (MAG-OX)  400 MG tablet Take 400 mg by mouth 2 (two) times daily.    [provider]  meclizine (ANTIVERT) 12.5 MG tablet Take 1 tablet (12.5 mg total) by mouth 3 (three) times daily as needed for dizziness. 03/03/17   Gladstone Lighter, MD  metoprolol tartrate (LOPRESSOR) 25 MG tablet Take 1 tablet (25 mg total) by mouth 2 (two) times daily. 03/03/17   Gladstone Lighter, MD  Omega-3 Fatty Acids (FISH OIL) 1000 MG CPDR Take 1 capsule by mouth daily.    [provider]  oxyCODONE (OXY IR/ROXICODONE) 5 MG immediate release tablet Take 1 tablet (5 mg total) by mouth every 4 (four) hours as needed for moderate pain. 03/03/17   Gladstone Lighter, MD  pantoprazole (PROTONIX) 40 MG tablet Take 40 mg by mouth 2 (two) times daily.    [provider]  torsemide (DEMADEX) 20 MG tablet Take 20 mg by mouth 2 (two) times daily. 03/28/17   [provider]  vitamin C (ASCORBIC ACID) 500 MG tablet Take 500 mg by mouth daily.    [provider]  warfarin (COUMADIN) 1 MG tablet Take 0.5 mg by mouth daily at 6 PM.    [provider]  warfarin (COUMADIN) 7.5 MG tablet Take 7.5 mg by mouth daily at 6 PM.    [provider]    Allergies Other    Social History Social History  Substance Use Topics  . Smoking status: Former Smoker    Packs/day: 3.00    Types: Cigarettes    Quit date: 15  . Smokeless tobacco: Never Used  . Alcohol use No    Review of Systems Patient denies headaches, rhinorrhea, blurry vision, numbness, shortness of breath, chest pain, edema, cough, abdominal pain, nausea, vomiting, diarrhea, dysuria, fevers, rashes or hallucinations unless otherwise stated above in HPI. ____________________________________________   PHYSICAL EXAM:  VITAL SIGNS: Vitals:   04/14/17 1218 04/14/17 1221  BP:    Pulse: 74 66  Resp: 19 (!) 21  Temp:    SpO2: (!) 84% 98%    Constitutional: Alert and oriented. Well appearing and in no acute  distress. Eyes: Conjunctivae are normal.  Head: Atraumatic. Nose: No congestion/rhinnorhea. Mouth/Throat: Mucous membranes are moist.   Neck: No stridor. Painless ROM.  Cardiovascular: Normal rate, regular rhythm. Grossly normal heart sounds.  Good peripheral circulation. Respiratory: mild tachypnea, speaking in short phrases, diminshed bibasilar breathsounds, inspiratory crackles Gastrointestinal: Soft and nontender. No distention. No abdominal bruits. No CVA tenderness. Musculoskeletal: No lower extremity tenderness, + BLE edema.  No joint effusions. Neurologic:  Normal speech and language. No gross focal neurologic deficits are appreciated. No facial droop Skin:  Skin is warm, dry and intact. No rash noted. Psychiatric: Mood and  affect are normal. Speech and behavior are normal.  ____________________________________________   LABS (all labs ordered are listed, but only abnormal results are displayed)  Results for orders placed or performed during the hospital encounter of 04/14/17 (from the past 24 hour(s))  CBC with Differential     Status: Abnormal   Collection Time: 04/14/17 11:00 AM  Result Value Ref Range   WBC 11.0 3.6 - 11.0 K/uL   RBC 4.17 3.80 - 5.20 MIL/uL   Hemoglobin 9.2 (L) 12.0 - 16.0 g/dL   HCT 30.0 (L) 35.0 - 47.0 %   MCV 72.0 (L) 80.0 - 100.0 fL   MCH 22.0 (L) 26.0 - 34.0 pg   MCHC 30.5 (L) 32.0 - 36.0 g/dL   RDW 22.4 (H) 11.5 - 14.5 %   Platelets 279 150 - 440 K/uL   Neutrophils Relative % 85 %   Neutro Abs 9.4 (H) 1.4 - 6.5 K/uL   Lymphocytes Relative 7 %   Lymphs Abs 0.8 (L) 1.0 - 3.6 K/uL   Monocytes Relative 6 %   Monocytes Absolute 0.7 0.2 - 0.9 K/uL   Eosinophils Relative 2 %   Eosinophils Absolute 0.2 0 - 0.7 K/uL   Basophils Relative 0 %   Basophils Absolute 0.0 0 - 0.1 K/uL  Brain natriuretic peptide     Status: Abnormal   Collection Time: 04/14/17 11:00 AM  Result Value Ref Range   B Natriuretic Peptide 413.0 (H) 0.0 - 100.0 pg/mL   Comprehensive metabolic panel     Status: Abnormal   Collection Time: 04/14/17 11:00 AM  Result Value Ref Range   Sodium 140 135 - 145 mmol/L   Potassium 4.2 3.5 - 5.1 mmol/L   Chloride 99 (L) 101 - 111 mmol/L   CO2 30 22 - 32 mmol/L   Glucose, Bld 146 (H) 65 - 99 mg/dL   BUN 23 (H) 6 - 20 mg/dL   Creatinine, Ser 1.66 (H) 0.44 - 1.00 mg/dL   Calcium 9.0 8.9 - 10.3 mg/dL   Total Protein 7.7 6.5 - 8.1 g/dL   Albumin 3.5 3.5 - 5.0 g/dL   AST 25 15 - 41 U/L   ALT 10 (L) 14 - 54 U/L   Alkaline Phosphatase 98 38 - 126 U/L   Total Bilirubin 0.6 0.3 - 1.2 mg/dL   GFR calc non Af Amer 32 (L) >60 mL/min   GFR calc Af Amer 37 (L) >60 mL/min   Anion gap 11 5 - 15  Protime-INR     Status: Abnormal   Collection Time: 04/14/17 11:00 AM  Result Value Ref Range   Prothrombin Time 26.8 (H) 11.4 - 15.2 seconds   INR 2.50   APTT     Status: Abnormal   Collection Time: 04/14/17 11:00 AM  Result Value Ref Range   aPTT 54 (H) 24 - 36 seconds  Urinalysis, Complete w Microscopic     Status: Abnormal   Collection Time: 04/14/17 11:00 AM  Result Value Ref Range   Color, Urine COLORLESS (A) YELLOW   APPearance CLEAR (A) CLEAR   Specific Gravity, Urine 1.005 1.005 - 1.030   pH 7.0 5.0 - 8.0   Glucose, UA NEGATIVE NEGATIVE mg/dL   Hgb urine dipstick NEGATIVE NEGATIVE   Bilirubin Urine NEGATIVE NEGATIVE   Ketones, ur NEGATIVE NEGATIVE mg/dL   Protein, ur NEGATIVE NEGATIVE mg/dL   Nitrite NEGATIVE NEGATIVE   Leukocytes, UA NEGATIVE NEGATIVE   RBC / HPF 0-5 0 - 5 RBC/hpf  WBC, UA 0-5 0 - 5 WBC/hpf   Bacteria, UA NONE SEEN NONE SEEN   Squamous Epithelial / LPF NONE SEEN NONE SEEN  Troponin I     Status: None   Collection Time: 04/14/17 11:00 AM  Result Value Ref Range   Troponin I <0.03 <0.03 ng/mL   ____________________________________________  EKG My review and personal interpretation at Time: 10:53   Indication: sob  Rate: 70  Rhythm: sinus Axis: normal Other: non specific st changes, no  significant changes from previous ____________________________________________  RADIOLOGY  I personally reviewed all radiographic images ordered to evaluate for the above acute complaints and reviewed radiology reports and findings.  These findings were personally discussed with the patient.  Please see medical record for radiology report.  ____________________________________________   PROCEDURES  Procedure(s) performed:  Procedures    Critical Care performed: no ____________________________________________   INITIAL IMPRESSION / ASSESSMENT AND PLAN / ED COURSE  Pertinent labs & imaging results that were available during my care of the patient were reviewed by me and considered in my medical decision making (see chart for details).  DDX: Asthma, copd, CHF, pna, ptx, malignancy, Pe, anemia   CASY TAVANO is a 61 y.o. who presents to the ED with shortness of breath as described above. Patient with recent complex past medical history but otherwise well appearing right now denies any chest pain. EKG shows nonspecific changes and her troponin is negative. Based on her physical exam and description of weight gain and I am suspicious that this is a component of acute on chronic congestive heart failure therefore we'll give dose of IV Lasix and reassess for improvement. Patient is appropriately anticoagulated so do not feel this is consistent with pulmonary embolism.  Less consistent with COPD.  The patient will be placed on continuous pulse oximetry and telemetry for monitoring.  Laboratory evaluation will be sent to evaluate for the above complaints.     Clinical Course as of Apr 14 1301  Thu Apr 14, 2017  1256 patient given a single dose of IV Lasix and is having significant urinary output since then. I have recommended observation in the hospital for further evaluation with the patient is declined this stating that she is feeling much better. I spoke with Dr. Ubaldo Glassing, her  cardiologist, who states that he's able to get her into clinic tomorrow morning I feel comfortable with this plan as he knows her well.  Have discussed with the patient and available family all diagnostics and treatments performed thus far and all questions were answered to the best of my ability. The patient demonstrates understanding and agreement with plan.   [PR]    Clinical Course User Index [PR] Merlyn Lot, MD     ____________________________________________   FINAL CLINICAL IMPRESSION(S) / ED DIAGNOSES  Final diagnoses:  Dyspnea on exertion  Acute on chronic congestive heart failure, unspecified heart failure type ([place])      NEW MEDICATIONS STARTED DURING THIS VISIT:  New Prescriptions   No medications on file     Note:  This document was prepared using Dragon voice recognition software and may include unintentional dictation errors.    Merlyn Lot, MD 04/14/17 [phone]

## 2017-04-15 ENCOUNTER — Inpatient Hospital Stay: Payer: Medicare Other

## 2017-04-15 VITALS — BP 124/76 | HR 61 | Temp 97.8°F | Resp 20

## 2017-04-15 DIAGNOSIS — D509 Iron deficiency anemia, unspecified: Secondary | ICD-10-CM

## 2017-04-15 MED ORDER — IRON SUCROSE 20 MG/ML IV SOLN
200.0000 mg | Freq: Once | INTRAVENOUS | Status: AC
  Administered 2017-04-15: 200 mg via INTRAVENOUS
  Filled 2017-04-15: qty 10

## 2017-04-15 MED ORDER — SODIUM CHLORIDE 0.9 % IV SOLN
Freq: Once | INTRAVENOUS | Status: AC
  Administered 2017-04-15: 14:00:00 via INTRAVENOUS
  Filled 2017-04-15: qty 1000

## 2017-04-15 MED ORDER — SODIUM CHLORIDE 0.9 % IV SOLN: 200.0000 mg | Freq: Once | INTRAVENOUS | Status: DC

## 2017-04-21 ENCOUNTER — Inpatient Hospital Stay: Payer: Medicare Other

## 2017-04-21 ENCOUNTER — Inpatient Hospital Stay: Payer: Medicare Other | Attending: Oncology | Admitting: Oncology

## 2017-04-21 ENCOUNTER — Encounter: Payer: Self-pay | Admitting: Oncology

## 2017-04-21 VITALS — BP 129/78 | HR 53 | Temp 97.8°F | Wt 296.5 lb

## 2017-04-21 DIAGNOSIS — D509 Iron deficiency anemia, unspecified: Secondary | ICD-10-CM | POA: Insufficient documentation

## 2017-04-21 DIAGNOSIS — E039 Hypothyroidism, unspecified: Secondary | ICD-10-CM | POA: Diagnosis not present

## 2017-04-21 DIAGNOSIS — D631 Anemia in chronic kidney disease: Secondary | ICD-10-CM | POA: Diagnosis not present

## 2017-04-21 DIAGNOSIS — Z794 Long term (current) use of insulin: Secondary | ICD-10-CM | POA: Diagnosis not present

## 2017-04-21 DIAGNOSIS — I4891 Unspecified atrial fibrillation: Secondary | ICD-10-CM | POA: Insufficient documentation

## 2017-04-21 DIAGNOSIS — Z79899 Other long term (current) drug therapy: Secondary | ICD-10-CM

## 2017-04-21 DIAGNOSIS — Z7901 Long term (current) use of anticoagulants: Secondary | ICD-10-CM | POA: Diagnosis not present

## 2017-04-21 DIAGNOSIS — E1122 Type 2 diabetes mellitus with diabetic chronic kidney disease: Secondary | ICD-10-CM | POA: Insufficient documentation

## 2017-04-21 DIAGNOSIS — I5032 Chronic diastolic (congestive) heart failure: Secondary | ICD-10-CM | POA: Insufficient documentation

## 2017-04-21 DIAGNOSIS — N183 Chronic kidney disease, stage 3 unspecified: Secondary | ICD-10-CM

## 2017-04-21 DIAGNOSIS — G4730 Sleep apnea, unspecified: Secondary | ICD-10-CM | POA: Diagnosis not present

## 2017-04-21 DIAGNOSIS — J449 Chronic obstructive pulmonary disease, unspecified: Secondary | ICD-10-CM | POA: Diagnosis not present

## 2017-04-21 DIAGNOSIS — Z87442 Personal history of urinary calculi: Secondary | ICD-10-CM | POA: Insufficient documentation

## 2017-04-21 DIAGNOSIS — Z87891 Personal history of nicotine dependence: Secondary | ICD-10-CM | POA: Insufficient documentation

## 2017-04-21 DIAGNOSIS — K219 Gastro-esophageal reflux disease without esophagitis: Secondary | ICD-10-CM | POA: Diagnosis not present

## 2017-04-21 DIAGNOSIS — I130 Hypertensive heart and chronic kidney disease with heart failure and stage 1 through stage 4 chronic kidney disease, or unspecified chronic kidney disease: Secondary | ICD-10-CM | POA: Diagnosis not present

## 2017-04-21 DIAGNOSIS — F419 Anxiety disorder, unspecified: Secondary | ICD-10-CM | POA: Diagnosis not present

## 2017-04-21 LAB — CBC WITH DIFFERENTIAL/PLATELET
Basophils Absolute: 0.1 10*3/uL (ref 0–0.1)
Basophils Relative: 1 %
EOS ABS: 0.3 10*3/uL (ref 0–0.7)
Eosinophils Relative: 3 %
HEMATOCRIT: 32.6 % — AB (ref 35.0–47.0)
HEMOGLOBIN: 9.9 g/dL — AB (ref 12.0–16.0)
LYMPHS ABS: 1.2 10*3/uL (ref 1.0–3.6)
LYMPHS PCT: 11 %
MCH: 22.4 pg — AB (ref 26.0–34.0)
MCHC: 30.4 g/dL — AB (ref 32.0–36.0)
MCV: 73.5 fL — AB (ref 80.0–100.0)
MONOS PCT: 10 %
Monocytes Absolute: 1 10*3/uL — ABNORMAL HIGH (ref 0.2–0.9)
NEUTROS PCT: 75 %
Neutro Abs: 8.2 10*3/uL — ABNORMAL HIGH (ref 1.4–6.5)
Platelets: 244 10*3/uL (ref 150–440)
RBC: 4.43 MIL/uL (ref 3.80–5.20)
RDW: 24.2 % — ABNORMAL HIGH (ref 11.5–14.5)
WBC: 10.8 10*3/uL (ref 3.6–11.0)

## 2017-04-21 LAB — FERRITIN: FERRITIN: 80 ng/mL (ref 11–307)

## 2017-04-21 LAB — IRON AND TIBC
IRON: 29 ug/dL (ref 28–170)
Saturation Ratios: 9 % — ABNORMAL LOW (ref 10.4–31.8)
TIBC: 330 ug/dL (ref 250–450)
UIBC: 301 ug/dL

## 2017-04-21 NOTE — Progress Notes (Signed)
Patient here today for follow up.  Patient c/o of mild headache and nausea with last IV iron.

## 2017-04-21 NOTE — Progress Notes (Signed)
Hematology/Oncology Follow up visit [facility] Telephone:(336) [phone] Fax:(336) [phone]  CONSULT NOTE Patient Care Team: Sofie Hartigan, MD as PCP - General (Family Medicine) Alisa Graff, FNP as Nurse Practitioner (Family Medicine) Ubaldo Glassing Javier Docker, MD as Consulting Physician (Cardiology) Elease Etienne, MD as Consulting Physician (Endocrinology)  CHIEF COMPLAINTS/PURPOSE OF CONSULTATION:  Anemia    HISTORY OF PRESENTING ILLNESS:  Andrea Rivers 61 y.o.  female with past medical history listed as below who was referred by Dr. Holley Raring to me for evaluation and management of anemia.  Patient was recently hospitalized due to acute respiratory failure secondary to diastolic CHF exacerbation. She was initially admitted to ICU, intubated and required mechanical ventilation. She also had acute on chronic kidney failure. Her recent labs showed microcytic anemia with hemoglobin  6.5, MCV 67.9. She received blood transfusion on 02/24/2017. Hemoglobin improved to 8.6.  Patient reports fatigue, and a lack of energy. She has some lower extremity edema. She lives at home by herself and is able to do her ADLs. She takes warfarin for A. Fib. Denies any bleeding events, blood in the stool or black stool.  INTERVAL HISTORY Patient presents to reevaluate of after IV iron treatment. Patient has received 4 dose of IV Venofer, she tolerates well. Her energy level has improved much, feels the same at baseline. She went to ER on 04/14/2017 for SOB due to volume overload and received IV lasix. She has appointment with cardiology this afternoon.    ROS:  Review of Systems  Constitutional: Positive for fatigue.  HENT:  Negative.   Eyes: Negative.   Respiratory: Positive for shortness of breath.   Cardiovascular: Negative.   Gastrointestinal: Negative.   Endocrine: Negative.   Genitourinary: Negative.    Musculoskeletal: Negative.   Skin: Negative.   Neurological: Negative.    Hematological: Negative.   Psychiatric/Behavioral: Negative.     MEDICAL HISTORY:  Past Medical History:  Diagnosis Date  . Anemia   . Anxiety   . CHF (congestive heart failure) ([place])   . Chronic kidney disease    INSUFFICIENCY  . COPD (chronic obstructive pulmonary disease) ([employer])   . Diabetes mellitus without complication ([place])   . Dyspnea    DOE  . Dysrhythmia    A FIB  . Edema   . GERD (gastroesophageal reflux disease)   . History of kidney stones   . Hypertension   . Hypothyroidism    ABLATION  . Iron deficiency anemia 03/31/2017  . Neuropathy   . Orthopnea   . Sleep apnea    CPAP  . Vertigo   . Wheezing     SURGICAL HISTORY: Past Surgical History:  Procedure Laterality Date  . CATARACT EXTRACTION W/PHACO Left 01/26/2017   Procedure: CATARACT EXTRACTION PHACO AND INTRAOCULAR LENS PLACEMENT (IOC);  Surgeon: Estill Cotta, MD;  Location: [facility] ORS;  Service: Ophthalmology;  Laterality: Left;  [place]   1:02.2 AP     23.7 CDE   28.92 fluid pack lot# [id number] H exp.05/08/2018  . CHOLECYSTECTOMY    . TONSILLECTOMY      SOCIAL HISTORY: Social History   Social History  . Marital status: Legally Separated    Spouse name: N/A  . Number of children: N/A  . Years of education: N/A   Occupational History  . Not on file.   Social History Main Topics  . Smoking status: Former Smoker    Packs/day: 3.00    Types: Cigarettes    Quit date: 29  . Smokeless  tobacco: Never Used  . Alcohol use No  . Drug use: No  . Sexual activity: Not on file   Other Topics Concern  . Not on file   Social History Narrative  . No narrative on file    FAMILY HISTORY: Family History  Problem Relation Age of Onset  . COPD Mother   . Heart disease Mother   . Anemia Mother   . Heart disease Father   . COPD Father   . Anemia Sister   . Diabetes Maternal Grandmother   . Hypertension Paternal Grandfather     ALLERGIES:  is allergic to other.  MEDICATIONS:  Current  Outpatient Prescriptions  Medication Sig Dispense Refill  . albuterol (PROAIR HFA) 108 (90 Base) MCG/ACT inhaler inhale 2 puffs by mouth INTO LUNGS every 4 to 6 hours if needed for wheezing or shortness of breath    . amiodarone (PACERONE) 400 MG tablet Take 0.5 tablets (200 mg total) by mouth daily. 30 tablet 2  . Calcium Carbonate-Vitamin D3 (CALCIUM 600-D) 600-400 MG-UNIT TABS Take 1 tablet by mouth daily.    . Cholecalciferol (D3 HIGH POTENCY) 2000 units CAPS Take 1 capsule by mouth daily.    . citalopram (CELEXA) 20 MG tablet Take 20 mg by mouth daily.    Marland Kitchen co-enzyme Q-10 50 MG capsule Take 50 mg by mouth daily.    . Garlic 2119 MG CAPS Take 1 capsule by mouth daily.    Marland Kitchen glimepiride (AMARYL) 2 MG tablet Take 2 mg by mouth daily with breakfast.    . HUMALOG KWIKPEN 100 UNIT/ML KiwkPen Inject 40 Units into the skin 3 (three) times daily.  0  . insulin glargine (LANTUS) 100 UNIT/ML injection Inject 0.18 mLs (18 Units total) into the skin at bedtime. 10 mL 11  . levothyroxine (SYNTHROID, LEVOTHROID) 175 MCG tablet Take 175 mcg by mouth daily before breakfast.    . linagliptin (TRADJENTA) 5 MG TABS tablet Take 5 mg by mouth daily.    Marland Kitchen lovastatin (MEVACOR) 20 MG tablet Take 20 mg by mouth at bedtime.    . magnesium oxide (MAG-OX) 400 MG tablet Take 400 mg by mouth 2 (two) times daily.    . meclizine (ANTIVERT) 12.5 MG tablet Take 1 tablet (12.5 mg total) by mouth 3 (three) times daily as needed for dizziness. 30 tablet 0  . metoprolol tartrate (LOPRESSOR) 25 MG tablet Take 1 tablet (25 mg total) by mouth 2 (two) times daily. 60 tablet 2  . Omega-3 Fatty Acids (FISH OIL) 1000 MG CPDR Take 1 capsule by mouth daily.    Marland Kitchen oxyCODONE (OXY IR/ROXICODONE) 5 MG immediate release tablet Take 1 tablet (5 mg total) by mouth every 4 (four) hours as needed for moderate pain. 20 tablet 0  . pantoprazole (PROTONIX) 40 MG tablet Take 40 mg by mouth 2 (two) times daily.    Marland Kitchen torsemide (DEMADEX) 20 MG tablet Take  20 mg by mouth 2 (two) times daily.    . vitamin C (ASCORBIC ACID) 500 MG tablet Take 500 mg by mouth daily.    Marland Kitchen warfarin (COUMADIN) 1 MG tablet Take 0.5 mg by mouth daily at 6 PM.    . warfarin (COUMADIN) 7.5 MG tablet Take 7.5 mg by mouth daily at 6 PM.     No current facility-administered medications for this visit.       Marland Kitchen  PHYSICAL EXAMINATION: ECOG PERFORMANCE STATUS: 2 - Symptomatic, <50% confined to bed Vitals:   04/21/17 0957  BP: 129/78  Pulse: (!) 53  Temp: 97.8 F (36.6 C)   Filed Weights   04/21/17 0957  Weight: 296 lb 8.3 oz (134.5 kg)    GENERAL:  Alert, no distress and comfortable. Morbid obese EYES: Conjunctivae pallor no icterus OROPHARYNX: no thrush or ulceration; good dentition  NECK: supple, no masses felt LYMPH:  no palpable lymphadenopathy in the cervical, axillary or inguinal regions LUNGS: clear to auscultation and  No wheeze or crackles HEART/CVS: regular rate & rhythm and no murmurs; +1 lower extremity edema ABDOMEN: abdomen soft, non-tender and normal bowel sounds Musculoskeletal:no cyanosis of digits and no clubbing  PSYCH: alert & oriented x 3  NEURO: no focal motor/sensory deficits SKIN:  no rashes or significant lesions. Marland Kitchen  LABORATORY DATA:  I have reviewed the data as listed Lab Results  Component Value Date   WBC 10.8 04/21/2017   HGB 9.9 (L) 04/21/2017   HCT 32.6 (L) 04/21/2017   MCV 73.5 (L) 04/21/2017   PLT 244 04/21/2017    Recent Labs  02/25/17 0501  03/03/17 0404 03/31/17 1035 04/14/17 1100  NA 141  < > 139 139 140  K 4.1  < > 3.9 4.3 4.2  CL 100*  < > 101 99* 99*  CO2 30  < > 30 33* 30  GLUCOSE 175*  < > 195* 132* 146*  BUN 53*  < > 41* 27* 23*  CREATININE 1.96*  < > 1.59* 1.50* 1.66*  CALCIUM 8.6*  < > 8.9 8.9 9.0  GFRNONAA 26*  < > 34* 36* 32*  GFRAA 31*  < > 39* 42* 37*  PROT 7.2  --   --  7.8 7.7  ALBUMIN 3.0*  --   --  3.6 3.5  AST 23  --   --  17 25  ALT 21  --   --  10* 10*  ALKPHOS 70  --   --  100  98  BILITOT 1.2  --   --  0.7 0.6  < > = values in this interval not displayed.   Labs obtained at the nephrologist office showed Peripheral blood Protein electrophoresis revealed no monoclonal spike. Urine protein electrophoresis negative for monoclonal spike. Hemoglobin 7.9, WBC 12.1, MCV 72, RDW 19, platelet 283,000, neutrophil 9.2, lymphocyte 1.6, monocyte 1.0, a ANA negative. CBC    Component Value Date/Time   WBC 10.8 04/21/2017 0928   RBC 4.43 04/21/2017 0928   HGB 9.9 (L) 04/21/2017 0928   HCT 32.6 (L) 04/21/2017 0928   PLT 244 04/21/2017 0928   MCV 73.5 (L) 04/21/2017 0928   MCH 22.4 (L) 04/21/2017 0928   MCHC 30.4 (L) 04/21/2017 0928   RDW 24.2 (H) 04/21/2017 0928   LYMPHSABS 1.2 04/21/2017 0928   MONOABS 1.0 (H) 04/21/2017 0928   EOSABS 0.3 04/21/2017 0928   BASOSABS 0.1 04/21/2017 0928   RADIOGRAPHIC STUDIES: I have personally reviewed the radiological images as listed and agreed with the findings in the report. Dg Chest 2 View  Result Date: 04/14/2017 CLINICAL DATA:  Shortness of breath. EXAM: CHEST  2 VIEW COMPARISON:  03/02/2017 FINDINGS: Bilateral diffuse interstitial thickening. No pleural effusion or pneumothorax. Stable cardiomegaly. No acute osseous abnormality. IMPRESSION: Findings concerning for mild CHF. Electronically Signed   By: Kathreen Devoid   On: 04/14/2017 12:46    ASSESSMENT & PLAN:  1. Microcytic anemia   2. Iron deficiency anemia, unspecified iron deficiency anemia type   3. CKD (chronic kidney disease) stage 3, GFR 30-59 ml/min  4. Hypothyroidism, unspecified type    Her anemia likely from chronic kidney disease, as well as iron deficiency.  Before giving procrit, need to replete her iron store. S/p IV vernofer  4 doses. Target Ferritin at least more than 300. Today's ferritin level is <100 Will give additional 4 doses of Venofer.  Follow-up was primary care physician regarding optimizing hypothyroidism management. All questions were answered.  The patient knows to call the clinic with any problems questions or concerns.  Return of visit: 4 weeks with cbc iron tibc ferritin done 1-2 days before visit. Possible procrit encounter.     Andrea Server, MD, PhD Hematology Oncology [facility] at [facility] Pager- [phone] 04/21/2017

## 2017-05-18 ENCOUNTER — Inpatient Hospital Stay: Payer: Medicare Other

## 2017-05-18 DIAGNOSIS — J449 Chronic obstructive pulmonary disease, unspecified: Secondary | ICD-10-CM | POA: Insufficient documentation

## 2017-05-18 DIAGNOSIS — Z87891 Personal history of nicotine dependence: Secondary | ICD-10-CM | POA: Diagnosis not present

## 2017-05-18 DIAGNOSIS — Z79899 Other long term (current) drug therapy: Secondary | ICD-10-CM | POA: Insufficient documentation

## 2017-05-18 DIAGNOSIS — I130 Hypertensive heart and chronic kidney disease with heart failure and stage 1 through stage 4 chronic kidney disease, or unspecified chronic kidney disease: Secondary | ICD-10-CM | POA: Diagnosis not present

## 2017-05-18 DIAGNOSIS — D631 Anemia in chronic kidney disease: Secondary | ICD-10-CM | POA: Diagnosis not present

## 2017-05-18 DIAGNOSIS — D509 Iron deficiency anemia, unspecified: Secondary | ICD-10-CM | POA: Insufficient documentation

## 2017-05-18 DIAGNOSIS — E039 Hypothyroidism, unspecified: Secondary | ICD-10-CM

## 2017-05-18 DIAGNOSIS — G4730 Sleep apnea, unspecified: Secondary | ICD-10-CM | POA: Insufficient documentation

## 2017-05-18 DIAGNOSIS — I5032 Chronic diastolic (congestive) heart failure: Secondary | ICD-10-CM | POA: Insufficient documentation

## 2017-05-18 DIAGNOSIS — E1122 Type 2 diabetes mellitus with diabetic chronic kidney disease: Secondary | ICD-10-CM | POA: Insufficient documentation

## 2017-05-18 DIAGNOSIS — I4891 Unspecified atrial fibrillation: Secondary | ICD-10-CM | POA: Diagnosis not present

## 2017-05-18 DIAGNOSIS — N183 Chronic kidney disease, stage 3 unspecified: Secondary | ICD-10-CM

## 2017-05-18 DIAGNOSIS — K219 Gastro-esophageal reflux disease without esophagitis: Secondary | ICD-10-CM | POA: Insufficient documentation

## 2017-05-18 DIAGNOSIS — Z7901 Long term (current) use of anticoagulants: Secondary | ICD-10-CM | POA: Diagnosis not present

## 2017-05-18 DIAGNOSIS — Z794 Long term (current) use of insulin: Secondary | ICD-10-CM | POA: Diagnosis not present

## 2017-05-18 DIAGNOSIS — N189 Chronic kidney disease, unspecified: Secondary | ICD-10-CM | POA: Diagnosis not present

## 2017-05-18 DIAGNOSIS — Z87442 Personal history of urinary calculi: Secondary | ICD-10-CM | POA: Insufficient documentation

## 2017-05-18 DIAGNOSIS — F419 Anxiety disorder, unspecified: Secondary | ICD-10-CM | POA: Insufficient documentation

## 2017-05-18 LAB — CBC WITH DIFFERENTIAL/PLATELET
Basophils Absolute: 0.1 10*3/uL (ref 0–0.1)
Basophils Relative: 1 %
Eosinophils Absolute: 0.2 10*3/uL (ref 0–0.7)
Eosinophils Relative: 2 %
HEMATOCRIT: 34.7 % — AB (ref 35.0–47.0)
HEMOGLOBIN: 10.7 g/dL — AB (ref 12.0–16.0)
LYMPHS ABS: 1.5 10*3/uL (ref 1.0–3.6)
Lymphocytes Relative: 11 %
MCH: 23 pg — AB (ref 26.0–34.0)
MCHC: 30.7 g/dL — AB (ref 32.0–36.0)
MCV: 74.9 fL — AB (ref 80.0–100.0)
Monocytes Absolute: 1.1 10*3/uL — ABNORMAL HIGH (ref 0.2–0.9)
Monocytes Relative: 8 %
Neutro Abs: 10.5 10*3/uL — ABNORMAL HIGH (ref 1.4–6.5)
Neutrophils Relative %: 78 %
Platelets: 279 10*3/uL (ref 150–440)
RBC: 4.64 MIL/uL (ref 3.80–5.20)
RDW: 22.9 % — ABNORMAL HIGH (ref 11.5–14.5)
WBC: 13.4 10*3/uL — ABNORMAL HIGH (ref 3.6–11.0)

## 2017-05-18 LAB — IRON AND TIBC
Iron: 24 ug/dL — ABNORMAL LOW (ref 28–170)
Saturation Ratios: 7 % — ABNORMAL LOW (ref 10.4–31.8)
TIBC: 332 ug/dL (ref 250–450)
UIBC: 308 ug/dL

## 2017-05-18 LAB — FERRITIN: Ferritin: 35 ng/mL (ref 11–307)

## 2017-05-19 ENCOUNTER — Encounter: Payer: Self-pay | Admitting: Oncology

## 2017-05-19 ENCOUNTER — Inpatient Hospital Stay: Payer: Medicare Other | Attending: Oncology | Admitting: Oncology

## 2017-05-19 ENCOUNTER — Inpatient Hospital Stay: Payer: Medicare Other

## 2017-05-19 VITALS — BP 98/64 | HR 44 | Temp 97.0°F | Wt 296.8 lb

## 2017-05-19 VITALS — BP 156/72 | HR 56 | Temp 96.8°F | Resp 20

## 2017-05-19 DIAGNOSIS — D509 Iron deficiency anemia, unspecified: Secondary | ICD-10-CM | POA: Diagnosis not present

## 2017-05-19 DIAGNOSIS — E039 Hypothyroidism, unspecified: Secondary | ICD-10-CM

## 2017-05-19 DIAGNOSIS — Z79899 Other long term (current) drug therapy: Secondary | ICD-10-CM

## 2017-05-19 DIAGNOSIS — I130 Hypertensive heart and chronic kidney disease with heart failure and stage 1 through stage 4 chronic kidney disease, or unspecified chronic kidney disease: Secondary | ICD-10-CM | POA: Diagnosis not present

## 2017-05-19 DIAGNOSIS — D631 Anemia in chronic kidney disease: Secondary | ICD-10-CM

## 2017-05-19 DIAGNOSIS — N189 Chronic kidney disease, unspecified: Secondary | ICD-10-CM | POA: Diagnosis not present

## 2017-05-19 LAB — URINALYSIS, COMPLETE (UACMP) WITH MICROSCOPIC
Bacteria, UA: NONE SEEN
Bilirubin Urine: NEGATIVE
GLUCOSE, UA: NEGATIVE mg/dL
HGB URINE DIPSTICK: NEGATIVE
Ketones, ur: NEGATIVE mg/dL
NITRITE: NEGATIVE
PROTEIN: NEGATIVE mg/dL
RBC / HPF: NONE SEEN RBC/hpf (ref 0–5)
SPECIFIC GRAVITY, URINE: 1.015 (ref 1.005–1.030)
pH: 7 (ref 5.0–8.0)

## 2017-05-19 MED ORDER — IRON SUCROSE 20 MG/ML IV SOLN
INTRAVENOUS | Status: AC
  Filled 2017-05-19: qty 10

## 2017-05-19 MED ORDER — SODIUM CHLORIDE 0.9 % IV SOLN
Freq: Once | INTRAVENOUS | Status: AC
  Administered 2017-05-19: 10:00:00 via INTRAVENOUS
  Filled 2017-05-19: qty 1000

## 2017-05-19 MED ORDER — SODIUM CHLORIDE 0.9% FLUSH
10.0000 mL | INTRAVENOUS | Status: DC | PRN
  Filled 2017-05-19: qty 10

## 2017-05-19 MED ORDER — IRON SUCROSE 20 MG/ML IV SOLN
200.0000 mg | Freq: Once | INTRAVENOUS | Status: AC
  Administered 2017-05-19: 200 mg via INTRAVENOUS
  Filled 2017-05-19: qty 10

## 2017-05-19 NOTE — Progress Notes (Signed)
Hematology/Oncology Follow up visit [facility] Telephone:(336) [phone] Fax:(336) [phone]  CONSULT NOTE Patient Care Team: Sofie Hartigan, MD as PCP - General (Family Medicine) Alisa Graff, FNP as Nurse Practitioner (Family Medicine) Ubaldo Glassing Javier Docker, MD as Consulting Physician (Cardiology) Elease Etienne, MD as Consulting Physician (Endocrinology)  CHIEF COMPLAINTS/REASON FOR VISIT Follow up for treatment of Anemia    HISTORY OF PRESENTING ILLNESS:  Andrea Rivers 61 y.o.  female with past medical history listed as below who was referred by Dr. Holley Raring to me for evaluation and management of anemia.  Patient was recently hospitalized due to acute respiratory failure secondary to diastolic CHF exacerbation. She was initially admitted to ICU, intubated and required mechanical ventilation. She also had acute on chronic kidney failure. Her recent labs showed microcytic anemia with hemoglobin  6.5, MCV 67.9. She received blood transfusion on 02/24/2017. Hemoglobin improved to 8.6. Patient reports fatigue, and a lack of energy. She has some lower extremity edema. She lives at home by herself and is able to do her ADLs. She takes warfarin for A. Fib. Denies any bleeding events, blood in the stool or black stool.  INTERVAL HISTORY Patient presents to reevaluate of after IV iron treatment. Patient has received 4 dose of IV Venofer so far. Plan from last visit was to give additional IV Venofer but due to scheduling miscommunication, she did get appointments and did not get infusion. she feels better today, with energy level has improved. SOB is at baseline. Denies chest pain, leg swelling.     Review of Systems - Oncology Constitutional: Negative for fever, night sweats,unintentional weight loss, change in appetite. HENT: Negative for ear pain, hearing loss, nasal bleeding Eyes: Negative for eye pain, double vision   Respiratory: Negative for wheezing, (+)  Chronic  shortness of breath, (+) chronic cough Cardiovascular: Negative for chest pain, palpitation.   Gastrointestinal: Negative abdominal pain, diarrhea, nausea vomiting Endocrine: Negative  Genitourinary: Negative for dysuria, hematuria, frequency Skin: Negative for rash, iching, bruising Neurological: Negative for headache, dizziness, seizure Hematological: Negative for easy bruising/bleeding, lymph node enlargement Psychiatric/Behavioral: Negative for depression, anxiety, suicidality MEDICAL HISTORY:  Past Medical History:  Diagnosis Date  . Anemia   . Anxiety   . CHF (congestive heart failure) ([place])   . Chronic kidney disease    INSUFFICIENCY  . COPD (chronic obstructive pulmonary disease) ([place])   . Diabetes mellitus without complication ([place])   . Dyspnea    DOE  . Dysrhythmia    A FIB  . Edema   . GERD (gastroesophageal reflux disease)   . History of kidney stones   . Hypertension   . Hypothyroidism    ABLATION  . Iron deficiency anemia 03/31/2017  . Neuropathy   . Orthopnea   . Sleep apnea    CPAP  . Vertigo   . Wheezing     SURGICAL HISTORY: Past Surgical History:  Procedure Laterality Date  . CATARACT EXTRACTION W/PHACO Left 01/26/2017   Procedure: CATARACT EXTRACTION PHACO AND INTRAOCULAR LENS PLACEMENT (IOC);  Surgeon: Estill Cotta, MD;  Location: [facility] ORS;  Service: Ophthalmology;  Laterality: Left;  [place]   1:02.2 AP     23.7 CDE   28.92 fluid pack lot# [id number] H exp.05/08/2018  . CHOLECYSTECTOMY    . TONSILLECTOMY      SOCIAL HISTORY: Social History   Social History  . Marital status: Legally Separated    Spouse name: N/A  . Number of children: N/A  . Years of education:  N/A   Occupational History  . Not on file.   Social History Main Topics  . Smoking status: Former Smoker    Packs/day: 3.00    Types: Cigarettes    Quit date: 50  . Smokeless tobacco: Never Used  . Alcohol use No  . Drug use: No  . Sexual activity: Not on file   Other  Topics Concern  . Not on file   Social History Narrative  . No narrative on file    FAMILY HISTORY: Family History  Problem Relation Age of Onset  . COPD Mother   . Heart disease Mother   . Anemia Mother   . Heart disease Father   . COPD Father   . Anemia Sister   . Diabetes Maternal Grandmother   . Hypertension Paternal Grandfather     ALLERGIES:  is allergic to other.  MEDICATIONS:  Current Outpatient Prescriptions  Medication Sig Dispense Refill  . albuterol (PROAIR HFA) 108 (90 Base) MCG/ACT inhaler inhale 2 puffs by mouth INTO LUNGS every 4 to 6 hours if needed for wheezing or shortness of breath    . amiodarone (PACERONE) 400 MG tablet Take 0.5 tablets (200 mg total) by mouth daily. 30 tablet 2  . Calcium Carbonate-Vitamin D3 (CALCIUM 600-D) 600-400 MG-UNIT TABS Take 1 tablet by mouth daily.    . Cholecalciferol (D3 HIGH POTENCY) 2000 units CAPS Take 1 capsule by mouth daily.    . citalopram (CELEXA) 20 MG tablet Take 20 mg by mouth daily.    Marland Kitchen co-enzyme Q-10 50 MG capsule Take 50 mg by mouth daily.    . Garlic 5361 MG CAPS Take 1 capsule by mouth daily.    Marland Kitchen glimepiride (AMARYL) 2 MG tablet Take 2 mg by mouth daily with breakfast.    . HUMALOG KWIKPEN 100 UNIT/ML KiwkPen Inject 40 Units into the skin 3 (three) times daily.  0  . insulin glargine (LANTUS) 100 UNIT/ML injection Inject 0.18 mLs (18 Units total) into the skin at bedtime. 10 mL 11  . levothyroxine (SYNTHROID, LEVOTHROID) 175 MCG tablet Take 175 mcg by mouth daily before breakfast.    . linagliptin (TRADJENTA) 5 MG TABS tablet Take 5 mg by mouth daily.    Marland Kitchen lovastatin (MEVACOR) 20 MG tablet Take 20 mg by mouth at bedtime.    . magnesium oxide (MAG-OX) 400 MG tablet Take 400 mg by mouth 2 (two) times daily.    . meclizine (ANTIVERT) 12.5 MG tablet Take 1 tablet (12.5 mg total) by mouth 3 (three) times daily as needed for dizziness. 30 tablet 0  . metolazone (ZAROXOLYN) 5 MG tablet Take 5 mg by mouth daily.      . metoprolol tartrate (LOPRESSOR) 25 MG tablet Take 1 tablet (25 mg total) by mouth 2 (two) times daily. 60 tablet 2  . Omega-3 Fatty Acids (FISH OIL) 1000 MG CPDR Take 1 capsule by mouth daily.    Marland Kitchen oxyCODONE (OXY IR/ROXICODONE) 5 MG immediate release tablet Take 1 tablet (5 mg total) by mouth every 4 (four) hours as needed for moderate pain. 20 tablet 0  . pantoprazole (PROTONIX) 40 MG tablet Take 40 mg by mouth 2 (two) times daily.    Marland Kitchen torsemide (DEMADEX) 20 MG tablet Take 20 mg by mouth 2 (two) times daily.    . vitamin C (ASCORBIC ACID) 500 MG tablet Take 500 mg by mouth daily.    Marland Kitchen warfarin (COUMADIN) 1 MG tablet Take 0.5 mg by mouth daily at 6 PM.    .  warfarin (COUMADIN) 7.5 MG tablet Take 7.5 mg by mouth daily at 6 PM.     No current facility-administered medications for this visit.       Marland Kitchen  PHYSICAL EXAMINATION: ECOG PERFORMANCE STATUS: 1 - Symptomatic but completely ambulatory There were no vitals filed for this visit. There were no vitals filed for this visit. GENERAL: No distress, well nourished.  SKIN:  No rashes or significant lesions  HEAD: Normocephalic, No masses, lesions, tenderness or abnormalities  EYES: Conjunctiva are pink, non icteric ENT: External ears normal ,lips , buccal mucosa, and tongue normal and mucous membranes are moist  LYMPH: No palpable cervical and axillary lymphadenopathy  LUNGS: Clear to auscultation, no crackles or wheezes HEART: Regular rate & rhythm, no murmurs, no gallops, S1 normal and S2 normal.trace lower extremity edema ABDOMEN: Abdomen soft, non-tender, normal bowel sounds, MUSCULOSKELETAL: No CVA tenderness and no tenderness on percussion of the back or rib cage.  EXTREMITIES: No edema, no skin discoloration or tenderness NEURO: Alert & oriented, no focal motor/sensory deficits.   LABORATORY DATA:  I have reviewed the data as listed Lab Results  Component Value Date   WBC 13.4 (H) 05/18/2017   HGB 10.7 (L) 05/18/2017   HCT  34.7 (L) 05/18/2017   MCV 74.9 (L) 05/18/2017   PLT 279 05/18/2017    Recent Labs  02/25/17 0501  03/03/17 0404 03/31/17 1035 04/14/17 1100  NA 141  < > 139 139 140  K 4.1  < > 3.9 4.3 4.2  CL 100*  < > 101 99* 99*  CO2 30  < > 30 33* 30  GLUCOSE 175*  < > 195* 132* 146*  BUN 53*  < > 41* 27* 23*  CREATININE 1.96*  < > 1.59* 1.50* 1.66*  CALCIUM 8.6*  < > 8.9 8.9 9.0  GFRNONAA 26*  < > 34* 36* 32*  GFRAA 31*  < > 39* 42* 37*  PROT 7.2  --   --  7.8 7.7  ALBUMIN 3.0*  --   --  3.6 3.5  AST 23  --   --  17 25  ALT 21  --   --  10* 10*  ALKPHOS 70  --   --  100 98  BILITOT 1.2  --   --  0.7 0.6  < > = values in this interval not displayed.   Labs obtained at the nephrologist office showed Peripheral blood Protein electrophoresis revealed no monoclonal spike. Urine protein electrophoresis negative for monoclonal spike. Hemoglobin 7.9, WBC 12.1, MCV 72, RDW 19, platelet 283,000, neutrophil 9.2, lymphocyte 1.6, monocyte 1.0, a ANA negative. CBC    Component Value Date/Time   WBC 13.4 (H) 05/18/2017 1449   RBC 4.64 05/18/2017 1449   HGB 10.7 (L) 05/18/2017 1449   HCT 34.7 (L) 05/18/2017 1449   PLT 279 05/18/2017 1449   MCV 74.9 (L) 05/18/2017 1449   MCH 23.0 (L) 05/18/2017 1449   MCHC 30.7 (L) 05/18/2017 1449   RDW 22.9 (H) 05/18/2017 1449   LYMPHSABS 1.5 05/18/2017 1449   MONOABS 1.1 (H) 05/18/2017 1449   EOSABS 0.2 05/18/2017 1449   BASOSABS 0.1 05/18/2017 1449   ASSESSMENT & PLAN:  1. Iron deficiency anemia, unspecified iron deficiency anemia type   2. Microcytic anemia    Her anemia likely multifactorial from chronic kidney disease, as well as iron deficiency.  Before giving procrit, need to replete her iron store. S/p IV vernofer x 4 doses. Target Ferritin at least more  than 300. Today's ferritin level is <100, also decreased from a week ago. I suspect ongoing blood loss. Stool occult was done previously and was negative. She has never had colonoscopy done and is  not interested having one.  Obtain UA to rule out blood loss from GU tract.  Hemoglobin improved, above 10. No need for procrit at this point.  Will give additional 3 doses of Venofer, including one dose today.  Follow-up was primary care physician regarding optimizing hypothyroidism management. All questions were answered. The patient knows to call the clinic with any problems questions or concerns.  Return of visit: 5 weeks with cbc iron tibc ferritin done 1-2 days before visit.     Earlie Server, MD, PhD Hematology Oncology [facility] at [facility] Pager- [phone] 05/19/2017

## 2017-05-19 NOTE — Progress Notes (Signed)
Patient here today for follow up on anemia.

## 2017-05-27 ENCOUNTER — Inpatient Hospital Stay: Payer: Medicare Other

## 2017-05-27 VITALS — BP 131/72 | HR 57 | Temp 96.2°F | Resp 20

## 2017-05-27 DIAGNOSIS — D509 Iron deficiency anemia, unspecified: Secondary | ICD-10-CM | POA: Diagnosis not present

## 2017-05-27 MED ORDER — IRON SUCROSE 20 MG/ML IV SOLN
200.0000 mg | Freq: Once | INTRAVENOUS | Status: AC
  Administered 2017-05-27: 200 mg via INTRAVENOUS
  Filled 2017-05-27: qty 10

## 2017-05-27 MED ORDER — IRON SUCROSE 20 MG/ML IV SOLN
INTRAVENOUS | Status: AC
  Filled 2017-05-27: qty 10

## 2017-05-27 MED ORDER — SODIUM CHLORIDE 0.9% FLUSH
10.0000 mL | INTRAVENOUS | Status: DC | PRN
  Filled 2017-05-27: qty 10

## 2017-05-27 MED ORDER — SODIUM CHLORIDE 0.9 % IV SOLN
Freq: Once | INTRAVENOUS | Status: AC
  Administered 2017-05-27: 15:00:00 via INTRAVENOUS
  Filled 2017-05-27: qty 1000

## 2017-05-27 NOTE — Patient Instructions (Signed)

## 2017-06-03 ENCOUNTER — Inpatient Hospital Stay: Payer: Medicare Other

## 2017-06-03 VITALS — BP 138/75 | HR 61 | Temp 96.7°F | Resp 20

## 2017-06-03 DIAGNOSIS — D509 Iron deficiency anemia, unspecified: Secondary | ICD-10-CM | POA: Diagnosis not present

## 2017-06-03 MED ORDER — IRON SUCROSE 20 MG/ML IV SOLN
200.0000 mg | Freq: Once | INTRAVENOUS | Status: AC
  Administered 2017-06-03: 200 mg via INTRAVENOUS
  Filled 2017-06-03: qty 10

## 2017-06-03 MED ORDER — SODIUM CHLORIDE 0.9 % IV SOLN
Freq: Once | INTRAVENOUS | Status: AC
Start: 2017-06-03 — End: 2017-06-03
  Administered 2017-06-03: 15:00:00 via INTRAVENOUS
  Filled 2017-06-03: qty 1000

## 2017-06-03 MED ORDER — SODIUM CHLORIDE 0.9% FLUSH
10.0000 mL | INTRAVENOUS | Status: DC | PRN
  Filled 2017-06-03: qty 10

## 2017-06-03 MED ORDER — IRON SUCROSE 20 MG/ML IV SOLN
INTRAVENOUS | Status: AC
  Filled 2017-06-03: qty 10

## 2017-06-03 NOTE — Patient Instructions (Signed)
Iron Sucrose injection What is this medicine? IRON SUCROSE (AHY ern SOO krohs) is an iron complex. Iron is used to make healthy red blood cells, which carry oxygen and nutrients throughout the body. This medicine is used to treat iron deficiency anemia in people with chronic kidney disease. This medicine may be used for other purposes; ask your health care provider or pharmacist if you have questions. COMMON BRAND NAME(S): Venofer What should I tell my health care provider before I take this medicine? They need to know if you have any of these conditions: -anemia not caused by low iron levels -heart disease -high levels of iron in the blood -kidney disease -liver disease -an unusual or allergic reaction to iron, other medicines, foods, dyes, or preservatives -pregnant or trying to get pregnant -breast-feeding How should I use this medicine? This medicine is for infusion into a vein. It is given by a health care professional in a hospital or clinic setting. Talk to your pediatrician regarding the use of this medicine in children. While this drug may be prescribed for children as young as 2 years for selected conditions, precautions do apply. Overdosage: If you think you have taken too much of this medicine contact a poison control center or emergency room at once. NOTE: This medicine is only for you. Do not share this medicine with others. What if I miss a dose? It is important not to miss your dose. Call your doctor or health care professional if you are unable to keep an appointment. What may interact with this medicine? Do not take this medicine with any of the following medications: -deferoxamine -dimercaprol -other iron products This medicine may also interact with the following medications: -chloramphenicol -deferasirox This list may not describe all possible interactions. Give your health care provider a list of all the medicines, herbs, non-prescription drugs, or dietary  supplements you use. Also tell them if you smoke, drink alcohol, or use illegal drugs. Some items may interact with your medicine. What should I watch for while using this medicine? Visit your doctor or healthcare professional regularly. Tell your doctor or healthcare professional if your symptoms do not start to get better or if they get worse. You may need blood work done while you are taking this medicine. You may need to follow a special diet. Talk to your doctor. Foods that contain iron include: whole grains/cereals, dried fruits, beans, or peas, leafy green vegetables, and organ meats (liver, kidney). What side effects may I notice from receiving this medicine? Side effects that you should report to your doctor or health care professional as soon as possible: -allergic reactions like skin rash, itching or hives, swelling of the face, lips, or tongue -breathing problems -changes in blood pressure -cough -fast, irregular heartbeat -feeling faint or lightheaded, falls -fever or chills -flushing, sweating, or hot feelings -joint or muscle aches/pains -seizures -swelling of the ankles or feet -unusually weak or tired Side effects that usually do not require medical attention (report to your doctor or health care professional if they continue or are bothersome): -diarrhea -feeling achy -headache -irritation at site where injected -nausea, vomiting -stomach upset -tiredness This list may not describe all possible side effects. Call your doctor for medical advice about side effects. You may report side effects to FDA at 1-800-FDA-1088. Where should I keep my medicine? This drug is given in a hospital or clinic and will not be stored at home. NOTE: This sheet is a summary. It may not cover all possible information. If   you have questions about this medicine, talk to your doctor, pharmacist, or health care provider.  2018 Elsevier/Gold Standard (2011-05-06 17:14:35)  

## 2017-06-22 ENCOUNTER — Inpatient Hospital Stay: Payer: Medicare Other | Attending: Oncology

## 2017-06-22 DIAGNOSIS — Z87891 Personal history of nicotine dependence: Secondary | ICD-10-CM | POA: Diagnosis not present

## 2017-06-22 DIAGNOSIS — Z87442 Personal history of urinary calculi: Secondary | ICD-10-CM | POA: Insufficient documentation

## 2017-06-22 DIAGNOSIS — N183 Chronic kidney disease, stage 3 (moderate): Secondary | ICD-10-CM | POA: Insufficient documentation

## 2017-06-22 DIAGNOSIS — I4891 Unspecified atrial fibrillation: Secondary | ICD-10-CM | POA: Diagnosis not present

## 2017-06-22 DIAGNOSIS — F419 Anxiety disorder, unspecified: Secondary | ICD-10-CM | POA: Diagnosis not present

## 2017-06-22 DIAGNOSIS — Z7901 Long term (current) use of anticoagulants: Secondary | ICD-10-CM | POA: Insufficient documentation

## 2017-06-22 DIAGNOSIS — G4730 Sleep apnea, unspecified: Secondary | ICD-10-CM | POA: Diagnosis not present

## 2017-06-22 DIAGNOSIS — D509 Iron deficiency anemia, unspecified: Secondary | ICD-10-CM | POA: Insufficient documentation

## 2017-06-22 DIAGNOSIS — D631 Anemia in chronic kidney disease: Secondary | ICD-10-CM | POA: Diagnosis not present

## 2017-06-22 DIAGNOSIS — Z79899 Other long term (current) drug therapy: Secondary | ICD-10-CM | POA: Insufficient documentation

## 2017-06-22 DIAGNOSIS — I130 Hypertensive heart and chronic kidney disease with heart failure and stage 1 through stage 4 chronic kidney disease, or unspecified chronic kidney disease: Secondary | ICD-10-CM | POA: Insufficient documentation

## 2017-06-22 DIAGNOSIS — J449 Chronic obstructive pulmonary disease, unspecified: Secondary | ICD-10-CM | POA: Insufficient documentation

## 2017-06-22 DIAGNOSIS — I5032 Chronic diastolic (congestive) heart failure: Secondary | ICD-10-CM | POA: Diagnosis not present

## 2017-06-22 DIAGNOSIS — E039 Hypothyroidism, unspecified: Secondary | ICD-10-CM | POA: Insufficient documentation

## 2017-06-22 DIAGNOSIS — Z794 Long term (current) use of insulin: Secondary | ICD-10-CM | POA: Insufficient documentation

## 2017-06-22 DIAGNOSIS — E1122 Type 2 diabetes mellitus with diabetic chronic kidney disease: Secondary | ICD-10-CM | POA: Insufficient documentation

## 2017-06-22 DIAGNOSIS — K219 Gastro-esophageal reflux disease without esophagitis: Secondary | ICD-10-CM | POA: Diagnosis not present

## 2017-06-22 LAB — CBC WITH DIFFERENTIAL/PLATELET
BASOS PCT: 1 %
Basophils Absolute: 0.1 10*3/uL (ref 0–0.1)
EOS ABS: 0.2 10*3/uL (ref 0–0.7)
Eosinophils Relative: 1 %
HEMATOCRIT: 36 % (ref 35.0–47.0)
HEMOGLOBIN: 11.3 g/dL — AB (ref 12.0–16.0)
LYMPHS ABS: 1.4 10*3/uL (ref 1.0–3.6)
Lymphocytes Relative: 12 %
MCH: 24.5 pg — ABNORMAL LOW (ref 26.0–34.0)
MCHC: 31.5 g/dL — ABNORMAL LOW (ref 32.0–36.0)
MCV: 77.6 fL — ABNORMAL LOW (ref 80.0–100.0)
Monocytes Absolute: 1.1 10*3/uL — ABNORMAL HIGH (ref 0.2–0.9)
Monocytes Relative: 9 %
NEUTROS ABS: 8.8 10*3/uL — AB (ref 1.4–6.5)
NEUTROS PCT: 77 %
Platelets: 213 10*3/uL (ref 150–440)
RBC: 4.63 MIL/uL (ref 3.80–5.20)
RDW: 23.3 % — ABNORMAL HIGH (ref 11.5–14.5)
WBC: 11.5 10*3/uL — AB (ref 3.6–11.0)

## 2017-06-22 LAB — IRON AND TIBC
Iron: 33 ug/dL (ref 28–170)
SATURATION RATIOS: 13 % (ref 10.4–31.8)
TIBC: 260 ug/dL (ref 250–450)
UIBC: 227 ug/dL

## 2017-06-22 LAB — FERRITIN: Ferritin: 117 ng/mL (ref 11–307)

## 2017-06-23 ENCOUNTER — Inpatient Hospital Stay: Payer: Medicare Other

## 2017-06-23 ENCOUNTER — Encounter: Payer: Self-pay | Admitting: Oncology

## 2017-06-23 ENCOUNTER — Other Ambulatory Visit: Payer: Self-pay

## 2017-06-23 ENCOUNTER — Inpatient Hospital Stay (HOSPITAL_BASED_OUTPATIENT_CLINIC_OR_DEPARTMENT_OTHER): Payer: Medicare Other | Admitting: Oncology

## 2017-06-23 VITALS — BP 174/73 | HR 56 | Temp 97.7°F | Wt 303.7 lb

## 2017-06-23 DIAGNOSIS — D509 Iron deficiency anemia, unspecified: Secondary | ICD-10-CM

## 2017-06-23 DIAGNOSIS — Z79899 Other long term (current) drug therapy: Secondary | ICD-10-CM | POA: Diagnosis not present

## 2017-06-23 DIAGNOSIS — D631 Anemia in chronic kidney disease: Secondary | ICD-10-CM

## 2017-06-23 DIAGNOSIS — E039 Hypothyroidism, unspecified: Secondary | ICD-10-CM

## 2017-06-23 DIAGNOSIS — N183 Chronic kidney disease, stage 3 unspecified: Secondary | ICD-10-CM

## 2017-06-23 DIAGNOSIS — I130 Hypertensive heart and chronic kidney disease with heart failure and stage 1 through stage 4 chronic kidney disease, or unspecified chronic kidney disease: Secondary | ICD-10-CM | POA: Diagnosis not present

## 2017-06-23 NOTE — Progress Notes (Signed)
Hematology/Oncology Follow up visit [facility] Telephone:(336) [phone] Fax:(336) [phone]   Patient Care Team: Sofie Hartigan, MD as PCP - General (Family Medicine) Alisa Graff, FNP as Nurse Practitioner (Family Medicine) Ubaldo Glassing Javier Docker, MD as Consulting Physician (Cardiology) Elease Etienne, MD as Consulting Physician (Endocrinology)  CHIEF COMPLAINTS/REASON FOR VISIT Follow up for treatment of Anemia    HISTORY OF PRESENTING ILLNESS:  Andrea Rivers 61 y.o.  female with past medical history listed as below who was referred by Dr. Holley Raring to me for evaluation and management of anemia.  Patient was recently hospitalized due to acute respiratory failure secondary to diastolic CHF exacerbation. She was initially admitted to ICU, intubated and required mechanical ventilation. She also had acute on chronic kidney failure. Her recent labs showed microcytic anemia with hemoglobin  6.5, MCV 67.9. She received blood transfusion on 02/24/2017. Hemoglobin improved to 8.6. Patient reports fatigue, and a lack of energy. She has some lower extremity edema. She lives at home by herself and is able to do her ADLs. She takes warfarin for A. Fib. Denies any bleeding events, blood in the stool or black stool.  INTERVAL HISTORY  Patient presents for follow up for management of anemia. She had got IV Venofer infusion. She feels well except recently had an episode of bronchitis.  Fatigue level is slightly better.   Review of Systems  Constitutional: Negative for appetite change and chills.  HENT:   Negative for hearing loss.   Eyes: Negative for eye problems.  Respiratory: Positive for cough and shortness of breath. Negative for chest tightness.   Cardiovascular: Negative for chest pain.  Gastrointestinal: Negative for abdominal distention.  Endocrine: Negative for hot flashes.  Genitourinary: Negative for dysuria and frequency.   Musculoskeletal: Negative for  arthralgias.  Skin: Negative for itching.  Neurological: Negative for dizziness.  Hematological: Negative for adenopathy.  Psychiatric/Behavioral: The patient is not nervous/anxious.    MEDICAL HISTORY:  Past Medical History:  Diagnosis Date  . Anemia   . Anxiety   . CHF (congestive heart failure) ([place])   . Chronic kidney disease    INSUFFICIENCY  . COPD (chronic obstructive pulmonary disease) ([place])   . Diabetes mellitus without complication ([place])   . Dyspnea    DOE  . Dysrhythmia    A FIB  . Edema   . GERD (gastroesophageal reflux disease)   . History of kidney stones   . Hypertension   . Hypothyroidism    ABLATION  . Iron deficiency anemia 03/31/2017  . Neuropathy   . Orthopnea   . Sleep apnea    CPAP  . Vertigo   . Wheezing     SURGICAL HISTORY: Past Surgical History:  Procedure Laterality Date  . CATARACT EXTRACTION W/PHACO Left 01/26/2017   Procedure: CATARACT EXTRACTION PHACO AND INTRAOCULAR LENS PLACEMENT (IOC);  Surgeon: Estill Cotta, MD;  Location: [facility] ORS;  Service: Ophthalmology;  Laterality: Left;  [place]   1:02.2 AP     23.7 CDE   28.92 fluid pack lot# [id number] H exp.05/08/2018  . CHOLECYSTECTOMY    . TONSILLECTOMY      SOCIAL HISTORY: Social History   Socioeconomic History  . Marital status: Legally Separated    Spouse name: Not on file  . Number of children: Not on file  . Years of education: Not on file  . Highest education level: Not on file  Social Needs  . Financial resource strain: Not on file  . Food insecurity - worry: Not  on file  . Food insecurity - inability: Not on file  . Transportation needs - medical: Not on file  . Transportation needs - non-medical: Not on file  Occupational History  . Not on file  Tobacco Use  . Smoking status: Former Smoker    Packs/day: 3.00    Types: Cigarettes    Last attempt to quit: 1988    Years since quitting: 30.8  . Smokeless tobacco: Never Used  Substance and Sexual Activity  . Alcohol  use: No  . Drug use: No  . Sexual activity: Not on file  Other Topics Concern  . Not on file  Social History Narrative  . Not on file    FAMILY HISTORY: Family History  Problem Relation Age of Onset  . COPD Mother   . Heart disease Mother   . Anemia Mother   . Heart disease Father   . COPD Father   . Anemia Sister   . Diabetes Maternal Grandmother   . Hypertension Paternal Grandfather     ALLERGIES:  is allergic to other.  MEDICATIONS:  Current Outpatient Medications  Medication Sig Dispense Refill  . albuterol (PROAIR HFA) 108 (90 Base) MCG/ACT inhaler inhale 2 puffs by mouth INTO LUNGS every 4 to 6 hours if needed for wheezing or shortness of breath    . amiodarone (PACERONE) 200 MG tablet Take 200 mg by mouth daily.  0  . Calcium Carbonate-Vitamin D3 (CALCIUM 600-D) 600-400 MG-UNIT TABS Take 1 tablet by mouth daily.    . Cholecalciferol (D3 HIGH POTENCY) 2000 units CAPS Take 1 capsule by mouth daily.    . citalopram (CELEXA) 20 MG tablet Take 20 mg by mouth daily.    Marland Kitchen co-enzyme Q-10 50 MG capsule Take 50 mg by mouth daily.    . Garlic 6010 MG CAPS Take 1 capsule by mouth daily.    Marland Kitchen glimepiride (AMARYL) 2 MG tablet Take 2 mg by mouth daily with breakfast.    . HUMALOG KWIKPEN 100 UNIT/ML KiwkPen Inject 40 Units into the skin 3 (three) times daily.  0  . insulin glargine (LANTUS) 100 UNIT/ML injection Inject 0.18 mLs (18 Units total) into the skin at bedtime. 10 mL 11  . levothyroxine (SYNTHROID, LEVOTHROID) 175 MCG tablet Take 175 mcg by mouth daily before breakfast.    . linagliptin (TRADJENTA) 5 MG TABS tablet Take 5 mg by mouth daily.    Marland Kitchen lovastatin (MEVACOR) 20 MG tablet Take 20 mg by mouth at bedtime.    . magnesium oxide (MAG-OX) 400 MG tablet Take 400 mg by mouth 2 (two) times daily.    . meclizine (ANTIVERT) 12.5 MG tablet Take 1 tablet (12.5 mg total) by mouth 3 (three) times daily as needed for dizziness. 30 tablet 0  . metolazone (ZAROXOLYN) 5 MG tablet Take  5 mg by mouth daily.    . metoprolol tartrate (LOPRESSOR) 25 MG tablet Take 1 tablet (25 mg total) by mouth 2 (two) times daily. 60 tablet 2  . Omega-3 Fatty Acids (FISH OIL) 1000 MG CPDR Take 1 capsule by mouth daily.    Marland Kitchen oxyCODONE (OXY IR/ROXICODONE) 5 MG immediate release tablet Take 1 tablet (5 mg total) by mouth every 4 (four) hours as needed for moderate pain. 20 tablet 0  . pantoprazole (PROTONIX) 40 MG tablet Take 40 mg by mouth 2 (two) times daily.    Marland Kitchen torsemide (DEMADEX) 20 MG tablet Take 20 mg by mouth 2 (two) times daily.    . vitamin  C (ASCORBIC ACID) 500 MG tablet Take 500 mg by mouth daily.    Marland Kitchen warfarin (COUMADIN) 1 MG tablet Take 0.5 mg by mouth daily at 6 PM.    . warfarin (COUMADIN) 5 MG tablet Take 5 mg by mouth daily.  0   No current facility-administered medications for this visit.       Marland Kitchen  PHYSICAL EXAMINATION: ECOG PERFORMANCE STATUS: 1 - Symptomatic but completely ambulatory Vitals:   06/23/17 1118  BP: (!) 174/73  Pulse: (!) 56  Temp: 97.7 F (36.5 C)   Filed Weights   06/23/17 1118  Weight: (!) 303 lb 10.9 oz (137.8 kg)   GENERAL: No distress, well nourished. Morbid obesity SKIN:  No rashes or significant lesions  HEAD: Normocephalic, No masses, lesions, tenderness or abnormalities  EYES: Conjunctiva are pink, non icteric ENT: External ears normal ,lips , buccal mucosa, and tongue normal and mucous membranes are moist  LYMPH: No palpable cervical and axillary lymphadenopathy  LUNGS: Clear to auscultation, no crackles or wheezes HEART: Regular rate & rhythm, no murmurs, no gallops, S1 normal and S2 normal  ABDOMEN: Abdomen soft, non-tender, normal bowel sounds,  MUSCULOSKELETAL: No CVA tenderness and no tenderness on percussion of the back or rib cage.  EXTREMITIES: No edema, no skin discoloration or tenderness NEURO: Alert & oriented, no focal motor/sensory deficits.   LABORATORY DATA:  I have reviewed the data as listed Lab Results    Component Value Date   WBC 11.5 (H) 06/22/2017   HGB 11.3 (L) 06/22/2017   HCT 36.0 06/22/2017   MCV 77.6 (L) 06/22/2017   PLT 213 06/22/2017   Recent Labs    02/25/17 0501  03/03/17 0404 03/31/17 1035 04/14/17 1100  NA 141   < > 139 139 140  K 4.1   < > 3.9 4.3 4.2  CL 100*   < > 101 99* 99*  CO2 30   < > 30 33* 30  GLUCOSE 175*   < > 195* 132* 146*  BUN 53*   < > 41* 27* 23*  CREATININE 1.96*   < > 1.59* 1.50* 1.66*  CALCIUM 8.6*   < > 8.9 8.9 9.0  GFRNONAA 26*   < > 34* 36* 32*  GFRAA 31*   < > 39* 42* 37*  PROT 7.2  --   --  7.8 7.7  ALBUMIN 3.0*  --   --  3.6 3.5  AST 23  --   --  17 25  ALT 21  --   --  10* 10*  ALKPHOS 70  --   --  100 98  BILITOT 1.2  --   --  0.7 0.6   < > = values in this interval not displayed.     Labs obtained at the nephrologist office showed Peripheral blood Protein electrophoresis revealed no monoclonal spike. Urine protein electrophoresis negative for monoclonal spike. Hemoglobin 7.9, WBC 12.1, MCV 72, RDW 19, platelet 283,000, neutrophil 9.2, lymphocyte 1.6, monocyte 1.0, a ANA negative. CBC    Component Value Date/Time   WBC 11.5 (H) 06/22/2017 1001   RBC 4.63 06/22/2017 1001   HGB 11.3 (L) 06/22/2017 1001   HCT 36.0 06/22/2017 1001   PLT 213 06/22/2017 1001   MCV 77.6 (L) 06/22/2017 1001   MCH 24.5 (L) 06/22/2017 1001   MCHC 31.5 (L) 06/22/2017 1001   RDW 23.3 (H) 06/22/2017 1001   LYMPHSABS 1.4 06/22/2017 1001   MONOABS 1.1 (H) 06/22/2017 1001   EOSABS  0.2 06/22/2017 1001   BASOSABS 0.1 06/22/2017 1001   ASSESSMENT & PLAN:  1. Iron deficiency anemia, unspecified iron deficiency anemia type   2. CKD (chronic kidney disease) stage 3, GFR 30-59 ml/min (HCC)   3. Microcytic anemia   4. Anemia of chronic kidney failure, stage 3 (moderate) (HCC)    Her anemia likely multifactorial from chronic kidney disease, as well as iron deficiency.  Ferritin improved after IV venofer infusion. She is on warfarin.  I suspect ongoing  blood loss. Stool occult was done previously and was negative. She has never had colonoscopy done and declined to be referred to GI for colonoscopy.   UA is negative for blood loss. Hemoglobin improved, above 10. No need for procrit at this point.  Blood pressure has trending high. Will hold additional IV iron infusion for now. Advise patient to follow up with cardiologist Dr.Fath for tighter blood pressure control.   Follow-up was primary care physician regarding optimizing hypothyroidism management. Repeat TSH next visit.  All questions were answered. The patient knows to call the clinic with any problems questions or concerns.  Return of visit: 4 weeks with cbc iron tibc ferritin done 1-2 days before visit.     Earlie Server, MD, PhD Hematology Oncology [facility] Pager- [phone] 06/23/2017

## 2017-06-23 NOTE — Progress Notes (Signed)
Patient here today for follow up.   

## 2017-07-12 ENCOUNTER — Ambulatory Visit: Payer: Medicare Other | Attending: Family | Admitting: Family

## 2017-07-12 ENCOUNTER — Encounter: Payer: Self-pay | Admitting: Family

## 2017-07-12 ENCOUNTER — Other Ambulatory Visit: Payer: Self-pay

## 2017-07-12 VITALS — BP 130/82 | HR 65 | Resp 18 | Ht 66.0 in | Wt 309.4 lb

## 2017-07-12 DIAGNOSIS — N189 Chronic kidney disease, unspecified: Secondary | ICD-10-CM | POA: Diagnosis not present

## 2017-07-12 DIAGNOSIS — Z7984 Long term (current) use of oral hypoglycemic drugs: Secondary | ICD-10-CM | POA: Diagnosis not present

## 2017-07-12 DIAGNOSIS — I5032 Chronic diastolic (congestive) heart failure: Secondary | ICD-10-CM | POA: Diagnosis not present

## 2017-07-12 DIAGNOSIS — E1122 Type 2 diabetes mellitus with diabetic chronic kidney disease: Secondary | ICD-10-CM | POA: Diagnosis not present

## 2017-07-12 DIAGNOSIS — Z7901 Long term (current) use of anticoagulants: Secondary | ICD-10-CM | POA: Insufficient documentation

## 2017-07-12 DIAGNOSIS — R079 Chest pain, unspecified: Secondary | ICD-10-CM | POA: Diagnosis not present

## 2017-07-12 DIAGNOSIS — J449 Chronic obstructive pulmonary disease, unspecified: Secondary | ICD-10-CM | POA: Diagnosis not present

## 2017-07-12 DIAGNOSIS — R42 Dizziness and giddiness: Secondary | ICD-10-CM | POA: Diagnosis not present

## 2017-07-12 DIAGNOSIS — N183 Chronic kidney disease, stage 3 unspecified: Secondary | ICD-10-CM

## 2017-07-12 DIAGNOSIS — R635 Abnormal weight gain: Secondary | ICD-10-CM | POA: Diagnosis not present

## 2017-07-12 DIAGNOSIS — I130 Hypertensive heart and chronic kidney disease with heart failure and stage 1 through stage 4 chronic kidney disease, or unspecified chronic kidney disease: Secondary | ICD-10-CM | POA: Diagnosis not present

## 2017-07-12 DIAGNOSIS — Z794 Long term (current) use of insulin: Secondary | ICD-10-CM

## 2017-07-12 DIAGNOSIS — F419 Anxiety disorder, unspecified: Secondary | ICD-10-CM | POA: Diagnosis not present

## 2017-07-12 DIAGNOSIS — R5383 Other fatigue: Secondary | ICD-10-CM | POA: Diagnosis present

## 2017-07-12 DIAGNOSIS — G4733 Obstructive sleep apnea (adult) (pediatric): Secondary | ICD-10-CM | POA: Insufficient documentation

## 2017-07-12 DIAGNOSIS — Z79899 Other long term (current) drug therapy: Secondary | ICD-10-CM | POA: Insufficient documentation

## 2017-07-12 DIAGNOSIS — E039 Hypothyroidism, unspecified: Secondary | ICD-10-CM | POA: Diagnosis not present

## 2017-07-12 DIAGNOSIS — I10 Essential (primary) hypertension: Secondary | ICD-10-CM

## 2017-07-12 DIAGNOSIS — R0602 Shortness of breath: Secondary | ICD-10-CM | POA: Insufficient documentation

## 2017-07-12 MED ORDER — CLONIDINE HCL 0.1 MG PO TABS: 0.1000 mg | ORAL_TABLET | Freq: Two times a day (BID) | ORAL | 5 refills | Status: DC

## 2017-07-12 NOTE — Patient Instructions (Addendum)
Continue weighing daily and call for an overnight weight gain of > 2 pounds or a weekly weight gain of >5 pounds.  Begin clonidine 0.1mg  twice daily. Monitor BP and HR daily.

## 2017-07-12 NOTE — Progress Notes (Signed)
Patient ID: Andrea Rivers, female    DOB: 1956-07-23, 61 y.o.   MRN: [id number]  HPI  Andrea Rivers is a 61 y/o female with a history of anemia, anxiety, CKD, COPD, DM, GERD, HTN, hypothyroidism, neuropathy, obstructive sleep apnea (+CPAP), previous tobacco use and chronic heart failure.  Echo done 01/26/17 shows an EF of 50-55%.   Was in the ED 04/14/17 due to HF exacerbation. Was given IV diuretic X1 and she was released. Admitted 02/22/17 due to HF exacerbation which led to cardiac arrest. Initially was intubated and received IV lasix. Pulmonology, cardiology and nephrology were consulted. Discharged to rehab after 9 days. Admitted 01/26/17 due to acute respiratory failure, etiology unclear. Pulmonology consult obtained and 5 days of augmentin was given at discharge. Discharged after 2 days.   She presents today for her follow-up visit with a chief complaint of mild fatigue upon moderate exertion. She describes this as chronic in nature having been present for several years. She has associated shortness of breath, dizziness, edema, chest pain (with stress) and gradual weight gain. She denies any palpitations or difficulty sleeping. Says that her BP at home has been running 140-170/70-80's.   Past Medical History:  Diagnosis Date  . Anemia   . Anxiety   . CHF (congestive heart failure) ([place])   . Chronic kidney disease    INSUFFICIENCY  . COPD (chronic obstructive pulmonary disease) ([place])   . Diabetes mellitus without complication ([place])   . Dyspnea    DOE  . Dysrhythmia    A FIB  . Edema   . GERD (gastroesophageal reflux disease)   . History of kidney stones   . Hypertension   . Hypothyroidism    ABLATION  . Iron deficiency anemia 03/31/2017  . Neuropathy   . Orthopnea   . Sleep apnea    CPAP  . Vertigo   . Wheezing    Past Surgical History:  Procedure Laterality Date  . CATARACT EXTRACTION W/PHACO Left 01/26/2017   Procedure: CATARACT EXTRACTION PHACO AND INTRAOCULAR LENS  PLACEMENT (IOC);  Surgeon: Estill Cotta, MD;  Location: [facility] ORS;  Service: Ophthalmology;  Laterality: Left;  [place]   1:02.2 AP     23.7 CDE   28.92 fluid pack lot# [id number] H exp.05/08/2018  . CHOLECYSTECTOMY    . TONSILLECTOMY     Family History  Problem Relation Age of Onset  . COPD Mother   . Heart disease Mother   . Anemia Mother   . Heart disease Father   . COPD Father   . Anemia Sister   . Diabetes Maternal Grandmother   . Hypertension Paternal Grandfather    Social History   Tobacco Use  . Smoking status: Former Smoker    Packs/day: 3.00    Types: Cigarettes    Last attempt to quit: 1988    Years since quitting: 30.9  . Smokeless tobacco: Never Used  Substance Use Topics  . Alcohol use: No   Allergies  Allergen Reactions  . Other Anaphylaxis    ILOSONE,  Caused GI distress.  . Exenatide Other (See Comments)    Nausea-abdominal pain  . Rosiglitazone     Other reaction(s): Unknown     Prior to Admission medications   Medication Sig Start Date End Date Taking? Authorizing Provider  albuterol (PROAIR HFA) 108 (90 Base) MCG/ACT inhaler inhale 2 puffs by mouth INTO LUNGS every 4 to 6 hours if needed for wheezing or shortness of breath 03/14/17  Yes [provider]  amiodarone (PACERONE) 200 MG tablet Take 200 mg by mouth daily. 05/10/17  Yes [provider]  Calcium Carbonate-Vitamin D3 (CALCIUM 600-D) 600-400 MG-UNIT TABS Take 1 tablet by mouth daily.   Yes [provider]  Cholecalciferol (D3 HIGH POTENCY) 2000 units CAPS Take 1 capsule by mouth daily.   Yes [provider]  citalopram (CELEXA) 20 MG tablet Take 20 mg by mouth daily.   Yes [provider]  co-enzyme Q-10 50 MG capsule Take 50 mg by mouth daily.   Yes [provider]  ferrous sulfate 325 (65 FE) MG tablet Take 325 mg by mouth daily with breakfast.   Yes [provider]  Garlic 8341 MG CAPS Take 1 capsule by mouth daily.   Yes [provider]  glimepiride (AMARYL) 2 MG tablet Take 2 mg by mouth daily with breakfast.   Yes [provider]  HUMALOG KWIKPEN 100 UNIT/ML KiwkPen Inject 40 Units into the skin 3 (three) times daily. 03/14/17  Yes [provider]  levothyroxine (SYNTHROID, LEVOTHROID) 175 MCG tablet Take 175 mcg by mouth daily before breakfast.   Yes [provider]  linagliptin (TRADJENTA) 5 MG TABS tablet Take 5 mg by mouth daily.   Yes [provider]  lovastatin (MEVACOR) 20 MG tablet Take 20 mg by mouth at bedtime.   Yes [provider]  magnesium oxide (MAG-OX) 400 MG tablet Take 400 mg by mouth 2 (two) times daily.   Yes [provider]  meclizine (ANTIVERT) 12.5 MG tablet Take 1 tablet (12.5 mg total) by mouth 3 (three) times daily as needed for dizziness. 03/03/17  Yes Gladstone Lighter, MD  metolazone (ZAROXOLYN) 5 MG tablet Take 5 mg by mouth as needed.  04/18/17  Yes [provider]  metoprolol tartrate (LOPRESSOR) 25 MG tablet Take 25 mg by mouth 2 (two) times daily.   Yes [provider]  Omega-3 Fatty Acids (FISH OIL) 1000 MG CPDR Take 1 capsule by mouth daily.   Yes [provider]  oxyCODONE (OXY IR/ROXICODONE) 5 MG immediate release tablet Take 1 tablet (5 mg total) by mouth every 4 (four) hours as needed for moderate pain. 03/03/17  Yes Gladstone Lighter, MD  pantoprazole (PROTONIX) 40 MG tablet Take 40 mg by mouth 2 (two) times daily.   Yes [provider]  torsemide (DEMADEX) 20 MG tablet Take 20 mg by mouth 2 (two) times daily. 03/28/17  Yes [provider]  vitamin C (ASCORBIC ACID) 500 MG tablet Take 500 mg by mouth daily.   Yes [provider]  warfarin (COUMADIN) 1 MG tablet Take 1 mg daily at 6 PM by mouth.    Yes [provider]  warfarin (COUMADIN) 5 MG tablet Take 5 mg by mouth daily. 04/22/17  Yes [provider]    Review of Systems  Constitutional: Positive  for fatigue. Negative for appetite change.  HENT: Negative for congestion, postnasal drip and sore throat.   Eyes: Positive for visual disturbance (blurry vision in left eye). Negative for pain.  Respiratory: Positive for shortness of breath. Negative for cough and chest tightness.   Cardiovascular: Positive for chest pain (with stressful situations) and leg swelling. Negative for palpitations.  Gastrointestinal: Negative for abdominal distention and abdominal pain.  Endocrine: Negative.   Genitourinary: Negative.   Musculoskeletal: Positive for arthralgias (right shoulder) and back pain.          Skin: Negative.   Allergic/Immunologic: Negative.   Neurological: Positive for light-headedness (  due to vertigo). Negative for dizziness.  Hematological: Negative for adenopathy. Bruises/bleeds easily.  Psychiatric/Behavioral: Negative for dysphoric mood and sleep disturbance (sleeping on 6 pillows due to comfort). The patient is not nervous/anxious.    Vitals:   07/12/17 1333  BP: 130/82  Pulse: 65  Resp: 18  SpO2: 97%  Weight: (!) 309 lb 6 oz (140.3 kg)  Height: 5\' 6"  (1.676 m)   Wt Readings from Last 3 Encounters:  07/12/17 (!) 309 lb 6 oz (140.3 kg)  06/23/17 (!) 303 lb 10.9 oz (137.8 kg)  05/19/17 296 lb 13.6 oz (134.6 kg)    Lab Results  Component Value Date   CREATININE 1.66 (H) 04/14/2017   CREATININE 1.50 (H) 03/31/2017   CREATININE 1.59 (H) 03/03/2017   Physical Exam  Constitutional: She is oriented to person, place, and time. She appears well-developed and well-nourished.  HENT:  Head: Normocephalic and atraumatic.  Neck: Normal range of motion. Neck supple. No JVD present.  Cardiovascular: Normal rate and regular rhythm.  Pulmonary/Chest: Effort normal. She has no wheezes. She has no rales.  Abdominal: Soft. She exhibits no distension. There is no tenderness.  Musculoskeletal: She exhibits no edema or tenderness.  Neurological: She is alert and oriented to person,  place, and time.  Skin: Skin is warm and dry.  Psychiatric: She has a normal mood and affect. Her behavior is normal. Thought content normal.  Nursing note and vitals reviewed.   Assessment & Plan:  1: Chronic heart failure with preserved ejection fraction- - NYHA class II - mildly fluid overloaded today - weighing daily at home and has noticed a gradual weight gain. Instructed to call for an overnight weight gain of >2 pounds or a weekly weight gain of >5 pounds - weight up 6 pounds since she was last here - not adding salt to her food. Reports that she reads food labels at home and tries to keep intake to 2000mg  sodium daily. - BMP from 06/17/17 reviewed and shows sodium 142, potassium 3.3 and GFR 35 - saw cardiologist Ubaldo Glassing) 05/26/17 - does not elevate her legs much during the day because she says that she's always "on the go".  - does not meet ReDS vest criteria due to BMI  2: HTN- - BP looks good today - saw PCP (Feldpausch) 05/04/17 - says that her BP has been running higher at home with the top # being 140-170's - discussed adding ARB or clonidine. Due to her concern about her renal function, she would prefer to begin clonidine. Will start her on 0.1mg  twice daily - advised to monitor BP & HR daily and call for any big drop from her normal home readings  3: Diabetes- - glucose this morning was 144 fasting at home - A1c from 06/17/17 was 7.0% - saw endocrinologist Eddie Dibbles) 06/21/17  4: Obstructive sleep apnea- - has been wearing CPAP nightly   Patient did not bring her medications nor a list. Each medication was verbally reviewed with the patient and she was encouraged to bring the bottles to every visit to confirm accuracy of list.  Return in 1 month or sooner for any questions/problems before then.

## 2017-07-14 ENCOUNTER — Telehealth: Payer: Self-pay

## 2017-07-14 NOTE — Telephone Encounter (Signed)
Andrea Rivers called and left a voicemail that she was having fluid issues and would like to talk with you about another fluid medication. Please call.

## 2017-07-14 NOTE — Telephone Encounter (Signed)
LM on patient's voicemail asking her to call back to discuss her fluid status.

## 2017-07-19 ENCOUNTER — Other Ambulatory Visit: Payer: Medicare Other

## 2017-07-20 ENCOUNTER — Inpatient Hospital Stay: Payer: Medicare Other

## 2017-07-20 DIAGNOSIS — D509 Iron deficiency anemia, unspecified: Secondary | ICD-10-CM

## 2017-07-20 DIAGNOSIS — Z87442 Personal history of urinary calculi: Secondary | ICD-10-CM | POA: Insufficient documentation

## 2017-07-20 DIAGNOSIS — I4891 Unspecified atrial fibrillation: Secondary | ICD-10-CM | POA: Insufficient documentation

## 2017-07-20 DIAGNOSIS — E039 Hypothyroidism, unspecified: Secondary | ICD-10-CM | POA: Insufficient documentation

## 2017-07-20 DIAGNOSIS — D631 Anemia in chronic kidney disease: Secondary | ICD-10-CM | POA: Insufficient documentation

## 2017-07-20 DIAGNOSIS — N183 Chronic kidney disease, stage 3 (moderate): Secondary | ICD-10-CM | POA: Insufficient documentation

## 2017-07-20 DIAGNOSIS — D500 Iron deficiency anemia secondary to blood loss (chronic): Secondary | ICD-10-CM | POA: Insufficient documentation

## 2017-07-20 DIAGNOSIS — K219 Gastro-esophageal reflux disease without esophagitis: Secondary | ICD-10-CM | POA: Insufficient documentation

## 2017-07-20 DIAGNOSIS — G4730 Sleep apnea, unspecified: Secondary | ICD-10-CM | POA: Diagnosis not present

## 2017-07-20 DIAGNOSIS — Z7901 Long term (current) use of anticoagulants: Secondary | ICD-10-CM | POA: Insufficient documentation

## 2017-07-20 DIAGNOSIS — I5032 Chronic diastolic (congestive) heart failure: Secondary | ICD-10-CM | POA: Diagnosis not present

## 2017-07-20 DIAGNOSIS — I130 Hypertensive heart and chronic kidney disease with heart failure and stage 1 through stage 4 chronic kidney disease, or unspecified chronic kidney disease: Secondary | ICD-10-CM | POA: Diagnosis not present

## 2017-07-20 DIAGNOSIS — J449 Chronic obstructive pulmonary disease, unspecified: Secondary | ICD-10-CM | POA: Insufficient documentation

## 2017-07-20 DIAGNOSIS — E1122 Type 2 diabetes mellitus with diabetic chronic kidney disease: Secondary | ICD-10-CM | POA: Diagnosis not present

## 2017-07-20 DIAGNOSIS — Z7984 Long term (current) use of oral hypoglycemic drugs: Secondary | ICD-10-CM | POA: Insufficient documentation

## 2017-07-20 DIAGNOSIS — Z79899 Other long term (current) drug therapy: Secondary | ICD-10-CM | POA: Diagnosis not present

## 2017-07-20 DIAGNOSIS — Z87891 Personal history of nicotine dependence: Secondary | ICD-10-CM | POA: Insufficient documentation

## 2017-07-20 DIAGNOSIS — F419 Anxiety disorder, unspecified: Secondary | ICD-10-CM | POA: Insufficient documentation

## 2017-07-20 LAB — CBC WITH DIFFERENTIAL/PLATELET
Basophils Absolute: 0.1 10*3/uL (ref 0–0.1)
Basophils Relative: 1 %
EOS ABS: 0.2 10*3/uL (ref 0–0.7)
EOS PCT: 1 %
HCT: 36.3 % (ref 35.0–47.0)
Hemoglobin: 11.5 g/dL — ABNORMAL LOW (ref 12.0–16.0)
LYMPHS ABS: 1.6 10*3/uL (ref 1.0–3.6)
LYMPHS PCT: 13 %
MCH: 25.1 pg — AB (ref 26.0–34.0)
MCHC: 31.5 g/dL — ABNORMAL LOW (ref 32.0–36.0)
MCV: 79.6 fL — AB (ref 80.0–100.0)
MONO ABS: 0.9 10*3/uL (ref 0.2–0.9)
MONOS PCT: 7 %
Neutro Abs: 9.1 10*3/uL — ABNORMAL HIGH (ref 1.4–6.5)
Neutrophils Relative %: 78 %
PLATELETS: 240 10*3/uL (ref 150–440)
RBC: 4.56 MIL/uL (ref 3.80–5.20)
RDW: 21.4 % — AB (ref 11.5–14.5)
WBC: 11.8 10*3/uL — AB (ref 3.6–11.0)

## 2017-07-20 LAB — IRON AND TIBC
IRON: 27 ug/dL — AB (ref 28–170)
Saturation Ratios: 9 % — ABNORMAL LOW (ref 10.4–31.8)
TIBC: 319 ug/dL (ref 250–450)
UIBC: 292 ug/dL

## 2017-07-20 LAB — TSH: TSH: 3.535 u[IU]/mL (ref 0.350–4.500)

## 2017-07-20 LAB — FERRITIN: Ferritin: 61 ng/mL (ref 11–307)

## 2017-07-21 ENCOUNTER — Inpatient Hospital Stay: Payer: Medicare Other | Attending: Oncology | Admitting: Oncology

## 2017-07-21 ENCOUNTER — Inpatient Hospital Stay: Payer: Medicare Other

## 2017-07-21 ENCOUNTER — Encounter: Payer: Self-pay | Admitting: Oncology

## 2017-07-21 ENCOUNTER — Other Ambulatory Visit: Payer: Self-pay

## 2017-07-21 VITALS — BP 120/83 | HR 59 | Temp 97.0°F | Resp 20

## 2017-07-21 VITALS — BP 151/77 | HR 56 | Temp 96.8°F | Wt 308.5 lb

## 2017-07-21 DIAGNOSIS — D500 Iron deficiency anemia secondary to blood loss (chronic): Secondary | ICD-10-CM | POA: Diagnosis not present

## 2017-07-21 DIAGNOSIS — Z79899 Other long term (current) drug therapy: Secondary | ICD-10-CM | POA: Diagnosis not present

## 2017-07-21 DIAGNOSIS — N183 Chronic kidney disease, stage 3 unspecified: Secondary | ICD-10-CM

## 2017-07-21 DIAGNOSIS — E039 Hypothyroidism, unspecified: Secondary | ICD-10-CM

## 2017-07-21 DIAGNOSIS — E631 Imbalance of constituents of food intake: Secondary | ICD-10-CM

## 2017-07-21 DIAGNOSIS — D631 Anemia in chronic kidney disease: Secondary | ICD-10-CM

## 2017-07-21 DIAGNOSIS — D509 Iron deficiency anemia, unspecified: Secondary | ICD-10-CM

## 2017-07-21 DIAGNOSIS — E1122 Type 2 diabetes mellitus with diabetic chronic kidney disease: Secondary | ICD-10-CM

## 2017-07-21 DIAGNOSIS — I130 Hypertensive heart and chronic kidney disease with heart failure and stage 1 through stage 4 chronic kidney disease, or unspecified chronic kidney disease: Secondary | ICD-10-CM | POA: Diagnosis not present

## 2017-07-21 MED ORDER — IRON SUCROSE 20 MG/ML IV SOLN
200.0000 mg | Freq: Once | INTRAVENOUS | Status: AC
  Administered 2017-07-21: 200 mg via INTRAVENOUS
  Filled 2017-07-21: qty 10

## 2017-07-21 MED ORDER — SODIUM CHLORIDE 0.9 % IV SOLN
Freq: Once | INTRAVENOUS | Status: AC
Start: 2017-07-21 — End: 2017-07-21
  Administered 2017-07-21: 11:00:00 via INTRAVENOUS
  Filled 2017-07-21: qty 1000

## 2017-07-21 MED ORDER — IRON SUCROSE 20 MG/ML IV SOLN
INTRAVENOUS | Status: AC
  Filled 2017-07-21: qty 10

## 2017-07-21 NOTE — Progress Notes (Signed)
Hematology/Oncology Follow up visit [facility] [phone] [phone]   Patient Care Team: Sofie Hartigan, MD as PCP - General (Family Medicine) Alisa Graff, FNP as Nurse Practitioner (Family Medicine) Ubaldo Glassing Javier Docker, MD as Consulting Physician (Cardiology) Elease Etienne, MD as Consulting Physician (Endocrinology)  CHIEF COMPLAINTS/REASON FOR VISIT Follow up for treatment of Anemia    HISTORY OF PRESENTING ILLNESS:  Andrea Rivers 61 y.o.  female with past medical history listed as below who was referred by Dr. Holley Raring to me for evaluation and management of anemia.  Patient was recently hospitalized due to acute respiratory failure secondary to diastolic CHF exacerbation. She was initially admitted to ICU, intubated and required mechanical ventilation. She also had acute on chronic kidney failure. Her recent labs showed microcytic anemia with hemoglobin  6.5, MCV 67.9. She received blood transfusion on 02/24/2017. Hemoglobin improved to 8.6. Patient reports fatigue, and a lack of energy. She has some lower extremity edema. She lives at home by herself and is able to do her ADLs. She takes warfarin for A. Fib. Denies any bleeding events, blood in the stool or black stool.  INTERVAL HISTORY  Patient presents for follow up for management of anemia. Patient reports feeling well, and it is slightly improved. She has follow-up with her cardiologist and was recently started on clonidine.   Review of Systems  Constitutional: Negative for appetite change, chills and fatigue.  HENT:   Negative for hearing loss and lump/mass.   Eyes: Negative for eye problems.  Respiratory: Negative for chest tightness, cough and shortness of breath.   Cardiovascular: Negative for chest pain.  Gastrointestinal: Negative for abdominal distention and abdominal pain.  Endocrine: Negative for hot flashes.  Genitourinary: Negative for difficulty urinating,  dysuria and frequency.   Musculoskeletal: Negative for arthralgias, back pain and gait problem.  Skin: Negative for itching.  Neurological: Negative for dizziness and gait problem.  Hematological: Negative for adenopathy. Does not bruise/bleed easily.  Psychiatric/Behavioral: Negative for confusion. The patient is not nervous/anxious.    MEDICAL HISTORY:  Past Medical History:  Diagnosis Date  . Anemia   . Anxiety   . CHF (congestive heart failure) ([place])   . Chronic kidney disease    INSUFFICIENCY  . COPD (chronic obstructive pulmonary disease) ([place])   . Diabetes mellitus without complication ([place])   . Dyspnea    DOE  . Dysrhythmia    A FIB  . Edema   . GERD (gastroesophageal reflux disease)   . History of kidney stones   . Hypertension   . Hypothyroidism    ABLATION  . Iron deficiency anemia 03/31/2017  . Neuropathy   . Orthopnea   . Sleep apnea    CPAP  . Vertigo   . Wheezing     SURGICAL HISTORY: Past Surgical History:  Procedure Laterality Date  . CATARACT EXTRACTION W/PHACO Left 01/26/2017   Procedure: CATARACT EXTRACTION PHACO AND INTRAOCULAR LENS PLACEMENT (IOC);  Surgeon: Estill Cotta, MD;  Location: [facility] ORS;  Service: Ophthalmology;  Laterality: Left;  [place]   1:02.2 AP     23.7 CDE   28.92 fluid pack lot# [id number] H exp.05/08/2018  . CHOLECYSTECTOMY    . TONSILLECTOMY      SOCIAL HISTORY: Social History   Socioeconomic History  . Marital status: Legally Separated    Spouse name: Not on file  . Number of children: Not on file  . Years of education: Not on file  . Highest education level: Not  on file  Social Needs  . Financial resource strain: Not on file  . Food insecurity - worry: Not on file  . Food insecurity - inability: Not on file  . Transportation needs - medical: Not on file  . Transportation needs - non-medical: Not on file  Occupational History  . Not on file  Tobacco Use  . Smoking status: Former Smoker    Packs/day: 3.00     Types: Cigarettes    Last attempt to quit: 1988    Years since quitting: 30.9  . Smokeless tobacco: Never Used  Substance and Sexual Activity  . Alcohol use: No  . Drug use: No  . Sexual activity: Not on file  Other Topics Concern  . Not on file  Social History Narrative  . Not on file    FAMILY HISTORY: Family History  Problem Relation Age of Onset  . COPD Mother   . Heart disease Mother   . Anemia Mother   . Heart disease Father   . COPD Father   . Anemia Sister   . Diabetes Maternal Grandmother   . Hypertension Paternal Grandfather     ALLERGIES:  is allergic to other; exenatide; and rosiglitazone.  MEDICATIONS:  Current Outpatient Medications  Medication Sig Dispense Refill  . albuterol (PROAIR HFA) 108 (90 Base) MCG/ACT inhaler inhale 2 puffs by mouth INTO LUNGS every 4 to 6 hours if needed for wheezing or shortness of breath    . amiodarone (PACERONE) 200 MG tablet Take 200 mg by mouth daily.  0  . Calcium Carbonate-Vitamin D3 (CALCIUM 600-D) 600-400 MG-UNIT TABS Take 1 tablet by mouth daily.    . Cholecalciferol (D3 HIGH POTENCY) 2000 units CAPS Take 1 capsule by mouth daily.    . citalopram (CELEXA) 20 MG tablet Take 20 mg by mouth daily.    . cloNIDine (CATAPRES) 0.1 MG tablet Take 1 tablet (0.1 mg total) by mouth 2 (two) times daily. 60 tablet 5  . co-enzyme Q-10 50 MG capsule Take 50 mg by mouth daily.    . ferrous sulfate 325 (65 FE) MG tablet Take 325 mg by mouth daily with breakfast.    . Garlic 1740 MG CAPS Take 1 capsule by mouth daily.    Marland Kitchen glimepiride (AMARYL) 2 MG tablet Take 2 mg by mouth daily with breakfast.    . HUMALOG KWIKPEN 100 UNIT/ML KiwkPen Inject 40 Units into the skin 3 (three) times daily.  0  . levothyroxine (SYNTHROID, LEVOTHROID) 175 MCG tablet Take 175 mcg by mouth daily before breakfast.    . linagliptin (TRADJENTA) 5 MG TABS tablet Take 5 mg by mouth daily.    Marland Kitchen lovastatin (MEVACOR) 20 MG tablet Take 20 mg by mouth at bedtime.    .  magnesium oxide (MAG-OX) 400 MG tablet Take 400 mg by mouth 2 (two) times daily.    . meclizine (ANTIVERT) 12.5 MG tablet Take 1 tablet (12.5 mg total) by mouth 3 (three) times daily as needed for dizziness. 30 tablet 0  . metolazone (ZAROXOLYN) 5 MG tablet Take 5 mg by mouth as needed.     . metoprolol tartrate (LOPRESSOR) 25 MG tablet Take 25 mg by mouth 2 (two) times daily.    . Omega-3 Fatty Acids (FISH OIL) 1000 MG CPDR Take 1 capsule by mouth daily.    Marland Kitchen oxyCODONE (OXY IR/ROXICODONE) 5 MG immediate release tablet Take 1 tablet (5 mg total) by mouth every 4 (four) hours as needed for moderate pain. 20 tablet  0  . pantoprazole (PROTONIX) 40 MG tablet Take 40 mg by mouth 2 (two) times daily.    Marland Kitchen torsemide (DEMADEX) 20 MG tablet Take 20 mg by mouth 2 (two) times daily.    . vitamin C (ASCORBIC ACID) 500 MG tablet Take 500 mg by mouth daily.    Marland Kitchen warfarin (COUMADIN) 1 MG tablet Take 1 mg daily at 6 PM by mouth.     . warfarin (COUMADIN) 5 MG tablet Take 5 mg by mouth daily.  0   No current facility-administered medications for this visit.       Marland Kitchen  PHYSICAL EXAMINATION: ECOG PERFORMANCE STATUS: 1 - Symptomatic but completely ambulatory Vitals:   07/21/17 1013  BP: (!) 151/77  Pulse: (!) 56  Temp: (!) 96.8 F (36 C)   Filed Weights   07/21/17 1013  Weight: (!) 308 lb 8.5 oz (139.9 kg)   Physical Exam  Constitutional: She is oriented to person, place, and time and well-developed, well-nourished, and in no distress. No distress.  HENT:  Head: Normocephalic.  Mouth/Throat: No oropharyngeal exudate.  Eyes: Conjunctivae and EOM are normal. Pupils are equal, round, and reactive to light. Right eye exhibits no discharge.  Neck: Normal range of motion. Neck supple.  Cardiovascular: Normal rate.  No murmur heard. Pulmonary/Chest: Effort normal and breath sounds normal. No respiratory distress. She has no wheezes.  Abdominal: Soft. Bowel sounds are normal. She exhibits no distension.    Musculoskeletal: She exhibits edema.  Chronic 1+ edema,   Lymphadenopathy:    She has no cervical adenopathy.  Neurological: She is alert and oriented to person, place, and time. A cranial nerve deficit is present.  Skin: Skin is warm and dry. No erythema.  Bilateral LE vein insufficiency skin changes.   Psychiatric: Affect normal.     LABORATORY DATA:  I have reviewed the data as listed CBC Latest Ref Rng & Units 07/20/2017 06/22/2017 05/18/2017  WBC 3.6 - 11.0 K/uL 11.8(H) 11.5(H) 13.4(H)  Hemoglobin 12.0 - 16.0 g/dL 11.5(L) 11.3(L) 10.7(L)  Hematocrit 35.0 - 47.0 % 36.3 36.0 34.7(L)  Platelets 150 - 440 K/uL 240 213 279   Recent Labs    02/25/17 0501  03/03/17 0404 03/31/17 1035 04/14/17 1100  NA 141   < > 139 139 140  K 4.1   < > 3.9 4.3 4.2  CL 100*   < > 101 99* 99*  CO2 30   < > 30 33* 30  GLUCOSE 175*   < > 195* 132* 146*  BUN 53*   < > 41* 27* 23*  CREATININE 1.96*   < > 1.59* 1.50* 1.66*  CALCIUM 8.6*   < > 8.9 8.9 9.0  GFRNONAA 26*   < > 34* 36* 32*  GFRAA 31*   < > 39* 42* 37*  PROT 7.2  --   --  7.8 7.7  ALBUMIN 3.0*  --   --  3.6 3.5  AST 23  --   --  17 25  ALT 21  --   --  10* 10*  ALKPHOS 70  --   --  100 98  BILITOT 1.2  --   --  0.7 0.6   < > = values in this interval not displayed.     Labs obtained at the nephrologist office showed Peripheral blood Protein electrophoresis revealed no monoclonal spike. Urine protein electrophoresis negative for monoclonal spike. Hemoglobin 7.9, WBC 12.1, MCV 72, RDW 19, platelet 283,000, neutrophil 9.2, lymphocyte 1.6,  monocyte 1.0, a ANA negative.  ASSESSMENT & PLAN:  1. Iron deficiency anemia due to chronic blood loss   2. CKD (chronic kidney disease) stage 3, GFR 30-59 ml/min (HCC)   3. Microcytic anemia   4. Anemia of chronic kidney failure, stage 3 (moderate) (HCC)    Her anemia likely multifactorial from chronic kidney disease, as well as iron deficiency.  Her ferritin improved after IV manner for  infusion however comes down again. I suspect that she has ongoing blood loss likely from GI. UA was negative for hematuria. She is on warfarin. Previously discussed with patient for colonoscopy patient declined. Given that she is on blood thinner and has possible ongoing blood loss from GI tract, we will give her 1 more dose of Venofer today  # Hemoglobin improved, ABOVE 10. no need for Procrit. # Blood pressure better controlled. She was recently started on clonidine by cardiologist. Continue follow-up for blood pressure medication titration.   Follow-up was primary care physician regarding optimizing hypothyroidism managemenTSH normalized.  The patient knows to call the clinic with any problems questions or concerns.  Return of visit: 10 weeks with CBC, TIBC, ferritin done one or 2 days prior to  Earlie Server, MD, PhD Hematology Oncology [facility] Pager- [phone] 07/21/2017

## 2017-07-21 NOTE — Progress Notes (Signed)
Patient here today for follow up.  Patient states no new concerns today  

## 2017-07-21 NOTE — Patient Instructions (Signed)
Iron Sucrose injection What is this medicine? IRON SUCROSE (AHY ern SOO krohs) is an iron complex. Iron is used to make healthy red blood cells, which carry oxygen and nutrients throughout the body. This medicine is used to treat iron deficiency anemia in people with chronic kidney disease. This medicine may be used for other purposes; ask your health care provider or pharmacist if you have questions. COMMON BRAND NAME(S): Venofer What should I tell my health care provider before I take this medicine? They need to know if you have any of these conditions: -anemia not caused by low iron levels -heart disease -high levels of iron in the blood -kidney disease -liver disease -an unusual or allergic reaction to iron, other medicines, foods, dyes, or preservatives -pregnant or trying to get pregnant -breast-feeding How should I use this medicine? This medicine is for infusion into a vein. It is given by a health care professional in a hospital or clinic setting. Talk to your pediatrician regarding the use of this medicine in children. While this drug may be prescribed for children as young as 2 years for selected conditions, precautions do apply. Overdosage: If you think you have taken too much of this medicine contact a poison control center or emergency room at once. NOTE: This medicine is only for you. Do not share this medicine with others. What if I miss a dose? It is important not to miss your dose. Call your doctor or health care professional if you are unable to keep an appointment. What may interact with this medicine? Do not take this medicine with any of the following medications: -deferoxamine -dimercaprol -other iron products This medicine may also interact with the following medications: -chloramphenicol -deferasirox This list may not describe all possible interactions. Give your health care provider a list of all the medicines, herbs, non-prescription drugs, or dietary  supplements you use. Also tell them if you smoke, drink alcohol, or use illegal drugs. Some items may interact with your medicine. What should I watch for while using this medicine? Visit your doctor or healthcare professional regularly. Tell your doctor or healthcare professional if your symptoms do not start to get better or if they get worse. You may need blood work done while you are taking this medicine. You may need to follow a special diet. Talk to your doctor. Foods that contain iron include: whole grains/cereals, dried fruits, beans, or peas, leafy green vegetables, and organ meats (liver, kidney). What side effects may I notice from receiving this medicine? Side effects that you should report to your doctor or health care professional as soon as possible: -allergic reactions like skin rash, itching or hives, swelling of the face, lips, or tongue -breathing problems -changes in blood pressure -cough -fast, irregular heartbeat -feeling faint or lightheaded, falls -fever or chills -flushing, sweating, or hot feelings -joint or muscle aches/pains -seizures -swelling of the ankles or feet -unusually weak or tired Side effects that usually do not require medical attention (report to your doctor or health care professional if they continue or are bothersome): -diarrhea -feeling achy -headache -irritation at site where injected -nausea, vomiting -stomach upset -tiredness This list may not describe all possible side effects. Call your doctor for medical advice about side effects. You may report side effects to FDA at 1-800-FDA-1088. Where should I keep my medicine? This drug is given in a hospital or clinic and will not be stored at home. NOTE: This sheet is a summary. It may not cover all possible information. If   you have questions about this medicine, talk to your doctor, pharmacist, or health care provider.  2018 Elsevier/Gold Standard (2011-05-06 17:14:35)  

## 2017-08-11 ENCOUNTER — Ambulatory Visit: Payer: Medicare Other | Attending: Family | Admitting: Family

## 2017-08-11 ENCOUNTER — Other Ambulatory Visit: Payer: Self-pay

## 2017-08-11 ENCOUNTER — Telehealth: Payer: Self-pay | Admitting: Family

## 2017-08-11 ENCOUNTER — Encounter: Payer: Self-pay | Admitting: Family

## 2017-08-11 VITALS — BP 110/78 | HR 44 | Resp 18 | Ht 65.0 in | Wt 309.5 lb

## 2017-08-11 DIAGNOSIS — Z7901 Long term (current) use of anticoagulants: Secondary | ICD-10-CM | POA: Diagnosis not present

## 2017-08-11 DIAGNOSIS — E1140 Type 2 diabetes mellitus with diabetic neuropathy, unspecified: Secondary | ICD-10-CM | POA: Insufficient documentation

## 2017-08-11 DIAGNOSIS — E039 Hypothyroidism, unspecified: Secondary | ICD-10-CM | POA: Insufficient documentation

## 2017-08-11 DIAGNOSIS — J449 Chronic obstructive pulmonary disease, unspecified: Secondary | ICD-10-CM | POA: Insufficient documentation

## 2017-08-11 DIAGNOSIS — Z79899 Other long term (current) drug therapy: Secondary | ICD-10-CM | POA: Insufficient documentation

## 2017-08-11 DIAGNOSIS — Z9049 Acquired absence of other specified parts of digestive tract: Secondary | ICD-10-CM | POA: Insufficient documentation

## 2017-08-11 DIAGNOSIS — N189 Chronic kidney disease, unspecified: Secondary | ICD-10-CM | POA: Insufficient documentation

## 2017-08-11 DIAGNOSIS — E1122 Type 2 diabetes mellitus with diabetic chronic kidney disease: Secondary | ICD-10-CM | POA: Diagnosis not present

## 2017-08-11 DIAGNOSIS — I5032 Chronic diastolic (congestive) heart failure: Secondary | ICD-10-CM | POA: Diagnosis present

## 2017-08-11 DIAGNOSIS — G4733 Obstructive sleep apnea (adult) (pediatric): Secondary | ICD-10-CM | POA: Diagnosis not present

## 2017-08-11 DIAGNOSIS — Z7984 Long term (current) use of oral hypoglycemic drugs: Secondary | ICD-10-CM | POA: Insufficient documentation

## 2017-08-11 DIAGNOSIS — I10 Essential (primary) hypertension: Secondary | ICD-10-CM

## 2017-08-11 DIAGNOSIS — I130 Hypertensive heart and chronic kidney disease with heart failure and stage 1 through stage 4 chronic kidney disease, or unspecified chronic kidney disease: Secondary | ICD-10-CM | POA: Insufficient documentation

## 2017-08-11 DIAGNOSIS — Z87891 Personal history of nicotine dependence: Secondary | ICD-10-CM | POA: Diagnosis not present

## 2017-08-11 DIAGNOSIS — N183 Chronic kidney disease, stage 3 unspecified: Secondary | ICD-10-CM

## 2017-08-11 DIAGNOSIS — R001 Bradycardia, unspecified: Secondary | ICD-10-CM | POA: Diagnosis not present

## 2017-08-11 DIAGNOSIS — K219 Gastro-esophageal reflux disease without esophagitis: Secondary | ICD-10-CM | POA: Diagnosis not present

## 2017-08-11 DIAGNOSIS — Z794 Long term (current) use of insulin: Secondary | ICD-10-CM

## 2017-08-11 DIAGNOSIS — F419 Anxiety disorder, unspecified: Secondary | ICD-10-CM | POA: Diagnosis not present

## 2017-08-11 DIAGNOSIS — Z79890 Hormone replacement therapy (postmenopausal): Secondary | ICD-10-CM | POA: Diagnosis not present

## 2017-08-11 LAB — BASIC METABOLIC PANEL
Anion gap: 11 (ref 5–15)
BUN: 48 mg/dL — AB (ref 6–20)
CHLORIDE: 93 mmol/L — AB (ref 101–111)
CO2: 36 mmol/L — AB (ref 22–32)
CREATININE: 1.6 mg/dL — AB (ref 0.44–1.00)
Calcium: 9.1 mg/dL (ref 8.9–10.3)
GFR calc Af Amer: 39 mL/min — ABNORMAL LOW (ref >60)
GFR calc non Af Amer: 33 mL/min — ABNORMAL LOW (ref >60)
Glucose, Bld: 181 mg/dL — ABNORMAL HIGH (ref 65–99)
Potassium: 4.2 mmol/L (ref 3.5–5.1)
Sodium: 140 mmol/L (ref 135–145)

## 2017-08-11 MED ORDER — FUROSEMIDE 20 MG PO TABS: 20.0000 mg | ORAL_TABLET | Freq: Every day | ORAL | 5 refills | Status: DC

## 2017-08-11 NOTE — Progress Notes (Signed)
Patient ID: Andrea Rivers, female    DOB: 1955/12/23, 62 y.o.   MRN: [id number]  HPI  Andrea Rivers is a 62 y/o female with a history of anemia, anxiety, CKD, COPD, DM, GERD, HTN, hypothyroidism, neuropathy, obstructive sleep apnea (+CPAP), previous tobacco use and chronic heart failure.  Echo done 01/26/17 shows an EF of 50-55%.   Was in the ED 04/14/17 due to HF exacerbation. Was given IV diuretic X1 and she was released. Admitted 02/22/17 due to HF exacerbation which led to cardiac arrest. Initially was intubated and received IV lasix. Pulmonology, cardiology and nephrology were consulted. Discharged to rehab after 9 days.   She presents today for her follow-up visit with a chief complaint of chronic minimal fatigue upon moderate exertion. She says that it varies in severity. She has associated shortness of breath, light-headedness, edema and easy bruising along with this. She denies any chest pain, palpitations, abdominal distention or difficulty sleeping.   Past Medical History:  Diagnosis Date  . Anemia   . Anxiety   . CHF (congestive heart failure) ([place])   . Chronic kidney disease    INSUFFICIENCY  . COPD (chronic obstructive pulmonary disease) ([place])   . Diabetes mellitus without complication ([place])   . Dyspnea    DOE  . Dysrhythmia    A FIB  . Edema   . GERD (gastroesophageal reflux disease)   . History of kidney stones   . Hypertension   . Hypothyroidism    ABLATION  . Iron deficiency anemia 03/31/2017  . Neuropathy   . Orthopnea   . Sleep apnea    CPAP  . Vertigo   . Wheezing    Past Surgical History:  Procedure Laterality Date  . CATARACT EXTRACTION W/PHACO Left 01/26/2017   Procedure: CATARACT EXTRACTION PHACO AND INTRAOCULAR LENS PLACEMENT (IOC);  Surgeon: Estill Cotta, MD;  Location: [facility] ORS;  Service: Ophthalmology;  Laterality: Left;  [place]   1:02.2 AP     23.7 CDE   28.92 fluid pack lot# [id number] H exp.05/08/2018  . CHOLECYSTECTOMY    . TONSILLECTOMY      Family History  Problem Relation Age of Onset  . COPD Mother   . Heart disease Mother   . Anemia Mother   . Heart disease Father   . COPD Father   . Anemia Sister   . Diabetes Maternal Grandmother   . Hypertension Paternal Grandfather    Social History   Tobacco Use  . Smoking status: Former Smoker    Packs/day: 3.00    Types: Cigarettes    Last attempt to quit: 1988    Years since quitting: 31.0  . Smokeless tobacco: Never Used  Substance Use Topics  . Alcohol use: No   Allergies  Allergen Reactions  . Other Anaphylaxis    ILOSONE,  Caused GI distress.  . Exenatide Other (See Comments)    Nausea-abdominal pain  . Rosiglitazone     Other reaction(s): Unknown     Prior to Admission medications   Medication Sig Start Date End Date Taking? Authorizing Provider  albuterol (PROAIR HFA) 108 (90 Base) MCG/ACT inhaler inhale 2 puffs by mouth INTO LUNGS every 4 to 6 hours if needed for wheezing or shortness of breath 03/14/17  Yes [provider]  amiodarone (PACERONE) 200 MG tablet Take 200 mg by mouth daily. 05/10/17  Yes [provider]  Calcium Carbonate-Vitamin D3 (CALCIUM 600-D) 600-400 MG-UNIT TABS Take 1 tablet by mouth daily.   Yes [provider]  Cholecalciferol (D3 HIGH POTENCY) 2000 units CAPS Take 1 capsule by mouth daily.   Yes [provider]  citalopram (CELEXA) 20 MG tablet Take 20 mg by mouth daily.   Yes [provider]  cloNIDine (CATAPRES) 0.1 MG tablet Take 1 tablet (0.1 mg total) by mouth 2 (two) times daily. 07/12/17  Yes Hackney, Otila Kluver A, FNP  co-enzyme Q-10 50 MG capsule Take 50 mg by mouth daily.   Yes [provider]  ferrous sulfate 325 (65 FE) MG tablet Take 325 mg by mouth daily with breakfast.   Yes [provider]  furosemide (LASIX) 20 MG tablet Take 1 tablet (20 mg total) by mouth daily. 08/11/17  Yes Hackney, Tina A, FNP  Garlic 9562 MG CAPS Take 1 capsule by mouth daily.   Yes  [provider]  glimepiride (AMARYL) 2 MG tablet Take 2 mg by mouth daily with breakfast.   Yes [provider]  HUMALOG KWIKPEN 100 UNIT/ML KiwkPen Inject 40 Units into the skin 3 (three) times daily. 03/14/17  Yes [provider]  levothyroxine (SYNTHROID, LEVOTHROID) 175 MCG tablet Take 175 mcg by mouth daily before breakfast.   Yes [provider]  linagliptin (TRADJENTA) 5 MG TABS tablet Take 5 mg by mouth daily.   Yes [provider]  lovastatin (MEVACOR) 20 MG tablet Take 20 mg by mouth at bedtime.   Yes [provider]  magnesium oxide (MAG-OX) 400 MG tablet Take 400 mg by mouth 2 (two) times daily.   Yes [provider]  meclizine (ANTIVERT) 12.5 MG tablet Take 1 tablet (12.5 mg total) by mouth 3 (three) times daily as needed for dizziness. 03/03/17  Yes Gladstone Lighter, MD  metolazone (ZAROXOLYN) 5 MG tablet Take 5 mg by mouth as needed.  04/18/17  Yes [provider]  metoprolol tartrate (LOPRESSOR) 25 MG tablet Take 25 mg by mouth 2 (two) times daily.   Yes [provider]  Omega-3 Fatty Acids (FISH OIL) 1000 MG CPDR Take 1 capsule by mouth daily.   Yes [provider]  oxyCODONE (OXY IR/ROXICODONE) 5 MG immediate release tablet Take 1 tablet (5 mg total) by mouth every 4 (four) hours as needed for moderate pain. 03/03/17  Yes Gladstone Lighter, MD  pantoprazole (PROTONIX) 40 MG tablet Take 40 mg by mouth 2 (two) times daily.   Yes [provider]  POTASSIUM CHLORIDE CRYS ER PO Take by mouth daily. 1 OTC potassium daily   Yes [provider]  torsemide (DEMADEX) 20 MG tablet Take 40 mg by mouth daily.  03/28/17  Yes [provider]  vitamin C (ASCORBIC ACID) 500 MG tablet Take 500 mg by mouth daily.   Yes [provider]  warfarin (COUMADIN) 1 MG tablet Take 1 mg daily at 6 PM by mouth.    Yes [provider]  warfarin (COUMADIN) 5 MG tablet Take 5 mg by  mouth daily. 04/22/17  Yes [provider]    Review of Systems  Constitutional: Positive for fatigue. Negative for appetite change.  HENT: Negative for congestion, postnasal drip and sore throat.   Eyes: Positive for visual disturbance (blurry vision in left eye). Negative for pain.  Respiratory: Positive for shortness of breath. Negative for cough and chest tightness.   Cardiovascular: Positive for leg swelling. Negative for chest pain and palpitations.  Gastrointestinal: Negative for abdominal distention and abdominal pain.  Endocrine: Negative.   Genitourinary: Negative.   Musculoskeletal: Positive for arthralgias (right shoulder) and  back pain.          Skin: Negative.   Allergic/Immunologic: Negative.   Neurological: Positive for light-headedness (due to vertigo). Negative for dizziness.  Hematological: Negative for adenopathy. Bruises/bleeds easily.  Psychiatric/Behavioral: Negative for dysphoric mood and sleep disturbance (sleeping on 6 pillows due to comfort). The patient is not nervous/anxious.    Vitals:   08/11/17 0952  BP: 110/78  Pulse: (!) 44  Resp: 18  SpO2: 96%  Weight: (!) 309 lb 8 oz (140.4 kg)  Height: 5\' 5"  (1.651 m)   Wt Readings from Last 3 Encounters:  08/11/17 (!) 309 lb 8 oz (140.4 kg)  07/21/17 (!) 308 lb 8.5 oz (139.9 kg)  07/12/17 (!) 309 lb 6 oz (140.3 kg)    Lab Results  Component Value Date   CREATININE 1.66 (H) 04/14/2017   CREATININE 1.50 (H) 03/31/2017   CREATININE 1.59 (H) 03/03/2017   Physical Exam  Constitutional: She is oriented to person, place, and time. She appears well-developed and well-nourished.  HENT:  Head: Normocephalic and atraumatic.  Neck: Normal range of motion. Neck supple. No JVD present.  Cardiovascular: Normal rate and regular rhythm.  Pulmonary/Chest: Effort normal. She has no wheezes. She has no rales.  Abdominal: Soft. She exhibits no distension. There is no tenderness.  Musculoskeletal: She exhibits  edema (trace edema in bilateral ankles). She exhibits no tenderness.  Neurological: She is alert and oriented to person, place, and time.  Skin: Skin is warm and dry.  Psychiatric: She has a normal mood and affect. Her behavior is normal. Thought content normal.  Nursing note and vitals reviewed.   Assessment & Plan:  1: Chronic heart failure with preserved ejection fraction- - NYHA class II - euvolemic today - weighing daily at home. Instructed to call for an overnight weight gain of >2 pounds or a weekly weight gain of >5 pounds - weight stable - not adding salt to her food. Reports that she reads food labels at home and tries to keep intake to 2000mg  sodium daily. - BMP from 06/17/17 reviewed and shows sodium 142, potassium 3.3 and GFR 35 - saw cardiologist Ubaldo Glassing) 05/26/17 - now taking 20mg  furosemide AM along with 40mg  torsemide AM for the last 5 days - will get a BMP today due to change in diuretic - does not elevate her legs much during the day because she says that she's always "on the go".  - does not meet ReDS vest criteria due to BMI  2: HTN- - BP looks good today - saw PCP (Feldpausch) 05/04/17 - clonidine was started at her last visit - BP at home has declined from 170/81 to 132/71 since clonidine was started  3: Diabetes- - glucose this morning at home was 114 fasting - A1c from 06/17/17 was 7.0% - saw endocrinologist Eddie Dibbles) 06/21/17  4: Bradycardia- - HR remains in the 40's - stop metoprolol - sees PCP 08/24/17 and cardiologist 09/05/17 - continue to monitor BP and HR at home  Patient did not bring her medications nor a list. Each medication was verbally reviewed with the patient and she was encouraged to bring the bottles to every visit to confirm accuracy of list.  Return in 3 months or sooner for any questions/problems before then

## 2017-08-11 NOTE — Patient Instructions (Signed)
Continue weighing daily and call for an overnight weight gain of > 2 pounds or a weekly weight gain of >5 pounds.  Stop metoprolol and continue to monitor blood pressure and heart rate.

## 2017-08-11 NOTE — Telephone Encounter (Signed)
LM on machine that potassium level was normal and that her renal function was stable. Asked her to call us back with the over the counter dosage of potassium that she is taking.

## 2017-08-12 ENCOUNTER — Ambulatory Visit: Payer: Medicare Other | Admitting: Family

## 2017-08-13 DIAGNOSIS — R001 Bradycardia, unspecified: Secondary | ICD-10-CM | POA: Insufficient documentation

## 2017-09-09 ENCOUNTER — Encounter: Payer: Self-pay | Admitting: Family

## 2017-09-09 ENCOUNTER — Ambulatory Visit: Payer: Medicare Other | Attending: Family | Admitting: Family

## 2017-09-09 ENCOUNTER — Other Ambulatory Visit: Payer: Self-pay | Admitting: Family

## 2017-09-09 VITALS — BP 149/71 | HR 69 | Resp 18 | Ht 65.0 in | Wt 313.2 lb

## 2017-09-09 DIAGNOSIS — Z794 Long term (current) use of insulin: Secondary | ICD-10-CM | POA: Insufficient documentation

## 2017-09-09 DIAGNOSIS — N183 Chronic kidney disease, stage 3 unspecified: Secondary | ICD-10-CM

## 2017-09-09 DIAGNOSIS — K219 Gastro-esophageal reflux disease without esophagitis: Secondary | ICD-10-CM | POA: Insufficient documentation

## 2017-09-09 DIAGNOSIS — E1122 Type 2 diabetes mellitus with diabetic chronic kidney disease: Secondary | ICD-10-CM | POA: Insufficient documentation

## 2017-09-09 DIAGNOSIS — Z8674 Personal history of sudden cardiac arrest: Secondary | ICD-10-CM | POA: Diagnosis not present

## 2017-09-09 DIAGNOSIS — D649 Anemia, unspecified: Secondary | ICD-10-CM | POA: Insufficient documentation

## 2017-09-09 DIAGNOSIS — F419 Anxiety disorder, unspecified: Secondary | ICD-10-CM | POA: Insufficient documentation

## 2017-09-09 DIAGNOSIS — Z79899 Other long term (current) drug therapy: Secondary | ICD-10-CM | POA: Insufficient documentation

## 2017-09-09 DIAGNOSIS — I509 Heart failure, unspecified: Secondary | ICD-10-CM | POA: Diagnosis present

## 2017-09-09 DIAGNOSIS — Z87442 Personal history of urinary calculi: Secondary | ICD-10-CM | POA: Diagnosis not present

## 2017-09-09 DIAGNOSIS — R5382 Chronic fatigue, unspecified: Secondary | ICD-10-CM

## 2017-09-09 DIAGNOSIS — G4733 Obstructive sleep apnea (adult) (pediatric): Secondary | ICD-10-CM | POA: Diagnosis not present

## 2017-09-09 DIAGNOSIS — J449 Chronic obstructive pulmonary disease, unspecified: Secondary | ICD-10-CM | POA: Insufficient documentation

## 2017-09-09 DIAGNOSIS — Z7901 Long term (current) use of anticoagulants: Secondary | ICD-10-CM | POA: Insufficient documentation

## 2017-09-09 DIAGNOSIS — N189 Chronic kidney disease, unspecified: Secondary | ICD-10-CM | POA: Insufficient documentation

## 2017-09-09 DIAGNOSIS — I4891 Unspecified atrial fibrillation: Secondary | ICD-10-CM | POA: Diagnosis not present

## 2017-09-09 DIAGNOSIS — I5033 Acute on chronic diastolic (congestive) heart failure: Secondary | ICD-10-CM

## 2017-09-09 DIAGNOSIS — I130 Hypertensive heart and chronic kidney disease with heart failure and stage 1 through stage 4 chronic kidney disease, or unspecified chronic kidney disease: Secondary | ICD-10-CM | POA: Insufficient documentation

## 2017-09-09 DIAGNOSIS — I10 Essential (primary) hypertension: Secondary | ICD-10-CM

## 2017-09-09 DIAGNOSIS — Z87891 Personal history of nicotine dependence: Secondary | ICD-10-CM | POA: Diagnosis not present

## 2017-09-09 DIAGNOSIS — E039 Hypothyroidism, unspecified: Secondary | ICD-10-CM | POA: Insufficient documentation

## 2017-09-09 LAB — BASIC METABOLIC PANEL
ANION GAP: 14 (ref 5–15)
BUN: 49 mg/dL — ABNORMAL HIGH (ref 6–20)
CHLORIDE: 87 mmol/L — AB (ref 101–111)
CO2: 37 mmol/L — AB (ref 22–32)
Calcium: 9.2 mg/dL (ref 8.9–10.3)
Creatinine, Ser: 1.65 mg/dL — ABNORMAL HIGH (ref 0.44–1.00)
GFR calc non Af Amer: 32 mL/min — ABNORMAL LOW (ref >60)
GFR, EST AFRICAN AMERICAN: 37 mL/min — AB (ref >60)
Glucose, Bld: 208 mg/dL — ABNORMAL HIGH (ref 65–99)
Potassium: 3.4 mmol/L — ABNORMAL LOW (ref 3.5–5.1)
Sodium: 138 mmol/L (ref 135–145)

## 2017-09-09 LAB — TSH: TSH: 2.733 u[IU]/mL (ref 0.350–4.500)

## 2017-09-09 MED ORDER — TORSEMIDE 20 MG PO TABS: 40.0000 mg | ORAL_TABLET | Freq: Two times a day (BID) | ORAL | 3 refills | Status: DC

## 2017-09-09 NOTE — Progress Notes (Signed)
Patient ID: Andrea Rivers, female    DOB: [birth year], 62 y.o.   MRN: [id number]  HPI  Andrea Rivers is a 62 y/o female with a history of anemia, anxiety, CKD, COPD, DM, GERD, HTN, hypothyroidism, neuropathy, obstructive sleep apnea (+CPAP), previous tobacco use and chronic heart failure.  Echo report from 7/181/8 reviewed and showed an EF of 55-60% along with mild MR. Echo done 01/26/17 shows an EF of 50-55%.   Was in the ED 04/14/17 due to HF exacerbation. Was given IV diuretic X1 and she was released. Admitted 02/22/17 due to HF exacerbation which led to cardiac arrest. Initially was intubated and received IV lasix. Pulmonology, cardiology and nephrology were consulted. Discharged to rehab after 9 days.   She presents today for an acute care visit with a chief complaint of weight gain at home. Says that her weight has been fluctuating over the last few days and she's gained >5 pounds. She had increased her furosemide on her own to 70mg  daily for the last 4 days but today she took 80mg . She's continued to take her torsemide 40mg  daily as well in addition to metolazone 5mg  daily for 2 days. Increased her potassium on her own due to increasing her diuretic. She has associated fatigue, shortness of breath, edema, chronic light-headedness and back pain. She denies any chest pain, cough, palpitations or difficulty sleeping. Recently got her 2nd shingles shot in her left upper arm and reports that it's still sore. Has increased her fluid intake because she felt like she wasn't drinking enough liquids.   Past Medical History:  Diagnosis Date  . Anemia   . Anxiety   . CHF (congestive heart failure) ([place])   . Chronic kidney disease    INSUFFICIENCY  . COPD (chronic obstructive pulmonary disease) ([place])   . Diabetes mellitus without complication ([place])   . Dyspnea    DOE  . Dysrhythmia    A FIB  . Edema   . GERD (gastroesophageal reflux disease)   . History of kidney stones   . Hypertension   .  Hypothyroidism    ABLATION  . Iron deficiency anemia 03/31/2017  . Neuropathy   . Orthopnea   . Sleep apnea    CPAP  . Vertigo   . Wheezing    Past Surgical History:  Procedure Laterality Date  . CATARACT EXTRACTION W/PHACO Left 01/26/2017   Procedure: CATARACT EXTRACTION PHACO AND INTRAOCULAR LENS PLACEMENT (IOC);  Surgeon: Estill Cotta, MD;  Location: [facility] ORS;  Service: Ophthalmology;  Laterality: Left;  [place]   1:02.2 AP     23.7 CDE   28.92 fluid pack lot# [id number] H exp.05/08/2018  . CHOLECYSTECTOMY    . TONSILLECTOMY     Family History  Problem Relation Age of Onset  . COPD Mother   . Heart disease Mother   . Anemia Mother   . Heart disease Father   . COPD Father   . Anemia Sister   . Diabetes Maternal Grandmother   . Hypertension Paternal Grandfather    Social History   Tobacco Use  . Smoking status: Former Smoker    Packs/day: 3.00    Types: Cigarettes    Last attempt to quit: 1988    Years since quitting: 31.1  . Smokeless tobacco: Never Used  Substance Use Topics  . Alcohol use: No   Allergies  Allergen Reactions  . Other Anaphylaxis    ILOSONE,  Caused GI distress.  . Exenatide Other (See Comments)    Nausea-abdominal  pain  . Rosiglitazone     Other reaction(s): Unknown   Prior to Admission medications   Medication Sig Start Date End Date Taking? Authorizing Provider  albuterol (PROAIR HFA) 108 (90 Base) MCG/ACT inhaler inhale 2 puffs by mouth INTO LUNGS every 4 to 6 hours if needed for wheezing or shortness of breath 03/14/17  Yes [provider]  amiodarone (PACERONE) 200 MG tablet Take 200 mg by mouth daily. 05/10/17  Yes [provider]  Calcium Carbonate-Vitamin D3 (CALCIUM 600-D) 600-400 MG-UNIT TABS Take 1 tablet by mouth daily.   Yes [provider]  Cholecalciferol (D3 HIGH POTENCY) 2000 units CAPS Take 1 capsule by mouth daily.   Yes [provider]  citalopram (CELEXA) 20 MG tablet Take 20 mg by mouth  daily.   Yes [provider]  cloNIDine (CATAPRES) 0.1 MG tablet Take 1 tablet (0.1 mg total) by mouth 2 (two) times daily. 07/12/17  Yes Cristyn Crossno, Otila Kluver A, FNP  co-enzyme Q-10 50 MG capsule Take 50 mg by mouth daily.   Yes [provider]  ferrous sulfate 325 (65 FE) MG tablet Take 325 mg by mouth daily with breakfast.   Yes [provider]  furosemide (LASIX) 20 MG tablet Take 1 tablet (20 mg total) by mouth daily. 08/11/17  Yes Arvell Pulsifer, Otila Kluver A, FNP  gabapentin (NEURONTIN) 100 MG capsule Take 100 mg by mouth daily. 09/05/17 09/05/18 Yes [provider]  Garlic 0174 MG CAPS Take 1 capsule by mouth daily.   Yes [provider]  glimepiride (AMARYL) 2 MG tablet Take 2 mg by mouth daily with breakfast.   Yes [provider]  HUMALOG KWIKPEN 100 UNIT/ML KiwkPen Inject 40 Units into the skin 3 (three) times daily. 03/14/17  Yes [provider]  levothyroxine (SYNTHROID, LEVOTHROID) 175 MCG tablet Take 175 mcg by mouth daily before breakfast.   Yes [provider]  linagliptin (TRADJENTA) 5 MG TABS tablet Take 5 mg by mouth daily.   Yes [provider]  lovastatin (MEVACOR) 20 MG tablet Take 20 mg by mouth at bedtime.   Yes [provider]  magnesium oxide (MAG-OX) 400 MG tablet Take 400 mg by mouth 2 (two) times daily.   Yes [provider]  meclizine (ANTIVERT) 12.5 MG tablet Take 1 tablet (12.5 mg total) by mouth 3 (three) times daily as needed for dizziness. 03/03/17  Yes Gladstone Lighter, MD  metolazone (ZAROXOLYN) 5 MG tablet Take 5 mg by mouth as needed.  04/18/17  Yes [provider]  metoprolol tartrate (LOPRESSOR) 25 MG tablet Take 25 mg by mouth 2 (two) times daily.   Yes [provider]  Omega-3 Fatty Acids (FISH OIL) 1000 MG CPDR Take 1 capsule by mouth daily.   Yes [provider]  oxyCODONE (OXY IR/ROXICODONE) 5 MG immediate release tablet Take 1 tablet (5 mg total) by mouth  every 4 (four) hours as needed for moderate pain. 03/03/17  Yes Gladstone Lighter, MD  pantoprazole (PROTONIX) 40 MG tablet Take 40 mg by mouth 2 (two) times daily.   Yes [provider]  POTASSIUM CHLORIDE CRYS ER PO Take by mouth daily. 1 OTC potassium daily   Yes [provider]  torsemide (DEMADEX) 20 MG tablet Take 40 mg by mouth daily.  03/28/17  Yes [provider]  vitamin C (ASCORBIC ACID) 500 MG tablet Take 500 mg by mouth daily.   Yes [provider]  warfarin (COUMADIN) 1 MG tablet Take 1 mg daily  at 6 PM by mouth.    Yes [provider]  warfarin (COUMADIN) 5 MG tablet Take 5 mg by mouth daily. 04/22/17  Yes [provider]   Review of Systems  Constitutional: Positive for fatigue. Negative for appetite change.  HENT: Negative for congestion, postnasal drip and sore throat.   Eyes: Positive for visual disturbance (blurry vision in left eye). Negative for pain.  Respiratory: Positive for shortness of breath. Negative for cough and chest tightness.   Cardiovascular: Positive for leg swelling. Negative for chest pain and palpitations.  Gastrointestinal: Negative for abdominal distention and abdominal pain.  Endocrine: Negative.   Genitourinary: Negative.   Musculoskeletal: Positive for arthralgias (right shoulder) and back pain.       Left upper arm sore after shingles vaccine  Skin: Positive for wound (left shin wound).  Allergic/Immunologic: Negative.   Neurological: Positive for light-headedness (due to vertigo). Negative for dizziness.  Hematological: Negative for adenopathy. Bruises/bleeds easily.  Psychiatric/Behavioral: Negative for dysphoric mood and sleep disturbance (sleeping on 6 pillows due to comfort). The patient is not nervous/anxious.    Vitals:   09/09/17 1305  BP: (!) 149/71  Pulse: 69  Resp: 18  SpO2: 100%  Weight: (!) 313 lb 4 oz (142.1 kg)  Height: 5\' 5"  (1.651 m)   Wt Readings from Last 3 Encounters:   09/09/17 (!) 313 lb 4 oz (142.1 kg)  08/11/17 (!) 309 lb 8 oz (140.4 kg)  07/21/17 (!) 308 lb 8.5 oz (139.9 kg)   Lab Results  Component Value Date   CREATININE 1.60 (H) 08/11/2017   CREATININE 1.66 (H) 04/14/2017   CREATININE 1.50 (H) 03/31/2017    Physical Exam  Constitutional: She is oriented to person, place, and time. She appears well-developed and well-nourished.  HENT:  Head: Normocephalic and atraumatic.  Neck: Normal range of motion. Neck supple. No JVD present.  Cardiovascular: Normal rate and regular rhythm.  Pulmonary/Chest: Effort normal. She has no wheezes. She has no rales.  Abdominal: Soft. She exhibits no distension. There is no tenderness.  Musculoskeletal: She exhibits edema (1+ pitting edema in bilateral ankles). She exhibits no tenderness.  Neurological: She is alert and oriented to person, place, and time.  Skin: Skin is warm and dry.  Psychiatric: She has a normal mood and affect. Her behavior is normal. Thought content normal.  Nursing note and vitals reviewed.   Assessment & Plan:  1: Acute on Chronic heart failure with preserved ejection fraction- - NYHA class II - mildly fluid overloaded today with increased edema and increased weight - weighing daily at home. Instructed to call for an overnight weight gain of >2 pounds or a weekly weight gain of >5 pounds - weight up 4 pounds since the last time she was here on 08/11/17 - not adding salt to her food. Reports that she reads food labels at home and tries to keep intake to 2000mg  sodium daily. Says that she hasn't eaten outside the home in a couple of years.  - yesterday she drank ~ 82 ounces of fluids. Discussed the importance of keeping daily fluid intake to 40-60 ounces of fluid daily - saw cardiologist Ubaldo Glassing) 09/05/17 - had been taking 70mg  furosemide for 4 days but today she increased it on her own to 80mg   - discussed my concern about taking two different diuretics consistently in addition to  metolazone. Will stop the furosemide and increase her daily torsemide to 40mg  twice daily. Can use metolazone if needed - does not elevate  her legs much during the day because she says that she's always "on the go".  - does not meet ReDS vest criteria due to BMI - will get a BMP today since she's increased her diuretic usage  2: HTN- - BP looks good today - saw PCP (Feldpausch) 05/04/17 - BMP from 08/11/17 reviewed and showed sodium 140, potassium 4.2 and GFR 33  3: Diabetes- - fasting glucose this morning at home was 136 - A1c from 06/17/17 was 7.0% - saw endocrinologist Eddie Dibbles) 06/21/17  4: Fatigue- - will check a TSH today  Patient did not bring her medications nor a list. Each medication was verbally reviewed with the patient and she was encouraged to bring the bottles to every visit to confirm accuracy of list.  Return in 1 week or sooner for any questions/problems before then.

## 2017-09-09 NOTE — Patient Instructions (Addendum)
Continue weighing daily and call for an overnight weight gain of > 2 pounds or a weekly weight gain of >5 pounds.  Stop taking the furosemide and increase torsemide to 40mg  twice daily. Take the metolazone as needed.  Will call you after getting lab results back today.

## 2017-09-10 ENCOUNTER — Telehealth: Payer: Self-pay | Admitting: Family

## 2017-09-10 NOTE — Telephone Encounter (Signed)
Spoke with patient regarding lab results that were obtained yesterday (09/09/17). Advised that her TSH level was normal, renal function was stable and potassium was slightly low.   She currently takes 99mg  potassium twice daily. Advised patient to double potassium today and she was instructed to take 2 extra potassium any time she takes metolazone. She is going to start the 40mg  torsemide twice daily today.   She says that her weight is down almost 2 pounds today. Will see her back in clinic next week.

## 2017-09-15 NOTE — Progress Notes (Signed)
Patient ID: Andrea Rivers, female    DOB: 1955-12-29, 62 y.o.   MRN: [id number]  HPI  Ms Rohm is a 62 y/o female with a history of anemia, anxiety, CKD, COPD, DM, GERD, HTN, hypothyroidism, neuropathy, obstructive sleep apnea (+CPAP), previous tobacco use and chronic heart failure.  Echo report from 7/181/8 reviewed and showed an EF of 55-60% along with mild MR. Echo done 01/26/17 shows an EF of 50-55%.   Was in the ED 04/14/17 due to HF exacerbation. Was given IV diuretic X1 and she was released. Admitted 02/22/17 due to HF exacerbation which led to cardiac arrest. Initially was intubated and received IV lasix. Pulmonology, cardiology and nephrology were consulted. Discharged to rehab after 9 days.   She presents today for a follow-up visit with a chief complaint of minimal shortness of breath upon moderate exertion. She describes this as chronic in nature having been present for several years with varying levels of severity. She does feel like her breathing has improved recently. She has associated fatigue, edema and light-headedness along with this. She denies any chest pain, palpitations, abdominal distention, difficulty sleeping or weight gain.   Past Medical History:  Diagnosis Date  . Anemia   . Anxiety   . CHF (congestive heart failure) ([place])   . Chronic kidney disease    INSUFFICIENCY  . COPD (chronic obstructive pulmonary disease) ([place])   . Diabetes mellitus without complication ([place])   . Dyspnea    DOE  . Dysrhythmia    A FIB  . Edema   . GERD (gastroesophageal reflux disease)   . History of kidney stones   . Hypertension   . Hypothyroidism    ABLATION  . Iron deficiency anemia 03/31/2017  . Neuropathy   . Orthopnea   . Sleep apnea    CPAP  . Vertigo   . Wheezing    Past Surgical History:  Procedure Laterality Date  . CATARACT EXTRACTION W/PHACO Left 01/26/2017   Procedure: CATARACT EXTRACTION PHACO AND INTRAOCULAR LENS PLACEMENT (IOC);  Surgeon: Estill Cotta, MD;  Location: [facility] ORS;  Service: Ophthalmology;  Laterality: Left;  [place]   1:02.2 AP     23.7 CDE   28.92 fluid pack lot# [id number] H exp.05/08/2018  . CHOLECYSTECTOMY    . TONSILLECTOMY     Family History  Problem Relation Age of Onset  . COPD Mother   . Heart disease Mother   . Anemia Mother   . Heart disease Father   . COPD Father   . Anemia Sister   . Diabetes Maternal Grandmother   . Hypertension Paternal Grandfather    Social History   Tobacco Use  . Smoking status: Former Smoker    Packs/day: 3.00    Types: Cigarettes    Last attempt to quit: 1988    Years since quitting: 31.1  . Smokeless tobacco: Never Used  Substance Use Topics  . Alcohol use: No   Allergies  Allergen Reactions  . Other Anaphylaxis    ILOSONE,  Caused GI distress.  . Exenatide Other (See Comments)    Nausea-abdominal pain  . Rosiglitazone     Other reaction(s): Unknown   Prior to Admission medications   Medication Sig Start Date End Date Taking? Authorizing Provider  albuterol (PROAIR HFA) 108 (90 Base) MCG/ACT inhaler inhale 2 puffs by mouth INTO LUNGS every 4 to 6 hours if needed for wheezing or shortness of breath 03/14/17  Yes [provider]  amiodarone (PACERONE) 200 MG tablet Take  200 mg by mouth daily. 05/10/17  Yes [provider]  Calcium Carbonate-Vitamin D3 (CALCIUM 600-D) 600-400 MG-UNIT TABS Take 1 tablet by mouth daily.   Yes [provider]  Cholecalciferol (D3 HIGH POTENCY) 2000 units CAPS Take 1 capsule by mouth daily.   Yes [provider]  citalopram (CELEXA) 20 MG tablet Take 20 mg by mouth daily.   Yes [provider]  cloNIDine (CATAPRES) 0.1 MG tablet Take 1 tablet (0.1 mg total) by mouth 2 (two) times daily. 07/12/17  Yes Jakita Dutkiewicz, Otila Kluver A, FNP  co-enzyme Q-10 50 MG capsule Take 50 mg by mouth daily.   Yes [provider]  ferrous sulfate 325 (65 FE) MG tablet Take 325 mg by mouth daily with breakfast.   Yes  [provider]  gabapentin (NEURONTIN) 100 MG capsule Take 100 mg by mouth daily. 09/05/17 09/05/18 Yes [provider]  Garlic 3810 MG CAPS Take 1 capsule by mouth daily.   Yes [provider]  glimepiride (AMARYL) 2 MG tablet Take 2 mg by mouth daily with breakfast.   Yes [provider]  HUMALOG KWIKPEN 100 UNIT/ML KiwkPen Inject 40 Units into the skin 3 (three) times daily. 03/14/17  Yes [provider]  levothyroxine (SYNTHROID, LEVOTHROID) 175 MCG tablet Take 175 mcg by mouth daily before breakfast.   Yes [provider]  linagliptin (TRADJENTA) 5 MG TABS tablet Take 5 mg by mouth daily.   Yes [provider]  lovastatin (MEVACOR) 20 MG tablet Take 20 mg by mouth at bedtime.   Yes [provider]  magnesium oxide (MAG-OX) 400 MG tablet Take 400 mg by mouth 2 (two) times daily.   Yes [provider]  meclizine (ANTIVERT) 12.5 MG tablet Take 1 tablet (12.5 mg total) by mouth 3 (three) times daily as needed for dizziness. 03/03/17  Yes Gladstone Lighter, MD  metolazone (ZAROXOLYN) 5 MG tablet Take 5 mg by mouth as needed.  04/18/17  Yes [provider]  metoprolol tartrate (LOPRESSOR) 25 MG tablet Take 25 mg by mouth 2 (two) times daily.   Yes [provider]  Omega-3 Fatty Acids (FISH OIL) 1000 MG CPDR Take 1 capsule by mouth daily.   Yes [provider]  oxyCODONE (OXY IR/ROXICODONE) 5 MG immediate release tablet Take 1 tablet (5 mg total) by mouth every 4 (four) hours as needed for moderate pain. 03/03/17  Yes Gladstone Lighter, MD  pantoprazole (PROTONIX) 40 MG tablet Take 40 mg by mouth 2 (two) times daily.   Yes [provider]  POTASSIUM CHLORIDE CRYS ER PO Take by mouth daily. 1 OTC potassium daily   Yes [provider]  torsemide (DEMADEX) 20 MG tablet Take 2 tablets (40 mg total) by mouth 2 (two) times daily. 09/09/17  Yes Angelos Wasco, Otila Kluver A, FNP  vitamin C (ASCORBIC  ACID) 500 MG tablet Take 500 mg by mouth daily.   Yes [provider]  warfarin (COUMADIN) 1 MG tablet Take 1 mg daily at 6 PM by mouth.    Yes [provider]  warfarin (COUMADIN) 5 MG tablet Take 5 mg by mouth daily. 04/22/17  Yes [provider]    Review of Systems  Constitutional: Positive for fatigue. Negative for appetite change.  HENT: Negative for congestion, postnasal drip and sore throat.   Eyes: Positive for visual disturbance (blurry vision in left eye). Negative for pain.  Respiratory: Positive for shortness of breath. Negative for cough and chest tightness.   Cardiovascular:  Positive for leg swelling ("better"). Negative for chest pain and palpitations.  Gastrointestinal: Negative for abdominal distention and abdominal pain.  Endocrine: Negative.   Genitourinary: Negative.   Musculoskeletal: Positive for arthralgias (right shoulder) and back pain.       Left upper arm sore after shingles vaccine  Skin: Positive for wound (left shin wound).  Allergic/Immunologic: Negative.   Neurological: Positive for light-headedness (due to vertigo). Negative for dizziness.  Hematological: Negative for adenopathy. Bruises/bleeds easily.  Psychiatric/Behavioral: Negative for dysphoric mood and sleep disturbance (sleeping on 6 pillows due to comfort). The patient is not nervous/anxious.    Vitals:   09/16/17 1339  BP: 108/70  Pulse: 66  Resp: 18  SpO2: 96%  Weight: (!) 307 lb 2 oz (139.3 kg)  Height: 5\' 5"  (1.651 m)   Wt Readings from Last 3 Encounters:  09/16/17 (!) 307 lb 2 oz (139.3 kg)  09/09/17 (!) 313 lb 4 oz (142.1 kg)  08/11/17 (!) 309 lb 8 oz (140.4 kg)   Lab Results  Component Value Date   CREATININE 1.65 (H) 09/09/2017   CREATININE 1.60 (H) 08/11/2017   CREATININE 1.66 (H) 04/14/2017   Physical Exam  Constitutional: She is oriented to person, place, and time. She appears well-developed and well-nourished.  HENT:  Head: Normocephalic and  atraumatic.  Neck: Normal range of motion. Neck supple. No JVD present.  Cardiovascular: Normal rate and regular rhythm.  Pulmonary/Chest: Effort normal. She has no wheezes. She has no rales.  Abdominal: Soft. She exhibits no distension. There is no tenderness.  Musculoskeletal: She exhibits edema (trace pitting edema in bilateral ankles). She exhibits no tenderness.  Neurological: She is alert and oriented to person, place, and time.  Skin: Skin is warm and dry.  Psychiatric: She has a normal mood and affect. Her behavior is normal. Thought content normal.  Nursing note and vitals reviewed.   Assessment & Plan:  1: Chronic heart failure with preserved ejection fraction- - NYHA class II - euvolemic today - weighing daily at home. Instructed to call for an overnight weight gain of >2 pounds or a weekly weight gain of >5 pounds - weight down 6 pounds since he was last here - not adding salt to her food. Reports that she reads food labels at home and tries to keep intake to 2000mg  sodium daily. Says that she hasn't eaten outside the home in a couple of years.  - yesterday she drank ~ 82 ounces of fluids. Discussed the importance of keeping daily fluid intake to 40-60 ounces of fluid daily - saw cardiologist Ubaldo Glassing) 09/05/17 - now taking torsemide 40mg  twice daily - says that she took metolazone twice this week - does not elevate her legs much during the day because she says that she's always "on the go".  - does not meet ReDS vest criteria due to BMI - will get a BMP today since diuretic has been changed and her potassium dosage has been increased  2: HTN- - BP looks good today - saw PCP (Feldpausch) 05/04/17 - BMP from 09/09/17 reviewed and showed sodium 138, potassium 3.4 and GFR 32  3: Diabetes- - fasting glucose this morning at home was 146 - A1c from 06/17/17 was 7.0% - saw endocrinologist Eddie Dibbles) 06/21/17  Patient did not bring her medications nor a list. Each medication was  verbally reviewed with the patient and she was encouraged to bring the bottles to every visit to confirm accuracy of list.  Return in 2 months or sooner for  any questions/problems before then.

## 2017-09-16 ENCOUNTER — Encounter: Payer: Self-pay | Admitting: Family

## 2017-09-16 ENCOUNTER — Ambulatory Visit: Payer: Medicare Other | Attending: Family | Admitting: Family

## 2017-09-16 VITALS — BP 108/70 | HR 66 | Resp 18 | Ht 65.0 in | Wt 307.1 lb

## 2017-09-16 DIAGNOSIS — Z7901 Long term (current) use of anticoagulants: Secondary | ICD-10-CM | POA: Diagnosis not present

## 2017-09-16 DIAGNOSIS — K219 Gastro-esophageal reflux disease without esophagitis: Secondary | ICD-10-CM | POA: Insufficient documentation

## 2017-09-16 DIAGNOSIS — Z794 Long term (current) use of insulin: Secondary | ICD-10-CM | POA: Insufficient documentation

## 2017-09-16 DIAGNOSIS — J449 Chronic obstructive pulmonary disease, unspecified: Secondary | ICD-10-CM | POA: Diagnosis not present

## 2017-09-16 DIAGNOSIS — N183 Chronic kidney disease, stage 3 (moderate): Secondary | ICD-10-CM

## 2017-09-16 DIAGNOSIS — Z87891 Personal history of nicotine dependence: Secondary | ICD-10-CM | POA: Insufficient documentation

## 2017-09-16 DIAGNOSIS — G4733 Obstructive sleep apnea (adult) (pediatric): Secondary | ICD-10-CM | POA: Insufficient documentation

## 2017-09-16 DIAGNOSIS — F419 Anxiety disorder, unspecified: Secondary | ICD-10-CM | POA: Insufficient documentation

## 2017-09-16 DIAGNOSIS — E1140 Type 2 diabetes mellitus with diabetic neuropathy, unspecified: Secondary | ICD-10-CM | POA: Insufficient documentation

## 2017-09-16 DIAGNOSIS — E1122 Type 2 diabetes mellitus with diabetic chronic kidney disease: Secondary | ICD-10-CM | POA: Diagnosis not present

## 2017-09-16 DIAGNOSIS — D649 Anemia, unspecified: Secondary | ICD-10-CM | POA: Diagnosis not present

## 2017-09-16 DIAGNOSIS — Z79890 Hormone replacement therapy (postmenopausal): Secondary | ICD-10-CM | POA: Diagnosis not present

## 2017-09-16 DIAGNOSIS — N189 Chronic kidney disease, unspecified: Secondary | ICD-10-CM | POA: Diagnosis not present

## 2017-09-16 DIAGNOSIS — I4891 Unspecified atrial fibrillation: Secondary | ICD-10-CM | POA: Insufficient documentation

## 2017-09-16 DIAGNOSIS — I10 Essential (primary) hypertension: Secondary | ICD-10-CM

## 2017-09-16 DIAGNOSIS — E039 Hypothyroidism, unspecified: Secondary | ICD-10-CM | POA: Insufficient documentation

## 2017-09-16 DIAGNOSIS — Z79899 Other long term (current) drug therapy: Secondary | ICD-10-CM | POA: Diagnosis not present

## 2017-09-16 DIAGNOSIS — I130 Hypertensive heart and chronic kidney disease with heart failure and stage 1 through stage 4 chronic kidney disease, or unspecified chronic kidney disease: Secondary | ICD-10-CM | POA: Insufficient documentation

## 2017-09-16 DIAGNOSIS — I5032 Chronic diastolic (congestive) heart failure: Secondary | ICD-10-CM | POA: Diagnosis not present

## 2017-09-16 NOTE — Patient Instructions (Signed)
Continue weighing daily and call for an overnight weight gain of > 2 pounds or a weekly weight gain of >5 pounds. 

## 2017-09-17 ENCOUNTER — Encounter: Payer: Self-pay | Admitting: Family

## 2017-09-21 ENCOUNTER — Encounter
Admission: RE | Admit: 2017-09-21 | Discharge: 2017-09-21 | Disposition: A | Discharge status: Home / Self Care | Payer: Medicare Other | Source: Ambulatory Visit | Attending: Family | Admitting: Family

## 2017-09-21 ENCOUNTER — Telehealth: Payer: Self-pay | Admitting: Family

## 2017-09-21 ENCOUNTER — Other Ambulatory Visit: Payer: Self-pay | Admitting: Family

## 2017-09-21 DIAGNOSIS — I5032 Chronic diastolic (congestive) heart failure: Secondary | ICD-10-CM | POA: Diagnosis present

## 2017-09-21 LAB — BASIC METABOLIC PANEL
ANION GAP: 12 (ref 5–15)
BUN: 62 mg/dL — ABNORMAL HIGH (ref 6–20)
CHLORIDE: 87 mmol/L — AB (ref 101–111)
CO2: 38 mmol/L — ABNORMAL HIGH (ref 22–32)
CREATININE: 1.85 mg/dL — AB (ref 0.44–1.00)
Calcium: 9.2 mg/dL (ref 8.9–10.3)
GFR calc non Af Amer: 28 mL/min — ABNORMAL LOW (ref >60)
GFR, EST AFRICAN AMERICAN: 33 mL/min — AB (ref >60)
Glucose, Bld: 217 mg/dL — ABNORMAL HIGH (ref 65–99)
Potassium: 3.4 mmol/L — ABNORMAL LOW (ref 3.5–5.1)
SODIUM: 137 mmol/L (ref 135–145)

## 2017-09-21 NOTE — Telephone Encounter (Signed)
Patient had lab work drawn 09/16/17 at pre-admission testing. Tube was sent to the lab and it has not been found so no results have been obtained.   Called patient to let her know that her blood would need to be drawn again. She says that she will come by today to get it drawn. Patient was understanding and I apologized for her inconvenience.

## 2017-09-22 ENCOUNTER — Telehealth: Payer: Self-pay | Admitting: Family

## 2017-09-22 NOTE — Telephone Encounter (Signed)
Spoke with patient regarding lab results obtained yesterday (09/21/17). Potassium slightly low at 3.4 and she recently took metolazone. She normally takes the 99mg  OTC potassium twice daily and when she takes the metolazone, she takes an additional 198mg  twice daily. She took the metolazone again yesterday due to overnight weight gain.   Instructed her that when she takes the metolazone she should add another 99mg  potassium tablet in addition to the extra that she's already taking.  Also explained that her renal function had declined slightly and that it should be rechecked in a couple of weeks. She says that [facility] and will see if they will recheck it. Otherwise, she sees her PCP in a couple of weeks and he can also recheck it.

## 2017-09-27 ENCOUNTER — Inpatient Hospital Stay: Admission: RE | Admit: 2017-09-27 | Payer: Medicare Other | Source: Ambulatory Visit

## 2017-09-27 ENCOUNTER — Inpatient Hospital Stay: Payer: Medicare Other | Attending: Oncology

## 2017-09-27 DIAGNOSIS — D509 Iron deficiency anemia, unspecified: Secondary | ICD-10-CM | POA: Diagnosis not present

## 2017-09-27 DIAGNOSIS — N183 Chronic kidney disease, stage 3 (moderate): Secondary | ICD-10-CM | POA: Diagnosis not present

## 2017-09-27 DIAGNOSIS — D500 Iron deficiency anemia secondary to blood loss (chronic): Secondary | ICD-10-CM

## 2017-09-27 DIAGNOSIS — D631 Anemia in chronic kidney disease: Secondary | ICD-10-CM | POA: Diagnosis present

## 2017-09-27 LAB — CBC WITH DIFFERENTIAL/PLATELET
BASOS ABS: 0.1 10*3/uL (ref 0–0.1)
Basophils Relative: 1 %
Eosinophils Absolute: 0.1 10*3/uL (ref 0–0.7)
Eosinophils Relative: 1 %
HEMATOCRIT: 37.9 % (ref 35.0–47.0)
Hemoglobin: 12.7 g/dL (ref 12.0–16.0)
LYMPHS ABS: 1.2 10*3/uL (ref 1.0–3.6)
LYMPHS PCT: 12 %
MCH: 27.5 pg (ref 26.0–34.0)
MCHC: 33.4 g/dL (ref 32.0–36.0)
MCV: 82.2 fL (ref 80.0–100.0)
MONO ABS: 0.9 10*3/uL (ref 0.2–0.9)
Monocytes Relative: 9 %
NEUTROS ABS: 7.8 10*3/uL — AB (ref 1.4–6.5)
Neutrophils Relative %: 77 %
Platelets: 255 10*3/uL (ref 150–440)
RBC: 4.61 MIL/uL (ref 3.80–5.20)
RDW: 18.2 % — AB (ref 11.5–14.5)
WBC: 10.1 10*3/uL (ref 3.6–11.0)

## 2017-09-27 LAB — IRON AND TIBC
Iron: 31 ug/dL (ref 28–170)
Saturation Ratios: 11 % (ref 10.4–31.8)
TIBC: 290 ug/dL (ref 250–450)
UIBC: 259 ug/dL

## 2017-09-27 LAB — FERRITIN: FERRITIN: 93 ng/mL (ref 11–307)

## 2017-09-29 ENCOUNTER — Encounter: Payer: Self-pay | Admitting: Oncology

## 2017-09-29 ENCOUNTER — Inpatient Hospital Stay: Payer: Medicare Other

## 2017-09-29 ENCOUNTER — Inpatient Hospital Stay (HOSPITAL_BASED_OUTPATIENT_CLINIC_OR_DEPARTMENT_OTHER): Payer: Medicare Other | Admitting: Oncology

## 2017-09-29 VITALS — BP 114/59 | HR 51 | Temp 98.5°F | Resp 18 | Ht 65.0 in | Wt 305.1 lb

## 2017-09-29 DIAGNOSIS — D509 Iron deficiency anemia, unspecified: Secondary | ICD-10-CM

## 2017-09-29 DIAGNOSIS — N183 Chronic kidney disease, stage 3 unspecified: Secondary | ICD-10-CM

## 2017-09-29 DIAGNOSIS — D500 Iron deficiency anemia secondary to blood loss (chronic): Secondary | ICD-10-CM

## 2017-09-29 DIAGNOSIS — D631 Anemia in chronic kidney disease: Secondary | ICD-10-CM | POA: Diagnosis not present

## 2017-09-29 NOTE — Progress Notes (Signed)
Hematology/Oncology Follow up visit [facility] [phone] [phone]   Patient Care Team: Sofie Hartigan, MD as PCP - General (Family Medicine) Alisa Graff, FNP as Nurse Practitioner (Family Medicine) Ubaldo Glassing Javier Docker, MD as Consulting Physician (Cardiology) Elease Etienne, MD as Consulting Physician (Endocrinology)  CHIEF COMPLAINTS/REASON FOR VISIT Follow up for treatment of Anemia    HISTORY OF PRESENTING ILLNESS:  Andrea Rivers 62 y.o.  female with past medical history listed as below who was referred by Dr. Holley Raring to me for evaluation and management of anemia.  Patient was recently hospitalized due to acute respiratory failure secondary to diastolic CHF exacerbation. She was initially admitted to ICU, intubated and required mechanical ventilation. She also had acute on chronic kidney failure. Her recent labs showed microcytic anemia with hemoglobin  6.5, MCV 67.9. She received blood transfusion on 02/24/2017. Hemoglobin improved to 8.6. Patient reports fatigue, and a lack of energy. She has some lower extremity edema. She lives at home by herself and is able to do her ADLs. She takes warfarin for A. Fib. Denies any bleeding events, blood in the stool or black stool.  INTERVAL HISTORY  Patient presents for follow up for management of anemia. Patient reports feeling well, less fatigue. No SOB, chest pain, lower extremity swelling.  Review of Systems  Constitutional: Negative for appetite change, chills and fatigue.  HENT:   Negative for hearing loss and lump/mass.   Eyes: Negative for eye problems.  Respiratory: Negative for chest tightness, cough and shortness of breath.   Cardiovascular: Negative for chest pain and leg swelling.  Gastrointestinal: Negative for abdominal distention, abdominal pain, constipation, diarrhea and nausea.  Endocrine: Negative for hot flashes.  Genitourinary: Negative for difficulty urinating,  dysuria, frequency and hematuria.   Musculoskeletal: Negative for arthralgias, back pain, gait problem and neck pain.  Skin: Negative for itching and rash.  Neurological: Negative for dizziness, gait problem and headaches.  Hematological: Negative for adenopathy. Does not bruise/bleed easily.  Psychiatric/Behavioral: Negative for confusion. The patient is not nervous/anxious.    MEDICAL HISTORY:  Past Medical History:  Diagnosis Date  . Anemia   . Anxiety   . CHF (congestive heart failure) ([place])   . Chronic kidney disease    INSUFFICIENCY  . COPD (chronic obstructive pulmonary disease) ([place])   . Diabetes mellitus without complication ([place])   . Dyspnea    DOE  . Dysrhythmia    A FIB  . Edema   . GERD (gastroesophageal reflux disease)   . History of kidney stones   . Hypertension   . Hypothyroidism    ABLATION  . Iron deficiency anemia 03/31/2017  . Neuropathy   . Orthopnea   . Sleep apnea    CPAP  . Vertigo   . Wheezing     SURGICAL HISTORY: Past Surgical History:  Procedure Laterality Date  . CATARACT EXTRACTION W/PHACO Left 01/26/2017   Procedure: CATARACT EXTRACTION PHACO AND INTRAOCULAR LENS PLACEMENT (IOC);  Surgeon: Estill Cotta, MD;  Location: [facility] ORS;  Service: Ophthalmology;  Laterality: Left;  [place]   1:02.2 AP     23.7 CDE   28.92 fluid pack lot# [id number] H exp.05/08/2018  . CHOLECYSTECTOMY    . TONSILLECTOMY      SOCIAL HISTORY: Social History   Socioeconomic History  . Marital status: Legally Separated    Spouse name: Not on file  . Number of children: Not on file  . Years of education: Not on file  . Highest  education level: Not on file  Social Needs  . Financial resource strain: Not on file  . Food insecurity - worry: Not on file  . Food insecurity - inability: Not on file  . Transportation needs - medical: Not on file  . Transportation needs - non-medical: Not on file  Occupational History  . Not on file  Tobacco Use  . Smoking status:  Former Smoker    Packs/day: 3.00    Types: Cigarettes    Last attempt to quit: 1988    Years since quitting: 31.1  . Smokeless tobacco: Never Used  Substance and Sexual Activity  . Alcohol use: No  . Drug use: No  . Sexual activity: Not on file  Other Topics Concern  . Not on file  Social History Narrative  . Not on file    FAMILY HISTORY: Family History  Problem Relation Age of Onset  . COPD Mother   . Heart disease Mother   . Anemia Mother   . Heart disease Father   . COPD Father   . Anemia Sister   . Diabetes Maternal Grandmother   . Hypertension Paternal Grandfather     ALLERGIES:  is allergic to other; exenatide; and rosiglitazone.  MEDICATIONS:  Current Outpatient Medications  Medication Sig Dispense Refill  . albuterol (PROAIR HFA) 108 (90 Base) MCG/ACT inhaler inhale 2 puffs by mouth INTO LUNGS every 4 to 6 hours if needed for wheezing or shortness of breath    . amiodarone (PACERONE) 200 MG tablet Take 200 mg by mouth daily.  0  . Calcium Carbonate-Vitamin D3 (CALCIUM 600-D) 600-400 MG-UNIT TABS Take 1 tablet by mouth daily.    . Cholecalciferol (D3 HIGH POTENCY) 2000 units CAPS Take 1 capsule by mouth daily.    . citalopram (CELEXA) 20 MG tablet Take 20 mg by mouth daily.    . cloNIDine (CATAPRES) 0.1 MG tablet Take 1 tablet (0.1 mg total) by mouth 2 (two) times daily. 60 tablet 5  . co-enzyme Q-10 50 MG capsule Take 50 mg by mouth daily.    Marland Kitchen doxycycline (VIBRAMYCIN) 100 MG capsule Take 100 mg by mouth 2 (two) times daily.    Marland Kitchen doxycycline 100 mg in sodium chloride 0.9 % 250 mL Inject 100 mg into the vein every 12 (twelve) hours.    . ferrous sulfate 325 (65 FE) MG tablet Take 325 mg by mouth daily with breakfast.    . gabapentin (NEURONTIN) 100 MG capsule Take 100 mg by mouth daily.    . Garlic 4818 MG CAPS Take 1 capsule by mouth daily.    Marland Kitchen glimepiride (AMARYL) 2 MG tablet Take 2 mg by mouth daily with breakfast.    . HUMALOG KWIKPEN 100 UNIT/ML KiwkPen  Inject 40 Units into the skin 3 (three) times daily.  0  . levothyroxine (SYNTHROID, LEVOTHROID) 175 MCG tablet Take 175 mcg by mouth daily before breakfast.    . linagliptin (TRADJENTA) 5 MG TABS tablet Take 5 mg by mouth daily.    Marland Kitchen lovastatin (MEVACOR) 20 MG tablet Take 20 mg by mouth at bedtime.    . magnesium oxide (MAG-OX) 400 MG tablet Take 400 mg by mouth 2 (two) times daily.    . meclizine (ANTIVERT) 12.5 MG tablet Take 1 tablet (12.5 mg total) by mouth 3 (three) times daily as needed for dizziness. 30 tablet 0  . metolazone (ZAROXOLYN) 5 MG tablet Take 5 mg by mouth as needed.     . metoprolol tartrate (LOPRESSOR) 25  MG tablet Take 25 mg by mouth 2 (two) times daily.    . Omega-3 Fatty Acids (FISH OIL) 1000 MG CPDR Take 1 capsule by mouth daily.    Marland Kitchen oxyCODONE (OXY IR/ROXICODONE) 5 MG immediate release tablet Take 1 tablet (5 mg total) by mouth every 4 (four) hours as needed for moderate pain. 20 tablet 0  . pantoprazole (PROTONIX) 40 MG tablet Take 40 mg by mouth 2 (two) times daily.    Marland Kitchen POTASSIUM CHLORIDE CRYS ER PO Take by mouth daily. 1 OTC potassium daily    . torsemide (DEMADEX) 20 MG tablet Take 2 tablets (40 mg total) by mouth 2 (two) times daily. 360 tablet 3  . vitamin C (ASCORBIC ACID) 500 MG tablet Take 500 mg by mouth daily.    Marland Kitchen warfarin (COUMADIN) 1 MG tablet Take 1 mg daily at 6 PM by mouth.     . warfarin (COUMADIN) 5 MG tablet Take 5 mg by mouth daily.  0   No current facility-administered medications for this visit.       Marland Kitchen  PHYSICAL EXAMINATION: ECOG PERFORMANCE STATUS: 1 - Symptomatic but completely ambulatory Vitals:   09/29/17 0948  BP: (!) 114/59  Pulse: (!) 51  Resp: 18  Temp: 98.5 F (36.9 C)   Filed Weights   09/29/17 0948  Weight: (!) 305 lb 1.9 oz (138.4 kg)   Physical Exam  Constitutional: She is oriented to person, place, and time and well-developed, well-nourished, and in no distress. No distress.  HENT:  Head: Normocephalic.    Mouth/Throat: No oropharyngeal exudate.  Eyes: Conjunctivae and EOM are normal. Pupils are equal, round, and reactive to light. Right eye exhibits no discharge. No scleral icterus.  Neck: Normal range of motion. Neck supple.  Cardiovascular: Normal rate.  No murmur heard. Pulmonary/Chest: Effort normal and breath sounds normal. No respiratory distress. She has no wheezes.  Abdominal: Soft. Bowel sounds are normal. She exhibits no distension. There is no tenderness.  Musculoskeletal: She exhibits edema.  Chronic 1+ edema,   Lymphadenopathy:    She has no cervical adenopathy.  Neurological: She is alert and oriented to person, place, and time. No cranial nerve deficit.  Skin: Skin is warm and dry. No erythema.  Bilateral LE vein insufficiency skin changes.   Psychiatric: Affect and judgment normal.     LABORATORY DATA:  I have reviewed the data as listed CBC Latest Ref Rng & Units 09/27/2017 07/20/2017 06/22/2017  WBC 3.6 - 11.0 K/uL 10.1 11.8(H) 11.5(H)  Hemoglobin 12.0 - 16.0 g/dL 12.7 11.5(L) 11.3(L)  Hematocrit 35.0 - 47.0 % 37.9 36.3 36.0  Platelets 150 - 440 K/uL 255 240 213   Recent Labs    02/25/17 0501  03/31/17 1035 04/14/17 1100 08/11/17 1100 09/09/17 1400 09/21/17 1606  NA 141   < > 139 140 140 138 137  K 4.1   < > 4.3 4.2 4.2 3.4* 3.4*  CL 100*   < > 99* 99* 93* 87* 87*  CO2 30   < > 33* 30 36* 37* 38*  GLUCOSE 175*   < > 132* 146* 181* 208* 217*  BUN 53*   < > 27* 23* 48* 49* 62*  CREATININE 1.96*   < > 1.50* 1.66* 1.60* 1.65* 1.85*  CALCIUM 8.6*   < > 8.9 9.0 9.1 9.2 9.2  GFRNONAA 26*   < > 36* 32* 33* 32* 28*  GFRAA 31*   < > 42* 37* 39* 37* 33*  PROT 7.2  --  7.8 7.7  --   --   --   ALBUMIN 3.0*  --  3.6 3.5  --   --   --   AST 23  --  17 25  --   --   --   ALT 21  --  10* 10*  --   --   --   ALKPHOS 70  --  100 98  --   --   --   BILITOT 1.2  --  0.7 0.6  --   --   --    < > = values in this interval not displayed.     Labs obtained at the  nephrologist office showed Peripheral blood Protein electrophoresis revealed no monoclonal spike. Urine protein electrophoresis negative for monoclonal spike. Hemoglobin 7.9, WBC 12.1, MCV 72, RDW 19, platelet 283,000, neutrophil 9.2, lymphocyte 1.6, monocyte 1.0, a ANA negative.  ASSESSMENT & PLAN:  1. Iron deficiency anemia due to chronic blood loss   2. CKD (chronic kidney disease) stage 3, GFR 30-59 ml/min (HCC)   3. Anemia of chronic kidney failure, stage 3 (moderate) (HCC)    Her anemia likely multifactorial from chronic kidney disease, as well as iron deficiency. Hemoglobin continue to improve.  Hold additional IV Iron.   She is on warfarin. Previously discussed with patient for colonoscopy patient declined.  # Hemoglobin improved, ABOVE 10. no need for Procrit.   The patient knows to call the clinic with any problems questions or concerns.  Return of visit: 3 months with CBC, TIBC, ferritin done one or 2 days prior to  Earlie Server, MD, PhD Hematology Oncology [facility] Pager- [phone] 09/29/2017

## 2017-09-29 NOTE — Progress Notes (Signed)
No new changes noted today 

## 2017-11-07 DIAGNOSIS — S72409A Unspecified fracture of lower end of unspecified femur, initial encounter for closed fracture: Secondary | ICD-10-CM

## 2017-11-07 HISTORY — DX: Unspecified fracture of lower end of unspecified femur, initial encounter for closed fracture: S72.409A

## 2017-11-14 ENCOUNTER — Emergency Department
Admission: EM | Admit: 2017-11-14 | Discharge: 2017-11-14 | Disposition: A | Discharge status: Other Acute Inpatient Hospital | Payer: Medicare Other | Attending: Emergency Medicine | Admitting: Emergency Medicine

## 2017-11-14 ENCOUNTER — Inpatient Hospital Stay (HOSPITAL_COMMUNITY)
Admission: AD | Admit: 2017-11-14 | Discharge: 2017-11-21 | DRG: 481 | Disposition: A | Discharge status: Skilled Nursing Facility | Payer: Medicare Other | Source: Other Acute Inpatient Hospital | Attending: Internal Medicine | Admitting: Internal Medicine

## 2017-11-14 ENCOUNTER — Inpatient Hospital Stay (HOSPITAL_COMMUNITY): Payer: Medicare Other

## 2017-11-14 ENCOUNTER — Emergency Department: Payer: Medicare Other

## 2017-11-14 DIAGNOSIS — N189 Chronic kidney disease, unspecified: Secondary | ICD-10-CM | POA: Diagnosis not present

## 2017-11-14 DIAGNOSIS — E1122 Type 2 diabetes mellitus with diabetic chronic kidney disease: Secondary | ICD-10-CM | POA: Insufficient documentation

## 2017-11-14 DIAGNOSIS — E876 Hypokalemia: Secondary | ICD-10-CM | POA: Diagnosis not present

## 2017-11-14 DIAGNOSIS — T502X5A Adverse effect of carbonic-anhydrase inhibitors, benzothiadiazides and other diuretics, initial encounter: Secondary | ICD-10-CM | POA: Diagnosis not present

## 2017-11-14 DIAGNOSIS — D509 Iron deficiency anemia, unspecified: Secondary | ICD-10-CM | POA: Diagnosis present

## 2017-11-14 DIAGNOSIS — G629 Polyneuropathy, unspecified: Secondary | ICD-10-CM

## 2017-11-14 DIAGNOSIS — K219 Gastro-esophageal reflux disease without esophagitis: Secondary | ICD-10-CM | POA: Diagnosis present

## 2017-11-14 DIAGNOSIS — E785 Hyperlipidemia, unspecified: Secondary | ICD-10-CM | POA: Diagnosis not present

## 2017-11-14 DIAGNOSIS — E119 Type 2 diabetes mellitus without complications: Secondary | ICD-10-CM

## 2017-11-14 DIAGNOSIS — E1142 Type 2 diabetes mellitus with diabetic polyneuropathy: Secondary | ICD-10-CM | POA: Diagnosis not present

## 2017-11-14 DIAGNOSIS — Z79899 Other long term (current) drug therapy: Secondary | ICD-10-CM

## 2017-11-14 DIAGNOSIS — Z794 Long term (current) use of insulin: Secondary | ICD-10-CM | POA: Insufficient documentation

## 2017-11-14 DIAGNOSIS — R791 Abnormal coagulation profile: Secondary | ICD-10-CM | POA: Diagnosis present

## 2017-11-14 DIAGNOSIS — S72462A Displaced supracondylar fracture with intracondylar extension of lower end of left femur, initial encounter for closed fracture: Secondary | ICD-10-CM | POA: Diagnosis present

## 2017-11-14 DIAGNOSIS — Z7901 Long term (current) use of anticoagulants: Secondary | ICD-10-CM | POA: Diagnosis not present

## 2017-11-14 DIAGNOSIS — E871 Hypo-osmolality and hyponatremia: Secondary | ICD-10-CM | POA: Diagnosis not present

## 2017-11-14 DIAGNOSIS — T1490XA Injury, unspecified, initial encounter: Secondary | ICD-10-CM

## 2017-11-14 DIAGNOSIS — M25562 Pain in left knee: Secondary | ICD-10-CM | POA: Diagnosis present

## 2017-11-14 DIAGNOSIS — I5032 Chronic diastolic (congestive) heart failure: Secondary | ICD-10-CM | POA: Diagnosis present

## 2017-11-14 DIAGNOSIS — D72829 Elevated white blood cell count, unspecified: Secondary | ICD-10-CM | POA: Diagnosis present

## 2017-11-14 DIAGNOSIS — T148XXA Other injury of unspecified body region, initial encounter: Secondary | ICD-10-CM | POA: Diagnosis present

## 2017-11-14 DIAGNOSIS — I481 Persistent atrial fibrillation: Secondary | ICD-10-CM | POA: Diagnosis not present

## 2017-11-14 DIAGNOSIS — S72452A Displaced supracondylar fracture without intracondylar extension of lower end of left femur, initial encounter for closed fracture: Secondary | ICD-10-CM | POA: Insufficient documentation

## 2017-11-14 DIAGNOSIS — I480 Paroxysmal atrial fibrillation: Secondary | ICD-10-CM | POA: Diagnosis not present

## 2017-11-14 DIAGNOSIS — Y9389 Activity, other specified: Secondary | ICD-10-CM | POA: Insufficient documentation

## 2017-11-14 DIAGNOSIS — Z01818 Encounter for other preprocedural examination: Secondary | ICD-10-CM

## 2017-11-14 DIAGNOSIS — F411 Generalized anxiety disorder: Secondary | ICD-10-CM

## 2017-11-14 DIAGNOSIS — Y929 Unspecified place or not applicable: Secondary | ICD-10-CM | POA: Diagnosis not present

## 2017-11-14 DIAGNOSIS — E878 Other disorders of electrolyte and fluid balance, not elsewhere classified: Secondary | ICD-10-CM | POA: Diagnosis present

## 2017-11-14 DIAGNOSIS — D62 Acute posthemorrhagic anemia: Secondary | ICD-10-CM | POA: Diagnosis not present

## 2017-11-14 DIAGNOSIS — Z6841 Body Mass Index (BMI) 40.0 and over, adult: Secondary | ICD-10-CM

## 2017-11-14 DIAGNOSIS — F419 Anxiety disorder, unspecified: Secondary | ICD-10-CM | POA: Insufficient documentation

## 2017-11-14 DIAGNOSIS — Y998 Other external cause status: Secondary | ICD-10-CM | POA: Diagnosis not present

## 2017-11-14 DIAGNOSIS — I4891 Unspecified atrial fibrillation: Secondary | ICD-10-CM

## 2017-11-14 DIAGNOSIS — G4733 Obstructive sleep apnea (adult) (pediatric): Secondary | ICD-10-CM | POA: Diagnosis present

## 2017-11-14 DIAGNOSIS — E039 Hypothyroidism, unspecified: Secondary | ICD-10-CM | POA: Diagnosis present

## 2017-11-14 DIAGNOSIS — J449 Chronic obstructive pulmonary disease, unspecified: Secondary | ICD-10-CM | POA: Diagnosis present

## 2017-11-14 DIAGNOSIS — I130 Hypertensive heart and chronic kidney disease with heart failure and stage 1 through stage 4 chronic kidney disease, or unspecified chronic kidney disease: Secondary | ICD-10-CM | POA: Diagnosis present

## 2017-11-14 DIAGNOSIS — R42 Dizziness and giddiness: Secondary | ICD-10-CM | POA: Diagnosis present

## 2017-11-14 DIAGNOSIS — S8992XA Unspecified injury of left lower leg, initial encounter: Secondary | ICD-10-CM | POA: Diagnosis present

## 2017-11-14 DIAGNOSIS — Z87891 Personal history of nicotine dependence: Secondary | ICD-10-CM

## 2017-11-14 DIAGNOSIS — Z9049 Acquired absence of other specified parts of digestive tract: Secondary | ICD-10-CM | POA: Diagnosis not present

## 2017-11-14 DIAGNOSIS — N183 Chronic kidney disease, stage 3 (moderate): Secondary | ICD-10-CM | POA: Diagnosis present

## 2017-11-14 DIAGNOSIS — I10 Essential (primary) hypertension: Secondary | ICD-10-CM | POA: Diagnosis present

## 2017-11-14 DIAGNOSIS — W010XXA Fall on same level from slipping, tripping and stumbling without subsequent striking against object, initial encounter: Secondary | ICD-10-CM | POA: Insufficient documentation

## 2017-11-14 DIAGNOSIS — Z419 Encounter for procedure for purposes other than remedying health state, unspecified: Secondary | ICD-10-CM

## 2017-11-14 HISTORY — DX: Adverse effect of unspecified anesthetic, initial encounter: T41.45XA

## 2017-11-14 HISTORY — DX: Unspecified fracture of lower end of unspecified femur, initial encounter for closed fracture: S72.409A

## 2017-11-14 HISTORY — DX: Other complications of anesthesia, initial encounter: T88.59XA

## 2017-11-14 LAB — CBC
HCT: 40.3 % (ref 35.0–47.0)
HEMATOCRIT: 38.1 % (ref 36.0–46.0)
HEMOGLOBIN: 13 g/dL (ref 12.0–16.0)
HEMOGLOBIN: 11.8 g/dL — AB (ref 12.0–15.0)
MCH: 26.9 pg (ref 26.0–34.0)
MCH: 26.7 pg (ref 26.0–34.0)
MCHC: 32.4 g/dL (ref 32.0–36.0)
MCHC: 31 g/dL (ref 30.0–36.0)
MCV: 83 fL (ref 80.0–100.0)
MCV: 86.2 fL (ref 78.0–100.0)
Platelets: 214 10*3/uL (ref 150–440)
Platelets: 229 10*3/uL (ref 150–400)
RBC: 4.85 MIL/uL (ref 3.80–5.20)
RBC: 4.42 MIL/uL (ref 3.87–5.11)
RDW: 18.6 % — ABNORMAL HIGH (ref 11.5–14.5)
RDW: 17.2 % — AB (ref 11.5–15.5)
WBC: 15.8 10*3/uL — AB (ref 3.6–11.0)
WBC: 17.8 10*3/uL — AB (ref 4.0–10.5)

## 2017-11-14 LAB — COMPREHENSIVE METABOLIC PANEL
ALBUMIN: 3.8 g/dL (ref 3.5–5.0)
ALT: 20 U/L (ref 14–54)
AST: 30 U/L (ref 15–41)
Alkaline Phosphatase: 77 U/L (ref 38–126)
Anion gap: 15 (ref 5–15)
BUN: 56 mg/dL — AB (ref 6–20)
CO2: 36 mmol/L — AB (ref 22–32)
CREATININE: 1.72 mg/dL — AB (ref 0.44–1.00)
Calcium: 9.2 mg/dL (ref 8.9–10.3)
Chloride: 87 mmol/L — ABNORMAL LOW (ref 101–111)
GFR calc Af Amer: 36 mL/min — ABNORMAL LOW (ref >60)
GFR calc non Af Amer: 31 mL/min — ABNORMAL LOW (ref >60)
GLUCOSE: 197 mg/dL — AB (ref 65–99)
Potassium: 2.6 mmol/L — CL (ref 3.5–5.1)
SODIUM: 138 mmol/L (ref 135–145)
Total Bilirubin: 0.7 mg/dL (ref 0.3–1.2)
Total Protein: 8.3 g/dL — ABNORMAL HIGH (ref 6.5–8.1)

## 2017-11-14 LAB — HEMOGLOBIN A1C
Hgb A1c MFr Bld: 7.8 % — ABNORMAL HIGH (ref 4.8–5.6)
MEAN PLASMA GLUCOSE: 177.16 mg/dL

## 2017-11-14 LAB — APTT
aPTT: 36 seconds (ref 24–36)
aPTT: 39 seconds — ABNORMAL HIGH (ref 24–36)

## 2017-11-14 LAB — BASIC METABOLIC PANEL
ANION GAP: 21 — AB (ref 5–15)
BUN: 51 mg/dL — AB (ref 6–20)
CHLORIDE: 87 mmol/L — AB (ref 101–111)
CO2: 31 mmol/L (ref 22–32)
Calcium: 9.2 mg/dL (ref 8.9–10.3)
Creatinine, Ser: 1.81 mg/dL — ABNORMAL HIGH (ref 0.44–1.00)
GFR, EST AFRICAN AMERICAN: 33 mL/min — AB (ref >60)
GFR, EST NON AFRICAN AMERICAN: 29 mL/min — AB (ref >60)
Glucose, Bld: 265 mg/dL — ABNORMAL HIGH (ref 65–99)
POTASSIUM: 2.7 mmol/L — AB (ref 3.5–5.1)
SODIUM: 139 mmol/L (ref 135–145)

## 2017-11-14 LAB — PROTIME-INR
INR: 2.29
INR: 2.29
PROTHROMBIN TIME: 25 s — AB (ref 11.4–15.2)
Prothrombin Time: 25 seconds — ABNORMAL HIGH (ref 11.4–15.2)

## 2017-11-14 LAB — TYPE AND SCREEN
ABO/RH(D): O POS
Antibody Screen: NEGATIVE

## 2017-11-14 LAB — ABO/RH: ABO/RH(D): O POS

## 2017-11-14 MED ORDER — TORSEMIDE 20 MG PO TABS: 40.0000 mg | ORAL_TABLET | Freq: Two times a day (BID) | ORAL | Status: DC

## 2017-11-14 MED ORDER — BISACODYL 5 MG PO TBEC
5.0000 mg | DELAYED_RELEASE_TABLET | Freq: Every day | ORAL | Status: DC | PRN
  Administered 2017-11-20: 5 mg via ORAL
  Filled 2017-11-14: qty 1

## 2017-11-14 MED ORDER — ALBUTEROL SULFATE (2.5 MG/3ML) 0.083% IN NEBU
2.5000 mg | INHALATION_SOLUTION | RESPIRATORY_TRACT | Status: DC | PRN
  Administered 2017-11-16: 2.5 mg via RESPIRATORY_TRACT
  Filled 2017-11-14: qty 3

## 2017-11-14 MED ORDER — POTASSIUM CHLORIDE CRYS ER 20 MEQ PO TBCR
40.0000 meq | EXTENDED_RELEASE_TABLET | Freq: Once | ORAL | Status: AC
  Administered 2017-11-14: 40 meq via ORAL
  Filled 2017-11-14: qty 2

## 2017-11-14 MED ORDER — SODIUM CHLORIDE 0.9 % IV SOLN: INTRAVENOUS | Status: DC

## 2017-11-14 MED ORDER — SODIUM CHLORIDE 0.9 % IV SOLN
INTRAVENOUS | Status: AC
  Administered 2017-11-14 – 2017-11-15 (×2): via INTRAVENOUS

## 2017-11-14 MED ORDER — MORPHINE SULFATE (PF) 4 MG/ML IV SOLN
2.0000 mg | INTRAVENOUS | Status: DC | PRN
  Administered 2017-11-17: 2 mg via INTRAVENOUS
  Filled 2017-11-14: qty 1

## 2017-11-14 MED ORDER — FERROUS SULFATE 325 (65 FE) MG PO TABS: 325.0000 mg | ORAL_TABLET | Freq: Every day | ORAL | Status: DC

## 2017-11-14 MED ORDER — AMIODARONE HCL 200 MG PO TABS
200.0000 mg | ORAL_TABLET | Freq: Every day | ORAL | Status: DC
  Administered 2017-11-14: 200 mg via ORAL
  Filled 2017-11-14 (×2): qty 1

## 2017-11-14 MED ORDER — HYDROCODONE-ACETAMINOPHEN 5-325 MG PO TABS: 1.0000 | ORAL_TABLET | Freq: Once | ORAL | Status: DC

## 2017-11-14 MED ORDER — HYDROMORPHONE HCL 1 MG/ML IJ SOLN
INTRAMUSCULAR | Status: AC
  Administered 2017-11-14: 1 mg via INTRAVENOUS
  Filled 2017-11-14: qty 1

## 2017-11-14 MED ORDER — HYDROMORPHONE HCL 1 MG/ML IJ SOLN
1.0000 mg | Freq: Once | INTRAMUSCULAR | Status: AC
  Administered 2017-11-14: 1 mg via INTRAVENOUS
  Filled 2017-11-14: qty 1

## 2017-11-14 MED ORDER — FENTANYL CITRATE (PF) 100 MCG/2ML IJ SOLN
50.0000 ug | Freq: Once | INTRAMUSCULAR | Status: AC
  Administered 2017-11-14: 50 ug via INTRAVENOUS
  Filled 2017-11-14: qty 2

## 2017-11-14 MED ORDER — POTASSIUM CHLORIDE 10 MEQ/100ML IV SOLN
10.0000 meq | Freq: Once | INTRAVENOUS | Status: AC
  Administered 2017-11-14: 10 meq via INTRAVENOUS
  Filled 2017-11-14: qty 100

## 2017-11-14 MED ORDER — HYDROCODONE-ACETAMINOPHEN 5-325 MG PO TABS
1.0000 | ORAL_TABLET | Freq: Four times a day (QID) | ORAL | Status: DC | PRN
  Administered 2017-11-14: 2 via ORAL
  Administered 2017-11-15: 1 via ORAL
  Administered 2017-11-15 (×3): 2 via ORAL
  Administered 2017-11-16: 1 via ORAL
  Administered 2017-11-16 – 2017-11-20 (×8): 2 via ORAL
  Administered 2017-11-21: 1 via ORAL
  Filled 2017-11-14 (×4): qty 2
  Filled 2017-11-14: qty 1
  Filled 2017-11-14 (×11): qty 2

## 2017-11-14 MED ORDER — CLONIDINE HCL 0.1 MG PO TABS
0.1000 mg | ORAL_TABLET | Freq: Two times a day (BID) | ORAL | Status: DC
  Administered 2017-11-14 – 2017-11-21 (×10): 0.1 mg via ORAL
  Filled 2017-11-14 (×12): qty 1

## 2017-11-14 MED ORDER — GABAPENTIN 100 MG PO CAPS
100.0000 mg | ORAL_CAPSULE | Freq: Every day | ORAL | Status: DC
  Administered 2017-11-15 (×2): 100 mg via ORAL
  Filled 2017-11-14 (×2): qty 1

## 2017-11-14 MED ORDER — HYDROMORPHONE HCL 1 MG/ML IJ SOLN
1.0000 mg | Freq: Once | INTRAMUSCULAR | Status: AC
  Administered 2017-11-14: 1 mg via INTRAVENOUS

## 2017-11-14 MED ORDER — PANTOPRAZOLE SODIUM 40 MG PO TBEC
40.0000 mg | DELAYED_RELEASE_TABLET | Freq: Two times a day (BID) | ORAL | Status: DC
  Administered 2017-11-14 – 2017-11-21 (×13): 40 mg via ORAL
  Filled 2017-11-14 (×14): qty 1

## 2017-11-14 MED ORDER — INSULIN ASPART 100 UNIT/ML ~~LOC~~ SOLN
0.0000 [IU] | Freq: Three times a day (TID) | SUBCUTANEOUS | Status: DC
  Administered 2017-11-15: 3 [IU] via SUBCUTANEOUS
  Administered 2017-11-15: 8 [IU] via SUBCUTANEOUS
  Administered 2017-11-15 – 2017-11-16 (×2): 3 [IU] via SUBCUTANEOUS
  Administered 2017-11-16: 5 [IU] via SUBCUTANEOUS
  Administered 2017-11-16: 8 [IU] via SUBCUTANEOUS
  Administered 2017-11-17: 11 [IU] via SUBCUTANEOUS
  Administered 2017-11-18: 5 [IU] via SUBCUTANEOUS
  Administered 2017-11-18: 11 [IU] via SUBCUTANEOUS
  Administered 2017-11-18 – 2017-11-19 (×2): 5 [IU] via SUBCUTANEOUS

## 2017-11-14 MED ORDER — OXYCODONE HCL 5 MG PO TABS
5.0000 mg | ORAL_TABLET | ORAL | Status: DC | PRN
  Administered 2017-11-14: 10 mg via ORAL
  Administered 2017-11-15: 5 mg via ORAL
  Administered 2017-11-15 – 2017-11-20 (×16): 10 mg via ORAL
  Filled 2017-11-14 (×17): qty 2

## 2017-11-14 MED ORDER — SENNOSIDES-DOCUSATE SODIUM 8.6-50 MG PO TABS
1.0000 | ORAL_TABLET | Freq: Every evening | ORAL | Status: DC | PRN
  Administered 2017-11-18: 1 via ORAL
  Filled 2017-11-14: qty 1

## 2017-11-14 MED ORDER — FLEET ENEMA 7-19 GM/118ML RE ENEM
1.0000 | ENEMA | Freq: Once | RECTAL | Status: DC | PRN
  Filled 2017-11-14: qty 1

## 2017-11-14 MED ORDER — MECLIZINE HCL 12.5 MG PO TABS
12.5000 mg | ORAL_TABLET | Freq: Three times a day (TID) | ORAL | Status: DC | PRN
  Administered 2017-11-17 – 2017-11-20 (×4): 12.5 mg via ORAL
  Filled 2017-11-14 (×5): qty 1

## 2017-11-14 MED ORDER — POTASSIUM CHLORIDE 10 MEQ/100ML IV SOLN
10.0000 meq | INTRAVENOUS | Status: AC
  Administered 2017-11-14 (×2): 10 meq via INTRAVENOUS
  Filled 2017-11-14 (×2): qty 100

## 2017-11-14 MED ORDER — AMIODARONE HCL 200 MG PO TABS
200.0000 mg | ORAL_TABLET | Freq: Every day | ORAL | Status: DC
  Administered 2017-11-15 – 2017-11-16 (×2): 200 mg via ORAL
  Filled 2017-11-14 (×2): qty 1

## 2017-11-14 MED ORDER — METOPROLOL TARTRATE 25 MG PO TABS: 25.0000 mg | ORAL_TABLET | Freq: Two times a day (BID) | ORAL | Status: DC

## 2017-11-14 MED ORDER — LEVOTHYROXINE SODIUM 75 MCG PO TABS
175.0000 ug | ORAL_TABLET | Freq: Every day | ORAL | Status: DC
  Administered 2017-11-14 – 2017-11-21 (×6): 175 ug via ORAL
  Filled 2017-11-14 (×7): qty 1

## 2017-11-14 MED ORDER — PRAVASTATIN SODIUM 10 MG PO TABS
20.0000 mg | ORAL_TABLET | Freq: Every day | ORAL | Status: DC
  Administered 2017-11-14 – 2017-11-21 (×8): 20 mg via ORAL
  Filled 2017-11-14 (×8): qty 2

## 2017-11-14 MED ORDER — CITALOPRAM HYDROBROMIDE 20 MG PO TABS
20.0000 mg | ORAL_TABLET | Freq: Every day | ORAL | Status: DC
  Administered 2017-11-14 – 2017-11-21 (×7): 20 mg via ORAL
  Filled 2017-11-14 (×7): qty 1

## 2017-11-14 MED ORDER — MORPHINE SULFATE (PF) 4 MG/ML IV SOLN
1.0000 mg | INTRAVENOUS | Status: DC | PRN
  Administered 2017-11-14: 1 mg via INTRAVENOUS
  Filled 2017-11-14: qty 1

## 2017-11-14 NOTE — ED Triage Notes (Signed)
Pt arrives via [facility] from home post fall. PT states she fell approx [phone] this morning. Pt did not hit head pt fell directly on left knee. Pt presents with swollen hematoma and deformities on the left knee. Pt is A/O vss NAD

## 2017-11-14 NOTE — H&P (Signed)
History and Physical    DEALVA LAFOY TDV:[id number] DOB: 07-19-1956 DOA: 11/14/2017   PCP: Sofie Hartigan, MD   Patient coming from:  Home    Chief Complaint: Fall   HPI: Andrea Rivers is a 62 y.o. female with extensive medical history listed below, including diabetes with neuropathy, CKD, COPD, hypertension, hyperlipidemia, sleep apnea on CPAP, GERD, atrial fibrillation, CHF, anxiety, anemia, hypothyroidism, brought from [facility] to [facility], after suffering comminuted fracture of the distal femur, supracondylar, with displacement. In review, she presented with left knee pain after tripping falling on her left knee, appearing to be mechanical.  She denies any other injuries.  The patient complained immediately of severe pain and swelling on the left knee.  The pain was worse with any movement.  She denies any dizziness. No loss of consciousness.  She denies any nausea or vomiting.  She denies any abdominal pain, she has chronic lower extremity swelling, and has not been very mobile over the last few weeks.  No confusion is reported.  Patient has chronic vision changes especially in the left eye as well as history of vertigo, which may have contributed to some of her balance, and fall.  ED Course:  BP (!) 128/53 (BP Location: Left Arm)   Pulse 79   Temp 98.2 F (36.8 C) (Oral)   Resp 16   SpO2 92%   Potassium 2.6, replenished at [place] INR 2.29 Glucose 197 Cr 1.72 White count 15.8 On presentation, she has significant hematoma on the left knee, and x-ray confirmed a comminuted fracture of the distal femur.  This was discussed with orthopedics at the facility, and he explained that because of the extension into the joint, transfer was recommended.  At this facility, Dr. Marcelino Scot also of orthopedics is to see the patient, recommending admission to the hospitalist service, with consultation to him. Patient also received IV Dilaudid at the facility, with good control of  her pain.  Review of Systems:  As per HPI otherwise all other systems reviewed and are negative  Past Medical History:  Diagnosis Date  . Anemia   . Anxiety   . CHF (congestive heart failure) ([place])   . Chronic kidney disease    INSUFFICIENCY  . COPD (chronic obstructive pulmonary disease) ([place])   . Diabetes mellitus without complication ([place])   . Dyspnea    DOE  . Dysrhythmia    A FIB  . Edema   . GERD (gastroesophageal reflux disease)   . History of kidney stones   . Hypertension   . Hypothyroidism    ABLATION  . Iron deficiency anemia 03/31/2017  . Neuropathy   . Orthopnea   . Sleep apnea    CPAP  . Vertigo   . Wheezing     Past Surgical History:  Procedure Laterality Date  . CATARACT EXTRACTION W/PHACO Left 01/26/2017   Procedure: CATARACT EXTRACTION PHACO AND INTRAOCULAR LENS PLACEMENT (IOC);  Surgeon: Estill Cotta, MD;  Location: [facility] ORS;  Service: Ophthalmology;  Laterality: Left;  [place]   1:02.2 AP     23.7 CDE   28.92 fluid pack lot# [id number] H exp.05/08/2018  . CHOLECYSTECTOMY    . TONSILLECTOMY      Social History Social History   Socioeconomic History  . Marital status: Married    Spouse name: Not on file  . Number of children: Not on file  . Years of education: Not on file  . Highest education level: Not on file  Occupational History  .  Not on file  Social Needs  . Financial resource strain: Not on file  . Food insecurity:    Worry: Not on file    Inability: Not on file  . Transportation needs:    Medical: Not on file    Non-medical: Not on file  Tobacco Use  . Smoking status: Former Smoker    Packs/day: 3.00    Types: Cigarettes    Last attempt to quit: 1988    Years since quitting: 31.2  . Smokeless tobacco: Never Used  Substance and Sexual Activity  . Alcohol use: No  . Drug use: No  . Sexual activity: Not on file  Lifestyle  . Physical activity:    Days per week: Not on file    Minutes per session: Not on file  . Stress:  Not on file  Relationships  . Social connections:    Talks on phone: Not on file    Gets together: Not on file    Attends religious service: Not on file    Active member of club or organization: Not on file    Attends meetings of clubs or organizations: Not on file    Relationship status: Not on file  . Intimate partner violence:    Fear of current or ex partner: Not on file    Emotionally abused: Not on file    Physically abused: Not on file    Forced sexual activity: Not on file  Other Topics Concern  . Not on file  Social History Narrative  . Not on file     Allergies  Allergen Reactions  . Other Anaphylaxis    ILOSONE,  Caused GI distress.  . Exenatide Other (See Comments)    Nausea-abdominal pain  . Latex Hives  . Rosiglitazone     Other reaction(s): Unknown    Family History  Problem Relation Age of Onset  . COPD Mother   . Heart disease Mother   . Anemia Mother   . Heart disease Father   . COPD Father   . Anemia Sister   . Diabetes Maternal Grandmother   . Hypertension Paternal Grandfather       Prior to Admission medications   Medication Sig Start Date End Date Taking? Authorizing Provider  albuterol (PROAIR HFA) 108 (90 Base) MCG/ACT inhaler inhale 2 puffs by mouth INTO LUNGS every 4 to 6 hours if needed for wheezing or shortness of breath 03/14/17   [provider]  amiodarone (PACERONE) 200 MG tablet Take 200 mg by mouth daily. 05/10/17   [provider]  Calcium Carbonate-Vitamin D3 (CALCIUM 600-D) 600-400 MG-UNIT TABS Take 1 tablet by mouth daily.    [provider]  Cholecalciferol (D3 HIGH POTENCY) 2000 units CAPS Take 1 capsule by mouth daily.    [provider]  citalopram (CELEXA) 20 MG tablet Take 20 mg by mouth daily.    [provider]  cloNIDine (CATAPRES) 0.1 MG tablet Take 1 tablet (0.1 mg total) by mouth 2 (two) times daily. 07/12/17   Alisa Graff, FNP  co-enzyme Q-10 50 MG capsule Take 50 mg by  mouth daily.    [provider]  doxycycline (VIBRAMYCIN) 100 MG capsule Take 100 mg by mouth 2 (two) times daily.    [provider]  doxycycline 100 mg in sodium chloride 0.9 % 250 mL Inject 100 mg into the vein every 12 (twelve) hours.    [provider]  ferrous sulfate 325 (65 FE) MG tablet Take  325 mg by mouth daily with breakfast.    [provider]  gabapentin (NEURONTIN) 100 MG capsule Take 100 mg by mouth daily. 09/05/17 09/05/18  [provider]  Garlic 6440 MG CAPS Take 1 capsule by mouth daily.    [provider]  glimepiride (AMARYL) 2 MG tablet Take 2 mg by mouth daily with breakfast.    [provider]  HUMALOG KWIKPEN 100 UNIT/ML KiwkPen Inject 40 Units into the skin 3 (three) times daily. 03/14/17   [provider]  levothyroxine (SYNTHROID, LEVOTHROID) 175 MCG tablet Take 175 mcg by mouth daily before breakfast.    [provider]  linagliptin (TRADJENTA) 5 MG TABS tablet Take 5 mg by mouth daily.    [provider]  lovastatin (MEVACOR) 20 MG tablet Take 20 mg by mouth at bedtime.    [provider]  magnesium oxide (MAG-OX) 400 MG tablet Take 400 mg by mouth 2 (two) times daily.    [provider]  meclizine (ANTIVERT) 12.5 MG tablet Take 1 tablet (12.5 mg total) by mouth 3 (three) times daily as needed for dizziness. 03/03/17   Gladstone Lighter, MD  metolazone (ZAROXOLYN) 5 MG tablet Take 5 mg by mouth as needed.  04/18/17   [provider]  metoprolol tartrate (LOPRESSOR) 25 MG tablet Take 25 mg by mouth 2 (two) times daily.    [provider]  Omega-3 Fatty Acids (FISH OIL) 1000 MG CPDR Take 1 capsule by mouth daily.    [provider]  oxyCODONE (OXY IR/ROXICODONE) 5 MG immediate release tablet Take 1 tablet (5 mg total) by mouth every 4 (four) hours as needed for moderate pain. 03/03/17   Gladstone Lighter, MD  pantoprazole (PROTONIX) 40 MG  tablet Take 40 mg by mouth 2 (two) times daily.    [provider]  POTASSIUM CHLORIDE CRYS ER PO Take by mouth daily. 1 OTC potassium daily    [provider]  torsemide (DEMADEX) 20 MG tablet Take 2 tablets (40 mg total) by mouth 2 (two) times daily. 09/09/17   Alisa Graff, FNP  vitamin C (ASCORBIC ACID) 500 MG tablet Take 500 mg by mouth daily.    [provider]  warfarin (COUMADIN) 1 MG tablet Take 1 mg daily at 6 PM by mouth.     [provider]  warfarin (COUMADIN) 5 MG tablet Take 5 mg by mouth daily. 04/22/17   [provider]    Physical Exam:  Vitals:   11/14/17 1657  BP: (!) 128/53  Pulse: 79  Resp: 16  Temp: 98.2 F (36.8 C)  TempSrc: Oral  SpO2: 92%   Constitutional: NAD, calm, appears uncomfortable due to pain. Eyes: PERRL, lids and conjunctivae normal ENMT: Mucous membranes are moist, without exudate or lesions  Neck: normal, supple, no masses, no thyromegaly Respiratory: clear to auscultation bilaterally, no wheezing, no crackles. Normal respiratory effort  Cardiovascular: Regular rate and rhythm, distant sounds, cannot appreciate murmur rubs or gallops.  Bilateral 1+ lower extremity extremity edema. 2+ pedal pulses. No carotid bruits.  Abdomen: Soft, morbidly obese non tender, No hepatosplenomegaly. Bowel sounds positive.  Other findings may be masked by her body habitus Musculoskeletal: no clubbing / cyanosis patient has a large hematoma anterolaterally per chart report, as the patient has a bandage at the time, however these hematoma which may limit her range of motion along with fracture on the left hip.  Area is immobilized Skin: no jaundice, No lesions.  No other  mottling area Neurologic: Sensation intact  Strength equal in all extremities Psychiatric:   Alert and oriented x 3.  Anxious mood.     Labs on Admission: I have personally reviewed following labs and imaging studies  CBC: Recent Labs  Lab  11/14/17 1158  WBC 15.8*  HGB 13.0  HCT 40.3  MCV 83.0  PLT 400    Basic Metabolic Panel: Recent Labs  Lab 11/14/17 1158  NA 138  K 2.6*  CL 87*  CO2 36*  GLUCOSE 197*  BUN 56*  CREATININE 1.72*  CALCIUM 9.2    GFR: Estimated Creatinine Clearance: 47.9 mL/min (A) (by C-G formula based on SCr of 1.72 mg/dL (H)).  Liver Function Tests: Recent Labs  Lab 11/14/17 1158  AST 30  ALT 20  ALKPHOS 77  BILITOT 0.7  PROT 8.3*  ALBUMIN 3.8   No results for input(s): LIPASE, AMYLASE in the last 168 hours. No results for input(s): AMMONIA in the last 168 hours.  Coagulation Profile: Recent Labs  Lab 11/14/17 1158  INR 2.29    Cardiac Enzymes: No results for input(s): CKTOTAL, CKMB, CKMBINDEX, TROPONINI in the last 168 hours.  BNP (last 3 results) No results for input(s): PROBNP in the last 8760 hours.  HbA1C: No results for input(s): HGBA1C in the last 72 hours.  CBG: No results for input(s): GLUCAP in the last 168 hours.  Lipid Profile: No results for input(s): CHOL, HDL, LDLCALC, TRIG, CHOLHDL, LDLDIRECT in the last 72 hours.  Thyroid Function Tests: No results for input(s): TSH, T4TOTAL, FREET4, T3FREE, THYROIDAB in the last 72 hours.  Anemia Panel: No results for input(s): VITAMINB12, FOLATE, FERRITIN, TIBC, IRON, RETICCTPCT in the last 72 hours.  Urine analysis:    Component Value Date/Time   COLORURINE STRAW (A) 05/19/2017 1005   APPEARANCEUR CLEAR 05/19/2017 1005   LABSPEC 1.015 05/19/2017 1005   PHURINE 7.0 05/19/2017 1005   GLUCOSEU NEGATIVE 05/19/2017 1005   HGBUR NEGATIVE 05/19/2017 1005   BILIRUBINUR NEGATIVE 05/19/2017 1005   KETONESUR NEGATIVE 05/19/2017 1005   PROTEINUR NEGATIVE 05/19/2017 1005   NITRITE NEGATIVE 05/19/2017 1005   LEUKOCYTESUR TRACE (A) 05/19/2017 1005    Sepsis Labs: @LABRCNTIP (procalcitonin:4,lacticidven:4) )No results found for this or any previous visit (from the past 240 hour(s)).   Radiological Exams on  Admission: Dg Knee Complete 4 Views Left  Result Date: 11/14/2017 CLINICAL DATA:  Post fall.  Fell directly on LEFT knee.  Pain. EXAM: LEFT KNEE - COMPLETE 4+ VIEW COMPARISON:  None. FINDINGS: There is a comminuted fracture of the distal femur, supracondylar, with displacement. Soft tissue swelling. Joint effusion. Anterior displacement of the femoral shaft on the distal femur/condyles. No knee dislocation. Soft tissue calcification is noted above and below the joint, medially. IMPRESSION: Comminuted fracture of the distal femur, supracondylar, with anterior displacement. Soft tissue swelling with joint effusion. Electronically Signed   By: Staci Righter M.D.   On: 11/14/2017 11:07    EKG: Independently reviewed.  Assessment/Plan Active Problems:   Fracture   Chronic diastolic heart failure (HCC)   HTN (hypertension)   Diabetes (HCC)   Obstructive sleep apnea   Iron deficiency anemia   CKD (chronic kidney disease)   Hypothyroidism   Atrial fibrillation (HCC)   Anxiety state   Peripheral neuropathy      Left closed supracondylar left femoral fracture Received  IV pain meds, immobilized.  Workup is ongoing . admit to med surg, anticipating surgery as per Ortho Hip fracture order set NPO for now,  if the patient is not to have surgery today, will feed SCDs overnight, Coumadin to start post op (as per Ortho) Pain control with morphine, and narcotics CXR and EKG prior to surgery PT/OT consult postop  Hypokalemia, may be due to diuretics.  Potassium at aliments was 2.6, apparently has been replenished, although no values are available for review.  New BMET is ordered Pending on the results, we will continue to replenish. Hold diuretics today.    Type II Diabetes with Neuropathy Current blood sugar level is 197 No results found for: HGBA1C Hgb A1C Hold home oral diabetic medications.   SSI Continue Neurontin  Hypertension BP (!) 128/53 (BP Location: Left Arm)   Pulse 79   Temp  98.2 F (36.8 C) (Oral)   Resp 16   SpO2 92%  Controlled Continue home anti-hypertensive medications   Hyperlipidemia Continue home statins  Atrial Fibrillation on anticoagulation with Coumadin. INR 2.3   Rate controlled. New EKG pending  Continue meds Pacerone  Hold Coumadin   History of vertigo, no acute issues Continue Antivert prn   Chronic kidney disease stage    baseline creatinine  1.6-1.8 at baseline  Lab Results  Component Value Date   CREATININE 1.72 (H) 11/14/2017   CREATININE 1.85 (H) 09/21/2017   CREATININE 1.65 (H) 09/09/2017  IVF Hold diuretics Repeat BMET in am  Hold NSAIDS  Leukocytosis, likely reactive, related to  Pain, inflammation . WBC 15   Afebrile   Repeat CBC in AM Gentle hydration  Blood cultures, UA pending    GERD, no acute symptoms Continue PPI   Hypothyroidism: Continue home Synthroid   Depression Continue home  Celexa   DVT prophylaxis:  SCD, Coumadin will be placed on hold Code Status:    Full Family Communication:  Discussed with patient Disposition Plan: Expect patient to be discharged to home after condition improves Consults called:    Orthopedics Admission status: Inpatient MedSurg   Sharene Butters, PA-C [facility]   Amion text  [id number]   11/14/2017, 6:21 PM

## 2017-11-14 NOTE — ED Notes (Signed)
Splint applied for comfort in transport.

## 2017-11-14 NOTE — Progress Notes (Deleted)
Patient ID: Andrea Rivers, female    DOB: April 27, 1956, 62 y.o.   MRN: [id number]  HPI  Andrea Rivers is a 62 y/o female with a history of anemia, anxiety, CKD, COPD, DM, GERD, HTN, hypothyroidism, neuropathy, obstructive sleep apnea (+CPAP), previous tobacco use and chronic heart failure.  Echo report from 02/23/17 reviewed and showed an EF of 55-60% along with mild MR. Echo done 01/26/17 shows an EF of 50-55%.   Was in the ED 11/14/17 due to fall.   She presents today for a follow-up visit with a chief complaint of   Past Medical History:  Diagnosis Date  . Anemia   . Anxiety   . CHF (congestive heart failure) ([place])   . Chronic kidney disease    INSUFFICIENCY  . COPD (chronic obstructive pulmonary disease) ([place])   . Diabetes mellitus without complication ([place])   . Dyspnea    DOE  . Dysrhythmia    A FIB  . Edema   . GERD (gastroesophageal reflux disease)   . History of kidney stones   . Hypertension   . Hypothyroidism    ABLATION  . Iron deficiency anemia 03/31/2017  . Neuropathy   . Orthopnea   . Sleep apnea    CPAP  . Vertigo   . Wheezing    Past Surgical History:  Procedure Laterality Date  . CATARACT EXTRACTION W/PHACO Left 01/26/2017   Procedure: CATARACT EXTRACTION PHACO AND INTRAOCULAR LENS PLACEMENT (IOC);  Surgeon: Estill Cotta, MD;  Location: [facility] ORS;  Service: Ophthalmology;  Laterality: Left;  [place]   1:02.2 AP     23.7 CDE   28.92 fluid pack lot# [id number] H exp.05/08/2018  . CHOLECYSTECTOMY    . TONSILLECTOMY     Family History  Problem Relation Age of Onset  . COPD Mother   . Heart disease Mother   . Anemia Mother   . Heart disease Father   . COPD Father   . Anemia Sister   . Diabetes Maternal Grandmother   . Hypertension Paternal Grandfather    Social History   Tobacco Use  . Smoking status: Former Smoker    Packs/day: 3.00    Types: Cigarettes    Last attempt to quit: 1988    Years since quitting: 31.2  . Smokeless tobacco: Never Used   Substance Use Topics  . Alcohol use: No   Allergies  Allergen Reactions  . Other Anaphylaxis    ILOSONE,  Caused GI distress.  . Exenatide Other (See Comments)    Nausea-abdominal pain  . Rosiglitazone     Other reaction(s): Unknown     Review of Systems  Constitutional: Positive for fatigue. Negative for appetite change.  HENT: Negative for congestion, postnasal drip and sore throat.   Eyes: Positive for visual disturbance (blurry vision in left eye). Negative for pain.  Respiratory: Positive for shortness of breath. Negative for cough and chest tightness.   Cardiovascular: Positive for leg swelling ("better"). Negative for chest pain and palpitations.  Gastrointestinal: Negative for abdominal distention and abdominal pain.  Endocrine: Negative.   Genitourinary: Negative.   Musculoskeletal: Positive for arthralgias (right shoulder) and back pain.       Left upper arm sore after shingles vaccine  Skin: Positive for wound (left shin wound).  Allergic/Immunologic: Negative.   Neurological: Positive for light-headedness (due to vertigo). Negative for dizziness.  Hematological: Negative for adenopathy. Bruises/bleeds easily.  Psychiatric/Behavioral: Negative for dysphoric mood and sleep disturbance (sleeping on 6 pillows due to comfort). The patient  is not nervous/anxious.     Physical Exam  Constitutional: She is oriented to person, place, and time. She appears well-developed and well-nourished.  HENT:  Head: Normocephalic and atraumatic.  Neck: Normal range of motion. Neck supple. No JVD present.  Cardiovascular: Normal rate and regular rhythm.  Pulmonary/Chest: Effort normal. She has no wheezes. She has no rales.  Abdominal: Soft. She exhibits no distension. There is no tenderness.  Musculoskeletal: She exhibits edema (trace pitting edema in bilateral ankles). She exhibits no tenderness.  Neurological: She is alert and oriented to person, place, and time.  Skin: Skin is  warm and dry.  Psychiatric: She has a normal mood and affect. Her behavior is normal. Thought content normal.  Nursing note and vitals reviewed.   Assessment & Plan:  1: Chronic heart failure with preserved ejection fraction- - NYHA class II - euvolemic today - weighing daily at home. Reminded to call for an overnight weight gain of >2 pounds or a weekly weight gain of >5 pounds - weight down 6 pounds since he was last here - not adding salt to her food. Reports that she reads food labels at home and tries to keep intake to 2000mg  sodium daily. Says that she hasn't eaten outside the home in a couple of years.  - yesterday she drank ~ 82 ounces of fluids. Discussed the importance of keeping daily fluid intake to 40-60 ounces of fluid daily - saw cardiologist Ubaldo Glassing) 09/05/17 - now taking torsemide 40mg  twice daily - says that she took metolazone twice this week - does not elevate her legs much during the day because she says that she's always "on the go".  - does not meet ReDS vest criteria due to BMI -   2: HTN- - BP looks good today - saw PCP (Feldpausch) 11/02/17 - BMP from 11/03/17 [facility]) reviewed and showed sodium 141, potassium 2.8 and GFR 27  3: Diabetes- - fasting glucose this morning at home was  - A1c from 09/20/17 was 7.3% - saw endocrinologist Eddie Dibbles) 06/21/17  Patient did not bring her medications nor a list. Each medication was verbally reviewed with the patient and she was encouraged to bring the bottles to every visit to confirm accuracy of list.

## 2017-11-14 NOTE — ED Provider Notes (Signed)
[facility] Emergency Department Provider Note   ____________________________________________    I have reviewed the triage vital signs and the nursing notes.   HISTORY  Chief Complaint Fall     HPI Andrea Rivers is a 62 y.o. female presents with complaints of left knee pain.  Patient reports she tripped prior to arrival and fell onto her left knee.  Denies other injuries.  She complains of severe pain and swelling in the left knee.  Pain is worse with any movement.  Has not taken anything for this.  Denies dizziness.  Reports she has a history of CHF diabetes and kidney disease.  No abdominal pain nausea or vomiting.   Past Medical History:  Diagnosis Date  . Anemia   . Anxiety   . CHF (congestive heart failure) ([place])   . Chronic kidney disease    INSUFFICIENCY  . COPD (chronic obstructive pulmonary disease) ([place])   . Diabetes mellitus without complication ([place])   . Dyspnea    DOE  . Dysrhythmia    A FIB  . Edema   . GERD (gastroesophageal reflux disease)   . History of kidney stones   . Hypertension   . Hypothyroidism    ABLATION  . Iron deficiency anemia 03/31/2017  . Neuropathy   . Orthopnea   . Sleep apnea    CPAP  . Vertigo   . Wheezing     Patient Active Problem List   Diagnosis Date Noted  . Bradycardia 08/13/2017  . Microcytic anemia 03/31/2017  . Iron deficiency anemia 03/31/2017  . Chronic diastolic heart failure ([place]) 03/10/2017  . HTN (hypertension) 03/10/2017  . Diabetes ([place]) 03/10/2017  . Obstructive sleep apnea 03/10/2017  . Shock [facility])     Past Surgical History:  Procedure Laterality Date  . CATARACT EXTRACTION W/PHACO Left 01/26/2017   Procedure: CATARACT EXTRACTION PHACO AND INTRAOCULAR LENS PLACEMENT (IOC);  Surgeon: Estill Cotta, MD;  Location: [facility] ORS;  Service: Ophthalmology;  Laterality: Left;  [place]   1:02.2 AP     23.7 CDE   28.92 fluid pack lot# [id number] H exp.05/08/2018  . CHOLECYSTECTOMY     . TONSILLECTOMY      Prior to Admission medications   Medication Sig Start Date End Date Taking? Authorizing Provider  albuterol (PROAIR HFA) 108 (90 Base) MCG/ACT inhaler inhale 2 puffs by mouth INTO LUNGS every 4 to 6 hours if needed for wheezing or shortness of breath 03/14/17   [provider]  amiodarone (PACERONE) 200 MG tablet Take 200 mg by mouth daily. 05/10/17   [provider]  Calcium Carbonate-Vitamin D3 (CALCIUM 600-D) 600-400 MG-UNIT TABS Take 1 tablet by mouth daily.    [provider]  Cholecalciferol (D3 HIGH POTENCY) 2000 units CAPS Take 1 capsule by mouth daily.    [provider]  citalopram (CELEXA) 20 MG tablet Take 20 mg by mouth daily.    [provider]  cloNIDine (CATAPRES) 0.1 MG tablet Take 1 tablet (0.1 mg total) by mouth 2 (two) times daily. 07/12/17   Alisa Graff, FNP  co-enzyme Q-10 50 MG capsule Take 50 mg by mouth daily.    [provider]  doxycycline (VIBRAMYCIN) 100 MG capsule Take 100 mg by mouth 2 (two) times daily.    [provider]  doxycycline 100 mg in sodium chloride 0.9 % 250 mL Inject 100 mg into the vein every 12 (twelve) hours.    [provider]  ferrous sulfate 325 (65  FE) MG tablet Take 325 mg by mouth daily with breakfast.    [provider]  gabapentin (NEURONTIN) 100 MG capsule Take 100 mg by mouth daily. 09/05/17 09/05/18  [provider]  Garlic 1610 MG CAPS Take 1 capsule by mouth daily.    [provider]  glimepiride (AMARYL) 2 MG tablet Take 2 mg by mouth daily with breakfast.    [provider]  HUMALOG KWIKPEN 100 UNIT/ML KiwkPen Inject 40 Units into the skin 3 (three) times daily. 03/14/17   [provider]  levothyroxine (SYNTHROID, LEVOTHROID) 175 MCG tablet Take 175 mcg by mouth daily before breakfast.    [provider]  linagliptin (TRADJENTA) 5 MG TABS tablet Take 5 mg by mouth daily.    [provider]  lovastatin (MEVACOR) 20 MG tablet Take 20 mg by mouth at bedtime.    [provider]  magnesium oxide (MAG-OX) 400 MG tablet Take 400 mg by mouth 2 (two) times daily.    [provider]  meclizine (ANTIVERT) 12.5 MG tablet Take 1 tablet (12.5 mg total) by mouth 3 (three) times daily as needed for dizziness. 03/03/17   Gladstone Lighter, MD  metolazone (ZAROXOLYN) 5 MG tablet Take 5 mg by mouth as needed.  04/18/17   [provider]  metoprolol tartrate (LOPRESSOR) 25 MG tablet Take 25 mg by mouth 2 (two) times daily.    [provider]  Omega-3 Fatty Acids (FISH OIL) 1000 MG CPDR Take 1 capsule by mouth daily.    [provider]  oxyCODONE (OXY IR/ROXICODONE) 5 MG immediate release tablet Take 1 tablet (5 mg total) by mouth every 4 (four) hours as needed for moderate pain. 03/03/17   Gladstone Lighter, MD  pantoprazole (PROTONIX) 40 MG tablet Take 40 mg by mouth 2 (two) times daily.    [provider]  POTASSIUM CHLORIDE CRYS ER PO Take by mouth daily. 1 OTC potassium daily    [provider]  torsemide (DEMADEX) 20 MG tablet Take 2 tablets (40 mg total) by mouth 2 (two) times daily. 09/09/17   Alisa Graff, FNP  vitamin C (ASCORBIC ACID) 500 MG tablet Take 500 mg by mouth daily.    [provider]  warfarin (COUMADIN) 1 MG tablet Take 1 mg daily at 6 PM by mouth.     [provider]  warfarin (COUMADIN) 5 MG tablet Take 5 mg by mouth daily. 04/22/17   [provider]     Allergies Other; Exenatide; and Rosiglitazone  Family History  Problem Relation Age of Onset  . COPD Mother   . Heart disease Mother   . Anemia Mother   . Heart disease Father   . COPD Father   . Anemia Sister   . Diabetes Maternal Grandmother   . Hypertension Paternal Grandfather     Social History Social History   Tobacco Use  . Smoking status: Former Smoker    Packs/day: 3.00    Types: Cigarettes    Last  attempt to quit: 1988    Years since quitting: 31.2  . Smokeless tobacco: Never Used  Substance Use Topics  . Alcohol use: No  . Drug use: No    Review of Systems  Constitutional: No dizziness Eyes: No visual changes.  ENT: No neck pain Cardiovascular: Denies chest pain. Respiratory: Denies shortness of breath. Gastrointestinal: No abdominal pain.  No nausea, no vomiting.   Genitourinary: Reports a history of kidney disease Musculoskeletal: Left knee pain  Skin: Bruising to the left knee Neurological: Negative for headaches    ____________________________________________   PHYSICAL EXAM:  VITAL SIGNS: ED Triage Vitals  Enc Vitals Group     BP 11/14/17 1020 131/90     Pulse --      Resp 11/14/17 1020 19     Temp 11/14/17 1020 97.9 F (36.6 C)     Temp src --      SpO2 11/14/17 1020 98 %     Weight 11/14/17 1027 (!) 138.3 kg (304 lb 12.8 oz)     Height 11/14/17 1027 1.651 m (5\' 5" )     Head Circumference --      Peak Flow --      Pain Score 11/14/17 1027 6     Pain Loc --      Pain Edu? --      Excl. in [place]? --     Constitutional: Alert and oriented. No acute distress.  Eyes: Conjunctivae are normal.  Head: Atraumatic. Nose: No epistaxis or swelling  Neck:  Painless ROM Cardiovascular: Normal rate, regular rhythm. Grossly normal heart sounds.  Good peripheral circulation. Respiratory: Normal respiratory effort.  No retractions. Lungs CTAB. Gastrointestinal: Soft and nontender. No distention.    Musculoskeletal: Left knee: Large hematoma Antero laterally, 2+ distal pulses DP, warm and well perfused, compartments soft, no mottling or discoloration Neurologic:  Normal speech and language. No gross focal neurologic deficits are appreciated.  Skin:  Skin is warm, dry and intact.  Psychiatric: Mood and affect are normal. Speech and behavior are normal.  ____________________________________________   LABS (all labs ordered are listed, but only abnormal results  are displayed)  Labs Reviewed  CBC - Abnormal; Notable for the following components:      Result Value   WBC 15.8 (*)    RDW 18.6 (*)    All other components within normal limits  COMPREHENSIVE METABOLIC PANEL - Abnormal; Notable for the following components:   Potassium 2.6 (*)    Chloride 87 (*)    CO2 36 (*)    Glucose, Bld 197 (*)    BUN 56 (*)    Creatinine, Ser 1.72 (*)    Total Protein 8.3 (*)    GFR calc non Af Amer 31 (*)    GFR calc Af Amer 36 (*)    All other components within normal limits  PROTIME-INR - Abnormal; Notable for the following components:   Prothrombin Time 25.0 (*)    All other components within normal limits  APTT   ____________________________________________  EKG  None ____________________________________________  RADIOLOGY  X-ray shows comminuted fracture of the distal femur ____________________________________________   PROCEDURES  Procedure(s) performed: No  Procedures   Critical Care performed: No ____________________________________________   INITIAL IMPRESSION / ASSESSMENT AND PLAN / ED COURSE  Pertinent labs & imaging results that were available during my care of the patient were reviewed by me and considered in my medical decision making (see chart for details).  Patient presents after fall with significant hematoma to the left knee.  X-ray confirms comminuted fracture of the distal femur.  Discussed with Dr. Marton Redwood of orthopedics, he states because of extension into the joint would recommend transfer.    Discussed with Dr. Marcelino Scot at [facility] who recommends admission to the hospitalist service with consultation to him  [facility] paged, transfer being arranged.   Patient with hyperkalemia, will replete.  Continued pain at 2 PM, IV Dilaudid ordered    ____________________________________________   FINAL  CLINICAL IMPRESSION(S) / ED DIAGNOSES  Final diagnoses:  Closed supracondylar fracture of left femur, initial  encounter [facility])        Note:  This document was prepared using Dragon voice recognition software and may include unintentional dictation errors.    Lavonia Drafts, MD 11/14/17 [phone]

## 2017-11-14 NOTE — ED Notes (Signed)
[facility]  CALLED  FOR  TRANSFER PER  DR KINNER MD 

## 2017-11-14 NOTE — ED Notes (Signed)
EMTALA reviewed. 

## 2017-11-14 NOTE — ED Notes (Signed)
Pt requesting more pain medication at this time.  EDP gave VO for dilaudid 1mg  IV push once at this time.

## 2017-11-15 ENCOUNTER — Ambulatory Visit: Payer: Medicare Other | Admitting: Family

## 2017-11-15 ENCOUNTER — Encounter (HOSPITAL_COMMUNITY): Payer: Self-pay | Admitting: General Practice

## 2017-11-15 ENCOUNTER — Inpatient Hospital Stay (HOSPITAL_COMMUNITY): Payer: Medicare Other

## 2017-11-15 ENCOUNTER — Other Ambulatory Visit: Payer: Self-pay

## 2017-11-15 LAB — URINALYSIS, ROUTINE W REFLEX MICROSCOPIC
BACTERIA UA: NONE SEEN
BILIRUBIN URINE: NEGATIVE
GLUCOSE, UA: 50 mg/dL — AB
Ketones, ur: NEGATIVE mg/dL
NITRITE: NEGATIVE
Protein, ur: NEGATIVE mg/dL
SPECIFIC GRAVITY, URINE: 1.009 (ref 1.005–1.030)
pH: 7 (ref 5.0–8.0)

## 2017-11-15 LAB — CBC
HCT: 35.2 % — ABNORMAL LOW (ref 36.0–46.0)
Hemoglobin: 10.9 g/dL — ABNORMAL LOW (ref 12.0–15.0)
MCH: 26.7 pg (ref 26.0–34.0)
MCHC: 31 g/dL (ref 30.0–36.0)
MCV: 86.3 fL (ref 78.0–100.0)
PLATELETS: 218 10*3/uL (ref 150–400)
RBC: 4.08 MIL/uL (ref 3.87–5.11)
RDW: 17.3 % — AB (ref 11.5–15.5)
WBC: 15.7 10*3/uL — AB (ref 4.0–10.5)

## 2017-11-15 LAB — BASIC METABOLIC PANEL
Anion gap: 15 (ref 5–15)
BUN: 51 mg/dL — AB (ref 6–20)
CO2: 33 mmol/L — ABNORMAL HIGH (ref 22–32)
CREATININE: 1.72 mg/dL — AB (ref 0.44–1.00)
Calcium: 8.6 mg/dL — ABNORMAL LOW (ref 8.9–10.3)
Chloride: 91 mmol/L — ABNORMAL LOW (ref 101–111)
GFR calc Af Amer: 36 mL/min — ABNORMAL LOW (ref >60)
GFR calc non Af Amer: 31 mL/min — ABNORMAL LOW (ref >60)
GLUCOSE: 219 mg/dL — AB (ref 65–99)
Potassium: 3.3 mmol/L — ABNORMAL LOW (ref 3.5–5.1)
SODIUM: 139 mmol/L (ref 135–145)

## 2017-11-15 LAB — PROTIME-INR
INR: 2.13
PROTHROMBIN TIME: 23.7 s — AB (ref 11.4–15.2)

## 2017-11-15 LAB — GLUCOSE, CAPILLARY
GLUCOSE-CAPILLARY: 198 mg/dL — AB (ref 65–99)
GLUCOSE-CAPILLARY: 177 mg/dL — AB (ref 65–99)
Glucose-Capillary: 257 mg/dL — ABNORMAL HIGH (ref 65–99)

## 2017-11-15 LAB — SURGICAL PCR SCREEN
MRSA, PCR: NEGATIVE
Staphylococcus aureus: NEGATIVE

## 2017-11-15 LAB — MAGNESIUM: Magnesium: 2 mg/dL (ref 1.7–2.4)

## 2017-11-15 MED ORDER — DIPHENHYDRAMINE HCL 25 MG PO CAPS
25.0000 mg | ORAL_CAPSULE | Freq: Once | ORAL | Status: AC
  Administered 2017-11-15: 25 mg via ORAL
  Filled 2017-11-15: qty 1

## 2017-11-15 MED ORDER — TORSEMIDE 20 MG PO TABS
20.0000 mg | ORAL_TABLET | Freq: Two times a day (BID) | ORAL | Status: AC
  Administered 2017-11-15 (×2): 20 mg via ORAL
  Filled 2017-11-15 (×2): qty 1

## 2017-11-15 MED ORDER — POTASSIUM CHLORIDE CRYS ER 20 MEQ PO TBCR
40.0000 meq | EXTENDED_RELEASE_TABLET | Freq: Once | ORAL | Status: AC
Start: 2017-11-15 — End: 2017-11-15
  Administered 2017-11-15: 40 meq via ORAL
  Filled 2017-11-15: qty 2

## 2017-11-15 MED ORDER — DEXTROSE 5 % IV SOLN
3.0000 g | INTRAVENOUS | Status: DC
  Filled 2017-11-15: qty 3000

## 2017-11-15 MED ORDER — SODIUM CHLORIDE 0.9 % IV SOLN
Freq: Once | INTRAVENOUS | Status: AC
  Administered 2017-11-15: 21:00:00 via INTRAVENOUS

## 2017-11-15 MED ORDER — POLYETHYLENE GLYCOL 3350 17 G PO PACK
17.0000 g | PACK | Freq: Every day | ORAL | Status: DC
  Administered 2017-11-15 – 2017-11-21 (×5): 17 g via ORAL
  Filled 2017-11-15 (×6): qty 1

## 2017-11-15 MED ORDER — DEXTROSE 5 % IV SOLN: 3.0000 g | INTRAVENOUS | Status: DC

## 2017-11-15 MED ORDER — INSULIN GLARGINE 100 UNIT/ML ~~LOC~~ SOLN
10.0000 [IU] | Freq: Every day | SUBCUTANEOUS | Status: AC
  Administered 2017-11-15: 10 [IU] via SUBCUTANEOUS
  Filled 2017-11-15: qty 0.1

## 2017-11-15 MED ORDER — ACETAMINOPHEN 325 MG PO TABS
650.0000 mg | ORAL_TABLET | Freq: Once | ORAL | Status: AC
  Administered 2017-11-15: 650 mg via ORAL
  Filled 2017-11-15: qty 2

## 2017-11-15 MED ORDER — GABAPENTIN 100 MG PO CAPS
100.0000 mg | ORAL_CAPSULE | Freq: Every day | ORAL | Status: DC
  Administered 2017-11-15 – 2017-11-21 (×6): 100 mg via ORAL
  Filled 2017-11-15 (×7): qty 1

## 2017-11-15 NOTE — Consult Note (Signed)
Reason for Consult:Distal femur fx Referring Physician: [name] is an 62 y.o. female with morbid obesity, PAF on coumadin, DM, CKD, COPD, HTN, dyslipidemia, OSA, GERD, CHF, anxiety, and hypothyroidism. HPI: Andrea Rivers was at home and got her left foot caught in her comforter that was partially on the floor. She tripped and fell onto her left knee. She had immediate pain and could not ambulate afterwards. She was taken to [facility] where x-rays showed a distal femur fx. She was transferred to [facility] for definitive care.  Past Medical History:  Diagnosis Date  . Anemia   . Anxiety   . CHF (congestive heart failure) ([place])   . Chronic kidney disease    INSUFFICIENCY  . COPD (chronic obstructive pulmonary disease) ([place])   . Diabetes mellitus without complication ([place])   . Dyspnea    DOE  . Dysrhythmia    A FIB  . Edema   . GERD (gastroesophageal reflux disease)   . History of kidney stones   . Hypertension   . Hypothyroidism    ABLATION  . Iron deficiency anemia 03/31/2017  . Neuropathy   . Orthopnea   . Sleep apnea    CPAP  . Vertigo   . Wheezing     Past Surgical History:  Procedure Laterality Date  . CATARACT EXTRACTION W/PHACO Left 01/26/2017   Procedure: CATARACT EXTRACTION PHACO AND INTRAOCULAR LENS PLACEMENT (IOC);  Surgeon: Estill Cotta, MD;  Location: [facility] ORS;  Service: Ophthalmology;  Laterality: Left;  [place]   1:02.2 AP     23.7 CDE   28.92 fluid pack lot# [id number] H exp.05/08/2018  . CHOLECYSTECTOMY    . TONSILLECTOMY      Family History  Problem Relation Age of Onset  . COPD Mother   . Heart disease Mother   . Anemia Mother   . Heart disease Father   . COPD Father   . Anemia Sister   . Diabetes Maternal Grandmother   . Hypertension Paternal Grandfather     Social History:  reports that she quit smoking about 31 years ago. Her smoking use included cigarettes. She smoked 3.00 packs per day. She has never used smokeless tobacco. She reports that  she does not drink alcohol or use drugs.  Allergies:  Allergies  Allergen Reactions  . Other Other (See Comments)    ILOSONE- Caused GI distress also  . Exenatide Nausea Only and Other (See Comments)    [place]- Nausea and abdominal pain, also  . Garlic Other (See Comments)    Severe acid reflux  . Latex Hives  . Onion Other (See Comments)    Severe acid reflux  . Rosiglitazone Other (See Comments)    [place]- Affected heart    Medications: I have reviewed the patient's current medications.  Results for orders placed or performed during the hospital encounter of 11/14/17 (from the past 48 hour(s))  Protime-INR     Status: Abnormal   Collection Time: 11/14/17  6:39 PM  Result Value Ref Range   Prothrombin Time 25.0 (H) 11.4 - 15.2 seconds   INR 2.29     Comment: Performed at [facility] [address], [place], [place] [address]  APTT     Status: Abnormal   Collection Time: 11/14/17  6:39 PM  Result Value Ref Range   aPTT 39 (H) 24 - 36 seconds    Comment:        IF BASELINE aPTT IS ELEVATED, SUGGEST PATIENT RISK ASSESSMENT BE USED  TO DETERMINE APPROPRIATE ANTICOAGULANT THERAPY. Performed at [facility], [place] [address] [place], [place] [address]   Hemoglobin A1c     Status: Abnormal   Collection Time: 11/14/17  6:39 PM  Result Value Ref Range   Hgb A1c MFr Bld 7.8 (H) 4.8 - 5.6 %    Comment: (NOTE) Pre diabetes:          5.7%-6.4% Diabetes:              >6.4% Glycemic control for   <7.0% adults with diabetes    Mean Plasma Glucose 177.16 mg/dL    Comment: Performed at [place] [facility], [place] [address] [place], [place] [address]  Basic metabolic panel     Status: Abnormal   Collection Time: 11/14/17  6:39 PM  Result Value Ref Range   Sodium 139 135 - 145 mmol/L   Potassium 2.7 (LL) 3.5 - 5.1 mmol/L    Comment: CRITICAL RESULT CALLED TO, READ BACK BY AND VERIFIED WITH: Levora Angel 2023 11/14/2017 WBOND    Chloride 87 (L) 101 - 111 mmol/L   CO2  31 22 - 32 mmol/L   Glucose, Bld 265 (H) 65 - 99 mg/dL   BUN 51 (H) 6 - 20 mg/dL   Creatinine, Ser 1.81 (H) 0.44 - 1.00 mg/dL   Calcium 9.2 8.9 - 10.3 mg/dL   GFR calc non Af Amer 29 (L) >60 mL/min   GFR calc Af Amer 33 (L) >60 mL/min    Comment: (NOTE) The eGFR has been calculated using the CKD EPI equation. This calculation has not been validated in all clinical situations. eGFR's persistently <60 mL/min signify possible Chronic Kidney Disease.    Anion gap 21 (H) 5 - 15    Comment: Performed at [facility], [place] [address], [place], [place] [address]  CBC     Status: Abnormal   Collection Time: 11/14/17  6:39 PM  Result Value Ref Range   WBC 17.8 (H) 4.0 - 10.5 K/uL   RBC 4.42 3.87 - 5.11 MIL/uL   Hemoglobin 11.8 (L) 12.0 - 15.0 g/dL   HCT 38.1 36.0 - 46.0 %   MCV 86.2 78.0 - 100.0 fL   MCH 26.7 26.0 - 34.0 pg   MCHC 31.0 30.0 - 36.0 g/dL   RDW 17.2 (H) 11.5 - 15.5 %   Platelets 229 150 - 400 K/uL    Comment: Performed at [facility] [address], [place], [place] [address]  Type and screen [place]     Status: None   Collection Time: 11/14/17  6:40 PM  Result Value Ref Range   ABO/RH(D) O POS    Antibody Screen NEG    Sample Expiration      11/17/2017 Performed at [facility], [place] [address], [place], [place] [address]   ABO/Rh     Status: None   Collection Time: 11/14/17  6:40 PM  Result Value Ref Range   ABO/RH(D)      O POS Performed at [facility] [address] [place], [place] [address]   Basic metabolic panel     Status: Abnormal   Collection Time: 11/15/17  4:02 AM  Result Value Ref Range   Sodium 139 135 - 145 mmol/L   Potassium 3.3 (L) 3.5 - 5.1 mmol/L    Comment: NO VISIBLE HEMOLYSIS   Chloride 91 (L) 101 - 111 mmol/L   CO2 33 (H) 22 -  32 mmol/L   Glucose, Bld 219 (H) 65 - 99 mg/dL   BUN 51 (H) 6 - 20 mg/dL   Creatinine, Ser 1.72 (H) 0.44 - 1.00 mg/dL   Calcium 8.6 (L) 8.9 - 10.3 mg/dL   GFR  calc non Af Amer 31 (L) >60 mL/min   GFR calc Af Amer 36 (L) >60 mL/min    Comment: (NOTE) The eGFR has been calculated using the CKD EPI equation. This calculation has not been validated in all clinical situations. eGFR's persistently <60 mL/min signify possible Chronic Kidney Disease.    Anion gap 15 5 - 15    Comment: Performed at [facility] [address], [place], [place] [address]  CBC     Status: Abnormal   Collection Time: 11/15/17  4:02 AM  Result Value Ref Range   WBC 15.7 (H) 4.0 - 10.5 K/uL   RBC 4.08 3.87 - 5.11 MIL/uL   Hemoglobin 10.9 (L) 12.0 - 15.0 g/dL   HCT 35.2 (L) 36.0 - 46.0 %   MCV 86.3 78.0 - 100.0 fL   MCH 26.7 26.0 - 34.0 pg   MCHC 31.0 30.0 - 36.0 g/dL   RDW 17.3 (H) 11.5 - 15.5 %   Platelets 218 150 - 400 K/uL    Comment: Performed at [facility], [place] [address] [place], [place] [address]  Protime-INR     Status: Abnormal   Collection Time: 11/15/17  4:02 AM  Result Value Ref Range   Prothrombin Time 23.7 (H) 11.4 - 15.2 seconds   INR 2.13     Comment: Performed at [facility] [address] [place], [place] [address]  Surgical pcr screen     Status: None   Collection Time: 11/15/17  5:47 AM  Result Value Ref Range   MRSA, PCR NEGATIVE NEGATIVE   Staphylococcus aureus NEGATIVE NEGATIVE    Comment: (NOTE) The Xpert SA Assay (FDA approved for NASAL specimens in patients 67 years of age and older), is one component of a comprehensive surveillance program. It is not intended to diagnose infection nor to guide or monitor treatment. Performed at [facility], [place] [address] [place],  [address]   Glucose, capillary     Status: Abnormal   Collection Time: 11/15/17  7:45 AM  Result Value Ref Range   Glucose-Capillary 257 (H) 65 - 99 mg/dL    Chest Portable 1 View  Result Date: 11/14/2017 CLINICAL DATA:  Preop radiograph.  Left femur fracture. EXAM: PORTABLE CHEST 1 VIEW COMPARISON:  04/14/2017. FINDINGS: The  patient appears rotated. Mild cardiac enlargement. No pleural effusion or edema identified. No airspace opacities. Visualized skeletal structures are unremarkable. IMPRESSION: No active disease. Electronically Signed   By: Kerby Moors M.D.   On: 11/14/2017 19:38   Dg Knee Complete 4 Views Left  Result Date: 11/14/2017 CLINICAL DATA:  Post fall.  Fell directly on LEFT knee.  Pain. EXAM: LEFT KNEE - COMPLETE 4+ VIEW COMPARISON:  None. FINDINGS: There is a comminuted fracture of the distal femur, supracondylar, with displacement. Soft tissue swelling. Joint effusion. Anterior displacement of the femoral shaft on the distal femur/condyles. No knee dislocation. Soft tissue calcification is noted above and below the joint, medially. IMPRESSION: Comminuted fracture of the distal femur, supracondylar, with anterior displacement. Soft tissue swelling with joint effusion. Electronically Signed   By: Staci Righter M.D.   On: 11/14/2017 11:07    Review of Systems  Constitutional: Negative for weight  loss.  HENT: Negative for ear discharge, ear pain, hearing loss and tinnitus.   Eyes: Negative for blurred vision, double vision, photophobia and pain.  Respiratory: Negative for cough, sputum production and shortness of breath.   Cardiovascular: Negative for chest pain.  Gastrointestinal: Negative for abdominal pain, nausea and vomiting.  Genitourinary: Negative for dysuria, flank pain, frequency and urgency.  Musculoskeletal: Positive for joint pain (left knee). Negative for back pain, falls, myalgias and neck pain.  Neurological: Negative for dizziness, tingling, sensory change, focal weakness, loss of consciousness and headaches.  Endo/Heme/Allergies: Does not bruise/bleed easily.  Psychiatric/Behavioral: Negative for depression, memory loss and substance abuse. The patient is not nervous/anxious.    Blood pressure (!) 142/58, pulse 64, temperature 99 F (37.2 C), temperature source Oral, resp. rate 18,  SpO2 100 %. Physical Exam  Constitutional: She appears well-developed and well-nourished. No distress.  HENT:  Head: Normocephalic and atraumatic.  Eyes: Conjunctivae are normal. Right eye exhibits no discharge. Left eye exhibits no discharge. No scleral icterus.  Neck: Normal range of motion.  Cardiovascular: Normal rate and regular rhythm.  Respiratory: Effort normal. No respiratory distress.  Musculoskeletal:  LLE No traumatic wounds, ecchymosis, or rash  Severe knee pain with manipulation, in KI  No ankle effusion  Sens DPN, SPN, TN paresthetic (chronic)  Motor EHL, ext, flex, evers 5/5  DP 2+, PT 0, No significant edema  Neurological: She is alert.  Skin: Skin is warm and dry. She is not diaphoretic.  Psychiatric: She has a normal mood and affect. Her behavior is normal.    Assessment/Plan: Fall Left distal femur fx -- Will plan for ORIF tomorrow by Dr. Doreatha Martin. NPO after MN. Anticoagulated on coumadin -- Recheck INR in am, may need vitamin K/FFP if over 1.8 Multiple medical problems -- per primary service    Lisette Abu, PA-C Orthopedic Surgery [phone] 11/15/2017, 9:09 AM

## 2017-11-15 NOTE — Progress Notes (Signed)
Orthopaedic Trauma Service  Reviewing record in preparation for surgery tomorrow  INR still elevated at 2.13  Do not think she will correct enough overnight without any intervention   Do think it is in the pts best interest to be fixed asap to enable her to mobilize given all her chronic medical conditions which will likely become acutely exacerbated by prolonged bedrest and waiting for INR to autocorrect   Discussed with medical service as well  Will give 1 unit of FFP tonight Follow up on INR in am, may give additional unit in am if INR still >1.8   Jari Pigg, PA-C [facility] [phone] (C) [phone] (O) 11/15/2017 7:06 PM

## 2017-11-15 NOTE — Progress Notes (Signed)
PROGRESS NOTE  Andrea Rivers FWY:[id number] DOB: 06/17/1956 DOA: 11/14/2017 PCP: Andrea Hartigan, MD  HPI/Interval History  Andrea Rivers is a 62 y.o. year old female with medical history significant for PAF on coumadin, diastolic CHF, HTN, CKD, stage III (baseline creatinine: 1.6-1.8), T2DM , OSA not on CPAP who presented to [facility] on 4/8 as a transfer from [facility] ED for a  left distal femur comminuted fracture after mechanical fall.  Per patient she tripped on bedside comforter and landed on her left knee. Prior to this she was completely independent with her ADLs and iADLs.   She is adherent with all her chronic medications; the only change she made was an extra dose of metolazone prior to admission for extra fluid build up she noticed.    Subjective Knee pain is controlled with current pain regimen. No chest pain, cough, SOB, abdominal pain.   Assessment/Plan: Principal Problem:   Fracture Active Problems:   Chronic diastolic heart failure (HCC)   HTN (hypertension)   Diabetes (HCC)   Obstructive sleep apnea   Iron deficiency anemia   CKD (chronic kidney disease)   Hypothyroidism   Atrial fibrillation (HCC)   Anxiety state   Peripheral neuropathy  Left comminuted distal fracture, after mechanical fall -Patient tripped after foot got tangled in comforter on bed -X-ray (OSH) Right distal comminuted fracture with anterior displacement, joint effusion -EDP spoke with Dr. Altamese Overlea (orthopedic) -Ortho for possible ORIF tomorrow pending INR, NPO at midnight - PT eval after surgery -Pain control:  IV morphine every 4 hours as needed severe pain, oxycodone 5-10 mg every 4 hours as needed moderate breakthrough pain - Bowel regimen: miralax scheduled, PRN senna  Hypokalemia Likely related to diuretics ( metolazone and torsemide) Mg wnl Supplement as needed, daily BMP  Leukocytosis, improving Most likely Stress related to acute fracture Without infectious  signs or symptoms and afebrile s/p timed IVF and daily CBC, UA & CXR neg, blood cx NGTD  Atrial fibrillation, rate controlled -INR trend: 2.29--2.2--2.13 -Holding Coumadin therapy in setting of upcoming surgery -Continue home amiodarone and metoprolol -Continue daily INR expect goal of 1.8, may need Vitamin K   Diastolic CHF, euvolemic on exam - minimal edema of right ankle on exam - resume half home torsemide 20 mg BID for today. Hold tomorrow in light of possible surgery - daily weights, I/Os, closely monitor volume status  CKD, stage 3, stable - creatinine stable at baseline  Chronic IDA, stable hgb 10.9, will obtain type and screen No longer on oral iron Gets infusion therapy as outpatient Monitor daily on cbc  T2DM, A1c 7.8%(11/2017) Fairly large doses of humalog at home Add lantus 10 U to am today only, hold long acting tomorrow for possible surgery Sliding scale Novolog, monitor CBG  Hypothyroidism,stable TSH 2.7(09/2017) Home Synthroid  Vertigo, chronic, stable PRN home antivert  GERD, chronic, stable protonix  Hyperlipidemia Home pravastatin  Code Status: Full code   Family Communication: no family at bedside   Disposition Plan: possible ORIF tomorrow, NPO at MN, hopeful INR is better, may need vitamin K to get to goal < 1.8   Consultants:  Orthopedic  Procedures:  None  Antimicrobials:  None   Cultures:  Blood cultures x 2 ( 4/8)  Telemetry:  DVT prophylaxis: INR still therapeutic, then SCDs   Objective: Vitals:   11/14/17 2131 11/15/17 0551 11/15/17 0700 11/15/17 1425  BP: (!) 135/58 (!) 144/132 (!) 142/58 130/60  Pulse: 73 64  (!) 57  Resp: 17 18  18   Temp: 98.4 F (36.9 C) 99 F (37.2 C)  98 F (36.7 C)  TempSrc: Oral Oral  Oral  SpO2: 100% 100%  100%    Intake/Output Summary (Last 24 hours) at 11/15/2017 1444 Last data filed at 11/15/2017 7124 Gross per 24 hour  Intake -  Output 825 ml  Net -825 ml   There were no  vitals filed for this visit.  Exam:  Constitutional:morbidly obese, normal appearing female Eyes: EOMI, anicteric, normal conjunctivae ENMT: Oropharynx with moist mucous membranes, normal dentition Neck: FROM,  Cardiovascular: RRR no MRGs, with no peripheral edema Respiratory: Normal respiratory effort on room air, clear breath sounds  Abdomen: Soft,non-tender, with no appreciable HSM Skin: No rash ulcers, or lesions. Without skin tenting  MSK: Right knee bandaged and immobilized, right ankle warm, distal pulses appreciated Neuro: Moving all extremities (except right leg), no appreciable focal deficits Psych: Alert, oriented to person, place, time, context  Data Reviewed: CBC: Recent Labs  Lab 11/14/17 1158 11/14/17 1839 11/15/17 0402  WBC 15.8* 17.8* 15.7*  HGB 13.0 11.8* 10.9*  HCT 40.3 38.1 35.2*  MCV 83.0 86.2 86.3  PLT 214 229 580   Basic Metabolic Panel: Recent Labs  Lab 11/14/17 1030 11/14/17 1158 11/14/17 1839 11/15/17 0402  NA  --  138 139 139  K  --  2.6* 2.7* 3.3*  CL  --  87* 87* 91*  CO2  --  36* 31 33*  GLUCOSE  --  197* 265* 219*  BUN  --  56* 51* 51*  CREATININE  --  1.72* 1.81* 1.72*  CALCIUM  --  9.2 9.2 8.6*  MG 2.0  --   --   --    GFR: Estimated Creatinine Clearance: 47.9 mL/min (A) (by C-G formula based on SCr of 1.72 mg/dL (H)). Liver Function Tests: Recent Labs  Lab 11/14/17 1158  AST 30  ALT 20  ALKPHOS 77  BILITOT 0.7  PROT 8.3*  ALBUMIN 3.8   No results for input(s): LIPASE, AMYLASE in the last 168 hours. No results for input(s): AMMONIA in the last 168 hours. Coagulation Profile: Recent Labs  Lab 11/14/17 1158 11/14/17 1839 11/15/17 0402  INR 2.29 2.29 2.13   Cardiac Enzymes: No results for input(s): CKTOTAL, CKMB, CKMBINDEX, TROPONINI in the last 168 hours. BNP (last 3 results) No results for input(s): PROBNP in the last 8760 hours. HbA1C: Recent Labs    11/14/17 1839  HGBA1C 7.8*   CBG: Recent Labs  Lab  11/15/17 0745 11/15/17 1202  GLUCAP 257* 198*   Lipid Profile: No results for input(s): CHOL, HDL, LDLCALC, TRIG, CHOLHDL, LDLDIRECT in the last 72 hours. Thyroid Function Tests: No results for input(s): TSH, T4TOTAL, FREET4, T3FREE, THYROIDAB in the last 72 hours. Anemia Panel: No results for input(s): VITAMINB12, FOLATE, FERRITIN, TIBC, IRON, RETICCTPCT in the last 72 hours. Urine analysis:    Component Value Date/Time   COLORURINE STRAW (A) 05/19/2017 1005   APPEARANCEUR CLEAR 05/19/2017 1005   LABSPEC 1.015 05/19/2017 1005   PHURINE 7.0 05/19/2017 1005   GLUCOSEU NEGATIVE 05/19/2017 1005   HGBUR NEGATIVE 05/19/2017 1005   BILIRUBINUR NEGATIVE 05/19/2017 1005   KETONESUR NEGATIVE 05/19/2017 1005   PROTEINUR NEGATIVE 05/19/2017 1005   NITRITE NEGATIVE 05/19/2017 1005   LEUKOCYTESUR TRACE (A) 05/19/2017 1005   Sepsis Labs: @LABRCNTIP (procalcitonin:4,lacticidven:4)  ) Recent Results (from the past 240 hour(s))  Culture, blood (Routine X 2) w Reflex to ID Panel     Status:  None (Preliminary result)   Collection Time: 11/14/17  6:53 PM  Result Value Ref Range Status   Specimen Description BLOOD LEFT ANTECUBITAL  Final   Special Requests   Final    BOTTLES DRAWN AEROBIC AND ANAEROBIC Blood Culture adequate volume   Culture   Final    NO GROWTH < 24 HOURS Performed at [facility], [address] [place], [place] [address]    Report Status PENDING  Incomplete  Culture, blood (Routine X 2) w Reflex to ID Panel     Status: None (Preliminary result)   Collection Time: 11/14/17  6:57 PM  Result Value Ref Range Status   Specimen Description BLOOD RIGHT WRIST  Final   Special Requests   Final    BOTTLES DRAWN AEROBIC AND ANAEROBIC Blood Culture adequate volume   Culture   Final    NO GROWTH < 24 HOURS Performed at [facility], [place] [address], [place], [place] [address]    Report Status PENDING  Incomplete  Surgical pcr screen     Status: None   Collection  Time: 11/15/17  5:47 AM  Result Value Ref Range Status   MRSA, PCR NEGATIVE NEGATIVE Final   Staphylococcus aureus NEGATIVE NEGATIVE Final    Comment: (NOTE) The Xpert SA Assay (FDA approved for NASAL specimens in patients 31 years of age and older), is one component of a comprehensive surveillance program. It is not intended to diagnose infection nor to guide or monitor treatment. Performed at [facility], [place] [address], [place], [place] [address]       Studies: Chest Portable 1 View  Result Date: 11/14/2017 CLINICAL DATA:  Preop radiograph.  Left femur fracture. EXAM: PORTABLE CHEST 1 VIEW COMPARISON:  04/14/2017. FINDINGS: The patient appears rotated. Mild cardiac enlargement. No pleural effusion or edema identified. No airspace opacities. Visualized skeletal structures are unremarkable. IMPRESSION: No active disease. Electronically Signed   By: Kerby Moors M.D.   On: 11/14/2017 19:38    Scheduled Meds: . amiodarone  200 mg Oral Daily  . citalopram  20 mg Oral Daily  . cloNIDine  0.1 mg Oral BID  . gabapentin  100 mg Oral Daily  . insulin aspart  0-15 Units Subcutaneous TID WC  . levothyroxine  175 mcg Oral QAC breakfast  . pantoprazole  40 mg Oral BID  . pravastatin  20 mg Oral q1800  . torsemide  20 mg Oral BID    Continuous Infusions: . sodium chloride 75 mL/hr at 11/15/17 1201  . [START ON 11/16/2017]  ceFAZolin (ANCEF) IV       LOS: 1 day     Desiree Hane, MD [facility] Pager [phone]  If 7PM-7AM, please contact night-coverage [email or web address] Password [facility] 11/15/2017, 2:44 PM

## 2017-11-15 NOTE — Care Management Note (Signed)
Case Management Note  Patient Details  Name: Andrea Rivers MRN: [id number] Date of Birth: [birth year]  Subjective/Objective:                    Action/Plan: For ORIF of femur tomorrow . Await post op Pt eval for recommendations   Expected Discharge Date:                  Expected Discharge Plan:  [place]  In-House Referral:     Discharge planning Services  CM Consult  Post Acute Care Choice:    Choice offered to:     DME Arranged:    DME Agency:     HH Arranged:    HH Agency:     Status of Service:  In process, will continue to follow  If discussed at Long Length of Stay Meetings, dates discussed:    Additional Comments:  Marilu Favre, RN 11/15/2017, 12:23 PM

## 2017-11-15 NOTE — H&P (View-Only) (Signed)
Reason for Consult:Distal femur fx Referring Physician: Crystalle Rivers is an 62 y.o. female with morbid obesity, PAF on coumadin, DM, CKD, COPD, HTN, dyslipidemia, OSA, GERD, CHF, anxiety, and hypothyroidism. HPI: Andrea Rivers was at home and got her left foot caught in her comforter that was partially on the floor. She tripped and fell onto her left knee. She had immediate pain and could not ambulate afterwards. She was taken to [facility] where x-rays showed a distal femur fx. She was transferred to [facility] for definitive care.  Past Medical History:  Diagnosis Date  . Anemia   . Anxiety   . CHF (congestive heart failure) ([place])   . Chronic kidney disease    INSUFFICIENCY  . COPD (chronic obstructive pulmonary disease) ([place])   . Diabetes mellitus without complication ([place])   . Dyspnea    DOE  . Dysrhythmia    A FIB  . Edema   . GERD (gastroesophageal reflux disease)   . History of kidney stones   . Hypertension   . Hypothyroidism    ABLATION  . Iron deficiency anemia 03/31/2017  . Neuropathy   . Orthopnea   . Sleep apnea    CPAP  . Vertigo   . Wheezing     Past Surgical History:  Procedure Laterality Date  . CATARACT EXTRACTION W/PHACO Left 01/26/2017   Procedure: CATARACT EXTRACTION PHACO AND INTRAOCULAR LENS PLACEMENT (IOC);  Surgeon: Estill Cotta, MD;  Location: [facility] ORS;  Service: Ophthalmology;  Laterality: Left;  [place]   1:02.2 AP     23.7 CDE   28.92 fluid pack lot# [id number] H exp.05/08/2018  . CHOLECYSTECTOMY    . TONSILLECTOMY      Family History  Problem Relation Age of Onset  . COPD Mother   . Heart disease Mother   . Anemia Mother   . Heart disease Father   . COPD Father   . Anemia Sister   . Diabetes Maternal Grandmother   . Hypertension Paternal Grandfather     Social History:  reports that she quit smoking about 31 years ago. Her smoking use included cigarettes. She smoked 3.00 packs per day. She has never used smokeless tobacco. She reports that  she does not drink alcohol or use drugs.  Allergies:  Allergies  Allergen Reactions  . Other Other (See Comments)    ILOSONE- Caused GI distress also  . Exenatide Nausea Only and Other (See Comments)    Byetta- Nausea and abdominal pain, also  . Garlic Other (See Comments)    Severe acid reflux  . Latex Hives  . Onion Other (See Comments)    Severe acid reflux  . Rosiglitazone Other (See Comments)    Avandia- Affected heart    Medications: I have reviewed the patient's current medications.  Results for orders placed or performed during the hospital encounter of 11/14/17 (from the past 48 hour(s))  Protime-INR     Status: Abnormal   Collection Time: 11/14/17  6:39 PM  Result Value Ref Range   Prothrombin Time 25.0 (H) 11.4 - 15.2 seconds   INR 2.29     Comment: Performed at [facility] [address], [place], [place] [address]  APTT     Status: Abnormal   Collection Time: 11/14/17  6:39 PM  Result Value Ref Range   aPTT 39 (H) 24 - 36 seconds    Comment:        IF BASELINE aPTT IS ELEVATED, SUGGEST PATIENT RISK ASSESSMENT BE USED  TO DETERMINE APPROPRIATE ANTICOAGULANT THERAPY. Performed at [facility], [place] [address] [place], [place] [address]   Hemoglobin A1c     Status: Abnormal   Collection Time: 11/14/17  6:39 PM  Result Value Ref Range   Hgb A1c MFr Bld 7.8 (H) 4.8 - 5.6 %    Comment: (NOTE) Pre diabetes:          5.7%-6.4% Diabetes:              >6.4% Glycemic control for   <7.0% adults with diabetes    Mean Plasma Glucose 177.16 mg/dL    Comment: Performed at [facility], [place] [address], [place], [place] [address]  Basic metabolic panel     Status: Abnormal   Collection Time: 11/14/17  6:39 PM  Result Value Ref Range   Sodium 139 135 - 145 mmol/L   Potassium 2.7 (LL) 3.5 - 5.1 mmol/L    Comment: CRITICAL RESULT CALLED TO, READ BACK BY AND VERIFIED WITH: Levora Angel 2023 11/14/2017 WBOND    Chloride 87 (L) 101 - 111 mmol/L   CO2  31 22 - 32 mmol/L   Glucose, Bld 265 (H) 65 - 99 mg/dL   BUN 51 (H) 6 - 20 mg/dL   Creatinine, Ser 1.81 (H) 0.44 - 1.00 mg/dL   Calcium 9.2 8.9 - 10.3 mg/dL   GFR calc non Af Amer 29 (L) >60 mL/min   GFR calc Af Amer 33 (L) >60 mL/min    Comment: (NOTE) The eGFR has been calculated using the CKD EPI equation. This calculation has not been validated in all clinical situations. eGFR's persistently <60 mL/min signify possible Chronic Kidney Disease.    Anion gap 21 (H) 5 - 15    Comment: Performed at [facility], [place] [address] [place], [place] [address]  CBC     Status: Abnormal   Collection Time: 11/14/17  6:39 PM  Result Value Ref Range   WBC 17.8 (H) 4.0 - 10.5 K/uL   RBC 4.42 3.87 - 5.11 MIL/uL   Hemoglobin 11.8 (L) 12.0 - 15.0 g/dL   HCT 38.1 36.0 - 46.0 %   MCV 86.2 78.0 - 100.0 fL   MCH 26.7 26.0 - 34.0 pg   MCHC 31.0 30.0 - 36.0 g/dL   RDW 17.2 (H) 11.5 - 15.5 %   Platelets 229 150 - 400 K/uL    Comment: Performed at [facility] [address], [place], [place] [address]  Type and screen [place]     Status: None   Collection Time: 11/14/17  6:40 PM  Result Value Ref Range   ABO/RH(D) O POS    Antibody Screen NEG    Sample Expiration      11/17/2017 Performed at [facility], [place] [address], [place], [place] [address]   ABO/Rh     Status: None   Collection Time: 11/14/17  6:40 PM  Result Value Ref Range   ABO/RH(D)      O POS Performed at [facility] [address], [place], [place] [address]   Basic metabolic panel     Status: Abnormal   Collection Time: 11/15/17  4:02 AM  Result Value Ref Range   Sodium 139 135 - 145 mmol/L   Potassium 3.3 (L) 3.5 - 5.1 mmol/L    Comment: NO VISIBLE HEMOLYSIS   Chloride 91 (L) 101 - 111 mmol/L   CO2 33 (H) 22 -  32 mmol/L   Glucose, Bld 219 (H) 65 - 99 mg/dL   BUN 51 (H) 6 - 20 mg/dL   Creatinine, Ser 1.72 (H) 0.44 - 1.00 mg/dL   Calcium 8.6 (L) 8.9 - 10.3 mg/dL   GFR  calc non Af Amer 31 (L) >60 mL/min   GFR calc Af Amer 36 (L) >60 mL/min    Comment: (NOTE) The eGFR has been calculated using the CKD EPI equation. This calculation has not been validated in all clinical situations. eGFR's persistently <60 mL/min signify possible Chronic Kidney Disease.    Anion gap 15 5 - 15    Comment: Performed at [facility] [address] [place], [place] [address]  CBC     Status: Abnormal   Collection Time: 11/15/17  4:02 AM  Result Value Ref Range   WBC 15.7 (H) 4.0 - 10.5 K/uL   RBC 4.08 3.87 - 5.11 MIL/uL   Hemoglobin 10.9 (L) 12.0 - 15.0 g/dL   HCT 35.2 (L) 36.0 - 46.0 %   MCV 86.3 78.0 - 100.0 fL   MCH 26.7 26.0 - 34.0 pg   MCHC 31.0 30.0 - 36.0 g/dL   RDW 17.3 (H) 11.5 - 15.5 %   Platelets 218 150 - 400 K/uL    Comment: Performed at [facility], [place] [address], [place], [place] [address]  Protime-INR     Status: Abnormal   Collection Time: 11/15/17  4:02 AM  Result Value Ref Range   Prothrombin Time 23.7 (H) 11.4 - 15.2 seconds   INR 2.13     Comment: Performed at [facility] [address], [place], [place] [address]  Surgical pcr screen     Status: None   Collection Time: 11/15/17  5:47 AM  Result Value Ref Range   MRSA, PCR NEGATIVE NEGATIVE   Staphylococcus aureus NEGATIVE NEGATIVE    Comment: (NOTE) The Xpert SA Assay (FDA approved for NASAL specimens in patients 62 years of age and older), is one component of a comprehensive surveillance program. It is not intended to diagnose infection nor to guide or monitor treatment. Performed at [facility], [place] [address], [place], [place] [address]   Glucose, capillary     Status: Abnormal   Collection Time: 11/15/17  7:45 AM  Result Value Ref Range   Glucose-Capillary 257 (H) 65 - 99 mg/dL    Chest Portable 1 View  Result Date: 11/14/2017 CLINICAL DATA:  Preop radiograph.  Left femur fracture. EXAM: PORTABLE CHEST 1 VIEW COMPARISON:  04/14/2017. FINDINGS: The  patient appears rotated. Mild cardiac enlargement. No pleural effusion or edema identified. No airspace opacities. Visualized skeletal structures are unremarkable. IMPRESSION: No active disease. Electronically Signed   By: Kerby Moors M.D.   On: 11/14/2017 19:38   Dg Knee Complete 4 Views Left  Result Date: 11/14/2017 CLINICAL DATA:  Post fall.  Fell directly on LEFT knee.  Pain. EXAM: LEFT KNEE - COMPLETE 4+ VIEW COMPARISON:  None. FINDINGS: There is a comminuted fracture of the distal femur, supracondylar, with displacement. Soft tissue swelling. Joint effusion. Anterior displacement of the femoral shaft on the distal femur/condyles. No knee dislocation. Soft tissue calcification is noted above and below the joint, medially. IMPRESSION: Comminuted fracture of the distal femur, supracondylar, with anterior displacement. Soft tissue swelling with joint effusion. Electronically Signed   By: Staci Righter M.D.   On: 11/14/2017 11:07    Review of Systems  Constitutional: Negative for weight  loss.  HENT: Negative for ear discharge, ear pain, hearing loss and tinnitus.   Eyes: Negative for blurred vision, double vision, photophobia and pain.  Respiratory: Negative for cough, sputum production and shortness of breath.   Cardiovascular: Negative for chest pain.  Gastrointestinal: Negative for abdominal pain, nausea and vomiting.  Genitourinary: Negative for dysuria, flank pain, frequency and urgency.  Musculoskeletal: Positive for joint pain (left knee). Negative for back pain, falls, myalgias and neck pain.  Neurological: Negative for dizziness, tingling, sensory change, focal weakness, loss of consciousness and headaches.  Endo/Heme/Allergies: Does not bruise/bleed easily.  Psychiatric/Behavioral: Negative for depression, memory loss and substance abuse. The patient is not nervous/anxious.    Blood pressure (!) 142/58, pulse 64, temperature 99 F (37.2 C), temperature source Oral, resp. rate 18,  SpO2 100 %. Physical Exam  Constitutional: She appears well-developed and well-nourished. No distress.  HENT:  Head: Normocephalic and atraumatic.  Eyes: Conjunctivae are normal. Right eye exhibits no discharge. Left eye exhibits no discharge. No scleral icterus.  Neck: Normal range of motion.  Cardiovascular: Normal rate and regular rhythm.  Respiratory: Effort normal. No respiratory distress.  Musculoskeletal:  LLE No traumatic wounds, ecchymosis, or rash  Severe knee pain with manipulation, in KI  No ankle effusion  Sens DPN, SPN, TN paresthetic (chronic)  Motor EHL, ext, flex, evers 5/5  DP 2+, PT 0, No significant edema  Neurological: She is alert.  Skin: Skin is warm and dry. She is not diaphoretic.  Psychiatric: She has a normal mood and affect. Her behavior is normal.    Assessment/Plan: Fall Left distal femur fx -- Will plan for ORIF tomorrow by Dr. Doreatha Martin. NPO after MN. Anticoagulated on coumadin -- Recheck INR in am, may need vitamin K/FFP if over 1.8 Multiple medical problems -- per primary service    Lisette Abu, PA-C Orthopedic Surgery [phone] 11/15/2017, 9:09 AM

## 2017-11-15 NOTE — Progress Notes (Signed)
Inpatient Diabetes Program Recommendations  AACE/ADA: New Consensus Statement on Inpatient Glycemic Control (2015)  Target Ranges:  Prepandial:   less than 140 mg/dL      [place] postprandial:   less than 180 mg/dL (1-2 hours)      Critically ill patients:  140 - 180 mg/dL   Lab Results  Component Value Date   GLUCAP 198 (H) 11/15/2017   HGBA1C 7.8 (H) 11/14/2017    Review of Glycemic ControlResults for ALIA, PARSLEY (MRN [id number]) as of 11/15/2017 16:09  Ref. Range 11/15/2017 07:45 11/15/2017 12:02  Glucose-Capillary Latest Ref Range: 65 - 99 mg/dL 257 (H) 198 (H)   Diabetes history: Type 2 DM Outpatient Diabetes medications: Amaryl 2 mg with breakfast, Humalog 30-40 units tid, Tradjenta 5 mg daily Current orders for Inpatient glycemic control:  Novolog moderate tid with meals Inpatient Diabetes Program Recommendations:   Please consider changing Novolog to q 4 hours coverage since patient NPO after midnight.  May also consider adding Lantus 15 units daily.    Thanks, Adah Perl, RN, BC-ADM Inpatient Diabetes Coordinator Pager [phone] (8a-5p)

## 2017-11-15 NOTE — Progress Notes (Signed)
PT Cancellation Note  Patient Details Name: Andrea Rivers MRN: [id number] DOB: [birth year]   Cancelled Treatment:    Reason Eval/Treat Not Completed: Patient not medically ready Received PT order and noted to start after surgery. Per notes, likely to OR tomorrow for ORIF of femur fracture. Will begin PT after surgery and after new order placed.   Leighton Ruff, PT, DPT  Acute Rehabilitation Services  Pager: [phone]    Rudean Hitt 11/15/2017, 11:46 AM

## 2017-11-16 ENCOUNTER — Encounter (HOSPITAL_COMMUNITY): Payer: Self-pay | Admitting: Certified Registered Nurse Anesthetist

## 2017-11-16 ENCOUNTER — Encounter (HOSPITAL_COMMUNITY)
Admission: AD | Disposition: A | Discharge status: Skilled Nursing Facility | Payer: Self-pay | Source: Other Acute Inpatient Hospital | Attending: Internal Medicine

## 2017-11-16 LAB — PROTIME-INR
INR: 2.2
INR: 2.08
INR: 1.78
PROTHROMBIN TIME: 23.2 s — AB (ref 11.4–15.2)
PROTHROMBIN TIME: 20.5 s — AB (ref 11.4–15.2)
Prothrombin Time: 24.3 seconds — ABNORMAL HIGH (ref 11.4–15.2)

## 2017-11-16 LAB — BASIC METABOLIC PANEL
ANION GAP: 13 (ref 5–15)
BUN: 43 mg/dL — ABNORMAL HIGH (ref 6–20)
CHLORIDE: 92 mmol/L — AB (ref 101–111)
CO2: 35 mmol/L — ABNORMAL HIGH (ref 22–32)
Calcium: 8.3 mg/dL — ABNORMAL LOW (ref 8.9–10.3)
Creatinine, Ser: 1.59 mg/dL — ABNORMAL HIGH (ref 0.44–1.00)
GFR calc non Af Amer: 34 mL/min — ABNORMAL LOW (ref >60)
GFR, EST AFRICAN AMERICAN: 39 mL/min — AB (ref >60)
Glucose, Bld: 194 mg/dL — ABNORMAL HIGH (ref 65–99)
POTASSIUM: 3.9 mmol/L (ref 3.5–5.1)
SODIUM: 140 mmol/L (ref 135–145)

## 2017-11-16 LAB — PREPARE FRESH FROZEN PLASMA

## 2017-11-16 LAB — BPAM FFP
BLOOD PRODUCT EXPIRATION DATE: 201904122359
ISSUE DATE / TIME: 201904092032
UNIT TYPE AND RH: 6200

## 2017-11-16 LAB — CBC
HEMATOCRIT: 32.9 % — AB (ref 36.0–46.0)
Hemoglobin: 9.7 g/dL — ABNORMAL LOW (ref 12.0–15.0)
MCH: 26.3 pg (ref 26.0–34.0)
MCHC: 29.5 g/dL — ABNORMAL LOW (ref 30.0–36.0)
MCV: 89.2 fL (ref 78.0–100.0)
Platelets: 178 10*3/uL (ref 150–400)
RBC: 3.69 MIL/uL — AB (ref 3.87–5.11)
RDW: 17.4 % — ABNORMAL HIGH (ref 11.5–15.5)
WBC: 14.3 10*3/uL — ABNORMAL HIGH (ref 4.0–10.5)

## 2017-11-16 LAB — URINALYSIS, COMPLETE (UACMP) WITH MICROSCOPIC
Bilirubin Urine: NEGATIVE
Glucose, UA: NEGATIVE mg/dL
KETONES UR: NEGATIVE mg/dL
Nitrite: NEGATIVE
PH: 6 (ref 5.0–8.0)
Protein, ur: NEGATIVE mg/dL
SPECIFIC GRAVITY, URINE: 1.01 (ref 1.005–1.030)

## 2017-11-16 LAB — GLUCOSE, CAPILLARY
GLUCOSE-CAPILLARY: 293 mg/dL — AB (ref 65–99)
Glucose-Capillary: 168 mg/dL — ABNORMAL HIGH (ref 65–99)
Glucose-Capillary: 206 mg/dL — ABNORMAL HIGH (ref 65–99)
Glucose-Capillary: 264 mg/dL — ABNORMAL HIGH (ref 65–99)

## 2017-11-16 SURGERY — OPEN REDUCTION INTERNAL FIXATION (ORIF) DISTAL FEMUR FRACTURE
Anesthesia: General | Laterality: Left

## 2017-11-16 MED ORDER — SODIUM CHLORIDE 0.9 % IV SOLN: 25.0000 mg | INTRAVENOUS | Status: DC | PRN

## 2017-11-16 MED ORDER — METHYLPREDNISOLONE SODIUM SUCC 40 MG IJ SOLR
40.0000 mg | Freq: Once | INTRAMUSCULAR | Status: AC
  Administered 2017-11-16: 40 mg via INTRAVENOUS
  Filled 2017-11-16: qty 1

## 2017-11-16 MED ORDER — FUROSEMIDE 10 MG/ML IJ SOLN
40.0000 mg | Freq: Once | INTRAMUSCULAR | Status: AC
  Administered 2017-11-16: 40 mg via INTRAVENOUS
  Filled 2017-11-16: qty 4

## 2017-11-16 MED ORDER — FLUTICASONE PROPIONATE 50 MCG/ACT NA SUSP
1.0000 | Freq: Every day | NASAL | Status: DC
  Administered 2017-11-16 – 2017-11-19 (×3): 1 via NASAL
  Filled 2017-11-16: qty 16

## 2017-11-16 MED ORDER — METHYLPREDNISOLONE SODIUM SUCC 40 MG IJ SOLR: 20.0000 mg | Freq: Once | INTRAMUSCULAR | Status: DC

## 2017-11-16 MED ORDER — SODIUM CHLORIDE 0.9 % IV SOLN
Freq: Once | INTRAVENOUS | Status: AC
  Administered 2017-11-18: 08:00:00 via INTRAVENOUS

## 2017-11-16 MED ORDER — DIPHENHYDRAMINE HCL 50 MG/ML IJ SOLN
25.0000 mg | INTRAMUSCULAR | Status: DC | PRN
  Administered 2017-11-16 – 2017-11-19 (×3): 25 mg via INTRAVENOUS
  Filled 2017-11-16 (×3): qty 1

## 2017-11-16 MED ORDER — PHYTONADIONE 5 MG PO TABS
5.0000 mg | ORAL_TABLET | Freq: Once | ORAL | Status: AC
  Administered 2017-11-16: 5 mg via ORAL
  Filled 2017-11-16: qty 1

## 2017-11-16 MED ORDER — POVIDONE-IODINE 10 % EX SWAB: 2.0000 "application " | Freq: Once | CUTANEOUS | Status: DC

## 2017-11-16 MED ORDER — LORATADINE 10 MG PO TABS
10.0000 mg | ORAL_TABLET | Freq: Every day | ORAL | Status: DC
  Administered 2017-11-16 – 2017-11-21 (×5): 10 mg via ORAL
  Filled 2017-11-16 (×5): qty 1

## 2017-11-16 MED ORDER — CHLORHEXIDINE GLUCONATE 4 % EX LIQD: 60.0000 mL | Freq: Once | CUTANEOUS | Status: DC

## 2017-11-16 NOTE — Progress Notes (Signed)
Infusing FFP, patient complained of itching. Paged MD, obtained an order for Benadryl. Benadryl administered. Breathing treatment administered.

## 2017-11-16 NOTE — Progress Notes (Signed)
Inpatient Diabetes Program Recommendations  AACE/ADA: New Consensus Statement on Inpatient Glycemic Control (2015)  Target Ranges:  Prepandial:   less than 140 mg/dL      [place] postprandial:   less than 180 mg/dL (1-2 hours)      Critically ill patients:  140 - 180 mg/dL   Lab Results  Component Value Date   GLUCAP 206 (H) 11/16/2017   HGBA1C 7.8 (H) 11/14/2017    Diabetes history: Type 2 DM Outpatient Diabetes medications: Amaryl 2 mg with breakfast, Humalog 30-40 units tid, Tradjenta 5 mg daily Current orders for Inpatient glycemic control:  Novolog moderate tid with meals  Inpatient Diabetes Program Recommendations:    Please consider adding Lantus 15 units daily. Also consider increasing frequency of Novolog correction to q 4 hours once patient is NPO.   Thanks, Adah Perl, RN, BC-ADM Inpatient Diabetes Coordinator Pager [phone] (8a-5p)

## 2017-11-16 NOTE — Progress Notes (Signed)
1345 Patient is resting comfortably in bed. O2 level has improved and no complaints of itching.

## 2017-11-16 NOTE — Progress Notes (Signed)
Patient experiencing shortness of breath. MD at bedside. IV solumedrol and Lasix administered.

## 2017-11-16 NOTE — Anesthesia Preprocedure Evaluation (Deleted)
Anesthesia Evaluation  Patient identified by MRN, date of birth, ID band Patient awake    Reviewed: Allergy & Precautions, H&P , NPO status , Patient's Chart, lab work & pertinent test results, reviewed documented beta blocker date and time   History of Anesthesia Complications Negative for: history of anesthetic complications  Airway Mallampati: I  TM Distance: >3 FB Neck ROM: full    Dental  (+) Caps, Missing, Poor Dentition, Dental Advidsory Given   Pulmonary shortness of breath and with exertion, sleep apnea and Continuous Positive Airway Pressure Ventilation , COPD, former smoker,    breath sounds clear to auscultation       Cardiovascular Exercise Tolerance: Good hypertension, + dysrhythmias Atrial Fibrillation  Rhythm:Regular Rate:Normal     Neuro/Psych Anxiety negative neurological ROS     GI/Hepatic Neg liver ROS, GERD  Medicated,  Endo/Other  diabetes, Type 2, Insulin DependentHypothyroidism Morbid obesity  Renal/GU CRFRenal disease  negative genitourinary   Musculoskeletal   Abdominal (+) + obese,   Peds  Hematology  (+) Blood dyscrasia, anemia ,   Anesthesia Other Findings   Reproductive/Obstetrics                             Anesthesia Physical  Anesthesia Plan  ASA: IV  Anesthesia Plan: General   Post-op Pain Management:    Induction: Intravenous  PONV Risk Score and Plan: 4 or greater and Ondansetron, Dexamethasone, Treatment may vary due to age or medical condition and Midazolam  Airway Management Planned: Oral ETT  Additional Equipment: None  Intra-op Plan:   Post-operative Plan: Extubation in OR  Informed Consent: I have reviewed the patients History and Physical, chart, labs and discussed the procedure including the risks, benefits and alternatives for the proposed anesthesia with the patient or authorized representative who has indicated his/her  understanding and acceptance.   Dental advisory given  Plan Discussed with: Anesthesiologist and CRNA  Anesthesia Plan Comments:         Anesthesia Quick Evaluation

## 2017-11-16 NOTE — Progress Notes (Signed)
PROGRESS NOTE  GISEL VIPOND VEL:[id number] DOB: [birth year] DOA: 11/14/2017 PCP: Sofie Hartigan, MD  HPI/Interval History  YASMIN BRONAUGH is a 62 y.o. year old female with medical history significant for PAF on coumadin, diastolic CHF, HTN, CKD, stage III (baseline creatinine: 1.6-1.8), T2DM , OSA not on CPAP who presented to [facility] on 4/8 as a transfer from [facility] ED for a  left distal femur comminuted fracture after mechanical fall.  Per patient she tripped on bedside comforter and landed on her left knee. Prior to this she was completely independent with her ADLs and iADLs.   She is adherent with all her chronic medications; the only change she made was an extra dose of metolazone prior to admission for extra fluid build up she noticed.    Subjective She has been sob , and itching after staring FFP  Assessment/Plan: Principal Problem:   Fracture Active Problems:   Chronic diastolic heart failure (HCC)   HTN (hypertension)   Diabetes (HCC)   Obstructive sleep apnea   Iron deficiency anemia   CKD (chronic kidney disease)   Hypothyroidism   Atrial fibrillation (HCC)   Anxiety state   Peripheral neuropathy  Left comminuted distal fracture, after mechanical fall -Patient tripped after foot got tangled in comforter on bed -X-ray (OSH) Right distal comminuted fracture with anterior displacement, joint effusion -EDP spoke with Dr. Altamese [place] (orthopedic), seen by orthopedics. 9, surgery on hold because of elevated INR, currently getting FFP for INR of  2.2 -Ortho for possible ORIF  4/10   - PT eval after surgery -Pain control:  IV morphine every 4 hours as needed severe pain, oxycodone 5-10 mg every 4 hours as needed moderate breakthrough pain - Bowel regimen: miralax scheduled, PRN senna  Hypokalemia Likely related to diuretics , currently on hold ( metolazone and torsemide) Mg wnl BMP stable  Leukocytosis, improving Most likely Stress related to acute  fracture Without infectious signs or symptoms and afebrile s/p timed IVF and daily CBC, UA & CXR neg, blood cx NGTD  Atrial fibrillation, rate controlled -INR trend: 2.29--2.2--2.13 -Holding Coumadin therapy in setting of upcoming surgery -Continue home amiodarone and metoprolol  Resume Coumadin after  surgery if okay with orthopedics Due to itching , hypoxia FFP held, will need to reverse INR with vitamin K and recheck INR in am   Diastolic CHF, euvolemic on exam - minimal edema of right ankle on exam - resume diuretics post surgery - daily weights, I/Os, closely monitor volume status  CKD, stage 3, stable - creatinine stable at baseline  Chronic IDA, stable hgb 10.9, will obtain type and screen No longer on oral iron Gets infusion therapy as outpatient Monitor daily on cbc  T2DM, A1c 7.8%(11/2017) Fairly large doses of humalog at home Add lantus 10 U to am today only, hold long acting tomorrow for possible surgery Sliding scale Novolog, monitor CBG  Hypothyroidism,stable TSH 2.7(09/2017) Home Synthroid  Vertigo, chronic, stable PRN home antivert  GERD, chronic, stable protonix  Hyperlipidemia Home pravastatin  Code Status: Full code   Family Communication: no family at bedside   Disposition Plan: possible ORIF  today pending INR improvement  Consultants:  Orthopedic  Procedures:  None  Antimicrobials:  None   Cultures:  Blood cultures x 2 ( 4/8)  Telemetry:  DVT prophylaxis: INR still therapeutic, then SCDs   Objective: Vitals:   11/15/17 2330 11/16/17 0517 11/16/17 0854 11/16/17 0956  BP: 118/85 112/68 136/67 134/62  Pulse: 64 61 67  61  Resp: 18 18  18   Temp: 98.4 F (36.9 C) 98.2 F (36.8 C) 98.2 F (36.8 C) 98.2 F (36.8 C)  TempSrc: Oral Oral Oral Oral  SpO2: 99% 99% 93% 99%    Intake/Output Summary (Last 24 hours) at 11/16/2017 1016 Last data filed at 11/16/2017 0930 Gross per 24 hour  Intake 1454.75 ml  Output 2625 ml    Net -1170.25 ml   There were no vitals filed for this visit.  Exam:  Constitutional:morbidly obese, normal appearing female Eyes: EOMI, anicteric, normal conjunctivae ENMT: Oropharynx with moist mucous membranes, normal dentition Neck: FROM,  Cardiovascular: RRR no MRGs, with no peripheral edema Respiratory: Normal respiratory effort on room air, clear breath sounds  Abdomen: Soft,non-tender, with no appreciable HSM Skin: No rash ulcers, or lesions. Without skin tenting  MSK: Right knee bandaged and immobilized, right ankle warm, distal pulses appreciated Neuro: Moving all extremities (except right leg), no appreciable focal deficits Psych: Alert, oriented to person, place, time, context  Data Reviewed: CBC: Recent Labs  Lab 11/14/17 1158 11/14/17 1839 11/15/17 0402 11/16/17 0552  WBC 15.8* 17.8* 15.7* 14.3*  HGB 13.0 11.8* 10.9* 9.7*  HCT 40.3 38.1 35.2* 32.9*  MCV 83.0 86.2 86.3 89.2  PLT 214 229 218 378   Basic Metabolic Panel: Recent Labs  Lab 11/14/17 1030 11/14/17 1158 11/14/17 1839 11/15/17 0402 11/16/17 0552  NA  --  138 139 139 140  K  --  2.6* 2.7* 3.3* 3.9  CL  --  87* 87* 91* 92*  CO2  --  36* 31 33* 35*  GLUCOSE  --  197* 265* 219* 194*  BUN  --  56* 51* 51* 43*  CREATININE  --  1.72* 1.81* 1.72* 1.59*  CALCIUM  --  9.2 9.2 8.6* 8.3*  MG 2.0  --   --   --   --    GFR: Estimated Creatinine Clearance: 51.8 mL/min (A) (by C-G formula based on SCr of 1.59 mg/dL (H)). Liver Function Tests: Recent Labs  Lab 11/14/17 1158  AST 30  ALT 20  ALKPHOS 77  BILITOT 0.7  PROT 8.3*  ALBUMIN 3.8   No results for input(s): LIPASE, AMYLASE in the last 168 hours. No results for input(s): AMMONIA in the last 168 hours. Coagulation Profile: Recent Labs  Lab 11/14/17 1158 11/14/17 1839 11/15/17 0402 11/16/17 0552  INR 2.29 2.29 2.13 2.20   Cardiac Enzymes: No results for input(s): CKTOTAL, CKMB, CKMBINDEX, TROPONINI in the last 168 hours. BNP (last  3 results) No results for input(s): PROBNP in the last 8760 hours. HbA1C: Recent Labs    11/14/17 1839  HGBA1C 7.8*   CBG: Recent Labs  Lab 11/15/17 0745 11/15/17 1202 11/15/17 1721 11/16/17 0749  GLUCAP 257* 198* 177* 168*   Lipid Profile: No results for input(s): CHOL, HDL, LDLCALC, TRIG, CHOLHDL, LDLDIRECT in the last 72 hours. Thyroid Function Tests: No results for input(s): TSH, T4TOTAL, FREET4, T3FREE, THYROIDAB in the last 72 hours. Anemia Panel: No results for input(s): VITAMINB12, FOLATE, FERRITIN, TIBC, IRON, RETICCTPCT in the last 72 hours. Urine analysis:    Component Value Date/Time   COLORURINE STRAW (A) 11/14/2017 1814   APPEARANCEUR CLEAR 11/14/2017 1814   LABSPEC 1.009 11/14/2017 1814   PHURINE 7.0 11/14/2017 1814   GLUCOSEU 50 (A) 11/14/2017 1814   HGBUR MODERATE (A) 11/14/2017 1814   BILIRUBINUR NEGATIVE 11/14/2017 1814   KETONESUR NEGATIVE 11/14/2017 1814   PROTEINUR NEGATIVE 11/14/2017 1814   NITRITE NEGATIVE 11/14/2017  [place] (A) 11/14/2017 1814   Sepsis Labs: @LABRCNTIP (procalcitonin:4,lacticidven:4)  ) Recent Results (from the past 240 hour(s))  Culture, blood (Routine X 2) w Reflex to ID Panel     Status: None (Preliminary result)   Collection Time: 11/14/17  6:53 PM  Result Value Ref Range Status   Specimen Description BLOOD LEFT ANTECUBITAL  Final   Special Requests   Final    BOTTLES DRAWN AEROBIC AND ANAEROBIC Blood Culture adequate volume   Culture   Final    NO GROWTH < 24 HOURS Performed at [facility], [place] [address], [place], [place] [address]    Report Status PENDING  Incomplete  Culture, blood (Routine X 2) w Reflex to ID Panel     Status: None (Preliminary result)   Collection Time: 11/14/17  6:57 PM  Result Value Ref Range Status   Specimen Description BLOOD RIGHT WRIST  Final   Special Requests   Final    BOTTLES DRAWN AEROBIC AND ANAEROBIC Blood Culture adequate volume   Culture   Final     NO GROWTH < 24 HOURS Performed at [facility], [place] [address] [place], [place] [address]    Report Status PENDING  Incomplete  Surgical pcr screen     Status: None   Collection Time: 11/15/17  5:47 AM  Result Value Ref Range Status   MRSA, PCR NEGATIVE NEGATIVE Final   Staphylococcus aureus NEGATIVE NEGATIVE Final    Comment: (NOTE) The Xpert SA Assay (FDA approved for NASAL specimens in patients 43 years of age and older), is one component of a comprehensive surveillance program. It is not intended to diagnose infection nor to guide or monitor treatment. Performed at [facility], [place] [address], [place], [place] [address]       Studies: Ct Knee Left Wo Contrast  Result Date: 11/15/2017 CLINICAL DATA:  Patient tripped on comfort or falling on the left knee. Femoral fracture. EXAM: CT OF THE LEFT KNEE WITHOUT CONTRAST TECHNIQUE: Multidetector CT imaging of the LEFT knee was performed according to the standard protocol. Multiplanar CT image reconstructions were also generated. COMPARISON:  11/14/2017 radiographs FINDINGS: Bones/Joint/Cartilage Acute comminuted supracondylar fracture of the left femoral metaphysis is noted. An sagittal component is seen traversing the medial femoral condyle along its medial aspect sparing the intercondylar notch and extending into the knee joint. 3 mm of offset is seen along the medial articular surface due to this fracture. The femoral condyles are 1/2 to 3/4 shaft width dorsally displaced relative to the femoral diaphysis. There is a 2.3 x 0.9 x 2.8 cm fracture fragment in the suprapatellar compartment of the knee, displaced anteriorly relative to the lateral femoral condyle. Slight impaction of the femoral diaphysis is noted on the femoral condyles. Small to moderate joint effusion is noted. Degenerative subchondral cystic change of the patella and trochlea. Osteoarthritis of the femorotibial compartment with near bone-on-bone apposition of the  lateral femoral condyle with lateral tibial plateau. No tibial plateau fracture. The proximal fibula appears intact. Ligaments Suboptimally assessed by CT. Muscles and Tendons No muscle atrophy.  No acute intramuscular hemorrhage. Soft tissues Periarticular soft tissue swelling more so along the anterolateral subcutaneous soft tissues. IMPRESSION: 1. Acute T-shaped supracondylar fracture of the left femur with impaction of the femoral diaphysis on the femoral condyles, anterior displacement between 1/2 to 3/4 shaft width and with sagittal component traversing the medial femoral condyle just peripheral to the intercondylar notch. 2. Associated small  suprapatellar joint effusion. 3. 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 4. Posttraumatic soft tissue swelling is seen about the anterolateral aspect of the knee. Electronically Signed   By: Ashley Royalty M.D.   On: 11/15/2017 19:27    Scheduled Meds: . amiodarone  200 mg Oral Daily  . chlorhexidine  60 mL Topical Once  . citalopram  20 mg Oral Daily  . cloNIDine  0.1 mg Oral BID  . gabapentin  100 mg Oral Daily  . insulin aspart  0-15 Units Subcutaneous TID WC  . levothyroxine  175 mcg Oral QAC breakfast  . pantoprazole  40 mg Oral BID  . polyethylene glycol  17 g Oral Daily  . povidone-iodine  2 application Topical Once  . pravastatin  20 mg Oral q1800    Continuous Infusions: . sodium chloride    .  ceFAZolin (ANCEF) IV    . diphenhydrAMINE       LOS: 2 days     Reyne Dumas, MD [facility]    If 7PM-7AM, please contact night-coverage [email or web address] Password [email or web address] 11/16/2017, 10:16 AM

## 2017-11-17 ENCOUNTER — Encounter (HOSPITAL_COMMUNITY): Payer: Self-pay | Admitting: Certified Registered Nurse Anesthetist

## 2017-11-17 ENCOUNTER — Inpatient Hospital Stay (HOSPITAL_COMMUNITY): Payer: Medicare Other

## 2017-11-17 ENCOUNTER — Inpatient Hospital Stay (HOSPITAL_COMMUNITY): Payer: Medicare Other | Admitting: Anesthesiology

## 2017-11-17 ENCOUNTER — Encounter (HOSPITAL_COMMUNITY)
Admission: AD | Disposition: A | Discharge status: Skilled Nursing Facility | Payer: Self-pay | Source: Other Acute Inpatient Hospital | Attending: Internal Medicine

## 2017-11-17 DIAGNOSIS — S72462A Displaced supracondylar fracture with intracondylar extension of lower end of left femur, initial encounter for closed fracture: Secondary | ICD-10-CM

## 2017-11-17 DIAGNOSIS — I10 Essential (primary) hypertension: Secondary | ICD-10-CM

## 2017-11-17 DIAGNOSIS — F411 Generalized anxiety disorder: Secondary | ICD-10-CM

## 2017-11-17 DIAGNOSIS — E1122 Type 2 diabetes mellitus with diabetic chronic kidney disease: Secondary | ICD-10-CM

## 2017-11-17 DIAGNOSIS — T148XXA Other injury of unspecified body region, initial encounter: Secondary | ICD-10-CM

## 2017-11-17 DIAGNOSIS — Z794 Long term (current) use of insulin: Secondary | ICD-10-CM

## 2017-11-17 DIAGNOSIS — D509 Iron deficiency anemia, unspecified: Secondary | ICD-10-CM

## 2017-11-17 DIAGNOSIS — N183 Chronic kidney disease, stage 3 (moderate): Secondary | ICD-10-CM

## 2017-11-17 DIAGNOSIS — G4733 Obstructive sleep apnea (adult) (pediatric): Secondary | ICD-10-CM

## 2017-11-17 DIAGNOSIS — I481 Persistent atrial fibrillation: Secondary | ICD-10-CM

## 2017-11-17 HISTORY — PX: ORIF FEMUR FRACTURE: SHX2119

## 2017-11-17 LAB — TRANSFUSION REACTION
DAT C3: NEGATIVE
Post RXN DAT IgG: NEGATIVE

## 2017-11-17 LAB — BPAM FFP
Blood Product Expiration Date: 201904122359
ISSUE DATE / TIME: 201904100856
UNIT TYPE AND RH: 7300

## 2017-11-17 LAB — PROTIME-INR
INR: 1.47
PROTHROMBIN TIME: 17.7 s — AB (ref 11.4–15.2)

## 2017-11-17 LAB — PREPARE FRESH FROZEN PLASMA: Unit division: 0

## 2017-11-17 LAB — CBC
HCT: 31 % — ABNORMAL LOW (ref 36.0–46.0)
HEMOGLOBIN: 9.5 g/dL — AB (ref 12.0–15.0)
MCH: 27.3 pg (ref 26.0–34.0)
MCHC: 30.6 g/dL (ref 30.0–36.0)
MCV: 89.1 fL (ref 78.0–100.0)
Platelets: 178 10*3/uL (ref 150–400)
RBC: 3.48 MIL/uL — AB (ref 3.87–5.11)
RDW: 17.3 % — ABNORMAL HIGH (ref 11.5–15.5)
WBC: 14.7 10*3/uL — AB (ref 4.0–10.5)

## 2017-11-17 LAB — BASIC METABOLIC PANEL
ANION GAP: 12 (ref 5–15)
BUN: 44 mg/dL — ABNORMAL HIGH (ref 6–20)
CALCIUM: 8.2 mg/dL — AB (ref 8.9–10.3)
CO2: 36 mmol/L — ABNORMAL HIGH (ref 22–32)
Chloride: 91 mmol/L — ABNORMAL LOW (ref 101–111)
Creatinine, Ser: 1.53 mg/dL — ABNORMAL HIGH (ref 0.44–1.00)
GFR, EST AFRICAN AMERICAN: 41 mL/min — AB (ref >60)
GFR, EST NON AFRICAN AMERICAN: 35 mL/min — AB (ref >60)
Glucose, Bld: 198 mg/dL — ABNORMAL HIGH (ref 65–99)
Potassium: 3.8 mmol/L (ref 3.5–5.1)
SODIUM: 139 mmol/L (ref 135–145)

## 2017-11-17 LAB — GLUCOSE, CAPILLARY
GLUCOSE-CAPILLARY: 246 mg/dL — AB (ref 65–99)
Glucose-Capillary: 213 mg/dL — ABNORMAL HIGH (ref 65–99)
Glucose-Capillary: 325 mg/dL — ABNORMAL HIGH (ref 65–99)
Glucose-Capillary: 221 mg/dL — ABNORMAL HIGH (ref 65–99)

## 2017-11-17 SURGERY — OPEN REDUCTION INTERNAL FIXATION (ORIF) DISTAL FEMUR FRACTURE
Anesthesia: General | Laterality: Left

## 2017-11-17 MED ORDER — OXYCODONE HCL 5 MG PO TABS: 5.0000 mg | ORAL_TABLET | Freq: Once | ORAL | Status: DC | PRN

## 2017-11-17 MED ORDER — HYDROMORPHONE HCL 1 MG/ML IJ SOLN
INTRAMUSCULAR | Status: AC
  Administered 2017-11-17: 0.5 mg via INTRAVENOUS
  Filled 2017-11-17: qty 1

## 2017-11-17 MED ORDER — IPRATROPIUM-ALBUTEROL 20-100 MCG/ACT IN AERS
INHALATION_SPRAY | RESPIRATORY_TRACT | Status: DC | PRN
  Administered 2017-11-17: 8 via RESPIRATORY_TRACT

## 2017-11-17 MED ORDER — VANCOMYCIN HCL 1000 MG IV SOLR
INTRAVENOUS | Status: AC
Start: 2017-11-17 — End: 2017-11-17
  Filled 2017-11-17: qty 1000

## 2017-11-17 MED ORDER — ONDANSETRON HCL 4 MG/2ML IJ SOLN
INTRAMUSCULAR | Status: AC
  Filled 2017-11-17: qty 2

## 2017-11-17 MED ORDER — MIDAZOLAM HCL 5 MG/5ML IJ SOLN
INTRAMUSCULAR | Status: DC | PRN
  Administered 2017-11-17: 2 mg via INTRAVENOUS

## 2017-11-17 MED ORDER — PROPOFOL 10 MG/ML IV BOLUS
INTRAVENOUS | Status: DC | PRN
  Administered 2017-11-17: 160 mg via INTRAVENOUS

## 2017-11-17 MED ORDER — AMIODARONE HCL 200 MG PO TABS
200.0000 mg | ORAL_TABLET | Freq: Every day | ORAL | Status: DC
  Administered 2017-11-17 – 2017-11-21 (×5): 200 mg via ORAL
  Filled 2017-11-17 (×5): qty 1

## 2017-11-17 MED ORDER — ONDANSETRON HCL 4 MG/2ML IJ SOLN
INTRAMUSCULAR | Status: DC | PRN
  Administered 2017-11-17: 4 mg via INTRAVENOUS

## 2017-11-17 MED ORDER — EPHEDRINE SULFATE 50 MG/ML IJ SOLN
INTRAMUSCULAR | Status: DC | PRN
  Administered 2017-11-17 (×3): 10 mg via INTRAVENOUS

## 2017-11-17 MED ORDER — DEXTROSE 5 % IV SOLN
INTRAVENOUS | Status: DC | PRN
  Administered 2017-11-17: 3 g via INTRAVENOUS

## 2017-11-17 MED ORDER — SUCCINYLCHOLINE CHLORIDE 20 MG/ML IJ SOLN
INTRAMUSCULAR | Status: DC | PRN
  Administered 2017-11-17: 150 mg via INTRAVENOUS

## 2017-11-17 MED ORDER — ALBUTEROL SULFATE HFA 108 (90 BASE) MCG/ACT IN AERS
INHALATION_SPRAY | RESPIRATORY_TRACT | Status: AC
  Filled 2017-11-17: qty 6.7

## 2017-11-17 MED ORDER — VANCOMYCIN HCL 1000 MG IV SOLR
INTRAVENOUS | Status: AC
  Filled 2017-11-17: qty 1000

## 2017-11-17 MED ORDER — SUGAMMADEX SODIUM 500 MG/5ML IV SOLN
INTRAVENOUS | Status: DC | PRN
  Administered 2017-11-17: 300 mg via INTRAVENOUS

## 2017-11-17 MED ORDER — VANCOMYCIN HCL 1000 MG IV SOLR
INTRAVENOUS | Status: DC | PRN
  Administered 2017-11-17: 1000 mg

## 2017-11-17 MED ORDER — FENTANYL CITRATE (PF) 100 MCG/2ML IJ SOLN: 25.0000 ug | INTRAMUSCULAR | Status: DC | PRN

## 2017-11-17 MED ORDER — FENTANYL CITRATE (PF) 100 MCG/2ML IJ SOLN
INTRAMUSCULAR | Status: DC | PRN
  Administered 2017-11-17: 100 ug via INTRAVENOUS
  Administered 2017-11-17 (×2): 50 ug via INTRAVENOUS

## 2017-11-17 MED ORDER — INSULIN ASPART 100 UNIT/ML ~~LOC~~ SOLN
3.0000 [IU] | Freq: Once | SUBCUTANEOUS | Status: AC
  Administered 2017-11-17: 3 [IU] via SUBCUTANEOUS

## 2017-11-17 MED ORDER — HYDROMORPHONE HCL 1 MG/ML IJ SOLN
INTRAMUSCULAR | Status: AC
  Filled 2017-11-17: qty 1

## 2017-11-17 MED ORDER — EPHEDRINE 5 MG/ML INJ
INTRAVENOUS | Status: AC
  Filled 2017-11-17: qty 10

## 2017-11-17 MED ORDER — PROMETHAZINE HCL 25 MG/ML IJ SOLN: 6.2500 mg | INTRAMUSCULAR | Status: DC | PRN

## 2017-11-17 MED ORDER — PHENYLEPHRINE 40 MCG/ML (10ML) SYRINGE FOR IV PUSH (FOR BLOOD PRESSURE SUPPORT)
PREFILLED_SYRINGE | INTRAVENOUS | Status: AC
  Filled 2017-11-17: qty 10

## 2017-11-17 MED ORDER — LACTATED RINGERS IV SOLN
INTRAVENOUS | Status: DC | PRN
  Administered 2017-11-17: 07:00:00 via INTRAVENOUS

## 2017-11-17 MED ORDER — CEFAZOLIN SODIUM-DEXTROSE 2-4 GM/100ML-% IV SOLN
2.0000 g | Freq: Three times a day (TID) | INTRAVENOUS | Status: AC
  Administered 2017-11-17 – 2017-11-18 (×3): 2 g via INTRAVENOUS
  Filled 2017-11-17 (×3): qty 100

## 2017-11-17 MED ORDER — TOBRAMYCIN SULFATE 1.2 G IJ SOLR
INTRAMUSCULAR | Status: DC | PRN
  Administered 2017-11-17: 1.2 g

## 2017-11-17 MED ORDER — MIDAZOLAM HCL 2 MG/2ML IJ SOLN
INTRAMUSCULAR | Status: AC
  Filled 2017-11-17: qty 2

## 2017-11-17 MED ORDER — OXYCODONE HCL 5 MG PO TABS
ORAL_TABLET | ORAL | Status: AC
  Filled 2017-11-17: qty 2

## 2017-11-17 MED ORDER — HYDROMORPHONE HCL 1 MG/ML IJ SOLN
0.2500 mg | INTRAMUSCULAR | Status: DC | PRN
  Administered 2017-11-17 (×4): 0.5 mg via INTRAVENOUS

## 2017-11-17 MED ORDER — PHENYLEPHRINE HCL 10 MG/ML IJ SOLN
INTRAMUSCULAR | Status: DC | PRN
  Administered 2017-11-17 (×2): 80 ug via INTRAVENOUS
  Administered 2017-11-17: 120 ug via INTRAVENOUS

## 2017-11-17 MED ORDER — SUGAMMADEX SODIUM 500 MG/5ML IV SOLN
INTRAVENOUS | Status: AC
  Filled 2017-11-17: qty 5

## 2017-11-17 MED ORDER — OXYCODONE HCL 5 MG/5ML PO SOLN: 5.0000 mg | Freq: Once | ORAL | Status: DC | PRN

## 2017-11-17 MED ORDER — ALBUMIN HUMAN 5 % IV SOLN
INTRAVENOUS | Status: DC | PRN
  Administered 2017-11-17 (×2): via INTRAVENOUS

## 2017-11-17 MED ORDER — TOBRAMYCIN SULFATE 1.2 G IJ SOLR
INTRAMUSCULAR | Status: AC
  Filled 2017-11-17: qty 1.2

## 2017-11-17 MED ORDER — ROCURONIUM BROMIDE 100 MG/10ML IV SOLN
INTRAVENOUS | Status: DC | PRN
  Administered 2017-11-17: 40 mg via INTRAVENOUS

## 2017-11-17 MED ORDER — PROPOFOL 10 MG/ML IV BOLUS
INTRAVENOUS | Status: AC
  Filled 2017-11-17: qty 20

## 2017-11-17 MED ORDER — LIDOCAINE HCL (CARDIAC) 20 MG/ML IV SOLN
INTRAVENOUS | Status: DC | PRN
  Administered 2017-11-17: 80 mg via INTRAVENOUS

## 2017-11-17 MED ORDER — 0.9 % SODIUM CHLORIDE (POUR BTL) OPTIME
TOPICAL | Status: DC | PRN
  Administered 2017-11-17: 1000 mL

## 2017-11-17 MED ORDER — FENTANYL CITRATE (PF) 250 MCG/5ML IJ SOLN
INTRAMUSCULAR | Status: AC
  Filled 2017-11-17: qty 5

## 2017-11-17 SURGICAL SUPPLY — 78 items
BANDAGE ELASTIC 6 VELCRO ST LF (GAUZE/BANDAGES/DRESSINGS) ×1 IMPLANT
BIT DRILL 4.3 (BIT) ×2
BIT DRILL 4.3X300MM (BIT) IMPLANT
BIT DRILL LONG 3.3 (BIT) ×1 IMPLANT
BIT DRILL QC 3.3X195 (BIT) ×1 IMPLANT
BLADE CLIPPER SURG (BLADE) IMPLANT
BNDG COHESIVE 6X5 TAN STRL LF (GAUZE/BANDAGES/DRESSINGS) ×2 IMPLANT
BNDG ELASTIC 6X10 VLCR STRL LF (GAUZE/BANDAGES/DRESSINGS) ×2 IMPLANT
BRUSH SCRUB SURG 4.25 DISP (MISCELLANEOUS) ×4 IMPLANT
CANISTER SUCT 3000ML PPV (MISCELLANEOUS) ×2 IMPLANT
CANISTER WOUND CARE 500ML ATS (WOUND CARE) ×1 IMPLANT
CAP LOCK NCB (Cap) ×8 IMPLANT
CHLORAPREP W/TINT 26ML (MISCELLANEOUS) ×3 IMPLANT
COVER SURGICAL LIGHT HANDLE (MISCELLANEOUS) ×2 IMPLANT
DRAPE C-ARM 42X72 X-RAY (DRAPES) ×2 IMPLANT
DRAPE C-ARMOR (DRAPES) ×2 IMPLANT
DRAPE ORTHO SPLIT 77X108 STRL (DRAPES) ×2
DRAPE PROXIMA HALF (DRAPES) ×4 IMPLANT
DRAPE SURG 17X23 STRL (DRAPES) ×2 IMPLANT
DRAPE SURG ORHT 6 SPLT 77X108 (DRAPES) ×2 IMPLANT
DRAPE U-SHAPE 47X51 STRL (DRAPES) ×2 IMPLANT
DRSG ADAPTIC 3X8 NADH LF (GAUZE/BANDAGES/DRESSINGS) IMPLANT
DRSG MEPILEX BORDER 4X12 (GAUZE/BANDAGES/DRESSINGS) IMPLANT
DRSG MEPILEX BORDER 4X4 (GAUZE/BANDAGES/DRESSINGS) ×2 IMPLANT
DRSG MEPILEX BORDER 4X8 (GAUZE/BANDAGES/DRESSINGS) ×1 IMPLANT
DRSG PAD ABDOMINAL 8X10 ST (GAUZE/BANDAGES/DRESSINGS) ×5 IMPLANT
ELECT REM PT RETURN 9FT ADLT (ELECTROSURGICAL) ×2
ELECTRODE REM PT RTRN 9FT ADLT (ELECTROSURGICAL) ×1 IMPLANT
FLUID NSS /IRRIG 3000 ML XXX (IV SOLUTION) ×1 IMPLANT
GAUZE SPONGE 4X4 12PLY STRL (GAUZE/BANDAGES/DRESSINGS) ×1 IMPLANT
GLOVE BIO SURGEON STRL SZ7.5 (GLOVE) ×8 IMPLANT
GLOVE BIOGEL PI IND STRL 7.5 (GLOVE) ×1 IMPLANT
GLOVE BIOGEL PI INDICATOR 7.5 (GLOVE) ×1
GOWN STRL REUS W/ TWL LRG LVL3 (GOWN DISPOSABLE) ×3 IMPLANT
GOWN STRL REUS W/TWL LRG LVL3 (GOWN DISPOSABLE) ×3
HANDPIECE INTERPULSE COAX TIP (DISPOSABLE) ×1
K-WIRE 2.0 (WIRE) ×2
K-WIRE FXSTD 280X2XNS SS (WIRE) ×2
K-WIRE SMOOTH (WIRE) ×4
KIT BASIN OR (CUSTOM PROCEDURE TRAY) ×2 IMPLANT
KIT PREVENA INCISION MGT20CM45 (CANNISTER) ×1 IMPLANT
KIT TURNOVER KIT B (KITS) ×2 IMPLANT
KWIRE FXSTD 280X2XNS SS (WIRE) IMPLANT
KWIRE SMOOTH (WIRE) IMPLANT
NS IRRIG 1000ML POUR BTL (IV SOLUTION) ×2 IMPLANT
PACK TOTAL JOINT (CUSTOM PROCEDURE TRAY) ×2 IMPLANT
PAD ARMBOARD 7.5X6 YLW CONV (MISCELLANEOUS) ×4 IMPLANT
PAD CAST 4YDX4 CTTN HI CHSV (CAST SUPPLIES) ×1 IMPLANT
PADDING CAST COTTON 4X4 STRL (CAST SUPPLIES)
PADDING CAST COTTON 6X4 STRL (CAST SUPPLIES) ×1 IMPLANT
PLATE DIST FEM 12H (Plate) ×1 IMPLANT
SCREW 5.0 80MM (Screw) ×1 IMPLANT
SCREW CORT NCB SELFTAP 5.0X42 (Screw) ×2 IMPLANT
SCREW CORTICAL 5.0X10MM (Screw) ×1 IMPLANT
SCREW CORTICAL NCB 5.0X90MM (Screw) ×1 IMPLANT
SCREW LAG CANN CANC 5.0 20X80 (Screw) ×1 IMPLANT
SCREW LAG CANN CANC 5.0 20X85 (Screw) ×1 IMPLANT
SCREW NCB 4.0X40MM (Screw) ×1 IMPLANT
SCREW NCB 5.0X46MM (Screw) ×1 IMPLANT
SCREW NCB 5.0X85MM (Screw) ×2 IMPLANT
SET HNDPC FAN SPRY TIP SCT (DISPOSABLE) IMPLANT
SPACER CENTRALIZER STEM 3X64 (Spacer) ×1 IMPLANT
SPONGE LAP 18X18 X RAY DECT (DISPOSABLE) IMPLANT
STAPLER VISISTAT 35W (STAPLE) ×2 IMPLANT
SUCTION FRAZIER HANDLE 10FR (MISCELLANEOUS) ×1
SUCTION TUBE FRAZIER 10FR DISP (MISCELLANEOUS) ×1 IMPLANT
SUT ETHILON 3 0 PS 1 (SUTURE) ×5 IMPLANT
SUT VIC AB 0 CT1 27 (SUTURE)
SUT VIC AB 0 CT1 27XBRD ANBCTR (SUTURE) IMPLANT
SUT VIC AB 1 CT1 27 (SUTURE) ×1
SUT VIC AB 1 CT1 27XBRD ANBCTR (SUTURE) IMPLANT
SUT VIC AB 2-0 CT1 27 (SUTURE) ×3
SUT VIC AB 2-0 CT1 TAPERPNT 27 (SUTURE) ×2 IMPLANT
SUT VIC AB 3-0 CT1 27 (SUTURE) ×1
SUT VIC AB 3-0 CT1 27XBRD (SUTURE) IMPLANT
TOWEL OR 17X26 10 PK STRL BLUE (TOWEL DISPOSABLE) ×4 IMPLANT
TRAY FOLEY W/METER SILVER 16FR (SET/KITS/TRAYS/PACK) IMPLANT
WATER STERILE IRR 1000ML POUR (IV SOLUTION) ×4 IMPLANT

## 2017-11-17 NOTE — Consult Note (Addendum)
WOC requested to troubleshoot leaking Vac dressing.   Bedside nurse states machine was alarming since pt arrived from PACU. Pt went to the OR this afternoon and Prevena dressing was applied to left knee wound and is attached to hospital Vac machine at 174mm cont suction with Vac drape in place.  Troubleshooting performed for 45 min and unable to locate the leak.  Attached a new cannister, new track pad, and barrier ring around Vac spone site.  Suction is maintained at 12mm when machine turned on but it continues to read and alarm "leak,"  Called Dr Doreatha Martin of the ortho team to inform him that I was unable to correct the alarm; ordered to place it to 180mm cont wall suction.  Sponge shrinks down and good seal is obtained on the wall suction. Replaced ace wrap over the location to left knee.  Pt was medicated for pain prior to the procedure and tolerated with mod amt discomfort.  Please refer to ortho team for further questions. Please re-consult if further assistance is needed.  Thank-you,  Julien Girt MSN, [place], [place], [place], [place]

## 2017-11-17 NOTE — Anesthesia Preprocedure Evaluation (Signed)
Anesthesia Evaluation  Patient identified by MRN, date of birth, ID band Patient awake    Reviewed: Allergy & Precautions, NPO status , Patient's Chart, lab work & pertinent test results  Airway Mallampati: III  TM Distance: <3 FB Neck ROM: Full    Dental no notable dental hx.    Pulmonary sleep apnea , COPD, former smoker,    breath sounds clear to auscultation + decreased breath sounds      Cardiovascular hypertension, Normal cardiovascular exam+ dysrhythmias Atrial Fibrillation  Rhythm:Regular Rate:Normal     Neuro/Psych negative neurological ROS  negative psych ROS   GI/Hepatic Neg liver ROS, GERD  ,  Endo/Other  diabetesHypothyroidism   Renal/GU Renal InsufficiencyRenal disease  negative genitourinary   Musculoskeletal negative musculoskeletal ROS (+)   Abdominal   Peds negative pediatric ROS (+)  Hematology  (+) anemia ,   Anesthesia Other Findings   Reproductive/Obstetrics negative OB ROS                             Anesthesia Physical Anesthesia Plan  ASA: IV  Anesthesia Plan: General   Post-op Pain Management:    Induction: Intravenous  PONV Risk Score and Plan: 3 and Ondansetron and Treatment may vary due to age or medical condition  Airway Management Planned: Oral ETT  Additional Equipment:   Intra-op Plan:   Post-operative Plan: Possible Post-op intubation/ventilation  Informed Consent: I have reviewed the patients History and Physical, chart, labs and discussed the procedure including the risks, benefits and alternatives for the proposed anesthesia with the patient or authorized representative who has indicated his/her understanding and acceptance.   Dental advisory given  Plan Discussed with: CRNA and Surgeon  Anesthesia Plan Comments:         Anesthesia Quick Evaluation

## 2017-11-17 NOTE — Anesthesia Postprocedure Evaluation (Signed)
Anesthesia Post Note  Patient: Andrea Rivers  Procedure(s) Performed: OPEN REDUCTION INTERNAL FIXATION (ORIF) DISTAL FEMUR FRACTURE (Left )     Patient location during evaluation: PACU Anesthesia Type: General Level of consciousness: awake and alert Pain management: pain level controlled Vital Signs Assessment: post-procedure vital signs reviewed and stable Respiratory status: spontaneous breathing, nonlabored ventilation, respiratory function stable and patient connected to nasal cannula oxygen Cardiovascular status: blood pressure returned to baseline and stable Postop Assessment: no apparent nausea or vomiting Anesthetic complications: no    Last Vitals:  Vitals:   11/17/17 1245 11/17/17 1255  BP: (!) 119/52   Pulse: 85 90  Resp: (!) 24 15  Temp:    SpO2: 100% 100%    Last Pain:  Vitals:   11/17/17 1255  TempSrc:   PainSc: 5                  Irving Bloor S

## 2017-11-17 NOTE — Transfer of Care (Signed)
Immediate Anesthesia Transfer of Care Note  Patient: Andrea Rivers  Procedure(s) Performed: OPEN REDUCTION INTERNAL FIXATION (ORIF) DISTAL FEMUR FRACTURE (Left )  Patient Location: PACU  Anesthesia Type:General  Level of Consciousness: awake, alert  and oriented  Airway & Oxygen Therapy: Patient Spontanous Breathing and Patient connected to nasal cannula oxygen  Post-op Assessment: Report given to RN, Post -op Vital signs reviewed and stable and Patient moving all extremities X 4  Post vital signs: Reviewed and stable  Last Vitals:  Vitals Value Taken Time  BP 103/43 11/17/2017 12:16 PM  Temp 36.6 C 11/17/2017 12:15 PM  Pulse 90 11/17/2017 12:23 PM  Resp 23 11/17/2017 12:23 PM  SpO2 100 % 11/17/2017 12:23 PM  Vitals shown include unvalidated device data.  Last Pain:  Vitals:   11/17/17 1215  TempSrc:   PainSc: 0-No pain      Patients Stated Pain Goal: 2 (96/28/36 6294)  Complications: No apparent anesthesia complications

## 2017-11-17 NOTE — Progress Notes (Signed)
PT Cancellation Note  Patient Details Name: Andrea Rivers MRN: [id number] DOB: October 02, 1955   Cancelled Treatment:    Reason Eval/Treat Not Completed: Patient at procedure or test/unavailable.  Pt is in surgery.  PT will check back tomorrow.  Thanks,    Barbarann Ehlers. [place], [place], DPT [phone]   11/17/2017, 9:19 AM

## 2017-11-17 NOTE — Progress Notes (Signed)
PROGRESS NOTE    Andrea Rivers  BOF:[id number] DOB: 03/03/1956 DOA: 11/14/2017 PCP: Sofie Hartigan, MD   Brief Narrative:  Andrea Rivers is a 62 y.o. year old female with medical history significant for PAF on coumadin, diastolic CHF, HTN, CKD, stage III (baseline creatinine: 1.6-1.8), T2DM , OSA not on CPAP who presented to [facility] on 4/8 as a transfer from [facility] ED for a  left distal femur comminuted fracture after mechanical fall.  Per patient she tripped on bedside comforter and landed on her left knee. Prior to this she was completely independent with her ADLs and iADLs.  She is adherent with all her chronic medications; the only change she made was an extra dose of metolazone prior to admission for extra fluid build up she noticed. Admitted and underwent Surgery today and is doing well post-operatively.   Assessment & Plan:   Principal Problem:   Fracture Active Problems:   Chronic diastolic heart failure (HCC)   HTN (hypertension)   Diabetes (HCC)   Obstructive sleep apnea   Iron deficiency anemia   CKD (chronic kidney disease)   Hypothyroidism   Atrial fibrillation (HCC)   Anxiety state   Peripheral neuropathy  Left comminuted distal fracture, after mechanical fall s/p ORIF POD1 -Patient tripped after foot got tangled in comforter on bed -X-ray (OSH) Right distal comminuted fracture with anterior displacement, joint effusion -EDP spoke with Dr. Altamese Warner (orthopedic), seen by orthopedics. 4/9, surgery on hold because of elevated INR, currently getting FFP for INR of  2.2 -Ortho and ORIF done today   -PT eval after surgery -Pain control:  IV morphine every 4 hours as needed severe pain, oxycodone 5-10 mg every 4 hours as needed moderate breakthrough pain - Bowel regimen: miralax scheduled, PRN senna - Further Care per Ortho and resume Coumadin when ok with Ortho  Hypokalemia -Likely related to diuretics , currently on hold ( metolazone and  torsemide) -K+ now 3.9 -Continue to Monitor and Replete as Necessary -Repeat CMP in AM  Leukocytosis, improving -Most likely Stress related to acute fracture -Without infectious signs or symptoms and afebrile -s/p timed IVF and daily CBC, UA & CXR neg, blood cx NGTD -WBC went from 14.3 -> 14.7 -Continue to Monitor   Atrial fibrillation, rate controlled -INR trended down and now 1.47 -Held Coumadin therapy in setting of upcoming surgery -Continue home amiodarone and metoprolol -Resume Coumadin after  surgery if okay with orthopedics Due to itching , hypoxia FFP held, will need to reverse INR with vitamin K and recheck INR in am  -INR now 7.78  Diastolic CHF, euvolemic on exam -minimal edema of right ankle on exam - esume diuretics post surgery in AM - daily weights, I/Os, closely monitor volume status  CKD, stage 3, stable - creatinine stable at baseline and BUN/Cr now 44/1.53  Chronic IDA, stable -Hb/Hct now dropped to 9.5/31.0 -No longer on oral iron -Gets infusion therapy as outpatient -Monitor daily on cbc  T2DM, A1c 7.8%(11/2017) -Fairly large doses of humalog at home -Add lantus 10 U to am today only, hold long acting -tomorrow for possible surgery -Sliding scale Novolog, monitor CBG -CBG's have been elevated post-operatively and ranging from 213-325 -Adjust as necessary -Consult Diabetes Coordinator   Hypothyroidism,stable -TSH 2.7(09/2017) -C/w Levothyroxine 175 mcg po Daily   Vertigo, chronic, stable -C/w Meclizine 12.5 mg po TIDprn Dizziness  GERD, chronic, stable -C/w Pantoprazole 40 mg po BID   Hyperlipidemia -C/w Home Pravastatin 20 mg po daily  DVT prophylaxis: Resume Coumadin when ok with Ortho Code Status: FULL CODE Family Communication: no Family present at bedside Disposition Plan: Likely SNF  Consultants:   Orthopedic Surgery    Procedures: ORIF   Antimicrobials:  Anti-infectives (From admission, onward)   Start      Dose/Rate Route Frequency Ordered Stop   11/17/17 1100  tobramycin (NEBCIN) powder  Status:  Discontinued       As needed 11/17/17 1122 11/17/17 1207   11/17/17 0902  vancomycin (VANCOCIN) powder  Status:  Discontinued       As needed 11/17/17 0903 11/17/17 1207   11/16/17 1500  ceFAZolin (ANCEF) 3 g in dextrose 5 % 50 mL IVPB  Status:  Discontinued     3 g 130 mL/hr over 30 Minutes Intravenous To ShortStay Surgical 11/15/17 1317 11/17/17 1332   11/16/17 1400  ceFAZolin (ANCEF) 3 g in dextrose 5 % 50 mL IVPB  Status:  Discontinued     3 g 130 mL/hr over 30 Minutes Intravenous On call to O.R. 11/15/17 1848 11/15/17 1853     Subjective: Seen and examined at bedside after Surgery and stated she was in pain. No SOB. No N/V. Wanting to move legs around to prevent Blood Clots.   Objective: Vitals:   11/17/17 1255 11/17/17 1300 11/17/17 1315 11/17/17 1322  BP:   (!) 112/31   Pulse: 90 88 98   Resp: 15 18 12    Temp:    98.5 F (36.9 C)  TempSrc:      SpO2: 100% 97% 96%     Intake/Output Summary (Last 24 hours) at 11/17/2017 1340 Last data filed at 11/17/2017 1133 Gross per 24 hour  Intake 1960 ml  Output 1925 ml  Net 35 ml   There were no vitals filed for this visit.  Examination: Physical Exam:  Constitutional: WN/WD morbidly obese Caucasian female in  NAD and appears calm and comfortable Eyes: Lids and conjunctivae normal, sclerae anicteric  ENMT: External Ears, Nose appear normal. Grossly normal hearing. Mucous membranes are moist. Neck: Appears normal, supple, no cervical masses, normal ROM, no appreciable thyromegaly, no JVD Respiratory: Diminished to auscultation bilaterally, no wheezing, rales, rhonchi or crackles. Normal respiratory effort and patient is not tachypenic. No accessory muscle use.  Cardiovascular: Irregularly Irregular, no murmurs / rubs / gallops. S1 and S2 auscultated.  Abdomen: Soft, non-tender, Distended due to body habitus. No masses palpated. No  appreciable hepatosplenomegaly. Bowel sounds positive.  GU: Deferred. Musculoskeletal: Left Leg wrapped with Wound Vac attached Skin: No rashes, lesions, ulcers on a limited skin eval. No induration; Warm and dry.  Neurologic: CN 2-12 grossly intact with no focal deficits. Romberg sign and cerebellar reflexes not assessed.  Psychiatric: Normal judgment and insight. Alert and oriented x 3. Normal mood and appropriate affect.   Data Reviewed: I have personally reviewed following labs and imaging studies  CBC: Recent Labs  Lab 11/14/17 1158 11/14/17 1839 11/15/17 0402 11/16/17 0552 11/17/17 0647  WBC 15.8* 17.8* 15.7* 14.3* 14.7*  HGB 13.0 11.8* 10.9* 9.7* 9.5*  HCT 40.3 38.1 35.2* 32.9* 31.0*  MCV 83.0 86.2 86.3 89.2 89.1  PLT 214 229 218 178 774   Basic Metabolic Panel: Recent Labs  Lab 11/14/17 1030 11/14/17 1158 11/14/17 1839 11/15/17 0402 11/16/17 0552 11/17/17 0647  NA  --  138 139 139 140 139  K  --  2.6* 2.7* 3.3* 3.9 3.8  CL  --  87* 87* 91* 92* 91*  CO2  --  36*  31 33* 35* 36*  GLUCOSE  --  197* 265* 219* 194* 198*  BUN  --  56* 51* 51* 43* 44*  CREATININE  --  1.72* 1.81* 1.72* 1.59* 1.53*  CALCIUM  --  9.2 9.2 8.6* 8.3* 8.2*  MG 2.0  --   --   --   --   --    GFR: Estimated Creatinine Clearance: 53.9 mL/min (A) (by C-G formula based on SCr of 1.53 mg/dL (H)). Liver Function Tests: Recent Labs  Lab 11/14/17 1158  AST 30  ALT 20  ALKPHOS 77  BILITOT 0.7  PROT 8.3*  ALBUMIN 3.8   No results for input(s): LIPASE, AMYLASE in the last 168 hours. No results for input(s): AMMONIA in the last 168 hours. Coagulation Profile: Recent Labs  Lab 11/15/17 0402 11/16/17 0552 11/16/17 1119 11/16/17 1920 11/17/17 0647  INR 2.13 2.20 2.08 1.78 1.47   Cardiac Enzymes: No results for input(s): CKTOTAL, CKMB, CKMBINDEX, TROPONINI in the last 168 hours. BNP (last 3 results) No results for input(s): PROBNP in the last 8760 hours. HbA1C: Recent Labs     11/14/17 1839  HGBA1C 7.8*   CBG: Recent Labs  Lab 11/16/17 1155 11/16/17 1717 11/16/17 2200 11/17/17 1058 11/17/17 1216  GLUCAP 206* 264* 293* 213* 246*   Lipid Profile: No results for input(s): CHOL, HDL, LDLCALC, TRIG, CHOLHDL, LDLDIRECT in the last 72 hours. Thyroid Function Tests: No results for input(s): TSH, T4TOTAL, FREET4, T3FREE, THYROIDAB in the last 72 hours. Anemia Panel: No results for input(s): VITAMINB12, FOLATE, FERRITIN, TIBC, IRON, RETICCTPCT in the last 72 hours. Sepsis Labs: No results for input(s): PROCALCITON, LATICACIDVEN in the last 168 hours.  Recent Results (from the past 240 hour(s))  Culture, blood (Routine X 2) w Reflex to ID Panel     Status: None (Preliminary result)   Collection Time: 11/14/17  6:53 PM  Result Value Ref Range Status   Specimen Description BLOOD LEFT ANTECUBITAL  Final   Special Requests   Final    BOTTLES DRAWN AEROBIC AND ANAEROBIC Blood Culture adequate volume   Culture   Final    NO GROWTH 2 DAYS Performed at [facility], [address], [place], [place] [address]    Report Status PENDING  Incomplete  Culture, blood (Routine X 2) w Reflex to ID Panel     Status: None (Preliminary result)   Collection Time: 11/14/17  6:57 PM  Result Value Ref Range Status   Specimen Description BLOOD RIGHT WRIST  Final   Special Requests   Final    BOTTLES DRAWN AEROBIC AND ANAEROBIC Blood Culture adequate volume   Culture   Final    NO GROWTH 2 DAYS Performed at [facility], [place] [address] [place], [place] [address]    Report Status PENDING  Incomplete  Surgical pcr screen     Status: None   Collection Time: 11/15/17  5:47 AM  Result Value Ref Range Status   MRSA, PCR NEGATIVE NEGATIVE Final   Staphylococcus aureus NEGATIVE NEGATIVE Final    Comment: (NOTE) The Xpert SA Assay (FDA approved for NASAL specimens in patients 32 years of age and older), is one component of a comprehensive surveillance program. It is  not intended to diagnose infection nor to guide or monitor treatment. Performed at [facility], [place] [address] [place], [place] [address]     Radiology Studies: Ct Knee Left Wo Contrast  Result Date: 11/15/2017 CLINICAL DATA:  Patient tripped  on comfort or falling on the left knee. Femoral fracture. EXAM: CT OF THE LEFT KNEE WITHOUT CONTRAST TECHNIQUE: Multidetector CT imaging of the LEFT knee was performed according to the standard protocol. Multiplanar CT image reconstructions were also generated. COMPARISON:  11/14/2017 radiographs FINDINGS: Bones/Joint/Cartilage Acute comminuted supracondylar fracture of the left femoral metaphysis is noted. An sagittal component is seen traversing the medial femoral condyle along its medial aspect sparing the intercondylar notch and extending into the knee joint. 3 mm of offset is seen along the medial articular surface due to this fracture. The femoral condyles are 1/2 to 3/4 shaft width dorsally displaced relative to the femoral diaphysis. There is a 2.3 x 0.9 x 2.8 cm fracture fragment in the suprapatellar compartment of the knee, displaced anteriorly relative to the lateral femoral condyle. Slight impaction of the femoral diaphysis is noted on the femoral condyles. Small to moderate joint effusion is noted. Degenerative subchondral cystic change of the patella and trochlea. Osteoarthritis of the femorotibial compartment with near bone-on-bone apposition of the lateral femoral condyle with lateral tibial plateau. No tibial plateau fracture. The proximal fibula appears intact. Ligaments Suboptimally assessed by CT. Muscles and Tendons No muscle atrophy.  No acute intramuscular hemorrhage. Soft tissues Periarticular soft tissue swelling more so along the anterolateral subcutaneous soft tissues. IMPRESSION: 1. Acute T-shaped supracondylar fracture of the left femur with impaction of the femoral diaphysis on the femoral condyles, anterior displacement between 1/2  to 3/4 shaft width and with sagittal component traversing the medial femoral condyle just peripheral to the intercondylar notch. 2. Associated small suprapatellar joint effusion. 3. 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 4. Posttraumatic soft tissue swelling is seen about the anterolateral aspect of the knee. Electronically Signed   By: Ashley Royalty M.D.   On: 11/15/2017 19:27   Dg C-arm 1-60 Min  Result Date: 11/17/2017 CLINICAL DATA:  ORIF of distal femoral fracture EXAM: LEFT FEMUR 2 VIEWS; DG C-ARM 61-120 MIN COMPARISON:  11/15/2017 FLUOROSCOPY TIME:  Fluoroscopy Time:  3 minutes 50 seconds Radiation Exposure Index (if provided by the fluoroscopic device): Not available Number of Acquired Spot Images: 8 FINDINGS: The previously seen distal femoral fracture is again identified on the initial images. Fixation screws are noted traversing the distal most aspect of the femur. Subsequent fixation sideplate is noted. Multiple fixation screws are seen. The fracture fragments are in near anatomic alignment. IMPRESSION: ORIF of distal femoral fracture Electronically Signed   By: Inez Catalina M.D.   On: 11/17/2017 11:32   Dg C-arm 1-60 Min  Result Date: 11/17/2017 CLINICAL DATA:  ORIF of distal femoral fracture EXAM: LEFT FEMUR 2 VIEWS; DG C-ARM 61-120 MIN COMPARISON:  11/15/2017 FLUOROSCOPY TIME:  Fluoroscopy Time:  3 minutes 50 seconds Radiation Exposure Index (if provided by the fluoroscopic device): Not available Number of Acquired Spot Images: 8 FINDINGS: The previously seen distal femoral fracture is again identified on the initial images. Fixation screws are noted traversing the distal most aspect of the femur. Subsequent fixation sideplate is noted. Multiple fixation screws are seen. The fracture fragments are in near anatomic alignment. IMPRESSION: ORIF of distal femoral fracture Electronically Signed   By: Inez Catalina M.D.   On: 11/17/2017 11:32   Dg C-arm 1-60 Min  Result  Date: 11/17/2017 CLINICAL DATA:  ORIF of distal femoral fracture EXAM: LEFT FEMUR 2 VIEWS; DG C-ARM 61-120 MIN COMPARISON:  11/15/2017 FLUOROSCOPY TIME:  Fluoroscopy Time:  3 minutes 50 seconds Radiation Exposure Index (if provided by the fluoroscopic  device): Not available Number of Acquired Spot Images: 8 FINDINGS: The previously seen distal femoral fracture is again identified on the initial images. Fixation screws are noted traversing the distal most aspect of the femur. Subsequent fixation sideplate is noted. Multiple fixation screws are seen. The fracture fragments are in near anatomic alignment. IMPRESSION: ORIF of distal femoral fracture Electronically Signed   By: Inez Catalina M.D.   On: 11/17/2017 11:32   Dg Femur Min 2 Views Left  Result Date: 11/17/2017 CLINICAL DATA:  ORIF of distal femoral fracture EXAM: LEFT FEMUR 2 VIEWS; DG C-ARM 61-120 MIN COMPARISON:  11/15/2017 FLUOROSCOPY TIME:  Fluoroscopy Time:  3 minutes 50 seconds Radiation Exposure Index (if provided by the fluoroscopic device): Not available Number of Acquired Spot Images: 8 FINDINGS: The previously seen distal femoral fracture is again identified on the initial images. Fixation screws are noted traversing the distal most aspect of the femur. Subsequent fixation sideplate is noted. Multiple fixation screws are seen. The fracture fragments are in near anatomic alignment. IMPRESSION: ORIF of distal femoral fracture Electronically Signed   By: Inez Catalina M.D.   On: 11/17/2017 11:32   Dg Femur Port Min 2 Views Left  Result Date: 11/17/2017 CLINICAL DATA:  Status post ORIF of distal femoral fracture EXAM: LEFT FEMUR PORTABLE 2 VIEWS COMPARISON:  None. FINDINGS: Fixation sideplate and multiple fixation screws are noted. Fracture fragments are in near anatomic alignment. No other focal abnormality is noted. IMPRESSION: Status post ORIF of distal femoral fracture Electronically Signed   By: Inez Catalina M.D.   On: 11/17/2017 13:35    Scheduled Meds: . amiodarone  200 mg Oral Daily  . citalopram  20 mg Oral Daily  . cloNIDine  0.1 mg Oral BID  . fluticasone  1 spray Each Nare Daily  . gabapentin  100 mg Oral Daily  . HYDROmorphone      . insulin aspart  0-15 Units Subcutaneous TID WC  . levothyroxine  175 mcg Oral QAC breakfast  . loratadine  10 mg Oral Daily  . oxyCODONE      . pantoprazole  40 mg Oral BID  . polyethylene glycol  17 g Oral Daily  . pravastatin  20 mg Oral q1800   Continuous Infusions: . sodium chloride      LOS: 3 days   Kerney Elbe, DO [facility] Pager [phone]  If 7PM-7AM, please contact night-coverage [email or web address] Password [email or web address] 11/17/2017, 1:40 PM

## 2017-11-17 NOTE — Op Note (Signed)
OrthopaedicSurgeryOperativeNote (ALP:[id number]) Date of Surgery: 11/17/2017  Admit Date: 11/14/2017   Diagnoses: Pre-Op Diagnoses: Left intra-articular distal femur fracture  Post-Op Diagnosis: Same  Procedures: 1. CPT 27513-Open reduction internal fixation of left distal femur fracture 2. CPT 97605-Incisional wound vac placement  Surgeons: Primary: Shona Needles, MD   Location:[facility] OR ROOM 03   AnesthesiaGeneral   Antibiotics:Ancef 3g preop   Tourniquettime:* No tourniquets in log * .  [id number]:297 mL   Complications:None  Specimens:None  Implants: Implant Name Type Inv. Item Serial No. Manufacturer Lot No. LRB No. Used Action  PLATE DIST FEM 98X - [id number] Plate PLATE DIST FEM 81K  ZIMMER [employer](ORTH,TRAU,BIO,SG)  Left 1 Implanted  SCREW LAG CANN CANC 5.0 20X80 - [id number] Screw SCREW LAG CANN CANC 5.0 20X80  [employer](ORTH,TRAU,BIO,SG)  Left 1 Implanted  SCREW LAG CANN CANC 5.0 20X85 - [id number] Screw SCREW LAG CANN CANC 5.0 20X85  Z[employer](ORTH,TRAU,BIO,SG)  Left 1 Implanted  SCREW CORTICAL NCB 5.0X90MM - [id number] Screw SCREW CORTICAL NCB 5.0X90MM  Z[employer]MER [employer]ON(ORTH,TRAU,BIO,SG) [id number] Left 1 Implanted  SCREW CORTICAL 5.0X10MM - [id number] Screw SCREW CORTICAL 5.0X10MM  [employer](ORTH,TRAU,BIO,SG)  Left 1 Implanted  SCREW NCB 4.0X40MM - [id number] Screw SCREW NCB 4.0X40MM  ZIMMER [employer]ON(ORTH,TRAU,BIO,SG)  Left 1 Implanted  SCREW CORT NCB SELFTAP 5.0X42 - [id number] Screw SCREW CORT NCB SELFTAP 5.0X42  [employer](ORTH,TRAU,BIO,SG)  Left 2 Implanted  SCREW NCB 5.0X46MM - [id number] Screw SCREW NCB 5.0X46MM  Z[employer](ORTH,TRAU,BIO,SG)  Left 1 Implanted  SCREW 5.0 80MM - [id number] Screw SCREW 5.0 80MM  Z[employer]MER [employer]ON(ORTH,TRAU,BIO,SG)  Left 1 Implanted  SCREW NCB 5.0X85MM - [id number] Screw SCREW NCB 5.0X85MM  [employer](ORTH,TRAU,BIO,SG)  Left 2 Implanted  CAP LOCK NCB - [id number] Cap CAP LOCK NCB  [employer](ORTH,TRAU,BIO,SG)  Left 8  Implanted    IndicationsforSurgery: 62 year old female with multiple medical problems including diabetes, a-fib on coumadin, COPD, and CHF. She sustained a left intra-articular distal femur fracture status post a ground-level fall.  She was transferred from [facility] where it was felt that an orthopedic traumatologist was needed to take over her care.  I felt that proceeding with open reduction internal fixation is most appropriate.  We had to wait for her INR to come down appropriately.  She received 2 units of FFP and eventual it come down under 1.5 to allow for surgery.  I discussed risks and benefits with the patient and she agreed to proceed with surgery. Risks discussed included bleeding requiring blood transfusion, bleeding causing a hematoma, infection, malunion, nonunion, damage to surrounding nerves and blood vessels, pain, hardware prominence or irritation, hardware failure, stiffness, post-traumatic arthritis, DVT/PE, compartment syndrome, and even death. The patient agreed to proceed with surgery and consent was obtained.  Operative Findings: Comminuted supracondylar left distal femur fracture with simple intra-articular split along the medial condyle treated with open reduction internal fixation with independent lag fixation of the intercondylar split using cannulated screws.  Supracondylar female was treated with bridging fixation using a Zimmer NCB distal femoral locking plate.  Procedure: The patient was identified in the preoperative holding area. Consent was confirmed with the patient and family and all questions were answered. The operative extremity was marked and they were then brought back to the operating room by our anesthesia colleagues. The patient was transferred to a radiolucent flattop table and a bump was placed under operative hip. The left lower extremity was prepped and draped in sterile fashion. A timeout was performed to verify the patient, the procedure and  the extremity. Preoperative antibiotics were dosed.  Fluoroscopy was used to take injury films. A standard lateral parapatellar approach to the distal femur was made. An incision was carried down through skin and subcutaneous tissue. The IT band was identified and this was split in line with our incision and this split was carried just lateral to the patellar tendon and extended down to the proximal portion of the tibia. The infrapatellar and suprapatellar fat pads were excised to fully visualize the articular surface and the fracture. Z-shaped retractors were used to retract the patella and quadriceps out of the way.  Here I was able to visualize the intra-articular split along the medial condyle.  A freer was used to enter the fracture and free the two condyles from one another. Irrigation and suction was used to clean the fracture further.  I used a Cobb elevator to help manipulate the condyles into reduction.  Once the rotation appeared correct then a large pointed reduction tenaculum was used to compress the fracture. Two K-wires were used to provisionally hold the reduction as well. AP and lateral fluoroscopic views were obtained to confirm anatomic reduction.  Using lateral and AP fluoro a threaded guidepin was placed to direct and place a cannulated screw just posterior to the trochlea. A percutaneous incision was made posterior to place a another cannulated screw  proximal and anterior to the posterior aspect of Blumenstat's line and it was directed across both condyles on both AP and lateral views. Once compression was obtained with these screws the clamps were removed and I turned my attention to placement of the distal femoral locking plate.  A 12-hole Zimmer NCB was placed on the insertion jig and slid submuscularly under the vastus to the proximal portion of the femur. A 2.83mm K-wire was placed in the central hole to confirm adequate alignment to the femoral articular condyles. A percutaneous  incision was made at the top of the plate to place a 6.7YP drill bit to hold the position of the proximal part of the plate. Once I confirmed the position of the plate I proceeded to place three screws in the distal articular fragment.  To bring the distal aspect of the plate flush to bone.  I attempted to place a bicortical 5.45mm nonlocking screw in the shaft to bring the plate flush to bone.  However it appeared that the fracture was now reduced with this technique.  I felt that the shaft was translated laterally although the alignment was appropriate.  I also felt that the rotation was appropriate compared to the contralateral leg.  I then removed the screw that I had placed.  More distally in the shaft I placed a 10 mm screw through the plate to push the shaft medial.  This was able to improve the translation of the shaft compared to the distal articular segment.  I then returned to the proximal femoral shaft and we drilled and placed 5.0 millimeter screws.  I removed the 3.3 mm drill bit and placed a 4.0 millimeter screw.  I placed locking caps on the 5.0 millimeter screws.  I left the last screw a nonlocking screw to prevent a significant stress riser.  Two more screws were placed in the distal segment . The targeting jig was removed and locking caps were placed on all of the distal screws. Final fluoro images were obtained. The incisions were thoroughly irrigated. One gram of vancomycin powder and 1.2 g of tobramycin powder was placed along the plate and in the  incision. The wound was then closed with 0 Vicryl for the IT band, 2-0 vicryl, 3-0 nylon.  The remainder of the percutaneous incisions were closed with 3-0 nylon suture.  A Praveena wound VAC was placed as the patient was on anticoagulation and is at high risk for drainage and infection.  Mepilex dressings were placed over the other incisions.  The Ace wrap was used to provide compression over the distal femur.  The patient was then awoken from  anesthesia and taken to the PACU in stable condition.  Post Op Plan/Instructions: The patient will be nonweightbearing to the left lower extremity.  I will allow for unrestricted range of motion.  I would like her anticoagulation to be held until postoperative day 2.  She will continue on the Coumadin at that point.  She will mobilize with physical and occupational therapy.  She will receive postoperative Ancef for antibiotic prophylaxis.  I was present and performed the entire surgery.  Katha Hamming, MD [facility]

## 2017-11-17 NOTE — Interval H&P Note (Signed)
History and Physical Interval Note:  11/17/2017 7:46 AM  Andrea Rivers  has presented today for surgery, with the diagnosis of left distal femur fracture  The various methods of treatment have been discussed with the patient and family. After consideration of risks, benefits and other options for treatment, the patient has consented to  Procedure(s): OPEN REDUCTION INTERNAL FIXATION (ORIF) DISTAL FEMUR FRACTURE (Left) as a surgical intervention .  The patient's history has been reviewed, patient examined, no change in status, stable for surgery.  I have reviewed the patient's chart and labs.  Questions were answered to the patient's satisfaction.     Lennette Bihari P Haddix

## 2017-11-17 NOTE — Anesthesia Procedure Notes (Signed)
Procedure Name: Intubation Date/Time: 11/17/2017 8:10 AM Performed by: Inda Coke, CRNA Pre-anesthesia Checklist: Patient identified, Emergency Drugs available, Suction available and Patient being monitored Patient Re-evaluated:Patient Re-evaluated prior to induction Oxygen Delivery Method: Circle System Utilized Preoxygenation: Pre-oxygenation with 100% oxygen Induction Type: IV induction Ventilation: Mask ventilation without difficulty and Oral airway inserted - appropriate to patient size Laryngoscope Size: Mac and 3 Grade View: Grade I Tube type: Oral Tube size: 7.5 mm Number of attempts: 1 Airway Equipment and Method: Stylet and Oral airway Placement Confirmation: ETT inserted through vocal cords under direct vision,  positive ETCO2 and breath sounds checked- equal and bilateral Secured at: 22 cm Tube secured with: Tape Dental Injury: Teeth and Oropharynx as per pre-operative assessment

## 2017-11-18 DIAGNOSIS — D62 Acute posthemorrhagic anemia: Secondary | ICD-10-CM

## 2017-11-18 DIAGNOSIS — I5032 Chronic diastolic (congestive) heart failure: Secondary | ICD-10-CM

## 2017-11-18 LAB — CBC WITH DIFFERENTIAL/PLATELET
BAND NEUTROPHILS: 0 %
BLASTS: 0 %
Basophils Absolute: 0 10*3/uL (ref 0.0–0.1)
Basophils Relative: 0 %
Eosinophils Absolute: 0 10*3/uL (ref 0.0–0.7)
Eosinophils Relative: 0 %
HEMATOCRIT: 24.6 % — AB (ref 36.0–46.0)
Hemoglobin: 7.7 g/dL — ABNORMAL LOW (ref 12.0–15.0)
LYMPHS PCT: 5 %
Lymphs Abs: 0.8 10*3/uL (ref 0.7–4.0)
MCH: 27.8 pg (ref 26.0–34.0)
MCHC: 31.3 g/dL (ref 30.0–36.0)
MCV: 88.8 fL (ref 78.0–100.0)
MONOS PCT: 8 %
Metamyelocytes Relative: 0 %
Monocytes Absolute: 1.2 10*3/uL — ABNORMAL HIGH (ref 0.1–1.0)
Myelocytes: 0 %
NEUTROS ABS: 13.1 10*3/uL — AB (ref 1.7–7.7)
Neutrophils Relative %: 87 %
OTHER: 0 %
Platelets: 173 10*3/uL (ref 150–400)
Promyelocytes Relative: 0 %
RBC: 2.77 MIL/uL — AB (ref 3.87–5.11)
RDW: 17.4 % — AB (ref 11.5–15.5)
WBC: 15.1 10*3/uL — AB (ref 4.0–10.5)
nRBC: 0 /100 WBC

## 2017-11-18 LAB — COMPREHENSIVE METABOLIC PANEL
ALT: 13 U/L — ABNORMAL LOW (ref 14–54)
ANION GAP: 14 (ref 5–15)
AST: 25 U/L (ref 15–41)
Albumin: 2.9 g/dL — ABNORMAL LOW (ref 3.5–5.0)
Alkaline Phosphatase: 58 U/L (ref 38–126)
BILIRUBIN TOTAL: 0.8 mg/dL (ref 0.3–1.2)
BUN: 50 mg/dL — ABNORMAL HIGH (ref 6–20)
CO2: 31 mmol/L (ref 22–32)
Calcium: 7.8 mg/dL — ABNORMAL LOW (ref 8.9–10.3)
Chloride: 89 mmol/L — ABNORMAL LOW (ref 101–111)
Creatinine, Ser: 1.79 mg/dL — ABNORMAL HIGH (ref 0.44–1.00)
GFR calc non Af Amer: 29 mL/min — ABNORMAL LOW (ref >60)
GFR, EST AFRICAN AMERICAN: 34 mL/min — AB (ref >60)
Glucose, Bld: 231 mg/dL — ABNORMAL HIGH (ref 65–99)
POTASSIUM: 3.2 mmol/L — AB (ref 3.5–5.1)
Sodium: 134 mmol/L — ABNORMAL LOW (ref 135–145)
TOTAL PROTEIN: 6.4 g/dL — AB (ref 6.5–8.1)

## 2017-11-18 LAB — GLUCOSE, CAPILLARY
GLUCOSE-CAPILLARY: 227 mg/dL — AB (ref 65–99)
GLUCOSE-CAPILLARY: 239 mg/dL — AB (ref 65–99)
GLUCOSE-CAPILLARY: 321 mg/dL — AB (ref 65–99)
Glucose-Capillary: 347 mg/dL — ABNORMAL HIGH (ref 65–99)

## 2017-11-18 LAB — PHOSPHORUS: PHOSPHORUS: 2.9 mg/dL (ref 2.5–4.6)

## 2017-11-18 LAB — PROTIME-INR
INR: 1.3
Prothrombin Time: 16 seconds — ABNORMAL HIGH (ref 11.4–15.2)

## 2017-11-18 LAB — MAGNESIUM: MAGNESIUM: 1.8 mg/dL (ref 1.7–2.4)

## 2017-11-18 LAB — PREPARE RBC (CROSSMATCH)

## 2017-11-18 MED ORDER — METHOCARBAMOL 500 MG PO TABS
500.0000 mg | ORAL_TABLET | Freq: Four times a day (QID) | ORAL | Status: DC | PRN
  Administered 2017-11-18 – 2017-11-21 (×3): 500 mg via ORAL
  Filled 2017-11-18 (×3): qty 1

## 2017-11-18 MED ORDER — INSULIN ASPART 100 UNIT/ML ~~LOC~~ SOLN
12.0000 [IU] | Freq: Three times a day (TID) | SUBCUTANEOUS | Status: DC
  Administered 2017-11-19 – 2017-11-21 (×5): 12 [IU] via SUBCUTANEOUS

## 2017-11-18 MED ORDER — SODIUM CHLORIDE 0.9 % IV SOLN
Freq: Once | INTRAVENOUS | Status: AC
  Administered 2017-11-18: 09:00:00 via INTRAVENOUS

## 2017-11-18 MED ORDER — INSULIN ASPART 100 UNIT/ML ~~LOC~~ SOLN
0.0000 [IU] | Freq: Every day | SUBCUTANEOUS | Status: DC
  Administered 2017-11-18: 4 [IU] via SUBCUTANEOUS

## 2017-11-18 MED ORDER — INSULIN GLARGINE 100 UNIT/ML ~~LOC~~ SOLN
15.0000 [IU] | Freq: Every day | SUBCUTANEOUS | Status: DC
  Administered 2017-11-18: 15 [IU] via SUBCUTANEOUS
  Filled 2017-11-18 (×2): qty 0.15

## 2017-11-18 MED ORDER — POTASSIUM CHLORIDE CRYS ER 20 MEQ PO TBCR
40.0000 meq | EXTENDED_RELEASE_TABLET | Freq: Two times a day (BID) | ORAL | Status: AC
  Administered 2017-11-18 (×2): 40 meq via ORAL
  Filled 2017-11-18 (×2): qty 2

## 2017-11-18 NOTE — Evaluation (Signed)
Physical Therapy Evaluation Patient Details Name: Andrea Rivers MRN: [id number] DOB: 1956-02-26 Today's Date: 11/18/2017   History of Present Illness  62 y.o. female admitted on 11/14/17 for L distal femur fx s/p ORIF and is now NWB post op.  Pt with significant PMH of Vertigo (was actively being seen by PT for this), neuropathy, anemia, HTN, A-fib, DM, COPD, CKD, and CHF.  Clinical Impression  Pt required significant time and very slow moving to make her way to EOB.  She will need a significant amount of rehab post her acute hospital stay and will likely not be able to return home until she can bear weight through her left leg and walk.  PT to follow acutely for deficits listed below.       Follow Up Recommendations SNF    Equipment Recommendations  Hospital bed    Recommendations for Other Services   NA    Precautions / Restrictions Precautions Precautions: Fall;Knee Restrictions Weight Bearing Restrictions: Yes LLE Weight Bearing: Non weight bearing      Mobility  Bed Mobility Overal bed mobility: Needs Assistance Bed Mobility: Supine to Sit;Sit to Supine     Supine to sit: +2 for physical assistance;Mod assist;HOB elevated Sit to supine: +2 for physical assistance;Max assist   General bed mobility comments: HOB maximally elevated.  Three person assist used as pt was anxious and painful with mobility.  Two person mod assist to get to EOB with HOB maximally elevated and pt using bed rail heavily for support at trunk.  Max assist needed to help pt control trunk and lift both legs back into bed.    Transfers                 General transfer comment: Unable to attempt.  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    Ambulation/Gait             General Gait Details: unable           Balance Overall balance assessment: Needs assistance Sitting-balance support: Feet  supported;Bilateral upper extremity supported Sitting balance-Leahy Scale: Fair Sitting balance - Comments: close supervision EOB.                                       Pertinent Vitals/Pain Pain Assessment: Faces Faces Pain Scale: Hurts even more Pain Location: left leg Pain Descriptors / Indicators: Grimacing;Guarding Pain Intervention(s): Limited activity within patient's tolerance;Monitored during session;Repositioned    Home Living Family/patient expects to be discharged to:: Private residence([facility]) Living Arrangements: Non-relatives/Friends(ex husband) Available Help at Discharge: Friend(s) Type of Home: Mobile home Home Access: Ramped entrance;Stairs to enter(ramp and then 1 step) Entrance Stairs-Rails: Can reach both(on the ramp) Entrance Stairs-Number of Steps: 1 Home Layout: One level Home Equipment: Walker - 2 wheels;Cane - quad      Prior Function Level of Independence: Independent                  Extremity/Trunk Assessment   Upper Extremity Assessment Upper Extremity Assessment: Overall WFL for tasks assessed    Lower Extremity Assessment Lower Extremity Assessment: LLE deficits/detail LLE Deficits / Details: left leg with normal post op pain and weakness.  Ankle 3-/5, knee 1/5, hip 2/5 grossly per functional assessment.     Cervical /  Trunk Assessment Cervical / Trunk Assessment: Other exceptions Cervical / Trunk Exceptions: "I fractured my back twice"  Communication   Communication: No difficulties  Cognition Arousal/Alertness: Awake/alert Behavior During Therapy: WFL for tasks assessed/performed Overall Cognitive Status: Within Functional Limits for tasks assessed                                        General Comments General comments (skin integrity, edema, etc.): Significant amount of time was spent tending to two pressure ulcers on her mid/low back and in her perianal region.  RN made aware and  brought dressing.  Extra care taken to rotate pt off of her back/bottom and float heels at the end of the session.          Assessment/Plan    PT Assessment Patient needs continued PT services  PT Problem List Decreased strength;Decreased range of motion;Decreased activity tolerance;Decreased balance;Decreased mobility;Decreased knowledge of use of DME;Decreased knowledge of precautions;Obesity;Pain       PT Treatment Interventions DME instruction;Functional mobility training;Therapeutic activities;Therapeutic exercise;Balance training;Patient/family education;Wheelchair mobility training;Manual techniques;Modalities    PT Goals (Current goals can be found in the Care Plan section)  Acute Rehab PT Goals Patient Stated Goal: to get back on her feet when she is allowed. 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         AM-PAC PT "6 Clicks" Daily Activity  Outcome Measure Difficulty turning over in bed (including adjusting bedclothes, sheets and blankets)?: Unable Difficulty moving from lying on back to sitting on the side of the bed? : Unable Difficulty sitting down on and standing up from a chair with arms (e.g., wheelchair, bedside commode, etc,.)?: Unable Help needed moving to and from a bed to chair (including a wheelchair)?: Total Help needed walking in hospital room?: Total Help needed climbing 3-5 steps with a railing? : Total 6 Click Score: 6    End of Session Equipment Utilized During Treatment: Oxygen Activity Tolerance: Patient limited by pain Patient left: in bed;with call bell/phone within reach   PT Visit Diagnosis: Muscle weakness (generalized) (M62.81);Pain;Difficulty in walking, not elsewhere classified (R26.2) Pain -  Right/Left: Left Pain - part of body: Leg    Time: 3329-5188 PT Time Calculation (min) (ACUTE ONLY): 63 min   Charges:         Wells Guiles B. Deshan Hemmelgarn, PT, DPT [phone]   PT Evaluation $PT Eval Moderate Complexity: 1 Mod PT Treatments $Therapeutic Activity: 38-52 mins   11/18/2017, 9:57 PM

## 2017-11-18 NOTE — Progress Notes (Signed)
Orthopaedic Trauma Progress Note  S: Pain decently controlled. Wound vac machine with leak so connected to wall suction  O:  Vitals:   11/18/17 0535 11/18/17 1045  BP: (!) 123/57 100/87  Pulse: 73   Resp: 16   Temp: 98.5 F (36.9 C)   SpO2: 98%    LLE: Swollen, dressings c/d/i, incision vac with good seal. Neurovascularly intact  Imaging: Stable postop images  Labs:  CBC    Component Value Date/Time   WBC 15.1 (H) 11/18/2017 0548   RBC 2.77 (L) 11/18/2017 0548   HGB 7.7 (L) 11/18/2017 0548   HCT 24.6 (L) 11/18/2017 0548   PLT 173 11/18/2017 0548   MCV 88.8 11/18/2017 0548   MCH 27.8 11/18/2017 0548   MCHC 31.3 11/18/2017 0548   RDW 17.4 (H) 11/18/2017 0548   LYMPHSABS 0.8 11/18/2017 0548   MONOABS 1.2 (H) 11/18/2017 0548   EOSABS 0.0 11/18/2017 0548   BASOSABS 0.0 11/18/2017 0546    A/P: 62 year old female s/p ORIF of left distal femur fracture on coumadin for anticoagulation  Weightbearing: NWB LLE Insicional and dressing care: Incisional vac continue until Sunday 4/14 then may remove, okay to discontinue from wall suction for therapy Orthopedic device(s): None Showering: Not yet VTE prophylaxis: Okay to restart anticoaguation tomorrow  Pain control: Norco PRN Follow - up plan: 2 weeks  Shona Needles, MD [facility] [phone] (phone)

## 2017-11-18 NOTE — Progress Notes (Signed)
Inpatient Diabetes Program Recommendations  AACE/ADA: New Consensus Statement on Inpatient Glycemic Control (2015)  Target Ranges:  Prepandial:   less than 140 mg/dL      Peak postprandial:   less than 180 mg/dL (1-2 hours)      Critically ill patients:  140 - 180 mg/dL   Lab Results  Component Value Date   GLUCAP 347 (H) 11/18/2017   HGBA1C 7.8 (H) 11/14/2017    Review of Glycemic Control  After speaking with pt regarding her regimen at home, recommend adding Novolog 12 units tidwc instead of 4 units tidwc.   Thank you. Lorenda Peck, RD, LDN, CDE Inpatient Diabetes Coordinator [phone]

## 2017-11-18 NOTE — Progress Notes (Signed)
PROGRESS NOTE    Andrea Rivers  YTK:[id number] DOB: [birth year] DOA: 11/14/2017 PCP: Sofie Hartigan, MD   Brief Narrative:  Andrea Rivers is a 62 y.o. year old female with medical history significant for PAF on coumadin, diastolic CHF, HTN, CKD, stage III (baseline creatinine: 1.6-1.8), T2DM , OSA not on CPAP who presented to [facility] on 4/8 as a transfer from [facility] ED for a  left distal femur comminuted fracture after mechanical fall.  Per patient she tripped on bedside comforter and landed on her left knee. Prior to this she was completely independent with her ADLs and iADLs.  She is adherent with all her chronic medications; the only change she made was an extra dose of metolazone prior to admission for extra fluid build up she noticed. Admitted and underwent Surgery 4/11/ and is doing well post-operatively but dropped her Hb/Hct so will transfuse 1 unit pRBC.   Assessment & Plan:   Principal Problem:   Fracture Active Problems:   Chronic diastolic heart failure (HCC)   HTN (hypertension)   Diabetes (HCC)   Obstructive sleep apnea   Iron deficiency anemia   CKD (chronic kidney disease)   Hypothyroidism   Atrial fibrillation (HCC)   Anxiety state   Peripheral neuropathy   Closed displaced supracondylar fracture of distal end of left femur with intracondylar extension (HCC)  Left comminuted distal fracture, after mechanical fall s/p ORIF POD1 (yesteday POD0) -Patient tripped after foot got tangled in comforter on bed -X-ray (OSH) Right distal comminuted fracture with anterior displacement, joint effusion -EDP spoke with Dr. Altamese  (orthopedic), seen by orthopedics. 4/9, surgery on hold because of elevated INR, currently getting FFP for INR of  2.2 -Ortho and ORIF done yeseterday   -PT eval after surgery -Pain control:  IV morphine every 4 hours as needed severe pain, oxycodone 5-10 mg every 4 hours as needed moderate breakthrough pain - Bowel regimen: miralax  scheduled, PRN senna - Further Care per Ortho and resume Coumadin when ok with Ortho; Dr. Doreatha Martin recommending restarting tomorrow  Hypokalemia -Likely related to diuretics , currently on hold ( metolazone and torsemide) -K+ now 3.2 -Replete with po Potassium Chloride 40 mEQ BID -Continue to Monitor and Replete as Necessary -Repeat CMP in AM  Leukocytosis -Most likely Stress related to acute fracture -Without infectious signs or symptoms and afebrile -s/p timed IVF and daily CBC, UA & CXR neg, blood cx NGTD -WBC went from 14.3 -> 14.7 -> 15.1 -Continue to Monitor   Atrial fibrillation, rate controlled -INR trended down and now 1.30 -Held Coumadin therapy in setting of upcoming surgery -Continue home amiodarone and metoprolol -Resume Coumadin after  surgery if okay with orthopedics Due to itching , hypoxia FFP held, will need to reverse INR with vitamin K and recheck INR in am  -INR now 5.57  Diastolic CHF, euvolemic on exam -minimal edema of right ankle on exam -Resume diuretics post surgery in AM -Daily weights, I/Os, closely monitor volume status  CKD, stage 3, stable - creatinine stable at baseline and BUN/Cr now 44/1.53 -> 50/1.79 - Continue to Monitor and repeat CMP in AM   Chronic IDA, stable and Acute Blood Loss Anemia -Hb/Hct now dropped to 9.5/31.0 -> 7.7/24.6 -No longer on oral iron -Gets infusion therapy as outpatient -Type and Screen and Transfuse 1 unit of pRBC -Monitor daily on cbc  T2DM, A1c 7.8%(11/2017) -Fairly large doses of humalog at home -Adjusted Insulin doses; Started Lantus 15 units, Novolog 12 units TID  wm, and Moderate Novolog SSI AC/HS -Consulted Diabetes Coordinator  -CBG's ranging from 227-347  Hypothyroidism,stable -TSH 2.7(09/2017) -C/w Levothyroxine 175 mcg po Daily   Vertigo, chronic, stable -C/w Meclizine 12.5 mg po TIDprn Dizziness  GERD, chronic, stable -C/w Pantoprazole 40 mg po BID   Hyperlipidemia -C/w Home  Pravastatin 20 mg po daily   DVT prophylaxis: Resume Coumadin when ok with Ortho likley in AM  Code Status: FULL CODE Family Communication: Sister at bedside  Disposition Plan: Likely SNF  Consultants:   Orthopedic Surgery    Procedures: ORIF   Antimicrobials:  Anti-infectives (From admission, onward)   Start     Dose/Rate Route Frequency Ordered Stop   11/17/17 1600  ceFAZolin (ANCEF) IVPB 2g/100 mL premix     2 g 200 mL/hr over 30 Minutes Intravenous Every 8 hours 11/17/17 1345 11/18/17 0850   11/17/17 1100  tobramycin (NEBCIN) powder  Status:  Discontinued       As needed 11/17/17 1122 11/17/17 1207   11/17/17 0902  vancomycin (VANCOCIN) powder  Status:  Discontinued       As needed 11/17/17 0903 11/17/17 1207   11/16/17 1500  ceFAZolin (ANCEF) 3 g in dextrose 5 % 50 mL IVPB  Status:  Discontinued     3 g 130 mL/hr over 30 Minutes Intravenous To [facility]Stay [facility] 11/15/17 1317 11/17/17 1332   11/16/17 1400  ceFAZolin (ANCEF) 3 g in dextrose 5 % 50 mL IVPB  Status:  Discontinued     3 g 130 mL/hr over 30 Minutes Intravenous On call to O.R. 11/15/17 1848 11/15/17 1853     Subjective: Seen and examined at bedside and had some pain in leg. Main complaint was being hot last night. No CP or SOB.   Objective: Vitals:   11/18/17 1356 11/18/17 1517 11/18/17 1543 11/18/17 1908  BP: (!) 129/54 (!) 118/54 105/82 119/65  Pulse: 84 68 68 71  Resp: 14 16 14 16   Temp: 98.5 F (36.9 C) 98.2 F (36.8 C) 98.3 F (36.8 C) 98.4 F (36.9 C)  TempSrc: Oral Oral Oral Oral  SpO2: 97% 96% 96% 97%  Weight:      Height:        Intake/Output Summary (Last 24 hours) at 11/18/2017 2133 Last data filed at 11/18/2017 1908 Gross per 24 hour  Intake 971.25 ml  Output 800 ml  Net 171.25 ml   Filed Weights   11/18/17 1300  Weight: (!) 138.3 kg (304 lb 14.3 oz)   Examination: Physical Exam:  Constitutional: WN/WD morbidly obese Caucasian female in NAD appears calm  Eyes: Lids and  conjunctivae normal; Sclerae anicteric ENMT: External Ears and nose appear normal Neck: Supple with no JVD Respiratory: Diminished to auscultation; No appreciable wheezing, rales, rhonchi Cardiovascular: Irregularly irregular. No appreciable m/r/g; Trace LE edema Abdomen: Soft, NT, Distended due to body habitus. Bowel sounds present GU: Deferred Musculoskeletal: No contractures; Left Leg wrapped with Wound Vac Skin: Left Leg wrapped with wound vac. No appreciable rashes or lesions on a limited skin eval Neurologic: CN 2-12 grossly intact. Psychiatric: Normal mood and insight. Alert and oriented x3  Data Reviewed: I have personally reviewed following labs and imaging studies  CBC: Recent Labs  Lab 11/14/17 1839 11/15/17 0402 11/16/17 0552 11/17/17 0647 11/18/17 0548  WBC 17.8* 15.7* 14.3* 14.7* 15.1*  NEUTROABS  --   --   --   --  13.1*  HGB 11.8* 10.9* 9.7* 9.5* 7.7*  HCT 38.1 35.2* 32.9* 31.0* 24.6*  MCV  86.2 86.3 89.2 89.1 88.8  PLT 229 218 178 178 193   Basic Metabolic Panel: Recent Labs  Lab 11/14/17 1030  11/14/17 1839 11/15/17 0402 11/16/17 0552 11/17/17 0647 11/18/17 0548  NA  --    < > 139 139 140 139 134*  K  --    < > 2.7* 3.3* 3.9 3.8 3.2*  CL  --    < > 87* 91* 92* 91* 89*  CO2  --    < > 31 33* 35* 36* 31  GLUCOSE  --    < > 265* 219* 194* 198* 231*  BUN  --    < > 51* 51* 43* 44* 50*  CREATININE  --    < > 1.81* 1.72* 1.59* 1.53* 1.79*  CALCIUM  --    < > 9.2 8.6* 8.3* 8.2* 7.8*  MG 2.0  --   --   --   --   --  1.8  PHOS  --   --   --   --   --   --  2.9   < > = values in this interval not displayed.   GFR: Estimated Creatinine Clearance: 46 mL/min (A) (by C-G formula based on SCr of 1.79 mg/dL (H)). Liver Function Tests: Recent Labs  Lab 11/14/17 1158 11/18/17 0548  AST 30 25  ALT 20 13*  ALKPHOS 77 58  BILITOT 0.7 0.8  PROT 8.3* 6.4*  ALBUMIN 3.8 2.9*   No results for input(s): LIPASE, AMYLASE in the last 168 hours. No results for  input(s): AMMONIA in the last 168 hours. Coagulation Profile: Recent Labs  Lab 11/16/17 0552 11/16/17 1119 11/16/17 1920 11/17/17 0647 11/18/17 0548  INR 2.20 2.08 1.78 1.47 1.30   Cardiac Enzymes: No results for input(s): CKTOTAL, CKMB, CKMBINDEX, TROPONINI in the last 168 hours. BNP (last 3 results) No results for input(s): PROBNP in the last 8760 hours. HbA1C: No results for input(s): HGBA1C in the last 72 hours. CBG: Recent Labs  Lab 11/17/17 2156 11/18/17 0742 11/18/17 1234 11/18/17 1647 11/18/17 2126  GLUCAP 221* 227* 347* 239* 321*   Lipid Profile: No results for input(s): CHOL, HDL, LDLCALC, TRIG, CHOLHDL, LDLDIRECT in the last 72 hours. Thyroid Function Tests: No results for input(s): TSH, T4TOTAL, FREET4, T3FREE, THYROIDAB in the last 72 hours. Anemia Panel: No results for input(s): VITAMINB12, FOLATE, FERRITIN, TIBC, IRON, RETICCTPCT in the last 72 hours. Sepsis Labs: No results for input(s): PROCALCITON, LATICACIDVEN in the last 168 hours.  Recent Results (from the past 240 hour(s))  Culture, blood (Routine X 2) w Reflex to ID Panel     Status: None (Preliminary result)   Collection Time: 11/14/17  6:53 PM  Result Value Ref Range Status   Specimen Description BLOOD LEFT ANTECUBITAL  Final   Special Requests   Final    BOTTLES DRAWN AEROBIC AND ANAEROBIC Blood Culture adequate volume   Culture   Final    NO GROWTH 4 DAYS Performed at [facility], [address] [place], [place] [address]    Report Status PENDING  Incomplete  Culture, blood (Routine X 2) w Reflex to ID Panel     Status: None (Preliminary result)   Collection Time: 11/14/17  6:57 PM  Result Value Ref Range Status   Specimen Description BLOOD RIGHT WRIST  Final   Special Requests   Final    BOTTLES DRAWN AEROBIC AND ANAEROBIC Blood Culture adequate volume   Culture   Final  NO GROWTH 4 DAYS Performed at [facility], [place] [address] [place], [place] [address]    Report  Status PENDING  Incomplete  Surgical pcr screen     Status: None   Collection Time: 11/15/17  5:47 AM  Result Value Ref Range Status   MRSA, PCR NEGATIVE NEGATIVE Final   Staphylococcus aureus NEGATIVE NEGATIVE Final    Comment: (NOTE) The Xpert SA Assay (FDA approved for NASAL specimens in patients 77 years of age and older), is one component of a comprehensive surveillance program. It is not intended to diagnose infection nor to guide or monitor treatment. Performed at [facility], [place] [address] [place], [place] [address]     Radiology Studies: Dg C-arm 1-60 Min  Result Date: 11/17/2017 CLINICAL DATA:  ORIF of distal femoral fracture EXAM: LEFT FEMUR 2 VIEWS; DG C-ARM 61-120 MIN COMPARISON:  11/15/2017 FLUOROSCOPY TIME:  Fluoroscopy Time:  3 minutes 50 seconds Radiation Exposure Index (if provided by the fluoroscopic device): Not available Number of Acquired Spot Images: 8 FINDINGS: The previously seen distal femoral fracture is again identified on the initial images. Fixation screws are noted traversing the distal most aspect of the femur. Subsequent fixation sideplate is noted. Multiple fixation screws are seen. The fracture fragments are in near anatomic alignment. IMPRESSION: ORIF of distal femoral fracture Electronically Signed   By: Inez Catalina M.D.   On: 11/17/2017 11:32   Dg C-arm 1-60 Min  Result Date: 11/17/2017 CLINICAL DATA:  ORIF of distal femoral fracture EXAM: LEFT FEMUR 2 VIEWS; DG C-ARM 61-120 MIN COMPARISON:  11/15/2017 FLUOROSCOPY TIME:  Fluoroscopy Time:  3 minutes 50 seconds Radiation Exposure Index (if provided by the fluoroscopic device): Not available Number of Acquired Spot Images: 8 FINDINGS: The previously seen distal femoral fracture is again identified on the initial images. Fixation screws are noted traversing the distal most aspect of the femur. Subsequent fixation sideplate is noted. Multiple fixation screws are seen. The fracture fragments are in  near anatomic alignment. IMPRESSION: ORIF of distal femoral fracture Electronically Signed   By: Inez Catalina M.D.   On: 11/17/2017 11:32   Dg C-arm 1-60 Min  Result Date: 11/17/2017 CLINICAL DATA:  ORIF of distal femoral fracture EXAM: LEFT FEMUR 2 VIEWS; DG C-ARM 61-120 MIN COMPARISON:  11/15/2017 FLUOROSCOPY TIME:  Fluoroscopy Time:  3 minutes 50 seconds Radiation Exposure Index (if provided by the fluoroscopic device): Not available Number of Acquired Spot Images: 8 FINDINGS: The previously seen distal femoral fracture is again identified on the initial images. Fixation screws are noted traversing the distal most aspect of the femur. Subsequent fixation sideplate is noted. Multiple fixation screws are seen. The fracture fragments are in near anatomic alignment. IMPRESSION: ORIF of distal femoral fracture Electronically Signed   By: Inez Catalina M.D.   On: 11/17/2017 11:32   Dg Femur Min 2 Views Left  Result Date: 11/17/2017 CLINICAL DATA:  ORIF of distal femoral fracture EXAM: LEFT FEMUR 2 VIEWS; DG C-ARM 61-120 MIN COMPARISON:  11/15/2017 FLUOROSCOPY TIME:  Fluoroscopy Time:  3 minutes 50 seconds Radiation Exposure Index (if provided by the fluoroscopic device): Not available Number of Acquired Spot Images: 8 FINDINGS: The previously seen distal femoral fracture is again identified on the initial images. Fixation screws are noted traversing the distal most aspect of the femur. Subsequent fixation sideplate is noted. Multiple fixation screws are seen. The fracture fragments are in near anatomic alignment. IMPRESSION: ORIF of distal femoral fracture Electronically Signed  By: Inez Catalina M.D.   On: 11/17/2017 11:32   Dg Femur Port Min 2 Views Left  Result Date: 11/17/2017 CLINICAL DATA:  Status post ORIF of distal femoral fracture EXAM: LEFT FEMUR PORTABLE 2 VIEWS COMPARISON:  None. FINDINGS: Fixation sideplate and multiple fixation screws are noted. Fracture fragments are in near anatomic  alignment. No other focal abnormality is noted. IMPRESSION: Status post ORIF of distal femoral fracture Electronically Signed   By: Inez Catalina M.D.   On: 11/17/2017 13:35   Scheduled Meds: . amiodarone  200 mg Oral Daily  . citalopram  20 mg Oral Daily  . cloNIDine  0.1 mg Oral BID  . fluticasone  1 spray Each Nare Daily  . gabapentin  100 mg Oral Daily  . insulin aspart  0-15 Units Subcutaneous TID WC  . insulin aspart  0-5 Units Subcutaneous QHS  . [START ON 11/19/2017] insulin aspart  12 Units Subcutaneous TID WC  . insulin glargine  15 Units Subcutaneous QHS  . levothyroxine  175 mcg Oral QAC breakfast  . loratadine  10 mg Oral Daily  . pantoprazole  40 mg Oral BID  . polyethylene glycol  17 g Oral Daily  . potassium chloride  40 mEq Oral BID  . pravastatin  20 mg Oral q1800   Continuous Infusions:   LOS: 4 days   Kerney Elbe, DO [facility] Pager [phone]  If 7PM-7AM, please contact night-coverage [email or web address] Password [facility] 11/18/2017, 9:33 PM

## 2017-11-18 NOTE — Progress Notes (Signed)
Inpatient Diabetes Program Recommendations  AACE/ADA: New Consensus Statement on Inpatient Glycemic Control (2015)  Target Ranges:  Prepandial:   less than 140 mg/dL      Peak postprandial:   less than 180 mg/dL (1-2 hours)      Critically ill patients:  140 - 180 mg/dL   Lab Results  Component Value Date   GLUCAP 227 (H) 11/18/2017   HGBA1C 7.8 (H) 11/14/2017    Review of Glycemic Control  Diabetes history:Type 2 DM Outpatient Diabetes medications:Amaryl 2 mg with breakfast, Humalog 30-40 units tid, Tradjenta 5 mg daily Current orders for Inpatient glycemic control: Novolog moderate tid with meals  Inpatient Diabetes Program Recommendations:    Please consider adding Lantus 15 units daily. Change Novolog to 0-15 units tidwc and hs Add Novolog 4 units tidwc for meal coverage insulin. Titrate if CBGs > 180 mg/dL. Change diet to CHO mod med.  Continue to follow.  Thank you. Lorenda Peck, RD, LDN, CDE Inpatient Diabetes Coordinator [phone]

## 2017-11-19 LAB — HEMOGLOBIN AND HEMATOCRIT, BLOOD
HCT: 25.9 % — ABNORMAL LOW (ref 36.0–46.0)
Hemoglobin: 8.2 g/dL — ABNORMAL LOW (ref 12.0–15.0)

## 2017-11-19 LAB — GLUCOSE, CAPILLARY
GLUCOSE-CAPILLARY: 237 mg/dL — AB (ref 65–99)
Glucose-Capillary: 265 mg/dL — ABNORMAL HIGH (ref 65–99)
Glucose-Capillary: 226 mg/dL — ABNORMAL HIGH (ref 65–99)
Glucose-Capillary: 163 mg/dL — ABNORMAL HIGH (ref 65–99)

## 2017-11-19 LAB — CBC WITH DIFFERENTIAL/PLATELET
BASOS PCT: 0 %
Basophils Absolute: 0 10*3/uL (ref 0.0–0.1)
EOS PCT: 1 %
Eosinophils Absolute: 0.1 10*3/uL (ref 0.0–0.7)
HEMATOCRIT: 25.6 % — AB (ref 36.0–46.0)
Hemoglobin: 8.2 g/dL — ABNORMAL LOW (ref 12.0–15.0)
Lymphocytes Relative: 7 %
Lymphs Abs: 1 10*3/uL (ref 0.7–4.0)
MCH: 28.1 pg (ref 26.0–34.0)
MCHC: 32 g/dL (ref 30.0–36.0)
MCV: 87.7 fL (ref 78.0–100.0)
MONO ABS: 1.5 10*3/uL — AB (ref 0.1–1.0)
MONOS PCT: 10 %
NEUTROS ABS: 12.3 10*3/uL — AB (ref 1.7–7.7)
Neutrophils Relative %: 82 %
PLATELETS: 186 10*3/uL (ref 150–400)
RBC: 2.92 MIL/uL — ABNORMAL LOW (ref 3.87–5.11)
RDW: 16.7 % — AB (ref 11.5–15.5)
WBC: 15 10*3/uL — ABNORMAL HIGH (ref 4.0–10.5)

## 2017-11-19 LAB — CULTURE, BLOOD (ROUTINE X 2)
CULTURE: NO GROWTH
CULTURE: NO GROWTH
Special Requests: ADEQUATE
Special Requests: ADEQUATE

## 2017-11-19 LAB — COMPREHENSIVE METABOLIC PANEL
ALBUMIN: 2.5 g/dL — AB (ref 3.5–5.0)
ALT: 6 U/L — ABNORMAL LOW (ref 14–54)
ANION GAP: 10 (ref 5–15)
AST: 22 U/L (ref 15–41)
Alkaline Phosphatase: 79 U/L (ref 38–126)
BILIRUBIN TOTAL: 0.9 mg/dL (ref 0.3–1.2)
BUN: 52 mg/dL — ABNORMAL HIGH (ref 6–20)
CHLORIDE: 90 mmol/L — AB (ref 101–111)
CO2: 33 mmol/L — ABNORMAL HIGH (ref 22–32)
Calcium: 8 mg/dL — ABNORMAL LOW (ref 8.9–10.3)
Creatinine, Ser: 1.71 mg/dL — ABNORMAL HIGH (ref 0.44–1.00)
GFR calc Af Amer: 36 mL/min — ABNORMAL LOW (ref >60)
GFR, EST NON AFRICAN AMERICAN: 31 mL/min — AB (ref >60)
GLUCOSE: 286 mg/dL — AB (ref 65–99)
POTASSIUM: 3.9 mmol/L (ref 3.5–5.1)
Sodium: 133 mmol/L — ABNORMAL LOW (ref 135–145)
TOTAL PROTEIN: 6.4 g/dL — AB (ref 6.5–8.1)

## 2017-11-19 LAB — PROTIME-INR
INR: 1.19
PROTHROMBIN TIME: 15 s (ref 11.4–15.2)

## 2017-11-19 LAB — PHOSPHORUS: Phosphorus: 2.3 mg/dL — ABNORMAL LOW (ref 2.5–4.6)

## 2017-11-19 LAB — MAGNESIUM: MAGNESIUM: 2.1 mg/dL (ref 1.7–2.4)

## 2017-11-19 MED ORDER — WARFARIN SODIUM 7.5 MG PO TABS
7.5000 mg | ORAL_TABLET | Freq: Once | ORAL | Status: AC
  Administered 2017-11-19: 7.5 mg via ORAL
  Filled 2017-11-19: qty 1

## 2017-11-19 MED ORDER — WARFARIN - PHARMACIST DOSING INPATIENT: Freq: Every day | Status: DC

## 2017-11-19 MED ORDER — SODIUM CHLORIDE 0.9 % IV SOLN
INTRAVENOUS | Status: AC
  Administered 2017-11-20: via INTRAVENOUS

## 2017-11-19 MED ORDER — INSULIN GLARGINE 100 UNIT/ML ~~LOC~~ SOLN
20.0000 [IU] | Freq: Every day | SUBCUTANEOUS | Status: DC
  Administered 2017-11-20 (×2): 20 [IU] via SUBCUTANEOUS
  Filled 2017-11-19 (×2): qty 0.2

## 2017-11-19 MED ORDER — SODIUM CHLORIDE 0.9 % IV SOLN
Freq: Once | INTRAVENOUS | Status: AC
  Administered 2017-11-19: 16:00:00 via INTRAVENOUS

## 2017-11-19 NOTE — NC FL2 (Signed)
[place] MEDICAID FL2 LEVEL OF CARE SCREENING TOOL     IDENTIFICATION  Patient Name: Andrea Rivers Birthdate: [birth year] Sex: female Admission Date (Current Location): 11/14/2017  [facility] and [place] Number:  Engineering geologist and Address:  [facility] [place], [place] [address] [place], [place] [address]      Provider Number: [id number]  Attending Physician Name and Address:  Kerney Elbe, DO  Relative Name and Phone Number:  Shanon Brow, spouse, [phone]    Current Level of Care: Hospital Recommended Level of Care: [place] Prior Approval Number:    Date Approved/Denied:   PASRR Number: [id number]  Discharge Plan: SNF    Current Diagnoses: Patient Active Problem List   Diagnosis Date Noted  . Closed displaced supracondylar fracture of distal end of left femur with intracondylar extension ([place]) 11/17/2017  . CKD (chronic kidney disease) 11/14/2017  . Hypothyroidism 11/14/2017  . Atrial fibrillation ([place]) 11/14/2017  . Anxiety state 11/14/2017  . Peripheral neuropathy 11/14/2017  . Fracture 11/14/2017  . Bradycardia 08/13/2017  . Microcytic anemia 03/31/2017  . Iron deficiency anemia 03/31/2017  . Chronic diastolic heart failure ([place]) 03/10/2017  . HTN (hypertension) 03/10/2017  . Diabetes ([place]) 03/10/2017  . Obstructive sleep apnea 03/10/2017  . Shock ([place])     Orientation RESPIRATION BLADDER Height & Weight     Self, Time, Situation, Place  Normal Continent, External catheter Weight: (!) 138.3 kg (304 lb 14.3 oz) Height:  5\' 5"  (165.1 cm)  BEHAVIORAL SYMPTOMS/MOOD NEUROLOGICAL BOWEL NUTRITION STATUS      Continent Diet(Please see DC Summary)  AMBULATORY STATUS COMMUNICATION OF NEEDS Skin   Extensive Assist Verbally Surgical wounds(Closed incision on leg)                       Personal Care Assistance Level of Assistance  Bathing, Feeding, Dressing Bathing Assistance: Maximum assistance Feeding  assistance: Independent Dressing Assistance: Limited assistance     Functional Limitations Info  Sight Sight Info: Impaired(Wears glasses)        SPECIAL CARE FACTORS FREQUENCY  PT (By licensed PT), OT (By licensed OT)     PT Frequency: 5x/week OT Frequency: 3x/week            Contractures      Additional Factors Info  Code Status, Allergies, Insulin Sliding Scale Code Status Info: Full Allergies Info: Other, Latex, Exenatide, Garlic, Onion, Rosiglitazone, Erythromycin   Insulin Sliding Scale Info: 3x daily with meals and at bedtime       Current Medications (11/19/2017):  This is the current hospital active medication list Current Facility-Administered Medications  Medication Dose Route Frequency Provider Last Rate Last Dose  . albuterol (PROVENTIL) (2.5 MG/3ML) 0.083% nebulizer solution 2.5 mg  2.5 mg Inhalation Q4H PRN Haddix, Thomasene Lot, MD   2.5 mg at 11/16/17 1038  . amiodarone (PACERONE) tablet 200 mg  200 mg Oral Daily Raiford Noble Latif, DO   200 mg at 11/18/17 1046  . bisacodyl (DULCOLAX) EC tablet 5 mg  5 mg Oral Daily PRN Haddix, Thomasene Lot, MD      . citalopram (CELEXA) tablet 20 mg  20 mg Oral Daily Haddix, Thomasene Lot, MD   20 mg at 11/18/17 1047  . cloNIDine (CATAPRES) tablet 0.1 mg  0.1 mg Oral BID Haddix, Thomasene Lot, MD   0.1 mg at 11/17/17 2231  . diphenhydrAMINE (BENADRYL) injection 25 mg  25 mg Intravenous Q4H PRN Haddix, Thomasene Lot, MD  25 mg at 11/18/17 1444  . fluticasone (FLONASE) 50 MCG/ACT nasal spray 1 spray  1 spray Each Nare Daily Haddix, Thomasene Lot, MD   1 spray at 11/18/17 1048  . gabapentin (NEURONTIN) capsule 100 mg  100 mg Oral Daily Haddix, Thomasene Lot, MD   100 mg at 11/18/17 1045  . HYDROcodone-acetaminophen (NORCO/VICODIN) 5-325 MG per tablet 1-2 tablet  1-2 tablet Oral Q6H PRN Haddix, Thomasene Lot, MD   2 tablet at 11/18/17 1548  . insulin aspart (novoLOG) injection 0-15 Units  0-15 Units Subcutaneous TID WC Haddix, Thomasene Lot, MD   5 Units at 11/18/17 1824  .  insulin aspart (novoLOG) injection 0-5 Units  0-5 Units Subcutaneous QHS Raiford Noble [place], [place]   4 Units at 11/18/17 2158  . insulin aspart (novoLOG) injection 12 Units  12 Units Subcutaneous TID WC Sheikh, Omair Latif, DO      . insulin glargine (LANTUS) injection 15 Units  15 Units Subcutaneous QHS Raiford Noble [place], [place]   15 Units at 11/18/17 2029  . levothyroxine (SYNTHROID, LEVOTHROID) tablet 175 mcg  175 mcg Oral QAC breakfast Haddix, Thomasene Lot, MD   175 mcg at 11/18/17 0819  . loratadine (CLARITIN) tablet 10 mg  10 mg Oral Daily Haddix, Thomasene Lot, MD   10 mg at 11/18/17 1046  . meclizine (ANTIVERT) tablet 12.5 mg  12.5 mg Oral TID PRN Haddix, Thomasene Lot, MD   12.5 mg at 11/18/17 1046  . methocarbamol (ROBAXIN) tablet 500 mg  500 mg Oral Q6H PRN Opyd, Ilene Qua, MD   500 mg at 11/18/17 1548  . morphine 4 MG/ML injection 2-4 mg  2-4 mg Intravenous Q4H PRN Haddix, Thomasene Lot, MD   2 mg at 11/17/17 1517  . oxyCODONE (Oxy IR/ROXICODONE) immediate release tablet 5-10 mg  5-10 mg Oral Q4H PRN Haddix, Thomasene Lot, MD   10 mg at 11/18/17 1955  . pantoprazole (PROTONIX) EC tablet 40 mg  40 mg Oral BID Haddix, Thomasene Lot, MD   40 mg at 11/18/17 2157  . polyethylene glycol (MIRALAX / GLYCOLAX) packet 17 g  17 g Oral Daily Haddix, Thomasene Lot, MD   17 g at 11/18/17 1046  . pravastatin (PRAVACHOL) tablet 20 mg  20 mg Oral q1800 Haddix, Thomasene Lot, MD   20 mg at 11/18/17 1824  . senna-docusate (Senokot-S) tablet 1 tablet  1 tablet Oral QHS PRN Haddix, Thomasene Lot, MD   1 tablet at 11/18/17 0148  . sodium phosphate (FLEET) 7-19 GM/118ML enema 1 enema  1 enema Rectal Once PRN Haddix, Thomasene Lot, MD         Discharge Medications: Please see discharge summary for a list of discharge medications.  Relevant Imaging Results:  Relevant Lab Results:   Additional Information SSN [id number]  Benard Halsted, LCSWA

## 2017-11-19 NOTE — Progress Notes (Signed)
ANTICOAGULATION CONSULT NOTE - Initial Consult  Pharmacy Consult for Warfarin Indication: atrial fibrillation  Allergies  Allergen Reactions  . Other Other (See Comments)    ILOSONE- Caused GI distress also  . Latex Hives  . Exenatide Nausea Only and Other (See Comments)    [place]- Nausea and abdominal pain, also  . Garlic Other (See Comments)    Severe acid reflux  . Onion Other (See Comments)    Severe acid reflux  . Rosiglitazone Other (See Comments)    [place]and[place]- Affected heart  . Erythromycin Nausea And Vomiting    GI DISTRESS    Patient Measurements: Height: 5\' 5"  (165.1 cm) Weight: (!) 304 lb 14.3 oz (138.3 kg) IBW/kg (Calculated) : 57  Vital Signs: Temp: 98.6 F (37 C) (04/13 0535) Temp Source: Oral (04/13 0535) BP: 143/59 (04/13 0535) Pulse Rate: 77 (04/13 0535)  Labs: Recent Labs    11/17/17 0647 11/18/17 0548 11/19/17 0358 11/19/17 1305  HGB 9.5* 7.7* 8.2* 8.2*  HCT 31.0* 24.6* 25.6* 25.9*  PLT 178 173 186  --   LABPROT 17.7* 16.0* 15.0  --   INR 1.47 1.30 1.19  --   CREATININE 1.53* 1.79* 1.71*  --     Estimated Creatinine Clearance: 48.2 mL/min (A) (by C-G formula based on SCr of 1.71 mg/dL (H)).   Medical History: Past Medical History:  Diagnosis Date  . Anemia   . Anxiety   . CHF (congestive heart failure) ([place])   . Chronic kidney disease    INSUFFICIENCY  . Complication of anesthesia    had to be intubated during cataract surgery   " I could not breath "  . COPD (chronic obstructive pulmonary disease) ([place])   . Diabetes mellitus without complication ([place])   . Dyspnea    DOE  . Dysrhythmia    A FIB  . Edema   . Fracture of distal femur ([place]) 11/2017   left   . GERD (gastroesophageal reflux disease)   . History of kidney stones   . Hypertension   . Hypothyroidism    ABLATION  . Iron deficiency anemia 03/31/2017  . Neuropathy   . Orthopnea   . Sleep apnea    CPAP  . Vertigo   . Wheezing     Assessment: 62 year old female  on warfarin prior to admission with atrial fibrillation admitted 11/14/17 status post mechanical fall requiring ORIF for L-distal femur fracture.   INR on admission was 2.20 on regimen of Warfarin 6 mg po daily.  Warfarin was held for surgery to be resumed today, 4/13. Patient received Vitamin K 5mg  and FFP on 4/10.   INR today 1.19. Hgb up to 8.2 s/p 1 unit of PRBCs and giving another unit today. No bleeding reported.   Goal of Therapy:  INR 2-3  Monitor platelets by anticoagulation protocol: Yes   Plan:  Warfarin 7.5mg  po x1 tonight.  Daily PT/INR.   Sloan Leiter, PharmD, BCPS, BCCCP Clinical Pharmacist Clinical phone 11/19/2017 until 3:[phone] After hours, please call [phone] 11/19/2017,3:12 PM

## 2017-11-19 NOTE — Progress Notes (Signed)
PROGRESS NOTE    Andrea Rivers  FUX:[id number] DOB: 16-Sep-1955 DOA: 11/14/2017 PCP: Sofie Hartigan, MD   Brief Narrative:  Andrea Rivers is a 62 y.o. year old female with medical history significant for PAF on coumadin, diastolic CHF, HTN, CKD, stage III (baseline creatinine: 1.6-1.8), T2DM , OSA not on CPAP who presented to [facility] on 4/8 as a transfer from [facility] ED for a  left distal femur comminuted fracture after mechanical fall.  Per patient she tripped on bedside comforter and landed on her left knee. Prior to this she was completely independent with her ADLs and iADLs.  She is adherent with all her chronic medications; the only change she made was an extra dose of metolazone prior to admission for extra fluid build up she noticed. Admitted and underwent Surgery 4/11/ and is doing well post-operatively but dropped her Hb/Hct so will transfuse 1 unit pRBC. Hb/Hct did not respond appropriately so will transfuse 1 more unit today. Ok to resume Anticoagulation with Coumadin given no bleeding noted.   Assessment & Plan:   Principal Problem:   Fracture Active Problems:   Chronic diastolic heart failure (HCC)   HTN (hypertension)   Diabetes (HCC)   Obstructive sleep apnea   Iron deficiency anemia   CKD (chronic kidney disease)   Hypothyroidism   Atrial fibrillation (HCC)   Anxiety state   Peripheral neuropathy   Closed displaced supracondylar fracture of distal end of left femur with intracondylar extension (HCC)  Left comminuted distal fracture, after mechanical fall s/p ORIF POD2 -Patient tripped after foot got tangled in comforter on bed -X-ray (OSH) Right distal comminuted fracture with anterior displacement, joint effusion -EDP spoke with Dr. Altamese Candelaria (orthopedic), seen by orthopedics. 4/9,  -Surgery was on hold because of elevated INR but brought down with Vitamin K and attempted FFP (due to itching) -Ortho and ORIF done 11/17/17  -PT eval after surgery -Pain  control:  IV morphine every 4 hours as needed severe pain, oxycodone 5-10 mg every 4 hours as needed moderate breakthrough pain - Bowel regimen: miralax scheduled, PRN senna - Further Care per Ortho and resume Coumadin when ok with Ortho; Dr. Doreatha Martin recommended restarting Coumadin today  -Ortho Recommending Advancing Diet, Up with Therapy, D/C'ing Wound Vac in AM and NWB LLE for ROM  Hypokalemia -Likely related to Diuretics , currently on hold ( metolazone and torsemide) -K+ was 3.2 and improved to 3.9 -Replete with po Potassium Chloride 40 mEQ BID yesterday -Continue to Monitor and Replete as Necessary -Repeat CMP in AM  Leukocytosis -Most likely Stress related to acute fracture -Without infectious signs or symptoms and afebrile -Urinalysis showed Trace Leukocytes, Negative Nitrites, 6-30 WBC -CXR showed No Active Disease   -Blood Cx NGTD at 5 Days -WBC went from 14.3 -> 14.7 -> 15.1 -> 15.0 -Continue to Monitor and repeat CBC in AM   Atrial fibrillation, rate controlled -INR trended down and now 1.19 -Held Coumadin therapy in setting of upcoming surgery -Continue home Amiodarone and Metoprolol -INR was reversed Due to itching , hypoxia FFP held, will need to reverse INR with vitamin K   -Resumed Coumadin today as it was ok with Ortho and no Active Bleed  Diastolic CHF, euvolemic on exam -minimal edema of right ankle on exam -Resume diuretics post surgery in AM -Daily weights, I/Os, closely monitor volume status -Patient is -1.674 Liters and Weight is 138.3 Kg  CKD, stage 3, stable - creatinine stable at baseline and BUN/Cr now 44/1.53 ->  50/1.79 -> 52/1.71 - Continue to Monitor and repeat CMP in AM   Chronic IDA, stable and Acute Blood Loss Anemia Post-Operatively -Hb/Hct now dropped to 9.5/31.0 -> 7.7/24.6 and Repeat after 1 unit was 8.2/25.6 -No longer on oral iron -Gets infusion therapy as Outpatient -Type and Screenedd and Transfused 1 unit of pRBC but will give  another one today  -Continue to Monitor with Daily CBC  T2DM, A1c 7.8%(11/2017) -Fairly large doses of Humalog at home -Adjusted Insulin doses; Started Lantus 15 units but will increase to 20 units, Novolog 12 units TID wm, and Moderate Novolog SSI AC/HS -Consulted Diabetes Coordinator  -CBG's ranging from 226-265  Hypothyroidism,stable -TSH 2.7(09/2017) -C/w Levothyroxine 175 mcg po Daily   Vertigo, chronic, stable -C/w Meclizine 12.5 mg po TIDprn Dizziness  GERD, chronic, stable -C/w Pantoprazole 40 mg po BID   Hyperlipidemia -C/w Home Pravastatin 20 mg po daily   Hyponatremia/Hypochloremia -Na+ was 133 and Chloride was 90 -Continue to Monitor and will give Gentle IVF rehydration 50 mL/hr x12 hours -Repeat CMP in AM  DVT prophylaxis: Resumed Coumadin today Code Status: FULL CODE Family Communication: No family present at bedside  Disposition Plan: SNF at D/C  Consultants:   Orthopedic Surgery    Procedures: ORIF   Antimicrobials:  Anti-infectives (From admission, onward)   Start     Dose/Rate Route Frequency Ordered Stop   11/17/17 1600  ceFAZolin (ANCEF) IVPB 2g/100 mL premix     2 g 200 mL/hr over 30 Minutes Intravenous Every 8 hours 11/17/17 1345 11/18/17 0850   11/17/17 1100  tobramycin (NEBCIN) powder  Status:  Discontinued       As needed 11/17/17 1122 11/17/17 1207   11/17/17 0902  vancomycin (VANCOCIN) powder  Status:  Discontinued       As needed 11/17/17 0903 11/17/17 1207   11/16/17 1500  ceFAZolin (ANCEF) 3 g in dextrose 5 % 50 mL IVPB  Status:  Discontinued     3 g 130 mL/hr over 30 Minutes Intravenous To [facility] 11/15/17 1317 11/17/17 1332   11/16/17 1400  ceFAZolin (ANCEF) 3 g in dextrose 5 % 50 mL IVPB  Status:  Discontinued     3 g 130 mL/hr over 30 Minutes Intravenous On call to O.R. 11/15/17 1848 11/15/17 1853     Subjective: Seen and examined at bedside and felt ok. Pain was bearable. States she is feeling better. No  nausea or vomiting. No CP or SOB.    Objective: Vitals:   11/19/17 0535 11/19/17 1519 11/19/17 1555 11/19/17 1612  BP: (!) 143/59 (!) 95/58 (!) 113/46 (!) 109/50  Pulse: 77 75 73 73  Resp: 19  18 18   Temp: 98.6 F (37 C) 98.5 F (36.9 C) 98.9 F (37.2 C) 98.1 F (36.7 C)  TempSrc: Oral Oral Oral Oral  SpO2: 100% 91%  93%  Weight:      Height:        Intake/Output Summary (Last 24 hours) at 11/19/2017 1757 Last data filed at 11/19/2017 1612 Gross per 24 hour  Intake 796.25 ml  Output 700 ml  Net 96.25 ml   Filed Weights   11/18/17 1300  Weight: (!) 138.3 kg (304 lb 14.3 oz)   Examination: Physical Exam:  Constitutional: WN/WD Morbidly obese Caucasian female in NAD and appears calm  Eyes: Sclerae anicteric. Lids normal ENMT: External Ears and Nose appear normal. Grossly normal hearing Neck: Supple. JVD difficult to assess due to body habitus Respiratory: Diminished to Auscultation; no appreciable  wheezing/rales/rhonchi; Unlabored Breathing  Cardiovascular: Irregularly Irregular but not tachycardic No appreciable m/r/g. Slight LE noted in right leg  Abdomen: Soft, NT, Distended due to Body habitus; Bowel sounds present GU: Deferred Musculoskeletal: No contractures; No cyanosis Skin: Warm and Dry; Left Leg wrapped with Wound VAC in place to suction on wall Neurologic: CN 2-12 grossly intact. No appreciable focal deficits Psychiatric: Normal mood and affect. Intact judgement and insight and Alert and Oriented x3  Data Reviewed: I have personally reviewed following labs and imaging studies  CBC: Recent Labs  Lab 11/15/17 0402 11/16/17 0552 11/17/17 0647 11/18/17 0548 11/19/17 0358 11/19/17 1305  WBC 15.7* 14.3* 14.7* 15.1* 15.0*  --   NEUTROABS  --   --   --  13.1* 12.3*  --   HGB 10.9* 9.7* 9.5* 7.7* 8.2* 8.2*  HCT 35.2* 32.9* 31.0* 24.6* 25.6* 25.9*  MCV 86.3 89.2 89.1 88.8 87.7  --   PLT 218 178 178 173 186  --    Basic Metabolic Panel: Recent Labs  Lab  11/14/17 1030  11/15/17 0402 11/16/17 0552 11/17/17 0647 11/18/17 0548 11/19/17 0358  NA  --    < > 139 140 139 134* 133*  K  --    < > 3.3* 3.9 3.8 3.2* 3.9  CL  --    < > 91* 92* 91* 89* 90*  CO2  --    < > 33* 35* 36* 31 33*  GLUCOSE  --    < > 219* 194* 198* 231* 286*  BUN  --    < > 51* 43* 44* 50* 52*  CREATININE  --    < > 1.72* 1.59* 1.53* 1.79* 1.71*  CALCIUM  --    < > 8.6* 8.3* 8.2* 7.8* 8.0*  MG 2.0  --   --   --   --  1.8 2.1  PHOS  --   --   --   --   --  2.9 2.3*   < > = values in this interval not displayed.   GFR: Estimated Creatinine Clearance: 48.2 mL/min (A) (by C-G formula based on SCr of 1.71 mg/dL (H)). Liver Function Tests: Recent Labs  Lab 11/14/17 1158 11/18/17 0548 11/19/17 0358  AST 30 25 22   ALT 20 13* 6*  ALKPHOS 77 58 79  BILITOT 0.7 0.8 0.9  PROT 8.3* 6.4* 6.4*  ALBUMIN 3.8 2.9* 2.5*   No results for input(s): LIPASE, AMYLASE in the last 168 hours. No results for input(s): AMMONIA in the last 168 hours. Coagulation Profile: Recent Labs  Lab 11/16/17 1119 11/16/17 1920 11/17/17 0647 11/18/17 0548 11/19/17 0358  INR 2.08 1.78 1.47 1.30 1.19   Cardiac Enzymes: No results for input(s): CKTOTAL, CKMB, CKMBINDEX, TROPONINI in the last 168 hours. BNP (last 3 results) No results for input(s): PROBNP in the last 8760 hours. HbA1C: No results for input(s): HGBA1C in the last 72 hours. CBG: Recent Labs  Lab 11/18/17 1647 11/18/17 2126 11/19/17 0745 11/19/17 1219 11/19/17 1713  GLUCAP 239* 321* 265* 226* 237*   Lipid Profile: No results for input(s): CHOL, HDL, LDLCALC, TRIG, CHOLHDL, LDLDIRECT in the last 72 hours. Thyroid Function Tests: No results for input(s): TSH, T4TOTAL, FREET4, T3FREE, THYROIDAB in the last 72 hours. Anemia Panel: No results for input(s): VITAMINB12, FOLATE, FERRITIN, TIBC, IRON, RETICCTPCT in the last 72 hours. Sepsis Labs: No results for input(s): PROCALCITON, LATICACIDVEN in the last 168  hours.  Recent Results (from the past 240 hour(s))  Culture,  blood (Routine X 2) w Reflex to ID Panel     Status: None   Collection Time: 11/14/17  6:53 PM  Result Value Ref Range Status   Specimen Description BLOOD LEFT ANTECUBITAL  Final   Special Requests   Final    BOTTLES DRAWN AEROBIC AND ANAEROBIC Blood Culture adequate volume   Culture   Final    NO GROWTH 5 DAYS Performed at [facility], [address] [place], [place] [address]    Report Status 11/19/2017 FINAL  Final  Culture, blood (Routine X 2) w Reflex to ID Panel     Status: None   Collection Time: 11/14/17  6:57 PM  Result Value Ref Range Status   Specimen Description BLOOD RIGHT WRIST  Final   Special Requests   Final    BOTTLES DRAWN AEROBIC AND ANAEROBIC Blood Culture adequate volume   Culture   Final    NO GROWTH 5 DAYS Performed at [facility], [place] [address] [place], [place] [address]    Report Status 11/19/2017 FINAL  Final  Surgical pcr screen     Status: None   Collection Time: 11/15/17  5:47 AM  Result Value Ref Range Status   MRSA, PCR NEGATIVE NEGATIVE Final   Staphylococcus aureus NEGATIVE NEGATIVE Final    Comment: (NOTE) The Xpert SA Assay (FDA approved for NASAL specimens in patients 51 years of age and older), is one component of a comprehensive surveillance program. It is not intended to diagnose infection nor to guide or monitor treatment. Performed at [facility], [place] [address], [place], [place] [address]     Radiology Studies: No results found. Scheduled Meds: . amiodarone  200 mg Oral Daily  . citalopram  20 mg Oral Daily  . cloNIDine  0.1 mg Oral BID  . fluticasone  1 spray Each Nare Daily  . gabapentin  100 mg Oral Daily  . insulin aspart  0-15 Units Subcutaneous TID WC  . insulin aspart  0-5 Units Subcutaneous QHS  . insulin aspart  12 Units Subcutaneous TID WC  . insulin glargine  15 Units Subcutaneous QHS  . levothyroxine  175 mcg Oral QAC breakfast   . loratadine  10 mg Oral Daily  . pantoprazole  40 mg Oral BID  . polyethylene glycol  17 g Oral Daily  . pravastatin  20 mg Oral q1800  . warfarin  7.5 mg Oral ONCE-1800  . Warfarin - Pharmacist Dosing Inpatient   Does not apply q1800   Continuous Infusions:   LOS: 5 days   Kerney Elbe, DO [facility] Pager [phone]  If 7PM-7AM, please contact night-coverage [email or web address] Password [facility] 11/19/2017, 5:57 PM

## 2017-11-19 NOTE — Progress Notes (Addendum)
Patient ID: Andrea Rivers, female   DOB: 28-Nov-1955, 62 y.o.   MRN: [id number]     Subjective:  Patient reports pain as mild.  Patient reports improvement and Denies any CP or SOB  Objective:   VITALS:   Vitals:   11/19/17 0535 11/19/17 1519 11/19/17 1555 11/19/17 1612  BP: (!) 143/59 (!) 95/58 (!) 113/46 (!) 109/50  Pulse: 77 75 73 73  Resp: 19  18 18   Temp: 98.6 F (37 C) 98.5 F (36.9 C) 98.9 F (37.2 C) 98.1 F (36.7 C)  TempSrc: Oral Oral Oral Oral  SpO2: 100% 91%  93%  Weight:      Height:        ABD soft Sensation intact distally Dorsiflexion/Plantar flexion intact Incision: dressing C/D/I and moderate drainage Wound vac in place and active  Lab Results  Component Value Date   WBC 15.0 (H) 11/19/2017   HGB 8.2 (L) 11/19/2017   HCT 25.9 (L) 11/19/2017   MCV 87.7 11/19/2017   PLT 186 11/19/2017   BMET    Component Value Date/Time   NA 133 (L) 11/19/2017 0358   K 3.9 11/19/2017 0358   CL 90 (L) 11/19/2017 0358   CO2 33 (H) 11/19/2017 0358   GLUCOSE 286 (H) 11/19/2017 0358   BUN 52 (H) 11/19/2017 0358   CREATININE 1.71 (H) 11/19/2017 0358   CALCIUM 8.0 (L) 11/19/2017 0358   GFRNONAA 31 (L) 11/19/2017 0358   GFRAA 36 (L) 11/19/2017 0358     Assessment/Plan: 2 Days Post-Op   Principal Problem:   Fracture Active Problems:   Chronic diastolic heart failure (HCC)   HTN (hypertension)   Diabetes (HCC)   Obstructive sleep apnea   Iron deficiency anemia   CKD (chronic kidney disease)   Hypothyroidism   Atrial fibrillation (HCC)   Anxiety state   Peripheral neuropathy   Closed displaced supracondylar fracture of distal end of left femur with intracondylar extension (HCC)   Advance diet Up with therapy Continue plan per medicine Plan for DC wound vac tomorrow NWB LLE okay for ROM     Andrea Rivers, Andrea Rivers 11/19/2017, 4:17 PM  Discussed and agree with above.   ABLA observe.    Marchia Bond, MD Cell [phone]

## 2017-11-20 DIAGNOSIS — E039 Hypothyroidism, unspecified: Secondary | ICD-10-CM

## 2017-11-20 LAB — CBC WITH DIFFERENTIAL/PLATELET
BASOS ABS: 0 10*3/uL (ref 0.0–0.1)
Basophils Relative: 0 %
Eosinophils Absolute: 0.2 10*3/uL (ref 0.0–0.7)
Eosinophils Relative: 1 %
HEMATOCRIT: 28.5 % — AB (ref 36.0–46.0)
Hemoglobin: 9 g/dL — ABNORMAL LOW (ref 12.0–15.0)
LYMPHS PCT: 9 %
Lymphs Abs: 1.2 10*3/uL (ref 0.7–4.0)
MCH: 28.2 pg (ref 26.0–34.0)
MCHC: 31.6 g/dL (ref 30.0–36.0)
MCV: 89.3 fL (ref 78.0–100.0)
Monocytes Absolute: 1.1 10*3/uL — ABNORMAL HIGH (ref 0.1–1.0)
Monocytes Relative: 8 %
NEUTROS ABS: 11.1 10*3/uL — AB (ref 1.7–7.7)
NEUTROS PCT: 82 %
PLATELETS: 196 10*3/uL (ref 150–400)
RBC: 3.19 MIL/uL — AB (ref 3.87–5.11)
RDW: 16.8 % — ABNORMAL HIGH (ref 11.5–15.5)
WBC: 13.6 10*3/uL — AB (ref 4.0–10.5)

## 2017-11-20 LAB — COMPREHENSIVE METABOLIC PANEL
ALT: 6 U/L — AB (ref 14–54)
AST: 19 U/L (ref 15–41)
Albumin: 2.3 g/dL — ABNORMAL LOW (ref 3.5–5.0)
Alkaline Phosphatase: 78 U/L (ref 38–126)
Anion gap: 12 (ref 5–15)
BILIRUBIN TOTAL: 1.2 mg/dL (ref 0.3–1.2)
BUN: 47 mg/dL — ABNORMAL HIGH (ref 6–20)
CHLORIDE: 94 mmol/L — AB (ref 101–111)
CO2: 32 mmol/L (ref 22–32)
Calcium: 8.5 mg/dL — ABNORMAL LOW (ref 8.9–10.3)
Creatinine, Ser: 1.54 mg/dL — ABNORMAL HIGH (ref 0.44–1.00)
GFR calc Af Amer: 41 mL/min — ABNORMAL LOW (ref >60)
GFR, EST NON AFRICAN AMERICAN: 35 mL/min — AB (ref >60)
Glucose, Bld: 220 mg/dL — ABNORMAL HIGH (ref 65–99)
Potassium: 3.8 mmol/L (ref 3.5–5.1)
Sodium: 138 mmol/L (ref 135–145)
Total Protein: 6.3 g/dL — ABNORMAL LOW (ref 6.5–8.1)

## 2017-11-20 LAB — TYPE AND SCREEN
ABO/RH(D): O POS
ANTIBODY SCREEN: NEGATIVE
Unit division: 0
Unit division: 0

## 2017-11-20 LAB — GLUCOSE, CAPILLARY
GLUCOSE-CAPILLARY: 198 mg/dL — AB (ref 65–99)
GLUCOSE-CAPILLARY: 352 mg/dL — AB (ref 65–99)
Glucose-Capillary: 164 mg/dL — ABNORMAL HIGH (ref 65–99)
Glucose-Capillary: 187 mg/dL — ABNORMAL HIGH (ref 65–99)
Glucose-Capillary: 267 mg/dL — ABNORMAL HIGH (ref 65–99)

## 2017-11-20 LAB — BPAM RBC
BLOOD PRODUCT EXPIRATION DATE: 201905022359
BLOOD PRODUCT EXPIRATION DATE: 201905082359
ISSUE DATE / TIME: 201904131550
ISSUE DATE / TIME: 201904121500
UNIT TYPE AND RH: 5100
Unit Type and Rh: 5100

## 2017-11-20 LAB — PROTIME-INR
INR: 1.21
Prothrombin Time: 15.2 seconds (ref 11.4–15.2)

## 2017-11-20 LAB — MAGNESIUM: MAGNESIUM: 2.2 mg/dL (ref 1.7–2.4)

## 2017-11-20 LAB — PHOSPHORUS: Phosphorus: 2.1 mg/dL — ABNORMAL LOW (ref 2.5–4.6)

## 2017-11-20 MED ORDER — INSULIN ASPART 100 UNIT/ML ~~LOC~~ SOLN
0.0000 [IU] | Freq: Every day | SUBCUTANEOUS | Status: DC
Start: 2017-11-20 — End: 2017-11-21

## 2017-11-20 MED ORDER — TORSEMIDE 20 MG PO TABS
40.0000 mg | ORAL_TABLET | Freq: Two times a day (BID) | ORAL | Status: DC
  Administered 2017-11-20 – 2017-11-21 (×2): 40 mg via ORAL
  Filled 2017-11-20 (×3): qty 2

## 2017-11-20 MED ORDER — INSULIN ASPART 100 UNIT/ML ~~LOC~~ SOLN
0.0000 [IU] | Freq: Three times a day (TID) | SUBCUTANEOUS | Status: DC
  Administered 2017-11-20 – 2017-11-21 (×3): 11 [IU] via SUBCUTANEOUS

## 2017-11-20 MED ORDER — K PHOS MONO-SOD PHOS DI & MONO 155-852-130 MG PO TABS
500.0000 mg | ORAL_TABLET | Freq: Two times a day (BID) | ORAL | Status: AC
  Administered 2017-11-20 (×2): 500 mg via ORAL
  Filled 2017-11-20 (×2): qty 2

## 2017-11-20 MED ORDER — MOMETASONE FURO-FORMOTEROL FUM 200-5 MCG/ACT IN AERO
2.0000 | INHALATION_SPRAY | Freq: Two times a day (BID) | RESPIRATORY_TRACT | Status: DC
  Filled 2017-11-20 (×2): qty 8.8

## 2017-11-20 MED ORDER — POTASSIUM CHLORIDE CRYS ER 20 MEQ PO TBCR
40.0000 meq | EXTENDED_RELEASE_TABLET | Freq: Every day | ORAL | Status: AC
  Administered 2017-11-20 – 2017-11-21 (×2): 40 meq via ORAL
  Filled 2017-11-20 (×2): qty 2

## 2017-11-20 MED ORDER — WARFARIN SODIUM 7.5 MG PO TABS
7.5000 mg | ORAL_TABLET | Freq: Once | ORAL | Status: AC
  Administered 2017-11-20: 7.5 mg via ORAL
  Filled 2017-11-20: qty 1

## 2017-11-20 NOTE — Progress Notes (Signed)
ANTICOAGULATION CONSULT NOTE - Follow Up Consult  Pharmacy Consult for Warfarin Indication: atrial fibrillation  Allergies  Allergen Reactions  . Other Other (See Comments)    ILOSONE- Caused GI distress also  . Latex Hives  . Exenatide Nausea Only and Other (See Comments)    Byetta- Nausea and abdominal pain, also  . Garlic Other (See Comments)    Severe acid reflux  . Onion Other (See Comments)    Severe acid reflux  . Rosiglitazone Other (See Comments)    Avandia- Affected heart  . Erythromycin Nausea And Vomiting    GI DISTRESS    Patient Measurements: Height: 5\' 5"  (165.1 cm) Weight: (!) 304 lb 14.3 oz (138.3 kg) IBW/kg (Calculated) : 57  Vital Signs: Temp: 98.5 F (36.9 C) (04/14 0557) Temp Source: Oral (04/14 0557) BP: 130/65 (04/14 0557) Pulse Rate: 70 (04/14 0557)  Labs: Recent Labs    11/18/17 0548 11/19/17 0358 11/19/17 1305 11/20/17 0543  HGB 7.7* 8.2* 8.2* 9.0*  HCT 24.6* 25.6* 25.9* 28.5*  PLT 173 186  --  196  LABPROT 16.0* 15.0  --  15.2  INR 1.30 1.19  --  1.21  CREATININE 1.79* 1.71*  --  1.54*    Estimated Creatinine Clearance: 53.5 mL/min (A) (by C-G formula based on SCr of 1.54 mg/dL (H)).  Assessment: 61 year old female on warfarin prior to admission with atrial fibrillation admitted 11/14/17 status post mechanical fall requiring ORIF for L-distal femur fracture. INR on admission was 2.20 on regimen of Warfarin 6 mg po daily. Warfarin was held for surgery 4/8 through 4/12. Patient received Vitamin K 5mg  and FFP on 4/10. Warfarin was resumed 4/13 per ok from Ortho and TRH.  INR today 1.21 after 1 dose of 7.5mg , using higher than home dose due to vitamin K resistance. Hgb up to 9 s/p 2 unit of PRBCs. No overt bleeding reported. Bruising noted on skin.  Goal of Therapy:  INR 2-3 Monitor platelets by anticoagulation protocol: Yes   Plan:  Warfarin 7.5 mg po x1 again tonight.  Daily PT/INR Monitor CBC and for signs/symptoms of  bleeding  Sloan Leiter, PharmD, BCPS, BCCCP Clinical Pharmacist Clinical phone 11/20/2017 until 3:30PM- [phone] After hours, please call [phone] 11/20/2017,8:17 AM

## 2017-11-20 NOTE — Progress Notes (Signed)
PROGRESS NOTE    TACHE BOBST  CXK:[id number] DOB: 1956/07/14 DOA: 11/14/2017 PCP: Sofie Hartigan, MD   Brief Narrative:  Andrea Rivers is a 62 y.o. year old female with medical history significant for PAF on coumadin, diastolic CHF, HTN, CKD, stage III (baseline creatinine: 1.6-1.8), T2DM , OSA not on CPAP who presented to [facility] on 4/8 as a transfer from [facility] ED for a  left distal femur comminuted fracture after mechanical fall.  Per patient she tripped on bedside comforter and landed on her left knee. Prior to this she was completely independent with her ADLs and iADLs.  She is adherent with all her chronic medications; the only change she made was an extra dose of metolazone prior to admission for extra fluid build up she noticed. Admitted and underwent Surgery 4/11/ and is doing well post-operatively but dropped her Hb/Hct so she was transfused 2 units of pRBCs. Resumed Anticoagulation with Coumadin yesterday given no bleeding noted as well as home Torsemide today.   Assessment & Plan:   Principal Problem:   Fracture Active Problems:   Chronic diastolic heart failure (HCC)   HTN (hypertension)   Diabetes (HCC)   Obstructive sleep apnea   Iron deficiency anemia   CKD (chronic kidney disease)   Hypothyroidism   Atrial fibrillation (HCC)   Anxiety state   Peripheral neuropathy   Closed displaced supracondylar fracture of distal end of left femur with intracondylar extension (HCC)  Left comminuted distal fracture, after mechanical fall s/p ORIF POD3 -Patient tripped after foot got tangled in comforter on bed -X-ray (OSH) Right distal comminuted fracture with anterior displacement, joint effusion -EDP spoke with Dr. Altamese [facility] (orthopedic), seen by orthopedics. 4/9,  -Surgery was on hold because of elevated INR but brought down with Vitamin K and attempted FFP (due to itching) -Ortho and ORIF done 11/17/17  -PT eval after surgery -Pain control:  IV morphine every  2-4 mg q4 hours as needed Severe Pain, Oxycodone 5-10 mg every 4 hours as needed moderate breakthrough pain -C/w Bowel regimen: miralax scheduled, PRN senna -Further Care per Ortho and resumed Coumadin yesterday  -Ortho Recommending Advancing Diet, Up with Therapy, D/C'ing Wound Vac in AM and NWB LLE for ROM -Discussed with social worker about placement and she is affecting felt who is today in anticipation for placement tomorrow for hopefully a facility in [place].  Hypokalemia -Likely related to Diuretics; Held Torsemide and [employer] but will resume Torsemide Today  -K+ was 3.2 and improved to 3.8 -Replete with po Potassium Chloride 40 mEQ Daily now that Torsemide is restarting  -Continue to Monitor and Replete as Necessary -Repeat CMP in AM  Leukocytosis, improving  -Most likely Stress related to acute fracture -Without infectious signs or symptoms and afebrile -Urinalysis showed Trace Leukocytes, Negative Nitrites, 6-30 WBC -CXR showed No Active Disease   -Blood Cx NGTD at 5 Days -WBC went from 14.3 -> 14.7 -> 15.1 -> 15.0 -> 13.6 -Continue to Monitor and repeat CBC in AM   Atrial Fibrillation -INR trended down and now 1.19; Now INR is 1.21  -Held Coumadin therapy in setting of upcoming surgery -Continue home Amiodarone 200 mg po Daily and upon further review is not on Home Metoprolol -INR was reversed Due to itching , hypoxia FFP held, will need to reverse INR with vitamin K   -Resumed Coumadin today as it was ok with Ortho and no Active Bleed  Diastolic CHF -Had slightly more edema of right ankle on exam  today -Resumed diuretics post surgery of Torsemide 40 mg po BID today  -Daily weights, I/Os, closely monitor volume status -Patient is -542.5 mL and Weight is 138.3 Kg (never weighed yesterday or today)  CKD, stage 3, stable - creatinine stable at baseline and BUN/Cr now 44/1.53 -> 50/1.79 -> 52/1.71 -> 47/1.54 - Continue to Monitor and repeat CMP in AM   Chronic IDA,  stable and Acute Blood Loss Anemia Post-Operatively -Hb/Hct now dropped to 9.5/31.0 -> 7.7/24.6 after 2 units went to 9.0/28.5  -No longer on oral iron -Gets infusion therapy as Outpatient and will need to follow up at D/C -Type and Screenedd and Transfused 2 units of pRBC -Continue to Monitor with Daily CBC  Insulin Dependent T2DM -HbA1c 7.8%(11/2017) -Hold home Glimepiride 2 mg p.o. daily with breakfast, as well as hold Linagliptin 5 mg p.o. daily -Fairly large doses of Humalog at home -Adjusted Insulin doses; C/w Lantus 20 units sq daily, Novolog 12 units TID wm, Increase Moderate Novolog SSI AC/HS to Resistant Novolog SSI AC/HS -Consulted Diabetes Coordinator  -CBG's ranging from 198-352  Hypothyroidism,stable -TSH 2.7 (09/2017) -C/w Levothyroxine 175 mcg po Daily   Vertigo, chronic, stable -C/w Meclizine 12.5 mg po TIDprn Dizziness  GERD, chronic, stable -C/w Pantoprazole 40 mg po BID   Hyperlipidemia -C/w Home Lovastatin 20 mg substitution with Pravastatin 20 mg po daily   Hyponatremia/Hypochloremia -Na+ was 133 and Chloride was 90 yesteterday -Na+ was 138 and Chloride was 94 Today -Continue to Monitor and was given Gentle IVF rehydration 50 mL/hr x12 hours yesterday and Diuretics restarted today -Repeat CMP in AM  Hypophosphatemia -Patient's Phos Level was 2.1 this AM -Replete with K-Phos neutral 500 mg tablets p.o. twice daily for 2 doses -Continue to monitor and replete phosphorus as necessary -Repeat phosphorus level in a.m.  DVT prophylaxis: Resumed Coumadin today Code Status: FULL CODE Family Communication: No family present at bedside  Disposition Plan: SNF at D/C  Consultants:   Orthopedic Surgery    Procedures: ORIF   Antimicrobials:  Anti-infectives (From admission, onward)   Start     Dose/Rate Route Frequency Ordered Stop   11/17/17 1600  ceFAZolin (ANCEF) IVPB 2g/100 mL premix     2 g 200 mL/hr over 30 Minutes Intravenous Every 8 hours  11/17/17 1345 11/18/17 0850   11/17/17 1100  tobramycin (NEBCIN) powder  Status:  Discontinued       As needed 11/17/17 1122 11/17/17 1207   11/17/17 0902  vancomycin (VANCOCIN) powder  Status:  Discontinued       As needed 11/17/17 0903 11/17/17 1207   11/16/17 1500  ceFAZolin (ANCEF) 3 g in dextrose 5 % 50 mL IVPB  Status:  Discontinued     3 g 130 mL/hr over 30 Minutes Intravenous To [facility] 11/15/17 1317 11/17/17 1332   11/16/17 1400  ceFAZolin (ANCEF) 3 g in dextrose 5 % 50 mL IVPB  Status:  Discontinued     3 g 130 mL/hr over 30 Minutes Intravenous On call to O.R. 11/15/17 1848 11/15/17 1853     Subjective: Seen and examined at bedside and had no complaints.  States she had some pain but it is bearable.  No nausea, no vomiting, no chest pain, or shortness of breath.  Objective: Vitals:   11/20/17 0020 11/20/17 0144 11/20/17 0557 11/20/17 1402  BP: 139/66 139/66 130/65 (!) 112/44  Pulse:  75 70 77  Resp:    19  Temp:   98.5 F (36.9 C) 98.6 F (37  C)  TempSrc:   Oral Oral  SpO2:   100% 98%  Weight:      Height:        Intake/Output Summary (Last 24 hours) at 11/20/2017 1437 Last data filed at 11/20/2017 0934 Gross per 24 hour  Intake 1846.5 ml  Output 400 ml  Net 1446.5 ml   Filed Weights   11/18/17 1300  Weight: (!) 138.3 kg (304 lb 14.3 oz)   Examination: Physical Exam:  Constitutional: Well-nourished well-developed morbidly obese Caucasian female who is sitting up in bed currently in no acute distress and appears calm. Eyes: Sclera anicteric.  Lids and conjunctive are normal. ENMT: External ears and nose appear normal.  Grossly normal hearing.  Mucous membranes moist Neck: Supple. Good ROM Respiratory: Diminished to auscultation bilaterally.  No appreciable wheezing, rales, rhonchi.  Unlabored breathing and no accessory muscle usage. Cardiovascular: Regular rate and rhythm.  No appreciable murmurs, rubs, gallops.  Lower extremity edema noted in the  right leg slightly increased from yesterday. Abdomen: Soft, nontender, distended secondary to body habitus.  Bowel sounds present x4 GU: Deferred Musculoskeletal: No contractures, no cyanosis Skin: Warm and dry, left leg wrapped with wound VAC in place to suction wall.  No appreciable rashes or lesions are limited to skin evaluation. Neurologic: Cranial nerves II through XII grossly intact.  No appreciable focal deficits. Psychiatric: Normal mood and affect.  Intact judgment and insight.  Awake and alert and oriented x3.  Data Reviewed: I have personally reviewed following labs and imaging studies  CBC: Recent Labs  Lab 11/16/17 0552 11/17/17 0647 11/18/17 0548 11/19/17 0358 11/19/17 1305 11/20/17 0543  WBC 14.3* 14.7* 15.1* 15.0*  --  13.6*  NEUTROABS  --   --  13.1* 12.3*  --  11.1*  HGB 9.7* 9.5* 7.7* 8.2* 8.2* 9.0*  HCT 32.9* 31.0* 24.6* 25.6* 25.9* 28.5*  MCV 89.2 89.1 88.8 87.7  --  89.3  PLT 178 178 173 186  --  409   Basic Metabolic Panel: Recent Labs  Lab 11/14/17 1030  11/16/17 0552 11/17/17 0647 11/18/17 0548 11/19/17 0358 11/20/17 0543  NA  --    < > 140 139 134* 133* 138  K  --    < > 3.9 3.8 3.2* 3.9 3.8  CL  --    < > 92* 91* 89* 90* 94*  CO2  --    < > 35* 36* 31 33* 32  GLUCOSE  --    < > 194* 198* 231* 286* 220*  BUN  --    < > 43* 44* 50* 52* 47*  CREATININE  --    < > 1.59* 1.53* 1.79* 1.71* 1.54*  CALCIUM  --    < > 8.3* 8.2* 7.8* 8.0* 8.5*  MG 2.0  --   --   --  1.8 2.1 2.2  PHOS  --   --   --   --  2.9 2.3* 2.1*   < > = values in this interval not displayed.   GFR: Estimated Creatinine Clearance: 53.5 mL/min (A) (by C-G formula based on SCr of 1.54 mg/dL (H)). Liver Function Tests: Recent Labs  Lab 11/14/17 1158 11/18/17 0548 11/19/17 0358 11/20/17 0543  AST 30 25 22 19   ALT 20 13* 6* 6*  ALKPHOS 77 58 79 78  BILITOT 0.7 0.8 0.9 1.2  PROT 8.3* 6.4* 6.4* 6.3*  ALBUMIN 3.8 2.9* 2.5* 2.3*   No results for input(s): LIPASE, AMYLASE in  the last 168  hours. No results for input(s): AMMONIA in the last 168 hours. Coagulation Profile: Recent Labs  Lab 11/16/17 1920 11/17/17 0647 11/18/17 0548 11/19/17 0358 11/20/17 0543  INR 1.78 1.47 1.30 1.19 1.21   Cardiac Enzymes: No results for input(s): CKTOTAL, CKMB, CKMBINDEX, TROPONINI in the last 168 hours. BNP (last 3 results) No results for input(s): PROBNP in the last 8760 hours. HbA1C: No results for input(s): HGBA1C in the last 72 hours. CBG: Recent Labs  Lab 11/19/17 1713 11/19/17 2118 11/20/17 0003 11/20/17 0747 11/20/17 1146  GLUCAP 237* 163* 164* 198* 352*   Lipid Profile: No results for input(s): CHOL, HDL, LDLCALC, TRIG, CHOLHDL, LDLDIRECT in the last 72 hours. Thyroid Function Tests: No results for input(s): TSH, T4TOTAL, FREET4, T3FREE, THYROIDAB in the last 72 hours. Anemia Panel: No results for input(s): VITAMINB12, FOLATE, FERRITIN, TIBC, IRON, RETICCTPCT in the last 72 hours. Sepsis Labs: No results for input(s): PROCALCITON, LATICACIDVEN in the last 168 hours.  Recent Results (from the past 240 hour(s))  Culture, blood (Routine X 2) w Reflex to ID Panel     Status: None   Collection Time: 11/14/17  6:53 PM  Result Value Ref Range Status   Specimen Description BLOOD LEFT ANTECUBITAL  Final   Special Requests   Final    BOTTLES DRAWN AEROBIC AND ANAEROBIC Blood Culture adequate volume   Culture   Final    NO GROWTH 5 DAYS Performed at [facility], [address], [place], [place] [address]    Report Status 11/19/2017 FINAL  Final  Culture, blood (Routine X 2) w Reflex to ID Panel     Status: None   Collection Time: 11/14/17  6:57 PM  Result Value Ref Range Status   Specimen Description BLOOD RIGHT WRIST  Final   Special Requests   Final    BOTTLES DRAWN AEROBIC AND ANAEROBIC Blood Culture adequate volume   Culture   Final    NO GROWTH 5 DAYS Performed at [facility], [place] [address] [place], [place] [address]    Report  Status 11/19/2017 FINAL  Final  Surgical pcr screen     Status: None   Collection Time: 11/15/17  5:47 AM  Result Value Ref Range Status   MRSA, PCR NEGATIVE NEGATIVE Final   Staphylococcus aureus NEGATIVE NEGATIVE Final    Comment: (NOTE) The Xpert SA Assay (FDA approved for NASAL specimens in patients 99 years of age and older), is one component of a comprehensive surveillance program. It is not intended to diagnose infection nor to guide or monitor treatment. Performed at [facility], [place] [address], [place], [place] [address]     Radiology Studies: No results found. Scheduled Meds: . amiodarone  200 mg Oral Daily  . citalopram  20 mg Oral Daily  . cloNIDine  0.1 mg Oral BID  . fluticasone  1 spray Each Nare Daily  . gabapentin  100 mg Oral Daily  . insulin aspart  0-5 Units Subcutaneous QHS  . insulin aspart  12 Units Subcutaneous TID WC  . insulin glargine  20 Units Subcutaneous QHS  . levothyroxine  175 mcg Oral QAC breakfast  . loratadine  10 mg Oral Daily  . pantoprazole  40 mg Oral BID  . phosphorus  500 mg Oral BID  . polyethylene glycol  17 g Oral Daily  . pravastatin  20 mg Oral q1800  . torsemide  40 mg Oral BID  . warfarin  7.5 mg Oral ONCE-1800  .  Warfarin - Pharmacist Dosing Inpatient   Does not apply q1800   Continuous Infusions:   LOS: 6 days   Kerney Elbe, DO [facility] Pager [phone]  If 7PM-7AM, please contact night-coverage [email or web address] Password [facility] 11/20/2017, 2:37 PM

## 2017-11-20 NOTE — Progress Notes (Addendum)
Patient ID: JAIYLA GRANADOS, female   DOB: 04/19/1956, 62 y.o.   MRN: [id number]     Subjective:  Patient reports pain as moderate to severe.  Patient in bed and in no acute distress  Objective:   VITALS:   Vitals:   11/19/17 2122 11/20/17 0020 11/20/17 0144 11/20/17 0557  BP: (!) 138/59 139/66 139/66 130/65  Pulse: 70  75 70  Resp: 18     Temp: 98.6 F (37 C)   98.5 F (36.9 C)  TempSrc: Oral   Oral  SpO2: 96%   100%  Weight:      Height:        ABD soft Sensation intact distally Dorsiflexion/Plantar flexion intact Incision: dressing C/D/I and no drainage  Wound vac removed and wound is good Dry dressing applied    Lab Results  Component Value Date   WBC 13.6 (H) 11/20/2017   HGB 9.0 (L) 11/20/2017   HCT 28.5 (L) 11/20/2017   MCV 89.3 11/20/2017   PLT 196 11/20/2017   BMET    Component Value Date/Time   NA 138 11/20/2017 0543   K 3.8 11/20/2017 0543   CL 94 (L) 11/20/2017 0543   CO2 32 11/20/2017 0543   GLUCOSE 220 (H) 11/20/2017 0543   BUN 47 (H) 11/20/2017 0543   CREATININE 1.54 (H) 11/20/2017 0543   CALCIUM 8.5 (L) 11/20/2017 0543   GFRNONAA 35 (L) 11/20/2017 0543   GFRAA 41 (L) 11/20/2017 0543     Assessment/Plan: 3 Days Post-Op   Principal Problem:   Fracture Active Problems:   Chronic diastolic heart failure (HCC)   HTN (hypertension)   Diabetes (HCC)   Obstructive sleep apnea   Iron deficiency anemia   CKD (chronic kidney disease)   Hypothyroidism   Atrial fibrillation (HCC)   Anxiety state   Peripheral neuropathy   Closed displaced supracondylar fracture of distal end of left femur with intracondylar extension (HCC)   Advance diet Up with therapy DC wound vac Dry dressing PRN Continue plan per Dr Kizzie Fantasia 11/20/2017, 12:33 PM  Discussed and agree with above.   Marchia Bond, MD Cell [phone]

## 2017-11-21 ENCOUNTER — Encounter (HOSPITAL_COMMUNITY): Payer: Self-pay | Admitting: Student

## 2017-11-21 LAB — CBC WITH DIFFERENTIAL/PLATELET
BAND NEUTROPHILS: 0 %
BASOS ABS: 0 10*3/uL (ref 0.0–0.1)
Basophils Relative: 0 %
Blasts: 0 %
EOS ABS: 0.1 10*3/uL (ref 0.0–0.7)
Eosinophils Relative: 1 %
HCT: 29.2 % — ABNORMAL LOW (ref 36.0–46.0)
HEMOGLOBIN: 8.9 g/dL — AB (ref 12.0–15.0)
LYMPHS PCT: 5 %
Lymphs Abs: 0.7 10*3/uL (ref 0.7–4.0)
MCH: 27.6 pg (ref 26.0–34.0)
MCHC: 30.5 g/dL (ref 30.0–36.0)
MCV: 90.7 fL (ref 78.0–100.0)
METAMYELOCYTES PCT: 0 %
MYELOCYTES: 0 %
Monocytes Absolute: 1 10*3/uL (ref 0.1–1.0)
Monocytes Relative: 8 %
Neutro Abs: 11.3 10*3/uL — ABNORMAL HIGH (ref 1.7–7.7)
Neutrophils Relative %: 86 %
Other: 0 %
PLATELETS: 234 10*3/uL (ref 150–400)
PROMYELOCYTES RELATIVE: 0 %
RBC: 3.22 MIL/uL — ABNORMAL LOW (ref 3.87–5.11)
RDW: 17.1 % — ABNORMAL HIGH (ref 11.5–15.5)
WBC: 13.1 10*3/uL — ABNORMAL HIGH (ref 4.0–10.5)
nRBC: 0 /100 WBC

## 2017-11-21 LAB — COMPREHENSIVE METABOLIC PANEL
ALBUMIN: 2.3 g/dL — AB (ref 3.5–5.0)
ALK PHOS: 74 U/L (ref 38–126)
ALT: 8 U/L — AB (ref 14–54)
AST: 23 U/L (ref 15–41)
Anion gap: 12 (ref 5–15)
BUN: 42 mg/dL — ABNORMAL HIGH (ref 6–20)
CALCIUM: 8.4 mg/dL — AB (ref 8.9–10.3)
CHLORIDE: 94 mmol/L — AB (ref 101–111)
CO2: 32 mmol/L (ref 22–32)
Creatinine, Ser: 1.32 mg/dL — ABNORMAL HIGH (ref 0.44–1.00)
GFR calc non Af Amer: 42 mL/min — ABNORMAL LOW (ref >60)
GFR, EST AFRICAN AMERICAN: 49 mL/min — AB (ref >60)
GLUCOSE: 234 mg/dL — AB (ref 65–99)
Potassium: 3.8 mmol/L (ref 3.5–5.1)
SODIUM: 138 mmol/L (ref 135–145)
Total Bilirubin: 1.1 mg/dL (ref 0.3–1.2)
Total Protein: 6.5 g/dL (ref 6.5–8.1)

## 2017-11-21 LAB — GLUCOSE, CAPILLARY
GLUCOSE-CAPILLARY: 263 mg/dL — AB (ref 65–99)
Glucose-Capillary: 285 mg/dL — ABNORMAL HIGH (ref 65–99)
Glucose-Capillary: 123 mg/dL — ABNORMAL HIGH (ref 65–99)

## 2017-11-21 LAB — MAGNESIUM: Magnesium: 1.8 mg/dL (ref 1.7–2.4)

## 2017-11-21 LAB — PHOSPHORUS: PHOSPHORUS: 2.4 mg/dL — AB (ref 2.5–4.6)

## 2017-11-21 MED ORDER — FLEET ENEMA 7-19 GM/118ML RE ENEM: 1.0000 | ENEMA | Freq: Once | RECTAL | 0 refills | Status: DC | PRN

## 2017-11-21 MED ORDER — K PHOS MONO-SOD PHOS DI & MONO 155-852-130 MG PO TABS
500.0000 mg | ORAL_TABLET | Freq: Two times a day (BID) | ORAL | Status: DC
  Administered 2017-11-21: 500 mg via ORAL
  Filled 2017-11-21 (×2): qty 2

## 2017-11-21 MED ORDER — POLYETHYLENE GLYCOL 3350 17 G PO PACK: 17.0000 g | PACK | Freq: Two times a day (BID) | ORAL | 0 refills | Status: DC

## 2017-11-21 MED ORDER — INSULIN GLARGINE 100 UNIT/ML ~~LOC~~ SOLN
23.0000 [IU] | Freq: Every day | SUBCUTANEOUS | Status: DC
  Filled 2017-11-21: qty 0.23

## 2017-11-21 MED ORDER — SENNOSIDES-DOCUSATE SODIUM 8.6-50 MG PO TABS: 1.0000 | ORAL_TABLET | Freq: Every evening | ORAL | 0 refills | Status: DC | PRN

## 2017-11-21 MED ORDER — WARFARIN SODIUM 7.5 MG PO TABS
7.5000 mg | ORAL_TABLET | Freq: Once | ORAL | Status: DC
  Filled 2017-11-21: qty 1

## 2017-11-21 MED ORDER — LORATADINE 10 MG PO TABS: 10.0000 mg | ORAL_TABLET | Freq: Every day | ORAL | 0 refills | Status: DC

## 2017-11-21 MED ORDER — HYDROCODONE-ACETAMINOPHEN 5-325 MG PO TABS: 1.0000 | ORAL_TABLET | Freq: Four times a day (QID) | ORAL | 0 refills | Status: AC | PRN

## 2017-11-21 MED ORDER — ALBUTEROL SULFATE (2.5 MG/3ML) 0.083% IN NEBU: 2.5000 mg | INHALATION_SOLUTION | RESPIRATORY_TRACT | 12 refills | Status: DC | PRN

## 2017-11-21 NOTE — Clinical Social Work Note (Signed)
Clinical Social Work Assessment  Patient Details  Name: Andrea Rivers MRN: [id number] Date of Birth: Mar 04, 1956  Date of referral:  11/21/17               Reason for consult:  Facility Placement, Discharge Planning                Permission sought to share information with:  Facility Sport and exercise psychologist, Family Supports Permission granted to share information::  Yes, Verbal Permission Granted  Name::     [name]  Agency::  [facility]  Relationship::  sister  Contact Information:  [phone]  Housing/Transportation Living arrangements for the past 2 months:  Single Family Home Source of Information:  Patient Patient Interpreter Needed:  None Criminal Activity/Legal Involvement Pertinent to Current Situation/Hospitalization:  No - Comment as needed Significant Relationships:  Pets, Siblings, Friend Lives with:  Self, Pets Do you feel safe going back to the place where you live?  No(currently non weight bearing) Need for family participation in patient care:  Yes (Comment)(currently non weight bearing)  Care giving concerns: Pt has caregivers that are friends come in to assist pt with ADLs, pt is currently non weight bearing, requiring higher level assist.    Facilities manager / plan:  CSW met with pt at bedside, pt amenable to SNF recommendation, states she knew she needed it as she is non weight bearing and wants good therapies to get better. Pt lives near [place] with a cat, is still legally married but does not live with her husband, relies on her sister for support as well as friends that come in to assist pt with daily living. Pt states she has been to [facility] in the past and that is her number one choice.  CSW reached out to [facility], they are able to take pt, she will need insurance authorization.   Employment status:  Disabled (Comment on whether or not currently receiving Disability) Insurance information:  Programmer, applications PT  Recommendations:  [place], 24 Hour Supervision Information / Referral to community resources:  [place]  Patient/Family's Response to care:  Pt aware of CSW role, SNF process, and [facility]. Looking forward to rehab and getting better eventually when able to tolerate weight.   Patient/Family's Understanding of and Emotional Response to Diagnosis, Current Treatment, and Prognosis:  Pt states understanding of diagnosis, current treatment, and prognosis. Pt is very knowledgeable about care, limitations for mobility at the current time, and was chatty and positive about getting better and returning home to her cat.   Emotional Assessment Appearance:  Appears stated age Attitude/Demeanor/Rapport:  Attention Seeking, Self-Confident, Engaged Affect (typically observed):  Accepting, Pleasant, Appropriate Orientation:  Oriented to Self, Oriented to Place, Oriented to  Time, Oriented to Situation Alcohol / Substance use:  Not Applicable Psych involvement (Current and /or in the community):  No (Comment)  Discharge Needs  Concerns to be addressed:  Care Coordination, Discharge Planning Concerns Readmission within the last 30 days:    Current discharge risk:  Dependent with Mobility, Physical Impairment, Lives alone Barriers to Discharge:  Ship broker, Continued Medical Work up   [employer], [place] 11/21/2017, 6:08 PM

## 2017-11-21 NOTE — Social Work (Signed)
CSW has received insurance authorization from pt first choice [facility].   Clinical Social Worker facilitated patient discharge including contacting patient family and facility to confirm patient discharge plans.  Clinical information faxed to facility and family agreeable with plan.  CSW arranged ambulance transport via [facility] to [facility].  RN to call [phone] with report for 700 hall nurse  prior to discharge.   Clinical Social Worker will sign off for now as social work intervention is no longer needed. Please consult [place] again if new need arises.  Alexander Mt, [place] Social Worker

## 2017-11-21 NOTE — Progress Notes (Signed)
ANTICOAGULATION CONSULT NOTE - Follow Up Consult  Pharmacy Consult:  Coumadin Indication: atrial fibrillation  Allergies  Allergen Reactions  . Other Other (See Comments)    ILOSONE- Caused GI distress also  . Latex Hives  . Exenatide Nausea Only and Other (See Comments)    Byetta- Nausea and abdominal pain, also  . Garlic Other (See Comments)    Severe acid reflux  . Onion Other (See Comments)    Severe acid reflux  . Rosiglitazone Other (See Comments)    Avandia- Affected heart  . Erythromycin Nausea And Vomiting    GI DISTRESS    Patient Measurements: Height: 5\' 5"  (165.1 cm) Weight: (!) 304 lb 14.3 oz (138.3 kg) IBW/kg (Calculated) : 57  Vital Signs: Temp: 98.6 F (37 C) (04/15 0508) Temp Source: Oral (04/15 0508) BP: 128/59 (04/15 0508) Pulse Rate: 70 (04/15 0508)  Labs: Recent Labs    11/19/17 0358 11/19/17 1305 11/20/17 0543 11/21/17 0421  HGB 8.2* 8.2* 9.0* 8.9*  HCT 25.6* 25.9* 28.5* 29.2*  PLT 186  --  196 234  LABPROT 15.0  --  15.2  --   INR 1.19  --  1.21  --   CREATININE 1.71*  --  1.54* 1.32*    Estimated Creatinine Clearance: 62.4 mL/min (A) (by C-G formula based on SCr of 1.32 mg/dL (H)).   Assessment: 15 YOM on Coumadin PTA for history of Afib.  Patient was admitted on 11/14/17 s/p mechanical fall requiring ORIF for L-distal femur fracture.  Coumadin was reversed with Vitamin K and FFP for surgery and then resumed on 11/19/17.  Daily PT / INR ordered but no INR reported today.  INR was 1.21 yesterday; expect INR to remain sub-therapeutic.  No bleeding reported.   Goal of Therapy:  INR 2-3 Monitor platelets by anticoagulation protocol: Yes    Plan:  Repeat Coumadin 7.5mg  PO today Daily PT / INR   Jovanka Westgate D. Mina Marble, PharmD, BCPS Pager:  [phone] 11/21/2017, 1:20 PM

## 2017-11-21 NOTE — Discharge Planning (Signed)
Patient discharged to SNF in stable condition.  

## 2017-11-21 NOTE — Discharge Summary (Signed)
Physician Discharge Summary  Andrea Rivers:[id number] DOB: [birth year] DOA: 11/14/2017  PCP: Sofie Hartigan, MD  Admit date: 11/14/2017 Discharge date: 11/21/2017  Admitted From: Home Disposition: SNF  Recommendations for Outpatient Follow-up:  1. Follow up with PCP in 1-2 weeks 2. Follow up with Ortho Dr. Doreatha Martin in 2-3 weeks 3. Please obtain PT-INR in AM  4. Please obtain CMP/CBC, Mag, Phos in one week 5. Please follow up on the following pending results:  Home Health: No  Equipment/Devices: None  Discharge Condition: Stable  CODE STATUS: FULL CODE  Diet recommendation: Heart Healthy Carb Modified Diet  Brief/Interim Summary: Andrea Vanaken Brooksis a 62 y.o.year old femalewith medical history significant for PAF on coumadin, diastolic CHF, HTN, CKD, stage III (baseline creatinine: 1.6-1.8), T2DM , OSA not on CPAP who presented to [facility] on 4/8 as a transfer from [facility] ED for a left distal femur comminuted fracture after mechanical fall. Per patient she tripped on bedside comforter and landed on her left knee. Prior to this she was completely independent with her ADLs and iADLs. She is adherent with all her chronic medications; the only change she made was an extra dose of metolazone prior to admission for extra fluid build up she noticed. Admitted and underwent Surgery 4/11/ and is doing well post-operatively but dropped her Hb/Hct so she was transfused 2 units of pRBCs.  Resumed Anticoagulation with Coumadin yesterday given no bleeding noted as well as home Torsemide today. INR never drawn this AM but patient stable to D/C to SNF and repeat INR in AM and continue Coumadin. She will need to follow up with Orthopedics as well as Coumadin Clinic for Continued INR Monitoring.   Discharge Diagnoses:  Principal Problem:   Fracture Active Problems:   Chronic diastolic heart failure (HCC)   HTN (hypertension)   Diabetes (HCC)   Obstructive sleep apnea   Iron deficiency  anemia   CKD (chronic kidney disease)   Hypothyroidism   Atrial fibrillation (HCC)   Anxiety state   Peripheral neuropathy   Closed displaced supracondylar fracture of distal end of left femur with intracondylar extension (HCC)  Left comminuted distal fracture, after mechanical fall s/p ORIF POD4 -Patient tripped after foot got tangled in comforter on bed -X-ray (OSH) Right distal comminuted fracture with anterior displacement, joint effusion -EDP spoke with Dr. Altamese Richburg (orthopedic), seen by orthopedics. 4/9,  -Surgery was on hold because of elevated INR but brought down with Vitamin K and attempted FFP (due to itching) -Ortho and ORIF done 11/17/17 -PT eval after surgery recommending SNF -Pain control while Hospitlaized: IV morphine every 2-4 mg q4 hours as needed Severe Pain, Oxycodone 5-10 mg every 4 hours as needed moderate breakthrough pain -C/w Bowel regimen: miralax scheduled, PRN senna -Further Care per Ortho and resumed Coumadin yesterday  -Ortho Recommending Advancing Diet, Up with Therapy, D/C'ing Wound Vac in AM and NWB LLE for ROM -Discussed with social worker about placement and has a bed today -D/C Pain Medication per Ortho   Hypokalemia -Likely related to Diuretics; Held Torsemide and [employer] but will resume Torsemide Today  -K+ was 3.2 and improved to 3.8 -Repleted with po Potassium Chloride 40 mEQ Daily now that Torsemide is restarting; C/w Home Potassium Chloride Dose  -Continue to Monitor and Replete as Necessary -Repeat CMP in AM  Leukocytosis, improving  -Most likely Stress related to acute fracture -Without infectious signs or symptoms and afebrile -Urinalysis showed Trace Leukocytes, Negative Nitrites, 6-30 WBC -CXR showed No Active Disease   -  Blood Cx NGTD at 5 Days -WBC went from 14.3 -> 14.7 -> 15.1 -> 15.0 -> 13.6 -> 13.1 -Continue to Monitor and repeat CBC in AM   Atrial Fibrillation -INR trended down and now 1.19; Now INR is 1.21;  Repeat Never Drawn this AM -Held Coumadin therapy in setting of Surgery -Continue home Amiodarone 200 mg po Daily and upon further review is not on Home Metoprolol -INR was reversed Due to itching , hypoxia FFP held, will need to reverse INR with vitamin K  -Resumed Coumadintoday as it was ok with Ortho and no Active Bleed -Will need to have Repeat INR and resume Coumadin Doses  Diastolic CHF -Had slightly more edema of right ankle on exam today -Resumeddiuretics postsurgery of Torsemide 40 mg po BID yesterday   -Daily weights, I/Os, closely monitor volume status -Patient is -4,202.5 mL and Weight is 138.3 Kg and repeat never documented -Continue to Monitor Volume Status  CKD, stage 3, stable - creatinine stable at baseline and BUN/Cr now 44/1.53 -> 50/1.79 -> 52/1.71 -> 47/1.54 -> 42/1.32 - Continue to Monitor and repeat CMP in AM   Chronic IDA, stable and Acute Blood Loss Anemia Post-Operatively -Hb/Hct now dropped to 9.5/31.0 -> 7.7/24.6 after 2 units went to 9.0/28.5 -> 8.9/29.2 -No longer on oral iron -Gets infusion therapy as Outpatient and will need to follow up at D/C -Type and Screenedd and Transfused 2 units of pRBC -Continue to Monitor with Daily CBC  Insulin Dependent T2DM -HbA1c 7.8%(11/2017) -Hold home Glimepiride 2 mg p.o. daily with breakfast, as well as hold Linagliptin 5 mg p.o. daily -Fairly large doses of Humalog at home -Adjusted Insulin doses; Increased  Lantus 20 units sq daily to 23 units sq Daily, Novolog 12 units TID wm, Increase Moderate Novolog SSI AC/HS to Resistant Novolog SSI AC/HS -Consulted Diabetes Coordinator  -CBG's ranging from 187-285   Hypothyroidism,stable -TSH 2.7 (09/2017) -C/w Levothyroxine 175 mcg po Daily   Vertigo, chronic, stable -C/w Meclizine 12.5 mg po TIDprn Dizziness  GERD, chronic, stable -C/w Pantoprazole 40 mg po BID   Hyperlipidemia -C/w Home Lovastatin 20 mg (substitution with Pravastatin 20 mg po daily in  the hospital)  Hyponatremia/Hypochloremia -Na+ was 133 and Chloride was 90 yesteterday -Na+ was 138 and Chloride was 94 Today -Continue to Monitor and was given Gentle IVF rehydration 50 mL/hr x12 hours yesterday and Diuretics restarted today -Repeat CMP in AM  Hypophosphatemia -Patient's Phos Level was 2.4 this AM -Replete with K-Phos neutral 500 mg tablets p.o. twice daily for 2 doses again today  -Continue to monitor and replete phosphorus as necessary -Repeat phosphorus level in a.m.  Discharge Instructions Discharge Instructions    (HEART FAILURE PATIENTS) Call MD:  Anytime you have any of the following symptoms: 1) 3 pound weight gain in 24 hours or 5 pounds in 1 week 2) shortness of breath, with or without a dry hacking cough 3) swelling in the hands, feet or stomach 4) if you have to sleep on extra pillows at night in order to breathe.   Complete by:  As directed    Call MD for:  difficulty breathing, headache or visual disturbances   Complete by:  As directed    Call MD for:  extreme fatigue   Complete by:  As directed    Call MD for:  hives   Complete by:  As directed    Call MD for:  persistant dizziness or light-headedness   Complete by:  As directed  Call MD for:  persistant nausea and vomiting   Complete by:  As directed    Call MD for:  redness, tenderness, or signs of infection (pain, swelling, redness, odor or green/yellow discharge around incision site)   Complete by:  As directed    Call MD for:  severe uncontrolled pain   Complete by:  As directed    Call MD for:  temperature >100.4   Complete by:  As directed    Diet - low sodium heart healthy   Complete by:  As directed    Discharge instructions   Complete by:  As directed    Follow up with PCP, Cardiology, and Orthopedics as an outpatient. Take all medications as prescribed.  If symptoms change or worsen please come back to the emergency room for evaluation.   Increase activity slowly   Complete by:   As directed      Allergies as of 11/21/2017      Reactions   Other Other (See Comments)   ILOSONE- Caused GI distress also   Latex Hives   Exenatide Nausea Only, Other (See Comments)   Byetta- Nausea and abdominal pain, also   Garlic Other (See Comments)   Severe acid reflux   Onion Other (See Comments)   Severe acid reflux   Rosiglitazone Other (See Comments)   Avandia- Affected heart   Erythromycin Nausea And Vomiting   GI DISTRESS      Medication List    STOP taking these medications   oxyCODONE 5 MG immediate release tablet Commonly known as:  Oxy IR/ROXICODONE     TAKE these medications   amiodarone 200 MG tablet Commonly known as:  PACERONE Take 200 mg by mouth daily.   azelastine 0.1 % nasal spray Commonly known as:  ASTELIN Place 2 sprays into both nostrils 2 (two) times daily.   CALCIUM 600-D 600-400 MG-UNIT Tabs Generic drug:  Calcium Carbonate-Vitamin D3 Take 1 tablet by mouth daily.   CENTRUM WOMEN Tabs Take 1 tablet by mouth daily.   citalopram 20 MG tablet Commonly known as:  CELEXA Take 20 mg by mouth daily.   cloNIDine 0.1 MG tablet Commonly known as:  CATAPRES Take 1 tablet (0.1 mg total) by mouth 2 (two) times daily.   co-enzyme Q-10 50 MG capsule Take 100 mg by mouth daily.   D3 HIGH POTENCY 2000 units Caps Generic drug:  Cholecalciferol Take 4,000 Units by mouth daily.   Fish Oil 1000 MG Cpdr Take 1 capsule by mouth daily.   Fluticasone-Salmeterol 250-50 MCG/DOSE Aepb Commonly known as:  ADVAIR Inhale 1 puff into the lungs 2 (two) times daily.   gabapentin 100 MG capsule Commonly known as:  NEURONTIN Take 100 mg by mouth 3 (three) times daily as needed (for nerve pain).   Garlic 1610 MG Caps Take 1,000 mg by mouth daily.   glimepiride 2 MG tablet Commonly known as:  AMARYL Take 2 mg by mouth daily with breakfast.   HUMALOG KWIKPEN 100 UNIT/ML KiwkPen Generic drug:  insulin lispro Inject 30-40 Units into the skin See  admin instructions. Inject 30-40 units into the skin three times a day, per sliding scale (breakfast, supper, and bedtime)   HYDROcodone-acetaminophen 5-325 MG tablet Commonly known as:  NORCO/VICODIN Take 1-2 tablets by mouth every 6 (six) hours as needed for up to 5 days for moderate pain.   levothyroxine 175 MCG tablet Commonly known as:  SYNTHROID, LEVOTHROID Take 175 mcg by mouth every evening.   loratadine 10  MG tablet Commonly known as:  CLARITIN Take 1 tablet (10 mg total) by mouth daily. Start taking on:  11/22/2017   lovastatin 20 MG tablet Commonly known as:  MEVACOR Take 20 mg by mouth at bedtime.   magnesium oxide 400 MG tablet Commonly known as:  MAG-OX Take 400 mg by mouth 2 (two) times daily.   meclizine 12.5 MG tablet Commonly known as:  ANTIVERT Take 1 tablet (12.5 mg total) by mouth 3 (three) times daily as needed for dizziness.   metolazone 5 MG tablet Commonly known as:  ZAROXOLYN Take 5 mg by mouth as needed (for a weight gain of 2 pounds overnight or 5 pounds in a week; MAX OF 5 TABLETS AT A TIME).   pantoprazole 40 MG tablet Commonly known as:  PROTONIX Take 40 mg by mouth 2 (two) times daily.   polyethylene glycol packet Commonly known as:  MIRALAX / GLYCOLAX Take 17 g by mouth 2 (two) times daily.   POTASSIUM PO Take 400 mg by mouth See admin instructions. Take 400 mg by mouth two times a day and take an extra 800 mg daily when taking Zaroxolyn   PROAIR HFA 108 (90 Base) MCG/ACT inhaler Generic drug:  albuterol inhale 2 puffs by mouth INTO LUNGS every 4 to 6 hours if needed for wheezing or shortness of breath What changed:  Another medication with the same name was added. Make sure you understand how and when to take each.   albuterol (2.5 MG/3ML) 0.083% nebulizer solution Commonly known as:  PROVENTIL Inhale 3 mLs (2.5 mg total) into the lungs every 4 (four) hours as needed for wheezing or shortness of breath. What changed:  You were already  taking a medication with the same name, and this prescription was added. Make sure you understand how and when to take each.   senna-docusate 8.6-50 MG tablet Commonly known as:  Senokot-S Take 1 tablet by mouth at bedtime as needed for mild constipation.   sodium phosphate 7-19 GM/118ML Enem Place 133 mLs (1 enema total) rectally once as needed for severe constipation.   torsemide 20 MG tablet Commonly known as:  DEMADEX Take 2 tablets (40 mg total) by mouth 2 (two) times daily.   TRADJENTA 5 MG Tabs tablet Generic drug:  linagliptin Take 5 mg by mouth daily.   vitamin C 500 MG tablet Commonly known as:  ASCORBIC ACID Take 500 mg by mouth daily.   warfarin 1 MG tablet Commonly known as:  COUMADIN Take 1 mg daily at 6 PM by mouth.   warfarin 5 MG tablet Commonly known as:  COUMADIN Take 5 mg by mouth daily at 6 PM.      Contact information for after-discharge care    Destination    [facility] .   Service:  Skilled Nursing Contact information: [place] [phone]             Allergies  Allergen Reactions  . Other Other (See Comments)    ILOSONE- Caused GI distress also  . Latex Hives  . Exenatide Nausea Only and Other (See Comments)    Byetta- Nausea and abdominal pain, also  . Garlic Other (See Comments)    Severe acid reflux  . Onion Other (See Comments)    Severe acid reflux  . Rosiglitazone Other (See Comments)    Avandia- Affected heart  . Erythromycin Nausea And Vomiting    GI DISTRESS   Consultations:  Orthopedic Surgery  Procedures/Studies: Ct Knee Left Wo Contrast  Result Date: 11/15/2017 CLINICAL DATA:  Patient tripped on comfort or falling on the left knee. Femoral fracture. EXAM: CT OF THE LEFT KNEE WITHOUT CONTRAST TECHNIQUE: Multidetector CT imaging of the LEFT knee was performed according to the standard protocol. Multiplanar CT image reconstructions were also generated.  COMPARISON:  11/14/2017 radiographs FINDINGS: Bones/Joint/Cartilage Acute comminuted supracondylar fracture of the left femoral metaphysis is noted. An sagittal component is seen traversing the medial femoral condyle along its medial aspect sparing the intercondylar notch and extending into the knee joint. 3 mm of offset is seen along the medial articular surface due to this fracture. The femoral condyles are 1/2 to 3/4 shaft width dorsally displaced relative to the femoral diaphysis. There is a 2.3 x 0.9 x 2.8 cm fracture fragment in the suprapatellar compartment of the knee, displaced anteriorly relative to the lateral femoral condyle. Slight impaction of the femoral diaphysis is noted on the femoral condyles. Small to moderate joint effusion is noted. Degenerative subchondral cystic change of the patella and trochlea. Osteoarthritis of the femorotibial compartment with near bone-on-bone apposition of the lateral femoral condyle with lateral tibial plateau. No tibial plateau fracture. The proximal fibula appears intact. Ligaments Suboptimally assessed by CT. Muscles and Tendons No muscle atrophy.  No acute intramuscular hemorrhage. Soft tissues Periarticular soft tissue swelling more so along the anterolateral subcutaneous soft tissues. IMPRESSION: 1. Acute T-shaped supracondylar fracture of the left femur with impaction of the femoral diaphysis on the femoral condyles, anterior displacement between 1/2 to 3/4 shaft width and with sagittal component traversing the medial femoral condyle just peripheral to the intercondylar notch. 2. Associated small suprapatellar joint effusion. 3. 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 4. Posttraumatic soft tissue swelling is seen about the anterolateral aspect of the knee. Electronically Signed   By: Ashley Royalty M.D.   On: 11/15/2017 19:27   Chest Portable 1 View  Result Date: 11/14/2017 CLINICAL DATA:  Preop radiograph.  Left femur fracture.  EXAM: PORTABLE CHEST 1 VIEW COMPARISON:  04/14/2017. FINDINGS: The patient appears rotated. Mild cardiac enlargement. No pleural effusion or edema identified. No airspace opacities. Visualized skeletal structures are unremarkable. IMPRESSION: No active disease. Electronically Signed   By: Kerby Moors M.D.   On: 11/14/2017 19:38   Dg Knee Complete 4 Views Left  Result Date: 11/14/2017 CLINICAL DATA:  Post fall.  Fell directly on LEFT knee.  Pain. EXAM: LEFT KNEE - COMPLETE 4+ VIEW COMPARISON:  None. FINDINGS: There is a comminuted fracture of the distal femur, supracondylar, with displacement. Soft tissue swelling. Joint effusion. Anterior displacement of the femoral shaft on the distal femur/condyles. No knee dislocation. Soft tissue calcification is noted above and below the joint, medially. IMPRESSION: Comminuted fracture of the distal femur, supracondylar, with anterior displacement. Soft tissue swelling with joint effusion. Electronically Signed   By: Staci Righter M.D.   On: 11/14/2017 11:07   Dg C-arm 1-60 Min  Result Date: 11/17/2017 CLINICAL DATA:  ORIF of distal femoral fracture EXAM: LEFT FEMUR 2 VIEWS; DG C-ARM 61-120 MIN COMPARISON:  11/15/2017 FLUOROSCOPY TIME:  Fluoroscopy Time:  3 minutes 50 seconds Radiation Exposure Index (if provided by the fluoroscopic device): Not available Number of Acquired Spot Images: 8 FINDINGS: The previously seen distal femoral fracture is again identified on the initial images. Fixation screws are noted traversing the distal most aspect of the femur. Subsequent fixation sideplate is noted. Multiple fixation screws are seen. The fracture fragments are in near anatomic  alignment. IMPRESSION: ORIF of distal femoral fracture Electronically Signed   By: Inez Catalina M.D.   On: 11/17/2017 11:32   Dg C-arm 1-60 Min  Result Date: 11/17/2017 CLINICAL DATA:  ORIF of distal femoral fracture EXAM: LEFT FEMUR 2 VIEWS; DG C-ARM 61-120 MIN COMPARISON:  11/15/2017  FLUOROSCOPY TIME:  Fluoroscopy Time:  3 minutes 50 seconds Radiation Exposure Index (if provided by the fluoroscopic device): Not available Number of Acquired Spot Images: 8 FINDINGS: The previously seen distal femoral fracture is again identified on the initial images. Fixation screws are noted traversing the distal most aspect of the femur. Subsequent fixation sideplate is noted. Multiple fixation screws are seen. The fracture fragments are in near anatomic alignment. IMPRESSION: ORIF of distal femoral fracture Electronically Signed   By: Inez Catalina M.D.   On: 11/17/2017 11:32   Dg C-arm 1-60 Min  Result Date: 11/17/2017 CLINICAL DATA:  ORIF of distal femoral fracture EXAM: LEFT FEMUR 2 VIEWS; DG C-ARM 61-120 MIN COMPARISON:  11/15/2017 FLUOROSCOPY TIME:  Fluoroscopy Time:  3 minutes 50 seconds Radiation Exposure Index (if provided by the fluoroscopic device): Not available Number of Acquired Spot Images: 8 FINDINGS: The previously seen distal femoral fracture is again identified on the initial images. Fixation screws are noted traversing the distal most aspect of the femur. Subsequent fixation sideplate is noted. Multiple fixation screws are seen. The fracture fragments are in near anatomic alignment. IMPRESSION: ORIF of distal femoral fracture Electronically Signed   By: Inez Catalina M.D.   On: 11/17/2017 11:32   Dg Femur Min 2 Views Left  Result Date: 11/17/2017 CLINICAL DATA:  ORIF of distal femoral fracture EXAM: LEFT FEMUR 2 VIEWS; DG C-ARM 61-120 MIN COMPARISON:  11/15/2017 FLUOROSCOPY TIME:  Fluoroscopy Time:  3 minutes 50 seconds Radiation Exposure Index (if provided by the fluoroscopic device): Not available Number of Acquired Spot Images: 8 FINDINGS: The previously seen distal femoral fracture is again identified on the initial images. Fixation screws are noted traversing the distal most aspect of the femur. Subsequent fixation sideplate is noted. Multiple fixation screws are seen. The  fracture fragments are in near anatomic alignment. IMPRESSION: ORIF of distal femoral fracture Electronically Signed   By: Inez Catalina M.D.   On: 11/17/2017 11:32   Dg Femur Port Min 2 Views Left  Result Date: 11/17/2017 CLINICAL DATA:  Status post ORIF of distal femoral fracture EXAM: LEFT FEMUR PORTABLE 2 VIEWS COMPARISON:  None. FINDINGS: Fixation sideplate and multiple fixation screws are noted. Fracture fragments are in near anatomic alignment. No other focal abnormality is noted. IMPRESSION: Status post ORIF of distal femoral fracture Electronically Signed   By: Inez Catalina M.D.   On: 11/17/2017 13:35    Subjective: Seen and examined this a.m. and stated that she had a bowel movement however she states it felt incomplete and would like a enema.  Feels well and denies any other complaints at this time.  Was concerned about her torsemide being started and reassured her that it was.  No chest pain, shortness breath, nausea, vomiting. Pain was under control at this time.  Discharge Exam: Vitals:   11/20/17 2100 11/21/17 0508  BP: (!) 128/55 (!) 128/59  Pulse: 74 70  Resp: 17 17  Temp: 98.9 F (37.2 C) 98.6 F (37 C)  SpO2: 95% 98%   Vitals:   11/20/17 1402 11/20/17 1529 11/20/17 2100 11/21/17 0508  BP: (!) 112/44 (!) 112/53 (!) 128/55 (!) 128/59  Pulse: 77  74 70  Resp: 19  17 17   Temp: 98.6 F (37 C)  98.9 F (37.2 C) 98.6 F (37 C)  TempSrc: Oral  Oral Oral  SpO2: 98%  95% 98%  Weight:      Height:       General: Pt is alert, awake, not in acute distress; bili obese Caucasian female Cardiovascular: RRR, S1/S2 +, no rubs, no gallops Respiratory: CTA bilaterally, no wheezing, no rhonchi; unlabored breathing however she was wearing supplemental O2 for comfort Abdominal: Soft, NT, Distended secondary to body habitus, bowel sounds + Extremities: Trace lower extremity edema edema, no cyanosis  The results of significant diagnostics from this hospitalization (including imaging,  microbiology, ancillary and laboratory) are listed below for reference.    Microbiology: Recent Results (from the past 240 hour(s))  Culture, blood (Routine X 2) w Reflex to ID Panel     Status: None   Collection Time: 11/14/17  6:53 PM  Result Value Ref Range Status   Specimen Description BLOOD LEFT ANTECUBITAL  Final   Special Requests   Final    BOTTLES DRAWN AEROBIC AND ANAEROBIC Blood Culture adequate volume   Culture   Final    NO GROWTH 5 DAYS Performed at [facility], [address] [place], [place] [address]    Report Status 11/19/2017 FINAL  Final  Culture, blood (Routine X 2) w Reflex to ID Panel     Status: None   Collection Time: 11/14/17  6:57 PM  Result Value Ref Range Status   Specimen Description BLOOD RIGHT WRIST  Final   Special Requests   Final    BOTTLES DRAWN AEROBIC AND ANAEROBIC Blood Culture adequate volume   Culture   Final    NO GROWTH 5 DAYS Performed at [facility], [place] [address] [place], [place] [address]    Report Status 11/19/2017 FINAL  Final  Surgical pcr screen     Status: None   Collection Time: 11/15/17  5:47 AM  Result Value Ref Range Status   MRSA, PCR NEGATIVE NEGATIVE Final   Staphylococcus aureus NEGATIVE NEGATIVE Final    Comment: (NOTE) The Xpert SA Assay (FDA approved for NASAL specimens in patients 75 years of age and older), is one component of a comprehensive surveillance program. It is not intended to diagnose infection nor to guide or monitor treatment. Performed at [facility], [place] [address] [place], [place] [address]     Labs: BNP (last 3 results) Recent Labs    01/28/17 0452 02/22/17 0755 04/14/17 1100  BNP 678.0* 845.0* 637.8*   Basic Metabolic Panel: Recent Labs  Lab 11/17/17 0647 11/18/17 0548 11/19/17 0358 11/20/17 0543 11/21/17 0421  NA 139 134* 133* 138 138  K 3.8 3.2* 3.9 3.8 3.8  CL 91* 89* 90* 94* 94*  CO2 36* 31 33* 32 32  GLUCOSE 198* 231* 286* 220* 234*  BUN 44* 50*  52* 47* 42*  CREATININE 1.53* 1.79* 1.71* 1.54* 1.32*  CALCIUM 8.2* 7.8* 8.0* 8.5* 8.4*  MG  --  1.8 2.1 2.2 1.8  PHOS  --  2.9 2.3* 2.1* 2.4*   Liver Function Tests: Recent Labs  Lab 11/18/17 0548 11/19/17 0358 11/20/17 0543 11/21/17 0421  AST 25 22 19 23   ALT 13* 6* 6* 8*  ALKPHOS 58 79 78 74  BILITOT 0.8 0.9 1.2 1.1  PROT 6.4* 6.4* 6.3* 6.5  ALBUMIN 2.9* 2.5* 2.3* 2.3*   No results for input(s): LIPASE, AMYLASE in the last  168 hours. No results for input(s): AMMONIA in the last 168 hours. CBC: Recent Labs  Lab 11/17/17 0647 11/18/17 0548 11/19/17 0358 11/19/17 1305 11/20/17 0543 11/21/17 0421  WBC 14.7* 15.1* 15.0*  --  13.6* 13.1*  NEUTROABS  --  13.1* 12.3*  --  11.1* 11.3*  HGB 9.5* 7.7* 8.2* 8.2* 9.0* 8.9*  HCT 31.0* 24.6* 25.6* 25.9* 28.5* 29.2*  MCV 89.1 88.8 87.7  --  89.3 90.7  PLT 178 173 186  --  196 234   Cardiac Enzymes: No results for input(s): CKTOTAL, CKMB, CKMBINDEX, TROPONINI in the last 168 hours. BNP: Invalid input(s): POCBNP CBG: Recent Labs  Lab 11/20/17 1146 11/20/17 1630 11/20/17 2101 11/21/17 [id number] 11/21/17 1210  GLUCAP 352* 267* 187* 263* 285*   D-Dimer No results for input(s): DDIMER in the last 72 hours. Hgb A1c No results for input(s): HGBA1C in the last 72 hours. Lipid Profile No results for input(s): CHOL, HDL, LDLCALC, TRIG, CHOLHDL, LDLDIRECT in the last 72 hours. Thyroid function studies No results for input(s): TSH, T4TOTAL, T3FREE, THYROIDAB in the last 72 hours.  Invalid input(s): FREET3 Anemia work up No results for input(s): VITAMINB12, FOLATE, FERRITIN, TIBC, IRON, RETICCTPCT in the last 72 hours. Urinalysis    Component Value Date/Time   COLORURINE YELLOW 11/16/2017 1110   APPEARANCEUR CLEAR 11/16/2017 1110   LABSPEC 1.010 11/16/2017 1110   PHURINE 6.0 11/16/2017 1110   GLUCOSEU NEGATIVE 11/16/2017 1110   HGBUR LARGE (A) 11/16/2017 1110   BILIRUBINUR NEGATIVE 11/16/2017 1110   KETONESUR NEGATIVE  11/16/2017 1110   PROTEINUR NEGATIVE 11/16/2017 1110   NITRITE NEGATIVE 11/16/2017 1110   LEUKOCYTESUR TRACE (A) 11/16/2017 1110   Sepsis Labs Invalid input(s): PROCALCITONIN,  WBC,  LACTICIDVEN Microbiology Recent Results (from the past 240 hour(s))  Culture, blood (Routine X 2) w Reflex to ID Panel     Status: None   Collection Time: 11/14/17  6:53 PM  Result Value Ref Range Status   Specimen Description BLOOD LEFT ANTECUBITAL  Final   Special Requests   Final    BOTTLES DRAWN AEROBIC AND ANAEROBIC Blood Culture adequate volume   Culture   Final    NO GROWTH 5 DAYS Performed at [facility], [place] [address], [place], [place] [address]    Report Status 11/19/2017 FINAL  Final  Culture, blood (Routine X 2) w Reflex to ID Panel     Status: None   Collection Time: 11/14/17  6:57 PM  Result Value Ref Range Status   Specimen Description BLOOD RIGHT WRIST  Final   Special Requests   Final    BOTTLES DRAWN AEROBIC AND ANAEROBIC Blood Culture adequate volume   Culture   Final    NO GROWTH 5 DAYS Performed at [facility], [place] [address] [place], [place] [address]    Report Status 11/19/2017 FINAL  Final  Surgical pcr screen     Status: None   Collection Time: 11/15/17  5:47 AM  Result Value Ref Range Status   MRSA, PCR NEGATIVE NEGATIVE Final   Staphylococcus aureus NEGATIVE NEGATIVE Final    Comment: (NOTE) The Xpert SA Assay (FDA approved for NASAL specimens in patients 75 years of age and older), is one component of a comprehensive surveillance program. It is not intended to diagnose infection nor to guide or monitor treatment. Performed at [facility], [place] [address], [place],  [address]    Time coordinating discharge: 35 minutes  SIGNED:  Bertram Savin  Aeden Matranga, DO [facility] 11/21/2017, 2:43 PM Pager [phone]  If 7PM-7AM, please contact night-coverage [email or web address] Password [email or web address]

## 2017-11-21 NOTE — Plan of Care (Signed)

## 2017-11-21 NOTE — Progress Notes (Signed)
Inpatient Diabetes Program Recommendations  AACE/ADA: New Consensus Statement on Inpatient Glycemic Control (2015)  Target Ranges:  Prepandial:   less than 140 mg/dL      [facility] postprandial:   less than 180 mg/dL (1-2 hours)      Critically ill patients:  140 - 180 mg/dL   Results for DHARMA, PARE (MRN [id number]) as of 11/21/2017 11:06  Ref. Range 11/20/2017 07:47 11/20/2017 11:46 11/20/2017 16:30 11/20/2017 21:01 11/21/2017 07:54  Glucose-Capillary Latest Ref Range: 65 - 99 mg/dL 198 (H) 352 (H) 267 (H) 187 (H) 263 (H)   Review of Glycemic Control  Current orders for Inpatient glycemic control: Lantus 20 units QHS, Novolog 12 units TID with meals for meal coverage, Novolog 0-20 units TID with meals, Novolog 0-5 units QHS  Inpatient Diabetes Program Recommendations: Insulin - Basal: Please consider increasing Lantus to 23 units QHS. Insulin - Meal Coverage: Noted meal coverage was NOT GIVEN with breakfast or supper on 11/20/17.  NURSING: Please administer Novolog meal coverage (in addition to Novolog correction) when parameters are met.  Thanks, Barnie Alderman, RN, MSN, CDE Diabetes Coordinator Inpatient Diabetes Program [phone] (Team Pager from 8am to 5pm)

## 2017-11-21 NOTE — Discharge Instructions (Signed)

## 2017-11-22 ENCOUNTER — Encounter (HOSPITAL_COMMUNITY): Payer: Self-pay | Admitting: Student

## 2017-11-24 ENCOUNTER — Encounter (HOSPITAL_COMMUNITY): Payer: Self-pay | Admitting: Student

## 2017-11-28 ENCOUNTER — Encounter (HOSPITAL_COMMUNITY): Payer: Self-pay | Admitting: Student

## 2017-12-05 NOTE — Progress Notes (Signed)
Patient ID: Spicer STOTZ, female    DOB: 09/22/1955, 62 y.o.   MRN: [id number]  HPI  Ms Deming is a 62 y/o female with a history of anemia, anxiety, CKD, COPD, DM, GERD, HTN, hypothyroidism, neuropathy, obstructive sleep apnea (+CPAP), previous tobacco use and chronic heart failure.  Echo report from 02/23/17 reviewed and showed an EF of 55-60% along with mild MR. Echo done 01/26/17 shows an EF of 50-55%.   Was in the ED 11/14/17 due to fall where she was transferred to [facility] for surgery. She had ORIF done on left knee. Given 2 units of blood post-op and was discharged after 7 days.    She presents today for a follow-up visit with a chief complaint of minimal fatigue upon moderate exertion. She says this has been chronic in nature for many years. She has associated light-headedness, pedal edema, easy bruising and left knee pain. She denies any difficulty sleeping, abdominal distention, palpitations, chest pain, shortness of breath or cough. Unable to weigh because she remains non-weight bearing due to left leg surgery.   Past Medical History:  Diagnosis Date  . Anemia   . Anxiety   . CHF (congestive heart failure) ([place])   . Chronic kidney disease    INSUFFICIENCY  . Complication of anesthesia    had to be intubated during cataract surgery   " I could not breath "  . COPD (chronic obstructive pulmonary disease) ([place])   . Diabetes mellitus without complication ([place])   . Dyspnea    DOE  . Dysrhythmia    A FIB  . Edema   . Fracture of distal femur ([place]) 11/2017   left   . GERD (gastroesophageal reflux disease)   . History of kidney stones   . Hypertension   . Hypothyroidism    ABLATION  . Iron deficiency anemia 03/31/2017  . Neuropathy   . Orthopnea   . Sleep apnea    CPAP  . Vertigo   . Wheezing    Past Surgical History:  Procedure Laterality Date  . CATARACT EXTRACTION W/PHACO Left 01/26/2017   Procedure: CATARACT EXTRACTION PHACO AND INTRAOCULAR LENS PLACEMENT (IOC);   Surgeon: Estill Cotta, MD;  Location: [facility] ORS;  Service: Ophthalmology;  Laterality: Left;  [place]   1:02.2 AP     23.7 CDE   28.92 fluid pack lot# [id number] H exp.05/08/2018  . CHOLECYSTECTOMY    . ORIF FEMUR FRACTURE Left 11/17/2017   Procedure: OPEN REDUCTION INTERNAL FIXATION (ORIF) DISTAL FEMUR FRACTURE;  Surgeon: Shona Needles, MD;  Location: [place];  Service: Orthopedics;  Laterality: Left;  . TONSILLECTOMY     Family History  Problem Relation Age of Onset  . COPD Mother   . Heart disease Mother   . Anemia Mother   . Heart disease Father   . COPD Father   . Anemia Sister   . Diabetes Maternal Grandmother   . Hypertension Paternal Grandfather    Social History   Tobacco Use  . Smoking status: Former Smoker    Packs/day: 3.00    Types: Cigarettes    Last attempt to quit: 1988    Years since quitting: 31.3  . Smokeless tobacco: Never Used  Substance Use Topics  . Alcohol use: No   Allergies  Allergen Reactions  . Other Other (See Comments)    ILOSONE- Caused GI distress also  . Latex Hives  . Exenatide Nausea Only and Other (See Comments)    Byetta- Nausea and abdominal pain,  also  . Garlic Other (See Comments)    Severe acid reflux  . Onion Other (See Comments)    Severe acid reflux  . Rosiglitazone Other (See Comments)    Avandia- Affected heart  . Erythromycin Nausea And Vomiting    GI DISTRESS   Prior to Admission medications   Medication Sig Start Date End Date Taking? Authorizing Provider  albuterol (PROAIR HFA) 108 (90 Base) MCG/ACT inhaler inhale 2 puffs by mouth INTO LUNGS every 4 to 6 hours if needed for wheezing or shortness of breath 03/14/17  Yes [provider]  albuterol (PROVENTIL) (2.5 MG/3ML) 0.083% nebulizer solution Inhale 3 mLs (2.5 mg total) into the lungs every 4 (four) hours as needed for wheezing or shortness of breath. 11/21/17  Yes Sheikh, Omair Latif, DO  Amino Acids-Protein Hydrolys (FEEDING SUPPLEMENT, PRO-STAT SUGAR FREE  64,) LIQD Take 30 mLs by mouth 3 (three) times daily with meals.   Yes [provider]  amiodarone (PACERONE) 200 MG tablet Take 200 mg by mouth daily. 05/10/17  Yes [provider]  azelastine (ASTELIN) 0.1 % nasal spray Place 2 sprays into both nostrils 2 (two) times daily. 10/24/17  Yes [provider]  Calcium Carbonate-Vitamin D3 (CALCIUM 600-D) 600-400 MG-UNIT TABS Take 1 tablet by mouth daily.   Yes [provider]  Cholecalciferol (D3 HIGH POTENCY) 2000 units CAPS Take 4,000 Units by mouth daily.    Yes [provider]  citalopram (CELEXA) 20 MG tablet Take 20 mg by mouth daily.   Yes [provider]  cloNIDine (CATAPRES) 0.1 MG tablet Take 1 tablet (0.1 mg total) by mouth 2 (two) times daily. 07/12/17  Yes Makynzee Tigges, Otila Kluver A, FNP  co-enzyme Q-10 50 MG capsule Take 100 mg by mouth daily.    Yes [provider]  cyclobenzaprine (FLEXERIL) 5 MG tablet Take 5 mg by mouth 3 (three) times daily as needed for muscle spasms.   Yes [provider]  Fluticasone-Salmeterol (ADVAIR) 250-50 MCG/DOSE AEPB Inhale 1 puff into the lungs 2 (two) times daily.   Yes [provider]  gabapentin (NEURONTIN) 100 MG capsule Take 100 mg by mouth 3 (three) times daily as needed (for nerve pain).  09/05/17 09/05/18 Yes [provider]  Garlic 6712 MG CAPS Take 1,000 mg by mouth daily.    Yes [provider]  HYDROcodone-acetaminophen (NORCO) 10-325 MG tablet Take 1 tablet by mouth every 6 (six) hours as needed.   Yes [provider]  insulin aspart (NOVOLOG) 100 UNIT/ML injection Inject into the skin 3 (three) times daily before meals.   Yes [provider]  levothyroxine (SYNTHROID, LEVOTHROID) 175 MCG tablet Take 175 mcg by mouth every evening.    Yes [provider]  loratadine (CLARITIN) 10 MG tablet Take 1 tablet (10 mg total) by mouth daily. 11/22/17  Yes Sheikh, Omair Latif, DO  lovastatin  (MEVACOR) 20 MG tablet Take 20 mg by mouth at bedtime.   Yes [provider]  magnesium oxide (MAG-OX) 400 MG tablet Take 400 mg by mouth 2 (two) times daily.   Yes [provider]  meclizine (ANTIVERT) 12.5 MG tablet Take 1 tablet (12.5 mg total) by mouth 3 (three) times daily as needed for dizziness. 03/03/17  Yes Gladstone Lighter, MD  metolazone (ZAROXOLYN) 5 MG tablet Take 5 mg by mouth as needed (for a weight gain of 2 pounds overnight or 5 pounds in a week; MAX OF 5 TABLETS AT A TIME).  04/18/17  Yes  [provider]  Multiple Vitamins-Minerals (CENTRUM WOMEN) TABS Take 1 tablet by mouth daily.    Yes [provider]  Omega-3 Fatty Acids (FISH OIL) 1000 MG CPDR Take 1 capsule by mouth daily.   Yes [provider]  pantoprazole (PROTONIX) 40 MG tablet Take 40 mg by mouth 2 (two) times daily.   Yes [provider]  polyethylene glycol (MIRALAX / GLYCOLAX) packet Take 17 g by mouth 2 (two) times daily. 11/21/17  Yes Sheikh, Omair Latif, DO  POTASSIUM PO Take 400 mg by mouth See admin instructions. Take 400 mg by mouth two times a day and take an extra 800 mg daily when taking Zaroxolyn   Yes [provider]  senna-docusate (SENOKOT-S) 8.6-50 MG tablet Take 1 tablet by mouth at bedtime as needed for mild constipation. 11/21/17  Yes Sheikh, Omair Latif, DO  sodium phosphate (FLEET) 7-19 GM/118ML ENEM Place 133 mLs (1 enema total) rectally once as needed for severe constipation. 11/21/17  Yes Sheikh, Omair Latif, DO  torsemide (DEMADEX) 20 MG tablet Take 2 tablets (40 mg total) by mouth 2 (two) times daily. 09/09/17  Yes Anaissa Macfadden, Otila Kluver A, FNP  vitamin C (ASCORBIC ACID) 500 MG tablet Take 500 mg by mouth daily.   Yes [provider]  zinc sulfate 220 (50 Zn) MG capsule Take 220 mg by mouth daily.   Yes [provider]  glimepiride (AMARYL) 2 MG tablet Take 2 mg by mouth daily with breakfast.    [provider]  warfarin  (COUMADIN) 1 MG tablet Take 1 mg daily at 6 PM by mouth.     [provider]  warfarin (COUMADIN) 5 MG tablet Take 5 mg by mouth daily at 6 PM.  04/22/17   [provider]    Review of Systems  Constitutional: Positive for fatigue. Negative for appetite change.  HENT: Negative for congestion, postnasal drip and sore throat.   Eyes: Positive for visual disturbance (blurry vision in left eye). Negative for pain.  Respiratory: Negative for cough, chest tightness and shortness of breath.   Cardiovascular: Positive for leg swelling ("better"). Negative for chest pain and palpitations.  Gastrointestinal: Negative for abdominal distention and abdominal pain.  Endocrine: Negative.   Genitourinary: Negative.   Musculoskeletal: Positive for arthralgias (left knee) and back pain.  Skin: Negative.   Allergic/Immunologic: Negative.   Neurological: Positive for light-headedness (due to vertigo). Negative for dizziness.  Hematological: Negative for adenopathy. Bruises/bleeds easily.  Psychiatric/Behavioral: Positive for dysphoric mood. Negative for sleep disturbance (sleeping on 6 pillows due to comfort). The patient is not nervous/anxious.    Vitals:   12/07/17 1017  BP: 136/64  Pulse: 65  Resp: 18  SpO2: 97%   Wt Readings from Last 3 Encounters:  11/21/17 (!) 321 lb 14 oz (146 kg)  11/14/17 (!) 304 lb 12.8 oz (138.3 kg)  09/29/17 (!) 305 lb 1.9 oz (138.4 kg)   Lab Results  Component Value Date   CREATININE 1.32 (H) 11/21/2017   CREATININE 1.54 (H) 11/20/2017   CREATININE 1.71 (H) 11/19/2017    Physical Exam  Constitutional: She is oriented to person, place, and time. She appears well-developed and well-nourished.  HENT:  Head: Normocephalic and atraumatic.  Neck: Normal range of motion. Neck supple. No JVD present.  Cardiovascular: Normal rate and regular rhythm.  Pulmonary/Chest: Effort normal. She has no wheezes. She has no rales.  Abdominal: Soft. She exhibits no  distension. There is no tenderness.  Musculoskeletal: She exhibits edema (  1+ pitting edema in bilateral lower legs). She exhibits no tenderness.  Neurological: She is alert and oriented to person, place, and time.  Skin: Skin is warm and dry. Ecchymosis (left knee) noted.  Psychiatric: She has a normal mood and affect. Her behavior is normal. Thought content normal.  Nursing note and vitals reviewed.   Assessment & Plan:  1: Chronic heart failure with preserved ejection fraction- - NYHA class II - euvolemic today - unable to weigh due to patient continuing to be non-weight bearing due to leg surgery - not adding salt to her food and is currently at [facility] - saw cardiologist Ubaldo Glassing) 09/05/17 - taking torsemide 40mg  twice daily - has metolazone that she can take as needed - elevating her legs as she has to keep her left leg elevated - does not meet ReDS vest criteria due to BMI  2: HTN- - BP looks good today - saw PCP ([facility]) 11/02/17 & now sees PCP at [facility] as she's there for rehab - BMP from 11/21/17 reviewed and showed sodium 138, potassium 3.8 and GFR 42  3: Diabetes- - fasting glucose this morning was 183  - A1c from 09/20/17 was 7.3% - saw endocrinologist Eddie Dibbles) 06/21/17  Facility medication list was reviewed. Warfarin is not listed on her facility list and patient thought she was still taking it. PCP note from [facility] has it listed. Order written for staff to follow-up with Dr. Lovie Macadamia ([facility] PCP) to confirm that patient is still taking it.   Patient also asked for an order to be written for staff to see if her citalopram could be increased while she's there.   Return here in 6 months or sooner for any questions/problems before then.

## 2017-12-07 ENCOUNTER — Encounter: Payer: Self-pay | Admitting: Family

## 2017-12-07 ENCOUNTER — Ambulatory Visit: Payer: Medicare Other | Attending: Family | Admitting: Family

## 2017-12-07 VITALS — BP 136/64 | HR 65 | Resp 18

## 2017-12-07 DIAGNOSIS — F419 Anxiety disorder, unspecified: Secondary | ICD-10-CM | POA: Insufficient documentation

## 2017-12-07 DIAGNOSIS — Z794 Long term (current) use of insulin: Secondary | ICD-10-CM | POA: Insufficient documentation

## 2017-12-07 DIAGNOSIS — E1122 Type 2 diabetes mellitus with diabetic chronic kidney disease: Secondary | ICD-10-CM | POA: Diagnosis not present

## 2017-12-07 DIAGNOSIS — N189 Chronic kidney disease, unspecified: Secondary | ICD-10-CM | POA: Insufficient documentation

## 2017-12-07 DIAGNOSIS — I130 Hypertensive heart and chronic kidney disease with heart failure and stage 1 through stage 4 chronic kidney disease, or unspecified chronic kidney disease: Secondary | ICD-10-CM | POA: Insufficient documentation

## 2017-12-07 DIAGNOSIS — Z8249 Family history of ischemic heart disease and other diseases of the circulatory system: Secondary | ICD-10-CM | POA: Insufficient documentation

## 2017-12-07 DIAGNOSIS — Z9889 Other specified postprocedural states: Secondary | ICD-10-CM | POA: Diagnosis not present

## 2017-12-07 DIAGNOSIS — I5032 Chronic diastolic (congestive) heart failure: Secondary | ICD-10-CM

## 2017-12-07 DIAGNOSIS — J449 Chronic obstructive pulmonary disease, unspecified: Secondary | ICD-10-CM | POA: Diagnosis not present

## 2017-12-07 DIAGNOSIS — Z9049 Acquired absence of other specified parts of digestive tract: Secondary | ICD-10-CM | POA: Diagnosis not present

## 2017-12-07 DIAGNOSIS — Z7901 Long term (current) use of anticoagulants: Secondary | ICD-10-CM | POA: Insufficient documentation

## 2017-12-07 DIAGNOSIS — Z881 Allergy status to other antibiotic agents status: Secondary | ICD-10-CM | POA: Diagnosis not present

## 2017-12-07 DIAGNOSIS — Z87891 Personal history of nicotine dependence: Secondary | ICD-10-CM | POA: Insufficient documentation

## 2017-12-07 DIAGNOSIS — N183 Chronic kidney disease, stage 3 (moderate): Secondary | ICD-10-CM

## 2017-12-07 DIAGNOSIS — Z833 Family history of diabetes mellitus: Secondary | ICD-10-CM | POA: Diagnosis not present

## 2017-12-07 DIAGNOSIS — Z79899 Other long term (current) drug therapy: Secondary | ICD-10-CM | POA: Diagnosis not present

## 2017-12-07 DIAGNOSIS — I509 Heart failure, unspecified: Secondary | ICD-10-CM | POA: Insufficient documentation

## 2017-12-07 DIAGNOSIS — K219 Gastro-esophageal reflux disease without esophagitis: Secondary | ICD-10-CM | POA: Insufficient documentation

## 2017-12-07 DIAGNOSIS — I10 Essential (primary) hypertension: Secondary | ICD-10-CM

## 2017-12-07 DIAGNOSIS — Z888 Allergy status to other drugs, medicaments and biological substances status: Secondary | ICD-10-CM | POA: Insufficient documentation

## 2017-12-27 ENCOUNTER — Inpatient Hospital Stay: Payer: Medicare Other | Attending: Oncology

## 2017-12-27 DIAGNOSIS — D509 Iron deficiency anemia, unspecified: Secondary | ICD-10-CM | POA: Insufficient documentation

## 2017-12-27 DIAGNOSIS — D631 Anemia in chronic kidney disease: Secondary | ICD-10-CM | POA: Diagnosis not present

## 2017-12-27 DIAGNOSIS — N183 Chronic kidney disease, stage 3 unspecified: Secondary | ICD-10-CM

## 2017-12-27 DIAGNOSIS — D500 Iron deficiency anemia secondary to blood loss (chronic): Secondary | ICD-10-CM

## 2017-12-27 LAB — CBC WITH DIFFERENTIAL/PLATELET
Basophils Absolute: 0.1 10*3/uL (ref 0–0.1)
Basophils Relative: 1 %
Eosinophils Absolute: 0.1 10*3/uL (ref 0–0.7)
Eosinophils Relative: 2 %
HCT: 36.9 % (ref 35.0–47.0)
HEMOGLOBIN: 11.9 g/dL — AB (ref 12.0–16.0)
LYMPHS ABS: 1 10*3/uL (ref 1.0–3.6)
Lymphocytes Relative: 12 %
MCH: 27.2 pg (ref 26.0–34.0)
MCHC: 32.3 g/dL (ref 32.0–36.0)
MCV: 84.2 fL (ref 80.0–100.0)
MONO ABS: 0.7 10*3/uL (ref 0.2–0.9)
MONOS PCT: 9 %
NEUTROS PCT: 76 %
Neutro Abs: 6.8 10*3/uL — ABNORMAL HIGH (ref 1.4–6.5)
Platelets: 268 10*3/uL (ref 150–440)
RBC: 4.38 MIL/uL (ref 3.80–5.20)
RDW: 18 % — ABNORMAL HIGH (ref 11.5–14.5)
WBC: 8.7 10*3/uL (ref 3.6–11.0)

## 2017-12-27 LAB — FERRITIN: Ferritin: 79 ng/mL (ref 11–307)

## 2017-12-27 LAB — BASIC METABOLIC PANEL
Anion gap: 13 (ref 5–15)
BUN: 62 mg/dL — AB (ref 6–20)
CALCIUM: 9 mg/dL (ref 8.9–10.3)
CHLORIDE: 87 mmol/L — AB (ref 101–111)
CO2: 37 mmol/L — ABNORMAL HIGH (ref 22–32)
CREATININE: 1.82 mg/dL — AB (ref 0.44–1.00)
GFR, EST AFRICAN AMERICAN: 33 mL/min — AB (ref >60)
GFR, EST NON AFRICAN AMERICAN: 29 mL/min — AB (ref >60)
Glucose, Bld: 161 mg/dL — ABNORMAL HIGH (ref 65–99)
Potassium: 3.2 mmol/L — ABNORMAL LOW (ref 3.5–5.1)
SODIUM: 137 mmol/L (ref 135–145)

## 2017-12-27 LAB — IRON AND TIBC
IRON: 34 ug/dL (ref 28–170)
SATURATION RATIOS: 11 % (ref 10.4–31.8)
TIBC: 300 ug/dL (ref 250–450)
UIBC: 266 ug/dL

## 2017-12-29 ENCOUNTER — Inpatient Hospital Stay: Payer: Medicare Other

## 2017-12-29 ENCOUNTER — Other Ambulatory Visit: Payer: Self-pay

## 2017-12-29 ENCOUNTER — Inpatient Hospital Stay (HOSPITAL_BASED_OUTPATIENT_CLINIC_OR_DEPARTMENT_OTHER): Payer: Medicare Other | Admitting: Oncology

## 2017-12-29 ENCOUNTER — Encounter: Payer: Self-pay | Admitting: Oncology

## 2017-12-29 VITALS — BP 127/76 | HR 61 | Temp 96.1°F | Resp 16

## 2017-12-29 VITALS — BP 130/75 | HR 65 | Temp 96.5°F | Resp 16

## 2017-12-29 DIAGNOSIS — N183 Chronic kidney disease, stage 3 unspecified: Secondary | ICD-10-CM

## 2017-12-29 DIAGNOSIS — D509 Iron deficiency anemia, unspecified: Secondary | ICD-10-CM

## 2017-12-29 DIAGNOSIS — D631 Anemia in chronic kidney disease: Secondary | ICD-10-CM | POA: Diagnosis not present

## 2017-12-29 DIAGNOSIS — D500 Iron deficiency anemia secondary to blood loss (chronic): Secondary | ICD-10-CM

## 2017-12-29 MED ORDER — IRON SUCROSE 20 MG/ML IV SOLN
200.0000 mg | Freq: Once | INTRAVENOUS | Status: AC
  Administered 2017-12-29: 200 mg via INTRAVENOUS
  Filled 2017-12-29: qty 10

## 2017-12-29 MED ORDER — SODIUM CHLORIDE 0.9% FLUSH
10.0000 mL | INTRAVENOUS | Status: DC | PRN
  Filled 2017-12-29: qty 10

## 2017-12-29 MED ORDER — SODIUM CHLORIDE 0.9 % IV SOLN
Freq: Once | INTRAVENOUS | Status: AC
  Administered 2017-12-29: 10:00:00 via INTRAVENOUS
  Filled 2017-12-29: qty 1000

## 2017-12-29 NOTE — Progress Notes (Signed)
Hematology/Oncology Follow up visit [facility] Telephone:(336) [phone] Fax:(336) [phone]   Patient Care Team: Sofie Hartigan, MD as PCP - General (Family Medicine) Alisa Graff, FNP as Nurse Practitioner (Family Medicine) Ubaldo Glassing Javier Docker, MD as Consulting Physician (Cardiology) Elease Etienne, MD as Consulting Physician (Endocrinology)  CHIEF COMPLAINTS/REASON FOR VISIT Follow up for treatment of Anemia    HISTORY OF PRESENTING ILLNESS:  Andrea Rivers 62 y.o.  female with past medical history listed as below who was referred by Dr. Holley Raring to me for evaluation and management of anemia.  Patient was recently hospitalized due to acute respiratory failure secondary to diastolic CHF exacerbation. She was initially admitted to ICU, intubated and required mechanical ventilation. She also had acute on chronic kidney failure. Her recent labs showed microcytic anemia with hemoglobin  6.5, MCV 67.9. She received blood transfusion on 02/24/2017. Hemoglobin improved to 8.6. Patient reports fatigue, and a lack of energy. She has some lower extremity edema. She lives at home by herself and is able to do her ADLs. She takes warfarin for A. Fib. Denies any bleeding events, blood in the stool or black stool.  INTERVAL HISTORY  Patient presents for follow up for management of anemia. During the interval, she had left distal femur comminuted fracture after mechanical fall, s/p ORIF done 11/17/17. Hemoglobin dropped to 7.7, s/p 2 units of PRBC transfusion.   Review of Systems  Constitutional: Negative for appetite change, chills and fatigue.  HENT:   Negative for hearing loss and lump/mass.   Eyes: Negative for eye problems.  Respiratory: Negative for chest tightness, cough and shortness of breath.   Cardiovascular: Negative for chest pain and leg swelling.  Gastrointestinal: Negative for abdominal distention, abdominal pain, constipation, diarrhea and nausea.  Endocrine:  Negative for hot flashes.  Genitourinary: Negative for difficulty urinating, dysuria, frequency and hematuria.   Musculoskeletal: Negative for arthralgias, back pain, gait problem and neck pain.  Skin: Negative for itching and rash.  Neurological: Negative for dizziness, gait problem and headaches.  Hematological: Negative for adenopathy. Does not bruise/bleed easily.  Psychiatric/Behavioral: Negative for confusion. The patient is not nervous/anxious.    MEDICAL HISTORY:  Past Medical History:  Diagnosis Date  . Anemia   . Anxiety   . CHF (congestive heart failure) ([place])   . Chronic kidney disease    INSUFFICIENCY  . Complication of anesthesia    had to be intubated during cataract surgery   " I could not breath "  . COPD (chronic obstructive pulmonary disease) ([place])   . Diabetes mellitus without complication ([place])   . Dyspnea    DOE  . Dysrhythmia    A FIB  . Edema   . Fracture of distal femur ([place]) 11/2017   left   . GERD (gastroesophageal reflux disease)   . History of kidney stones   . Hypertension   . Hypothyroidism    ABLATION  . Iron deficiency anemia 03/31/2017  . Neuropathy   . Orthopnea   . Sleep apnea    CPAP  . Vertigo   . Wheezing     SURGICAL HISTORY: Past Surgical History:  Procedure Laterality Date  . CATARACT EXTRACTION W/PHACO Left 01/26/2017   Procedure: CATARACT EXTRACTION PHACO AND INTRAOCULAR LENS PLACEMENT (IOC);  Surgeon: Estill Cotta, MD;  Location: [facility] ORS;  Service: Ophthalmology;  Laterality: Left;  [place]   1:02.2 AP     23.7 CDE   28.92 fluid pack lot# [id number] H exp.05/08/2018  . CHOLECYSTECTOMY    .  ORIF FEMUR FRACTURE Left 11/17/2017   Procedure: OPEN REDUCTION INTERNAL FIXATION (ORIF) DISTAL FEMUR FRACTURE;  Surgeon: Shona Needles, MD;  Location: [place];  Service: Orthopedics;  Laterality: Left;  . TONSILLECTOMY      SOCIAL HISTORY: Social History   Socioeconomic History  . Marital status: Married    Spouse name: Not on file   . Number of children: Not on file  . Years of education: Not on file  . Highest education level: Not on file  Occupational History  . Not on file  Social Needs  . Financial resource strain: Not on file  . Food insecurity:    Worry: Not on file    Inability: Not on file  . Transportation needs:    Medical: Not on file    Non-medical: Not on file  Tobacco Use  . Smoking status: Former Smoker    Packs/day: 3.00    Types: Cigarettes    Last attempt to quit: 1988    Years since quitting: 31.4  . Smokeless tobacco: Never Used  Substance and Sexual Activity  . Alcohol use: No  . Drug use: No  . Sexual activity: Not on file  Lifestyle  . Physical activity:    Days per week: Not on file    Minutes per session: Not on file  . Stress: Not on file  Relationships  . Social connections:    Talks on phone: Not on file    Gets together: Not on file    Attends religious service: Not on file    Active member of club or organization: Not on file    Attends meetings of clubs or organizations: Not on file    Relationship status: Not on file  . Intimate partner violence:    Fear of current or ex partner: Not on file    Emotionally abused: Not on file    Physically abused: Not on file    Forced sexual activity: Not on file  Other Topics Concern  . Not on file  Social History Narrative  . Not on file    FAMILY HISTORY: Family History  Problem Relation Age of Onset  . COPD Mother   . Heart disease Mother   . Anemia Mother   . Heart disease Father   . COPD Father   . Anemia Sister   . Diabetes Maternal Grandmother   . Hypertension Paternal Grandfather     ALLERGIES:  is allergic to other; latex; exenatide; garlic; onion; rosiglitazone; and erythromycin.  MEDICATIONS:  Current Outpatient Medications  Medication Sig Dispense Refill  . albuterol (PROAIR HFA) 108 (90 Base) MCG/ACT inhaler inhale 2 puffs by mouth INTO LUNGS every 4 to 6 hours if needed for wheezing or shortness  of breath    . albuterol (PROVENTIL) (2.5 MG/3ML) 0.083% nebulizer solution Inhale 3 mLs (2.5 mg total) into the lungs every 4 (four) hours as needed for wheezing or shortness of breath. 75 mL 12  . Amino Acids-Protein Hydrolys (FEEDING SUPPLEMENT, PRO-STAT SUGAR FREE 64,) LIQD Take 30 mLs by mouth 3 (three) times daily with meals.    Marland Kitchen amiodarone (PACERONE) 200 MG tablet Take 200 mg by mouth daily.  0  . azelastine (ASTELIN) 0.1 % nasal spray Place 2 sprays into both nostrils 2 (two) times daily.  1  . Calcium Carbonate-Vitamin D3 (CALCIUM 600-D) 600-400 MG-UNIT TABS Take 1 tablet by mouth daily.    . Cholecalciferol (D3 HIGH POTENCY) 2000 units CAPS Take 4,000 Units by mouth daily.     Marland Kitchen  citalopram (CELEXA) 20 MG tablet Take 20 mg by mouth daily.    . cloNIDine (CATAPRES) 0.1 MG tablet Take 1 tablet (0.1 mg total) by mouth 2 (two) times daily. 60 tablet 5  . co-enzyme Q-10 50 MG capsule Take 100 mg by mouth daily.     . cyclobenzaprine (FLEXERIL) 5 MG tablet Take 5 mg by mouth 3 (three) times daily as needed for muscle spasms.    . Fluticasone-Salmeterol (ADVAIR) 250-50 MCG/DOSE AEPB Inhale 1 puff into the lungs 2 (two) times daily.    Marland Kitchen gabapentin (NEURONTIN) 100 MG capsule Take 100 mg by mouth 3 (three) times daily as needed (for nerve pain).     . Garlic 8841 MG CAPS Take 1,000 mg by mouth daily.     Marland Kitchen glimepiride (AMARYL) 2 MG tablet Take 2 mg by mouth daily with breakfast.    . HYDROcodone-acetaminophen (NORCO) 10-325 MG tablet Take 1 tablet by mouth every 6 (six) hours as needed.    . insulin aspart (NOVOLOG) 100 UNIT/ML injection Inject into the skin 3 (three) times daily before meals.    Marland Kitchen levothyroxine (SYNTHROID, LEVOTHROID) 175 MCG tablet Take 175 mcg by mouth every evening.     . loratadine (CLARITIN) 10 MG tablet Take 1 tablet (10 mg total) by mouth daily. 30 tablet 0  . lovastatin (MEVACOR) 20 MG tablet Take 20 mg by mouth at bedtime.    . magnesium oxide (MAG-OX) 400 MG tablet  Take 400 mg by mouth 2 (two) times daily.    . meclizine (ANTIVERT) 12.5 MG tablet Take 1 tablet (12.5 mg total) by mouth 3 (three) times daily as needed for dizziness. 30 tablet 0  . metolazone (ZAROXOLYN) 5 MG tablet Take 5 mg by mouth as needed (for a weight gain of 2 pounds overnight or 5 pounds in a week; MAX OF 5 TABLETS AT A TIME).     . Multiple Vitamins-Minerals (CENTRUM WOMEN) TABS Take 1 tablet by mouth daily.     . Omega-3 Fatty Acids (FISH OIL) 1000 MG CPDR Take 1 capsule by mouth daily.    . pantoprazole (PROTONIX) 40 MG tablet Take 40 mg by mouth 2 (two) times daily.    . polyethylene glycol (MIRALAX / GLYCOLAX) packet Take 17 g by mouth 2 (two) times daily. 14 each 0  . POTASSIUM PO Take 400 mg by mouth See admin instructions. Take 400 mg by mouth two times a day and take an extra 800 mg daily when taking Zaroxolyn    . senna-docusate (SENOKOT-S) 8.6-50 MG tablet Take 1 tablet by mouth at bedtime as needed for mild constipation. 30 tablet 0  . sodium phosphate (FLEET) 7-19 GM/118ML ENEM Place 133 mLs (1 enema total) rectally once as needed for severe constipation. 1 enema 0  . torsemide (DEMADEX) 20 MG tablet Take 2 tablets (40 mg total) by mouth 2 (two) times daily. 360 tablet 3  . vitamin C (ASCORBIC ACID) 500 MG tablet Take 500 mg by mouth daily.    Marland Kitchen warfarin (COUMADIN) 1 MG tablet Take 1 mg daily at 6 PM by mouth.     . warfarin (COUMADIN) 5 MG tablet Take 5 mg by mouth daily at 6 PM.   0  . zinc sulfate 220 (50 Zn) MG capsule Take 220 mg by mouth daily.     No current facility-administered medications for this visit.       Marland Kitchen  PHYSICAL EXAMINATION: ECOG PERFORMANCE STATUS: 2 - Symptomatic, <50% confined to bed  Vitals:   12/29/17 1014  BP: 127/76  Pulse: 61  Resp: 16  Temp: (!) 96.1 F (35.6 C)   Filed Weights   Physical Exam  Constitutional: She is oriented to person, place, and time and well-developed, well-nourished, and in no distress. No distress.  HENT:    Head: Normocephalic and atraumatic.  Nose: Nose normal.  Mouth/Throat: Oropharynx is clear and moist. No oropharyngeal exudate.  Eyes: Pupils are equal, round, and reactive to light. Conjunctivae and EOM are normal. Right eye exhibits no discharge. Left eye exhibits no discharge. No scleral icterus.  Neck: Normal range of motion. Neck supple. No JVD present.  Cardiovascular: Normal rate, regular rhythm and normal heart sounds.  No murmur heard. Pulmonary/Chest: Effort normal and breath sounds normal. No respiratory distress. She has no wheezes. She has no rales. She exhibits no tenderness.  Abdominal: Soft. Bowel sounds are normal. She exhibits no distension and no mass. There is no tenderness. There is no rebound.  Musculoskeletal: Normal range of motion. She exhibits edema. She exhibits no tenderness.  Chronic 1+ edema,  LLE s/p ORIF.   Lymphadenopathy:    She has no cervical adenopathy.  Neurological: She is alert and oriented to person, place, and time. No cranial nerve deficit. She exhibits normal muscle tone. Coordination normal.  Skin: Skin is warm and dry. No rash noted. She is not diaphoretic. No erythema.  Bilateral LE vein insufficiency skin changes.   Psychiatric: Affect and judgment normal.     LABORATORY DATA:  I have reviewed the data as listed CBC Latest Ref Rng & Units 12/27/2017 11/21/2017 11/20/2017  WBC 3.6 - 11.0 K/uL 8.7 13.1(H) 13.6(H)  Hemoglobin 12.0 - 16.0 g/dL 11.9(L) 8.9(L) 9.0(L)  Hematocrit 35.0 - 47.0 % 36.9 29.2(L) 28.5(L)  Platelets 150 - 440 K/uL 268 234 196   Recent Labs    11/19/17 0358 11/20/17 0543 11/21/17 0421 12/27/17 0945  NA 133* 138 138 137  K 3.9 3.8 3.8 3.2*  CL 90* 94* 94* 87*  CO2 33* 32 32 37*  GLUCOSE 286* 220* 234* 161*  BUN 52* 47* 42* 62*  CREATININE 1.71* 1.54* 1.32* 1.82*  CALCIUM 8.0* 8.5* 8.4* 9.0  GFRNONAA 31* 35* 42* 29*  GFRAA 36* 41* 49* 33*  PROT 6.4* 6.3* 6.5  --   ALBUMIN 2.5* 2.3* 2.3*  --   AST 22 19 23    --   ALT 6* 6* 8*  --   ALKPHOS 79 78 74  --   BILITOT 0.9 1.2 1.1  --      Labs obtained at the nephrologist office showed Peripheral blood Protein electrophoresis revealed no monoclonal spike. Urine protein electrophoresis negative for monoclonal spike. Hemoglobin 7.9, WBC 12.1, MCV 72, RDW 19, platelet 283,000, neutrophil 9.2, lymphocyte 1.6, monocyte 1.0, a ANA negative.  ASSESSMENT & PLAN:  1. Iron deficiency anemia due to chronic blood loss   2. CKD (chronic kidney disease) stage 3, GFR 30-59 ml/min (HCC)   3. Anemia of chronic kidney failure, stage 3 (moderate) (HCC)    Her anemia likely multifactorial from chronic kidney disease, as well as iron deficiency. Hemoglobin stable today, above 11. S/p PRBC transfusion recently. No need for procrit.  Iron panel reviewed,iron saturation boarderline at 11, will proceed with 1 dose of Venofer.   She is on warfarin. Previously discussed with patient for colonoscopy patient declined.  The patient knows to call the clinic with any problems questions or concerns.  Return of visit: 3 months with CBC,  TIBC, ferritin done one or 2 days prior to, possible Venofer.   Earlie Server, MD, PhD Hematology Oncology [facility] at [facility] Pager- [phone] 12/29/2017

## 2017-12-29 NOTE — Progress Notes (Signed)
Patient here today for follow up.   

## 2018-02-06 ENCOUNTER — Other Ambulatory Visit: Payer: Self-pay

## 2018-02-06 ENCOUNTER — Emergency Department
Admission: EM | Admit: 2018-02-06 | Discharge: 2018-02-06 | Disposition: A | Discharge status: Home / Self Care | Payer: Medicare Other | Attending: Emergency Medicine | Admitting: Emergency Medicine

## 2018-02-06 ENCOUNTER — Emergency Department: Payer: Medicare Other

## 2018-02-06 DIAGNOSIS — E1122 Type 2 diabetes mellitus with diabetic chronic kidney disease: Secondary | ICD-10-CM | POA: Diagnosis not present

## 2018-02-06 DIAGNOSIS — R0602 Shortness of breath: Secondary | ICD-10-CM | POA: Insufficient documentation

## 2018-02-06 DIAGNOSIS — Z87891 Personal history of nicotine dependence: Secondary | ICD-10-CM | POA: Diagnosis not present

## 2018-02-06 DIAGNOSIS — E1140 Type 2 diabetes mellitus with diabetic neuropathy, unspecified: Secondary | ICD-10-CM | POA: Diagnosis not present

## 2018-02-06 DIAGNOSIS — N189 Chronic kidney disease, unspecified: Secondary | ICD-10-CM | POA: Diagnosis not present

## 2018-02-06 DIAGNOSIS — F419 Anxiety disorder, unspecified: Secondary | ICD-10-CM | POA: Diagnosis present

## 2018-02-06 DIAGNOSIS — I130 Hypertensive heart and chronic kidney disease with heart failure and stage 1 through stage 4 chronic kidney disease, or unspecified chronic kidney disease: Secondary | ICD-10-CM | POA: Insufficient documentation

## 2018-02-06 DIAGNOSIS — Z91040 Latex allergy status: Secondary | ICD-10-CM | POA: Diagnosis not present

## 2018-02-06 DIAGNOSIS — I5032 Chronic diastolic (congestive) heart failure: Secondary | ICD-10-CM | POA: Insufficient documentation

## 2018-02-06 DIAGNOSIS — E039 Hypothyroidism, unspecified: Secondary | ICD-10-CM | POA: Diagnosis not present

## 2018-02-06 DIAGNOSIS — R0600 Dyspnea, unspecified: Secondary | ICD-10-CM

## 2018-02-06 LAB — CBC WITH DIFFERENTIAL/PLATELET
Basophils Absolute: 0 10*3/uL (ref 0–0.1)
Basophils Relative: 0 %
Eosinophils Absolute: 0 10*3/uL (ref 0–0.7)
Eosinophils Relative: 0 %
HEMATOCRIT: 38.9 % (ref 35.0–47.0)
HEMOGLOBIN: 12.5 g/dL (ref 12.0–16.0)
LYMPHS ABS: 0.6 10*3/uL — AB (ref 1.0–3.6)
Lymphocytes Relative: 5 %
MCH: 26.6 pg (ref 26.0–34.0)
MCHC: 32.2 g/dL (ref 32.0–36.0)
MCV: 82.4 fL (ref 80.0–100.0)
MONO ABS: 0.7 10*3/uL (ref 0.2–0.9)
Monocytes Relative: 6 %
NEUTROS ABS: 10 10*3/uL — AB (ref 1.4–6.5)
NEUTROS PCT: 89 %
Platelets: 225 10*3/uL (ref 150–440)
RBC: 4.72 MIL/uL (ref 3.80–5.20)
RDW: 17.7 % — AB (ref 11.5–14.5)
WBC: 11.3 10*3/uL — ABNORMAL HIGH (ref 3.6–11.0)

## 2018-02-06 LAB — BASIC METABOLIC PANEL
Anion gap: 12 (ref 5–15)
BUN: 43 mg/dL — AB (ref 8–23)
CHLORIDE: 93 mmol/L — AB (ref 98–111)
CO2: 35 mmol/L — AB (ref 22–32)
CREATININE: 1.79 mg/dL — AB (ref 0.44–1.00)
Calcium: 9.1 mg/dL (ref 8.9–10.3)
GFR calc Af Amer: 34 mL/min — ABNORMAL LOW (ref >60)
GFR calc non Af Amer: 29 mL/min — ABNORMAL LOW (ref >60)
GLUCOSE: 268 mg/dL — AB (ref 70–99)
POTASSIUM: 3.3 mmol/L — AB (ref 3.5–5.1)
SODIUM: 140 mmol/L (ref 135–145)

## 2018-02-06 LAB — BLOOD GAS, VENOUS
ACID-BASE EXCESS: 14.8 mmol/L — AB (ref 0.0–2.0)
BICARBONATE: 41.3 mmol/L — AB (ref 20.0–28.0)
O2 Saturation: 80.5 %
PATIENT TEMPERATURE: 37
PO2 VEN: 42 mmHg (ref 32.0–45.0)
pCO2, Ven: 58 mmHg (ref 44.0–60.0)
pH, Ven: 7.46 — ABNORMAL HIGH (ref 7.250–7.430)

## 2018-02-06 LAB — BRAIN NATRIURETIC PEPTIDE: B Natriuretic Peptide: 130 pg/mL — ABNORMAL HIGH (ref 0.0–100.0)

## 2018-02-06 LAB — TROPONIN I

## 2018-02-06 MED ORDER — DIAZEPAM 5 MG PO TABS
10.0000 mg | ORAL_TABLET | Freq: Once | ORAL | Status: AC
  Administered 2018-02-06: 10 mg via ORAL
  Filled 2018-02-06: qty 2

## 2018-02-06 MED ORDER — LORAZEPAM 1 MG PO TABS: 1.0000 mg | ORAL_TABLET | Freq: Two times a day (BID) | ORAL | 0 refills | Status: DC

## 2018-02-06 NOTE — ED Provider Notes (Signed)
[facility] Emergency Department Provider Note       Time seen: ----------------------------------------- 10:26 AM on 02/06/2018 -----------------------------------------   I have reviewed the triage vital signs and the nursing notes.  HISTORY   Chief Complaint Anxiety and Shortness of Breath    HPI Andrea Rivers is a 62 y.o. female with a history of anemia, anxiety, CHF, chronic kidney disease, COPD, A. fib, edema who presents to the ED for anxiety and shortness of breath.  Patient is currently in a skilled nursing facility for rehab due to left knee surgery in April.  She was discharged home yesterday.  Patient reports that she has anxiety and makes it feel like she cannot breathe from time to time.  In the nursing home when she was short of breath they would place nasal cannula oxygen on her which would resolve her symptoms after a few hours.  She does take citalopram but does not take any benzodiazepines.  Past Medical History:  Diagnosis Date  . Anemia   . Anxiety   . CHF (congestive heart failure) ([place])   . Chronic kidney disease    INSUFFICIENCY  . Complication of anesthesia    had to be intubated during cataract surgery   " I could not breath "  . COPD (chronic obstructive pulmonary disease) ([place])   . Diabetes mellitus without complication ([place])   . Dyspnea    DOE  . Dysrhythmia    A FIB  . Edema   . Fracture of distal femur ([place]) 11/2017   left   . GERD (gastroesophageal reflux disease)   . History of kidney stones   . Hypertension   . Hypothyroidism    ABLATION  . Iron deficiency anemia 03/31/2017  . Neuropathy   . Orthopnea   . Sleep apnea    CPAP  . Vertigo   . Wheezing     Patient Active Problem List   Diagnosis Date Noted  . Closed displaced supracondylar fracture of distal end of left femur with intracondylar extension ([place]) 11/17/2017  . CKD (chronic kidney disease) 11/14/2017  . Hypothyroidism 11/14/2017  . Atrial  fibrillation ([place]) 11/14/2017  . Anxiety state 11/14/2017  . Peripheral neuropathy 11/14/2017  . Fracture 11/14/2017  . Bradycardia 08/13/2017  . Microcytic anemia 03/31/2017  . Iron deficiency anemia 03/31/2017  . Chronic diastolic heart failure ([place]) 03/10/2017  . HTN (hypertension) 03/10/2017  . Diabetes ([place]) 03/10/2017  . Obstructive sleep apnea 03/10/2017  . Shock [facility])     Past Surgical History:  Procedure Laterality Date  . CATARACT EXTRACTION W/PHACO Left 01/26/2017   Procedure: CATARACT EXTRACTION PHACO AND INTRAOCULAR LENS PLACEMENT (IOC);  Surgeon: Estill Cotta, MD;  Location: [facility] ORS;  Service: Ophthalmology;  Laterality: Left;  [place]   1:02.2 AP     23.7 CDE   28.92 fluid pack lot# [id number] H exp.05/08/2018  . CHOLECYSTECTOMY    . ORIF FEMUR FRACTURE Left 11/17/2017   Procedure: OPEN REDUCTION INTERNAL FIXATION (ORIF) DISTAL FEMUR FRACTURE;  Surgeon: Shona Needles, MD;  Location: [place];  Service: Orthopedics;  Laterality: Left;  . TONSILLECTOMY      Allergies Other; Latex; Exenatide; Garlic; Onion; Rosiglitazone; and Erythromycin  Social History Social History   Tobacco Use  . Smoking status: Former Smoker    Packs/day: 3.00    Types: Cigarettes    Last attempt to quit: 1988    Years since quitting: 31.5  . Smokeless tobacco: Never Used  Substance Use Topics  . Alcohol  use: No  . Drug use: No   Review of Systems Constitutional: Negative for fever. Cardiovascular: Negative for chest pain. Respiratory: Positive for shortness of breath Gastrointestinal: Negative for abdominal pain, vomiting and diarrhea. Genitourinary: Negative for dysuria. Musculoskeletal: Negative for back pain. Skin: Negative for rash. Neurological: Negative for headaches, focal weakness or numbness. Psychiatric: Positive for anxiety  All systems negative/normal/unremarkable except as stated in the HPI  ____________________________________________   PHYSICAL EXAM:  VITAL  SIGNS: ED Triage Vitals [02/06/18 1017]  Enc Vitals Group     BP (!) 129/49     Pulse Rate 71     Resp (!) 22     Temp 98 F (36.7 C)     Temp Source Oral     SpO2 97 %     Weight 298 lb (135.2 kg)     Height 5\' 5"  (1.651 m)     Head Circumference      Peak Flow      Pain Score 5     Pain Loc      Pain Edu?      Excl. in [place]?    Constitutional: Alert and oriented.  Anxious, no distress Eyes: Conjunctivae are normal. Normal extraocular movements. ENT   Head: Normocephalic and atraumatic.   Nose: No congestion/rhinnorhea.   Mouth/Throat: Mucous membranes are moist.   Neck: No stridor. Cardiovascular: Normal rate, regular rhythm. No murmurs, rubs, or gallops. Respiratory: Normal respiratory effort without tachypnea nor retractions. Breath sounds are clear and equal bilaterally. No wheezes/rales/rhonchi. Gastrointestinal: Soft and nontender. Normal bowel sounds Musculoskeletal: Nontender with normal range of motion in extremities.  Chronic appearing bilateral lower extremity edema, postsurgical site in the left knee laterally appears normal, no signs of infection Neurologic:  Normal speech and language. No gross focal neurologic deficits are appreciated.  Skin:  Skin is warm, dry and intact. No rash noted. Psychiatric: Mood and affect are normal. Speech and behavior are normal.  ____________________________________________  EKG: Interpreted by me.  Sinus rhythm rate 64 bpm, prolonged QT, likely old inferior infarct  ____________________________________________  ED COURSE:  As part of my medical decision making, I reviewed the following data within the [place] History obtained from family if available, nursing notes, old chart and ekg, as well as notes from prior ED visits. Patient presented for shortness of breath and anxiety, we will assess with labs and imaging as indicated at this time.    Procedures ____________________________________________   LABS (pertinent positives/negatives)  Labs Reviewed  CBC WITH DIFFERENTIAL/PLATELET - Abnormal; Notable for the following components:      Result Value   WBC 11.3 (*)    RDW 17.7 (*)    Neutro Abs 10.0 (*)    Lymphs Abs 0.6 (*)    All other components within normal limits  BRAIN NATRIURETIC PEPTIDE - Abnormal; Notable for the following components:   B Natriuretic Peptide 130.0 (*)    All other components within normal limits  BASIC METABOLIC PANEL - Abnormal; Notable for the following components:   Potassium 3.3 (*)    Chloride 93 (*)    CO2 35 (*)    Glucose, Bld 268 (*)    BUN 43 (*)    Creatinine, Ser 1.79 (*)    GFR calc non Af Amer 29 (*)    GFR calc Af Amer 34 (*)    All other components within normal limits  BLOOD GAS, VENOUS - Abnormal; Notable for the following components:   pH, Ven  7.46 (*)    Bicarbonate 41.3 (*)    Acid-Base Excess 14.8 (*)    All other components within normal limits  TROPONIN I    RADIOLOGY Images were viewed by me  Chest x-ray IMPRESSION: Cardiomegaly with mild pulmonary vascular congestion.  ____________________________________________  DIFFERENTIAL DIAGNOSIS   Anxiety, pneumonia, PE, anemia  FINAL ASSESSMENT AND PLAN  Anxiety, dyspnea   Plan: The patient had presented for anxiety and dyspnea. Patient's labs were within normal limits for her, have not significantly changed from prior. Patient's imaging revealed cardiomegaly with mild pulmonary vascular congestion.  She currently is on a diuretic for same.  I will discharge her with medications for anxiety, she is cleared for outpatient follow-up.   Laurence Aly, MD   Note: This note was generated in part or whole with voice recognition software. Voice recognition is usually quite accurate but there are transcription errors that can and very often do occur. I apologize for any typographical errors that were  not detected and corrected.     Earleen Newport, MD 02/06/18 1300

## 2018-02-06 NOTE — ED Notes (Signed)
Patient transported to X-ray 

## 2018-02-06 NOTE — ED Triage Notes (Signed)
Pt arrived via ems for anxiety and shortness of breath - she had been in nursing home for rehab r/t left knee surgery from April 17th and was discharged home yesterday - she reports that she has anxiety and that it makes her feel like she "can't breath" -wshile at the nursing home they would put her on oxygen to relieve the symptoms

## 2018-02-06 NOTE — ED Notes (Signed)
PT up to the bathroom using a walker. PT did not need staff assistance and denied SOB. NAD noted while ambulating.

## 2018-03-01 ENCOUNTER — Ambulatory Visit: Payer: Self-pay | Admitting: Podiatry

## 2018-03-22 ENCOUNTER — Other Ambulatory Visit: Payer: Self-pay | Admitting: Oncology

## 2018-03-22 DIAGNOSIS — D509 Iron deficiency anemia, unspecified: Secondary | ICD-10-CM

## 2018-03-27 ENCOUNTER — Inpatient Hospital Stay: Payer: Medicare Other | Attending: Oncology

## 2018-03-27 DIAGNOSIS — N179 Acute kidney failure, unspecified: Secondary | ICD-10-CM | POA: Insufficient documentation

## 2018-03-27 DIAGNOSIS — N183 Chronic kidney disease, stage 3 (moderate): Secondary | ICD-10-CM | POA: Insufficient documentation

## 2018-03-27 DIAGNOSIS — I5032 Chronic diastolic (congestive) heart failure: Secondary | ICD-10-CM | POA: Insufficient documentation

## 2018-03-27 DIAGNOSIS — D509 Iron deficiency anemia, unspecified: Secondary | ICD-10-CM | POA: Insufficient documentation

## 2018-03-27 DIAGNOSIS — I4891 Unspecified atrial fibrillation: Secondary | ICD-10-CM | POA: Diagnosis not present

## 2018-03-27 DIAGNOSIS — Z7901 Long term (current) use of anticoagulants: Secondary | ICD-10-CM | POA: Diagnosis not present

## 2018-03-27 DIAGNOSIS — I130 Hypertensive heart and chronic kidney disease with heart failure and stage 1 through stage 4 chronic kidney disease, or unspecified chronic kidney disease: Secondary | ICD-10-CM | POA: Diagnosis not present

## 2018-03-27 LAB — FERRITIN: FERRITIN: 65 ng/mL (ref 11–307)

## 2018-03-27 LAB — CBC WITH DIFFERENTIAL/PLATELET
BASOS ABS: 0.1 10*3/uL (ref 0–0.1)
Basophils Relative: 1 %
Eosinophils Absolute: 0.2 10*3/uL (ref 0–0.7)
Eosinophils Relative: 2 %
HEMATOCRIT: 36.6 % (ref 35.0–47.0)
HEMOGLOBIN: 11.5 g/dL — AB (ref 12.0–16.0)
LYMPHS PCT: 11 %
Lymphs Abs: 1.2 10*3/uL (ref 1.0–3.6)
MCH: 25.7 pg — ABNORMAL LOW (ref 26.0–34.0)
MCHC: 31.6 g/dL — ABNORMAL LOW (ref 32.0–36.0)
MCV: 81.5 fL (ref 80.0–100.0)
MONO ABS: 1 10*3/uL — AB (ref 0.2–0.9)
Monocytes Relative: 10 %
NEUTROS ABS: 8.4 10*3/uL — AB (ref 1.4–6.5)
Neutrophils Relative %: 76 %
Platelets: 230 10*3/uL (ref 150–440)
RBC: 4.49 MIL/uL (ref 3.80–5.20)
RDW: 19.3 % — ABNORMAL HIGH (ref 11.5–14.5)
WBC: 10.9 10*3/uL (ref 3.6–11.0)

## 2018-03-27 LAB — IRON AND TIBC
IRON: 34 ug/dL (ref 28–170)
Saturation Ratios: 12 % (ref 10.4–31.8)
TIBC: 280 ug/dL (ref 250–450)
UIBC: 246 ug/dL

## 2018-03-30 ENCOUNTER — Inpatient Hospital Stay (HOSPITAL_BASED_OUTPATIENT_CLINIC_OR_DEPARTMENT_OTHER): Payer: Medicare Other | Admitting: Oncology

## 2018-03-30 ENCOUNTER — Other Ambulatory Visit: Payer: Self-pay

## 2018-03-30 ENCOUNTER — Encounter: Payer: Self-pay | Admitting: Oncology

## 2018-03-30 ENCOUNTER — Inpatient Hospital Stay: Payer: Medicare Other

## 2018-03-30 VITALS — BP 151/64 | HR 69 | Resp 18

## 2018-03-30 VITALS — BP 162/66 | HR 65 | Temp 96.7°F | Resp 18 | Wt 314.4 lb

## 2018-03-30 DIAGNOSIS — D509 Iron deficiency anemia, unspecified: Secondary | ICD-10-CM

## 2018-03-30 DIAGNOSIS — N183 Chronic kidney disease, stage 3 unspecified: Secondary | ICD-10-CM

## 2018-03-30 DIAGNOSIS — I5032 Chronic diastolic (congestive) heart failure: Secondary | ICD-10-CM | POA: Diagnosis not present

## 2018-03-30 DIAGNOSIS — I4891 Unspecified atrial fibrillation: Secondary | ICD-10-CM

## 2018-03-30 DIAGNOSIS — N179 Acute kidney failure, unspecified: Secondary | ICD-10-CM | POA: Diagnosis not present

## 2018-03-30 DIAGNOSIS — Z7901 Long term (current) use of anticoagulants: Secondary | ICD-10-CM

## 2018-03-30 DIAGNOSIS — D500 Iron deficiency anemia secondary to blood loss (chronic): Secondary | ICD-10-CM

## 2018-03-30 DIAGNOSIS — I130 Hypertensive heart and chronic kidney disease with heart failure and stage 1 through stage 4 chronic kidney disease, or unspecified chronic kidney disease: Secondary | ICD-10-CM

## 2018-03-30 MED ORDER — IRON SUCROSE 20 MG/ML IV SOLN
200.0000 mg | Freq: Once | INTRAVENOUS | Status: AC
  Administered 2018-03-30: 200 mg via INTRAVENOUS
  Filled 2018-03-30: qty 10

## 2018-03-30 MED ORDER — SODIUM CHLORIDE 0.9 % IV SOLN
Freq: Once | INTRAVENOUS | Status: AC
  Administered 2018-03-30: 10:00:00 via INTRAVENOUS
  Filled 2018-03-30: qty 250

## 2018-03-30 MED ORDER — SODIUM CHLORIDE 0.9 % IV SOLN: 200.0000 mg | Freq: Once | INTRAVENOUS | Status: DC

## 2018-03-30 NOTE — Progress Notes (Signed)
Patient here for follow up

## 2018-03-30 NOTE — Progress Notes (Signed)
Hematology/Oncology Follow up visit [facility] [phone] [phone]   Patient Care Team: Sofie Hartigan, MD as PCP - General (Family Medicine) Alisa Graff, FNP as Nurse Practitioner (Family Medicine) Ubaldo Glassing Javier Docker, MD as Consulting Physician (Cardiology) Elease Etienne, MD as Consulting Physician (Endocrinology)  CHIEF COMPLAINTS/REASON FOR VISIT Follow up for treatment of Anemia    HISTORY OF PRESENTING ILLNESS:  Andrea Rivers 62 y.o.  female with past medical history listed as below who was referred by Dr. Holley Raring to me for evaluation and management of anemia.  Patient was recently hospitalized due to acute respiratory failure secondary to diastolic CHF exacerbation. She was initially admitted to ICU, intubated and required mechanical ventilation. She also had acute on chronic kidney failure. Her recent labs showed microcytic anemia with hemoglobin  6.5, MCV 67.9. She received blood transfusion on 02/24/2017. Hemoglobin improved to 8.6. Patient reports fatigue, and a lack of energy. She has some lower extremity edema. She lives at home by herself and is able to do her ADLs. She takes warfarin for A. Fib. Denies any bleeding events, blood in the stool or black stool.  #  left distal femur comminuted fracture after mechanical fall, s/p ORIF done 11/17/17. Hemoglobin dropped to 7.7, s/p 2 units of PRBC transfusion.   INTERVAL HISTORY  Patient presents for follow up for management of anemia. Reports chronic anemia. Otherwise doing well. No blood in stool.  She is getting physical therapy treatments.   Review of Systems  Constitutional: Positive for fatigue. Negative for appetite change and chills.  HENT:   Negative for hearing loss and lump/mass.   Eyes: Negative for eye problems.  Respiratory: Negative for chest tightness, cough and shortness of breath.   Cardiovascular: Negative for chest pain and leg swelling.    Gastrointestinal: Negative for abdominal distention, abdominal pain, constipation, diarrhea and nausea.  Endocrine: Negative for hot flashes.  Genitourinary: Negative for difficulty urinating, dysuria, frequency and hematuria.   Musculoskeletal: Negative for arthralgias, back pain, gait problem and neck pain.  Skin: Negative for itching and rash.  Neurological: Negative for dizziness, gait problem and headaches.  Hematological: Negative for adenopathy. Does not bruise/bleed easily.  Psychiatric/Behavioral: Negative for confusion. The patient is not nervous/anxious.    MEDICAL HISTORY:  Past Medical History:  Diagnosis Date  . Anemia   . Anxiety   . CHF (congestive heart failure) ([place])   . Chronic kidney disease    INSUFFICIENCY  . Complication of anesthesia    had to be intubated during cataract surgery   " I could not breath "  . COPD (chronic obstructive pulmonary disease) ([place])   . Diabetes mellitus without complication ([place])   . Dyspnea    DOE  . Dysrhythmia    A FIB  . Edema   . Fracture of distal femur ([place]) 11/2017   left   . GERD (gastroesophageal reflux disease)   . History of kidney stones   . Hypertension   . Hypothyroidism    ABLATION  . Iron deficiency anemia 03/31/2017  . Neuropathy   . Orthopnea   . Sleep apnea    CPAP  . Vertigo   . Wheezing     SURGICAL HISTORY: Past Surgical History:  Procedure Laterality Date  . CATARACT EXTRACTION W/PHACO Left 01/26/2017   Procedure: CATARACT EXTRACTION PHACO AND INTRAOCULAR LENS PLACEMENT (IOC);  Surgeon: Estill Cotta, MD;  Location: [facility] ORS;  Service: Ophthalmology;  Laterality: Left;  [place]   1:02.2  AP     23.7 CDE   28.92 fluid pack lot# [id number] H exp.05/08/2018  . CHOLECYSTECTOMY    . ORIF FEMUR FRACTURE Left 11/17/2017   Procedure: OPEN REDUCTION INTERNAL FIXATION (ORIF) DISTAL FEMUR FRACTURE;  Surgeon: Shona Needles, MD;  Location: [facility];  Service: Orthopedics;  Laterality: Left;  . TONSILLECTOMY       SOCIAL HISTORY: Social History   Socioeconomic History  . Marital status: Married    Spouse name: Not on file  . Number of children: Not on file  . Years of education: Not on file  . Highest education level: Not on file  Occupational History  . Not on file  Social Needs  . Financial resource strain: Not on file  . Food insecurity:    Worry: Not on file    Inability: Not on file  . Transportation needs:    Medical: Not on file    Non-medical: Not on file  Tobacco Use  . Smoking status: Former Smoker    Packs/day: 3.00    Types: Cigarettes    Last attempt to quit: 1988    Years since quitting: 31.6  . Smokeless tobacco: Never Used  Substance and Sexual Activity  . Alcohol use: No  . Drug use: No  . Sexual activity: Not on file  Lifestyle  . Physical activity:    Days per week: Not on file    Minutes per session: Not on file  . Stress: Not on file  Relationships  . Social connections:    Talks on phone: Not on file    Gets together: Not on file    Attends religious service: Not on file    Active member of club or organization: Not on file    Attends meetings of clubs or organizations: Not on file    Relationship status: Not on file  . Intimate partner violence:    Fear of current or ex partner: Not on file    Emotionally abused: Not on file    Physically abused: Not on file    Forced sexual activity: Not on file  Other Topics Concern  . Not on file  Social History Narrative  . Not on file    FAMILY HISTORY: Family History  Problem Relation Age of Onset  . COPD Mother   . Heart disease Mother   . Anemia Mother   . Heart disease Father   . COPD Father   . Anemia Sister   . Diabetes Maternal Grandmother   . Hypertension Paternal Grandfather     ALLERGIES:  is allergic to other; latex; exenatide; garlic; onion; rosiglitazone; and erythromycin.  MEDICATIONS:  Current Outpatient Medications  Medication Sig Dispense Refill  . albuterol (PROAIR HFA)  108 (90 Base) MCG/ACT inhaler inhale 2 puffs by mouth INTO LUNGS every 4 to 6 hours if needed for wheezing or shortness of breath    . amiodarone (PACERONE) 200 MG tablet Take 200 mg by mouth daily.  0  . azelastine (ASTELIN) 0.1 % nasal spray Place 2 sprays into both nostrils 2 (two) times daily.  1  . Calcium Carbonate-Vitamin D3 (CALCIUM 600-D) 600-400 MG-UNIT TABS Take 1 tablet by mouth daily.    . Cholecalciferol (D3 HIGH POTENCY) 2000 units CAPS Take 4,000 Units by mouth daily.     . citalopram (CELEXA) 20 MG tablet Take 40 mg by mouth daily.     . cloNIDine (CATAPRES) 0.1 MG tablet Take 1 tablet (0.1 mg total) by mouth 2 (two)  times daily. 60 tablet 5  . co-enzyme Q-10 50 MG capsule Take 100 mg by mouth daily.     . cyclobenzaprine (FLEXERIL) 5 MG tablet Take 5 mg by mouth 3 (three) times daily as needed for muscle spasms.    Marland Kitchen gabapentin (NEURONTIN) 100 MG capsule Take 100 mg by mouth 3 (three) times daily as needed (for nerve pain).     . Garlic 6644 MG CAPS Take 1,000 mg by mouth daily.     Marland Kitchen glimepiride (AMARYL) 2 MG tablet Take 2 mg by mouth daily with breakfast.    . HYDROcodone-acetaminophen (NORCO) 10-325 MG tablet Take 1 tablet by mouth every 6 (six) hours as needed.    . Insulin Detemir (LEVEMIR FLEXTOUCH) 100 UNIT/ML Pen Inject into the skin.    Marland Kitchen insulin lispro (HUMALOG) 100 UNIT/ML injection Inject into the skin 3 (three) times daily before meals.    Marland Kitchen levothyroxine (SYNTHROID, LEVOTHROID) 175 MCG tablet Take 175 mcg by mouth every evening.     . linagliptin (TRADJENTA) 5 MG TABS tablet take 1 tablet by mouth once daily    . loratadine (CLARITIN) 10 MG tablet Take 1 tablet (10 mg total) by mouth daily. 30 tablet 0  . LORazepam (ATIVAN) 1 MG tablet Take 1 tablet (1 mg total) by mouth 2 (two) times daily. 20 tablet 0  . lovastatin (MEVACOR) 20 MG tablet Take 20 mg by mouth at bedtime.    . magnesium oxide (MAG-OX) 400 MG tablet Take 400 mg by mouth 2 (two) times daily.    .  meclizine (ANTIVERT) 12.5 MG tablet Take 1 tablet (12.5 mg total) by mouth 3 (three) times daily as needed for dizziness. 30 tablet 0  . metolazone (ZAROXOLYN) 5 MG tablet Take 5 mg by mouth as needed (for a weight gain of 2 pounds overnight or 5 pounds in a week; MAX OF 5 TABLETS AT A TIME).     . Multiple Vitamins-Minerals (CENTRUM WOMEN) TABS Take 1 tablet by mouth daily.     . Omega-3 Fatty Acids (FISH OIL) 1000 MG CPDR Take 1 capsule by mouth daily.    . pantoprazole (PROTONIX) 40 MG tablet Take 40 mg by mouth 2 (two) times daily.    Marland Kitchen POTASSIUM PO Take 400 mg by mouth See admin instructions. Take 400 mg by mouth two times a day and take an extra 800 mg daily when taking Zaroxolyn    . torsemide (DEMADEX) 20 MG tablet Take 2 tablets (40 mg total) by mouth 2 (two) times daily. 360 tablet 3  . vitamin C (ASCORBIC ACID) 500 MG tablet Take 500 mg by mouth daily.    Marland Kitchen warfarin (COUMADIN) 1 MG tablet Take 1 mg daily at 6 PM by mouth.     . warfarin (COUMADIN) 5 MG tablet Take 5 mg by mouth daily at 6 PM.   0  . albuterol (PROVENTIL) (2.5 MG/3ML) 0.083% nebulizer solution Inhale 3 mLs (2.5 mg total) into the lungs every 4 (four) hours as needed for wheezing or shortness of breath. (Patient not taking: Reported on 03/30/2018) 75 mL 12  . Amino Acids-Protein Hydrolys (FEEDING SUPPLEMENT, PRO-STAT SUGAR FREE 64,) LIQD Take 30 mLs by mouth 3 (three) times daily with meals.    . Fluticasone-Salmeterol (ADVAIR) 250-50 MCG/DOSE AEPB Inhale 1 puff into the lungs 2 (two) times daily.    . polyethylene glycol (MIRALAX / GLYCOLAX) packet Take 17 g by mouth 2 (two) times daily. (Patient not taking: Reported on 03/30/2018) 14  each 0  . senna-docusate (SENOKOT-S) 8.6-50 MG tablet Take 1 tablet by mouth at bedtime as needed for mild constipation. (Patient not taking: Reported on 03/30/2018) 30 tablet 0  . sodium phosphate (FLEET) 7-19 GM/118ML ENEM Place 133 mLs (1 enema total) rectally once as needed for severe  constipation. (Patient not taking: Reported on 03/30/2018) 1 enema 0  . zinc sulfate 220 (50 Zn) MG capsule Take 220 mg by mouth daily.     No current facility-administered medications for this visit.       Marland Kitchen  PHYSICAL EXAMINATION: ECOG PERFORMANCE STATUS: 2 - Symptomatic, <50% confined to bed Vitals:   03/30/18 0925  BP: (!) 162/66  Pulse: 65  Resp: 18  Temp: (!) 96.7 F (35.9 C)   Filed Weights   03/30/18 0925  Weight: (!) 314 lb 6 oz (142.6 kg)   Physical Exam  Constitutional: She is oriented to person, place, and time. No distress.  Walks with walker, morbidly obese.   HENT:  Head: Normocephalic and atraumatic.  Nose: Nose normal.  Mouth/Throat: Oropharynx is clear and moist. No oropharyngeal exudate.  Eyes: Pupils are equal, round, and reactive to light. Conjunctivae and EOM are normal. Right eye exhibits no discharge. Left eye exhibits no discharge. No scleral icterus.  Neck: Normal range of motion. Neck supple. No JVD present.  Cardiovascular: Normal rate, regular rhythm and normal heart sounds.  No murmur heard. Pulmonary/Chest: Effort normal and breath sounds normal. No respiratory distress. She has no wheezes. She has no rales. She exhibits no tenderness.  Abdominal: Soft. Bowel sounds are normal. She exhibits no distension and no mass. There is no tenderness. There is no rebound.  Musculoskeletal: Normal range of motion. She exhibits no tenderness.  Chronic 1+ edema,    Lymphadenopathy:    She has no cervical adenopathy.  Neurological: She is alert and oriented to person, place, and time. No cranial nerve deficit. She exhibits normal muscle tone. Coordination normal.  Skin: Skin is warm and dry. No rash noted. She is not diaphoretic. No erythema.  Bilateral LE vein insufficiency skin changes.   Psychiatric: Affect and judgment normal.     LABORATORY DATA:  I have reviewed the data as listed CBC Latest Ref Rng & Units 03/27/2018 02/06/2018 12/27/2017  WBC 3.6 -  11.0 K/uL 10.9 11.3(H) 8.7  Hemoglobin 12.0 - 16.0 g/dL 11.5(L) 12.5 11.9(L)  Hematocrit 35.0 - 47.0 % 36.6 38.9 36.9  Platelets 150 - 440 K/uL 230 225 268   Recent Labs    11/19/17 0358 11/20/17 0543 11/21/17 0421 12/27/17 0945 02/06/18 1113  NA 133* 138 138 137 140  K 3.9 3.8 3.8 3.2* 3.3*  CL 90* 94* 94* 87* 93*  CO2 33* 32 32 37* 35*  GLUCOSE 286* 220* 234* 161* 268*  BUN 52* 47* 42* 62* 43*  CREATININE 1.71* 1.54* 1.32* 1.82* 1.79*  CALCIUM 8.0* 8.5* 8.4* 9.0 9.1  GFRNONAA 31* 35* 42* 29* 29*  GFRAA 36* 41* 49* 33* 34*  PROT 6.4* 6.3* 6.5  --   --   ALBUMIN 2.5* 2.3* 2.3*  --   --   AST 22 19 23   --   --   ALT 6* 6* 8*  --   --   ALKPHOS 79 78 74  --   --   BILITOT 0.9 1.2 1.1  --   --      Labs obtained at the nephrologist office showed Peripheral blood Protein electrophoresis revealed no monoclonal spike. Urine protein  electrophoresis negative for monoclonal spike. Hemoglobin 7.9, WBC 12.1, MCV 72, RDW 19, platelet 283,000, neutrophil 9.2, lymphocyte 1.6, monocyte 1.0, a ANA negative.  ASSESSMENT & PLAN:  1. Iron deficiency anemia due to chronic blood loss   2. CKD (chronic kidney disease) stage 3, GFR 30-59 ml/min (HCC)    # Labs reviewed. Ferritin and hemoglobin slightly decreased.  Give that she has CKD, on anticoagulation with possible chronic GI blood loss, would recommend one dose of IV venofer to further increase her iron store.  Pt previously denied GI referral for colonoscopy.   The patient knows to call the clinic with any problems questions or concerns. Orders Placed This Encounter  Procedures  . CBC with Differential/Platelet    Standing Status:   Future    Standing Expiration Date:   03/31/2019  . Iron and TIBC    Standing Status:   Future    Standing Expiration Date:   03/31/2019  . Ferritin    Standing Status:   Future    Standing Expiration Date:   03/31/2019    Return of visit: 3 months.  with CBC, TIBC, ferritin done one or 2 days prior  to, possible Venofer.  Total face to face encounter time for this patient visit was 15 min. >50% of the time was  spent in counseling and coordination of care.  Earlie Server, MD, PhD Hematology Oncology [facility] at [facility] Pager- [phone] 03/30/2018

## 2018-03-30 NOTE — Patient Instructions (Signed)
Iron Sucrose injection What is this medicine? IRON SUCROSE (AHY ern SOO krohs) is an iron complex. Iron is used to make healthy red blood cells, which carry oxygen and nutrients throughout the body. This medicine is used to treat iron deficiency anemia in people with chronic kidney disease. This medicine may be used for other purposes; ask your health care provider or pharmacist if you have questions. COMMON BRAND NAME(S): Venofer What should I tell my health care provider before I take this medicine? They need to know if you have any of these conditions: -anemia not caused by low iron levels -heart disease -high levels of iron in the blood -kidney disease -liver disease -an unusual or allergic reaction to iron, other medicines, foods, dyes, or preservatives -pregnant or trying to get pregnant -breast-feeding How should I use this medicine? This medicine is for infusion into a vein. It is given by a health care professional in a hospital or clinic setting. Talk to your pediatrician regarding the use of this medicine in children. While this drug may be prescribed for children as young as 2 years for selected conditions, precautions do apply. Overdosage: If you think you have taken too much of this medicine contact a poison control center or emergency room at once. NOTE: This medicine is only for you. Do not share this medicine with others. What if I miss a dose? It is important not to miss your dose. Call your doctor or health care professional if you are unable to keep an appointment. What may interact with this medicine? Do not take this medicine with any of the following medications: -deferoxamine -dimercaprol -other iron products This medicine may also interact with the following medications: -chloramphenicol -deferasirox This list may not describe all possible interactions. Give your health care provider a list of all the medicines, herbs, non-prescription drugs, or dietary  supplements you use. Also tell them if you smoke, drink alcohol, or use illegal drugs. Some items may interact with your medicine. What should I watch for while using this medicine? Visit your doctor or healthcare professional regularly. Tell your doctor or healthcare professional if your symptoms do not start to get better or if they get worse. You may need blood work done while you are taking this medicine. You may need to follow a special diet. Talk to your doctor. Foods that contain iron include: whole grains/cereals, dried fruits, beans, or peas, leafy green vegetables, and organ meats (liver, kidney). What side effects may I notice from receiving this medicine? Side effects that you should report to your doctor or health care professional as soon as possible: -allergic reactions like skin rash, itching or hives, swelling of the face, lips, or tongue -breathing problems -changes in blood pressure -cough -fast, irregular heartbeat -feeling faint or lightheaded, falls -fever or chills -flushing, sweating, or hot feelings -joint or muscle aches/pains -seizures -swelling of the ankles or feet -unusually weak or tired Side effects that usually do not require medical attention (report to your doctor or health care professional if they continue or are bothersome): -diarrhea -feeling achy -headache -irritation at site where injected -nausea, vomiting -stomach upset -tiredness This list may not describe all possible side effects. Call your doctor for medical advice about side effects. You may report side effects to FDA at 1-800-FDA-1088. Where should I keep my medicine? This drug is given in a hospital or clinic and will not be stored at home. NOTE: This sheet is a summary. It may not cover all possible information. If   you have questions about this medicine, talk to your doctor, pharmacist, or health care provider.  2018 Elsevier/Gold Standard (2011-05-06 17:14:35)  

## 2018-05-08 IMAGING — CR DG CHEST 2V
2 series · 2 of 2 positions shown · non-contrast
Comparison: 03/02/2017

CLINICAL DATA: Shortness of breath.

EXAM:
CHEST  2 VIEW

[chest pa]
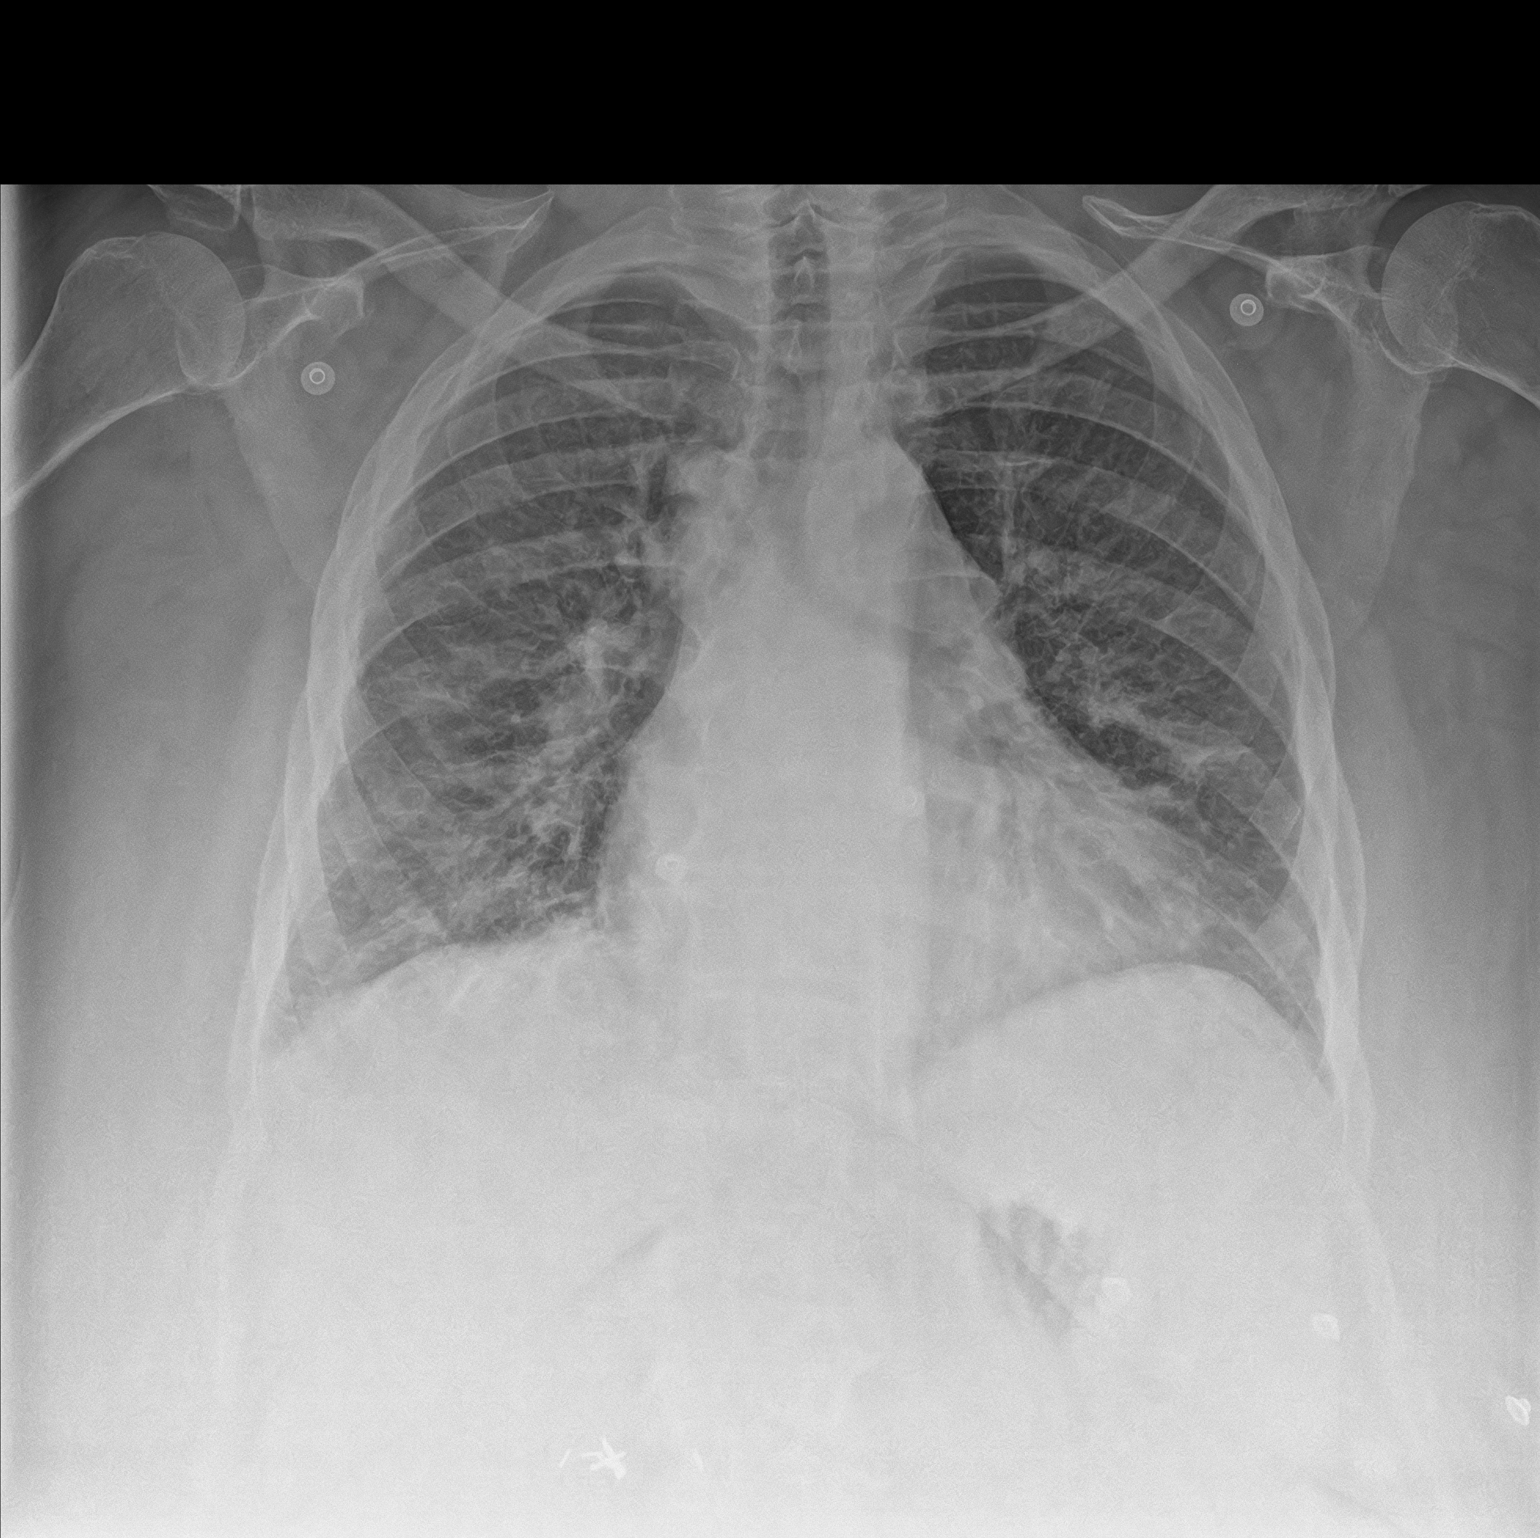

[chest lat]
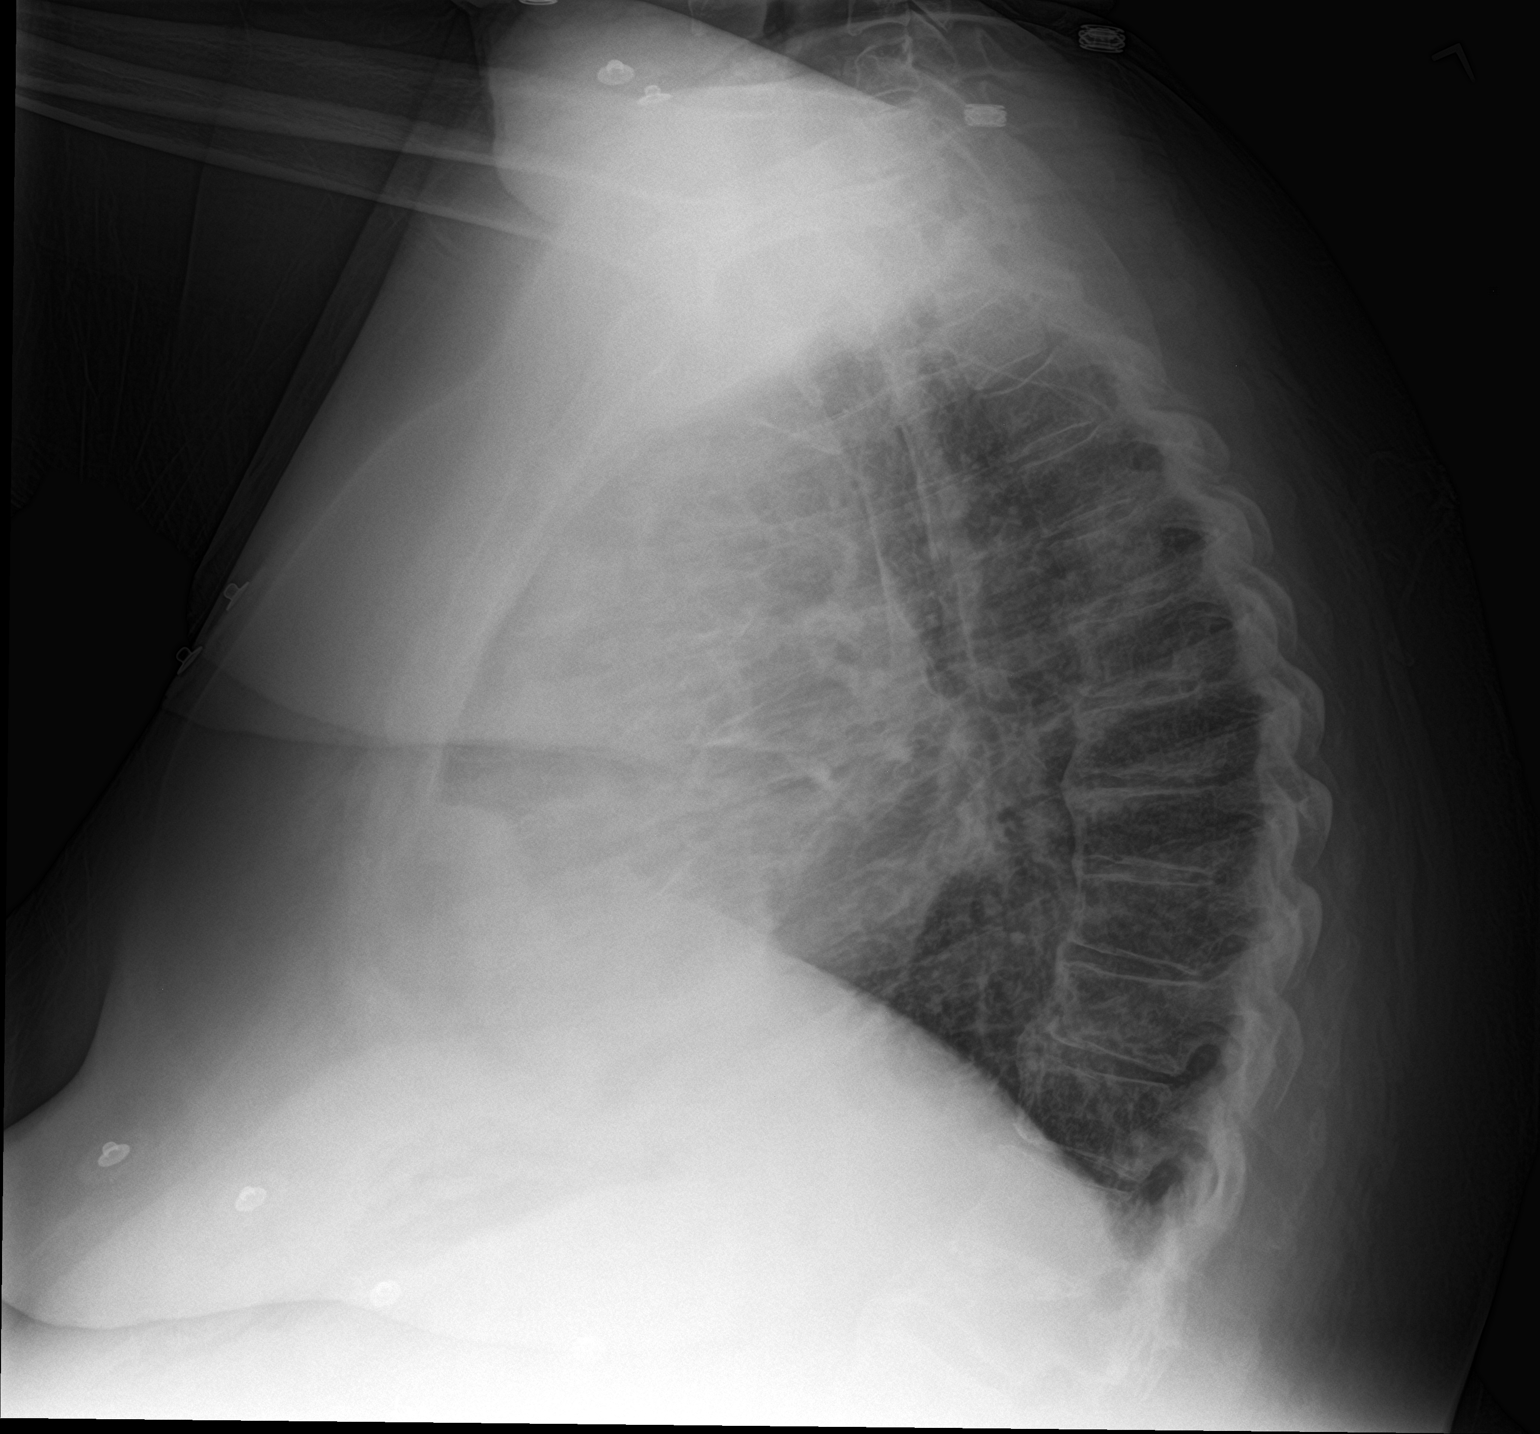

[2 of 2 positions shown; findings below may reference images not displayed]

FINDINGS: Bilateral diffuse interstitial thickening. No pleural effusion or
pneumothorax. Stable cardiomegaly. No acute osseous abnormality.
IMPRESSION: Findings concerning for mild CHF.

## 2018-05-09 ENCOUNTER — Emergency Department
Admission: EM | Admit: 2018-05-09 | Discharge: 2018-05-10 | Disposition: A | Discharge status: Home / Self Care | Payer: Medicare Other | Attending: Emergency Medicine | Admitting: Emergency Medicine

## 2018-05-09 ENCOUNTER — Emergency Department: Payer: Medicare Other

## 2018-05-09 ENCOUNTER — Encounter: Payer: Self-pay | Admitting: Emergency Medicine

## 2018-05-09 DIAGNOSIS — Z7901 Long term (current) use of anticoagulants: Secondary | ICD-10-CM | POA: Diagnosis not present

## 2018-05-09 DIAGNOSIS — R1032 Left lower quadrant pain: Secondary | ICD-10-CM | POA: Diagnosis present

## 2018-05-09 DIAGNOSIS — E119 Type 2 diabetes mellitus without complications: Secondary | ICD-10-CM | POA: Diagnosis not present

## 2018-05-09 DIAGNOSIS — Z794 Long term (current) use of insulin: Secondary | ICD-10-CM | POA: Diagnosis not present

## 2018-05-09 DIAGNOSIS — R9431 Abnormal electrocardiogram [ECG] [EKG]: Secondary | ICD-10-CM | POA: Diagnosis not present

## 2018-05-09 DIAGNOSIS — K5641 Fecal impaction: Secondary | ICD-10-CM | POA: Diagnosis not present

## 2018-05-09 DIAGNOSIS — E876 Hypokalemia: Secondary | ICD-10-CM | POA: Insufficient documentation

## 2018-05-09 DIAGNOSIS — I5032 Chronic diastolic (congestive) heart failure: Secondary | ICD-10-CM | POA: Insufficient documentation

## 2018-05-09 DIAGNOSIS — I130 Hypertensive heart and chronic kidney disease with heart failure and stage 1 through stage 4 chronic kidney disease, or unspecified chronic kidney disease: Secondary | ICD-10-CM | POA: Insufficient documentation

## 2018-05-09 DIAGNOSIS — Z87891 Personal history of nicotine dependence: Secondary | ICD-10-CM | POA: Diagnosis not present

## 2018-05-09 DIAGNOSIS — Z79899 Other long term (current) drug therapy: Secondary | ICD-10-CM | POA: Diagnosis not present

## 2018-05-09 DIAGNOSIS — N189 Chronic kidney disease, unspecified: Secondary | ICD-10-CM | POA: Insufficient documentation

## 2018-05-09 DIAGNOSIS — K5900 Constipation, unspecified: Secondary | ICD-10-CM

## 2018-05-09 LAB — MAGNESIUM: MAGNESIUM: 2.8 mg/dL — AB (ref 1.7–2.4)

## 2018-05-09 LAB — COMPREHENSIVE METABOLIC PANEL
ALK PHOS: 84 U/L (ref 38–126)
ALT: 17 U/L (ref 0–44)
AST: 30 U/L (ref 15–41)
Albumin: 3.6 g/dL (ref 3.5–5.0)
Anion gap: 14 (ref 5–15)
BILIRUBIN TOTAL: 0.8 mg/dL (ref 0.3–1.2)
BUN: 65 mg/dL — AB (ref 8–23)
CHLORIDE: 88 mmol/L — AB (ref 98–111)
CO2: 38 mmol/L — ABNORMAL HIGH (ref 22–32)
CREATININE: 1.56 mg/dL — AB (ref 0.44–1.00)
Calcium: 9 mg/dL (ref 8.9–10.3)
GFR calc Af Amer: 40 mL/min — ABNORMAL LOW (ref >60)
GFR, EST NON AFRICAN AMERICAN: 35 mL/min — AB (ref >60)
Glucose, Bld: 206 mg/dL — ABNORMAL HIGH (ref 70–99)
Potassium: 2.9 mmol/L — ABNORMAL LOW (ref 3.5–5.1)
Sodium: 140 mmol/L (ref 135–145)
TOTAL PROTEIN: 7.9 g/dL (ref 6.5–8.1)

## 2018-05-09 LAB — CBC
HCT: 34.7 % — ABNORMAL LOW (ref 35.0–47.0)
Hemoglobin: 11.4 g/dL — ABNORMAL LOW (ref 12.0–16.0)
MCH: 27.5 pg (ref 26.0–34.0)
MCHC: 32.8 g/dL (ref 32.0–36.0)
MCV: 83.8 fL (ref 80.0–100.0)
PLATELETS: 266 10*3/uL (ref 150–440)
RBC: 4.14 MIL/uL (ref 3.80–5.20)
RDW: 19.8 % — AB (ref 11.5–14.5)
WBC: 15.6 10*3/uL — AB (ref 3.6–11.0)

## 2018-05-09 LAB — LIPASE, BLOOD: LIPASE: 49 U/L (ref 11–51)

## 2018-05-09 MED ORDER — IOHEXOL 300 MG/ML  SOLN
75.0000 mL | Freq: Once | INTRAMUSCULAR | Status: AC | PRN
  Administered 2018-05-09: 75 mL via INTRAVENOUS

## 2018-05-09 MED ORDER — DOCUSATE SODIUM 50 MG/5ML PO LIQD
50.0000 mg | Freq: Once | ORAL | Status: AC
  Administered 2018-05-10: 50 mg via ORAL
  Filled 2018-05-09: qty 10

## 2018-05-09 MED ORDER — MAGNESIUM SULFATE 2 GM/50ML IV SOLN
2.0000 g | Freq: Once | INTRAVENOUS | Status: AC
  Administered 2018-05-10: 01:00:00 via INTRAVENOUS
  Filled 2018-05-09: qty 50

## 2018-05-09 MED ORDER — POTASSIUM CHLORIDE 10 MEQ/100ML IV SOLN
10.0000 meq | Freq: Once | INTRAVENOUS | Status: AC
  Administered 2018-05-09: 10 meq via INTRAVENOUS
  Filled 2018-05-09: qty 100

## 2018-05-09 MED ORDER — POTASSIUM CHLORIDE CRYS ER 20 MEQ PO TBCR
40.0000 meq | EXTENDED_RELEASE_TABLET | Freq: Once | ORAL | Status: AC
  Administered 2018-05-09: 40 meq via ORAL
  Filled 2018-05-09: qty 2

## 2018-05-09 NOTE — ED Notes (Signed)
Pt complain of potassium burning her arm.  Dr. Fransisco Hertz aware.  Rate decreased to 50cc/hr.

## 2018-05-09 NOTE — ED Triage Notes (Signed)
PT arrives via ems with complaints of lower left and lower right abdominal pain that started 3 days prior.  Pt states her last bowel movement 3 days prior.

## 2018-05-09 NOTE — ED Notes (Signed)
Disimpaction completed by Dr. Alfred Levins.

## 2018-05-09 NOTE — ED Provider Notes (Addendum)
[facility] Emergency Department Provider Note  ____________________________________________  Time seen: Approximately 9:42 PM  I have reviewed the triage vital signs and the nursing notes.   HISTORY  Chief Complaint Abdominal Pain   HPI Andrea Rivers is a 62 y.o. female with a history of CHF, morbidly obesity, hypertension, anemia who presents for evaluation of abdominal pain.  Patient reports that she has been constipated for 3 days.  She usually goes to the bathroom several times a day.  Yesterday she started having abdominal pain that she describes as dull, located in her lower abdomen and rectal area, constant and nonradiating.  The pain is worse when she is trying to have a bowel movement.  The pain is currently moderate in intensity.  She is passing flatus, no prior abdominal surgeries or SBO.  No fever, chills, nausea, vomiting, dysuria, hematuria.  She tried Metamucil and milk of magnesia x1 today with no significant relief.  Past Medical History:  Diagnosis Date  . Anemia   . Anxiety   . CHF (congestive heart failure) ([place])   . Chronic kidney disease    INSUFFICIENCY  . Complication of anesthesia    had to be intubated during cataract surgery   " I could not breath "  . COPD (chronic obstructive pulmonary disease) ([place])   . Diabetes mellitus without complication ([place])   . Dyspnea    DOE  . Dysrhythmia    A FIB  . Edema   . Fracture of distal femur ([place]) 11/2017   left   . GERD (gastroesophageal reflux disease)   . History of kidney stones   . Hypertension   . Hypothyroidism    ABLATION  . Iron deficiency anemia 03/31/2017  . Neuropathy   . Orthopnea   . Sleep apnea    CPAP  . Vertigo   . Wheezing     Patient Active Problem List   Diagnosis Date Noted  . Closed displaced supracondylar fracture of distal end of left femur with intracondylar extension ([place]) 11/17/2017  . CKD (chronic kidney disease) 11/14/2017  . Hypothyroidism  11/14/2017  . Atrial fibrillation ([place]) 11/14/2017  . Anxiety state 11/14/2017  . Peripheral neuropathy 11/14/2017  . Fracture 11/14/2017  . Bradycardia 08/13/2017  . Microcytic anemia 03/31/2017  . Iron deficiency anemia 03/31/2017  . Chronic diastolic heart failure ([place]) 03/10/2017  . HTN (hypertension) 03/10/2017  . Diabetes ([place]) 03/10/2017  . Obstructive sleep apnea 03/10/2017  . Shock [facility])     Past Surgical History:  Procedure Laterality Date  . CATARACT EXTRACTION W/PHACO Left 01/26/2017   Procedure: CATARACT EXTRACTION PHACO AND INTRAOCULAR LENS PLACEMENT (IOC);  Surgeon: Estill Cotta, MD;  Location: [facility] ORS;  Service: Ophthalmology;  Laterality: Left;  [place]   1:02.2 AP     23.7 CDE   28.92 fluid pack lot# [id number] H exp.05/08/2018  . CHOLECYSTECTOMY    . ORIF FEMUR FRACTURE Left 11/17/2017   Procedure: OPEN REDUCTION INTERNAL FIXATION (ORIF) DISTAL FEMUR FRACTURE;  Surgeon: Shona Needles, MD;  Location: [facility];  Service: Orthopedics;  Laterality: Left;  . TONSILLECTOMY      Prior to Admission medications   Medication Sig Start Date End Date Taking? Authorizing Provider  albuterol (PROAIR HFA) 108 (90 Base) MCG/ACT inhaler inhale 2 puffs by mouth INTO LUNGS every 4 to 6 hours if needed for wheezing or shortness of breath 03/14/17   [provider]  albuterol (PROVENTIL) (2.5 MG/3ML) 0.083% nebulizer solution Inhale 3 mLs (2.5 mg  total) into the lungs every 4 (four) hours as needed for wheezing or shortness of breath. Patient not taking: Reported on 03/30/2018 11/21/17   Raiford Noble Latif, DO  Amino Acids-Protein Hydrolys (FEEDING SUPPLEMENT, PRO-STAT SUGAR FREE 64,) LIQD Take 30 mLs by mouth 3 (three) times daily with meals.    [provider]  amiodarone (PACERONE) 200 MG tablet Take 200 mg by mouth daily. 05/10/17   [provider]  azelastine (ASTELIN) 0.1 % nasal spray Place 2 sprays into both nostrils 2 (two) times daily. 10/24/17   [provider]  Calcium Carbonate-Vitamin D3 (CALCIUM 600-D) 600-400 MG-UNIT TABS Take 1 tablet by mouth daily.    [provider]  Cholecalciferol (D3 HIGH POTENCY) 2000 units CAPS Take 4,000 Units by mouth daily.     [provider]  citalopram (CELEXA) 20 MG tablet Take 40 mg by mouth daily.     [provider]  cloNIDine (CATAPRES) 0.1 MG tablet Take 1 tablet (0.1 mg total) by mouth 2 (two) times daily. 07/12/17   Alisa Graff, FNP  co-enzyme Q-10 50 MG capsule Take 100 mg by mouth daily.     [provider]  cyclobenzaprine (FLEXERIL) 5 MG tablet Take 5 mg by mouth 3 (three) times daily as needed for muscle spasms.    [provider]  Fluticasone-Salmeterol (ADVAIR) 250-50 MCG/DOSE AEPB Inhale 1 puff into the lungs 2 (two) times daily.    [provider]  gabapentin (NEURONTIN) 100 MG capsule Take 100 mg by mouth 3 (three) times daily as needed (for nerve pain).  09/05/17 09/05/18  [provider]  Garlic 6962 MG CAPS Take 1,000 mg by mouth daily.     [provider]  glimepiride (AMARYL) 2 MG tablet Take 2 mg by mouth daily with breakfast.    [provider]  HYDROcodone-acetaminophen (NORCO) 10-325 MG tablet Take 1 tablet by mouth every 6 (six) hours as needed.    [provider]  Insulin Detemir (LEVEMIR FLEXTOUCH) 100 UNIT/ML Pen Inject into the skin. 03/01/18   [provider]  insulin lispro (HUMALOG) 100 UNIT/ML injection Inject into the skin 3 (three) times daily before meals.    [provider]  levothyroxine (SYNTHROID, LEVOTHROID) 175 MCG tablet Take 175 mcg by mouth every evening.     [provider]  linagliptin (TRADJENTA) 5 MG TABS tablet take 1 tablet by mouth once daily 01/03/18   [provider]  loratadine (CLARITIN) 10 MG tablet Take 1 tablet (10 mg total) by mouth daily. 11/22/17   Sheikh, Omair Latif, DO  LORazepam (ATIVAN) 1 MG tablet Take 1 tablet (1  mg total) by mouth 2 (two) times daily. 02/06/18 02/06/19  Earleen Newport, MD  lovastatin (MEVACOR) 20 MG tablet Take 20 mg by mouth at bedtime.    [provider]  magnesium oxide (MAG-OX) 400 MG tablet Take 400 mg by mouth 2 (two) times daily.    [provider]  meclizine (ANTIVERT) 12.5 MG tablet Take 1 tablet (12.5 mg total) by mouth 3 (three) times daily as needed for dizziness. 03/03/17   Gladstone Lighter, MD  metolazone (ZAROXOLYN) 5 MG tablet Take 5 mg by mouth as needed (for a weight gain of 2 pounds overnight or 5 pounds in a week; MAX OF 5 TABLETS AT A TIME).  04/18/17   [provider]  Multiple Vitamins-Minerals (CENTRUM WOMEN) TABS Take 1 tablet by mouth daily.     [provider]  Omega-3 Fatty Acids (FISH OIL) 1000 MG CPDR Take 1 capsule by mouth daily.    [provider]  pantoprazole (PROTONIX) 40 MG tablet Take 40 mg by mouth 2 (two) times daily.    [provider]  polyethylene glycol (MIRALAX / GLYCOLAX) packet Take 17 g by mouth 2 (two) times daily. Patient not taking: Reported on 03/30/2018 11/21/17   Raiford Noble Latif, DO  POTASSIUM PO Take 400 mg by mouth See admin instructions. Take 400 mg by mouth two times a day and take an extra 800 mg daily when taking Zaroxolyn    [provider]  senna-docusate (SENOKOT-S) 8.6-50 MG tablet Take 1 tablet by mouth at bedtime as needed for mild constipation. Patient not taking: Reported on 03/30/2018 11/21/17   Raiford Noble Latif, DO  sodium phosphate (FLEET) 7-19 GM/118ML ENEM Place 133 mLs (1 enema total) rectally once as needed for severe constipation. Patient not taking: Reported on 03/30/2018 11/21/17   Raiford Noble Latif, DO  torsemide (DEMADEX) 20 MG tablet Take 2 tablets (40 mg total) by mouth 2 (two) times daily. 09/09/17   Alisa Graff, FNP  vitamin C (ASCORBIC ACID) 500 MG tablet Take 500 mg by mouth daily.    [provider]  warfarin (COUMADIN) 1 MG  tablet Take 1 mg daily at 6 PM by mouth.     [provider]  warfarin (COUMADIN) 5 MG tablet Take 5 mg by mouth daily at 6 PM.  04/22/17   [provider]  zinc sulfate 220 (50 Zn) MG capsule Take 220 mg by mouth daily.    [provider]    Allergies Other; Latex; Exenatide; Garlic; Onion; Rosiglitazone; and Erythromycin  Family History  Problem Relation Age of Onset  . COPD Mother   . Heart disease Mother   . Anemia Mother   . Heart disease Father   . COPD Father   . Anemia Sister   . Diabetes Maternal Grandmother   . Hypertension Paternal Grandfather     Social History Social History   Tobacco Use  . Smoking status: Former Smoker    Packs/day: 3.00    Types: Cigarettes    Last attempt to quit: 1988    Years since quitting: 31.7  . Smokeless tobacco: Never Used  Substance Use Topics  . Alcohol use: No  . Drug use: No    Review of Systems  Constitutional: Negative for fever. Eyes: Negative for visual changes. ENT: Negative for sore throat. Neck: No neck pain  Cardiovascular: Negative for chest pain. Respiratory: Negative for shortness of breath. Gastrointestinal: + abdominal pain and constipation. No vomiting or diarrhea. Genitourinary: Negative for dysuria. + rectal pressure Musculoskeletal: Negative for back pain. Skin: Negative for rash. Neurological: Negative for headaches, weakness or numbness. Psych: No SI or HI  ____________________________________________   PHYSICAL EXAM:  VITAL SIGNS: ED Triage Vitals  Enc Vitals Group     BP 05/09/18 1738 (!) 141/83     Pulse Rate 05/09/18 1738 71     Resp 05/09/18 1738 18     Temp 05/09/18 1738 98.4 F (36.9 C)     Temp Source 05/09/18 1738 Oral     SpO2 05/09/18 1738 100 %     Weight 05/09/18 1737 (!) 305 lb (138.3 kg)     Height 05/09/18 1737 5\' 5"  (1.651 m)     Head Circumference --      Peak Flow --      Pain Score 05/09/18 1737  10     Pain Loc --      Pain Edu? --       Excl. in [place]? --     Constitutional: Alert and oriented. Well appearing and in no apparent distress. HEENT:      Head: Normocephalic and atraumatic.         Eyes: Conjunctivae are normal. Sclera is non-icteric.       Mouth/Throat: Mucous membranes are moist.       Neck: Supple with no signs of meningismus. Cardiovascular: Regular rate and rhythm. No murmurs, gallops, or rubs. 2+ symmetrical distal pulses are present in all extremities. No JVD. Respiratory: Normal respiratory effort. Lungs are clear to auscultation bilaterally. No wheezes, crackles, or rhonchi.  Gastrointestinal: Morbidly obese, LLQ ttp, with positive bowel sounds. No rebound or guarding. Genitourinary: No CVA tenderness. Musculoskeletal: Nontender with normal range of motion in all extremities. No edema, cyanosis, or erythema of extremities. Neurologic: Normal speech and language. Face is symmetric. Moving all extremities. No gross focal neurologic deficits are appreciated. Skin: Skin is warm, dry and intact. No rash noted. Psychiatric: Mood and affect are normal. Speech and behavior are normal.  ____________________________________________   LABS (all labs ordered are listed, but only abnormal results are displayed)  Labs Reviewed  COMPREHENSIVE METABOLIC PANEL - Abnormal; Notable for the following components:      Result Value   Potassium 2.9 (*)    Chloride 88 (*)    CO2 38 (*)    Glucose, Bld 206 (*)    BUN 65 (*)    Creatinine, Ser 1.56 (*)    GFR calc non Af Amer 35 (*)    GFR calc Af Amer 40 (*)    All other components within normal limits  CBC - Abnormal; Notable for the following components:   WBC 15.6 (*)    Hemoglobin 11.4 (*)    HCT 34.7 (*)    RDW 19.8 (*)    All other components within normal limits  LIPASE, BLOOD  URINALYSIS, COMPLETE (UACMP) WITH MICROSCOPIC  MAGNESIUM   ____________________________________________  EKG  ED ECG REPORT I, Rudene Re, the attending physician,  personally viewed and interpreted this ECG.  Normal sinus rhythm, rate of 60, occasional PVCs, nonspecific intraventricular conduction delay, prolonged QTC, normal axis, no ST elevations or depressions. ____________________________________________  RADIOLOGY  I have personally reviewed the images performed during this visit and I agree with the Radiologist's read.   Interpretation by Radiologist:  Ct Abdomen Pelvis W Contrast  Result Date: 05/09/2018 CLINICAL DATA:  Lower quadrant abdominal pain EXAM: CT ABDOMEN AND PELVIS WITH CONTRAST TECHNIQUE: Multidetector CT imaging of the abdomen and pelvis was performed using the standard protocol following bolus administration of intravenous contrast. CONTRAST:  85mL OMNIPAQUE IOHEXOL 300 MG/ML  SOLN COMPARISON:  Radiograph 07/01/2011 FINDINGS: Lower chest: Lung bases demonstrate no acute consolidation or effusion. Borderline to mild cardiomegaly. Hepatobiliary: No focal liver abnormality is seen. Status post cholecystectomy. No biliary dilatation. Pancreas: Unremarkable. No pancreatic ductal dilatation or surrounding inflammatory changes. Spleen: Normal in size without focal abnormality. Adrenals/Urinary Tract: Right adrenal gland is within normal limits. 12 mm low-density nodule in the left adrenal gland consistent with small adenoma. No hydronephrosis. Cyst in the upper left kidney. Bladder normal. Stomach/Bowel: Stomach nonenlarged. No dilated small bowel. Diffuse fluid and stools within the colon. Midline cecum. Negative appendix. Small moderate retained feces at the rectum. Asymmetric soft tissue fullness at the left low rectum/anus. This measures 4.7 cm by 3.7 cm.  Vascular/Lymphatic: Nonaneurysmal aorta. Mild aortic atherosclerosis. No significantly enlarged lymph nodes. Reproductive: Uterus and bilateral adnexa are unremarkable. Other: No free air or free fluid. Probable small subcutaneous hematomas in the right subcutaneous fat of the anterior  abdominal wall. Musculoskeletal: Old compression fracture of L1 with irregular disc space at T12-L1, chronic. IMPRESSION: 1. No definite bowel obstruction identified. Diffuse liquid stools within the colon with more formed feces in the rectosigmoid colon. Small moderate retained feces at the rectum. Asymmetric 4.7 x 3.7 cm soft tissue density at the low left rectum/anus, possible mass lesion. Recommend correlation with direct inspection. 2. No other acute intra-abdominal or pelvic abnormalities. Electronically Signed   By: Donavan Foil M.D.   On: 05/09/2018 22:23      ____________________________________________   PROCEDURES  Procedure(s) performed:yes Procedures   ------------------------------------------------------------------------------------------------------------------- Fecal Disimpaction Procedure Note:  Performed by me:  Patient placed in the lateral recumbent position with knees drawn towards chest. Nurse present for patient support. Large amount of hard brown stool removed. No complications during procedure.   ------------------------------------------------------------------------------------------------------------------   Critical Care performed:  Yes  CRITICAL CARE Performed by: Rudene Re  ?  Total critical care time: 35 min  Critical care time was exclusive of separately billable procedures and treating other patients.  Critical care was necessary to treat or prevent imminent or life-threatening deterioration.  Critical care was time spent personally by me on the following activities: development of treatment plan with patient and/or surrogate as well as nursing, discussions with consultants, evaluation of patient's response to treatment, examination of patient, obtaining history from patient or surrogate, ordering and performing treatments and interventions, ordering and review of laboratory studies, ordering and review of radiographic studies, pulse  oximetry and re-evaluation of patient's condition.  ____________________________________________   INITIAL IMPRESSION / ASSESSMENT AND PLAN / ED COURSE  62 y.o. female with a history of CHF, morbidly obesity, hypertension, anemia who presents for evaluation of abdominal pain and constipation.  Patient is morbidly obese which limits exam due to body habitus however she does have left lower quadrant tenderness with no rebound or guarding and positive bowel sounds.  Vitals are within normal limits with no fever.  Labs show leukocytosis with white count of 15 therefore CT abdomen pelvis has been ordered to rule out diverticulitis, SBO, appendicitis.  Labs also showing hypokalemia which patient has a history of.  Will get an EKG and supplement.    _________________________ 11:15 PM on 05/09/2018 ----------------------------------------- CT of the abdomen and pelvis showed no acute pathology other than fecal impaction.  Patient was disimpacted per procedure note above.  EKG was done showing prolonged QTC of 666 in the setting of hypokalemia.  Patient is being supplemented IV and received p.o. pills as well.  Magnesium is pending.  Recommended admission for IV potassium and telemetry monitoring but patient prefers to go home.  Patient has requested a repeat EKG and potassium levels after she receives the IV potassium and if those have normalized she would prefer to go home, otherwise she will stay in the hospital.  Discussed this plan with Dr. Beather Arbour who will repeat EKG and follow-up repeat labs    As part of my medical decision making, I reviewed the following data within the [place] notes reviewed and incorporated, Labs reviewed , EKG interpreted , Old EKG reviewed, Old chart reviewed, Radiograph reviewed , Notes from prior ED visits and [place] Controlled Substance Database    Pertinent labs & imaging results that were available during  my care of the patient were reviewed by me  and considered in my medical decision making (see chart for details).    ____________________________________________   FINAL CLINICAL IMPRESSION(S) / ED DIAGNOSES  Final diagnoses:  Constipation, unspecified constipation type  Hypokalemia  Prolonged QT interval  Fecal impaction (HCC)      NEW MEDICATIONS STARTED DURING THIS VISIT:  ED Discharge Orders    None       Note:  This document was prepared using Dragon voice recognition software and may include unintentional dictation errors.    Rudene Re, MD 05/09/18 [place], [place], MD 05/23/18 [place], [place], MD 05/27/18 [phone]

## 2018-05-09 NOTE — Discharge Instructions (Addendum)
Constipation: Take colace twice a day everyday. Take senna once a day at bedtime. Take daily probiotics. Drink plenty of fluids and eat a diet rich in fiber. If you go more than 3 days without a bowel movement, take 1 cap full of Miralax in the morning and one in the evening up to 5 days.   Continue to take your home potassium.

## 2018-05-10 DIAGNOSIS — K5641 Fecal impaction: Secondary | ICD-10-CM | POA: Diagnosis not present

## 2018-05-10 LAB — URINALYSIS, COMPLETE (UACMP) WITH MICROSCOPIC
BACTERIA UA: NONE SEEN
Bilirubin Urine: NEGATIVE
Glucose, UA: NEGATIVE mg/dL
HGB URINE DIPSTICK: NEGATIVE
Ketones, ur: NEGATIVE mg/dL
Nitrite: NEGATIVE
PROTEIN: 100 mg/dL — AB
SPECIFIC GRAVITY, URINE: 1.026 (ref 1.005–1.030)
pH: 8 (ref 5.0–8.0)

## 2018-05-10 LAB — BASIC METABOLIC PANEL
Anion gap: 11 (ref 5–15)
BUN: 57 mg/dL — AB (ref 8–23)
CO2: 38 mmol/L — ABNORMAL HIGH (ref 22–32)
CREATININE: 1.45 mg/dL — AB (ref 0.44–1.00)
Calcium: 9.1 mg/dL (ref 8.9–10.3)
Chloride: 90 mmol/L — ABNORMAL LOW (ref 98–111)
GFR calc Af Amer: 44 mL/min — ABNORMAL LOW (ref >60)
GFR, EST NON AFRICAN AMERICAN: 38 mL/min — AB (ref >60)
GLUCOSE: 139 mg/dL — AB (ref 70–99)
Potassium: 3.3 mmol/L — ABNORMAL LOW (ref 3.5–5.1)
SODIUM: 139 mmol/L (ref 135–145)

## 2018-05-10 NOTE — ED Notes (Signed)
Soap suds enema instilled.  Pt tolerated entire soap suds enema.  Pt helped to the bathroom to evacuate.  Pt able to have large amount of stool, soft and formed.

## 2018-05-10 NOTE — ED Provider Notes (Signed)
-----------------------------------------   1:30 AM on 05/10/2018 -----------------------------------------  Patient having successful bowel movement status post enema.  Repeat metabolic panel demonstrates improvement in potassium.  IV magnesium infusing.  Repeat EKG demonstrates improvement in QTC.   ----------------------------------------- 3:06 AM on 05/10/2018 -----------------------------------------  Magnesium infusion completed.  Patient feeling significantly better and still desires to be discharged home.  I think this is reasonable in light of improved potassium and QTC.  She will follow-up closely with her PCP this week.  Strict return precautions given.  Patient and family members verbalize understanding and agree with plan of care.   Paulette Blanch, MD 05/10/18 [phone]

## 2018-05-10 NOTE — ED Notes (Signed)
Pt is still on commode since having enema.  Continues to produce stool

## 2018-05-10 NOTE — ED Notes (Signed)
md into speak with pt

## 2018-06-06 NOTE — Progress Notes (Signed)
Patient ID: Andrea Rivers, female    DOB: 12-02-1955, 62 y.o.   MRN: [id number]  HPI  [name] is a 62 y/o female with a history of anemia, anxiety, CKD, COPD, DM, GERD, HTN, hypothyroidism, neuropathy, obstructive sleep apnea (+CPAP), previous tobacco use and chronic heart failure.  Echo report from 02/23/17 reviewed and showed an EF of 55-60% along with mild MR. Echo done 01/26/17 shows an EF of 50-55%.   Was in the ED 05/09/18 due to constipation. Fecal disimpaction and enema given. IV potassium/magnesium given and she was released.     She presents today for a follow-up visit with a chief complaint of minimal fatigue upon moderate exertion. She describes this as chronic in nature having been present for several years. She has associated pedal edema, light-headedness, chronic back pain, easy bruising and depression along with this. She denies any difficulty sleeping, abdominal distention, palpitations, chest pain, shortness of breath, cough or weight gain.   Past Medical History:  Diagnosis Date  . Anemia   . Anxiety   . CHF (congestive heart failure) ([place])   . Chronic kidney disease    INSUFFICIENCY  . Complication of anesthesia    had to be intubated during cataract surgery   " I could not breath "  . COPD (chronic obstructive pulmonary disease) ([place])   . Diabetes mellitus without complication ([place])   . Dyspnea    DOE  . Dysrhythmia    A FIB  . Edema   . Fracture of distal femur ([place]) 11/2017   left   . GERD (gastroesophageal reflux disease)   . History of kidney stones   . Hypertension   . Hypothyroidism    ABLATION  . Iron deficiency anemia 03/31/2017  . Neuropathy   . Orthopnea   . Sleep apnea    CPAP  . Vertigo   . Wheezing    Past Surgical History:  Procedure Laterality Date  . CATARACT EXTRACTION W/PHACO Left 01/26/2017   Procedure: CATARACT EXTRACTION PHACO AND INTRAOCULAR LENS PLACEMENT (IOC);  Surgeon: Estill Cotta, MD;  Location: [facility] OR[facility];  Service:  Ophthalmology;  Laterality: Left;  [place]   1:02.2 AP     23.7 CDE   28.92 fluid pack lot# [id number] H exp.05/08/2018  . CHOLECYSTECTOMY    . ORIF FEMUR FRACTURE Left 11/17/2017   Procedure: OPEN REDUCTION INTERNAL FIXATION (ORIF) DISTAL FEMUR FRACTURE;  Surgeon: Shona Needles, MD;  Location: [place];  Service: Orthopedics;  Laterality: Left;  . TONSILLECTOMY     Family History  Problem Relation Age of Onset  . COPD Mother   . Heart disease Mother   . Anemia Mother   . Heart disease Father   . COPD Father   . Anemia Sister   . Diabetes Maternal Grandmother   . Hypertension Paternal Grandfather    Social History   Tobacco Use  . Smoking status: Former Smoker    Packs/day: 3.00    Types: Cigarettes    Last attempt to quit: 1988    Years since quitting: 31.8  . Smokeless tobacco: Never Used  Substance Use Topics  . Alcohol use: No   Allergies  Allergen Reactions  . Other Other (See Comments)    ILOSONE- Caused GI distress also  . Latex Hives  . Exenatide Nausea Only and Other (See Comments)    [place]- Nausea and abdominal pain, also  . Garlic Other (See Comments)    Severe acid reflux  . Onion Other (See Comments)  Severe acid reflux  . Rosiglitazone Other (See Comments)    [place]- Affected heart  . Erythromycin Nausea And Vomiting    GI DISTRESS   Prior to Admission medications   Medication Sig Start Date End Date Taking? Authorizing Provider  albuterol (PROAIR HFA) 108 (90 Base) MCG/ACT inhaler inhale 2 puffs by mouth INTO LUNGS every 4 to 6 hours if needed for wheezing or shortness of breath 03/14/17  Yes [provider]  albuterol (PROVENTIL) (2.5 MG/3ML) 0.083% nebulizer solution Inhale 3 mLs (2.5 mg total) into the lungs every 4 (four) hours as needed for wheezing or shortness of breath. 11/21/17  Yes Sheikh, Omair Latif, DO  Amino Acids-Protein Hydrolys (FEEDING SUPPLEMENT, PRO-STAT SUGAR FREE 64,) LIQD Take 30 mLs by mouth 3 (three) times daily with meals.    Yes [provider]  amiodarone (PACERONE) 200 MG tablet Take 200 mg by mouth daily. 05/10/17  Yes [provider]  azelastine (ASTELIN) 0.1 % nasal spray Place 2 sprays into both nostrils 2 (two) times daily. 10/24/17  Yes [provider]  Calcium Carbonate-Vitamin D3 (CALCIUM 600-D) 600-400 MG-UNIT TABS Take 1 tablet by mouth daily.   Yes [provider]  Cholecalciferol (D3 HIGH POTENCY) 2000 units CAPS Take 4,000 Units by mouth daily.    Yes [provider]  citalopram (CELEXA) 20 MG tablet Take 40 mg by mouth daily.    Yes [provider]  clonazePAM (KLONOPIN) 1 MG tablet Take 1 tablet by mouth as needed. 05/17/18  Yes [provider]  cloNIDine (CATAPRES) 0.1 MG tablet Take 1 tablet (0.1 mg total) by mouth 2 (two) times daily. 07/12/17  Yes Yasmene Salomone, Otila Kluver A, FNP  co-enzyme Q-10 50 MG capsule Take 100 mg by mouth daily.    Yes [provider]  cyclobenzaprine (FLEXERIL) 5 MG tablet Take 5 mg by mouth 3 (three) times daily as needed for muscle spasms.   Yes [provider]  Fluticasone-Salmeterol (WIXELA INHUB) 250-50 MCG/DOSE AEPB Inhale 1 puff into the lungs 2 (two) times daily.   Yes [provider]  gabapentin (NEURONTIN) 100 MG capsule Take 100 mg by mouth 3 (three) times daily as needed (for nerve pain).  09/05/17 09/05/18 Yes [provider]  Garlic 7412 MG CAPS Take 1,000 mg by mouth daily.    Yes [provider]  glimepiride (AMARYL) 2 MG tablet Take 2 mg by mouth daily with breakfast.   Yes [provider]  HYDROcodone-acetaminophen (NORCO) 10-325 MG tablet Take 1 tablet by mouth every 6 (six) hours as needed.   Yes [provider]  Insulin Detemir (LEVEMIR FLEXTOUCH) 100 UNIT/ML Pen Inject into the skin. 03/01/18  Yes [provider]  insulin lispro (HUMALOG) 100 UNIT/ML injection Inject into the skin 3 (three) times daily before meals.   Yes [provider]  levothyroxine (SYNTHROID, LEVOTHROID) 175 MCG tablet Take 175 mcg by mouth every evening.    Yes [provider]  linagliptin (TRADJENTA) 5 MG TABS tablet take 1 tablet by mouth once daily 01/03/18  Yes [provider]  loratadine (CLARITIN) 10 MG tablet Take 1 tablet (10 mg total) by mouth daily. 11/22/17  Yes Sheikh, Omair Latif, DO  LORazepam (ATIVAN) 1 MG tablet Take 1 tablet (1 mg total) by mouth 2 (two) times daily. 02/06/18 02/06/19 Yes Earleen Newport, MD  lovastatin (MEVACOR) 20 MG tablet Take 20 mg by mouth at bedtime.   Yes [provider]  magnesium oxide (MAG-OX) 400 MG  tablet Take 400 mg by mouth 2 (two) times daily.   Yes [provider]  meclizine (ANTIVERT) 12.5 MG tablet Take 1 tablet (12.5 mg total) by mouth 3 (three) times daily as needed for dizziness. 03/03/17  Yes Gladstone Lighter, MD  metolazone (ZAROXOLYN) 5 MG tablet Take 5 mg by mouth as needed (for a weight gain of 2 pounds overnight or 5 pounds in a week; MAX OF 5 TABLETS AT A TIME).  04/18/17  Yes [provider]  Multiple Vitamins-Minerals (CENTRUM WOMEN) TABS Take 1 tablet by mouth daily.    Yes [provider]  Omega-3 Fatty Acids (FISH OIL) 1000 MG CPDR Take 1 capsule by mouth daily.   Yes [provider]  pantoprazole (PROTONIX) 40 MG tablet Take 40 mg by mouth 2 (two) times daily.   Yes [provider]  polyethylene glycol (MIRALAX / GLYCOLAX) packet Take 17 g by mouth 2 (two) times daily. 11/21/17  Yes Sheikh, Omair Latif, DO  POTASSIUM PO Take 400 mg by mouth See admin instructions. Take 400 mg by mouth two times a day and take an extra 800 mg daily when taking Zaroxolyn   Yes [provider]  senna-docusate (SENOKOT-S) 8.6-50 MG tablet Take 1 tablet by mouth at bedtime as needed for mild constipation. 11/21/17  Yes Sheikh, Omair Latif, DO  sodium phosphate (FLEET) 7-19 GM/118ML ENEM Place 133 mLs (1 enema total)  rectally once as needed for severe constipation. 11/21/17  Yes Sheikh, Omair Latif, DO  torsemide (DEMADEX) 20 MG tablet Take 2 tablets (40 mg total) by mouth 2 (two) times daily. 09/09/17  Yes Lorenza Shakir, Otila Kluver A, FNP  vitamin C (ASCORBIC ACID) 500 MG tablet Take 500 mg by mouth daily.   Yes [provider]  warfarin (COUMADIN) 1 MG tablet Take 1 mg daily at 6 PM by mouth.    Yes [provider]  warfarin (COUMADIN) 5 MG tablet Take 5 mg by mouth daily at 6 PM.  04/22/17  Yes [provider]  zinc sulfate 220 (50 Zn) MG capsule Take 220 mg by mouth daily.   Yes [provider]     Review of Systems  Constitutional: Positive for fatigue. Negative for appetite change.  HENT: Negative for congestion, postnasal drip and sore throat.   Eyes: Positive for visual disturbance (blurry vision in left eye). Negative for pain.  Respiratory: Negative for cough, chest tightness and shortness of breath.   Cardiovascular: Positive for leg swelling ("better"). Negative for chest pain and palpitations.  Gastrointestinal: Negative for abdominal distention and abdominal pain.  Endocrine: Negative.   Genitourinary: Negative.   Musculoskeletal: Positive for arthralgias (left knee) and back pain.  Skin: Negative.   Allergic/Immunologic: Negative.   Neurological: Positive for light-headedness (due to vertigo). Negative for dizziness.  Hematological: Negative for adenopathy. Bruises/bleeds easily.  Psychiatric/Behavioral: Positive for dysphoric mood. Negative for sleep disturbance (sleeping on 6 pillows due to comfort). The patient is not nervous/anxious.    Vitals:   06/07/18 1001  BP: 122/80  Pulse: 70  Resp: 18  SpO2: 90%  Weight: (!) 318 lb (144.2 kg)  Height: 5\' 5"  (1.651 m)   Wt Readings from Last 3 Encounters:  06/07/18 (!) 318 lb (144.2 kg)  05/09/18 (!) 305 lb (138.3 kg)  03/30/18 (!) 314 lb 6 oz (142.6 kg)   Lab Results  Component Value Date   CREATININE 1.45  (H) 05/10/2018   CREATININE 1.56 (H) 05/09/2018   CREATININE 1.79 (H) 02/06/2018  Physical Exam  Constitutional: She is oriented to person, place, and time. She appears well-developed and well-nourished.  HENT:  Head: Normocephalic and atraumatic.  Neck: Normal range of motion. Neck supple. No JVD present.  Cardiovascular: Normal rate and regular rhythm.  Pulmonary/Chest: Effort normal. She has no wheezes. She has no rales.  Abdominal: Soft. She exhibits no distension. There is no tenderness.  Musculoskeletal: She exhibits edema (2+ pitting edema in bilateral lower legs). She exhibits no tenderness.  Neurological: She is alert and oriented to person, place, and time.  Skin: Skin is warm and dry.  Psychiatric: She has a normal mood and affect. Her behavior is normal. Thought content normal.  Nursing note and vitals reviewed.   Assessment & Plan:  1: Chronic heart failure with preserved ejection fraction- - NYHA class II - euvolemic today - reminded to call for an overnight weight gain of >2 pounds or a weekly weight gain of >5 pounds - weight down 3 pounds since last visit here - not adding salt to her food  - saw cardiologist Ubaldo Glassing) 04/27/18 - currently receiving PT twice a week - does not meet ReDS vest criteria due to BMI - BNP 02/06/18 was 130.0 - reports receiving her flu vaccine for this season  2: HTN- - BP looks good today - BMP 05/10/18 reviewed and showed sodium 139, potassium 3.3, creatinine 1.45 and GFR 38 - saw PCP Wadie Lessen) 05/17/18  3: Diabetes- - fasting glucose yesterday was 108 - A1c from 09/20/17 was 7.3% - saw endocrinologist Honor Junes) 04/14/18  4: Hypokalemia- - discharge potassium level was low at 3.3 - she has continued to take her OTC potassium tablets - discussed that it would probably be better if we switched her to RX potassium - patient agreeable so will start on 16meq potassium daily - will see if cancer center will add BMP to her already  scheduled labs on 06/26/18  5: Lymphedema- - stage 2 - elevating them at times with some improvement; instructed to keep them elevated when sitting for long periods of time - unable to exercise much due to symptoms but does try to walk with her walker - encouraged to get compression socks and begin wearing them daily with removal at bedtime - consider lymphapress compression boots if symptoms persist  Patient did not bring her medications nor a list. Each medication was verbally reviewed with the patient and she was encouraged to bring the bottles to every visit to confirm accuracy of list.  Return in 5 weeks or sooner for any questions/problems before then.

## 2018-06-07 ENCOUNTER — Ambulatory Visit: Payer: Medicare Other | Attending: Family | Admitting: Family

## 2018-06-07 ENCOUNTER — Encounter: Payer: Self-pay | Admitting: Family

## 2018-06-07 VITALS — BP 122/80 | HR 70 | Resp 18 | Ht 65.0 in | Wt 318.0 lb

## 2018-06-07 DIAGNOSIS — Z79899 Other long term (current) drug therapy: Secondary | ICD-10-CM | POA: Insufficient documentation

## 2018-06-07 DIAGNOSIS — E876 Hypokalemia: Secondary | ICD-10-CM

## 2018-06-07 DIAGNOSIS — I130 Hypertensive heart and chronic kidney disease with heart failure and stage 1 through stage 4 chronic kidney disease, or unspecified chronic kidney disease: Secondary | ICD-10-CM | POA: Diagnosis not present

## 2018-06-07 DIAGNOSIS — Z79890 Hormone replacement therapy (postmenopausal): Secondary | ICD-10-CM | POA: Insufficient documentation

## 2018-06-07 DIAGNOSIS — Z7901 Long term (current) use of anticoagulants: Secondary | ICD-10-CM | POA: Insufficient documentation

## 2018-06-07 DIAGNOSIS — I5032 Chronic diastolic (congestive) heart failure: Secondary | ICD-10-CM | POA: Diagnosis present

## 2018-06-07 DIAGNOSIS — Z9049 Acquired absence of other specified parts of digestive tract: Secondary | ICD-10-CM | POA: Diagnosis not present

## 2018-06-07 DIAGNOSIS — E1122 Type 2 diabetes mellitus with diabetic chronic kidney disease: Secondary | ICD-10-CM | POA: Diagnosis not present

## 2018-06-07 DIAGNOSIS — G4733 Obstructive sleep apnea (adult) (pediatric): Secondary | ICD-10-CM | POA: Diagnosis not present

## 2018-06-07 DIAGNOSIS — Z87891 Personal history of nicotine dependence: Secondary | ICD-10-CM | POA: Insufficient documentation

## 2018-06-07 DIAGNOSIS — Z881 Allergy status to other antibiotic agents status: Secondary | ICD-10-CM | POA: Diagnosis not present

## 2018-06-07 DIAGNOSIS — Z87442 Personal history of urinary calculi: Secondary | ICD-10-CM | POA: Diagnosis not present

## 2018-06-07 DIAGNOSIS — N183 Chronic kidney disease, stage 3 (moderate): Secondary | ICD-10-CM

## 2018-06-07 DIAGNOSIS — K219 Gastro-esophageal reflux disease without esophagitis: Secondary | ICD-10-CM | POA: Insufficient documentation

## 2018-06-07 DIAGNOSIS — I890 Lymphedema, not elsewhere classified: Secondary | ICD-10-CM | POA: Diagnosis not present

## 2018-06-07 DIAGNOSIS — D631 Anemia in chronic kidney disease: Secondary | ICD-10-CM | POA: Diagnosis not present

## 2018-06-07 DIAGNOSIS — I10 Essential (primary) hypertension: Secondary | ICD-10-CM

## 2018-06-07 DIAGNOSIS — F419 Anxiety disorder, unspecified: Secondary | ICD-10-CM | POA: Diagnosis not present

## 2018-06-07 DIAGNOSIS — I4891 Unspecified atrial fibrillation: Secondary | ICD-10-CM | POA: Insufficient documentation

## 2018-06-07 DIAGNOSIS — N189 Chronic kidney disease, unspecified: Secondary | ICD-10-CM | POA: Diagnosis not present

## 2018-06-07 DIAGNOSIS — Z794 Long term (current) use of insulin: Secondary | ICD-10-CM | POA: Insufficient documentation

## 2018-06-07 DIAGNOSIS — E039 Hypothyroidism, unspecified: Secondary | ICD-10-CM | POA: Insufficient documentation

## 2018-06-07 DIAGNOSIS — J449 Chronic obstructive pulmonary disease, unspecified: Secondary | ICD-10-CM | POA: Diagnosis not present

## 2018-06-07 DIAGNOSIS — Z8249 Family history of ischemic heart disease and other diseases of the circulatory system: Secondary | ICD-10-CM | POA: Diagnosis not present

## 2018-06-07 DIAGNOSIS — E1140 Type 2 diabetes mellitus with diabetic neuropathy, unspecified: Secondary | ICD-10-CM | POA: Insufficient documentation

## 2018-06-07 MED ORDER — POTASSIUM CHLORIDE CRYS ER 20 MEQ PO TBCR: 40.0000 meq | EXTENDED_RELEASE_TABLET | Freq: Every day | ORAL | 3 refills | Status: DC

## 2018-06-07 NOTE — Patient Instructions (Signed)
Continue weighing daily and call for an overnight weight gain of > 2 pounds or a weekly weight gain of >5 pounds. 

## 2018-06-08 ENCOUNTER — Encounter: Payer: Self-pay | Admitting: Family

## 2018-06-08 ENCOUNTER — Other Ambulatory Visit: Payer: Self-pay | Admitting: Oncology

## 2018-06-08 DIAGNOSIS — I890 Lymphedema, not elsewhere classified: Secondary | ICD-10-CM | POA: Insufficient documentation

## 2018-06-08 DIAGNOSIS — E876 Hypokalemia: Secondary | ICD-10-CM | POA: Insufficient documentation

## 2018-06-08 DIAGNOSIS — D631 Anemia in chronic kidney disease: Secondary | ICD-10-CM

## 2018-06-08 DIAGNOSIS — D509 Iron deficiency anemia, unspecified: Secondary | ICD-10-CM

## 2018-06-08 DIAGNOSIS — N183 Chronic kidney disease, stage 3 (moderate): Principal | ICD-10-CM

## 2018-06-26 ENCOUNTER — Inpatient Hospital Stay: Payer: Medicare Other | Attending: Oncology

## 2018-06-26 DIAGNOSIS — N183 Chronic kidney disease, stage 3 (moderate): Secondary | ICD-10-CM | POA: Insufficient documentation

## 2018-06-26 DIAGNOSIS — D509 Iron deficiency anemia, unspecified: Secondary | ICD-10-CM | POA: Diagnosis present

## 2018-06-26 DIAGNOSIS — Z7901 Long term (current) use of anticoagulants: Secondary | ICD-10-CM | POA: Insufficient documentation

## 2018-06-26 DIAGNOSIS — I5032 Chronic diastolic (congestive) heart failure: Secondary | ICD-10-CM | POA: Diagnosis not present

## 2018-06-26 DIAGNOSIS — I130 Hypertensive heart and chronic kidney disease with heart failure and stage 1 through stage 4 chronic kidney disease, or unspecified chronic kidney disease: Secondary | ICD-10-CM | POA: Insufficient documentation

## 2018-06-26 DIAGNOSIS — D631 Anemia in chronic kidney disease: Secondary | ICD-10-CM | POA: Diagnosis not present

## 2018-06-26 DIAGNOSIS — E1122 Type 2 diabetes mellitus with diabetic chronic kidney disease: Secondary | ICD-10-CM | POA: Insufficient documentation

## 2018-06-26 DIAGNOSIS — D500 Iron deficiency anemia secondary to blood loss (chronic): Secondary | ICD-10-CM

## 2018-06-26 DIAGNOSIS — I4891 Unspecified atrial fibrillation: Secondary | ICD-10-CM | POA: Diagnosis not present

## 2018-06-26 LAB — CBC WITH DIFFERENTIAL/PLATELET
ABS IMMATURE GRANULOCYTES: 0.05 10*3/uL (ref 0.00–0.07)
BASOS PCT: 0 %
Basophils Absolute: 0 10*3/uL (ref 0.0–0.1)
Eosinophils Absolute: 0.1 10*3/uL (ref 0.0–0.5)
Eosinophils Relative: 1 %
HCT: 37.9 % (ref 36.0–46.0)
HEMOGLOBIN: 11.5 g/dL — AB (ref 12.0–15.0)
IMMATURE GRANULOCYTES: 0 %
LYMPHS ABS: 1.2 10*3/uL (ref 0.7–4.0)
Lymphocytes Relative: 10 %
MCH: 26.3 pg (ref 26.0–34.0)
MCHC: 30.3 g/dL (ref 30.0–36.0)
MCV: 86.5 fL (ref 80.0–100.0)
MONO ABS: 1 10*3/uL (ref 0.1–1.0)
Monocytes Relative: 8 %
NEUTROS ABS: 9.3 10*3/uL — AB (ref 1.7–7.7)
NEUTROS PCT: 81 %
PLATELETS: 216 10*3/uL (ref 150–400)
RBC: 4.38 MIL/uL (ref 3.87–5.11)
RDW: 18 % — ABNORMAL HIGH (ref 11.5–15.5)
WBC: 11.7 10*3/uL — ABNORMAL HIGH (ref 4.0–10.5)
nRBC: 0 % (ref 0.0–0.2)

## 2018-06-26 LAB — IRON AND TIBC
IRON: 40 ug/dL (ref 28–170)
Saturation Ratios: 13 % (ref 10.4–31.8)
TIBC: 305 ug/dL (ref 250–450)
UIBC: 265 ug/dL

## 2018-06-26 LAB — FERRITIN: Ferritin: 69 ng/mL (ref 11–307)

## 2018-06-27 ENCOUNTER — Other Ambulatory Visit: Payer: Self-pay

## 2018-06-29 ENCOUNTER — Inpatient Hospital Stay: Payer: Medicare Other

## 2018-06-29 ENCOUNTER — Inpatient Hospital Stay (HOSPITAL_BASED_OUTPATIENT_CLINIC_OR_DEPARTMENT_OTHER): Payer: Medicare Other | Admitting: Oncology

## 2018-06-29 ENCOUNTER — Other Ambulatory Visit: Payer: Self-pay

## 2018-06-29 ENCOUNTER — Encounter: Payer: Self-pay | Admitting: Oncology

## 2018-06-29 VITALS — BP 147/70 | HR 62

## 2018-06-29 VITALS — BP 173/78 | HR 58 | Temp 96.9°F | Wt 313.6 lb

## 2018-06-29 DIAGNOSIS — N183 Chronic kidney disease, stage 3 unspecified: Secondary | ICD-10-CM

## 2018-06-29 DIAGNOSIS — D509 Iron deficiency anemia, unspecified: Secondary | ICD-10-CM

## 2018-06-29 DIAGNOSIS — D631 Anemia in chronic kidney disease: Secondary | ICD-10-CM | POA: Diagnosis not present

## 2018-06-29 DIAGNOSIS — I5032 Chronic diastolic (congestive) heart failure: Secondary | ICD-10-CM

## 2018-06-29 DIAGNOSIS — I4891 Unspecified atrial fibrillation: Secondary | ICD-10-CM | POA: Diagnosis not present

## 2018-06-29 DIAGNOSIS — I130 Hypertensive heart and chronic kidney disease with heart failure and stage 1 through stage 4 chronic kidney disease, or unspecified chronic kidney disease: Secondary | ICD-10-CM

## 2018-06-29 DIAGNOSIS — E1122 Type 2 diabetes mellitus with diabetic chronic kidney disease: Secondary | ICD-10-CM

## 2018-06-29 DIAGNOSIS — Z7901 Long term (current) use of anticoagulants: Secondary | ICD-10-CM

## 2018-06-29 MED ORDER — IRON SUCROSE 20 MG/ML IV SOLN
200.0000 mg | Freq: Once | INTRAVENOUS | Status: AC
  Administered 2018-06-29: 200 mg via INTRAVENOUS
  Filled 2018-06-29: qty 10

## 2018-06-29 MED ORDER — SODIUM CHLORIDE 0.9 % IV SOLN: 200.0000 mg | Freq: Once | INTRAVENOUS | Status: DC

## 2018-06-29 MED ORDER — SODIUM CHLORIDE 0.9 % IV SOLN
Freq: Once | INTRAVENOUS | Status: AC
  Administered 2018-06-29: 11:00:00 via INTRAVENOUS
  Filled 2018-06-29: qty 250

## 2018-06-29 NOTE — Progress Notes (Signed)
Patient here today for follow up and iron infusion.  Patient states no new concerns today

## 2018-06-30 NOTE — Progress Notes (Signed)
Hematology/Oncology Follow up visit [facility] [phone] [phone]   Patient Care Team: Sofie Hartigan, MD as PCP - General (Family Medicine) Alisa Graff, FNP as Nurse Practitioner (Family Medicine) Ubaldo Glassing Javier Docker, MD as Consulting Physician (Cardiology) Elease Etienne, MD as Consulting Physician (Endocrinology)  CHIEF COMPLAINTS/REASON FOR VISIT Follow up for treatment of Anemia    HISTORY OF PRESENTING ILLNESS:  Andrea Rivers 62 y.o.  female with past medical history listed as below who was referred by Dr. Holley Raring to me for evaluation and management of anemia.  Patient was recently hospitalized due to acute respiratory failure secondary to diastolic CHF exacerbation. She was initially admitted to ICU, intubated and required mechanical ventilation. She also had acute on chronic kidney failure. Her recent labs showed microcytic anemia with hemoglobin  6.5, MCV 67.9. She received blood transfusion on 02/24/2017. Hemoglobin improved to 8.6. Patient reports fatigue, and a lack of energy. She has some lower extremity edema. She lives at home by herself and is able to do her ADLs. She takes warfarin for A. Fib. Denies any bleeding events, blood in the stool or black stool.  #  left distal femur comminuted fracture after mechanical fall, s/p ORIF done 11/17/17. Hemoglobin dropped to 7.7, s/p 2 units of PRBC transfusion.   INTERVAL HISTORY  Patient presents for follow up for management of chronic anemia. Reports doing well.  Fatigue is chronic at baseline.  Not worse.  Patient is on chronic anticoagulation for A. Fib. Denies hematochezia, hematuria, hematemesis, epistaxis, black tarry stool or easy bruising.  Chronic shortness of breath with exertion and chronic lower extremity swelling at baseline. Review of Systems  Constitutional: Positive for fatigue. Negative for appetite change, chills and fever.  HENT:   Negative for hearing loss,  lump/mass and voice change.   Eyes: Negative for eye problems.  Respiratory: Negative for chest tightness, cough and shortness of breath.   Cardiovascular: Positive for leg swelling. Negative for chest pain.  Gastrointestinal: Negative for abdominal distention, abdominal pain, blood in stool, constipation, diarrhea and nausea.  Endocrine: Negative for hot flashes.  Genitourinary: Negative for difficulty urinating, dysuria, frequency and hematuria.   Musculoskeletal: Negative for arthralgias, back pain, gait problem and neck pain.  Skin: Negative for itching and rash.  Neurological: Negative for dizziness, extremity weakness, gait problem and headaches.  Hematological: Negative for adenopathy. Does not bruise/bleed easily.  Psychiatric/Behavioral: Negative for confusion. The patient is not nervous/anxious.    MEDICAL HISTORY:  Past Medical History:  Diagnosis Date  . Anemia   . Anxiety   . CHF (congestive heart failure) ([place])   . Chronic kidney disease    INSUFFICIENCY  . Complication of anesthesia    had to be intubated during cataract surgery   " I could not breath "  . COPD (chronic obstructive pulmonary disease) ([place])   . Diabetes mellitus without complication ([place])   . Dyspnea    DOE  . Dysrhythmia    A FIB  . Edema   . Fracture of distal femur ([place]) 11/2017   left   . GERD (gastroesophageal reflux disease)   . History of kidney stones   . Hypertension   . Hypothyroidism    ABLATION  . Iron deficiency anemia 03/31/2017  . Neuropathy   . Orthopnea   . Sleep apnea    CPAP  . Vertigo   . Wheezing     SURGICAL HISTORY: Past Surgical History:  Procedure Laterality Date  .  CATARACT EXTRACTION W/PHACO Left 01/26/2017   Procedure: CATARACT EXTRACTION PHACO AND INTRAOCULAR LENS PLACEMENT (IOC);  Surgeon: Estill Cotta, MD;  Location: [facility] ORS;  Service: Ophthalmology;  Laterality: Left;  [place]   1:02.2 AP     23.7 CDE   28.92 fluid pack lot# [id number] H exp.05/08/2018  .  CHOLECYSTECTOMY    . ORIF FEMUR FRACTURE Left 11/17/2017   Procedure: OPEN REDUCTION INTERNAL FIXATION (ORIF) DISTAL FEMUR FRACTURE;  Surgeon: Shona Needles, MD;  Location: [facility];  Service: Orthopedics;  Laterality: Left;  . TONSILLECTOMY      SOCIAL HISTORY: Social History   Socioeconomic History  . Marital status: Married    Spouse name: Not on file  . Number of children: Not on file  . Years of education: Not on file  . Highest education level: Not on file  Occupational History  . Not on file  Social Needs  . Financial resource strain: Not on file  . Food insecurity:    Worry: Not on file    Inability: Not on file  . Transportation needs:    Medical: Not on file    Non-medical: Not on file  Tobacco Use  . Smoking status: Former Smoker    Packs/day: 3.00    Types: Cigarettes    Last attempt to quit: 1988    Years since quitting: 31.9  . Smokeless tobacco: Never Used  Substance and Sexual Activity  . Alcohol use: No  . Drug use: No  . Sexual activity: Not on file  Lifestyle  . Physical activity:    Days per week: Not on file    Minutes per session: Not on file  . Stress: Not on file  Relationships  . Social connections:    Talks on phone: Not on file    Gets together: Not on file    Attends religious service: Not on file    Active member of club or organization: Not on file    Attends meetings of clubs or organizations: Not on file    Relationship status: Not on file  . Intimate partner violence:    Fear of current or ex partner: Not on file    Emotionally abused: Not on file    Physically abused: Not on file    Forced sexual activity: Not on file  Other Topics Concern  . Not on file  Social History Narrative  . Not on file    FAMILY HISTORY: Family History  Problem Relation Age of Onset  . COPD Mother   . Heart disease Mother   . Anemia Mother   . Heart disease Father   . COPD Father   . Anemia Sister   . Diabetes Maternal Grandmother   .  Hypertension Paternal Grandfather     ALLERGIES:  is allergic to other; latex; exenatide; garlic; onion; rosiglitazone; and erythromycin.  MEDICATIONS:  Current Outpatient Medications  Medication Sig Dispense Refill  . albuterol (PROAIR HFA) 108 (90 Base) MCG/ACT inhaler inhale 2 puffs by mouth INTO LUNGS every 4 to 6 hours if needed for wheezing or shortness of breath    . albuterol (PROVENTIL) (2.5 MG/3ML) 0.083% nebulizer solution Inhale 3 mLs (2.5 mg total) into the lungs every 4 (four) hours as needed for wheezing or shortness of breath. 75 mL 12  . Amino Acids-Protein Hydrolys (FEEDING SUPPLEMENT, PRO-STAT SUGAR FREE 64,) LIQD Take 30 mLs by mouth 3 (three) times daily with meals.    Marland Kitchen amiodarone (PACERONE) 200 MG tablet Take 200 mg  by mouth daily.  0  . azelastine (ASTELIN) 0.1 % nasal spray Place 2 sprays into both nostrils 2 (two) times daily.  1  . Calcium Carbonate-Vitamin D3 (CALCIUM 600-D) 600-400 MG-UNIT TABS Take 1 tablet by mouth daily.    . Cholecalciferol (D3 HIGH POTENCY) 2000 units CAPS Take 4,000 Units by mouth daily.     . citalopram (CELEXA) 20 MG tablet Take 40 mg by mouth daily.     . clonazePAM (KLONOPIN) 1 MG tablet Take 1 tablet by mouth as needed.    . cloNIDine (CATAPRES) 0.1 MG tablet Take 1 tablet (0.1 mg total) by mouth 2 (two) times daily. 60 tablet 5  . co-enzyme Q-10 50 MG capsule Take 100 mg by mouth daily.     . cyclobenzaprine (FLEXERIL) 5 MG tablet Take 5 mg by mouth 3 (three) times daily as needed for muscle spasms.    . Fluticasone-Salmeterol (WIXELA INHUB) 250-50 MCG/DOSE AEPB Inhale 1 puff into the lungs 2 (two) times daily.    Marland Kitchen gabapentin (NEURONTIN) 100 MG capsule Take 100 mg by mouth 3 (three) times daily as needed (for nerve pain).     . Garlic 4098 MG CAPS Take 1,000 mg by mouth daily.     Marland Kitchen glimepiride (AMARYL) 2 MG tablet Take 2 mg by mouth daily with breakfast.    . HYDROcodone-acetaminophen (NORCO) 10-325 MG tablet Take 1 tablet by mouth  every 6 (six) hours as needed.    . Insulin Detemir (LEVEMIR FLEXTOUCH) 100 UNIT/ML Pen Inject into the skin.    Marland Kitchen insulin lispro (HUMALOG) 100 UNIT/ML injection Inject into the skin 3 (three) times daily before meals.    Marland Kitchen levothyroxine (SYNTHROID, LEVOTHROID) 175 MCG tablet Take 175 mcg by mouth every evening.     . linagliptin (TRADJENTA) 5 MG TABS tablet take 1 tablet by mouth once daily    . loratadine (CLARITIN) 10 MG tablet Take 1 tablet (10 mg total) by mouth daily. 30 tablet 0  . LORazepam (ATIVAN) 1 MG tablet Take 1 tablet (1 mg total) by mouth 2 (two) times daily. 20 tablet 0  . lovastatin (MEVACOR) 20 MG tablet Take 20 mg by mouth at bedtime.    . magnesium oxide (MAG-OX) 400 MG tablet Take 400 mg by mouth 2 (two) times daily.    . meclizine (ANTIVERT) 12.5 MG tablet Take 1 tablet (12.5 mg total) by mouth 3 (three) times daily as needed for dizziness. 30 tablet 0  . metolazone (ZAROXOLYN) 5 MG tablet Take 5 mg by mouth as needed (for a weight gain of 2 pounds overnight or 5 pounds in a week; MAX OF 5 TABLETS AT A TIME).     . Multiple Vitamins-Minerals (CENTRUM WOMEN) TABS Take 1 tablet by mouth daily.     . Omega-3 Fatty Acids (FISH OIL) 1000 MG CPDR Take 1 capsule by mouth daily.    . pantoprazole (PROTONIX) 40 MG tablet Take 40 mg by mouth 2 (two) times daily.    . polyethylene glycol (MIRALAX / GLYCOLAX) packet Take 17 g by mouth 2 (two) times daily. 14 each 0  . potassium chloride SA (K-DUR,KLOR-CON) 20 MEQ tablet Take 2 tablets (40 mEq total) by mouth daily. 180 tablet 3  . senna-docusate (SENOKOT-S) 8.6-50 MG tablet Take 1 tablet by mouth at bedtime as needed for mild constipation. 30 tablet 0  . sodium phosphate (FLEET) 7-19 GM/118ML ENEM Place 133 mLs (1 enema total) rectally once as needed for severe constipation. 1 enema 0  .  torsemide (DEMADEX) 20 MG tablet Take 2 tablets (40 mg total) by mouth 2 (two) times daily. 360 tablet 3  . vitamin C (ASCORBIC ACID) 500 MG tablet  Take 500 mg by mouth daily.    Marland Kitchen warfarin (COUMADIN) 1 MG tablet Take 1 mg daily at 6 PM by mouth.     . warfarin (COUMADIN) 5 MG tablet Take 5 mg by mouth daily at 6 PM.   0  . zinc sulfate 220 (50 Zn) MG capsule Take 220 mg by mouth daily.     No current facility-administered medications for this visit.       Marland Kitchen  PHYSICAL EXAMINATION: ECOG PERFORMANCE STATUS: 2 - Symptomatic, <50% confined to bed Vitals:   06/29/18 1058  BP: (!) 173/78  Pulse: (!) 58  Temp: (!) 96.9 F (36.1 C)   Filed Weights   06/29/18 1058  Weight: (!) 313 lb 9.7 oz (142.3 kg)   Physical Exam  Constitutional: She is oriented to person, place, and time. No distress.  Walks with walker, morbidly obese.   HENT:  Head: Normocephalic and atraumatic.  Nose: Nose normal.  Mouth/Throat: Oropharynx is clear and moist. No oropharyngeal exudate.  Eyes: Pupils are equal, round, and reactive to light. Conjunctivae and EOM are normal. Right eye exhibits no discharge. Left eye exhibits no discharge. No scleral icterus.  Neck: Normal range of motion. Neck supple. No JVD present.  Cardiovascular: Normal rate, regular rhythm and normal heart sounds.  No murmur heard. Pulmonary/Chest: Effort normal and breath sounds normal. No respiratory distress. She has no wheezes. She has no rales. She exhibits no tenderness.  Abdominal: Soft. Bowel sounds are normal. She exhibits no distension and no mass. There is no tenderness. There is no rebound.  Musculoskeletal: Normal range of motion. She exhibits no edema or tenderness.  Chronic 1+ edema,    Lymphadenopathy:    She has no cervical adenopathy.  Neurological: She is alert and oriented to person, place, and time. No cranial nerve deficit. She exhibits normal muscle tone. Coordination normal.  Skin: Skin is warm and dry. No rash noted. She is not diaphoretic. No erythema.  Bilateral LE vein insufficiency skin changes.   Psychiatric: Affect and judgment normal.      LABORATORY DATA:  I have reviewed the data as listed CBC Latest Ref Rng & Units 06/26/2018 05/09/2018 03/27/2018  WBC 4.0 - 10.5 K/uL 11.7(H) 15.6(H) 10.9  Hemoglobin 12.0 - 15.0 g/dL 11.5(L) 11.4(L) 11.5(L)  Hematocrit 36.0 - 46.0 % 37.9 34.7(L) 36.6  Platelets 150 - 400 K/uL 216 266 230   Recent Labs    11/20/17 0543 11/21/17 0421  02/06/18 1113 05/09/18 1735 05/10/18 0059  NA 138 138   < > 140 140 139  K 3.8 3.8   < > 3.3* 2.9* 3.3*  CL 94* 94*   < > 93* 88* 90*  CO2 32 32   < > 35* 38* 38*  GLUCOSE 220* 234*   < > 268* 206* 139*  BUN 47* 42*   < > 43* 65* 57*  CREATININE 1.54* 1.32*   < > 1.79* 1.56* 1.45*  CALCIUM 8.5* 8.4*   < > 9.1 9.0 9.1  GFRNONAA 35* 42*   < > 29* 35* 38*  GFRAA 41* 49*   < > 34* 40* 44*  PROT 6.3* 6.5  --   --  7.9  --   ALBUMIN 2.3* 2.3*  --   --  3.6  --   AST 19  23  --   --  30  --   ALT 6* 8*  --   --  17  --   ALKPHOS 78 74  --   --  84  --   BILITOT 1.2 1.1  --   --  0.8  --    < > = values in this interval not displayed.     Labs obtained at the nephrologist office showed Peripheral blood Protein electrophoresis revealed no monoclonal spike. Urine protein electrophoresis negative for monoclonal spike. Hemoglobin 7.9, WBC 12.1, MCV 72, RDW 19, platelet 283,000, neutrophil 9.2, lymphocyte 1.6, monocyte 1.0, a ANA negative.  ASSESSMENT & PLAN:  1. Anemia of chronic kidney failure, stage 3 (moderate) ([place])   2. Iron deficiency anemia, unspecified iron deficiency anemia type    #Labs reviewed and discussed with patient.  Both ferritin and hemoglobin level have been stable.  Iron panel showed borderline iron saturation. Given that patient has chronic kidney disease, on anticoagulation with possible chronic GI blood loss, history of iron deficiency anemia, I would recommend 1 dose of IV Venofer to stabilize her iron store. Patient previously denied GI referral for work-up  . Orders Placed This Encounter  Procedures  . CBC with  Differential/Platelet    Standing Status:   Future    Standing Expiration Date:   06/30/2019  . Iron and TIBC    Standing Status:   Future    Standing Expiration Date:   06/30/2019  . Ferritin    Standing Status:   Future    Standing Expiration Date:   06/30/2019    Return of visit: 3 months.  with CBC, TIBC, ferritin done one or 2 days prior to, possible Venofer.    Earlie Server, MD, PhD Hematology Oncology [facility] Pager- [phone] 06/30/2018

## 2018-07-11 NOTE — Progress Notes (Signed)
Patient ID: Andrea Rivers, female    DOB: 11/25/1955, 62 y.o.   MRN: [id number]  HPI  Andrea Rivers is a 62 y/o female with a history of anemia, anxiety, CKD, COPD, DM, GERD, HTN, hypothyroidism, neuropathy, obstructive sleep apnea (+CPAP), previous tobacco use and chronic heart failure.  Echo report from 02/23/17 reviewed and showed an EF of 55-60% along with mild MR. Echo done 01/26/17 shows an EF of 50-55%.   Was in the ED 05/09/18 due to constipation. Fecal disimpaction and enema given. IV potassium/magnesium given and she was released.     She presents today for a follow-up visit with a chief complaint of minimal shortness of breath upon moderate exertion. She says that this has been chronic in nature having been present for several years. She has associated fatigue, pedal edema, light-headedness and slight weight gain along with this. She denies any difficulty sleeping, abdominal distention, palpitations, chest pain or cough. She continues to have quite a bit of edema in her legs.   Past Medical History:  Diagnosis Date  . Anemia   . Anxiety   . CHF (congestive heart failure) ([place])   . Chronic kidney disease    INSUFFICIENCY  . Complication of anesthesia    had to be intubated during cataract surgery   " I could not breath "  . COPD (chronic obstructive pulmonary disease) ([place])   . Diabetes mellitus without complication ([place])   . Dyspnea    DOE  . Dysrhythmia    A FIB  . Edema   . Fracture of distal femur ([place]) 11/2017   left   . GERD (gastroesophageal reflux disease)   . History of kidney stones   . Hypertension   . Hypothyroidism    ABLATION  . Iron deficiency anemia 03/31/2017  . Neuropathy   . Orthopnea   . Sleep apnea    CPAP  . Vertigo   . Wheezing    Past Surgical History:  Procedure Laterality Date  . CATARACT EXTRACTION W/PHACO Left 01/26/2017   Procedure: CATARACT EXTRACTION PHACO AND INTRAOCULAR LENS PLACEMENT (IOC);  Surgeon: Estill Cotta, MD;   Location: [facility];  Service: Ophthalmology;  Laterality: Left;  [place]   1:02.2 AP     23.7 CDE   28.92 fluid pack lot# [id number] H exp.05/08/2018  . CHOLECYSTECTOMY    . ORIF FEMUR FRACTURE Left 11/17/2017   Procedure: OPEN REDUCTION INTERNAL FIXATION (ORIF) DISTAL FEMUR FRACTURE;  Surgeon: Shona Needles, MD;  Location: [place];  Service: Orthopedics;  Laterality: Left;  . TONSILLECTOMY     Family History  Problem Relation Age of Onset  . COPD Mother   . Heart disease Mother   . Anemia Mother   . Heart disease Father   . COPD Father   . Anemia Sister   . Diabetes Maternal Grandmother   . Hypertension Paternal Grandfather    Social History   Tobacco Use  . Smoking status: Former Smoker    Packs/day: 3.00    Types: Cigarettes    Last attempt to quit: 1988    Years since quitting: 31.9  . Smokeless tobacco: Never Used  Substance Use Topics  . Alcohol use: No   Allergies  Allergen Reactions  . Other Other (See Comments)    ILOSONE- Caused GI distress also  . Latex Hives  . Exenatide Nausea Only and Other (See Comments)    [place]- Nausea and abdominal pain, also  . Garlic Other (See Comments)    Severe  acid reflux  . Onion Other (See Comments)    Severe acid reflux  . Rosiglitazone Other (See Comments)    [place]- Affected heart  . Erythromycin Nausea And Vomiting    GI DISTRESS   Prior to Admission medications   Medication Sig Start Date End Date Taking? Authorizing Provider  acetaminophen (TYLENOL) 500 MG tablet Take 1,000 mg by mouth 2 (two) times daily as needed.   Yes [provider]  albuterol (PROAIR HFA) 108 (90 Base) MCG/ACT inhaler inhale 2 puffs by mouth INTO LUNGS every 4 to 6 hours if needed for wheezing or shortness of breath 03/14/17  Yes [provider]  amiodarone (PACERONE) 200 MG tablet Take 200 mg by mouth daily. 05/10/17  Yes [provider]  azelastine (ASTELIN) 0.1 % nasal spray Place 2 sprays into both nostrils 2 (two) times  daily as needed.  10/24/17  Yes [provider]  Calcium Carbonate-Vitamin D3 (CALCIUM 600-D) 600-400 MG-UNIT TABS Take 1 tablet by mouth daily.   Yes [provider]  Cholecalciferol (D3 HIGH POTENCY) 125 MCG (5000 UT) capsule Take 5,000 Units by mouth daily.    Yes [provider]  citalopram (CELEXA) 20 MG tablet Take 40 mg by mouth daily.    Yes [provider]  clonazePAM (KLONOPIN) 1 MG tablet Take 1 tablet by mouth 2 (two) times daily as needed.  05/17/18  Yes [provider]  cloNIDine (CATAPRES) 0.1 MG tablet Take 1 tablet (0.1 mg total) by mouth 2 (two) times daily. 07/12/17  Yes Ray Gervasi, Otila Kluver A, FNP  Co-Enzyme Q-10 100 MG CAPS Take 100 mg by mouth daily.    Yes [provider]  docusate sodium (COLACE) 50 MG capsule Take 100 mg by mouth 2 (two) times daily.   Yes [provider]  Fluticasone-Salmeterol (WIXELA INHUB) 250-50 MCG/DOSE AEPB Inhale 1 puff into the lungs 2 (two) times daily.   Yes [provider]  gabapentin (NEURONTIN) 100 MG capsule Take 100 mg by mouth 3 (three) times daily.  09/05/17 09/05/18 Yes [provider]  Garlic 7824 MG CAPS Take 1,000 mg by mouth daily.    Yes [provider]  glimepiride (AMARYL) 2 MG tablet Take 2 mg by mouth daily with breakfast.   Yes [provider]  HYDROcodone-acetaminophen (NORCO) 10-325 MG tablet Take 1 tablet by mouth every 6 (six) hours as needed.   Yes [provider]  Insulin Detemir (LEVEMIR FLEXTOUCH) 100 UNIT/ML Pen Inject 23 Units into the skin at bedtime.  03/01/18  Yes [provider]  insulin lispro (HUMALOG) 100 UNIT/ML injection Inject into the skin 3 (three) times daily before meals.   Yes [provider]  levothyroxine (SYNTHROID, LEVOTHROID) 175 MCG tablet Take 175 mcg by mouth every evening.    Yes [provider]  linagliptin (TRADJENTA) 5 MG TABS tablet take 1 tablet by mouth once daily 01/03/18   Yes [provider]  loratadine (CLARITIN) 10 MG tablet Take 1 tablet (10 mg total) by mouth daily. 11/22/17  Yes Sheikh, Omair Latif, DO  lovastatin (MEVACOR) 20 MG tablet Take 20 mg by mouth at bedtime.   Yes [provider]  magnesium oxide (MAG-OX) 400 MG tablet Take 400 mg by mouth 2 (two) times daily.   Yes [provider]  meclizine (ANTIVERT) 12.5 MG tablet Take 1 tablet (12.5 mg total) by mouth 3 (three) times daily as needed for dizziness. 03/03/17  Yes Gladstone Lighter, MD  metolazone (ZAROXOLYN) 5 MG  tablet Take 5 mg by mouth as needed (for a weight gain of 2 pounds overnight or 5 pounds in a week; MAX OF 5 TABLETS AT A TIME).  04/18/17  Yes [provider]  Multiple Vitamins-Minerals (CENTRUM WOMEN) TABS Take 1 tablet by mouth daily.    Yes [provider]  Omega-3 Fatty Acids (FISH OIL) 1000 MG CPDR Take 1 capsule by mouth daily.   Yes [provider]  pantoprazole (PROTONIX) 40 MG tablet Take 40 mg by mouth 2 (two) times daily as needed.    Yes [provider]  potassium chloride SA (K-DUR,KLOR-CON) 20 MEQ tablet Take 2 tablets (40 mEq total) by mouth daily. 06/07/18  Yes Sina Lucchesi A, FNP  senna (SENOKOT) 8.6 MG TABS tablet Take 2 tablets by mouth 2 (two) times daily.   Yes [provider]  torsemide (DEMADEX) 20 MG tablet Take 2 tablets (40 mg total) by mouth 2 (two) times daily. 09/09/17  Yes Summit Borchardt, Otila Kluver A, FNP  vitamin C (ASCORBIC ACID) 500 MG tablet Take 500 mg by mouth daily.   Yes [provider]  warfarin (COUMADIN) 5 MG tablet Take 5 mg by mouth daily at 6 PM.  04/22/17  Yes [provider]  albuterol (PROVENTIL) (2.5 MG/3ML) 0.083% nebulizer solution Inhale 3 mLs (2.5 mg total) into the lungs every 4 (four) hours as needed for wheezing or shortness of breath. Patient not taking: Reported on 07/12/2018 11/21/17   Raiford Noble Latif, DO  Amino Acids-Protein Hydrolys (FEEDING SUPPLEMENT,  PRO-STAT SUGAR FREE 64,) LIQD Take 30 mLs by mouth 3 (three) times daily with meals.    [provider]  cyclobenzaprine (FLEXERIL) 5 MG tablet Take 5 mg by mouth 3 (three) times daily as needed for muscle spasms.    [provider]  LORazepam (ATIVAN) 1 MG tablet Take 1 tablet (1 mg total) by mouth 2 (two) times daily. Patient not taking: Reported on 07/12/2018 02/06/18 02/06/19  Earleen Newport, MD  polyethylene glycol [facility] / Floria Raveling) packet Take 17 g by mouth 2 (two) times daily. Patient not taking: Reported on 07/12/2018 11/21/17   Raiford Noble Latif, DO  senna-docusate (SENOKOT-S) 8.6-50 MG tablet Take 1 tablet by mouth at bedtime as needed for mild constipation. Patient not taking: Reported on 07/12/2018 11/21/17   Raiford Noble Latif, DO  sodium phosphate (FLEET) 7-19 GM/118ML ENEM Place 133 mLs (1 enema total) rectally once as needed for severe constipation. Patient not taking: Reported on 07/12/2018 11/21/17   Raiford Noble Latif, DO  warfarin (COUMADIN) 1 MG tablet Take 1 mg daily at 6 PM by mouth.     [provider]  zinc sulfate 220 (50 Zn) MG capsule Take 220 mg by mouth daily.    [provider]     Review of Systems  Constitutional: Positive for fatigue. Negative for appetite change.  HENT: Negative for congestion, postnasal drip and sore throat.   Eyes: Positive for visual disturbance (blurry vision in left eye). Negative for pain.  Respiratory: Positive for shortness of breath. Negative for cough and chest tightness.   Cardiovascular: Positive for leg swelling ("better"). Negative for chest pain and palpitations.  Gastrointestinal: Negative for abdominal distention and abdominal pain.  Endocrine: Negative.   Genitourinary: Negative.   Musculoskeletal: Positive for arthralgias (left knee) and back pain.  Skin: Negative.   Allergic/Immunologic: Negative.   Neurological: Positive for light-headedness (due to vertigo). Negative for  dizziness.  Hematological: Negative for adenopathy. Bruises/bleeds easily.  Psychiatric/Behavioral:  Positive for dysphoric mood. Negative for sleep disturbance (sleeping on 6 pillows due to comfort). The patient is not nervous/anxious.    Vitals:   07/12/18 0941  BP: (!) 142/78  Pulse: 77  Resp: 18  SpO2: 96%  Weight: (!) 319 lb 4 oz (144.8 kg)  Height: 5\' 5"  (1.651 m)   Wt Readings from Last 3 Encounters:  07/12/18 (!) 319 lb 4 oz (144.8 kg)  06/29/18 (!) 313 lb 9.7 oz (142.3 kg)  06/07/18 (!) 318 lb (144.2 kg)   Lab Results  Component Value Date   CREATININE 1.45 (H) 05/10/2018   CREATININE 1.56 (H) 05/09/2018   CREATININE 1.79 (H) 02/06/2018    Physical Exam  Constitutional: She is oriented to person, place, and time. She appears well-developed and well-nourished.  HENT:  Head: Normocephalic and atraumatic.  Neck: Normal range of motion. Neck supple. No JVD present.  Cardiovascular: Normal rate and regular rhythm.  Pulmonary/Chest: Effort normal. She has no wheezes. She has no rales.  Abdominal: Soft. She exhibits no distension. There is no tenderness.  Musculoskeletal: She exhibits edema (2+ pitting edema in bilateral lower legs). She exhibits no tenderness.  Neurological: She is alert and oriented to person, place, and time.  Skin: Skin is warm and dry.  Psychiatric: She has a normal mood and affect. Her behavior is normal. Thought content normal.  Nursing note and vitals reviewed.   Assessment & Plan:  1: Chronic heart failure with preserved ejection fraction- - NYHA class II - euvolemic today - reminded to call for an overnight weight gain of >2 pounds or a weekly weight gain of >5 pounds - weight unchanged from last visit 5 weeks ago - not adding salt to her food  - saw cardiologist Ubaldo Glassing) 04/27/18 - currently receiving PT twice a week - does not meet ReDS vest criteria due to BMI - BNP 02/06/18 was 130.0 - reports receiving her flu vaccine for this season -  PharmD reconciled medications with the patient  2: HTN- - BP looks good today - BMP 05/10/18 reviewed and showed sodium 139, potassium 3.3, creatinine 1.45 and GFR 38 - saw PCP Wadie Lessen) 05/17/18  3: Diabetes- - fasting glucose yesterday was 290 - A1c from 09/20/17 was 7.3% - saw endocrinologist Honor Junes) 04/14/18  4: Hypokalemia- - has been taking 44meq potassium daily - BMP was not checked on 06/26/18; offered to check it today but she says that she's getting her PT/INR checked either today or tomorrow. RX for BMP written for patient to hand to the lab so that she can only get stuck one time  5: Lymphedema- - stage 2 - elevating them at times with some improvement; instructed to keep them elevated when sitting for long periods of time - unable to exercise much due to knee pain but does try to walk with her walker - unable to wear compression socks due to inability to get them on - consider lymphapress compression boots if symptoms persist  Patient did not bring her medications nor a list. Each medication was verbally reviewed with the patient and she was encouraged to bring the bottles to every visit to confirm accuracy of list.  Return in 6 months or sooner for any questions/problems before then.

## 2018-07-12 ENCOUNTER — Ambulatory Visit: Payer: Medicare Other | Attending: Family | Admitting: Family

## 2018-07-12 ENCOUNTER — Encounter: Payer: Self-pay | Admitting: Family

## 2018-07-12 VITALS — BP 142/78 | HR 77 | Resp 18 | Ht 65.0 in | Wt 319.2 lb

## 2018-07-12 DIAGNOSIS — Z794 Long term (current) use of insulin: Secondary | ICD-10-CM | POA: Insufficient documentation

## 2018-07-12 DIAGNOSIS — I130 Hypertensive heart and chronic kidney disease with heart failure and stage 1 through stage 4 chronic kidney disease, or unspecified chronic kidney disease: Secondary | ICD-10-CM | POA: Diagnosis not present

## 2018-07-12 DIAGNOSIS — E876 Hypokalemia: Secondary | ICD-10-CM

## 2018-07-12 DIAGNOSIS — Z8249 Family history of ischemic heart disease and other diseases of the circulatory system: Secondary | ICD-10-CM | POA: Insufficient documentation

## 2018-07-12 DIAGNOSIS — N183 Chronic kidney disease, stage 3 unspecified: Secondary | ICD-10-CM

## 2018-07-12 DIAGNOSIS — I5032 Chronic diastolic (congestive) heart failure: Secondary | ICD-10-CM | POA: Insufficient documentation

## 2018-07-12 DIAGNOSIS — I4891 Unspecified atrial fibrillation: Secondary | ICD-10-CM | POA: Diagnosis not present

## 2018-07-12 DIAGNOSIS — Z87442 Personal history of urinary calculi: Secondary | ICD-10-CM | POA: Diagnosis not present

## 2018-07-12 DIAGNOSIS — Z79890 Hormone replacement therapy (postmenopausal): Secondary | ICD-10-CM | POA: Insufficient documentation

## 2018-07-12 DIAGNOSIS — F419 Anxiety disorder, unspecified: Secondary | ICD-10-CM | POA: Insufficient documentation

## 2018-07-12 DIAGNOSIS — E1140 Type 2 diabetes mellitus with diabetic neuropathy, unspecified: Secondary | ICD-10-CM | POA: Insufficient documentation

## 2018-07-12 DIAGNOSIS — G4733 Obstructive sleep apnea (adult) (pediatric): Secondary | ICD-10-CM | POA: Diagnosis not present

## 2018-07-12 DIAGNOSIS — I890 Lymphedema, not elsewhere classified: Secondary | ICD-10-CM | POA: Diagnosis not present

## 2018-07-12 DIAGNOSIS — N189 Chronic kidney disease, unspecified: Secondary | ICD-10-CM | POA: Diagnosis not present

## 2018-07-12 DIAGNOSIS — K219 Gastro-esophageal reflux disease without esophagitis: Secondary | ICD-10-CM | POA: Diagnosis not present

## 2018-07-12 DIAGNOSIS — Z7901 Long term (current) use of anticoagulants: Secondary | ICD-10-CM | POA: Insufficient documentation

## 2018-07-12 DIAGNOSIS — E1122 Type 2 diabetes mellitus with diabetic chronic kidney disease: Secondary | ICD-10-CM | POA: Diagnosis not present

## 2018-07-12 DIAGNOSIS — Z79899 Other long term (current) drug therapy: Secondary | ICD-10-CM | POA: Insufficient documentation

## 2018-07-12 DIAGNOSIS — E039 Hypothyroidism, unspecified: Secondary | ICD-10-CM | POA: Diagnosis not present

## 2018-07-12 DIAGNOSIS — Z881 Allergy status to other antibiotic agents status: Secondary | ICD-10-CM | POA: Diagnosis not present

## 2018-07-12 DIAGNOSIS — Z87891 Personal history of nicotine dependence: Secondary | ICD-10-CM | POA: Diagnosis not present

## 2018-07-12 DIAGNOSIS — D649 Anemia, unspecified: Secondary | ICD-10-CM | POA: Insufficient documentation

## 2018-07-12 DIAGNOSIS — I10 Essential (primary) hypertension: Secondary | ICD-10-CM

## 2018-07-12 DIAGNOSIS — J449 Chronic obstructive pulmonary disease, unspecified: Secondary | ICD-10-CM | POA: Insufficient documentation

## 2018-07-12 NOTE — Patient Instructions (Signed)
Continue weighing daily and call for an overnight weight gain of > 2 pounds or a weekly weight gain of >5 pounds. 

## 2018-08-09 ENCOUNTER — Emergency Department
Admission: EM | Admit: 2018-08-09 | Discharge: 2018-08-09 | Disposition: A | Discharge status: Home / Self Care | Payer: Medicare Other | Attending: Emergency Medicine | Admitting: Emergency Medicine

## 2018-08-09 ENCOUNTER — Other Ambulatory Visit: Payer: Self-pay

## 2018-08-09 ENCOUNTER — Emergency Department: Payer: Medicare Other

## 2018-08-09 DIAGNOSIS — Z7901 Long term (current) use of anticoagulants: Secondary | ICD-10-CM | POA: Insufficient documentation

## 2018-08-09 DIAGNOSIS — Z79899 Other long term (current) drug therapy: Secondary | ICD-10-CM | POA: Diagnosis not present

## 2018-08-09 DIAGNOSIS — I509 Heart failure, unspecified: Secondary | ICD-10-CM | POA: Insufficient documentation

## 2018-08-09 DIAGNOSIS — Z9049 Acquired absence of other specified parts of digestive tract: Secondary | ICD-10-CM | POA: Insufficient documentation

## 2018-08-09 DIAGNOSIS — E1122 Type 2 diabetes mellitus with diabetic chronic kidney disease: Secondary | ICD-10-CM | POA: Diagnosis not present

## 2018-08-09 DIAGNOSIS — E039 Hypothyroidism, unspecified: Secondary | ICD-10-CM | POA: Diagnosis not present

## 2018-08-09 DIAGNOSIS — J449 Chronic obstructive pulmonary disease, unspecified: Secondary | ICD-10-CM | POA: Diagnosis not present

## 2018-08-09 DIAGNOSIS — Z91040 Latex allergy status: Secondary | ICD-10-CM | POA: Diagnosis not present

## 2018-08-09 DIAGNOSIS — R002 Palpitations: Secondary | ICD-10-CM | POA: Diagnosis not present

## 2018-08-09 DIAGNOSIS — R000 Tachycardia, unspecified: Secondary | ICD-10-CM | POA: Diagnosis present

## 2018-08-09 DIAGNOSIS — I130 Hypertensive heart and chronic kidney disease with heart failure and stage 1 through stage 4 chronic kidney disease, or unspecified chronic kidney disease: Secondary | ICD-10-CM | POA: Diagnosis not present

## 2018-08-09 DIAGNOSIS — N189 Chronic kidney disease, unspecified: Secondary | ICD-10-CM | POA: Insufficient documentation

## 2018-08-09 DIAGNOSIS — Z794 Long term (current) use of insulin: Secondary | ICD-10-CM | POA: Insufficient documentation

## 2018-08-09 DIAGNOSIS — F419 Anxiety disorder, unspecified: Secondary | ICD-10-CM | POA: Insufficient documentation

## 2018-08-09 DIAGNOSIS — Z87891 Personal history of nicotine dependence: Secondary | ICD-10-CM | POA: Insufficient documentation

## 2018-08-09 LAB — CBC
HCT: 39.2 % (ref 36.0–46.0)
Hemoglobin: 11.9 g/dL — ABNORMAL LOW (ref 12.0–15.0)
MCH: 26.3 pg (ref 26.0–34.0)
MCHC: 30.4 g/dL (ref 30.0–36.0)
MCV: 86.5 fL (ref 80.0–100.0)
NRBC: 0 % (ref 0.0–0.2)
PLATELETS: 255 10*3/uL (ref 150–400)
RBC: 4.53 MIL/uL (ref 3.87–5.11)
RDW: 17.9 % — AB (ref 11.5–15.5)
WBC: 10.9 10*3/uL — AB (ref 4.0–10.5)

## 2018-08-09 LAB — BASIC METABOLIC PANEL
ANION GAP: 10 (ref 5–15)
BUN: 39 mg/dL — ABNORMAL HIGH (ref 8–23)
CALCIUM: 9.1 mg/dL (ref 8.9–10.3)
CO2: 36 mmol/L — ABNORMAL HIGH (ref 22–32)
Chloride: 94 mmol/L — ABNORMAL LOW (ref 98–111)
Creatinine, Ser: 1.94 mg/dL — ABNORMAL HIGH (ref 0.44–1.00)
GFR calc Af Amer: 31 mL/min — ABNORMAL LOW (ref >60)
GFR, EST NON AFRICAN AMERICAN: 27 mL/min — AB (ref >60)
GLUCOSE: 146 mg/dL — AB (ref 70–99)
Potassium: 3.7 mmol/L (ref 3.5–5.1)
SODIUM: 140 mmol/L (ref 135–145)

## 2018-08-09 LAB — PROTIME-INR
INR: 2.39
PROTHROMBIN TIME: 25.7 s — AB (ref 11.4–15.2)

## 2018-08-09 LAB — BRAIN NATRIURETIC PEPTIDE: B Natriuretic Peptide: 173 pg/mL — ABNORMAL HIGH (ref 0.0–100.0)

## 2018-08-09 LAB — TROPONIN I: Troponin I: 0.03 ng/mL (ref <0.03)

## 2018-08-09 LAB — MAGNESIUM: MAGNESIUM: 2.2 mg/dL (ref 1.7–2.4)

## 2018-08-09 LAB — TSH: TSH: 2.298 u[IU]/mL (ref 0.350–4.500)

## 2018-08-09 NOTE — ED Notes (Signed)
Date and time results received: 08/09/18 1955 (use smartphrase ".now" to insert current time)  Test: troponins  Critical Value: 0.03  Name of Provider Notified: quale  Orders Received? Or Actions Taken?: no new orders

## 2018-08-09 NOTE — ED Provider Notes (Signed)
[facility] Emergency Department Provider Note   ____________________________________________   First MD Initiated Contact with Patient 08/09/18 1843     (approximate)  I have reviewed the triage vital signs and the nursing notes.   HISTORY  Chief Complaint Palpitations    HPI Andrea Rivers is a 63 y.o. female here for evaluation of rapid heartbeat she experienced earlier  Patient reports when she got up today she noticed that for a little while and her heart was acting a little strangely, she felt a little bit of a feeling of slight shortness of breath with it and felt like her heart was beating intermittently quickly.  It went away, and then she went to lunch with friends and had another episode while she was there.  She feels better now.  She did notice her last couple of days that she has had some slight increase in her lower leg swelling which happens from time to time, she took a extra dose of her water pill and also her Zaroxolyn yesterday.  She is urinated well.  She reports she felt a little short of breath earlier but this is gone away.  Denies any fevers or chills.  No productive cough.  Does report in the past when she is had episodes which she thinks this is "A. fib"'s been related to low potassium and wanted to check to make sure that there does not appear to be any issues today.  She reports she takes amiodarone daily for A. fib, also takes Coumadin daily for same.   She follows with cardiology, Dr. Ubaldo Glassing   Past Medical History:  Diagnosis Date  . Anemia   . Anxiety   . CHF (congestive heart failure) ([place])   . Chronic kidney disease    INSUFFICIENCY  . Complication of anesthesia    had to be intubated during cataract surgery   " I could not breath "  . COPD (chronic obstructive pulmonary disease) ([place])   . Diabetes mellitus without complication ([place])   . Dyspnea    DOE  . Dysrhythmia    A FIB  . Edema   . Fracture of distal  femur ([place]) 11/2017   left   . GERD (gastroesophageal reflux disease)   . History of kidney stones   . Hypertension   . Hypothyroidism    ABLATION  . Iron deficiency anemia 03/31/2017  . Neuropathy   . Orthopnea   . Sleep apnea    CPAP  . Vertigo   . Wheezing     Patient Active Problem List   Diagnosis Date Noted  . Hypokalemia 06/08/2018  . Lymphedema 06/08/2018  . Closed displaced supracondylar fracture of distal end of left femur with intracondylar extension ([place]) 11/17/2017  . CKD (chronic kidney disease) 11/14/2017  . Hypothyroidism 11/14/2017  . Atrial fibrillation ([place]) 11/14/2017  . Anxiety state 11/14/2017  . Peripheral neuropathy 11/14/2017  . Fracture 11/14/2017  . Bradycardia 08/13/2017  . Microcytic anemia 03/31/2017  . Iron deficiency anemia 03/31/2017  . Chronic diastolic heart failure ([place]) 03/10/2017  . HTN (hypertension) 03/10/2017  . Diabetes ([place]) 03/10/2017  . Obstructive sleep apnea 03/10/2017  . Shock [facility])     Past Surgical History:  Procedure Laterality Date  . CATARACT EXTRACTION W/PHACO Left 01/26/2017   Procedure: CATARACT EXTRACTION PHACO AND INTRAOCULAR LENS PLACEMENT (IOC);  Surgeon: Estill Cotta, MD;  Location: [facility] ORS;  Service: Ophthalmology;  Laterality: Left;  [place]   1:02.2 AP  23.7 CDE   28.92 fluid pack lot# [id number] H exp.05/08/2018  . CHOLECYSTECTOMY    . ORIF FEMUR FRACTURE Left 11/17/2017   Procedure: OPEN REDUCTION INTERNAL FIXATION (ORIF) DISTAL FEMUR FRACTURE;  Surgeon: Shona Needles, MD;  Location: [place];  Service: Orthopedics;  Laterality: Left;  . TONSILLECTOMY      Prior to Admission medications   Medication Sig Start Date End Date Taking? Authorizing Provider  acetaminophen (TYLENOL) 500 MG tablet Take 1,000 mg by mouth 2 (two) times daily as needed.    [provider]  albuterol (PROAIR HFA) 108 (90 Base) MCG/ACT inhaler inhale 2 puffs by mouth INTO LUNGS every 4 to 6 hours if needed for wheezing or  shortness of breath 03/14/17   [provider]  albuterol (PROVENTIL) (2.5 MG/3ML) 0.083% nebulizer solution Inhale 3 mLs (2.5 mg total) into the lungs every 4 (four) hours as needed for wheezing or shortness of breath. Patient not taking: Reported on 07/12/2018 11/21/17   Raiford Noble Latif, DO  Amino Acids-Protein Hydrolys (FEEDING SUPPLEMENT, PRO-STAT SUGAR FREE 64,) LIQD Take 30 mLs by mouth 3 (three) times daily with meals.    [provider]  amiodarone (PACERONE) 200 MG tablet Take 200 mg by mouth daily. 05/10/17   [provider]  azelastine (ASTELIN) 0.1 % nasal spray Place 2 sprays into both nostrils 2 (two) times daily as needed.  10/24/17   [provider]  Calcium Carbonate-Vitamin D3 (CALCIUM 600-D) 600-400 MG-UNIT TABS Take 1 tablet by mouth daily.    [provider]  Cholecalciferol (D3 HIGH POTENCY) 125 MCG (5000 UT) capsule Take 5,000 Units by mouth daily.     [provider]  citalopram (CELEXA) 20 MG tablet Take 40 mg by mouth daily.     [provider]  clonazePAM (KLONOPIN) 1 MG tablet Take 1 tablet by mouth 2 (two) times daily as needed.  05/17/18   [provider]  cloNIDine (CATAPRES) 0.1 MG tablet Take 1 tablet (0.1 mg total) by mouth 2 (two) times daily. 07/12/17   Alisa Graff, FNP  Co-Enzyme Q-10 100 MG CAPS Take 100 mg by mouth daily.     [provider]  cyclobenzaprine (FLEXERIL) 5 MG tablet Take 5 mg by mouth 3 (three) times daily as needed for muscle spasms.    [provider]  docusate sodium (COLACE) 50 MG capsule Take 100 mg by mouth 2 (two) times daily.    [provider]  Fluticasone-Salmeterol (WIXELA INHUB) 250-50 MCG/DOSE AEPB Inhale 1 puff into the lungs 2 (two) times daily.    [provider]  gabapentin (NEURONTIN) 100 MG capsule Take 100 mg by mouth 3 (three) times daily.  09/05/17 09/05/18  [provider]  Garlic 4259 MG CAPS Take 1,000 mg by  mouth daily.     [provider]  glimepiride (AMARYL) 2 MG tablet Take 2 mg by mouth daily with breakfast.    [provider]  HYDROcodone-acetaminophen (NORCO) 10-325 MG tablet Take 1 tablet by mouth every 6 (six) hours as needed.    [provider]  Insulin Detemir (LEVEMIR FLEXTOUCH) 100 UNIT/ML Pen Inject 23 Units into the skin at bedtime.  03/01/18   [provider]  insulin lispro (HUMALOG) 100 UNIT/ML injection Inject into the skin 3 (three) times daily before meals.    [provider]  levothyroxine (SYNTHROID, LEVOTHROID) 175 MCG tablet Take 175 mcg by mouth every evening.     [provider]  linagliptin (  TRADJENTA) 5 MG TABS tablet take 1 tablet by mouth once daily 01/03/18   [provider]  loratadine (CLARITIN) 10 MG tablet Take 1 tablet (10 mg total) by mouth daily. 11/22/17   Sheikh, Omair Latif, DO  LORazepam (ATIVAN) 1 MG tablet Take 1 tablet (1 mg total) by mouth 2 (two) times daily. Patient not taking: Reported on 07/12/2018 02/06/18 02/06/19  Earleen Newport, MD  lovastatin (MEVACOR) 20 MG tablet Take 20 mg by mouth at bedtime.    [provider]  magnesium oxide (MAG-OX) 400 MG tablet Take 400 mg by mouth 2 (two) times daily.    [provider]  meclizine (ANTIVERT) 12.5 MG tablet Take 1 tablet (12.5 mg total) by mouth 3 (three) times daily as needed for dizziness. 03/03/17   Gladstone Lighter, MD  metolazone (ZAROXOLYN) 5 MG tablet Take 5 mg by mouth as needed (for a weight gain of 2 pounds overnight or 5 pounds in a week; MAX OF 5 TABLETS AT A TIME).  04/18/17   [provider]  Multiple Vitamins-Minerals (CENTRUM WOMEN) TABS Take 1 tablet by mouth daily.     [provider]  Omega-3 Fatty Acids (FISH OIL) 1000 MG CPDR Take 1 capsule by mouth daily.    [provider]  pantoprazole (PROTONIX) 40 MG tablet Take 40 mg by mouth 2 (two) times daily as needed.     [provider]  polyethylene glycol (MIRALAX / GLYCOLAX) packet Take 17 g by mouth 2 (two) times daily. Patient not taking: Reported on 07/12/2018 11/21/17   Raiford Noble Latif, DO  potassium chloride SA (K-DUR,KLOR-CON) 20 MEQ tablet Take 2 tablets (40 mEq total) by mouth daily. 06/07/18   Alisa Graff, FNP  senna (SENOKOT) 8.6 MG TABS tablet Take 2 tablets by mouth 2 (two) times daily.    [provider]  senna-docusate (SENOKOT-S) 8.6-50 MG tablet Take 1 tablet by mouth at bedtime as needed for mild constipation. Patient not taking: Reported on 07/12/2018 11/21/17   Raiford Noble Latif, DO  sodium phosphate (FLEET) 7-19 GM/118ML ENEM Place 133 mLs (1 enema total) rectally once as needed for severe constipation. Patient not taking: Reported on 07/12/2018 11/21/17   Raiford Noble Latif, DO  torsemide (DEMADEX) 20 MG tablet Take 2 tablets (40 mg total) by mouth 2 (two) times daily. 09/09/17   Alisa Graff, FNP  vitamin C (ASCORBIC ACID) 500 MG tablet Take 500 mg by mouth daily.    [provider]  warfarin (COUMADIN) 1 MG tablet Take 1 mg daily at 6 PM by mouth.     [provider]  warfarin (COUMADIN) 5 MG tablet Take 5 mg by mouth daily at 6 PM.  04/22/17   [provider]  zinc sulfate 220 (50 Zn) MG capsule Take 220 mg by mouth daily.    [provider]    Allergies Other; Latex; Exenatide; Garlic; Onion; Rosiglitazone; and Erythromycin  Family History  Problem Relation Age of Onset  . COPD Mother   . Heart disease Mother   . Anemia Mother   . Heart disease Father   . COPD Father   . Anemia Sister   . Diabetes Maternal Grandmother   . Hypertension Paternal Grandfather     Social History Social History   Tobacco Use  . Smoking status: Former Smoker    Packs/day: 3.00    Types: Cigarettes    Last attempt to quit: 1988    Years since quitting:  32.0  . Smokeless tobacco: Never Used  Substance Use Topics  . Alcohol use: No  . Drug  use: No    Review of Systems Constitutional: No fever/chills Eyes: No visual changes. ENT: No sore throat. Cardiovascular: Denies chest pain. Respiratory: Denies shortness of breath except did feel little short of breath while she was having episode of the palpitations earlier. Gastrointestinal: No abdominal pain.   Genitourinary: Negative for dysuria. Musculoskeletal: Negative for back pain. Skin: Negative for rash. Neurological: Negative for headaches, areas of focal weakness or numbness.    ____________________________________________   PHYSICAL EXAM:  VITAL SIGNS: ED Triage Vitals [08/09/18 1816]  Enc Vitals Group     BP      Pulse      Resp      Temp 97.9 F (36.6 C)     Temp Source Oral     SpO2      Weight (!) 330 lb (149.7 kg)     Height 5\' 5"  (1.651 m)     Head Circumference      Peak Flow      Pain Score 0     Pain Loc      Pain Edu?      Excl. in [place]?    Constitutional: Alert and oriented. Well appearing and in no acute distress. Eyes: Conjunctivae are normal. Head: Atraumatic. Nose: No congestion/rhinnorhea. Mouth/Throat: Mucous membranes are moist. Neck: No stridor.  Cardiovascular: Normal rate, regular rhythm. Grossly normal heart sounds.  Good peripheral circulation. Respiratory: Normal respiratory effort.  No retractions. Lungs CTAB. Gastrointestinal: Soft and nontender. No distention. Musculoskeletal: No lower extremity tenderness with a 1+ lower extremity pitting edema which she reports is an ongoing issue. Neurologic:  Normal speech and language. No gross focal neurologic deficits are appreciated.  Skin:  Skin is warm, dry and intact. No rash noted. Psychiatric: Mood and affect are normal. Speech and behavior are normal.  ____________________________________________   LABS (all labs ordered are listed, but only abnormal results are displayed)  Labs Reviewed  CBC - Abnormal; Notable for the following components:      Result Value   WBC  10.9 (*)    Hemoglobin 11.9 (*)    RDW 17.9 (*)    All other components within normal limits  BASIC METABOLIC PANEL - Abnormal; Notable for the following components:   Chloride 94 (*)    CO2 36 (*)    Glucose, Bld 146 (*)    BUN 39 (*)    Creatinine, Ser 1.94 (*)    GFR calc non Af Amer 27 (*)    GFR calc Af Amer 31 (*)    All other components within normal limits  TROPONIN I - Abnormal; Notable for the following components:   Troponin I 0.03 (*)    All other components within normal limits  BRAIN NATRIURETIC PEPTIDE - Abnormal; Notable for the following components:   B Natriuretic Peptide 173.0 (*)    All other components within normal limits  PROTIME-INR - Abnormal; Notable for the following components:   Prothrombin Time 25.7 (*)    All other components within normal limits  TSH  MAGNESIUM   ____________________________________________  EKG  Reviewed and interpreted by me at 1820 Heart rate 70 PR interval normal QRS 100 QTc 500 Normal sinus rhythm, no evidence of acute ischemia.  A nonspecific T wave abnormality seen in multiple leads, compared to the previous no notable change ____________________________________________  RADIOLOGY  Dg Chest 2 View  Result Date:  08/09/2018 CLINICAL DATA:  Palpitations, history of CHF EXAM: CHEST - 2 VIEW COMPARISON:  02/06/2018 chest radiograph. FINDINGS: Stable cardiomediastinal silhouette with mild cardiomegaly. No pneumothorax. No pleural effusion. No overt pulmonary edema. No acute consolidative airspace disease. IMPRESSION: Stable mild cardiomegaly without overt pulmonary edema. No active pulmonary disease. Electronically Signed   By: Ilona Sorrel M.D.   On: 08/09/2018 19:27    ____________________________________________   PROCEDURES  Procedure(s) performed: None  Procedures  Critical Care performed: No  ____________________________________________   INITIAL IMPRESSION / ASSESSMENT AND PLAN / ED COURSE  Pertinent  labs & imaging results that were available during my care of the patient were reviewed by me and considered in my medical decision making (see chart for details).   Patient presents for evaluation of palpitations.  Reassuring evaluation at this time, currently in normal sinus rhythm but in discussion with her sounds like she likely has had a slight exacerbation of her CHF which she has been managing very well at home, and also having some palpitations which I would guess is likely paroxysmal A. fib.  She is anticoagulated.   Chest x-ray reviewed, reassuring, lab work reassuring.  Creatinine appears at baseline compared with her previous in the [place] lab system.  She does report she feels fine asymptomatic in the ER at this time  No evidence of acute coronary syndrome, EKG reassuring with chronic changes noted.  Did not have any symptoms such as presyncopal symptoms or severe shortness of breath or chest pain associated the palpitations  ----------------------------------------- 8:51 PM on 08/09/2018 -----------------------------------------  Reviewed case care her current treatment and medications with Dr. Nehemiah Massed.  He advises that the patient seems reasonable for discharge with close outpatient follow-up, I am in agreement.  I have sent a message to Dr. Ubaldo Glassing to arrange close follow-up which Dr. Nehemiah Massed will suggested.  Patient agreement  Return precautions and treatment recommendations and follow-up discussed with the patient who is agreeable with the plan.       ____________________________________________   FINAL CLINICAL IMPRESSION(S) / ED DIAGNOSES  Final diagnoses:  Heart palpitations  Chronic congestive heart failure, unspecified heart failure type [facility])        Note:  This document was prepared using Dragon voice recognition software and may include unintentional dictation errors       Delman Kitten, MD 08/09/18 2051

## 2018-08-09 NOTE — ED Triage Notes (Signed)
Pt to ED via EMS from home. Pt c/o palpitations starting approx 3p today. Pt has dyspnea w/ exertion starting approx same time as palpitations. Pt took extra diuretic last night. NAD at this time

## 2018-08-17 ENCOUNTER — Encounter: Payer: Self-pay | Admitting: Family

## 2018-09-07 ENCOUNTER — Other Ambulatory Visit: Payer: Self-pay | Admitting: Family

## 2018-09-26 ENCOUNTER — Inpatient Hospital Stay: Payer: Medicare Other

## 2018-09-27 ENCOUNTER — Inpatient Hospital Stay: Payer: Medicare Other | Attending: Hematology and Oncology

## 2018-09-27 DIAGNOSIS — D509 Iron deficiency anemia, unspecified: Secondary | ICD-10-CM | POA: Insufficient documentation

## 2018-09-27 DIAGNOSIS — D631 Anemia in chronic kidney disease: Secondary | ICD-10-CM | POA: Diagnosis present

## 2018-09-27 DIAGNOSIS — N183 Chronic kidney disease, stage 3 unspecified: Secondary | ICD-10-CM

## 2018-09-27 LAB — CBC WITH DIFFERENTIAL/PLATELET
Abs Immature Granulocytes: 0.07 10*3/uL (ref 0.00–0.07)
Basophils Absolute: 0.1 10*3/uL (ref 0.0–0.1)
Basophils Relative: 1 %
Eosinophils Absolute: 0.2 10*3/uL (ref 0.0–0.5)
Eosinophils Relative: 2 %
HEMATOCRIT: 36.2 % (ref 36.0–46.0)
Hemoglobin: 11.1 g/dL — ABNORMAL LOW (ref 12.0–15.0)
Immature Granulocytes: 1 %
LYMPHS ABS: 1.2 10*3/uL (ref 0.7–4.0)
Lymphocytes Relative: 10 %
MCH: 27.1 pg (ref 26.0–34.0)
MCHC: 30.7 g/dL (ref 30.0–36.0)
MCV: 88.5 fL (ref 80.0–100.0)
Monocytes Absolute: 1 10*3/uL (ref 0.1–1.0)
Monocytes Relative: 8 %
Neutro Abs: 10.3 10*3/uL — ABNORMAL HIGH (ref 1.7–7.7)
Neutrophils Relative %: 78 %
Platelets: 213 10*3/uL (ref 150–400)
RBC: 4.09 MIL/uL (ref 3.87–5.11)
RDW: 19.3 % — ABNORMAL HIGH (ref 11.5–15.5)
WBC: 12.8 10*3/uL — ABNORMAL HIGH (ref 4.0–10.5)
nRBC: 0 % (ref 0.0–0.2)

## 2018-09-27 LAB — IRON AND TIBC
Iron: 28 ug/dL (ref 28–170)
SATURATION RATIOS: 9 % — AB (ref 10.4–31.8)
TIBC: 306 ug/dL (ref 250–450)
UIBC: 278 ug/dL

## 2018-09-27 LAB — FERRITIN: Ferritin: 57 ng/mL (ref 11–307)

## 2018-09-28 ENCOUNTER — Ambulatory Visit: Payer: Self-pay | Admitting: Hematology and Oncology

## 2018-09-28 ENCOUNTER — Ambulatory Visit: Payer: Self-pay

## 2018-10-02 ENCOUNTER — Encounter: Payer: Self-pay | Admitting: Emergency Medicine

## 2018-10-02 ENCOUNTER — Ambulatory Visit
Admission: EM | Admit: 2018-10-02 | Discharge: 2018-10-02 | Disposition: A | Discharge status: Home / Self Care | Payer: Medicare Other | Attending: Family Medicine | Admitting: Family Medicine

## 2018-10-02 ENCOUNTER — Other Ambulatory Visit: Payer: Self-pay

## 2018-10-02 DIAGNOSIS — R05 Cough: Secondary | ICD-10-CM | POA: Diagnosis not present

## 2018-10-02 DIAGNOSIS — Z87891 Personal history of nicotine dependence: Secondary | ICD-10-CM

## 2018-10-02 DIAGNOSIS — R059 Cough, unspecified: Secondary | ICD-10-CM

## 2018-10-02 DIAGNOSIS — J40 Bronchitis, not specified as acute or chronic: Secondary | ICD-10-CM | POA: Diagnosis not present

## 2018-10-02 MED ORDER — AMOXICILLIN-POT CLAVULANATE 875-125 MG PO TABS: 1.0000 | ORAL_TABLET | Freq: Two times a day (BID) | ORAL | 0 refills | Status: DC

## 2018-10-02 NOTE — ED Provider Notes (Signed)
[facility]    CSN: [id number] Arrival date & time: 10/02/18  1013     History   Chief Complaint Chief Complaint  Patient presents with  . Cough  . Nasal Congestion    HPI Andrea Rivers is a 63 y.o. female.   The history is provided by the patient.  Cough  Cough characteristics:  Productive Associated symptoms: no wheezing   URI  Presenting symptoms: congestion, cough and fatigue   Severity:  Moderate Onset quality:  Sudden Duration:  1 week Timing:  Constant Progression:  Worsening Chronicity:  New Relieved by:  Nothing Ineffective treatments:  OTC medications Associated symptoms: no wheezing   Risk factors: chronic cardiac disease, chronic respiratory disease and sick contacts     Past Medical History:  Diagnosis Date  . Anemia   . Anxiety   . CHF (congestive heart failure) ([place])   . Chronic kidney disease    INSUFFICIENCY  . Complication of anesthesia    had to be intubated during cataract surgery   " I could not breath "  . COPD (chronic obstructive pulmonary disease) ([place])   . Diabetes mellitus without complication ([place])   . Dyspnea    DOE  . Dysrhythmia    A FIB  . Edema   . Fracture of distal femur ([place]) 11/2017   left   . GERD (gastroesophageal reflux disease)   . History of kidney stones   . Hypertension   . Hypothyroidism    ABLATION  . Iron deficiency anemia 03/31/2017  . Neuropathy   . Orthopnea   . Sleep apnea    CPAP  . Vertigo   . Wheezing     Patient Active Problem List   Diagnosis Date Noted  . Hypokalemia 06/08/2018  . Lymphedema 06/08/2018  . Closed displaced supracondylar fracture of distal end of left femur with intracondylar extension ([place]) 11/17/2017  . CKD (chronic kidney disease) 11/14/2017  . Hypothyroidism 11/14/2017  . Atrial fibrillation ([place]) 11/14/2017  . Anxiety state 11/14/2017  . Peripheral neuropathy 11/14/2017  . Fracture 11/14/2017  . Bradycardia 08/13/2017  . Microcytic anemia  03/31/2017  . Iron deficiency anemia 03/31/2017  . Chronic diastolic heart failure ([place]) 03/10/2017  . HTN (hypertension) 03/10/2017  . Diabetes ([place]) 03/10/2017  . Obstructive sleep apnea 03/10/2017  . Shock [facility])     Past Surgical History:  Procedure Laterality Date  . CATARACT EXTRACTION W/PHACO Left 01/26/2017   Procedure: CATARACT EXTRACTION PHACO AND INTRAOCULAR LENS PLACEMENT (IOC);  Surgeon: Estill Cotta, MD;  Location: [facility] ORS;  Service: Ophthalmology;  Laterality: Left;  [place]   1:02.2 AP     23.7 CDE   28.92 fluid pack lot# [id number] H exp.05/08/2018  . CHOLECYSTECTOMY    . ORIF FEMUR FRACTURE Left 11/17/2017   Procedure: OPEN REDUCTION INTERNAL FIXATION (ORIF) DISTAL FEMUR FRACTURE;  Surgeon: Shona Needles, MD;  Location: [facility];  Service: Orthopedics;  Laterality: Left;  . TONSILLECTOMY      OB History   No obstetric history on file.      Home Medications    Prior to Admission medications   Medication Sig Start Date End Date Taking? Authorizing Provider  acetaminophen (TYLENOL) 500 MG tablet Take 1,000 mg by mouth 2 (two) times daily as needed.   Yes [provider]  albuterol (PROAIR HFA) 108 (90 Base) MCG/ACT inhaler inhale 2 puffs by mouth INTO LUNGS every 4 to 6 hours if needed for wheezing or shortness of breath 03/14/17  Yes  [provider]  albuterol (PROVENTIL) (2.5 MG/3ML) 0.083% nebulizer solution Inhale 3 mLs (2.5 mg total) into the lungs every 4 (four) hours as needed for wheezing or shortness of breath. 11/21/17  Yes Sheikh, Omair Latif, DO  Amino Acids-Protein Hydrolys (FEEDING SUPPLEMENT, PRO-STAT SUGAR FREE 64,) LIQD Take 30 mLs by mouth 3 (three) times daily with meals.   Yes [provider]  amiodarone (PACERONE) 200 MG tablet Take 200 mg by mouth daily. 05/10/17  Yes [provider]  azelastine (ASTELIN) 0.1 % nasal spray Place 2 sprays into both nostrils 2 (two) times daily as needed.  10/24/17  Yes [provider]  Biotin 10 MG CAPS Take by mouth.   Yes [provider]  Boswellia Serrata (BOSWELLIA PO) Take by mouth.   Yes [provider]  Calcium Carbonate-Vitamin D3 (CALCIUM 600-D) 600-400 MG-UNIT TABS Take 1 tablet by mouth daily.   Yes [provider]  Cholecalciferol (D3 HIGH POTENCY) 125 MCG (5000 UT) capsule Take 5,000 Units by mouth daily.    Yes [provider]  citalopram (CELEXA) 20 MG tablet Take 40 mg by mouth daily.    Yes [provider]  clonazePAM (KLONOPIN) 1 MG tablet Take 1 tablet by mouth 2 (two) times daily as needed.  05/17/18  Yes [provider]  cloNIDine (CATAPRES) 0.1 MG tablet Take 1 tablet (0.1 mg total) by mouth 2 (two) times daily. 07/12/17  Yes Hackney, Otila Kluver A, FNP  Co-Enzyme Q-10 100 MG CAPS Take 100 mg by mouth daily.    Yes [provider]  cyclobenzaprine (FLEXERIL) 5 MG tablet Take 5 mg by mouth 3 (three) times daily as needed for muscle spasms.   Yes [provider]  docusate sodium (COLACE) 50 MG capsule Take 100 mg by mouth 2 (two) times daily.   Yes [provider]  Fluticasone-Salmeterol (WIXELA INHUB) 250-50 MCG/DOSE AEPB Inhale 1 puff into the lungs 2 (two) times daily.   Yes [provider]  Garlic 4580 MG CAPS Take 1,000 mg by mouth daily.    Yes [provider]  glimepiride (AMARYL) 2 MG tablet Take 2 mg by mouth daily with breakfast.   Yes [provider]  HYDROcodone-acetaminophen (NORCO) 10-325 MG tablet Take 1 tablet by mouth every 6 (six) hours as needed.   Yes [provider]  Insulin Detemir (LEVEMIR FLEXTOUCH) 100 UNIT/ML Pen Inject 23 Units into the skin at bedtime.  03/01/18  Yes [provider]  insulin lispro (HUMALOG) 100 UNIT/ML injection Inject into the skin 3 (three) times daily before meals.   Yes [provider]  levothyroxine (SYNTHROID, LEVOTHROID) 175 MCG tablet Take 175 mcg by mouth every  evening.    Yes [provider]  linagliptin (TRADJENTA) 5 MG TABS tablet take 1 tablet by mouth once daily 01/03/18  Yes [provider]  loratadine (CLARITIN) 10 MG tablet Take 1 tablet (10 mg total) by mouth daily. 11/22/17  Yes Sheikh, Omair Latif, DO  LORazepam (ATIVAN) 1 MG tablet Take 1 tablet (1 mg total) by mouth 2 (two) times daily. 02/06/18 02/06/19 Yes Earleen Newport, MD  lovastatin (MEVACOR) 20 MG tablet Take 20 mg by mouth at bedtime.   Yes [provider]  magnesium oxide (MAG-OX) 400 MG tablet Take 400 mg by mouth 2 (two) times daily.   Yes [provider]  meclizine (ANTIVERT) 12.5 MG tablet Take 1 tablet (12.5 mg total) by mouth 3 (three) times daily as needed for  dizziness. 03/03/17  Yes Gladstone Lighter, MD  metolazone (ZAROXOLYN) 5 MG tablet Take 5 mg by mouth as needed (for a weight gain of 2 pounds overnight or 5 pounds in a week; MAX OF 5 TABLETS AT A TIME).  04/18/17  Yes [provider]  Multiple Vitamins-Minerals (CENTRUM WOMEN) TABS Take 1 tablet by mouth daily.    Yes [provider]  Omega-3 Fatty Acids (FISH OIL) 1000 MG CPDR Take 1 capsule by mouth daily.   Yes [provider]  pantoprazole (PROTONIX) 40 MG tablet Take 40 mg by mouth 2 (two) times daily as needed.    Yes [provider]  polyethylene glycol (MIRALAX / GLYCOLAX) packet Take 17 g by mouth 2 (two) times daily. 11/21/17  Yes Sheikh, Omair Latif, DO  potassium chloride SA (K-DUR,KLOR-CON) 20 MEQ tablet Take 2 tablets (40 mEq total) by mouth daily. 06/07/18  Yes Hackney, Tina A, FNP  senna (SENOKOT) 8.6 MG TABS tablet Take 2 tablets by mouth 2 (two) times daily.   Yes [provider]  senna-docusate (SENOKOT-S) 8.6-50 MG tablet Take 1 tablet by mouth at bedtime as needed for mild constipation. 11/21/17  Yes Sheikh, Omair Latif, DO  sodium phosphate (FLEET) 7-19 GM/118ML ENEM Place 133 mLs (1 enema total) rectally once as needed  for severe constipation. 11/21/17  Yes Sheikh, Omair Latif, DO  torsemide (DEMADEX) 20 MG tablet TAKE 2 TABLETS BY MOUTH TWICE A DAY 09/07/18  Yes Darylene Price A, FNP  vitamin C (ASCORBIC ACID) 500 MG tablet Take 500 mg by mouth daily.   Yes [provider]  warfarin (COUMADIN) 1 MG tablet Take 1 mg daily at 6 PM by mouth.    Yes [provider]  warfarin (COUMADIN) 5 MG tablet Take 5 mg by mouth daily at 6 PM.  04/22/17  Yes [provider]  zinc sulfate 220 (50 Zn) MG capsule Take 220 mg by mouth daily.   Yes [provider]  amoxicillin-clavulanate (AUGMENTIN) 875-125 MG tablet Take 1 tablet by mouth 2 (two) times daily. 10/02/18   Norval Gable, MD  gabapentin (NEURONTIN) 100 MG capsule Take 100 mg by mouth 3 (three) times daily.  09/05/17 09/05/18  [provider]    Family History Family History  Problem Relation Age of Onset  . COPD Mother   . Heart disease Mother   . Anemia Mother   . Heart disease Father   . COPD Father   . Anemia Sister   . Diabetes Maternal Grandmother   . Hypertension Paternal Grandfather     Social History Social History   Tobacco Use  . Smoking status: Former Smoker    Packs/day: 3.00    Types: Cigarettes    Last attempt to quit: 1988    Years since quitting: 32.1  . Smokeless tobacco: Never Used  Substance Use Topics  . Alcohol use: No  . Drug use: No     Allergies   Other; Latex; Exenatide; Garlic; Onion; Rosiglitazone; and Erythromycin   Review of Systems Review of Systems  Constitutional: Positive for fatigue.  HENT: Positive for congestion.   Respiratory: Positive for cough. Negative for wheezing.      Physical Exam Triage Vital Signs ED Triage Vitals  Enc Vitals Group     BP 10/02/18 1052 128/64     Pulse Rate 10/02/18 1052 68     Resp 10/02/18 1052 20     Temp 10/02/18 1052 97.9 F (36.6 C)  Temp Source 10/02/18 1052 Oral     SpO2 10/02/18 1052 96 %     Weight 10/02/18 1049  (!) 330 lb (149.7 kg)     Height 10/02/18 1049 5\' 4"  (1.626 m)     Head Circumference --      Peak Flow --      Pain Score 10/02/18 1049 0     Pain Loc --      Pain Edu? --      Excl. in [place]? --    No data found.  Updated Vital Signs BP 128/64 (BP Location: Left Arm)   Pulse 68   Temp 97.9 F (36.6 C) (Oral)   Resp 20   Ht 5\' 4"  (1.626 m)   Wt (!) 149.7 kg   SpO2 96%   BMI 56.64 kg/m   Visual Acuity Right Eye Distance:   Left Eye Distance:   Bilateral Distance:    Right Eye Near:   Left Eye Near:    Bilateral Near:     Physical Exam Vitals signs and nursing note reviewed.  Constitutional:      General: She is not in acute distress.    Appearance: She is well-developed. She is not toxic-appearing or diaphoretic.  HENT:     Head: Normocephalic and atraumatic.     Mouth/Throat:     Pharynx: Uvula midline. No oropharyngeal exudate.  Eyes:     General: No scleral icterus.       Right eye: No discharge.        Left eye: No discharge.  Neck:     Musculoskeletal: Normal range of motion and neck supple.     Thyroid: No thyromegaly.  Cardiovascular:     Rate and Rhythm: Normal rate and regular rhythm.     Heart sounds: Normal heart sounds.  Pulmonary:     Effort: Pulmonary effort is normal. No respiratory distress.     Breath sounds: No stridor. Rhonchi and rales (left base) present. No wheezing.  Lymphadenopathy:     Cervical: No cervical adenopathy.  Neurological:     Mental Status: She is alert.      UC Treatments / Results  Labs (all labs ordered are listed, but only abnormal results are displayed) Labs Reviewed - No data to display  EKG None  Radiology No results found.  Procedures Procedures (including critical care time)  Medications Ordered in UC Medications - No data to display  Initial Impression / Assessment and Plan / UC Course  I have reviewed the triage vital signs and the nursing notes.  Pertinent labs & imaging results that were  available during my care of the patient were reviewed by me and considered in my medical decision making (see chart for details).      Final Clinical Impressions(s) / UC Diagnoses   Final diagnoses:  Cough  Bronchitis    ED Prescriptions    Medication Sig Dispense Auth. Provider   amoxicillin-clavulanate (AUGMENTIN) 875-125 MG tablet Take 1 tablet by mouth 2 (two) times daily. 14 tablet Lenard Kampf, Linward Foster, MD     1. diagnosis reviewed with patient 2. rx as per orders above; reviewed possible side effects, interactions, risks and benefits  3. Follow-up prn if symptoms worsen or don't improve  Controlled Substance Prescriptions [place] Controlled Substance Registry consulted? Not Applicable   Norval Gable, MD 10/02/18 [phone]

## 2018-10-02 NOTE — ED Triage Notes (Signed)
Patient c/o nasal congestion and cough that started 1 week ago. Denies fever.

## 2018-10-03 ENCOUNTER — Ambulatory Visit: Payer: Self-pay | Admitting: Hematology and Oncology

## 2018-10-03 ENCOUNTER — Ambulatory Visit: Payer: Self-pay

## 2018-10-03 NOTE — Progress Notes (Deleted)
[facility] day:  10/03/2018  Chief Complaint: Andrea Rivers is a 63 y.o. female with iron deficiency anemia and anemia of chronic renal insufficiency who is seen for new patient assessment.  HPI:  The patient was admitted to [facility] from 02/22/2017 - 03/03/2017 with acute respiratory failure secondary to diastolic CHF exacerbation.  She had an acute respiratory failure resulting in the cardiac arrest.  She was initially admitted to ICU, intubated and required mechanical ventilation.  She was noted to have acute renal failure with a baseline creatinine around 1.8.    SPEP and UPEP in the nephrology office were negative.  Labs showed a microcytic anemia.  Hemoglobin was 6.5 with a MCV 67.9.  She received 1 unit PRBCs on 02/24/2017.  Hemoglobin improved to 8.6.  She had a left distal comminuted fracture after a fall and underwent ORIF on 11/17/2017.  Hemoglobin decreased to 7.7.  She received 2 units of PRBCs.  The patient was last seen in the hematology clinic on 06/29/2018 by Dr. Tasia Catchings.  CBC on 06/26/2018 revealed a hematocrit of 37.9, hemoglobin 11.5, and MCV 86.5.  She was noted to have a borderline iron saturation (13%).  She received Venofer x 1.  Ferritin has been followed:  21 on 03/31/2017, 80 on 04/21/2017, 35 on 05/18/2017, 117 on 06/22/2017, 61 on 07/20/2017, 93 on 09/27/2017, 79 on 12/27/2017, 65 on 03/27/2018, 69 on 06/26/2018, and 57 on 09/27/2018.  CBC on 09/27/2018 revealed a hematocrit of 36.2, hemoglobin 11.1, and MCV 88.5.  Ferritin was 57.  Iron saturation was 9% with a TIBC of 306.    She was seen in the [facility] ER on 10/02/2018 with cough and nasal congestion.  She was diagnosed with bronchitis and treated with Augmentin 875-125 BID x 7 days.  Diet is x.  She has declined referral for GI work-up for iron deficiency.  During the interim,   Past Medical History:  Diagnosis Date  . Anemia   . Anxiety   . CHF (congestive heart  failure) ([place])   . Chronic kidney disease    INSUFFICIENCY  . Complication of anesthesia    had to be intubated during cataract surgery   " I could not breath "  . COPD (chronic obstructive pulmonary disease) ([place])   . Diabetes mellitus without complication ([place])   . Dyspnea    DOE  . Dysrhythmia    A FIB  . Edema   . Fracture of distal femur ([place]) 11/2017   left   . GERD (gastroesophageal reflux disease)   . History of kidney stones   . Hypertension   . Hypothyroidism    ABLATION  . Iron deficiency anemia 03/31/2017  . Neuropathy   . Orthopnea   . Sleep apnea    CPAP  . Vertigo   . Wheezing     Past Surgical History:  Procedure Laterality Date  . CATARACT EXTRACTION W/PHACO Left 01/26/2017   Procedure: CATARACT EXTRACTION PHACO AND INTRAOCULAR LENS PLACEMENT (IOC);  Surgeon: Estill Cotta, MD;  Location: [facility] ORS;  Service: Ophthalmology;  Laterality: Left;  [place]   1:02.2 AP     23.7 CDE   28.92 fluid pack lot# [id number] H exp.05/08/2018  . CHOLECYSTECTOMY    . ORIF FEMUR FRACTURE Left 11/17/2017   Procedure: OPEN REDUCTION INTERNAL FIXATION (ORIF) DISTAL FEMUR FRACTURE;  Surgeon: Shona Needles, MD;  Location: [facility];  Service: Orthopedics;  Laterality: Left;  . TONSILLECTOMY  Family History  Problem Relation Age of Onset  . COPD Mother   . Heart disease Mother   . Anemia Mother   . Heart disease Father   . COPD Father   . Anemia Sister   . Diabetes Maternal Grandmother   . Hypertension Paternal Grandfather     Social History:  reports that she quit smoking about 32 years ago. Her smoking use included cigarettes. She smoked 3.00 packs per day. She has never used smokeless tobacco. She reports that she does not drink alcohol or use drugs.  The patient is accompanied by *** alone today.  Allergies:  Allergies  Allergen Reactions  . Other Other (See Comments)    ILOSONE- Caused GI distress also  . Latex Hives  . Exenatide Nausea Only and Other (See  Comments)    Byetta- Nausea and abdominal pain, also  . Garlic Other (See Comments)    Severe acid reflux  . Onion Other (See Comments)    Severe acid reflux  . Rosiglitazone Other (See Comments)    Avandia- Affected heart  . Erythromycin Nausea And Vomiting    GI DISTRESS    Current Medications: Current Outpatient Medications  Medication Sig Dispense Refill  . acetaminophen (TYLENOL) 500 MG tablet Take 1,000 mg by mouth 2 (two) times daily as needed.    Marland Kitchen albuterol (PROAIR HFA) 108 (90 Base) MCG/ACT inhaler inhale 2 puffs by mouth INTO LUNGS every 4 to 6 hours if needed for wheezing or shortness of breath    . albuterol (PROVENTIL) (2.5 MG/3ML) 0.083% nebulizer solution Inhale 3 mLs (2.5 mg total) into the lungs every 4 (four) hours as needed for wheezing or shortness of breath. 75 mL 12  . Amino Acids-Protein Hydrolys (FEEDING SUPPLEMENT, PRO-STAT SUGAR FREE 64,) LIQD Take 30 mLs by mouth 3 (three) times daily with meals.    Marland Kitchen amiodarone (PACERONE) 200 MG tablet Take 200 mg by mouth daily.  0  . amoxicillin-clavulanate (AUGMENTIN) 875-125 MG tablet Take 1 tablet by mouth 2 (two) times daily. 14 tablet 0  . azelastine (ASTELIN) 0.1 % nasal spray Place 2 sprays into both nostrils 2 (two) times daily as needed.   1  . Biotin 10 MG CAPS Take by mouth.    Azucena Freed Serrata (BOSWELLIA PO) Take by mouth.    . Calcium Carbonate-Vitamin D3 (CALCIUM 600-D) 600-400 MG-UNIT TABS Take 1 tablet by mouth daily.    . Cholecalciferol (D3 HIGH POTENCY) 125 MCG (5000 UT) capsule Take 5,000 Units by mouth daily.     . citalopram (CELEXA) 20 MG tablet Take 40 mg by mouth daily.     . clonazePAM (KLONOPIN) 1 MG tablet Take 1 tablet by mouth 2 (two) times daily as needed.     . cloNIDine (CATAPRES) 0.1 MG tablet Take 1 tablet (0.1 mg total) by mouth 2 (two) times daily. 60 tablet 5  . Co-Enzyme Q-10 100 MG CAPS Take 100 mg by mouth daily.     . cyclobenzaprine (FLEXERIL) 5 MG tablet Take 5 mg by mouth 3  (three) times daily as needed for muscle spasms.    Marland Kitchen docusate sodium (COLACE) 50 MG capsule Take 100 mg by mouth 2 (two) times daily.    . Fluticasone-Salmeterol (WIXELA INHUB) 250-50 MCG/DOSE AEPB Inhale 1 puff into the lungs 2 (two) times daily.    Marland Kitchen gabapentin (NEURONTIN) 100 MG capsule Take 100 mg by mouth 3 (three) times daily.     . Garlic 8502 MG CAPS Take 1,000  mg by mouth daily.     Marland Kitchen glimepiride (AMARYL) 2 MG tablet Take 2 mg by mouth daily with breakfast.    . HYDROcodone-acetaminophen (NORCO) 10-325 MG tablet Take 1 tablet by mouth every 6 (six) hours as needed.    . Insulin Detemir (LEVEMIR FLEXTOUCH) 100 UNIT/ML Pen Inject 23 Units into the skin at bedtime.     . insulin lispro (HUMALOG) 100 UNIT/ML injection Inject into the skin 3 (three) times daily before meals.    Marland Kitchen levothyroxine (SYNTHROID, LEVOTHROID) 175 MCG tablet Take 175 mcg by mouth every evening.     . linagliptin (TRADJENTA) 5 MG TABS tablet take 1 tablet by mouth once daily    . loratadine (CLARITIN) 10 MG tablet Take 1 tablet (10 mg total) by mouth daily. 30 tablet 0  . LORazepam (ATIVAN) 1 MG tablet Take 1 tablet (1 mg total) by mouth 2 (two) times daily. 20 tablet 0  . lovastatin (MEVACOR) 20 MG tablet Take 20 mg by mouth at bedtime.    . magnesium oxide (MAG-OX) 400 MG tablet Take 400 mg by mouth 2 (two) times daily.    . meclizine (ANTIVERT) 12.5 MG tablet Take 1 tablet (12.5 mg total) by mouth 3 (three) times daily as needed for dizziness. 30 tablet 0  . metolazone (ZAROXOLYN) 5 MG tablet Take 5 mg by mouth as needed (for a weight gain of 2 pounds overnight or 5 pounds in a week; MAX OF 5 TABLETS AT A TIME).     . Multiple Vitamins-Minerals (CENTRUM WOMEN) TABS Take 1 tablet by mouth daily.     . Omega-3 Fatty Acids (FISH OIL) 1000 MG CPDR Take 1 capsule by mouth daily.    . pantoprazole (PROTONIX) 40 MG tablet Take 40 mg by mouth 2 (two) times daily as needed.     . polyethylene glycol (MIRALAX / GLYCOLAX)  packet Take 17 g by mouth 2 (two) times daily. 14 each 0  . potassium chloride SA (K-DUR,KLOR-CON) 20 MEQ tablet Take 2 tablets (40 mEq total) by mouth daily. 180 tablet 3  . senna (SENOKOT) 8.6 MG TABS tablet Take 2 tablets by mouth 2 (two) times daily.    Marland Kitchen senna-docusate (SENOKOT-S) 8.6-50 MG tablet Take 1 tablet by mouth at bedtime as needed for mild constipation. 30 tablet 0  . sodium phosphate (FLEET) 7-19 GM/118ML ENEM Place 133 mLs (1 enema total) rectally once as needed for severe constipation. 1 enema 0  . torsemide (DEMADEX) 20 MG tablet TAKE 2 TABLETS BY MOUTH TWICE A DAY 360 tablet 3  . vitamin C (ASCORBIC ACID) 500 MG tablet Take 500 mg by mouth daily.    Marland Kitchen warfarin (COUMADIN) 1 MG tablet Take 1 mg daily at 6 PM by mouth.     . warfarin (COUMADIN) 5 MG tablet Take 5 mg by mouth daily at 6 PM.   0  . zinc sulfate 220 (50 Zn) MG capsule Take 220 mg by mouth daily.     No current facility-administered medications for this visit.     Review of Systems:  GENERAL:  Feels good.  Active.  No fevers, sweats or weight loss. PERFORMANCE STATUS (ECOG):  *** HEENT:  No visual changes, runny nose, sore throat, mouth sores or tenderness. Lungs: No shortness of breath or cough.  No hemoptysis. Cardiac:  No chest pain, palpitations, orthopnea, or PND. GI:  No nausea, vomiting, diarrhea, constipation, melena or hematochezia. GU:  No urgency, frequency, dysuria, or hematuria. Musculoskeletal:  No back  pain.  No joint pain.  No muscle tenderness. Extremities:  No pain or swelling. Skin:  No rashes or skin changes. Neuro:  No headache, numbness or weakness, balance or coordination issues. Endocrine:  No diabetes, thyroid issues, hot flashes or night sweats. Psych:  No mood changes, depression or anxiety. Pain:  No focal pain. Review of systems:  All other systems reviewed and found to be negative.  Physical Exam: There were no vitals taken for this visit. GENERAL:  Well developed, well  nourished, **man sitting comfortably in the exam room in no acute distress. MENTAL STATUS:  Alert and oriented to person, place and time. HEAD:  *** hair.  Normocephalic, atraumatic, face symmetric, no Cushingoid features. EYES:  *** eyes.  Pupils equal round and reactive to light and accomodation.  No conjunctivitis or scleral icterus. ENT:  Oropharynx clear without lesion.  Tongue normal. Mucous membranes moist.  RESPIRATORY:  Clear to auscultation without rales, wheezes or rhonchi. CARDIOVASCULAR:  Regular rate and rhythm without murmur, rub or gallop. ABDOMEN:  Soft, non-tender, with active bowel sounds, and no hepatosplenomegaly.  No masses. SKIN:  No rashes, ulcers or lesions. EXTREMITIES: No edema, no skin discoloration or tenderness.  No palpable cords. LYMPH NODES: No palpable cervical, supraclavicular, axillary or inguinal adenopathy  NEUROLOGICAL: Unremarkable. PSYCH:  Appropriate.   No visits with results within 3 Day(s) from this visit.  Latest known visit with results is:  Appointment on 09/27/2018  Component Date Value Ref Range Status  . Ferritin 09/27/2018 57  11 - 307 ng/mL Final   Performed at [facility], [address] [place], [place] [address]  . Iron 09/27/2018 28  28 - 170 ug/dL Final  . TIBC 09/27/2018 306  250 - 450 ug/dL Final  . Saturation Ratios 09/27/2018 9* 10.4 - 31.8 % Final  . UIBC 09/27/2018 278  ug/dL Final   Performed at [facility], [address], [place], [place] [address]  . WBC 09/27/2018 12.8* 4.0 - 10.5 K/uL Final  . RBC 09/27/2018 4.09  3.87 - 5.11 MIL/uL Final  . Hemoglobin 09/27/2018 11.1* 12.0 - 15.0 g/dL Final  . HCT 09/27/2018 36.2  36.0 - 46.0 % Final  . MCV 09/27/2018 88.5  80.0 - 100.0 fL Final  . MCH 09/27/2018 27.1  26.0 - 34.0 pg Final  . MCHC 09/27/2018 30.7  30.0 - 36.0 g/dL Final  . RDW 09/27/2018 19.3* 11.5 - 15.5 % Final  . Platelets 09/27/2018 213  150 - 400 K/uL Final  . nRBC 09/27/2018 0.0  0.0  - 0.2 % Final  . Neutrophils Relative % 09/27/2018 78  % Final  . Neutro Abs 09/27/2018 10.3* 1.7 - 7.7 K/uL Final  . Lymphocytes Relative 09/27/2018 10  % Final  . Lymphs Abs 09/27/2018 1.2  0.7 - 4.0 K/uL Final  . Monocytes Relative 09/27/2018 8  % Final  . Monocytes Absolute 09/27/2018 1.0  0.1 - 1.0 K/uL Final  . Eosinophils Relative 09/27/2018 2  % Final  . Eosinophils Absolute 09/27/2018 0.2  0.0 - 0.5 K/uL Final  . Basophils Relative 09/27/2018 1  % Final  . Basophils Absolute 09/27/2018 0.1  0.0 - 0.1 K/uL Final  . Immature Granulocytes 09/27/2018 1  % Final  . Abs Immature Granulocytes 09/27/2018 0.07  0.00 - 0.07 K/uL Final   Performed at [facility], [address] [place],  [address]    Assessment:  Andrea Rivers is a 63 y.o. female with iron  deficiency anemia and anemia of chronic renal disease, stage 3.  She has received Venofer  x 4 (04/04/2018 - 04/15/2017), weekly x 3 (05/19/2017 - 06/03/2017), 07/21/2017, 12/29/2017, 03/30/2018, and 06/29/2018.   Ferritin has been followed:  21 on 03/31/2017, 80 on 04/21/2017, 35 on 05/18/2017, 117 on 06/22/2017, 61 on 07/20/2017, 93 on 09/27/2017, 79 on 12/27/2017, 65 on 03/27/2018, 69 on 06/26/2018, and 57 on 09/27/2018.  She has received 3 units of PRBCs (02/24/2017, 11/15/2017 and 11/16/2017).  She has declined GI evaluation.  She has chronic renal insufficiency.  Baseline creatinine is 1.8 (1.45 - 1.92).  SPEP and UPEP in the nephrology office were negative.  Symptomatically,  Plan: 1.  Iron deficiency anemia  Review medical history of treatment to date.  Review interval labs. 2.   3.   4.     Lequita Asal, MD  10/03/2018, 2:48 AM

## 2018-10-10 ENCOUNTER — Other Ambulatory Visit: Payer: Self-pay

## 2018-10-10 ENCOUNTER — Inpatient Hospital Stay
Admission: EM | Admit: 2018-10-10 | Discharge: 2018-10-12 | DRG: 378 | Disposition: A | Discharge status: Home / Self Care | Payer: Medicare Other | Attending: Internal Medicine | Admitting: Internal Medicine

## 2018-10-10 ENCOUNTER — Emergency Department: Payer: Medicare Other

## 2018-10-10 ENCOUNTER — Encounter: Payer: Self-pay | Admitting: *Deleted

## 2018-10-10 DIAGNOSIS — Z6841 Body Mass Index (BMI) 40.0 and over, adult: Secondary | ICD-10-CM

## 2018-10-10 DIAGNOSIS — Z7901 Long term (current) use of anticoagulants: Secondary | ICD-10-CM

## 2018-10-10 DIAGNOSIS — I130 Hypertensive heart and chronic kidney disease with heart failure and stage 1 through stage 4 chronic kidney disease, or unspecified chronic kidney disease: Secondary | ICD-10-CM | POA: Diagnosis present

## 2018-10-10 DIAGNOSIS — E1122 Type 2 diabetes mellitus with diabetic chronic kidney disease: Secondary | ICD-10-CM | POA: Diagnosis present

## 2018-10-10 DIAGNOSIS — E1140 Type 2 diabetes mellitus with diabetic neuropathy, unspecified: Secondary | ICD-10-CM | POA: Diagnosis present

## 2018-10-10 DIAGNOSIS — I5032 Chronic diastolic (congestive) heart failure: Secondary | ICD-10-CM | POA: Diagnosis present

## 2018-10-10 DIAGNOSIS — R079 Chest pain, unspecified: Secondary | ICD-10-CM | POA: Diagnosis present

## 2018-10-10 DIAGNOSIS — Z825 Family history of asthma and other chronic lower respiratory diseases: Secondary | ICD-10-CM | POA: Diagnosis not present

## 2018-10-10 DIAGNOSIS — E119 Type 2 diabetes mellitus without complications: Secondary | ICD-10-CM

## 2018-10-10 DIAGNOSIS — D649 Anemia, unspecified: Secondary | ICD-10-CM

## 2018-10-10 DIAGNOSIS — J449 Chronic obstructive pulmonary disease, unspecified: Secondary | ICD-10-CM | POA: Diagnosis present

## 2018-10-10 DIAGNOSIS — K219 Gastro-esophageal reflux disease without esophagitis: Secondary | ICD-10-CM | POA: Diagnosis present

## 2018-10-10 DIAGNOSIS — Z881 Allergy status to other antibiotic agents status: Secondary | ICD-10-CM | POA: Diagnosis not present

## 2018-10-10 DIAGNOSIS — R002 Palpitations: Secondary | ICD-10-CM | POA: Diagnosis present

## 2018-10-10 DIAGNOSIS — Z87891 Personal history of nicotine dependence: Secondary | ICD-10-CM | POA: Diagnosis not present

## 2018-10-10 DIAGNOSIS — Z794 Long term (current) use of insulin: Secondary | ICD-10-CM

## 2018-10-10 DIAGNOSIS — Z833 Family history of diabetes mellitus: Secondary | ICD-10-CM

## 2018-10-10 DIAGNOSIS — E039 Hypothyroidism, unspecified: Secondary | ICD-10-CM | POA: Diagnosis present

## 2018-10-10 DIAGNOSIS — N184 Chronic kidney disease, stage 4 (severe): Secondary | ICD-10-CM | POA: Diagnosis present

## 2018-10-10 DIAGNOSIS — I482 Chronic atrial fibrillation, unspecified: Secondary | ICD-10-CM | POA: Diagnosis present

## 2018-10-10 DIAGNOSIS — K317 Polyp of stomach and duodenum: Secondary | ICD-10-CM | POA: Diagnosis present

## 2018-10-10 DIAGNOSIS — I4891 Unspecified atrial fibrillation: Secondary | ICD-10-CM | POA: Diagnosis present

## 2018-10-10 DIAGNOSIS — K5909 Other constipation: Secondary | ICD-10-CM | POA: Diagnosis present

## 2018-10-10 DIAGNOSIS — G4733 Obstructive sleep apnea (adult) (pediatric): Secondary | ICD-10-CM | POA: Diagnosis present

## 2018-10-10 DIAGNOSIS — Z888 Allergy status to other drugs, medicaments and biological substances status: Secondary | ICD-10-CM | POA: Diagnosis not present

## 2018-10-10 DIAGNOSIS — K254 Chronic or unspecified gastric ulcer with hemorrhage: Principal | ICD-10-CM | POA: Diagnosis present

## 2018-10-10 DIAGNOSIS — N189 Chronic kidney disease, unspecified: Secondary | ICD-10-CM | POA: Diagnosis present

## 2018-10-10 DIAGNOSIS — Z8249 Family history of ischemic heart disease and other diseases of the circulatory system: Secondary | ICD-10-CM

## 2018-10-10 DIAGNOSIS — K922 Gastrointestinal hemorrhage, unspecified: Secondary | ICD-10-CM | POA: Diagnosis present

## 2018-10-10 DIAGNOSIS — D62 Acute posthemorrhagic anemia: Secondary | ICD-10-CM | POA: Diagnosis present

## 2018-10-10 DIAGNOSIS — I10 Essential (primary) hypertension: Secondary | ICD-10-CM | POA: Diagnosis present

## 2018-10-10 DIAGNOSIS — Z91018 Allergy to other foods: Secondary | ICD-10-CM

## 2018-10-10 LAB — CBC
HCT: 18.9 % — ABNORMAL LOW (ref 36.0–46.0)
Hemoglobin: 5.5 g/dL — ABNORMAL LOW (ref 12.0–15.0)
MCH: 26.4 pg (ref 26.0–34.0)
MCHC: 29.1 g/dL — AB (ref 30.0–36.0)
MCV: 90.9 fL (ref 80.0–100.0)
Platelets: 117 10*3/uL — ABNORMAL LOW (ref 150–400)
RBC: 2.08 MIL/uL — ABNORMAL LOW (ref 3.87–5.11)
RDW: 18.8 % — AB (ref 11.5–15.5)
WBC: 6.8 10*3/uL (ref 4.0–10.5)
nRBC: 0 % (ref 0.0–0.2)

## 2018-10-10 LAB — BASIC METABOLIC PANEL
Anion gap: 14 (ref 5–15)
BUN: 54 mg/dL — AB (ref 8–23)
CALCIUM: 9.5 mg/dL (ref 8.9–10.3)
CO2: 38 mmol/L — ABNORMAL HIGH (ref 22–32)
Chloride: 89 mmol/L — ABNORMAL LOW (ref 98–111)
Creatinine, Ser: 2.26 mg/dL — ABNORMAL HIGH (ref 0.44–1.00)
GFR calc Af Amer: 26 mL/min — ABNORMAL LOW (ref >60)
GFR calc non Af Amer: 22 mL/min — ABNORMAL LOW (ref >60)
Glucose, Bld: 218 mg/dL — ABNORMAL HIGH (ref 70–99)
Potassium: 3.3 mmol/L — ABNORMAL LOW (ref 3.5–5.1)
SODIUM: 141 mmol/L (ref 135–145)

## 2018-10-10 LAB — TROPONIN I: Troponin I: 0.06 ng/mL (ref <0.03)

## 2018-10-10 MED ORDER — DIPHENHYDRAMINE HCL 25 MG PO CAPS
50.0000 mg | ORAL_CAPSULE | Freq: Once | ORAL | Status: AC
  Administered 2018-10-10: 50 mg via ORAL
  Filled 2018-10-10: qty 2

## 2018-10-10 MED ORDER — PANTOPRAZOLE SODIUM 40 MG IV SOLR: 40.0000 mg | Freq: Two times a day (BID) | INTRAVENOUS | Status: DC

## 2018-10-10 MED ORDER — SODIUM CHLORIDE 0.9 % IV SOLN
8.0000 mg/h | INTRAVENOUS | Status: DC
  Administered 2018-10-11 – 2018-10-12 (×3): 8 mg/h via INTRAVENOUS
  Filled 2018-10-10 (×3): qty 80

## 2018-10-10 MED ORDER — SODIUM CHLORIDE 0.9% FLUSH: 3.0000 mL | Freq: Once | INTRAVENOUS | Status: DC

## 2018-10-10 MED ORDER — SODIUM CHLORIDE 0.9 % IV SOLN
10.0000 mL/h | Freq: Once | INTRAVENOUS | Status: AC
  Administered 2018-10-11: 10 mL/h via INTRAVENOUS

## 2018-10-10 MED ORDER — ACETAMINOPHEN 500 MG PO TABS
500.0000 mg | ORAL_TABLET | Freq: Once | ORAL | Status: AC
  Administered 2018-10-10: 500 mg via ORAL
  Filled 2018-10-10: qty 1

## 2018-10-10 MED ORDER — SODIUM CHLORIDE 0.9 % IV SOLN
80.0000 mg | Freq: Once | INTRAVENOUS | Status: AC
  Administered 2018-10-11: 80 mg via INTRAVENOUS
  Filled 2018-10-10: qty 80

## 2018-10-10 NOTE — ED Notes (Signed)
Pt brought in by ems from home with heart palpitations and sob.  Dx with bronchitis.  Pt has not filled rx.  Non smoker.  No fever.  Sx since last night.  Pt alert.  Speech clear.  Iv in place.  Pt in hallway bed

## 2018-10-10 NOTE — ED Notes (Signed)
Pt moved to room 17.  Report off to kala rn

## 2018-10-10 NOTE — H&P (Signed)
[facility] NAME: Andrea Rivers    MR#:  [id number]  DATE OF BIRTH:  November 19, 1955  DATE OF ADMISSION:  10/10/2018  PRIMARY CARE PHYSICIAN: Sofie Hartigan, MD   REQUESTING/REFERRING PHYSICIAN: Archie Balboa, MD  CHIEF COMPLAINT:   Chief Complaint  Patient presents with  . Palpitations    HISTORY OF PRESENT ILLNESS:  Andrea Rivers  is a 63 y.o. female who presents with chief complaint as above.  Patient presents the ED with a complaint of chest discomfort and shortness of breath.  She has a history of CAD, but was also found in the ED to have a hemoglobin of 5.5.  She states that she has chronic A. fib and is on warfarin for the same.  She also states that she is not been taking her Protonix for the last 6 months.  Her hemoglobin has been continually drifting down and she has been getting iron infusions in the outpatient setting.  About 2 weeks ago her hemoglobin was 11.  Strong suspicion for possible GI bleed.  She was guaiac positive in the ED.  Hospitalist were called for admission and further evaluation and treatment  PAST MEDICAL HISTORY:   Past Medical History:  Diagnosis Date  . Anemia   . Anxiety   . CHF (congestive heart failure) ([place])   . Chronic kidney disease    INSUFFICIENCY  . Complication of anesthesia    had to be intubated during cataract surgery   " I could not breath "  . COPD (chronic obstructive pulmonary disease) ([place])   . Diabetes mellitus without complication ([place])   . Dyspnea    DOE  . Dysrhythmia    A FIB  . Edema   . Fracture of distal femur ([place]) 11/2017   left   . GERD (gastroesophageal reflux disease)   . History of kidney stones   . Hypertension   . Hypothyroidism    ABLATION  . Iron deficiency anemia 03/31/2017  . Neuropathy   . Orthopnea   . Sleep apnea    CPAP  . Vertigo   . Wheezing      PAST SURGICAL HISTORY:   Past Surgical History:  Procedure Laterality Date  .  CATARACT EXTRACTION W/PHACO Left 01/26/2017   Procedure: CATARACT EXTRACTION PHACO AND INTRAOCULAR LENS PLACEMENT (IOC);  Surgeon: Estill Cotta, MD;  Location: [facility] OR[facility];  Service: Ophthalmology;  Laterality: Left;  [place]   1:02.2 AP     23.7 CDE   28.92 fluid pack lot# [id number] H exp.05/08/2018  . CHOLECYSTECTOMY    . ORIF FEMUR FRACTURE Left 11/17/2017   Procedure: OPEN REDUCTION INTERNAL FIXATION (ORIF) DISTAL FEMUR FRACTURE;  Surgeon: Shona Needles, MD;  Location: [facility];  Service: Orthopedics;  Laterality: Left;  . TONSILLECTOMY       SOCIAL HISTORY:   Social History   Tobacco Use  . Smoking status: Former Smoker    Packs/day: 3.00    Types: Cigarettes    Last attempt to quit: 1988    Years since quitting: 32.1  . Smokeless tobacco: Never Used  Substance Use Topics  . Alcohol use: No     FAMILY HISTORY:   Family History  Problem Relation Age of Onset  . COPD Mother   . Heart disease Mother   . Anemia Mother   . Heart disease Father   . COPD Father   . Anemia Sister   . Diabetes Maternal Grandmother   . Hypertension  Paternal Grandfather      DRUG ALLERGIES:   Allergies  Allergen Reactions  . Other Other (See Comments)    ILOSONE- Caused GI distress also  . Latex Hives  . Exenatide Nausea Only and Other (See Comments)    [place]- Nausea and abdominal pain, also  . Garlic Other (See Comments)    Severe acid reflux  . Onion Other (See Comments)    Severe acid reflux  . Rosiglitazone Other (See Comments)    [place]- Affected heart  . Erythromycin Nausea And Vomiting    GI DISTRESS    MEDICATIONS AT HOME:   Prior to Admission medications   Medication Sig Start Date End Date Taking? Authorizing Provider  acetaminophen (TYLENOL) 500 MG tablet Take 1,000 mg by mouth 2 (two) times daily as needed.    [provider]  albuterol (PROAIR HFA) 108 (90 Base) MCG/ACT inhaler inhale 2 puffs by mouth INTO LUNGS every 4 to 6 hours if needed for wheezing or  shortness of breath 03/14/17   [provider]  albuterol (PROVENTIL) (2.5 MG/3ML) 0.083% nebulizer solution Inhale 3 mLs (2.5 mg total) into the lungs every 4 (four) hours as needed for wheezing or shortness of breath. 11/21/17   Sheikh, Georgina Quint Latif, DO  Amino Acids-Protein Hydrolys (FEEDING SUPPLEMENT, PRO-STAT SUGAR FREE 64,) LIQD Take 30 mLs by mouth 3 (three) times daily with meals.    [provider]  amiodarone (PACERONE) 200 MG tablet Take 200 mg by mouth daily. 05/10/17   [provider]  amoxicillin-clavulanate (AUGMENTIN) 875-125 MG tablet Take 1 tablet by mouth 2 (two) times daily. 10/02/18   Norval Gable, MD  azelastine (ASTELIN) 0.1 % nasal spray Place 2 sprays into both nostrils 2 (two) times daily as needed.  10/24/17   [provider]  Biotin 10 MG CAPS Take by mouth.    [provider]  Azucena Freed Serrata (BOSWELLIA PO) Take by mouth.    [provider]  Calcium Carbonate-Vitamin D3 (CALCIUM 600-D) 600-400 MG-UNIT TABS Take 1 tablet by mouth daily.    [provider]  Cholecalciferol (D3 HIGH POTENCY) 125 MCG (5000 UT) capsule Take 5,000 Units by mouth daily.     [provider]  citalopram (CELEXA) 20 MG tablet Take 40 mg by mouth daily.     [provider]  clonazePAM (KLONOPIN) 1 MG tablet Take 1 tablet by mouth 2 (two) times daily as needed.  05/17/18   [provider]  cloNIDine (CATAPRES) 0.1 MG tablet Take 1 tablet (0.1 mg total) by mouth 2 (two) times daily. 07/12/17   Alisa Graff, FNP  Co-Enzyme Q-10 100 MG CAPS Take 100 mg by mouth daily.     [provider]  cyclobenzaprine (FLEXERIL) 5 MG tablet Take 5 mg by mouth 3 (three) times daily as needed for muscle spasms.    [provider]  docusate sodium (COLACE) 50 MG capsule Take 100 mg by mouth 2 (two) times daily.    [provider]  Fluticasone-Salmeterol (WIXELA INHUB) 250-50 MCG/DOSE AEPB Inhale 1 puff into  the lungs 2 (two) times daily.    [provider]  gabapentin (NEURONTIN) 100 MG capsule Take 100 mg by mouth 3 (three) times daily.  09/05/17 09/05/18  [provider]  Garlic 1062 MG CAPS Take 1,000 mg by mouth daily.     [provider]  glimepiride (AMARYL) 2 MG tablet Take 2 mg by mouth daily with breakfast.    [provider]  HYDROcodone-acetaminophen (NORCO) 10-325 MG tablet Take 1 tablet by mouth every 6 (six) hours as needed.    [provider]  Insulin Detemir (LEVEMIR FLEXTOUCH) 100 UNIT/ML Pen Inject 23 Units into the skin at bedtime.  03/01/18   [provider]  insulin lispro (HUMALOG) 100 UNIT/ML injection Inject into the skin 3 (three) times daily before meals.    [provider]  levothyroxine (SYNTHROID, LEVOTHROID) 175 MCG tablet Take 175 mcg by mouth every evening.     [provider]  linagliptin (TRADJENTA) 5 MG TABS tablet take 1 tablet by mouth once daily 01/03/18   [provider]  loratadine (CLARITIN) 10 MG tablet Take 1 tablet (10 mg total) by mouth daily. 11/22/17   Sheikh, Omair Latif, DO  LORazepam (ATIVAN) 1 MG tablet Take 1 tablet (1 mg total) by mouth 2 (two) times daily. 02/06/18 02/06/19  Earleen Newport, MD  lovastatin (MEVACOR) 20 MG tablet Take 20 mg by mouth at bedtime.    [provider]  magnesium oxide (MAG-OX) 400 MG tablet Take 400 mg by mouth 2 (two) times daily.    [provider]  meclizine (ANTIVERT) 12.5 MG tablet Take 1 tablet (12.5 mg total) by mouth 3 (three) times daily as needed for dizziness. 03/03/17   Gladstone Lighter, MD  metolazone (ZAROXOLYN) 5 MG tablet Take 5 mg by mouth as needed (for a weight gain of 2 pounds overnight or 5 pounds in a week; MAX OF 5 TABLETS AT A TIME).  04/18/17   [provider]  Multiple Vitamins-Minerals (CENTRUM WOMEN) TABS Take 1 tablet by mouth daily.     [provider]  Omega-3 Fatty Acids (FISH  OIL) 1000 MG CPDR Take 1 capsule by mouth daily.    [provider]  pantoprazole (PROTONIX) 40 MG tablet Take 40 mg by mouth 2 (two) times daily as needed.     [provider]  polyethylene glycol (MIRALAX / GLYCOLAX) packet Take 17 g by mouth 2 (two) times daily. 11/21/17   Raiford Noble Latif, DO  potassium chloride SA (K-DUR,KLOR-CON) 20 MEQ tablet Take 2 tablets (40 mEq total) by mouth daily. 06/07/18   Alisa Graff, FNP  senna (SENOKOT) 8.6 MG TABS tablet Take 2 tablets by mouth 2 (two) times daily.    [provider]  senna-docusate (SENOKOT-S) 8.6-50 MG tablet Take 1 tablet by mouth at bedtime as needed for mild constipation. 11/21/17   Raiford Noble Latif, DO  sodium phosphate (FLEET) 7-19 GM/118ML ENEM Place 133 mLs (1 enema total) rectally once as needed for severe constipation. 11/21/17   Raiford Noble Latif, DO  torsemide (DEMADEX) 20 MG tablet TAKE 2 TABLETS BY MOUTH TWICE A DAY 09/07/18   Darylene Price A, FNP  vitamin C (ASCORBIC ACID) 500 MG tablet Take 500 mg by mouth daily.    [provider]  warfarin (COUMADIN) 1 MG tablet Take 1 mg daily at 6 PM by mouth.     [provider]  warfarin (COUMADIN) 5 MG tablet Take 5 mg by mouth daily at 6 PM.  04/22/17   [provider]  zinc sulfate 220 (50 Zn) MG capsule Take 220 mg by mouth daily.    [provider]    REVIEW OF SYSTEMS:  Review of Systems  Constitutional: Negative for chills, fever, malaise/fatigue and weight loss.  HENT: Negative for ear pain, hearing loss and tinnitus.   Eyes: Negative for blurred vision, double vision, pain and redness.  Respiratory: Positive for shortness of breath. Negative for cough and hemoptysis.   Cardiovascular: Positive for chest pain. Negative for palpitations, orthopnea and leg swelling.  Gastrointestinal: Negative for abdominal pain, constipation, diarrhea, nausea and vomiting.  Genitourinary: Negative for dysuria, frequency and  hematuria.  Musculoskeletal: Negative for back pain, joint pain and neck pain.  Skin:       No acne, rash, or lesions  Neurological: Negative for dizziness, tremors, focal weakness and weakness.  Endo/Heme/Allergies: Negative for polydipsia. Does not bruise/bleed easily.  Psychiatric/Behavioral: Negative for depression. The patient is not nervous/anxious and does not have insomnia.      VITAL SIGNS:   Vitals:   10/10/18 2051 10/10/18 2053 10/10/18 2055 10/10/18 2249  BP:   (!) 134/54   Pulse: 67   64  Resp: 20     Temp: 98.1 F (36.7 C)     TempSrc: Oral     SpO2: (!) 80%  100%   Weight:  (!) 148 kg    Height:  5\' 5"  (1.651 m)     Wt Readings from Last 3 Encounters:  10/10/18 (!) 148 kg  10/02/18 (!) 149.7 kg  08/09/18 (!) 149.7 kg    PHYSICAL EXAMINATION:  Physical Exam  Vitals reviewed. Constitutional: She is oriented to person, place, and time. She appears well-developed and well-nourished. No distress.  HENT:  Head: Normocephalic and atraumatic.  Mouth/Throat: Oropharynx is clear and moist.  Eyes: Pupils are equal, round, and reactive to light. EOM are normal. No scleral icterus.  Pale conjunctiva  Neck: Normal range of motion. Neck supple. No JVD present. No thyromegaly present.  Cardiovascular: Normal rate, regular rhythm and intact distal pulses. Exam reveals no gallop and no friction rub.  No murmur heard. Respiratory: Effort normal and breath sounds normal. No respiratory distress. She has no wheezes. She has no rales.  GI: Soft. Bowel sounds are normal. She exhibits no distension. There is no abdominal tenderness.  Musculoskeletal: Normal range of motion.        General: No edema.     Comments: No arthritis, no gout  Lymphadenopathy:    She has no cervical adenopathy.  Neurological: She is alert and oriented to person, place, and time. No cranial nerve deficit.  No dysarthria, no aphasia  Skin: Skin is warm and dry. No rash noted. No erythema.   Psychiatric: She has a normal mood and affect. Her behavior is normal. Judgment and thought content normal.    LABORATORY PANEL:   CBC Recent Labs  Lab 10/10/18 2053  WBC 6.8  HGB 5.5*  HCT 18.9*  PLT 117*   ------------------------------------------------------------------------------------------------------------------  Chemistries  Recent Labs  Lab 10/10/18 2053  NA 141  K 3.3*  CL 89*  CO2 38*  GLUCOSE 218*  BUN 54*  CREATININE 2.26*  CALCIUM 9.5   ------------------------------------------------------------------------------------------------------------------  Cardiac Enzymes Recent Labs  Lab 10/10/18 2053  TROPONINI 0.06*   ------------------------------------------------------------------------------------------------------------------  RADIOLOGY:  Dg Chest 2 View  Result Date: 10/10/2018 CLINICAL DATA:  Shortness of breath. EXAM: CHEST - 2 VIEW COMPARISON:  Chest radiograph August 09, 2018 FINDINGS: Mild cardiomegaly. Mediastinal silhouette is not suspicious. No pleural effusion or focal consolidation. No pneumothorax. Mild thoracic spondylosis. Large body habitus. IMPRESSION: Mild cardiomegaly.  No acute pulmonary process. Electronically Signed   By: Elon Alas M.D.   On: 10/10/2018 21:20    EKG:   Orders placed or performed during the hospital encounter of 10/10/18  . ED EKG within 10 minutes  . ED  EKG within 10 minutes    IMPRESSION AND PLAN:  Principal Problem:   GI bleed -PRBC transfusion ordered in the ED, though she may need a second unit.  I suspect this is likely due to stopping her Protonix in conjunction with her blood thinner.  We will get a GI consult, and keep her on PPI drip tonight Active Problems:   Chest pain -I suspect this is demand ischemia given her significant anemia.  Transfusing blood tonight.  We will trend her troponin and get a cardiology consult   Chronic diastolic heart failure ([place]) -continue home meds   HTN  (hypertension) -home dose antihypertensives   Diabetes ([facility]) -sliding scale insulin coverage   Atrial fibrillation ([facility]) -hold warfarin for now, continue other meds   CKD (chronic kidney disease) -at baseline, avoid nephrotoxins and monitor   Hypothyroidism -home dose thyroid replacement  Chart review performed and case discussed with ED provider. Labs, imaging and/or ECG reviewed by provider and discussed with patient/family. Management plans discussed with the patient and/or family.  DVT PROPHYLAXIS: Mechanical only  GI PROPHYLAXIS:  PPI   ADMISSION STATUS: Inpatient     CODE STATUS: Full Code Status History    Date Active Date Inactive Code Status Order ID Comments User Context   11/14/2017 1816 11/21/2017 2158 Full Code [id number]  Elease Hashimoto Inpatient   02/22/2017 1003 03/03/2017 1712 Full Code [id number]  Dustin Flock, MD Inpatient   01/26/2017 1636 01/28/2017 1234 Full Code [id number]  Baxter Hire, MD Inpatient      TOTAL TIME TAKING CARE OF THIS PATIENT: 45 minutes.   Ethlyn Daniels 10/10/2018, 11:16 PM  [facility]  Office  [phone]  CC: Primary care physician; Sofie Hartigan, MD  Note:  This document was prepared using Dragon voice recognition software and may include unintentional dictation errors.

## 2018-10-10 NOTE — ED Provider Notes (Signed)
[facility] Emergency Department Provider Note    ____________________________________________   I have reviewed the triage vital signs and the nursing notes.   HISTORY  Chief Complaint Palpitations   History limited by: Not Limited   HPI Andrea Rivers is a 63 y.o. female who presents to the emergency department today because of concerns for possible A. fib.  Patient states that she has A. fib that would come and go.  She has however for the past 3 weeks been dealing with bronchitis.  She states she has been feeling very short of breath.  She has seen her primary care doctor for this and is finished up a course of Augmentin.  When she revisited them they prescribed her prednisone and another antibiotic although she has not been able to start this today.  Patient denies any fevers.  She has had some chest tightness.   Per medical record review patient has a history of anemia  Past Medical History:  Diagnosis Date  . Anemia   . Anxiety   . CHF (congestive heart failure) ([place])   . Chronic kidney disease    INSUFFICIENCY  . Complication of anesthesia    had to be intubated during cataract surgery   " I could not breath "  . COPD (chronic obstructive pulmonary disease) ([place])   . Diabetes mellitus without complication ([place])   . Dyspnea    DOE  . Dysrhythmia    A FIB  . Edema   . Fracture of distal femur ([place]) 11/2017   left   . GERD (gastroesophageal reflux disease)   . History of kidney stones   . Hypertension   . Hypothyroidism    ABLATION  . Iron deficiency anemia 03/31/2017  . Neuropathy   . Orthopnea   . Sleep apnea    CPAP  . Vertigo   . Wheezing     Patient Active Problem List   Diagnosis Date Noted  . Hypokalemia 06/08/2018  . Lymphedema 06/08/2018  . Closed displaced supracondylar fracture of distal end of left femur with intracondylar extension ([place]) 11/17/2017  . CKD (chronic kidney disease) 11/14/2017  . Hypothyroidism  11/14/2017  . Atrial fibrillation ([place]) 11/14/2017  . Anxiety state 11/14/2017  . Peripheral neuropathy 11/14/2017  . Fracture 11/14/2017  . Bradycardia 08/13/2017  . Microcytic anemia 03/31/2017  . Iron deficiency anemia 03/31/2017  . Chronic diastolic heart failure ([place]) 03/10/2017  . HTN (hypertension) 03/10/2017  . Diabetes ([place]) 03/10/2017  . Obstructive sleep apnea 03/10/2017  . Shock [facility])     Past Surgical History:  Procedure Laterality Date  . CATARACT EXTRACTION W/PHACO Left 01/26/2017   Procedure: CATARACT EXTRACTION PHACO AND INTRAOCULAR LENS PLACEMENT (IOC);  Surgeon: Estill Cotta, MD;  Location: [facility] ORS;  Service: Ophthalmology;  Laterality: Left;  [place]   1:02.2 AP     23.7 CDE   28.92 fluid pack lot# [id number] H exp.05/08/2018  . CHOLECYSTECTOMY    . ORIF FEMUR FRACTURE Left 11/17/2017   Procedure: OPEN REDUCTION INTERNAL FIXATION (ORIF) DISTAL FEMUR FRACTURE;  Surgeon: Shona Needles, MD;  Location: [place];  Service: Orthopedics;  Laterality: Left;  . TONSILLECTOMY      Prior to Admission medications   Medication Sig Start Date End Date Taking? Authorizing Provider  acetaminophen (TYLENOL) 500 MG tablet Take 1,000 mg by mouth 2 (two) times daily as needed.    [provider]  albuterol (PROAIR HFA) 108 (90 Base) MCG/ACT inhaler inhale 2 puffs by mouth INTO LUNGS  every 4 to 6 hours if needed for wheezing or shortness of breath 03/14/17   [provider]  albuterol (PROVENTIL) (2.5 MG/3ML) 0.083% nebulizer solution Inhale 3 mLs (2.5 mg total) into the lungs every 4 (four) hours as needed for wheezing or shortness of breath. 11/21/17   Sheikh, Georgina Quint Latif, DO  Amino Acids-Protein Hydrolys (FEEDING SUPPLEMENT, PRO-STAT SUGAR FREE 64,) LIQD Take 30 mLs by mouth 3 (three) times daily with meals.    [provider]  amiodarone (PACERONE) 200 MG tablet Take 200 mg by mouth daily. 05/10/17   [provider]  amoxicillin-clavulanate (AUGMENTIN)  875-125 MG tablet Take 1 tablet by mouth 2 (two) times daily. 10/02/18   Norval Gable, MD  azelastine (ASTELIN) 0.1 % nasal spray Place 2 sprays into both nostrils 2 (two) times daily as needed.  10/24/17   [provider]  Biotin 10 MG CAPS Take by mouth.    [provider]  Azucena Freed [place]errata (BOSWELLIA PO) Take by mouth.    [provider]  Calcium Carbonate-Vitamin D3 (CALCIUM 600-D) 600-400 MG-UNIT TABS Take 1 tablet by mouth daily.    [provider]  Cholecalciferol (D3 HIGH POTENCY) 125 MCG (5000 UT) capsule Take 5,000 Units by mouth daily.     [provider]  citalopram (CELEXA) 20 MG tablet Take 40 mg by mouth daily.     [provider]  clonazePAM (KLONOPIN) 1 MG tablet Take 1 tablet by mouth 2 (two) times daily as needed.  05/17/18   [provider]  cloNIDine (CATAPRES) 0.1 MG tablet Take 1 tablet (0.1 mg total) by mouth 2 (two) times daily. 07/12/17   Alisa Graff, FNP  Co-Enzyme Q-10 100 MG CAPS Take 100 mg by mouth daily.     [provider]  cyclobenzaprine (FLEXERIL) 5 MG tablet Take 5 mg by mouth 3 (three) times daily as needed for muscle spasms.    [provider]  docusate sodium (COLACE) 50 MG capsule Take 100 mg by mouth 2 (two) times daily.    [provider]  Fluticasone-Salmeterol (WIXELA INHUB) 250-50 MCG/DOSE AEPB Inhale 1 puff into the lungs 2 (two) times daily.    [provider]  gabapentin (NEURONTIN) 100 MG capsule Take 100 mg by mouth 3 (three) times daily.  09/05/17 09/05/18  [provider]  Garlic 4496 MG CAPS Take 1,000 mg by mouth daily.     [provider]  glimepiride (AMARYL) 2 MG tablet Take 2 mg by mouth daily with breakfast.    [provider]  HYDROcodone-acetaminophen (NORCO) 10-325 MG tablet Take 1 tablet by mouth every 6 (six) hours as needed.    [provider]  Insulin Detemir (LEVEMIR FLEXTOUCH) 100 UNIT/ML Pen  Inject 23 Units into the skin at bedtime.  03/01/18   [provider]  insulin lispro (HUMALOG) 100 UNIT/ML injection Inject into the skin 3 (three) times daily before meals.    [provider]  levothyroxine (SYNTHROID, LEVOTHROID) 175 MCG tablet Take 175 mcg by mouth every evening.     [provider]  linagliptin (TRADJENTA) 5 MG TABS tablet take 1 tablet by mouth once daily 01/03/18   [provider]  loratadine (CLARITIN) 10 MG tablet Take 1 tablet (10 mg total) by mouth daily. 11/22/17   Sheikh, Omair Latif, DO  LORazepam (ATIVAN) 1 MG tablet Take 1 tablet (1 mg total) by mouth 2 (two) times daily. 02/06/18 02/06/19  Earleen Newport, MD  lovastatin (MEVACOR)  20 MG tablet Take 20 mg by mouth at bedtime.    [provider]  magnesium oxide (MAG-OX) 400 MG tablet Take 400 mg by mouth 2 (two) times daily.    [provider]  meclizine (ANTIVERT) 12.5 MG tablet Take 1 tablet (12.5 mg total) by mouth 3 (three) times daily as needed for dizziness. 03/03/17   Gladstone Lighter, MD  metolazone (ZAROXOLYN) 5 MG tablet Take 5 mg by mouth as needed (for a weight gain of 2 pounds overnight or 5 pounds in a week; MAX OF 5 TABLETS AT A TIME).  04/18/17   [provider]  Multiple Vitamins-Minerals (CENTRUM WOMEN) TABS Take 1 tablet by mouth daily.     [provider]  Omega-3 Fatty Acids (FISH OIL) 1000 MG CPDR Take 1 capsule by mouth daily.    [provider]  pantoprazole (PROTONIX) 40 MG tablet Take 40 mg by mouth 2 (two) times daily as needed.     [provider]  polyethylene glycol (MIRALAX / GLYCOLAX) packet Take 17 g by mouth 2 (two) times daily. 11/21/17   Raiford Noble Latif, DO  potassium chloride SA (K-DUR,KLOR-CON) 20 MEQ tablet Take 2 tablets (40 mEq total) by mouth daily. 06/07/18   Alisa Graff, FNP  senna (SENOKOT) 8.6 MG TABS tablet Take 2 tablets by mouth 2 (two) times daily.    [provider]   senna-docusate (SENOKOT-S) 8.6-50 MG tablet Take 1 tablet by mouth at bedtime as needed for mild constipation. 11/21/17   Raiford Noble Latif, DO  sodium phosphate (FLEET) 7-19 GM/118ML ENEM Place 133 mLs (1 enema total) rectally once as needed for severe constipation. 11/21/17   Raiford Noble Latif, DO  torsemide (DEMADEX) 20 MG tablet TAKE 2 TABLETS BY MOUTH TWICE A DAY 09/07/18   Darylene Price A, FNP  vitamin C (ASCORBIC ACID) 500 MG tablet Take 500 mg by mouth daily.    [provider]  warfarin (COUMADIN) 1 MG tablet Take 1 mg daily at 6 PM by mouth.     [provider]  warfarin (COUMADIN) 5 MG tablet Take 5 mg by mouth daily at 6 PM.  04/22/17   [provider]  zinc sulfate 220 (50 Zn) MG capsule Take 220 mg by mouth daily.    [provider]    Allergies Other; Latex; Exenatide; Garlic; Onion; Rosiglitazone; and Erythromycin  Family History  Problem Relation Age of Onset  . COPD Mother   . Heart disease Mother   . Anemia Mother   . Heart disease Father   . COPD Father   . Anemia Sister   . Diabetes Maternal Grandmother   . Hypertension Paternal Grandfather     Social History Social History   Tobacco Use  . Smoking status: Former Smoker    Packs/day: 3.00    Types: Cigarettes    Last attempt to quit: 1988    Years since quitting: 32.1  . Smokeless tobacco: Never Used  Substance Use Topics  . Alcohol use: No  . Drug use: No    Review of Systems Constitutional: No fever/chills Eyes: No visual changes. ENT: No sore throat. Cardiovascular: Positive for chest tightness and palpitations. Respiratory: Positive for shortness of breath and cough. Gastrointestinal: No abdominal pain.  No nausea, no vomiting.  No diarrhea.   Genitourinary: Negative for dysuria. Musculoskeletal: Negative for back pain. Skin: Negative for rash. Neurological: Negative for headaches, focal weakness or  numbness.  ____________________________________________   PHYSICAL EXAM:  VITAL SIGNS: ED Triage Vitals  Enc Vitals Group     BP 134/54     Pulse 67     Resp 20     Temp 98.1     Temp src      SpO2 80   Constitutional: Alert and oriented.  Eyes: Conjunctivae are normal.  ENT      Head: Normocephalic and atraumatic.      Nose: No congestion/rhinnorhea.      Mouth/Throat: Mucous membranes are moist.      Neck: No stridor. Hematological/Lymphatic/Immunilogical: No cervical lymphadenopathy. Cardiovascular: Normal rate, regular rhythm.  No murmurs, rubs, or gallops.  Respiratory: Normal respiratory effort without tachypnea nor retractions. Mild expiratory wheezing.  Gastrointestinal: Soft and non tender. No rebound. No guarding.  Genitourinary: Deferred Musculoskeletal: Normal range of motion in all extremities. No lower extremity edema. Neurologic:  Normal speech and language. No gross focal neurologic deficits are appreciated.  Skin:  Skin is warm, dry and intact. No rash noted. Psychiatric: Mood and affect are normal. Speech and behavior are normal. Patient exhibits appropriate insight and judgment.  ____________________________________________    LABS (pertinent positives/negatives)  Trop 0.06 CBC wbc 6.8, hgb 5.5, plt 11 UK7 BMP na 141, k 3.3, cr 2.26, glu 218  ____________________________________________   EKG  I, Nance Pear, attending physician, personally viewed and interpreted this EKG  EKG Time: 2048 Rate: 70 Rhythm: sinus rhythm Axis: normal Intervals: qtc 425 QRS: narrow, q waves v1, v2, v3 ST changes: no st elevation Impression: abnormal ekg   ____________________________________________    RADIOLOGY  CXR Mild cardiomegaly. No acute process  ____________________________________________   PROCEDURES  Procedures  CRITICAL CARE Performed by: Nance Pear   Total critical care time: 35 minutes  Critical care time was  exclusive of separately billable procedures and treating other patients.  Critical care was necessary to treat or prevent imminent or life-threatening deterioration.  Critical care was time spent personally by me on the following activities: development of treatment plan with patient and/or surrogate as well as nursing, discussions with consultants, evaluation of patient's response to treatment, examination of patient, obtaining history from patient or surrogate, ordering and performing treatments and interventions, ordering and review of laboratory studies, ordering and review of radiographic studies, pulse oximetry and re-evaluation of patient's condition.  ____________________________________________   INITIAL IMPRESSION / ASSESSMENT AND PLAN / ED COURSE  Pertinent labs & imaging results that were available during my care of the patient were reviewed by me and considered in my medical decision making (see chart for details).   Patient presented to the emergency department today because of concerns for possible A. fib and she also had concern for continued bronchitis and shortness of breath.  On exam she did have some wheezing noted in bilateral lungs.  Patient does have a history of heart failure.  X-ray however without any edema or pneumonia.  Patient's blood work was notable for hemoglobin of 5.5.  Patient is on Eliquis and was guaiac positive.  I do think the anemia could be playing a large role in the patient's shortness of breath.  Discussed this with the patient.  Did consent patient for blood transfusions.  Will plan on admission.  ____________________________________________   FINAL CLINICAL IMPRESSION(S) / ED DIAGNOSES  Final diagnoses:  Palpitations  Gastrointestinal hemorrhage, unspecified gastrointestinal hemorrhage type  Anemia, unspecified type     Note: This dictation was prepared with Dragon dictation. Any transcriptional errors that result from this process are  unintentional  Nance Pear, MD 10/10/18 2352

## 2018-10-10 NOTE — ED Triage Notes (Signed)
Pt brought in via ems from home with heart palpatitions.  Pt reports sob.  Pt has chest tightness when coughing  Iv in place  Pt alert.

## 2018-10-11 ENCOUNTER — Inpatient Hospital Stay: Payer: Medicare Other | Admitting: Urgent Care

## 2018-10-11 ENCOUNTER — Inpatient Hospital Stay: Payer: Medicare Other

## 2018-10-11 ENCOUNTER — Other Ambulatory Visit: Payer: Self-pay

## 2018-10-11 LAB — BASIC METABOLIC PANEL
Anion gap: 12 (ref 5–15)
BUN: 50 mg/dL — ABNORMAL HIGH (ref 8–23)
CO2: 37 mmol/L — ABNORMAL HIGH (ref 22–32)
Calcium: 9.1 mg/dL (ref 8.9–10.3)
Chloride: 91 mmol/L — ABNORMAL LOW (ref 98–111)
Creatinine, Ser: 1.99 mg/dL — ABNORMAL HIGH (ref 0.44–1.00)
GFR calc non Af Amer: 26 mL/min — ABNORMAL LOW (ref >60)
GFR, EST AFRICAN AMERICAN: 30 mL/min — AB (ref >60)
Glucose, Bld: 162 mg/dL — ABNORMAL HIGH (ref 70–99)
Potassium: 2.8 mmol/L — ABNORMAL LOW (ref 3.5–5.1)
Sodium: 140 mmol/L (ref 135–145)

## 2018-10-11 LAB — MAGNESIUM: Magnesium: 2.6 mg/dL — ABNORMAL HIGH (ref 1.7–2.4)

## 2018-10-11 LAB — CBC
HCT: 37 % (ref 36.0–46.0)
Hemoglobin: 11.3 g/dL — ABNORMAL LOW (ref 12.0–15.0)
MCH: 26.4 pg (ref 26.0–34.0)
MCHC: 30.5 g/dL (ref 30.0–36.0)
MCV: 86.4 fL (ref 80.0–100.0)
PLATELETS: 212 10*3/uL (ref 150–400)
RBC: 4.28 MIL/uL (ref 3.87–5.11)
RDW: 18.6 % — ABNORMAL HIGH (ref 11.5–15.5)
WBC: 11.4 10*3/uL — ABNORMAL HIGH (ref 4.0–10.5)

## 2018-10-11 LAB — PROTIME-INR
INR: 1.6 — ABNORMAL HIGH (ref 0.8–1.2)
Prothrombin Time: 18.4 seconds — ABNORMAL HIGH (ref 11.4–15.2)

## 2018-10-11 LAB — GLUCOSE, CAPILLARY
Glucose-Capillary: 141 mg/dL — ABNORMAL HIGH (ref 70–99)
Glucose-Capillary: 171 mg/dL — ABNORMAL HIGH (ref 70–99)
Glucose-Capillary: 193 mg/dL — ABNORMAL HIGH (ref 70–99)

## 2018-10-11 MED ORDER — SENNA 8.6 MG PO TABS
1.0000 | ORAL_TABLET | Freq: Two times a day (BID) | ORAL | Status: DC
  Administered 2018-10-11 – 2018-10-12 (×2): 8.6 mg via ORAL
  Filled 2018-10-11 (×2): qty 1

## 2018-10-11 MED ORDER — ACETAMINOPHEN 325 MG PO TABS
650.0000 mg | ORAL_TABLET | Freq: Four times a day (QID) | ORAL | Status: DC | PRN
  Filled 2018-10-11 (×2): qty 2

## 2018-10-11 MED ORDER — MOMETASONE FURO-FORMOTEROL FUM 200-5 MCG/ACT IN AERO
2.0000 | INHALATION_SPRAY | Freq: Two times a day (BID) | RESPIRATORY_TRACT | Status: DC
  Administered 2018-10-11 – 2018-10-12 (×2): 2 via RESPIRATORY_TRACT
  Filled 2018-10-11 (×2): qty 8.8

## 2018-10-11 MED ORDER — BENZONATATE 100 MG PO CAPS
100.0000 mg | ORAL_CAPSULE | Freq: Three times a day (TID) | ORAL | Status: DC
  Administered 2018-10-11 – 2018-10-12 (×2): 100 mg via ORAL
  Filled 2018-10-11 (×6): qty 1

## 2018-10-11 MED ORDER — MAGNESIUM OXIDE 400 (241.3 MG) MG PO TABS
400.0000 mg | ORAL_TABLET | Freq: Every day | ORAL | Status: DC
  Administered 2018-10-12: 400 mg via ORAL
  Filled 2018-10-11: qty 1

## 2018-10-11 MED ORDER — POLYETHYLENE GLYCOL 3350 17 G PO PACK
17.0000 g | PACK | Freq: Every day | ORAL | Status: DC
  Filled 2018-10-11: qty 1

## 2018-10-11 MED ORDER — LORAZEPAM 1 MG PO TABS
1.0000 mg | ORAL_TABLET | Freq: Two times a day (BID) | ORAL | Status: DC
  Administered 2018-10-11 – 2018-10-12 (×3): 1 mg via ORAL
  Filled 2018-10-11 (×3): qty 1

## 2018-10-11 MED ORDER — CLONIDINE HCL 0.1 MG PO TABS
0.1000 mg | ORAL_TABLET | Freq: Two times a day (BID) | ORAL | Status: DC
  Administered 2018-10-11 – 2018-10-12 (×3): 0.1 mg via ORAL
  Filled 2018-10-11 (×3): qty 1

## 2018-10-11 MED ORDER — LEVOTHYROXINE SODIUM 50 MCG PO TABS
175.0000 ug | ORAL_TABLET | Freq: Every evening | ORAL | Status: DC
  Administered 2018-10-11: 175 ug via ORAL
  Filled 2018-10-11 (×2): qty 1

## 2018-10-11 MED ORDER — BENZONATATE 100 MG PO CAPS
100.0000 mg | ORAL_CAPSULE | Freq: Three times a day (TID) | ORAL | Status: DC | PRN
  Administered 2018-10-11: 100 mg via ORAL
  Filled 2018-10-11 (×3): qty 1

## 2018-10-11 MED ORDER — INSULIN ASPART 100 UNIT/ML ~~LOC~~ SOLN
0.0000 [IU] | Freq: Three times a day (TID) | SUBCUTANEOUS | Status: DC
  Administered 2018-10-11: 2 [IU] via SUBCUTANEOUS
  Administered 2018-10-11 – 2018-10-12 (×2): 3 [IU] via SUBCUTANEOUS
  Filled 2018-10-11 (×3): qty 1

## 2018-10-11 MED ORDER — ONDANSETRON HCL 4 MG PO TABS
4.0000 mg | ORAL_TABLET | Freq: Four times a day (QID) | ORAL | Status: DC | PRN
  Filled 2018-10-11: qty 1

## 2018-10-11 MED ORDER — ACETAMINOPHEN 650 MG RE SUPP
650.0000 mg | Freq: Four times a day (QID) | RECTAL | Status: DC | PRN
  Filled 2018-10-11: qty 1

## 2018-10-11 MED ORDER — AMIODARONE HCL 200 MG PO TABS
200.0000 mg | ORAL_TABLET | Freq: Every day | ORAL | Status: DC
  Administered 2018-10-11 – 2018-10-12 (×2): 200 mg via ORAL
  Filled 2018-10-11 (×2): qty 1

## 2018-10-11 MED ORDER — INSULIN DETEMIR 100 UNIT/ML ~~LOC~~ SOLN
10.0000 [IU] | Freq: Every day | SUBCUTANEOUS | Status: DC
  Administered 2018-10-11: 10 [IU] via SUBCUTANEOUS
  Filled 2018-10-11 (×3): qty 0.1

## 2018-10-11 MED ORDER — PRAVASTATIN SODIUM 20 MG PO TABS
20.0000 mg | ORAL_TABLET | Freq: Every day | ORAL | Status: DC
  Administered 2018-10-11: 20 mg via ORAL
  Filled 2018-10-11 (×2): qty 1

## 2018-10-11 MED ORDER — ONDANSETRON HCL 4 MG/2ML IJ SOLN
4.0000 mg | Freq: Four times a day (QID) | INTRAMUSCULAR | Status: DC | PRN
  Administered 2018-10-12: 4 mg via INTRAVENOUS

## 2018-10-11 MED ORDER — POTASSIUM CHLORIDE CRYS ER 20 MEQ PO TBCR: 40.0000 meq | EXTENDED_RELEASE_TABLET | ORAL | Status: DC

## 2018-10-11 MED ORDER — POTASSIUM CHLORIDE CRYS ER 20 MEQ PO TBCR
40.0000 meq | EXTENDED_RELEASE_TABLET | ORAL | Status: AC
  Administered 2018-10-11: 40 meq via ORAL
  Filled 2018-10-11 (×2): qty 2

## 2018-10-11 MED ORDER — INSULIN ASPART 100 UNIT/ML ~~LOC~~ SOLN: 0.0000 [IU] | Freq: Every day | SUBCUTANEOUS | Status: DC

## 2018-10-11 MED ORDER — SODIUM CHLORIDE 0.9 % IV SOLN
INTRAVENOUS | Status: DC
  Administered 2018-10-11 – 2018-10-12 (×2): via INTRAVENOUS

## 2018-10-11 NOTE — ED Notes (Signed)
ED TO INPATIENT HANDOFF REPORT  ED Nurse Name and Phone #: [phone]  S Name/Age/Gender Andrea Rivers 63 y.o. female Room/Bed: ED31A/ED31A  Code Status   Code Status: Full Code  Home/SNF/Other Home Patient oriented to: self, place, time and situation Is this baseline? Yes   Triage Complete: Triage complete  Chief Complaint Palpitations  Triage Note Pt brought in via ems from home with heart palpatitions.  Pt reports sob.  Pt has chest tightness when coughing  Iv in place  Pt alert.    Allergies Allergies  Allergen Reactions  . Other Other (See Comments)    ILOSONE- Caused GI distress also  . Latex Hives  . Exenatide Nausea Only and Other (See Comments)    Byetta- Nausea and abdominal pain, also  . Garlic Other (See Comments)    Severe acid reflux  . Onion Other (See Comments)    Severe acid reflux  . Rosiglitazone Other (See Comments)    Avandia- Affected heart  . Erythromycin Nausea And Vomiting    GI DISTRESS    Level of Care/Admitting Diagnosis ED Disposition    ED Disposition Condition [facility] Area: [place] [[id number]]  Level of Care: Telemetry [5]  Diagnosis: GI bleed [811914]  Admitting Physician: Lance Coon [[id number]]  Attending Physician: Lance Coon [[id number]]  Estimated length of stay: past midnight tomorrow  Certification:: I certify this patient will need inpatient services for at least 2 midnights  PT Class (Do Not Modify): Inpatient [101]  PT Acc Code (Do Not Modify): Private [1]       B Medical/Surgery History Past Medical History:  Diagnosis Date  . Anemia   . Anxiety   . CHF (congestive heart failure) ([place])   . Chronic kidney disease    INSUFFICIENCY  . Complication of anesthesia    had to be intubated during cataract surgery   " I could not breath "  . COPD (chronic obstructive pulmonary disease) ([place])   . Diabetes mellitus without complication ([place])   . Dyspnea    DOE  .  Dysrhythmia    A FIB  . Edema   . Fracture of distal femur ([place]) 11/2017   left   . GERD (gastroesophageal reflux disease)   . History of kidney stones   . Hypertension   . Hypothyroidism    ABLATION  . Iron deficiency anemia 03/31/2017  . Neuropathy   . Orthopnea   . Sleep apnea    CPAP  . Vertigo   . Wheezing    Past Surgical History:  Procedure Laterality Date  . CATARACT EXTRACTION W/PHACO Left 01/26/2017   Procedure: CATARACT EXTRACTION PHACO AND INTRAOCULAR LENS PLACEMENT (IOC);  Surgeon: Estill Cotta, MD;  Location: [facility] ORS;  Service: Ophthalmology;  Laterality: Left;  [place]   1:02.2 AP     23.7 CDE   28.92 fluid pack lot# [id number] H exp.05/08/2018  . CHOLECYSTECTOMY    . ORIF FEMUR FRACTURE Left 11/17/2017   Procedure: OPEN REDUCTION INTERNAL FIXATION (ORIF) DISTAL FEMUR FRACTURE;  Surgeon: Shona Needles, MD;  Location: [facility];  Service: Orthopedics;  Laterality: Left;  . TONSILLECTOMY       A IV Location/Drains/Wounds Patient Lines/Drains/Airways Status   Active Line/Drains/Airways    Name:   Placement date:   Placement time:   Site:   Days:   Peripheral IV 10/10/18 Right Hand   10/10/18    2329    Hand   1   Peripheral IV  10/11/18 Right Antecubital   10/11/18    0119    Antecubital   less than 1   Incision (Closed) 01/26/17 Eye Left   01/26/17    1002     623   Incision (Closed) 11/17/17 Leg Left   11/17/17    1130     328          Intake/Output Last 24 hours  Intake/Output Summary (Last 24 hours) at 10/11/2018 1908 Last data filed at 10/11/2018 1609 Gross per 24 hour  Intake 1221.91 ml  Output -  Net 1221.91 ml    Labs/Imaging Results for orders placed or performed during the hospital encounter of 10/10/18 (from the past 48 hour(s))  Basic metabolic panel     Status: Abnormal   Collection Time: 10/10/18  8:53 PM  Result Value Ref Range   Sodium 141 135 - 145 mmol/L   Potassium 3.3 (L) 3.5 - 5.1 mmol/L   Chloride 89 (L) 98 - 111 mmol/L   CO2 38 (H)  22 - 32 mmol/L   Glucose, Bld 218 (H) 70 - 99 mg/dL   BUN 54 (H) 8 - 23 mg/dL   Creatinine, Ser 2.26 (H) 0.44 - 1.00 mg/dL   Calcium 9.5 8.9 - 10.3 mg/dL   GFR calc non Af Amer 22 (L) >60 mL/min   GFR calc Af Amer 26 (L) >60 mL/min   Anion gap 14 5 - 15    Comment: Performed at [facility], [address] [place], [place] [address]  CBC     Status: Abnormal   Collection Time: 10/10/18  8:53 PM  Result Value Ref Range   WBC 6.8 4.0 - 10.5 K/uL   RBC 2.08 (L) 3.87 - 5.11 MIL/uL   Hemoglobin 5.5 (L) 12.0 - 15.0 g/dL   HCT 18.9 (L) 36.0 - 46.0 %   MCV 90.9 80.0 - 100.0 fL   MCH 26.4 26.0 - 34.0 pg   MCHC 29.1 (L) 30.0 - 36.0 g/dL   RDW 18.8 (H) 11.5 - 15.5 %   Platelets 117 (L) 150 - 400 K/uL    Comment: SPECIMEN CHECKED FOR CLOTS Immature Platelet Fraction may be clinically indicated, consider ordering this additional test [id number]    nRBC 0.0 0.0 - 0.2 %    Comment: Performed at [facility], [address] [place], [place] [address]  Troponin I - ONCE - STAT     Status: Abnormal   Collection Time: 10/10/18  8:53 PM  Result Value Ref Range   Troponin I 0.06 (HH) <0.03 ng/mL    Comment: CRITICAL RESULT CALLED TO, READ BACK BY AND VERIFIED WITH AMY COYNE AT 2238 10/10/2018 SMA Performed at [facility], [address] [place], [place] [address]   Prepare RBC     Status: None   Collection Time: 10/10/18 11:30 PM  Result Value Ref Range   Order Confirmation      ORDER PROCESSED BY BLOOD BANK Performed at [facility], [address] [place], [place] [address]   Type and screen [place]     Status: None (Preliminary result)   Collection Time: 10/10/18 11:33 PM  Result Value Ref Range   ABO/RH(D) O POS    Antibody Screen NEG    Sample Expiration 10/13/2018    Unit Number [id number]    Blood Component Type RED CELLS,LR    Unit division 00    Status of Unit ISSUED    Transfusion Status OK  TO TRANSFUSE     Crossmatch Result      Compatible Performed at [facility], [address] [place], [place] [address]    Unit Number [id number]    Blood Component Type RED CELLS,LR    Unit division 00    Status of Unit ALLOCATED    Transfusion Status OK TO TRANSFUSE    Crossmatch Result Compatible    Unit Number [id number]    Blood Component Type RBC LR PHER2    Unit division 00    Status of Unit ALLOCATED    Transfusion Status OK TO TRANSFUSE    Crossmatch Result Compatible   Basic metabolic panel     Status: Abnormal   Collection Time: 10/11/18 10:56 AM  Result Value Ref Range   Sodium 140 135 - 145 mmol/L   Potassium 2.8 (L) 3.5 - 5.1 mmol/L   Chloride 91 (L) 98 - 111 mmol/L   CO2 37 (H) 22 - 32 mmol/L   Glucose, Bld 162 (H) 70 - 99 mg/dL   BUN 50 (H) 8 - 23 mg/dL   Creatinine, Ser 1.99 (H) 0.44 - 1.00 mg/dL   Calcium 9.1 8.9 - 10.3 mg/dL   GFR calc non Af Amer 26 (L) >60 mL/min   GFR calc Af Amer 30 (L) >60 mL/min   Anion gap 12 5 - 15    Comment: Performed at [facility], [address] [place], [place] [address]  CBC     Status: Abnormal   Collection Time: 10/11/18 10:56 AM  Result Value Ref Range   WBC 11.4 (H) 4.0 - 10.5 K/uL   RBC 4.28 3.87 - 5.11 MIL/uL   Hemoglobin 11.3 (L) 12.0 - 15.0 g/dL    Comment: POST TRANSFUSION SAMPLE   HCT 37.0 36.0 - 46.0 %   MCV 86.4 80.0 - 100.0 fL   MCH 26.4 26.0 - 34.0 pg   MCHC 30.5 30.0 - 36.0 g/dL   RDW 18.6 (H) 11.5 - 15.5 %   Platelets 212 150 - 400 K/uL    Comment: Performed at [facility], [address] [place], [place] [address]  Protime-INR     Status: Abnormal   Collection Time: 10/11/18 10:56 AM  Result Value Ref Range   Prothrombin Time 18.4 (H) 11.4 - 15.2 seconds   INR 1.6 (H) 0.8 - 1.2    Comment: (NOTE) INR goal varies based on device and disease states. Performed at [facility], [address] [place], [place] [address]   Magnesium     Status: Abnormal    Collection Time: 10/11/18 10:56 AM  Result Value Ref Range   Magnesium 2.6 (H) 1.7 - 2.4 mg/dL    Comment: Performed at [facility], [address] [place], [place] [address]  Glucose, capillary     Status: Abnormal   Collection Time: 10/11/18 11:10 AM  Result Value Ref Range   Glucose-Capillary 141 (H) 70 - 99 mg/dL  Glucose, capillary     Status: Abnormal   Collection Time: 10/11/18  5:30 PM  Result Value Ref Range   Glucose-Capillary 171 (H) 70 - 99 mg/dL   Dg Chest 2 View  Result Date: 10/10/2018 CLINICAL DATA:  Shortness of breath. EXAM: CHEST - 2 VIEW COMPARISON:  Chest radiograph August 09, 2018 FINDINGS: Mild cardiomegaly. Mediastinal silhouette is not suspicious. No pleural effusion or focal consolidation. No pneumothorax. Mild thoracic spondylosis. Large body habitus. IMPRESSION: Mild cardiomegaly.  No acute pulmonary process. Electronically Signed  By: Elon Alas M.D.   On: 10/10/2018 21:20    Pending Labs Unresulted Labs (From admission, onward)    Start     Ordered   10/11/18 0838  HIV antibody (Routine Testing)  Once,   STAT     10/11/18 0837          Vitals/Pain Today's Vitals   10/11/18 0930 10/11/18 1002 10/11/18 1630 10/11/18 1630  BP: 113/87 (!) 149/68 140/76 140/76  Pulse: 78 60 63 65  Resp: 16 16 16 18   Temp:      TempSrc:      SpO2: 100% 95% 96% 97%  Weight:      Height:      PainSc:        Isolation Precautions No active isolations  Medications Medications  sodium chloride flush (NS) 0.9 % injection 3 mL (0 mLs Intravenous Hold 10/10/18 2240)  pantoprazole (PROTONIX) 80 mg in sodium chloride 0.9 % 250 mL (0.32 mg/mL) infusion (8 mg/hr Intravenous New Bag/Given 10/11/18 1627)  pantoprazole (PROTONIX) injection 40 mg (has no administration in time range)  pravastatin (PRAVACHOL) tablet 20 mg (has no administration in time range)  levothyroxine (SYNTHROID, LEVOTHROID) tablet 175 mcg (has no administration in time range)   acetaminophen (TYLENOL) tablet 650 mg (has no administration in time range)    Or  acetaminophen (TYLENOL) suppository 650 mg (has no administration in time range)  ondansetron (ZOFRAN) tablet 4 mg (has no administration in time range)    Or  ondansetron (ZOFRAN) injection 4 mg (has no administration in time range)  insulin aspart (novoLOG) injection 0-15 Units (3 Units Subcutaneous Given 10/11/18 1755)  insulin aspart (novoLOG) injection 0-5 Units (has no administration in time range)  insulin detemir (LEVEMIR) injection 10 Units (has no administration in time range)  benzonatate (TESSALON) capsule 100 mg (100 mg Oral Given 10/11/18 0655)  cloNIDine (CATAPRES) tablet 0.1 mg (0.1 mg Oral Given 10/11/18 1322)  polyethylene glycol (MIRALAX / GLYCOLAX) packet 17 g (has no administration in time range)  senna (SENOKOT) tablet 8.6 mg (has no administration in time range)  magnesium oxide (MAG-OX) tablet 400 mg (has no administration in time range)  amiodarone (PACERONE) tablet 200 mg (200 mg Oral Given 10/11/18 1323)  LORazepam (ATIVAN) tablet 1 mg (1 mg Oral Given 10/11/18 1322)  mometasone-formoterol (DULERA) 200-5 MCG/ACT inhaler 2 puff (has no administration in time range)  benzonatate (TESSALON) capsule 100 mg (100 mg Oral Not Given 10/11/18 1617)  potassium chloride SA (K-DUR,KLOR-CON) CR tablet 40 mEq (40 mEq Oral Given 10/11/18 1756)  0.9 %  sodium chloride infusion (0 mL/hr Intravenous Stopped 10/11/18 0531)  diphenhydrAMINE (BENADRYL) capsule 50 mg (50 mg Oral Given 10/10/18 2349)  pantoprazole (PROTONIX) 80 mg in sodium chloride 0.9 % 100 mL IVPB (0 mg Intravenous Stopped 10/11/18 0054)  acetaminophen (TYLENOL) tablet 500 mg (500 mg Oral Given 10/10/18 2349)    Mobility walks Moderate fall risk   Focused Assessments Cardiac Assessment Handoff:    Lab Results  Component Value Date   TROPONINI 0.06 ([place]) 10/10/2018   No results found for: DDIMER Does the Patient currently have chest pain? No      R Recommendations: See Admitting Provider Note  Report given to:   Additional Notes: Endoscopy is scheduled for tomorrow. NPO after midnight.

## 2018-10-11 NOTE — ED Notes (Signed)
Pt in NAD at this time, updated on room assignment and delay in calling report. Pt understanding and calm at this time.

## 2018-10-11 NOTE — ED Notes (Signed)
Pt assisted to bathroom using walker. Pt back to bed and placed back on cardiac monitor. Encouraged to keep using call light for needs.

## 2018-10-11 NOTE — ED Notes (Signed)
Pt has voluntarily removed [place]. Pt talking in complete sentences and denies SOB.

## 2018-10-11 NOTE — ED Notes (Addendum)
Pharmacy notified to send Pravachol and Synthroid doses.

## 2018-10-11 NOTE — Progress Notes (Signed)
[place] [place] at [place] NAME: Andrea Rivers    MR#:  [id number]  DATE OF BIRTH:  [birth year]  SUBJECTIVE:  patient came in with increasing shortness of breath and fell right fungi hemoglobin of 5.5. Denies any melena not extolled or vomiting. Denies any bright red blood per rectum or any hematuria.  Status post one unit blood transfusion. Awaiting bed.  REVIEW OF SYSTEMS:   Review of Systems  Constitutional: Negative for chills, fever and weight loss.  HENT: Negative for ear discharge, ear pain and nosebleeds.   Eyes: Negative for blurred vision, pain and discharge.  Respiratory: Negative for sputum production, shortness of breath, wheezing and stridor.   Cardiovascular: Negative for chest pain, palpitations, orthopnea and PND.  Gastrointestinal: Negative for abdominal pain, diarrhea, nausea and vomiting.  Genitourinary: Negative for frequency and urgency.  Musculoskeletal: Negative for back pain and joint pain.  Neurological: Positive for weakness. Negative for sensory change, speech change and focal weakness.  Psychiatric/Behavioral: Negative for depression and hallucinations. The patient is not nervous/anxious.    Tolerating Diet:yes Tolerating PT:  DRUG ALLERGIES:   Allergies  Allergen Reactions  . Other Other (See Comments)    ILOSONE- Caused GI distress also  . Latex Hives  . Exenatide Nausea Only and Other (See Comments)    Byetta- Nausea and abdominal pain, also  . Garlic Other (See Comments)    Severe acid reflux  . Onion Other (See Comments)    Severe acid reflux  . Rosiglitazone Other (See Comments)    Avandia- Affected heart  . Erythromycin Nausea And Vomiting    GI DISTRESS    VITALS:  Blood pressure (!) 149/68, pulse 60, temperature 98.1 F (36.7 C), temperature source Oral, resp. rate 16, height 5\' 5"  (1.651 m), weight (!) 148 kg, SpO2 95 %.  PHYSICAL EXAMINATION:   Physical Exam  GENERAL:  64  y.o.-year-old patient lying in the bed with no acute distress. obese EYES: Pupils equal, round, reactive to light and accommodation. No scleral icterus. Extraocular muscles intact. Pallor+ HEENT: Head atraumatic, normocephalic. Oropharynx and nasopharynx clear.  NECK:  Supple, no jugular venous distention. No thyroid enlargement, no tenderness.  LUNGS: Normal breath sounds bilaterally, no wheezing, rales, rhonchi. No use of accessory muscles of respiration.  CARDIOVASCULAR: S1, S2 normal. No murmurs, rubs, or gallops.  ABDOMEN: Soft, nontender, nondistended. Bowel sounds present. No organomegaly or mass.  EXTREMITIES: No cyanosis, clubbing or edema b/l.    NEUROLOGIC: Cranial nerves II through XII are intact. No focal Motor or sensory deficits b/l.   PSYCHIATRIC:  patient is alert and oriented x 3.  SKIN: No obvious rash, lesion, or ulcer.   LABORATORY PANEL:  CBC Recent Labs  Lab 10/11/18 1056  WBC 11.4*  HGB 11.3*  HCT 37.0  PLT 212    Chemistries  Recent Labs  Lab 10/11/18 1056  NA 140  K 2.8*  CL 91*  CO2 37*  GLUCOSE 162*  BUN 50*  CREATININE 1.99*  CALCIUM 9.1  MG 2.6*   Cardiac Enzymes Recent Labs  Lab 10/10/18 2053  TROPONINI 0.06*   RADIOLOGY:  Dg Chest 2 View  Result Date: 10/10/2018 CLINICAL DATA:  Shortness of breath. EXAM: CHEST - 2 VIEW COMPARISON:  Chest radiograph August 09, 2018 FINDINGS: Mild cardiomegaly. Mediastinal silhouette is not suspicious. No pleural effusion or focal consolidation. No pneumothorax. Mild thoracic spondylosis. Large body habitus. IMPRESSION: Mild cardiomegaly.  No acute pulmonary process. Electronically Signed  By: Elon Alas M.D.   On: 10/10/2018 21:20   ASSESSMENT AND PLAN:  Andrea Rivers  is a 63 y.o. female who presents with chief complaint as above.  Patient presents the ED with a complaint of chest discomfort and shortness of breath.  She has a history of CAD, but was also found in the ED to have a hemoglobin of  5.5.  She states that she has chronic A. fib and is on warfarin for the same.  She also states that she is not been taking her Protonix for the last 6 months.    * GI bleed - likley PUD in the setting of chronic warfarin -status post one unit PRBC transfusion ordered  -came in with hemoglobin of 5.5 --- 1unit blood transfusion hemoglobin 11.3  -on Protonix drip -await G.I. consultation.? EGD  *  Chest pain -I suspect this is demand ischemia given her significant anemia.  Transfusing blood tonight. -Denies any chest pain  -her cardiologist is Dr. Ubaldo Glassing  *  Chronic diastolic heart failure ([place]) -continue home meds  *  HTN (hypertension) -home dose antihypertensives   * Diabetes ([place]) -sliding scale insulin coverage  *  Atrial fibrillation (H[facility]) -hold warfarin for now, continue other meds  *  CKD (chronic kidney disease) -at baseline, avoid nephrotoxins   *  Hypothyroidism -home dose thyroid replacement   CODE STATUS: full  DVT Prophylaxis: SCD  TOTAL TIME TAKING CARE OF THIS PATIENT: *30* minutes.  >50% time spent on counselling and coordination of care  POSSIBLE D/C IN *1-2* DAYS, DEPENDING ON CLINICAL CONDITION.  Note: This dictation was prepared with Dragon dictation along with smaller phrase technology. Any transcriptional errors that result from this process are unintentional.  Fritzi Mandes M.D on 10/11/2018 at 2:37 PM  Between 7am to 6pm - Pager - [phone]  After 6pm go to [email or web address] - password [email or web address] [facility]  Office  [phone]  CC: Primary care physician; Sofie Hartigan, MDPatient ID: Andrea Rivers, female   DOB: [birth year], 63 y.o.   MRN: [id number]

## 2018-10-11 NOTE — H&P (View-Only) (Signed)
GI Inpatient Consult Note  Reason for Consult: GI Bleed   Attending Requesting Consult: Dr. Jannifer Franklin  History of Present Illness: Andrea Rivers is a 63 y.o. female seen for evaluation of GI bleed at the request of Dr. Lance Coon, MD. Pt presented to the ED last night for complaints of chest discomfort and shortness of breath. She has a PMH of chronic atrial fibrillation on warfarin. Hemoglobin in the ED was found to be 5.5. About two weeks ago her hemoglobin was 11. She has a hx of anemia and has been getting iron infusions as an outpatient. She saw Dr. Tasia Catchings 06/2018 where she received one dose of IV Venofer.  She was found to be guaiac positive in the ED.   Patient seen and examined this afternoon resting comfortably in ED bed. She denies any abdominal pain or cramping. She reports she hasn't noticed any bright red bloody or dark, tarry stools. She reports a hx of chronic constipation for which she takes a combination of laxatives and stool softeners daily. She reports formed BMs daily w/o any fecal urgency or incontinence. She reports some mild-moderate nausea yesterday w/o any emesis. She had stopped her Protonix 6 months ago on her own. She follows regularly with Dr. Ubaldo Glassing in [facility]. She has been on Warfarin for years. She denies any recent changes in diet or medications. No diarrhea or sick contacts. She has never had a colonoscopy. No known family hx of colon cancer or adenomatous polyps or IBD.    Last Colonoscopy:  Last Endoscopy:    Past Medical History:  Past Medical History:  Diagnosis Date  . Anemia   . Anxiety   . CHF (congestive heart failure) ([place])   . Chronic kidney disease    INSUFFICIENCY  . Complication of anesthesia    had to be intubated during cataract surgery   " I could not breath "  . COPD (chronic obstructive pulmonary disease) ([place])   . Diabetes mellitus without complication ([place])   . Dyspnea    DOE  . Dysrhythmia    A FIB  . Edema   . Fracture  of distal femur ([place]) 11/2017   left   . GERD (gastroesophageal reflux disease)   . History of kidney stones   . Hypertension   . Hypothyroidism    ABLATION  . Iron deficiency anemia 03/31/2017  . Neuropathy   . Orthopnea   . Sleep apnea    CPAP  . Vertigo   . Wheezing     Problem List: Patient Active Problem List   Diagnosis Date Noted  . GI bleed 10/10/2018  . Chest pain 10/10/2018  . Hypokalemia 06/08/2018  . Lymphedema 06/08/2018  . Closed displaced supracondylar fracture of distal end of left femur with intracondylar extension ([place]) 11/17/2017  . CKD (chronic kidney disease) 11/14/2017  . Hypothyroidism 11/14/2017  . Atrial fibrillation ([place]) 11/14/2017  . Anxiety state 11/14/2017  . Peripheral neuropathy 11/14/2017  . Fracture 11/14/2017  . Bradycardia 08/13/2017  . Microcytic anemia 03/31/2017  . Iron deficiency anemia 03/31/2017  . Chronic diastolic heart failure ([place]) 03/10/2017  . HTN (hypertension) 03/10/2017  . Diabetes ([place]) 03/10/2017  . Obstructive sleep apnea 03/10/2017  . Shock [facility])     Past Surgical History: Past Surgical History:  Procedure Laterality Date  . CATARACT EXTRACTION W/PHACO Left 01/26/2017   Procedure: CATARACT EXTRACTION PHACO AND INTRAOCULAR LENS PLACEMENT (IOC);  Surgeon: Estill Cotta, MD;  Location: [facility] ORS;  Service: Ophthalmology;  Laterality: Left;  [place]   1:02.2 AP     23.7 CDE   28.92 fluid pack lot# [id number] H exp.05/08/2018  . CHOLECYSTECTOMY    . ORIF FEMUR FRACTURE Left 11/17/2017   Procedure: OPEN REDUCTION INTERNAL FIXATION (ORIF) DISTAL FEMUR FRACTURE;  Surgeon: Shona Needles, MD;  Location: [place];  Service: Orthopedics;  Laterality: Left;  . TONSILLECTOMY      Allergies: Allergies  Allergen Reactions  . Other Other (See Comments)    ILOSONE- Caused GI distress also  . Latex Hives  . Exenatide Nausea Only and Other (See Comments)    Byetta- Nausea and abdominal pain, also  . Garlic Other (See Comments)     Severe acid reflux  . Onion Other (See Comments)    Severe acid reflux  . Rosiglitazone Other (See Comments)    Avandia- Affected heart  . Erythromycin Nausea And Vomiting    GI DISTRESS    Home Medications: (Not in a hospital admission)  Home medication reconciliation was completed with the patient.   Scheduled Inpatient Medications:   . amiodarone  200 mg Oral Daily  . cloNIDine  0.1 mg Oral BID  . insulin aspart  0-15 Units Subcutaneous TID WC  . insulin aspart  0-5 Units Subcutaneous QHS  . insulin detemir  10 Units Subcutaneous QHS  . levothyroxine  175 mcg Oral QPM  . LORazepam  1 mg Oral BID  . [START ON 10/12/2018] magnesium oxide  400 mg Oral Daily  . mometasone-formoterol  2 puff Inhalation BID  . [START ON 10/14/2018] pantoprazole  40 mg Intravenous Q12H  . [START ON 10/12/2018] polyethylene glycol  17 g Oral Daily  . pravastatin  20 mg Oral q1800  . senna  1 tablet Oral BID  . sodium chloride flush  3 mL Intravenous Once    Continuous Inpatient Infusions:   . pantoprozole (PROTONIX) infusion 8 mg/hr (10/11/18 0053)    PRN Inpatient Medications:  acetaminophen **OR** acetaminophen, benzonatate, ondansetron **OR** ondansetron (ZOFRAN) IV  Family History: family history includes Anemia in her mother and sister; COPD in her father and mother; Diabetes in her maternal grandmother; Heart disease in her father and mother; Hypertension in her paternal grandfather.  The patient's family history is negative for inflammatory bowel disorders, GI malignancy, or solid organ transplantation.  Social History:   reports that she quit smoking about 32 years ago. Her smoking use included cigarettes. She smoked 3.00 packs per day. She has never used smokeless tobacco. She reports that she does not drink alcohol or use drugs. The patient denies ETOH, tobacco, or drug use.   Review of Systems: Constitutional: Weight is stable.  Eyes: No changes in vision. ENT: No oral lesions, sore  throat.  GI: see HPI.  Heme/Lymph: No easy bruising.  CV: No chest pain.  GU: No hematuria.  Integumentary: No rashes.  Neuro: No headaches.  Psych: No depression/anxiety.  Endocrine: No heat/cold intolerance.  Allergic/Immunologic: No urticaria.  Resp: No cough, SOB.  Musculoskeletal: No joint swelling.    Physical Examination: BP (!) 149/68 (BP Location: Left Arm)   Pulse 60   Temp 98.1 F (36.7 C) (Oral)   Resp 16   Ht 5\' 5"  (1.651 m)   Wt (!) 148 kg   SpO2 95%   BMI 54.30 kg/m ] Obese female lying in ER stretcher, answers all questions appropriately, no accessory muscle use Gen: NAD, alert and oriented x 4 HEENT: PEERLA, EOMI, Neck: supple, no JVD or thyromegaly Chest: CTA bilaterally, no wheezes,  crackles, or other adventitious sounds CV: RRR, no m/g/c/r Abd: obese abdomen, nontender to palpation in all four quadrants,+BS in all four quadrants; no HSM, guarding, ridigity, or rebound tenderness Ext: no edema, well perfused with 2+ pulses, Skin: no rash or lesions noted Lymph: no LAD  Data: Lab Results  Component Value Date   WBC 11.4 (H) 10/11/2018   HGB 11.3 (L) 10/11/2018   HCT 37.0 10/11/2018   MCV 86.4 10/11/2018   PLT 212 10/11/2018   Recent Labs  Lab 10/10/18 2053 10/11/18 1056  HGB 5.5* 11.3*   Lab Results  Component Value Date   NA 140 10/11/2018   K 2.8 (L) 10/11/2018   CL 91 (L) 10/11/2018   CO2 37 (H) 10/11/2018   BUN 50 (H) 10/11/2018   CREATININE 1.99 (H) 10/11/2018   Lab Results  Component Value Date   ALT 17 05/09/2018   AST 30 05/09/2018   ALKPHOS 84 05/09/2018   BILITOT 0.8 05/09/2018   Recent Labs  Lab 10/11/18 1056  INR 1.6*   Assessment/Plan:  63 y/o Caucasian female with a PMH of COPD, CKD, diabetes mellitus, HTN, IDA, sleep apnea, chronic atrial fibrillation on Warfarin, diastolic CHF, and obesity presented to the ED  Yesterday for shortness of breath and chest discomfort  1. Lower GI bleed 2. Symptomatic anemia,  acute blood loss anemia  - Patient found to have hemoglobin 5.5 upon presentation to ED, 2 weeks ago hgb 11.1. Guaiac positive in the ED. - She is s/p 1 unit PRBCs. Repeat hgb this afternoon 11.3 - Differential includes peptic ulcer disease, gastritis, AVMs, diverticular, IBD, colon polyp, malignancy, s/t blood thinner - She is currently hemodynamically stable w/ no signs of active GI bleeding. Patient is colonoscopy naive and prefers to hold off on colonoscopy. She is agreeable to endoscopy. We discussed procedure details and indications. We discussed potential risks and complications, including bleeding, infection, small puncture to the intestines, or problems with anesthesia. She consents to proceed.  - Agree with IV Protonix - Continue to check serial H&H. Transfuse for hgb<7.0. - Plan on EGD tomorrow with Dr. Alice Reichert. - Heart healthy/carb modified diet tonight. NPO after 0500 tomorrow. - Hold warfarin - Advised colonoscopy but patient refuses at this time. Consider if EGD negative and  Hemoglobin continues to drop.    Thank you for the consult. Please call with questions or concerns.  Geanie Kenning, PA-C [place]

## 2018-10-11 NOTE — ED Notes (Signed)
RN attempted to call report without success.

## 2018-10-11 NOTE — ED Notes (Signed)
Pt provided dinner tray.

## 2018-10-11 NOTE — Consult Note (Addendum)
GI Inpatient Consult Note  Reason for Consult: GI Bleed   Attending Requesting Consult: Dr. Jannifer Franklin  History of Present Illness: Andrea Rivers is a 63 y.o. female seen for evaluation of GI bleed at the request of Dr. Lance Coon, MD. Pt presented to the ED last night for complaints of chest discomfort and shortness of breath. She has a PMH of chronic atrial fibrillation on warfarin. Hemoglobin in the ED was found to be 5.5. About two weeks ago her hemoglobin was 11. She has a hx of anemia and has been getting iron infusions as an outpatient. She saw Dr. Tasia Catchings 06/2018 where she received one dose of IV Venofer.  She was found to be guaiac positive in the ED.   Patient seen and examined this afternoon resting comfortably in ED bed. She denies any abdominal pain or cramping. She reports she hasn't noticed any bright red bloody or dark, tarry stools. She reports a hx of chronic constipation for which she takes a combination of laxatives and stool softeners daily. She reports formed BMs daily w/o any fecal urgency or incontinence. She reports some mild-moderate nausea yesterday w/o any emesis. She had stopped her Protonix 6 months ago on her own. She follows regularly with Dr. Ubaldo Glassing in [facility]. She has been on Warfarin for years. She denies any recent changes in diet or medications. No diarrhea or sick contacts. She has never had a colonoscopy. No known family hx of colon cancer or adenomatous polyps or IBD.    Last Colonoscopy:  Last Endoscopy:    Past Medical History:  Past Medical History:  Diagnosis Date  . Anemia   . Anxiety   . CHF (congestive heart failure) ([place])   . Chronic kidney disease    INSUFFICIENCY  . Complication of anesthesia    had to be intubated during cataract surgery   " I could not breath "  . COPD (chronic obstructive pulmonary disease) ([place])   . Diabetes mellitus without complication ([place])   . Dyspnea    DOE  . Dysrhythmia    A FIB  . Edema   . Fracture  of distal femur ([place]) 11/2017   left   . GERD (gastroesophageal reflux disease)   . History of kidney stones   . Hypertension   . Hypothyroidism    ABLATION  . Iron deficiency anemia 03/31/2017  . Neuropathy   . Orthopnea   . Sleep apnea    CPAP  . Vertigo   . Wheezing     Problem List: Patient Active Problem List   Diagnosis Date Noted  . GI bleed 10/10/2018  . Chest pain 10/10/2018  . Hypokalemia 06/08/2018  . Lymphedema 06/08/2018  . Closed displaced supracondylar fracture of distal end of left femur with intracondylar extension ([place]) 11/17/2017  . CKD (chronic kidney disease) 11/14/2017  . Hypothyroidism 11/14/2017  . Atrial fibrillation ([place]) 11/14/2017  . Anxiety state 11/14/2017  . Peripheral neuropathy 11/14/2017  . Fracture 11/14/2017  . Bradycardia 08/13/2017  . Microcytic anemia 03/31/2017  . Iron deficiency anemia 03/31/2017  . Chronic diastolic heart failure ([place]) 03/10/2017  . HTN (hypertension) 03/10/2017  . Diabetes ([place]) 03/10/2017  . Obstructive sleep apnea 03/10/2017  . Shock [facility])     Past Surgical History: Past Surgical History:  Procedure Laterality Date  . CATARACT EXTRACTION W/PHACO Left 01/26/2017   Procedure: CATARACT EXTRACTION PHACO AND INTRAOCULAR LENS PLACEMENT (IOC);  Surgeon: Estill Cotta, MD;  Location: [facility] ORS;  Service: Ophthalmology;  Laterality: Left;  [place]   1:02.2 AP     23.7 CDE   28.92 fluid pack lot# [id number] H exp.05/08/2018  . CHOLECYSTECTOMY    . ORIF FEMUR FRACTURE Left 11/17/2017   Procedure: OPEN REDUCTION INTERNAL FIXATION (ORIF) DISTAL FEMUR FRACTURE;  Surgeon: Shona Needles, MD;  Location: [place];  Service: Orthopedics;  Laterality: Left;  . TONSILLECTOMY      Allergies: Allergies  Allergen Reactions  . Other Other (See Comments)    ILOSONE- Caused GI distress also  . Latex Hives  . Exenatide Nausea Only and Other (See Comments)    Byetta- Nausea and abdominal pain, also  . Garlic Other (See Comments)     Severe acid reflux  . Onion Other (See Comments)    Severe acid reflux  . Rosiglitazone Other (See Comments)    Avandia- Affected heart  . Erythromycin Nausea And Vomiting    GI DISTRESS    Home Medications: (Not in a hospital admission)  Home medication reconciliation was completed with the patient.   Scheduled Inpatient Medications:   . amiodarone  200 mg Oral Daily  . cloNIDine  0.1 mg Oral BID  . insulin aspart  0-15 Units Subcutaneous TID WC  . insulin aspart  0-5 Units Subcutaneous QHS  . insulin detemir  10 Units Subcutaneous QHS  . levothyroxine  175 mcg Oral QPM  . LORazepam  1 mg Oral BID  . [START ON 10/12/2018] magnesium oxide  400 mg Oral Daily  . mometasone-formoterol  2 puff Inhalation BID  . [START ON 10/14/2018] pantoprazole  40 mg Intravenous Q12H  . [START ON 10/12/2018] polyethylene glycol  17 g Oral Daily  . pravastatin  20 mg Oral q1800  . senna  1 tablet Oral BID  . sodium chloride flush  3 mL Intravenous Once    Continuous Inpatient Infusions:   . pantoprozole (PROTONIX) infusion 8 mg/hr (10/11/18 0053)    PRN Inpatient Medications:  acetaminophen **OR** acetaminophen, benzonatate, ondansetron **OR** ondansetron (ZOFRAN) IV  Family History: family history includes Anemia in her mother and sister; COPD in her father and mother; Diabetes in her maternal grandmother; Heart disease in her father and mother; Hypertension in her paternal grandfather.  The patient's family history is negative for inflammatory bowel disorders, GI malignancy, or solid organ transplantation.  Social History:   reports that she quit smoking about 32 years ago. Her smoking use included cigarettes. She smoked 3.00 packs per day. She has never used smokeless tobacco. She reports that she does not drink alcohol or use drugs. The patient denies ETOH, tobacco, or drug use.   Review of Systems: Constitutional: Weight is stable.  Eyes: No changes in vision. ENT: No oral lesions, sore  throat.  GI: see HPI.  Heme/Lymph: No easy bruising.  CV: No chest pain.  GU: No hematuria.  Integumentary: No rashes.  Neuro: No headaches.  Psych: No depression/anxiety.  Endocrine: No heat/cold intolerance.  Allergic/Immunologic: No urticaria.  Resp: No cough, SOB.  Musculoskeletal: No joint swelling.    Physical Examination: BP (!) 149/68 (BP Location: Left Arm)   Pulse 60   Temp 98.1 F (36.7 C) (Oral)   Resp 16   Ht 5\' 5"  (1.651 m)   Wt (!) 148 kg   SpO2 95%   BMI 54.30 kg/m ] Obese female lying in ER stretcher, answers all questions appropriately, no accessory muscle use Gen: NAD, alert and oriented x 4 HEENT: PEERLA, EOMI, Neck: supple, no JVD or thyromegaly Chest: CTA bilaterally, no wheezes,  crackles, or other adventitious sounds CV: RRR, no m/g/c/r Abd: obese abdomen, nontender to palpation in all four quadrants,+BS in all four quadrants; no HSM, guarding, ridigity, or rebound tenderness Ext: no edema, well perfused with 2+ pulses, Skin: no rash or lesions noted Lymph: no LAD  Data: Lab Results  Component Value Date   WBC 11.4 (H) 10/11/2018   HGB 11.3 (L) 10/11/2018   HCT 37.0 10/11/2018   MCV 86.4 10/11/2018   PLT 212 10/11/2018   Recent Labs  Lab 10/10/18 2053 10/11/18 1056  HGB 5.5* 11.3*   Lab Results  Component Value Date   NA 140 10/11/2018   K 2.8 (L) 10/11/2018   CL 91 (L) 10/11/2018   CO2 37 (H) 10/11/2018   BUN 50 (H) 10/11/2018   CREATININE 1.99 (H) 10/11/2018   Lab Results  Component Value Date   ALT 17 05/09/2018   AST 30 05/09/2018   ALKPHOS 84 05/09/2018   BILITOT 0.8 05/09/2018   Recent Labs  Lab 10/11/18 1056  INR 1.6*   Assessment/Plan:  64 y/o Caucasian female with a PMH of COPD, CKD, diabetes mellitus, HTN, IDA, sleep apnea, chronic atrial fibrillation on Warfarin, diastolic CHF, and obesity presented to the ED  Yesterday for shortness of breath and chest discomfort  1. Lower GI bleed 2. Symptomatic anemia,  acute blood loss anemia  - Patient found to have hemoglobin 5.5 upon presentation to ED, 2 weeks ago hgb 11.1. Guaiac positive in the ED. - She is s/p 1 unit PRBCs. Repeat hgb this afternoon 11.3 - Differential includes peptic ulcer disease, gastritis, AVMs, diverticular, IBD, colon polyp, malignancy, s/t blood thinner - She is currently hemodynamically stable w/ no signs of active GI bleeding. Patient is colonoscopy naive and prefers to hold off on colonoscopy. She is agreeable to endoscopy. We discussed procedure details and indications. We discussed potential risks and complications, including bleeding, infection, small puncture to the intestines, or problems with anesthesia. She consents to proceed.  - Agree with IV Protonix - Continue to check serial H&H. Transfuse for hgb<7.0. - Plan on EGD tomorrow with Dr. Alice Reichert. - Heart healthy/carb modified diet tonight. NPO after 0500 tomorrow. - Hold warfarin - Advised colonoscopy but patient refuses at this time. Consider if EGD negative and  Hemoglobin continues to drop.    Thank you for the consult. Please call with questions or concerns.  Geanie Kenning, PA-C [place]

## 2018-10-11 NOTE — Consult Note (Signed)
Reason for Consult: Atrial fibrillation GI bleed  Referring Physician: Chryl Heck hospitalist Dr. Dr.  Cardiologist Dr. Vanice [name] is an 63 y.o. female.  HPI: Patient is a 63 year old obese female presented with chest discomfort shortness of breath.  Patient had history of coronary disease found to have hemoglobin of 5.5 thought to be GI bleeding patient is currently in anticoagulation with Coumadin because of chronic A. fib.  Patient was on Protonix but stopped taking it because she was worried about renal insufficiency over the past 6 months she gets shots for anemia as well she really has not noticed any significant melena or bright red blood but she was heme positive in emergency room.  Patient feels reasonably well no worsening symptoms today  Past Medical History:  Diagnosis Date  . Anemia   . Anxiety   . CHF (congestive heart failure) ([place])   . Chronic kidney disease    INSUFFICIENCY  . Complication of anesthesia    had to be intubated during cataract surgery   " I could not breath "  . COPD (chronic obstructive pulmonary disease) ([place])   . Diabetes mellitus without complication (Andrea Rivers)   . Dyspnea    DOE  . Dysrhythmia    A FIB  . Edema   . Fracture of distal femur ([place]) 11/2017   left   . GERD (gastroesophageal reflux disease)   . History of kidney stones   . Hypertension   . Hypothyroidism    ABLATION  . Iron deficiency anemia 03/31/2017  . Neuropathy   . Orthopnea   . Sleep apnea    CPAP  . Vertigo   . Wheezing     Past Surgical History:  Procedure Laterality Date  . CATARACT EXTRACTION W/PHACO Left 01/26/2017   Procedure: CATARACT EXTRACTION PHACO AND INTRAOCULAR LENS PLACEMENT (IOC);  Surgeon: Estill Cotta, MD;  Location: [facility];  Service: Ophthalmology;  Laterality: Left;  [place]   1:02.2 AP     23.7 CDE   28.92 fluid pack lot# [id number] H exp.05/08/2018  . CHOLECYSTECTOMY    . ORIF FEMUR FRACTURE Left 11/17/2017   Procedure: OPEN REDUCTION  INTERNAL FIXATION (ORIF) DISTAL FEMUR FRACTURE;  Surgeon: Shona Needles, MD;  Location: [facility];  Service: Orthopedics;  Laterality: Left;  . TONSILLECTOMY      Family History  Problem Relation Age of Onset  . COPD Mother   . Heart disease Mother   . Anemia Mother   . Heart disease Father   . COPD Father   . Anemia Sister   . Diabetes Maternal Grandmother   . Hypertension Paternal Grandfather     Social History:  reports that she quit smoking about 32 years ago. Her smoking use included cigarettes. She smoked 3.00 packs per day. She has never used smokeless tobacco. She reports that she does not drink alcohol or use drugs.  Allergies:  Allergies  Allergen Reactions  . Other Other (See Comments)    ILOSONE- Caused GI distress also  . Latex Hives  . Exenatide Nausea Only and Other (See Comments)    Byetta- Nausea and abdominal pain, also  . Garlic Other (See Comments)    Severe acid reflux  . Onion Other (See Comments)    Severe acid reflux  . Rosiglitazone Other (See Comments)    Avandia- Affected heart  . Erythromycin Nausea And Vomiting    GI DISTRESS    Medications: I have reviewed the patient's current medications.  Results for orders placed  or performed during the hospital encounter of 10/10/18 (from the past 48 hour(s))  Basic metabolic panel     Status: Abnormal   Collection Time: 10/10/18  8:53 PM  Result Value Ref Range   Sodium 141 135 - 145 mmol/L   Potassium 3.3 (L) 3.5 - 5.1 mmol/L   Chloride 89 (L) 98 - 111 mmol/L   CO2 38 (H) 22 - 32 mmol/L   Glucose, Bld 218 (H) 70 - 99 mg/dL   BUN 54 (H) 8 - 23 mg/dL   Creatinine, Ser 2.26 (H) 0.44 - 1.00 mg/dL   Calcium 9.5 8.9 - 10.3 mg/dL   GFR calc non Af Amer 22 (L) >60 mL/min   GFR calc Af Amer 26 (L) >60 mL/min   Anion gap 14 5 - 15    Comment: Performed at [facility], [address] [place], [place] [address]  CBC     Status: Abnormal   Collection Time: 10/10/18  8:53 PM  Result Value Ref  Range   WBC 6.8 4.0 - 10.5 K/uL   RBC 2.08 (L) 3.87 - 5.11 MIL/uL   Hemoglobin 5.5 (L) 12.0 - 15.0 g/dL   HCT 18.9 (L) 36.0 - 46.0 %   MCV 90.9 80.0 - 100.0 fL   MCH 26.4 26.0 - 34.0 pg   MCHC 29.1 (L) 30.0 - 36.0 g/dL   RDW 18.8 (H) 11.5 - 15.5 %   Platelets 117 (L) 150 - 400 K/uL    Comment: SPECIMEN CHECKED FOR CLOTS Immature Platelet Fraction may be clinically indicated, consider ordering this additional test [id number]    nRBC 0.0 0.0 - 0.2 %    Comment: Performed at [facility], [address] [place], [place] [address]  Troponin I - ONCE - STAT     Status: Abnormal   Collection Time: 10/10/18  8:53 PM  Result Value Ref Range   Troponin I 0.06 (HH) <0.03 ng/mL    Comment: CRITICAL RESULT CALLED TO, READ BACK BY AND VERIFIED WITH AMY COYNE AT 2238 10/10/2018 SMA Performed at [facility], [address] [place], [place] [address]   Prepare RBC     Status: None   Collection Time: 10/10/18 11:30 PM  Result Value Ref Range   Order Confirmation      ORDER PROCESSED BY BLOOD BANK Performed at [facility], [address] [place], [place] [address]   Type and screen [place]     Status: None (Preliminary result)   Collection Time: 10/10/18 11:33 PM  Result Value Ref Range   ABO/RH(D) O POS    Antibody Screen NEG    Sample Expiration 10/13/2018    Unit Number [id number]    Blood Component Type RED CELLS,LR    Unit division 00    Status of Unit ISSUED    Transfusion Status OK TO TRANSFUSE    Crossmatch Result      Compatible Performed at [facility], [address] [place], [place] [address]    Unit Number [id number]    Blood Component Type RED CELLS,LR    Unit division 00    Status of Unit ALLOCATED    Transfusion Status OK TO TRANSFUSE    Crossmatch Result Compatible    Unit Number [id number]    Blood Component Type RBC LR PHER2    Unit division 00    Status of Unit ALLOCATED     Transfusion Status OK TO TRANSFUSE    Crossmatch Result  Compatible   Basic metabolic panel     Status: Abnormal   Collection Time: 10/11/18 10:56 AM  Result Value Ref Range   Sodium 140 135 - 145 mmol/L   Potassium 2.8 (L) 3.5 - 5.1 mmol/L   Chloride 91 (L) 98 - 111 mmol/L   CO2 37 (H) 22 - 32 mmol/L   Glucose, Bld 162 (H) 70 - 99 mg/dL   BUN 50 (H) 8 - 23 mg/dL   Creatinine, Ser 1.99 (H) 0.44 - 1.00 mg/dL   Calcium 9.1 8.9 - 10.3 mg/dL   GFR calc non Af Amer 26 (L) >60 mL/min   GFR calc Af Amer 30 (L) >60 mL/min   Anion gap 12 5 - 15    Comment: Performed at [facility], [address] [place], [place] [address]  CBC     Status: Abnormal   Collection Time: 10/11/18 10:56 AM  Result Value Ref Range   WBC 11.4 (H) 4.0 - 10.5 K/uL   RBC 4.28 3.87 - 5.11 MIL/uL   Hemoglobin 11.3 (L) 12.0 - 15.0 g/dL    Comment: POST TRANSFUSION SAMPLE   HCT 37.0 36.0 - 46.0 %   MCV 86.4 80.0 - 100.0 fL   MCH 26.4 26.0 - 34.0 pg   MCHC 30.5 30.0 - 36.0 g/dL   RDW 18.6 (H) 11.5 - 15.5 %   Platelets 212 150 - 400 K/uL    Comment: Performed at [facility], [address] [place], [place] [address]  Protime-INR     Status: Abnormal   Collection Time: 10/11/18 10:56 AM  Result Value Ref Range   Prothrombin Time 18.4 (H) 11.4 - 15.2 seconds   INR 1.6 (H) 0.8 - 1.2    Comment: (NOTE) INR goal varies based on device and disease states. Performed at [facility], [address] [place], [place] [address]   Magnesium     Status: Abnormal   Collection Time: 10/11/18 10:56 AM  Result Value Ref Range   Magnesium 2.6 (H) 1.7 - 2.4 mg/dL    Comment: Performed at [facility], [address] [place], [place] [address]  Glucose, capillary     Status: Abnormal   Collection Time: 10/11/18 11:10 AM  Result Value Ref Range   Glucose-Capillary 141 (H) 70 - 99 mg/dL    Dg Chest 2 View  Result Date: 10/10/2018 CLINICAL DATA:  Shortness of breath. EXAM: CHEST - 2  VIEW COMPARISON:  Chest radiograph August 09, 2018 FINDINGS: Mild cardiomegaly. Mediastinal silhouette is not suspicious. No pleural effusion or focal consolidation. No pneumothorax. Mild thoracic spondylosis. Large body habitus. IMPRESSION: Mild cardiomegaly.  No acute pulmonary process. Electronically Signed   By: Elon Alas M.D.   On: 10/10/2018 21:20    Review of Systems  Constitutional: Positive for diaphoresis and malaise/fatigue.  HENT: Positive for congestion.   Eyes: Negative.   Respiratory: Positive for shortness of breath.   Cardiovascular: Positive for palpitations, orthopnea, leg swelling and PND.  Gastrointestinal: Negative.   Genitourinary: Negative.   Musculoskeletal: Positive for myalgias.  Skin: Negative.   Neurological: Negative.   Endo/Heme/Allergies: Negative.   Psychiatric/Behavioral: Negative.    Blood pressure (!) 149/68, pulse 60, temperature 98.1 F (36.7 C), temperature source Oral, resp. rate 16, height 5\' 5"  (1.651 m), weight (!) 148 kg, SpO2 95 %. Physical Exam  Nursing note and vitals reviewed. Constitutional: She is oriented to person, place, and time. She appears well-developed and well-nourished.  HENT:  Head:  Normocephalic and atraumatic.  Eyes: Pupils are equal, round, and reactive to light. Conjunctivae and EOM are normal.  Neck: Normal range of motion. Neck supple.  Cardiovascular: Normal rate, regular rhythm and normal heart sounds.  Respiratory: Effort normal and breath sounds normal.  GI: Soft. Bowel sounds are normal.  Genitourinary: Rectum:     Guaiac result positive.   Musculoskeletal: Normal range of motion.        General: Edema present.  Neurological: She is alert and oriented to person, place, and time. She has normal reflexes.  Skin: Skin is warm and dry.  Psychiatric: She has a normal mood and affect.    Assessment/Plan: Atrial fibrillation GI bleeding Obstructive sleep apnea Obesity Anemia Congestive heart  failure COPD Chronic renal sufficiency . Agree with admit follow-up H&H Transfuse as necessary Supplemental oxygen as patient short of breath Low-dose diuretic therapy for dyspnea and edema Consult GI for possible EGD and colonoscopy Hold Coumadin for now until bleeding is assessed  Dwayne D Callwood 10/11/2018, 3:34 PM

## 2018-10-12 ENCOUNTER — Encounter: Payer: Self-pay | Admitting: Anesthesiology

## 2018-10-12 ENCOUNTER — Encounter
Admission: EM | Disposition: A | Discharge status: Home / Self Care | Payer: Self-pay | Source: Home / Self Care | Attending: Internal Medicine

## 2018-10-12 ENCOUNTER — Other Ambulatory Visit: Payer: Self-pay

## 2018-10-12 ENCOUNTER — Inpatient Hospital Stay: Payer: Medicare Other | Admitting: Anesthesiology

## 2018-10-12 ENCOUNTER — Emergency Department: Payer: Medicare Other

## 2018-10-12 ENCOUNTER — Encounter: Payer: Self-pay | Admitting: Emergency Medicine

## 2018-10-12 ENCOUNTER — Inpatient Hospital Stay
Admission: EM | Admit: 2018-10-12 | Discharge: 2018-10-16 | DRG: 291 | Disposition: A | Discharge status: Home / Self Care | Payer: Medicare Other | Attending: Specialist | Admitting: Specialist

## 2018-10-12 DIAGNOSIS — Z881 Allergy status to other antibiotic agents status: Secondary | ICD-10-CM

## 2018-10-12 DIAGNOSIS — Z91040 Latex allergy status: Secondary | ICD-10-CM

## 2018-10-12 DIAGNOSIS — J9602 Acute respiratory failure with hypercapnia: Secondary | ICD-10-CM

## 2018-10-12 DIAGNOSIS — D62 Acute posthemorrhagic anemia: Secondary | ICD-10-CM | POA: Diagnosis present

## 2018-10-12 DIAGNOSIS — E1140 Type 2 diabetes mellitus with diabetic neuropathy, unspecified: Secondary | ICD-10-CM | POA: Diagnosis present

## 2018-10-12 DIAGNOSIS — R7989 Other specified abnormal findings of blood chemistry: Secondary | ICD-10-CM

## 2018-10-12 DIAGNOSIS — Z8249 Family history of ischemic heart disease and other diseases of the circulatory system: Secondary | ICD-10-CM

## 2018-10-12 DIAGNOSIS — R778 Other specified abnormalities of plasma proteins: Secondary | ICD-10-CM

## 2018-10-12 DIAGNOSIS — Z91018 Allergy to other foods: Secondary | ICD-10-CM

## 2018-10-12 DIAGNOSIS — E119 Type 2 diabetes mellitus without complications: Secondary | ICD-10-CM

## 2018-10-12 DIAGNOSIS — J9601 Acute respiratory failure with hypoxia: Secondary | ICD-10-CM

## 2018-10-12 DIAGNOSIS — Z888 Allergy status to other drugs, medicaments and biological substances status: Secondary | ICD-10-CM

## 2018-10-12 DIAGNOSIS — I5033 Acute on chronic diastolic (congestive) heart failure: Secondary | ICD-10-CM

## 2018-10-12 DIAGNOSIS — I10 Essential (primary) hypertension: Secondary | ICD-10-CM | POA: Diagnosis present

## 2018-10-12 DIAGNOSIS — I2489 Other forms of acute ischemic heart disease: Secondary | ICD-10-CM

## 2018-10-12 DIAGNOSIS — I248 Other forms of acute ischemic heart disease: Secondary | ICD-10-CM | POA: Diagnosis not present

## 2018-10-12 DIAGNOSIS — I4891 Unspecified atrial fibrillation: Secondary | ICD-10-CM | POA: Diagnosis present

## 2018-10-12 DIAGNOSIS — F419 Anxiety disorder, unspecified: Secondary | ICD-10-CM | POA: Diagnosis present

## 2018-10-12 DIAGNOSIS — Z825 Family history of asthma and other chronic lower respiratory diseases: Secondary | ICD-10-CM

## 2018-10-12 DIAGNOSIS — I509 Heart failure, unspecified: Secondary | ICD-10-CM

## 2018-10-12 DIAGNOSIS — F329 Major depressive disorder, single episode, unspecified: Secondary | ICD-10-CM | POA: Diagnosis present

## 2018-10-12 DIAGNOSIS — I130 Hypertensive heart and chronic kidney disease with heart failure and stage 1 through stage 4 chronic kidney disease, or unspecified chronic kidney disease: Principal | ICD-10-CM | POA: Diagnosis present

## 2018-10-12 DIAGNOSIS — J449 Chronic obstructive pulmonary disease, unspecified: Secondary | ICD-10-CM | POA: Diagnosis present

## 2018-10-12 DIAGNOSIS — G4733 Obstructive sleep apnea (adult) (pediatric): Secondary | ICD-10-CM | POA: Diagnosis present

## 2018-10-12 DIAGNOSIS — N183 Chronic kidney disease, stage 3 (moderate): Secondary | ICD-10-CM | POA: Diagnosis present

## 2018-10-12 DIAGNOSIS — E1122 Type 2 diabetes mellitus with diabetic chronic kidney disease: Secondary | ICD-10-CM | POA: Diagnosis present

## 2018-10-12 DIAGNOSIS — Z79890 Hormone replacement therapy (postmenopausal): Secondary | ICD-10-CM

## 2018-10-12 DIAGNOSIS — J9621 Acute and chronic respiratory failure with hypoxia: Secondary | ICD-10-CM | POA: Diagnosis present

## 2018-10-12 DIAGNOSIS — Z833 Family history of diabetes mellitus: Secondary | ICD-10-CM

## 2018-10-12 DIAGNOSIS — R079 Chest pain, unspecified: Secondary | ICD-10-CM

## 2018-10-12 DIAGNOSIS — Z7901 Long term (current) use of anticoagulants: Secondary | ICD-10-CM

## 2018-10-12 DIAGNOSIS — K219 Gastro-esophageal reflux disease without esophagitis: Secondary | ICD-10-CM | POA: Diagnosis present

## 2018-10-12 DIAGNOSIS — Z87891 Personal history of nicotine dependence: Secondary | ICD-10-CM

## 2018-10-12 DIAGNOSIS — J9622 Acute and chronic respiratory failure with hypercapnia: Secondary | ICD-10-CM | POA: Diagnosis present

## 2018-10-12 DIAGNOSIS — E039 Hypothyroidism, unspecified: Secondary | ICD-10-CM | POA: Diagnosis present

## 2018-10-12 DIAGNOSIS — Z794 Long term (current) use of insulin: Secondary | ICD-10-CM

## 2018-10-12 DIAGNOSIS — Z7951 Long term (current) use of inhaled steroids: Secondary | ICD-10-CM

## 2018-10-12 HISTORY — PX: ESOPHAGOGASTRODUODENOSCOPY: SHX5428

## 2018-10-12 LAB — TYPE AND SCREEN
ABO/RH(D): O POS
Antibody Screen: NEGATIVE
Unit division: 0
Unit division: 0
Unit division: 0

## 2018-10-12 LAB — BLOOD GAS, ARTERIAL
Acid-Base Excess: 10.5 mmol/L — ABNORMAL HIGH (ref 0.0–2.0)
BICARBONATE: 37.8 mmol/L — AB (ref 20.0–28.0)
FIO2: 0.28
O2 Saturation: 93.5 %
Patient temperature: 37
pCO2 arterial: 61 mmHg — ABNORMAL HIGH (ref 32.0–48.0)
pH, Arterial: 7.4 (ref 7.350–7.450)
pO2, Arterial: 69 mmHg — ABNORMAL LOW (ref 83.0–108.0)

## 2018-10-12 LAB — BPAM RBC
Blood Product Expiration Date: 202003262359
Blood Product Expiration Date: 202003282359
Blood Product Expiration Date: 202003292359
ISSUE DATE / TIME: 202003040105
Unit Type and Rh: 5100
Unit Type and Rh: 5100
Unit Type and Rh: 5100

## 2018-10-12 LAB — HIV ANTIBODY (ROUTINE TESTING W REFLEX): HIV Screen 4th Generation wRfx: NONREACTIVE

## 2018-10-12 LAB — GLUCOSE, CAPILLARY: Glucose-Capillary: 168 mg/dL — ABNORMAL HIGH (ref 70–99)

## 2018-10-12 LAB — POTASSIUM: Potassium: 3.2 mmol/L — ABNORMAL LOW (ref 3.5–5.1)

## 2018-10-12 LAB — PREPARE RBC (CROSSMATCH)

## 2018-10-12 LAB — BRAIN NATRIURETIC PEPTIDE: B Natriuretic Peptide: 312 pg/mL — ABNORMAL HIGH (ref 0.0–100.0)

## 2018-10-12 SURGERY — EGD (ESOPHAGOGASTRODUODENOSCOPY)
Anesthesia: General

## 2018-10-12 MED ORDER — GABAPENTIN 300 MG PO CAPS: 300.0000 mg | ORAL_CAPSULE | Freq: Three times a day (TID) | ORAL | Status: DC | PRN

## 2018-10-12 MED ORDER — IPRATROPIUM-ALBUTEROL 0.5-2.5 (3) MG/3ML IN SOLN
RESPIRATORY_TRACT | Status: AC
  Administered 2018-10-12: 14:00:00
  Filled 2018-10-12: qty 3

## 2018-10-12 MED ORDER — GABAPENTIN 100 MG PO CAPS
100.0000 mg | ORAL_CAPSULE | Freq: Three times a day (TID) | ORAL | Status: DC
  Administered 2018-10-13 – 2018-10-16 (×11): 100 mg via ORAL
  Filled 2018-10-12 (×13): qty 1

## 2018-10-12 MED ORDER — IPRATROPIUM-ALBUTEROL 0.5-2.5 (3) MG/3ML IN SOLN
3.0000 mL | Freq: Four times a day (QID) | RESPIRATORY_TRACT | Status: DC
  Filled 2018-10-12 (×2): qty 3

## 2018-10-12 MED ORDER — PROPOFOL 10 MG/ML IV BOLUS
INTRAVENOUS | Status: DC | PRN
  Administered 2018-10-12: 40 mg via INTRAVENOUS
  Administered 2018-10-12 (×2): 30 mg via INTRAVENOUS

## 2018-10-12 MED ORDER — PROPOFOL 500 MG/50ML IV EMUL
INTRAVENOUS | Status: DC | PRN
  Administered 2018-10-12: 125 ug/kg/min via INTRAVENOUS

## 2018-10-12 MED ORDER — PANTOPRAZOLE SODIUM 40 MG PO TBEC: 40.0000 mg | DELAYED_RELEASE_TABLET | Freq: Two times a day (BID) | ORAL | 1 refills | Status: AC

## 2018-10-12 MED ORDER — POTASSIUM CHLORIDE CRYS ER 20 MEQ PO TBCR: 40.0000 meq | EXTENDED_RELEASE_TABLET | Freq: Once | ORAL | Status: DC

## 2018-10-12 MED ORDER — FUROSEMIDE 10 MG/ML IJ SOLN
80.0000 mg | Freq: Once | INTRAMUSCULAR | Status: AC
  Administered 2018-10-12: 80 mg via INTRAVENOUS
  Filled 2018-10-12: qty 8

## 2018-10-12 NOTE — Anesthesia Post-op Follow-up Note (Signed)
Anesthesia QCDR form completed.        

## 2018-10-12 NOTE — Anesthesia Preprocedure Evaluation (Signed)
Anesthesia Evaluation  Patient identified by MRN, date of birth, ID band Patient awake    Reviewed: Allergy & Precautions, H&P , NPO status , Patient's Chart, lab work & pertinent test results, reviewed documented beta blocker date and time   History of Anesthesia Complications (+) history of anesthetic complications  Airway Mallampati: I  TM Distance: >3 FB Neck ROM: full    Dental  (+) Caps, Missing, Poor Dentition, Dental Advidsory Given   Pulmonary shortness of breath and with exertion, sleep apnea , neg COPD, Recent URI , Residual Cough, former smoker,           Cardiovascular Exercise Tolerance: Good hypertension, (-) angina+CHF and + Orthopnea  (-) CAD, (-) Past MI, (-) Cardiac Stents and (-) CABG + dysrhythmias Atrial Fibrillation (-) Valvular Problems/Murmurs     Neuro/Psych neg Seizures PSYCHIATRIC DISORDERS Anxiety  Neuromuscular disease (neuropathy)    GI/Hepatic Neg liver ROS, GERD  Medicated,  Endo/Other  diabetesHypothyroidism Morbid obesity  Renal/GU CRFRenal disease  negative genitourinary   Musculoskeletal   Abdominal   Peds  Hematology  (+) Blood dyscrasia, anemia ,   Anesthesia Other Findings Past Medical History: No date: Anemia No date: Anxiety No date: Chronic kidney disease     Comment: INSUFFICIENCY No date: Diabetes mellitus without complication (HCC) No date: Dyspnea     Comment: DOE No date: Dysrhythmia     Comment: A FIB No date: Edema No date: GERD (gastroesophageal reflux disease) No date: History of kidney stones No date: Hypertension No date: Hypothyroidism     Comment: ABLATION No date: Neuropathy No date: Orthopnea No date: Sleep apnea     Comment: CPAP No date: Vertigo No date: Wheezing   Reproductive/Obstetrics negative OB ROS                             Anesthesia Physical  Anesthesia Plan  ASA: III  Anesthesia Plan: General    Post-op Pain Management:    Induction: Intravenous  PONV Risk Score and Plan: 3 and Propofol infusion and TIVA  Airway Management Planned: Natural Airway and Nasal Cannula  Additional Equipment:   Intra-op Plan:   Post-operative Plan:   Informed Consent: I have reviewed the patients History and Physical, chart, labs and discussed the procedure including the risks, benefits and alternatives for the proposed anesthesia with the patient or authorized representative who has indicated his/her understanding and acceptance.     Dental Advisory Given  Plan Discussed with: Anesthesiologist, CRNA and Surgeon  Anesthesia Plan Comments:         Anesthesia Quick Evaluation

## 2018-10-12 NOTE — OR Nursing (Signed)
Pt has finished Duoneb treatment, she reports she feels she can breath easier and headache is much better.

## 2018-10-12 NOTE — ED Triage Notes (Addendum)
Pt to triage via w/c with no distress noted, brought in by EMS for c/o [facility]; pt reports d/c at 6pm ( was admitted for bronchitis and currently taking antibiotics but having persistent SHOB with upper chest tightness; had endoscopy today for hgb 5 and received 1unit PRBCs; st began having SHOB after proced and received O2 and neb; nailbeds cyanotic: O2 sat 79% on ra; pt placed on O2 at 5l/min via [place] to bring sat to 93%; charge nurse called for exam room

## 2018-10-12 NOTE — Transfer of Care (Signed)
Immediate Anesthesia Transfer of Care Note  Patient: Andrea Rivers  Procedure(s) Performed: ESOPHAGOGASTRODUODENOSCOPY (EGD) (N/A )  Patient Location: PACU  Anesthesia Type:General  Level of Consciousness: awake, alert  and oriented  Airway & Oxygen Therapy: Patient Spontanous Breathing and Patient connected to nasal cannula oxygen  Post-op Assessment: Report given to RN and Post -op Vital signs reviewed and stable  Post vital signs: Reviewed and stable  Last Vitals:  Vitals Value Taken Time  BP 105/75 10/12/2018 12:44 PM  Temp    Pulse 58 10/12/2018 12:46 PM  Resp 14 10/12/2018 12:46 PM  SpO2 94 % 10/12/2018 12:46 PM  Vitals shown include unvalidated device data.  Last Pain:  Vitals:   10/12/18 1231  TempSrc: Tympanic  PainSc: 0-No pain      Patients Stated Pain Goal: 0 (23/95/32 0233)  Complications: No apparent anesthesia complications

## 2018-10-12 NOTE — Progress Notes (Signed)
[place] [facility] [place] NAME: Andrea Rivers    MR#:  [id number]  DATE OF BIRTH:  08/05/1956  SUBJECTIVE:  patient came in with increasing shortness of breath and fell right fungi hemoglobin of 5.5. Denies any melena not extolled or vomiting. Denies any bright red blood per rectum or any hematuria.  Status post one unit blood transfusion. Awaiting EGD No new complaints per pt or RN  REVIEW OF SYSTEMS:   Review of Systems  Constitutional: Negative for chills, fever and weight loss.  HENT: Negative for ear discharge, ear pain and nosebleeds.   Eyes: Negative for blurred vision, pain and discharge.  Respiratory: Negative for sputum production, shortness of breath, wheezing and stridor.   Cardiovascular: Negative for chest pain, palpitations, orthopnea and PND.  Gastrointestinal: Negative for abdominal pain, diarrhea, nausea and vomiting.  Genitourinary: Negative for frequency and urgency.  Musculoskeletal: Negative for back pain and joint pain.  Neurological: Positive for weakness. Negative for sensory change, speech change and focal weakness.  Psychiatric/Behavioral: Negative for depression and hallucinations. The patient is not nervous/anxious.    Tolerating Diet:yes Tolerating SW:[id number]  DRUG ALLERGIES:   Allergies  Allergen Reactions  . Other Other (See Comments)    ILOSONE- Caused GI distress also  . Latex Hives  . Exenatide Nausea Only and Other (See Comments)    Byetta- Nausea and abdominal pain, also  . Garlic Other (See Comments)    Severe acid reflux  . Onion Other (See Comments)    Severe acid reflux  . Rosiglitazone Other (See Comments)    Avandia- Affected heart  . Erythromycin Nausea And Vomiting    GI DISTRESS    VITALS:  Blood pressure (!) 120/51, pulse 64, temperature 98.5 F (36.9 C), temperature source Oral, resp. rate 20, height 5\' 5"  (1.651 m), weight (!) 148 kg, SpO2 94 %.  PHYSICAL EXAMINATION:    Physical Exam  GENERAL:  63 y.o.-year-old patient lying in the bed with no acute distress. obese EYES: Pupils equal, round, reactive to light and accommodation. No scleral icterus. Extraocular muscles intact. Pallor+ HEENT: Head atraumatic, normocephalic. Oropharynx and nasopharynx clear.  NECK:  Supple, no jugular venous distention. No thyroid enlargement, no tenderness.  LUNGS: Normal breath sounds bilaterally, no wheezing, rales, rhonchi. No use of accessory muscles of respiration.  CARDIOVASCULAR: S1, S2 normal. No murmurs, rubs, or gallops.  ABDOMEN: Soft, nontender, nondistended. Bowel sounds present. No organomegaly or mass.  EXTREMITIES: No cyanosis, clubbing or edema b/l.    NEUROLOGIC: Cranial nerves II through XII are intact. No focal Motor or sensory deficits b/l.   PSYCHIATRIC:  patient is alert and oriented x 3.  SKIN: No obvious rash, lesion, or ulcer.   LABORATORY PANEL:  CBC Recent Labs  Lab 10/11/18 1056  WBC 11.4*  HGB 11.3*  HCT 37.0  PLT 212    Chemistries  Recent Labs  Lab 10/11/18 1056  NA 140  K 2.8*  CL 91*  CO2 37*  GLUCOSE 162*  BUN 50*  CREATININE 1.99*  CALCIUM 9.1  MG 2.6*   Cardiac Enzymes Recent Labs  Lab 10/10/18 2053  TROPONINI 0.06*   RADIOLOGY:  Dg Chest 2 View  Result Date: 10/10/2018 CLINICAL DATA:  Shortness of breath. EXAM: CHEST - 2 VIEW COMPARISON:  Chest radiograph August 09, 2018 FINDINGS: Mild cardiomegaly. Mediastinal silhouette is not suspicious. No pleural effusion or focal consolidation. No pneumothorax. Mild thoracic spondylosis. Large body habitus. IMPRESSION: Mild cardiomegaly.  No  acute pulmonary process. Electronically Signed   By: Elon Alas M.D.   On: 10/10/2018 21:20   ASSESSMENT AND PLAN:  Andrea Rivers  is a 63 y.o. female who presents with chief complaint as above.  Patient presents the ED with a complaint of chest discomfort and shortness of breath.  She has a history of CAD, but was also found  in the ED to have a hemoglobin of 5.5.  She states that she has chronic A. fib and is on warfarin for the same.  She also states that she is not been taking her Protonix for the last 6 months.    * GI bleed - likley PUD in the setting of chronic warfarin -status post one unit PRBC transfusion ordered  -came in with hemoglobin of 5.5 --- 1unit blood transfusion hemoglobin 11.3  -on Protonix drip - G.I. consultation by Dr. Alice Reichert. Patient is now agreeable with EGD. She does not want any further workup after EGD.  *  Chest pain - suspect this is demand ischemia given her significant anemia.  Transfusing blood tonight. -Denies any chest pain  -her cardiologist is Dr. Ubaldo Glassing  *  Chronic diastolic heart failure ([place]) -continue home meds  *  HTN (hypertension) -home dose antihypertensives   * Diabetes ([place]) -sliding scale insulin coverage  *  Atrial fibrillation (HCC) -hold warfarin for now, continue other meds  *  CKD (chronic kidney disease) -at baseline, avoid nephrotoxins   *  Hypothyroidism -home dose thyroid replacement   CODE STATUS: full  DVT Prophylaxis: SCD  TOTAL TIME TAKING CARE OF THIS PATIENT: *30* minutes.  >50% time spent on counselling and coordination of care  POSSIBLE D/C IN *1-2* DAYS, DEPENDING ON CLINICAL CONDITION.  Note: This dictation was prepared with Dragon dictation along with smaller phrase technology. Any transcriptional errors that result from this process are unintentional.  Fritzi Mandes M.D on 10/12/2018 at 8:22 AM  Between 7am to 6pm - Pager - [phone]  After 6pm go to [email or web address] - password [email or web address] [facility]  Office  [phone]  CC: Primary care physician; Sofie Hartigan, MDPatient ID: Andrea Rivers, female   DOB: 06/06/1956, 63 y.o.   MRN: [id number]

## 2018-10-12 NOTE — Discharge Summary (Signed)
Andrea Rivers at [place] NAME: Andrea Rivers    MR#:  [id number]  DATE OF BIRTH:  1956-02-18  DATE OF ADMISSION:  10/10/2018 ADMITTING PHYSICIAN: Lance Coon, MD  DATE OF DISCHARGE: 10/12/2018  PRIMARY CARE PHYSICIAN: Sofie Hartigan, MD    ADMISSION DIAGNOSIS:  Palpitations [R00.2] Gastrointestinal hemorrhage, unspecified gastrointestinal hemorrhage type [K92.2] Anemia, unspecified type [D64.9]  DISCHARGE DIAGNOSIS:  Symptomatic anemia with slow GI bleed due to Gastritis  Afib on coumadin (resumed)  SECONDARY DIAGNOSIS:   Past Medical History:  Diagnosis Date  . Anemia   . Anxiety   . CHF (congestive heart failure) ([place])   . Chronic kidney disease    INSUFFICIENCY  . Complication of anesthesia    had to be intubated during cataract surgery   " I could not breath "  . COPD (chronic obstructive pulmonary disease) ([place])   . Diabetes mellitus without complication ([place])   . Dyspnea    DOE  . Dysrhythmia    A FIB  . Edema   . Fracture of distal femur ([place]) 11/2017   left   . GERD (gastroesophageal reflux disease)   . History of kidney stones   . Hypertension   . Hypothyroidism    ABLATION  . Iron deficiency anemia 03/31/2017  . Neuropathy   . Orthopnea   . Sleep apnea    CPAP  . Vertigo   . Wheezing     HOSPITAL COURSE:  DorothyBrooksis a62 y.o.femalewho presents with chief complaint as above. Patient presents the ED with a complaint of chest discomfort and shortness of breath. She has a history of CAD, but was also found in the ED to have a hemoglobin of 5.5. She states that she has chronic A. fib and is on warfarin for the same. She also states that she is not been taking her Protonix for the last 6 months.   * GI bleed - likley PUD in the setting of chronic warfarin -status post one unit PRBC transfusion ordered  -came in with hemoglobin of 5.5 --- 1unit blood transfusion hemoglobin 11.3  -on  Protonix drip - G.I. consultation by Dr. Alice Reichert.  -EGD showed gastritis, single gastric polyp resected. Normal esophagus - She does not want any further workup after EGD.  *Chest pain - suspect this is demand ischemia given her significant anemia. Transfusing blood tonight. -Denies any chest pain  -her cardiologist is Dr. Ubaldo Glassing  *Chronic diastolic heart failure ([place]) -continue home meds  *HTN (hypertension) -home dose antihypertensives  *Diabetes ([place]) -sliding scale insulin coverage  *Atrial fibrillation ([place]) - resumed warfarin after EGD. Ok with GI  *CKD (chronic kidney disease) -at baseline, avoid nephrotoxins   *Hypothyroidism -home dose thyroid replacement  pt will d/c later today  Out pt f/u with Dr Ellison Hughs for PT/INR check in 203 days  CONSULTS OBTAINED:  Treatment Team:  Efrain Sella, MD  DRUG ALLERGIES:   Allergies  Allergen Reactions  . Other Other (See Comments)    ILOSONE- Caused GI distress also  . Latex Hives  . Exenatide Nausea Only and Other (See Comments)    Byetta- Nausea and abdominal pain, also  . Garlic Other (See Comments)    Severe acid reflux  . Onion Other (See Comments)    Severe acid reflux  . Rosiglitazone Other (See Comments)    Avandia- Affected heart  . Erythromycin Nausea And Vomiting    GI DISTRESS    DISCHARGE MEDICATIONS:   Allergies  as of 10/12/2018      Reactions   Other Other (See Comments)   ILOSONE- Caused GI distress also   Latex Hives   Exenatide Nausea Only, Other (See Comments)   Byetta- Nausea and abdominal pain, also   Garlic Other (See Comments)   Severe acid reflux   Onion Other (See Comments)   Severe acid reflux   Rosiglitazone Other (See Comments)   Avandia- Affected heart   Erythromycin Nausea And Vomiting   GI DISTRESS      Medication List    STOP taking these medications   amoxicillin-clavulanate 875-125 MG tablet Commonly known as:  AUGMENTIN     TAKE these  medications   acetaminophen 500 MG tablet Commonly known as:  TYLENOL Take 1,000 mg by mouth 2 (two) times daily as needed.   amiodarone 200 MG tablet Commonly known as:  PACERONE Take 200 mg by mouth daily.   azelastine 0.1 % nasal spray Commonly known as:  ASTELIN Place 2 sprays into both nostrils 2 (two) times daily as needed.   Biotin 10 MG Caps Take 1 capsule by mouth daily.   BOSWELLIA PO Take by mouth.   CALCIUM 600-D 600-400 MG-UNIT Tabs Generic drug:  Calcium Carbonate-Vitamin D3 Take 1 tablet by mouth daily.   CENTRUM WOMEN Tabs Take 1 tablet by mouth daily.   citalopram 20 MG tablet Commonly known as:  CELEXA Take 40 mg by mouth daily.   clonazePAM 1 MG tablet Commonly known as:  KLONOPIN Take 1 tablet by mouth 2 (two) times daily as needed.   cloNIDine 0.1 MG tablet Commonly known as:  CATAPRES Take 1 tablet (0.1 mg total) by mouth 2 (two) times daily.   Co-Enzyme Q-10 100 MG Caps Take 100 mg by mouth daily.   cyclobenzaprine 5 MG tablet Commonly known as:  FLEXERIL Take 5 mg by mouth 3 (three) times daily as needed for muscle spasms.   D3 HIGH POTENCY 125 MCG (5000 UT) capsule Generic drug:  Cholecalciferol Take 5,000 Units by mouth daily.   docusate sodium 50 MG capsule Commonly known as:  COLACE Take 100 mg by mouth 2 (two) times daily.   doxycycline 100 MG capsule Commonly known as:  VIBRAMYCIN Take 100 mg by mouth 2 (two) times daily.   feeding supplement (PRO-STAT SUGAR FREE 64) Liqd Take 30 mLs by mouth 3 (three) times daily with meals.   Fish Oil 1000 MG Cpdr Take 1 capsule by mouth daily.   gabapentin 100 MG capsule Commonly known as:  NEURONTIN Take 100 mg by mouth 3 (three) times daily.   Garlic 3151 MG Caps Take 1,000 mg by mouth daily.   glimepiride 2 MG tablet Commonly known as:  AMARYL Take 2 mg by mouth daily with breakfast.   HYDROcodone-acetaminophen 10-325 MG tablet Commonly known as:  NORCO Take 1 tablet by  mouth every 6 (six) hours as needed.   insulin lispro 100 UNIT/ML injection Commonly known as:  HUMALOG Inject into the skin 3 (three) times daily before meals.   LEVEMIR FLEXTOUCH 100 UNIT/ML Pen Generic drug:  Insulin Detemir Inject 23 Units into the skin at bedtime.   levothyroxine 175 MCG tablet Commonly known as:  SYNTHROID, LEVOTHROID Take 175 mcg by mouth every evening.   loratadine 10 MG tablet Commonly known as:  CLARITIN Take 1 tablet (10 mg total) by mouth daily.   LORazepam 1 MG tablet Commonly known as:  ATIVAN Take 1 tablet (1 mg total) by mouth 2 (two)  times daily.   lovastatin 20 MG tablet Commonly known as:  MEVACOR Take 20 mg by mouth at bedtime.   magnesium oxide 400 MG tablet Commonly known as:  MAG-OX Take 400 mg by mouth 2 (two) times daily.   meclizine 12.5 MG tablet Commonly known as:  ANTIVERT Take 1 tablet (12.5 mg total) by mouth 3 (three) times daily as needed for dizziness.   metolazone 5 MG tablet Commonly known as:  ZAROXOLYN Take 5 mg by mouth as needed (for a weight gain of 2 pounds overnight or 5 pounds in a week; MAX OF 5 TABLETS AT A TIME).   pantoprazole 40 MG tablet Commonly known as:  PROTONIX Take 1 tablet (40 mg total) by mouth 2 (two) times daily before a meal. What changed:   when to take this reasons to take this   polyethylene glycol packet Commonly known as:  MIRALAX / GLYCOLAX Take 17 g by mouth 2 (two) times daily.   potassium chloride SA 20 MEQ tablet Commonly known as:  K-DUR,KLOR-CON Take 2 tablets (40 mEq total) by mouth daily.   predniSONE 20 MG tablet Commonly known as:  DELTASONE Take 20 mg by mouth as directed.   PROAIR HFA 108 (90 Base) MCG/ACT inhaler Generic drug:  albuterol inhale 2 puffs by mouth INTO LUNGS every 4 to 6 hours if needed for wheezing or shortness of breath   albuterol (2.5 MG/3ML) 0.083% nebulizer solution Commonly known as:  PROVENTIL Inhale 3 mLs (2.5 mg total) into the lungs  every 4 (four) hours as needed for wheezing or shortness of breath.   senna 8.6 MG Tabs tablet Commonly known as:  SENOKOT Take 2 tablets by mouth 2 (two) times daily.   senna-docusate 8.6-50 MG tablet Commonly known as:  Senokot-S Take 1 tablet by mouth at bedtime as needed for mild constipation.   sodium phosphate 7-19 GM/118ML Enem Place 133 mLs (1 enema total) rectally once as needed for severe constipation.   torsemide 20 MG tablet Commonly known as:  DEMADEX TAKE 2 TABLETS BY MOUTH TWICE A DAY   TRADJENTA 5 MG Tabs tablet Generic drug:  linagliptin take 1 tablet by mouth once daily   vitamin C 500 MG tablet Commonly known as:  ASCORBIC ACID Take 500 mg by mouth daily.   warfarin 5 MG tablet Commonly known as:  COUMADIN Take 2.5 mg by mouth daily at 6 PM. What changed:  Another medication with the same name was removed. Continue taking this medication, and follow the directions you see here.   WIXELA INHUB 250-50 MCG/DOSE Aepb Generic drug:  Fluticasone-Salmeterol Inhale 1 puff into the lungs 2 (two) times daily.   zinc sulfate 220 (50 Zn) MG capsule Take 220 mg by mouth daily.       If you experience worsening of your admission symptoms, develop shortness of breath, life threatening emergency, suicidal or homicidal thoughts you must seek medical attention immediately by calling 911 or calling your MD immediately  if symptoms less severe.  You Must read complete instructions/literature along with all the possible adverse reactions/side effects for all the Medicines you take and that have been prescribed to you. Take any new Medicines after you have completely understood and accept all the possible adverse reactions/side effects.   Please note  You were cared for by a hospitalist during your hospital stay. If you have any questions about your discharge medications or the care you received while you were in the hospital after you are discharged,  you can call the unit  and asked to speak with the hospitalist on call if the hospitalist that took care of you is not available. Once you are discharged, your primary care physician will handle any further medical issues. Please note that NO REFILLS for any discharge medications will be authorized once you are discharged, as it is imperative that you return to your primary care physician (or establish a relationship with a primary care physician if you do not have one) for your aftercare needs so that they can reassess your need for medications and monitor your lab values.  DATA REVIEW:   CBC  Recent Labs  Lab 10/11/18 1056  WBC 11.4*  HGB 11.3*  HCT 37.0  PLT 212    Chemistries  Recent Labs  Lab 10/11/18 1056 10/12/18 0944  NA 140  --   K 2.8* 3.2*  CL 91*  --   CO2 37*  --   GLUCOSE 162*  --   BUN 50*  --   CREATININE 1.99*  --   CALCIUM 9.1  --   MG 2.6*  --     Microbiology Results   No results found for this or any previous visit (from the past 240 hour(s)).  RADIOLOGY:  Dg Chest 2 View  Result Date: 10/10/2018 CLINICAL DATA:  Shortness of breath. EXAM: CHEST - 2 VIEW COMPARISON:  Chest radiograph August 09, 2018 FINDINGS: Mild cardiomegaly. Mediastinal silhouette is not suspicious. No pleural effusion or focal consolidation. No pneumothorax. Mild thoracic spondylosis. Large body habitus. IMPRESSION: Mild cardiomegaly.  No acute pulmonary process. Electronically Signed   By: Elon Alas M.D.   On: 10/10/2018 21:20     CODE STATUS:     Code Status Orders  (From admission, onward)         Start     Ordered   10/11/18 0838  Full code  Continuous     10/11/18 0837        Code Status History    Date Active Date Inactive Code Status Order ID Comments User Context   11/14/2017 1816 11/21/2017 2158 Full Code [id number]  Elease Hashimoto Inpatient   02/22/2017 1003 03/03/2017 1712 Full Code [id number]  Dustin Flock, MD Inpatient   01/26/2017 1636 01/28/2017 1234 Full Code  [id number]  Baxter Hire, MD Inpatient      TOTAL TIME TAKING CARE OF THIS PATIENT: *40* minutes.    Fritzi Mandes M.D on 10/12/2018 at 1:28 PM  Between 7am to 6pm - Pager - [phone] After 6pm go to [email or web address] - password [email or web address] [facility]  Office  [phone]  CC: Primary care physician; Sofie Hartigan, MD

## 2018-10-12 NOTE — Interval H&P Note (Signed)
History and Physical Interval Note:  10/12/2018 10:32 AM  Andrea Rivers  has presented today for surgery, with the diagnosis of Symptomatic anemia, hemoccult positive stool  The various methods of treatment have been discussed with the patient and family. After consideration of risks, benefits and other options for treatment, the patient has consented to  Procedure(s): ESOPHAGOGASTRODUODENOSCOPY (EGD) (N/A) as a surgical intervention .  The patient's history has been reviewed, patient examined, no change in status, stable for surgery.  I have reviewed the patient's chart and labs.  Questions were answered to the patient's satisfaction.     [place], [place]

## 2018-10-12 NOTE — Op Note (Signed)
[facility] Gastroenterology Patient Name: Andrea Rivers Procedure Date: 10/12/2018 11:29 AM MRN: [id number] Account #: [id number] Date of Birth: 1956-01-13 Admit Type: Inpatient Age: 63 Room: [facility] ENDO ROOM 1 Gender: Female Note Status: Finalized Procedure:            Upper GI endoscopy Indications:          Iron deficiency anemia secondary to chronic blood loss Providers:            Benay Pike. Alice Reichert MD, MD Referring MD:         Sofie Hartigan (Referring MD) Medicines:            Propofol per Anesthesia Complications:        No immediate complications. Procedure:            Pre-Anesthesia Assessment:                       - The risks and benefits of the procedure and the                        sedation options and risks were discussed with the                        patient. All questions were answered and informed                        consent was obtained.                       - Patient identification and proposed procedure were                        verified prior to the procedure by the nurse. The                        procedure was verified in the procedure room.                       - ASA Grade Assessment: IV - A patient with severe                        systemic disease that is a constant threat to life.                       - After reviewing the risks and benefits, the patient                        was deemed in satisfactory condition to undergo the                        procedure.                       After obtaining informed consent, the endoscope was                        passed under direct vision. Throughout the procedure,                        the patient's blood pressure, pulse, and oxygen  saturations were monitored continuously. The Endoscope                        was introduced through the mouth, and advanced to the                        third part of duodenum. The upper GI endoscopy was                         accomplished without difficulty. The patient tolerated                        the procedure well. Findings:      The examined esophagus was normal.      Patchy moderate inflammation characterized by congestion (edema),       erosions and erythema was found in the entire examined stomach.      A single 5 mm sessile polyp with no bleeding and no stigmata of recent       bleeding was found in the gastric body. The polyp was removed with a       cold biopsy forceps. Resection and retrieval were complete.      The examined duodenum was normal.      The exam was otherwise without abnormality. Impression:           - Normal esophagus.                       - Gastritis.                       - A single gastric polyp. Resected and retrieved.                       - Normal examined duodenum.                       - The examination was otherwise normal. Recommendation:       - Await pathology results.                       - Return patient to hospital ward for ongoing care.                       - Potential source of heme positive stool, but                        colonoscopy is still advised. Patient declines                        procedure.                       - Continue present medications.                       - Cardiac diet. Procedure Code(s):    --- Professional ---                       [phone], LT, Esophagogastroduodenoscopy, flexible,                        transoral; with biopsy, single or multiple Diagnosis  Code(s):    --- Professional ---                       D50.0, Iron deficiency anemia secondary to blood loss                        (chronic)                       K31.7, Polyp of stomach and duodenum                       K29.70, Gastritis, unspecified, without bleeding CPT copyright 2018 American Medical Association. All rights reserved. The codes documented in this report are preliminary and upon coder review may  be revised to meet current compliance  requirements. Efrain Sella MD, MD 10/12/2018 12:28:02 PM This report has been signed electronically. Number of Addenda: 0 Note Initiated On: 10/12/2018 11:29 AM      [place]

## 2018-10-12 NOTE — Discharge Instructions (Signed)
GET PT INR checked by PCP in 2-3 days

## 2018-10-12 NOTE — Care Management Note (Signed)
Case Management Note  Patient Details  Name: Andrea Rivers MRN: [id number] Date of Birth: 08/28/1955  Subjective/Objective:      Patient is from home alone. Independent in ADL's.  Husband visits several times a week-they are separated but seem to have a very good relationship.  History of CHF; has a functioning scale at home.  [facility].  Current with PCP and obtains medications without difficulty.  Uses a walker to ambulate.  Currently on 2L oxygen acute.    Offered HH services and patient declines.  She states she has been ambulating in room with walker without difficulty.   Readmission risk 38%.  She is on warfarin for atrial fibrillation.  No further needs identified by [facility] as patient is compliant and current with PCP and medications.              Action/Plan:   Expected Discharge Date:  10/12/18               Expected Discharge Plan:  Home/Self Care  In-House Referral:     Discharge planning Services  CM Consult, HF Clinic  Post Acute Care Choice:    Choice offered to:     DME Arranged:    DME Agency:     HH Arranged:  Patient Refused [place] Agency:     Status of Service:  Completed, signed off  If discussed at [employer] of Stay Meetings, dates discussed:    Additional Comments:  Elza Rafter, RN 10/12/2018, 2:42 PM

## 2018-10-12 NOTE — Anesthesia Postprocedure Evaluation (Signed)
Anesthesia Post Note  Patient: Andrea Rivers  Procedure(s) Performed: ESOPHAGOGASTRODUODENOSCOPY (EGD) (N/A )  Patient location during evaluation: Endoscopy Anesthesia Type: General Level of consciousness: awake and alert Pain management: pain level controlled Vital Signs Assessment: post-procedure vital signs reviewed and stable Respiratory status: spontaneous breathing, nonlabored ventilation, respiratory function stable and patient connected to nasal cannula oxygen Cardiovascular status: blood pressure returned to baseline and stable Postop Assessment: no apparent nausea or vomiting Anesthetic complications: no Comments: Patient did have an episode of hypoxia post procedure, but improved with face mask and a duoneb.  Patient reported being back to baseline after her nebulizer treatment.     Last Vitals:  Vitals:   10/12/18 1351 10/12/18 1401  BP: (!) 110/57 109/70  Pulse: (!) 59 60  Resp: 13 17  Temp:    SpO2: 98% 97%    Last Pain:  Vitals:   10/12/18 1301  TempSrc:   PainSc: 0-No pain                 Martha Clan

## 2018-10-12 NOTE — OR Nursing (Signed)
VS and Ekg monitors had been dc'd at 1310, pt c/o severe head ache and chest feeling funny.  Cardiac monitor and O2 sat and BP reapplied.  O2 per mask at 4 liter pt feeling better.   Dr, Rosey Bath into see pt.  Duo neb ordered.

## 2018-10-12 NOTE — Anesthesia Procedure Notes (Signed)
Date/Time: 10/12/2018 12:26 PM Performed by: Nelda Marseille, CRNA Pre-anesthesia Checklist: Patient identified, Emergency Drugs available, Suction available, Patient being monitored and Timeout performed Oxygen Delivery Method: Nasal cannula

## 2018-10-13 ENCOUNTER — Inpatient Hospital Stay: Payer: Medicare Other | Attending: Oncology | Admitting: Oncology

## 2018-10-13 ENCOUNTER — Other Ambulatory Visit: Payer: Self-pay

## 2018-10-13 ENCOUNTER — Observation Stay
Admit: 2018-10-13 | Discharge: 2018-10-13 | Disposition: A | Discharge status: Home / Self Care | Payer: Medicare Other | Attending: Internal Medicine | Admitting: Internal Medicine

## 2018-10-13 DIAGNOSIS — I5033 Acute on chronic diastolic (congestive) heart failure: Secondary | ICD-10-CM

## 2018-10-13 LAB — BASIC METABOLIC PANEL
ANION GAP: 10 (ref 5–15)
BUN: 51 mg/dL — ABNORMAL HIGH (ref 8–23)
CO2: 36 mmol/L — ABNORMAL HIGH (ref 22–32)
Calcium: 8.3 mg/dL — ABNORMAL LOW (ref 8.9–10.3)
Chloride: 98 mmol/L (ref 98–111)
Creatinine, Ser: 2.02 mg/dL — ABNORMAL HIGH (ref 0.44–1.00)
GFR calc Af Amer: 30 mL/min — ABNORMAL LOW (ref >60)
GFR calc non Af Amer: 26 mL/min — ABNORMAL LOW (ref >60)
Glucose, Bld: 175 mg/dL — ABNORMAL HIGH (ref 70–99)
Potassium: 3.2 mmol/L — ABNORMAL LOW (ref 3.5–5.1)
SODIUM: 144 mmol/L (ref 135–145)

## 2018-10-13 LAB — COMPREHENSIVE METABOLIC PANEL
ALT: 22 U/L (ref 0–44)
AST: 45 U/L — ABNORMAL HIGH (ref 15–41)
Albumin: 3.8 g/dL (ref 3.5–5.0)
Alkaline Phosphatase: 81 U/L (ref 38–126)
Anion gap: 13 (ref 5–15)
BUN: 51 mg/dL — ABNORMAL HIGH (ref 8–23)
CO2: 34 mmol/L — ABNORMAL HIGH (ref 22–32)
Calcium: 8.7 mg/dL — ABNORMAL LOW (ref 8.9–10.3)
Chloride: 94 mmol/L — ABNORMAL LOW (ref 98–111)
Creatinine, Ser: 1.98 mg/dL — ABNORMAL HIGH (ref 0.44–1.00)
GFR calc Af Amer: 30 mL/min — ABNORMAL LOW (ref >60)
GFR calc non Af Amer: 26 mL/min — ABNORMAL LOW (ref >60)
Glucose, Bld: 233 mg/dL — ABNORMAL HIGH (ref 70–99)
Potassium: 3.8 mmol/L (ref 3.5–5.1)
Sodium: 141 mmol/L (ref 135–145)
Total Bilirubin: 0.9 mg/dL (ref 0.3–1.2)
Total Protein: 8.7 g/dL — ABNORMAL HIGH (ref 6.5–8.1)

## 2018-10-13 LAB — CBC WITH DIFFERENTIAL/PLATELET
Abs Immature Granulocytes: 0.13 10*3/uL — ABNORMAL HIGH (ref 0.00–0.07)
BASOS PCT: 0 %
Basophils Absolute: 0 10*3/uL (ref 0.0–0.1)
Eosinophils Absolute: 0 10*3/uL (ref 0.0–0.5)
Eosinophils Relative: 0 %
HCT: 40.5 % (ref 36.0–46.0)
Hemoglobin: 12 g/dL (ref 12.0–15.0)
Immature Granulocytes: 1 %
Lymphocytes Relative: 5 %
Lymphs Abs: 0.7 10*3/uL (ref 0.7–4.0)
MCH: 26.5 pg (ref 26.0–34.0)
MCHC: 29.6 g/dL — ABNORMAL LOW (ref 30.0–36.0)
MCV: 89.4 fL (ref 80.0–100.0)
Monocytes Absolute: 0.7 10*3/uL (ref 0.1–1.0)
Monocytes Relative: 5 %
NRBC: 0 % (ref 0.0–0.2)
Neutro Abs: 13.4 10*3/uL — ABNORMAL HIGH (ref 1.7–7.7)
Neutrophils Relative %: 89 %
Platelets: 228 10*3/uL (ref 150–400)
RBC: 4.53 MIL/uL (ref 3.87–5.11)
RDW: 18.9 % — ABNORMAL HIGH (ref 11.5–15.5)
WBC: 15.1 10*3/uL — ABNORMAL HIGH (ref 4.0–10.5)

## 2018-10-13 LAB — GLUCOSE, CAPILLARY
GLUCOSE-CAPILLARY: 195 mg/dL — AB (ref 70–99)
Glucose-Capillary: 180 mg/dL — ABNORMAL HIGH (ref 70–99)
Glucose-Capillary: 149 mg/dL — ABNORMAL HIGH (ref 70–99)
Glucose-Capillary: 197 mg/dL — ABNORMAL HIGH (ref 70–99)
Glucose-Capillary: 203 mg/dL — ABNORMAL HIGH (ref 70–99)
Glucose-Capillary: 191 mg/dL — ABNORMAL HIGH (ref 70–99)

## 2018-10-13 LAB — ECHOCARDIOGRAM COMPLETE
Height: 65 in
WEIGHTICAEL: 5161.6 [oz_av]

## 2018-10-13 LAB — CBC
HCT: 36.7 % (ref 36.0–46.0)
Hemoglobin: 10.9 g/dL — ABNORMAL LOW (ref 12.0–15.0)
MCH: 26.4 pg (ref 26.0–34.0)
MCHC: 29.7 g/dL — ABNORMAL LOW (ref 30.0–36.0)
MCV: 88.9 fL (ref 80.0–100.0)
Platelets: 196 10*3/uL (ref 150–400)
RBC: 4.13 MIL/uL (ref 3.87–5.11)
RDW: 18.9 % — ABNORMAL HIGH (ref 11.5–15.5)
WBC: 14.1 10*3/uL — ABNORMAL HIGH (ref 4.0–10.5)
nRBC: 0 % (ref 0.0–0.2)

## 2018-10-13 LAB — PROTIME-INR
INR: 1.4 — ABNORMAL HIGH (ref 0.8–1.2)
Prothrombin Time: 17.1 seconds — ABNORMAL HIGH (ref 11.4–15.2)

## 2018-10-13 LAB — TROPONIN I: Troponin I: 0.05 ng/mL (ref <0.03)

## 2018-10-13 LAB — SURGICAL PATHOLOGY

## 2018-10-13 LAB — APTT: aPTT: 24 seconds (ref 24–36)

## 2018-10-13 LAB — BRAIN NATRIURETIC PEPTIDE: B Natriuretic Peptide: 379 pg/mL — ABNORMAL HIGH (ref 0.0–100.0)

## 2018-10-13 MED ORDER — POTASSIUM CHLORIDE CRYS ER 20 MEQ PO TBCR
20.0000 meq | EXTENDED_RELEASE_TABLET | Freq: Two times a day (BID) | ORAL | Status: DC
  Administered 2018-10-13 – 2018-10-16 (×7): 20 meq via ORAL
  Filled 2018-10-13 (×7): qty 1

## 2018-10-13 MED ORDER — INSULIN ASPART 100 UNIT/ML ~~LOC~~ SOLN: 0.0000 [IU] | Freq: Every day | SUBCUTANEOUS | Status: DC

## 2018-10-13 MED ORDER — PANTOPRAZOLE SODIUM 40 MG PO TBEC
40.0000 mg | DELAYED_RELEASE_TABLET | Freq: Two times a day (BID) | ORAL | Status: DC
  Administered 2018-10-13 – 2018-10-16 (×7): 40 mg via ORAL
  Filled 2018-10-13 (×7): qty 1

## 2018-10-13 MED ORDER — LORAZEPAM 1 MG PO TABS
1.0000 mg | ORAL_TABLET | Freq: Two times a day (BID) | ORAL | Status: DC
  Administered 2018-10-13 – 2018-10-16 (×7): 1 mg via ORAL
  Filled 2018-10-13 (×7): qty 1

## 2018-10-13 MED ORDER — ONDANSETRON HCL 4 MG/2ML IJ SOLN: 4.0000 mg | Freq: Four times a day (QID) | INTRAMUSCULAR | Status: DC | PRN

## 2018-10-13 MED ORDER — ACETAMINOPHEN 325 MG PO TABS
650.0000 mg | ORAL_TABLET | Freq: Four times a day (QID) | ORAL | Status: DC | PRN
  Administered 2018-10-13: 650 mg via ORAL
  Filled 2018-10-13: qty 2

## 2018-10-13 MED ORDER — CLONIDINE HCL 0.1 MG PO TABS
0.1000 mg | ORAL_TABLET | Freq: Two times a day (BID) | ORAL | Status: DC
  Administered 2018-10-13 – 2018-10-16 (×7): 0.1 mg via ORAL
  Filled 2018-10-13 (×7): qty 1

## 2018-10-13 MED ORDER — INSULIN DETEMIR 100 UNIT/ML ~~LOC~~ SOLN
15.0000 [IU] | Freq: Every day | SUBCUTANEOUS | Status: DC
  Administered 2018-10-13 – 2018-10-15 (×3): 15 [IU] via SUBCUTANEOUS
  Filled 2018-10-13 (×4): qty 0.15

## 2018-10-13 MED ORDER — TORSEMIDE 20 MG PO TABS
40.0000 mg | ORAL_TABLET | Freq: Two times a day (BID) | ORAL | Status: DC
  Administered 2018-10-13 – 2018-10-14 (×3): 40 mg via ORAL
  Filled 2018-10-13 (×3): qty 2

## 2018-10-13 MED ORDER — POTASSIUM CHLORIDE CRYS ER 20 MEQ PO TBCR
40.0000 meq | EXTENDED_RELEASE_TABLET | Freq: Once | ORAL | Status: AC
  Administered 2018-10-13: 40 meq via ORAL
  Filled 2018-10-13: qty 2

## 2018-10-13 MED ORDER — CITALOPRAM HYDROBROMIDE 20 MG PO TABS
20.0000 mg | ORAL_TABLET | Freq: Every day | ORAL | Status: DC
  Administered 2018-10-14 – 2018-10-16 (×3): 20 mg via ORAL
  Filled 2018-10-13 (×3): qty 1

## 2018-10-13 MED ORDER — INSULIN ASPART 100 UNIT/ML ~~LOC~~ SOLN
0.0000 [IU] | Freq: Three times a day (TID) | SUBCUTANEOUS | Status: DC
  Administered 2018-10-13: 2 [IU] via SUBCUTANEOUS
  Administered 2018-10-13: 1 [IU] via SUBCUTANEOUS
  Administered 2018-10-13: 3 [IU] via SUBCUTANEOUS
  Administered 2018-10-14: 1 [IU] via SUBCUTANEOUS
  Administered 2018-10-14: 2 [IU] via SUBCUTANEOUS
  Administered 2018-10-14: 1 [IU] via SUBCUTANEOUS
  Administered 2018-10-15: 2 [IU] via SUBCUTANEOUS
  Administered 2018-10-15 – 2018-10-16 (×3): 1 [IU] via SUBCUTANEOUS
  Administered 2018-10-16: 2 [IU] via SUBCUTANEOUS
  Filled 2018-10-13 (×11): qty 1

## 2018-10-13 MED ORDER — INSULIN DETEMIR 100 UNIT/ML FLEXPEN: 15.0000 [IU] | PEN_INJECTOR | Freq: Every day | SUBCUTANEOUS | Status: DC

## 2018-10-13 MED ORDER — ACETAMINOPHEN 650 MG RE SUPP: 650.0000 mg | Freq: Four times a day (QID) | RECTAL | Status: DC | PRN

## 2018-10-13 MED ORDER — SENNA 8.6 MG PO TABS
1.0000 | ORAL_TABLET | Freq: Two times a day (BID) | ORAL | Status: DC
  Administered 2018-10-14 – 2018-10-16 (×4): 8.6 mg via ORAL
  Filled 2018-10-13 (×7): qty 1

## 2018-10-13 MED ORDER — PRAVASTATIN SODIUM 20 MG PO TABS
20.0000 mg | ORAL_TABLET | Freq: Every day | ORAL | Status: DC
  Administered 2018-10-13 – 2018-10-15 (×3): 20 mg via ORAL
  Filled 2018-10-13 (×3): qty 1

## 2018-10-13 MED ORDER — FUROSEMIDE 10 MG/ML IJ SOLN
40.0000 mg | Freq: Every day | INTRAMUSCULAR | Status: DC
  Filled 2018-10-13: qty 4

## 2018-10-13 MED ORDER — WARFARIN SODIUM 2.5 MG PO TABS: 2.5000 mg | ORAL_TABLET | Freq: Every day | ORAL | Status: DC

## 2018-10-13 MED ORDER — OMEGA-3-ACID ETHYL ESTERS 1 G PO CAPS
1.0000 g | ORAL_CAPSULE | Freq: Every day | ORAL | Status: DC
  Administered 2018-10-14 – 2018-10-16 (×3): 1 g via ORAL
  Filled 2018-10-13 (×3): qty 1

## 2018-10-13 MED ORDER — WARFARIN - PHARMACIST DOSING INPATIENT
Freq: Every day | Status: DC
  Administered 2018-10-15: 19:00:00

## 2018-10-13 MED ORDER — AMIODARONE HCL 200 MG PO TABS
200.0000 mg | ORAL_TABLET | Freq: Every day | ORAL | Status: DC
  Administered 2018-10-13 – 2018-10-16 (×4): 200 mg via ORAL
  Filled 2018-10-13 (×4): qty 1

## 2018-10-13 MED ORDER — PERFLUTREN LIPID MICROSPHERE
1.0000 mL | INTRAVENOUS | Status: AC | PRN
  Administered 2018-10-13: 3 mL via INTRAVENOUS
  Filled 2018-10-13: qty 10

## 2018-10-13 MED ORDER — LINAGLIPTIN 5 MG PO TABS
5.0000 mg | ORAL_TABLET | Freq: Every day | ORAL | Status: DC
  Administered 2018-10-13 – 2018-10-16 (×4): 5 mg via ORAL
  Filled 2018-10-13 (×4): qty 1

## 2018-10-13 MED ORDER — TORSEMIDE 20 MG PO TABS: 40.0000 mg | ORAL_TABLET | Freq: Two times a day (BID) | ORAL | Status: DC

## 2018-10-13 MED ORDER — CITALOPRAM HYDROBROMIDE 20 MG PO TABS
40.0000 mg | ORAL_TABLET | Freq: Every day | ORAL | Status: DC
  Administered 2018-10-13: 40 mg via ORAL
  Filled 2018-10-13: qty 2

## 2018-10-13 MED ORDER — ONDANSETRON HCL 4 MG PO TABS: 4.0000 mg | ORAL_TABLET | Freq: Four times a day (QID) | ORAL | Status: DC | PRN

## 2018-10-13 MED ORDER — LEVOTHYROXINE SODIUM 50 MCG PO TABS
175.0000 ug | ORAL_TABLET | Freq: Every evening | ORAL | Status: DC
  Administered 2018-10-13 – 2018-10-15 (×3): 175 ug via ORAL
  Filled 2018-10-13 (×3): qty 1

## 2018-10-13 MED ORDER — BENZONATATE 100 MG PO CAPS
100.0000 mg | ORAL_CAPSULE | Freq: Two times a day (BID) | ORAL | Status: DC
  Administered 2018-10-13 – 2018-10-16 (×7): 100 mg via ORAL
  Filled 2018-10-13 (×7): qty 1

## 2018-10-13 MED ORDER — WARFARIN SODIUM 3 MG PO TABS
3.0000 mg | ORAL_TABLET | Freq: Once | ORAL | Status: AC
  Administered 2018-10-13: 3 mg via ORAL
  Filled 2018-10-13: qty 1

## 2018-10-13 NOTE — Progress Notes (Signed)
ANTICOAGULATION CONSULT NOTE - Initial Consult  Pharmacy Consult for warfarin dosing Indication: atrial fibrillation  Allergies  Allergen Reactions  . Other Other (See Comments)    ILOSONE- Caused GI distress also  . Latex Hives  . Exenatide Nausea Only and Other (See Comments)    Byetta- Nausea and abdominal pain, also  . Garlic Other (See Comments)    Severe acid reflux  . Onion Other (See Comments)    Severe acid reflux  . Rosiglitazone Other (See Comments)    Avandia- Affected heart  . Erythromycin Nausea And Vomiting    GI DISTRESS    Patient Measurements: Height: 5\' 5"  (165.1 cm) Weight: (!) 322 lb 9.6 oz (146.3 kg) IBW/kg (Calculated) : 57 Heparin Dosing Weight:   Vital Signs: Temp: 97.7 F (36.5 C) (03/06 0127) Temp Source: Oral (03/06 0127) BP: 152/83 (03/06 0127) Pulse Rate: 67 (03/06 0127)  Labs: Recent Labs    10/10/18 2053 10/11/18 1056 10/12/18 2350  HGB 5.5* 11.3* 12.0  HCT 18.9* 37.0 40.5  PLT 117* 212 228  APTT  --   --  24  LABPROT  --  18.4* 17.1*  INR  --  1.6* 1.4*  CREATININE 2.26* 1.99* 1.98*  TROPONINI 0.06*  --  0.05*    Estimated Creatinine Clearance: 42.6 mL/min (A) (by C-G formula based on SCr of 1.98 mg/dL (H)).   Medical History: Past Medical History:  Diagnosis Date  . Anemia   . Anxiety   . CHF (congestive heart failure) ([place])   . Chronic kidney disease    INSUFFICIENCY  . Complication of anesthesia    had to be intubated during cataract surgery   " I could not breath "  . COPD (chronic obstructive pulmonary disease) ([place])   . Diabetes mellitus without complication ([place])   . Dyspnea    DOE  . Dysrhythmia    A FIB  . Edema   . Fracture of distal femur ([place]) 11/2017   left   . GERD (gastroesophageal reflux disease)   . History of kidney stones   . Hypertension   . Hypothyroidism    ABLATION  . Iron deficiency anemia 03/31/2017  . Neuropathy   . Orthopnea   . Sleep apnea    CPAP  . Vertigo   . Wheezing      Medications:  Home warfarin dose 2.5 mg daily.   Assessment: INR subtherapeutic on admission.  Goal of Therapy:  INR 2-3   Plan:  Resume home dose as above. F/u INR as clinically indicated.  Lillah Standre S 10/13/2018,4:14 AM

## 2018-10-13 NOTE — ED Provider Notes (Signed)
[facility] Emergency Department Provider Note  ____________________________________________   First MD Initiated Contact with Patient 10/12/18 2302     (approximate)  I have reviewed the triage vital signs and the nursing notes.   HISTORY  Chief Complaint Shortness of Breath    HPI Andrea Rivers is a 63 y.o. female with medical history as listed below which notably includes CHF taking torsemide 40 mg twice a day and who was recently admitted to the hospital and just discharged today after being treated for acute blood loss anemia status post endoscopy earlier today.  She presents by EMS for evaluation of gradually worsening shortness of breath over the course of the afternoon and evening.  When EMS arrived she had labored breathing and an oxygen saturation of 79% on room air.  Her oxygenation came up to the mid 90s initially on 5 L but was weaned back to 3 L.  She says that she feels like she cannot catch her breath and that nothing particular makes it better and it is worse with exertion.  She does not have an oxygen requirement at baseline, does not smoke, and does not have COPD.  She feels a heaviness in her chest but feels like it is associated with the difficulty breathing.  She is having no sharp chest pain.  She feels like she is swollen including her legs and notes that she was not given her torsemide during the last 2 days of her hospitalization.  She was told to resume her medications in the morning.  She is also on warfarin and it was held today for the endoscopy but she is to resume it tomorrow as well.  She does not feel lightheaded or dizzy.  She denies fever/chills, chest pain, nausea, vomiting, and abdominal pain.  Her symptoms are severe.         Past Medical History:  Diagnosis Date  . Anemia   . Anxiety   . CHF (congestive heart failure) ([place])   . Chronic kidney disease    INSUFFICIENCY  . Complication of anesthesia    had to be  intubated during cataract surgery   " I could not breath "  . COPD (chronic obstructive pulmonary disease) ([place])   . Diabetes mellitus without complication ([place])   . Dyspnea    DOE  . Dysrhythmia    A FIB  . Edema   . Fracture of distal femur ([place]) 11/2017   left   . GERD (gastroesophageal reflux disease)   . History of kidney stones   . Hypertension   . Hypothyroidism    ABLATION  . Iron deficiency anemia 03/31/2017  . Neuropathy   . Orthopnea   . Sleep apnea    CPAP  . Vertigo   . Wheezing     Patient Active Problem List   Diagnosis Date Noted  . Acute on chronic diastolic CHF (congestive heart failure) ([place]) 10/13/2018  . GI bleed 10/10/2018  . Chest pain 10/10/2018  . Hypokalemia 06/08/2018  . Lymphedema 06/08/2018  . Closed displaced supracondylar fracture of distal end of left femur with intracondylar extension ([place]) 11/17/2017  . CKD (chronic kidney disease) 11/14/2017  . Hypothyroidism 11/14/2017  . Atrial fibrillation ([place]) 11/14/2017  . Anxiety state 11/14/2017  . Peripheral neuropathy 11/14/2017  . Fracture 11/14/2017  . Bradycardia 08/13/2017  . Microcytic anemia 03/31/2017  . Iron deficiency anemia 03/31/2017  . Chronic diastolic heart failure ([place]) 03/10/2017  . HTN (hypertension) 03/10/2017  . Diabetes (  [place]) 03/10/2017  . Obstructive sleep apnea 03/10/2017  . [facility])     Past Surgical History:  Procedure Laterality Date  . CATARACT EXTRACTION W/PHACO Left 01/26/2017   Procedure: CATARACT EXTRACTION PHACO AND INTRAOCULAR LENS PLACEMENT (IOC);  Surgeon: Estill Cotta, MD;  Location: [facility] ORS;  Service: Ophthalmology;  Laterality: Left;  [place]   1:02.2 AP     23.7 CDE   28.92 fluid pack lot# [id number] H exp.05/08/2018  . CHOLECYSTECTOMY    . ESOPHAGOGASTRODUODENOSCOPY N/A 10/12/2018   Procedure: ESOPHAGOGASTRODUODENOSCOPY (EGD);  Surgeon: Toledo, Benay Pike, MD;  Location: [facility] ENDOSCOPY;  Service: Gastroenterology;  Laterality: N/A;  . ORIF FEMUR  FRACTURE Left 11/17/2017   Procedure: OPEN REDUCTION INTERNAL FIXATION (ORIF) DISTAL FEMUR FRACTURE;  Surgeon: Shona Needles, MD;  Location: [place];  Service: Orthopedics;  Laterality: Left;  . TONSILLECTOMY      Prior to Admission medications   Medication Sig Start Date End Date Taking? Authorizing Provider  acetaminophen (TYLENOL) 500 MG tablet Take 1,000 mg by mouth 2 (two) times daily as needed for mild pain, fever or headache.    Yes [provider]  albuterol (PROAIR HFA) 108 (90 Base) MCG/ACT inhaler Inhale 2 puffs into the lungs every 4 (four) hours as needed for wheezing or shortness of breath.  03/14/17  Yes [provider]  amiodarone (PACERONE) 200 MG tablet Take 200 mg by mouth daily. 05/10/17  Yes [provider]  Biotin 10 MG CAPS Take 1 capsule by mouth daily.    Yes [provider]  Boswellia Serrata (BOSWELLIA PO) Take 1 capsule by mouth 2 (two) times daily.    Yes [provider]  Calcium Carbonate-Vitamin D3 (CALCIUM 600-D) 600-400 MG-UNIT TABS Take 1 tablet by mouth 3 (three) times daily with meals.    Yes [provider]  cetirizine (ZYRTEC) 10 MG tablet Take 10 mg by mouth daily.   Yes [provider]  Cholecalciferol (D3 HIGH POTENCY) 125 MCG (5000 UT) capsule Take 5,000 Units by mouth daily.    Yes [provider]  citalopram (CELEXA) 40 MG tablet Take 40 mg by mouth daily.    Yes [provider]  clonazePAM (KLONOPIN) 1 MG tablet Take 1 tablet by mouth 2 (two) times daily as needed for anxiety.  05/17/18  Yes [provider]  cloNIDine (CATAPRES) 0.1 MG tablet Take 1 tablet (0.1 mg total) by mouth 2 (two) times daily. 07/12/17  Yes Hackney, Otila Kluver A, FNP  Co-Enzyme Q-10 100 MG CAPS Take 100 mg by mouth daily.    Yes [provider]  cyclobenzaprine (FLEXERIL) 5 MG tablet Take 5 mg by mouth 3 (three) times daily as needed for muscle spasms.   Yes [provider]  docusate  sodium (COLACE) 50 MG capsule Take 100-200 mg by mouth 2 (two) times daily. 200mg  in the morning and 100mg  in the evening   Yes [provider]  Fluticasone-Salmeterol (WIXELA INHUB) 250-50 MCG/DOSE AEPB Inhale 1 puff into the lungs 2 (two) times daily.   Yes [provider]  gabapentin (NEURONTIN) 100 MG capsule Take 100 mg by mouth 3 (three) times daily.   Yes [provider]  Garlic 8756 MG CAPS Take 1,000 mg by mouth daily.    Yes [provider]  glimepiride (AMARYL) 2 MG tablet Take 2 mg by mouth daily with breakfast.   Yes [provider]  HYDROcodone-acetaminophen (NORCO) 10-325 MG tablet Take 1 tablet by mouth every 6 (six) hours as needed  for severe pain.    Yes [provider]  Insulin Detemir (LEVEMIR FLEXTOUCH) 100 UNIT/ML Pen Inject 23 Units into the skin at bedtime.  03/01/18  Yes [provider]  insulin lispro (HUMALOG) 100 UNIT/ML injection Inject 10-30 Units into the skin 3 (three) times daily before meals. 30 u in the morning, evening 30units 10-20 u at bedtime   Yes [provider]  levothyroxine (SYNTHROID, LEVOTHROID) 175 MCG tablet Take 175 mcg by mouth every evening.    Yes [provider]  linagliptin (TRADJENTA) 5 MG TABS tablet take 1 tablet by mouth once daily 01/03/18  Yes [provider]  lovastatin (MEVACOR) 20 MG tablet Take 20 mg by mouth at bedtime.   Yes [provider]  magnesium oxide (MAG-OX) 400 MG tablet Take 400 mg by mouth 2 (two) times daily.   Yes [provider]  metolazone (ZAROXOLYN) 5 MG tablet Take 5 mg by mouth as needed (for a weight gain of 2 pounds overnight or 5 pounds in a week; MAX OF 5 TABLETS AT A TIME).  04/18/17  Yes [provider]  Multiple Vitamins-Minerals (CENTRUM WOMEN) TABS Take 1 tablet by mouth daily.    Yes [provider]  Omega-3 Fatty Acids (FISH OIL) 1000 MG CPDR Take 1 capsule by mouth daily.   Yes  [provider]  pantoprazole (PROTONIX) 40 MG tablet Take 1 tablet (40 mg total) by mouth 2 (two) times daily before a meal. 10/12/18  Yes Fritzi Mandes, MD  potassium chloride SA (K-DUR,KLOR-CON) 20 MEQ tablet Take 2 tablets (40 mEq total) by mouth daily. Patient taking differently: Take 20 mEq by mouth 2 (two) times daily. Take 20 mEq twice daily and double dose when taking Metolazole 06/07/18  Yes Hackney, Tina A, FNP  senna (SENOKOT) 8.6 MG TABS tablet Take 8.6-17.2 mg by mouth 2 (two) times daily. 17.2mg  (2 tablets) in the morning and 8.6mg  in the evening   Yes [provider]  torsemide (DEMADEX) 20 MG tablet TAKE 2 TABLETS BY MOUTH TWICE A DAY Patient taking differently: Take 40 mg by mouth 2 (two) times daily.  09/07/18  Yes Hackney, Otila Kluver A, FNP  vitamin C (ASCORBIC ACID) 500 MG tablet Take 500 mg by mouth daily.   Yes [provider]  warfarin (COUMADIN) 5 MG tablet Take 2.5 mg by mouth daily at 6 PM.  04/22/17  Yes [provider]  doxycycline (VIBRAMYCIN) 100 MG capsule Take 100 mg by mouth 2 (two) times daily. 10/10/18 10/20/18  [provider]  predniSONE (DELTASONE) 20 MG tablet Take 20 mg by mouth as directed. 10/10/18   [provider]    Allergies Other; Latex; Exenatide; Garlic; Onion; Rosiglitazone; and Erythromycin  Family History  Problem Relation Age of Onset  . COPD Mother   . Heart disease Mother   . Anemia Mother   . Heart disease Father   . COPD Father   . Anemia Sister   . Diabetes Maternal Grandmother   . Hypertension Paternal Grandfather     Social History Social History   Tobacco Use  . Smoking status: Former Smoker    Packs/day: 3.00    Types: Cigarettes    Last attempt to quit: 1988    Years since quitting: 32.2  . Smokeless tobacco: Never Used  Substance Use Topics  . Alcohol use: No  . Drug use: No    Review of Systems Constitutional: No fever/chills Eyes: No visual changes. ENT: No sore  throat. Cardiovascular: Denies chest pain. Respiratory: +shortness of breath. Gastrointestinal: No abdominal pain.  No nausea, no vomiting.  No diarrhea.  No constipation. Genitourinary: Negative for dysuria. Musculoskeletal: Peripheral edema.  Negative for neck pain.  Negative for back pain. Integumentary: Negative for rash. Neurological: Negative for headaches, focal weakness or numbness.   ____________________________________________   PHYSICAL EXAM:  VITAL SIGNS: ED Triage Vitals  Enc Vitals Group     BP 10/12/18 2244 (!) 145/70     Pulse Rate 10/12/18 2244 77     Resp 10/12/18 2244 20     Temp 10/12/18 2244 98.3 F (36.8 C)     Temp Source 10/12/18 2244 Oral     SpO2 10/12/18 2244 (!) 79 %     Weight 10/12/18 2243 (!) 146.3 kg (322 lb 9.6 oz)     Height 10/12/18 2243 1.651 m (5\' 5" )     Head Circumference --      Peak Flow --      Pain Score 10/12/18 2243 3     Pain Loc --      Pain Edu? --      Excl. in [place]? --     Constitutional: Alert and oriented.  Appears somewhat chronically ill but is communicative and in no distress at this time. Eyes: Conjunctivae are normal.  Head: Atraumatic. Nose: No congestion/rhinnorhea. Mouth/Throat: Mucous membranes are moist. Neck: No stridor.  No meningeal signs.   Cardiovascular: Normal rate, regular rhythm. Good peripheral circulation. Grossly normal heart sounds. Respiratory: No increased work of breathing at this time, some mild tachypnea.  No accessory muscle usage currently.  Lung sounds are somewhat decreased likely secondary to body habitus but she has no wheezing and no rales or rhonchi. Gastrointestinal: Obese.  Soft and nontender. No distention.  Musculoskeletal: 1+ pitting edema in bilateral lower extremities.. No gross deformities of extremities. Neurologic:  Normal speech and language. No gross focal neurologic deficits are appreciated.  Skin:  Skin is warm, dry and intact. No rash noted. Psychiatric: Mood and affect  are normal. Speech and behavior are normal.  ____________________________________________   LABS (all labs ordered are listed, but only abnormal results are displayed)  Labs Reviewed  CBC WITH DIFFERENTIAL/PLATELET - Abnormal; Notable for the following components:      Result Value   WBC 15.1 (*)    MCHC 29.6 (*)    RDW 18.9 (*)    Neutro Abs 13.4 (*)    Abs Immature Granulocytes 0.13 (*)    All other components within normal limits  COMPREHENSIVE METABOLIC PANEL - Abnormal; Notable for the following components:   Chloride 94 (*)    CO2 34 (*)    Glucose, Bld 233 (*)    BUN 51 (*)    Creatinine, Ser 1.98 (*)    Calcium 8.7 (*)    Total Protein 8.7 (*)    AST 45 (*)    GFR calc non Af Amer 26 (*)    GFR calc Af Amer 30 (*)    All other components within normal limits  TROPONIN I - Abnormal; Notable for the following components:   Troponin I 0.05 (*)    All other components within normal limits  BLOOD GAS, ARTERIAL - Abnormal; Notable for the following components:   pCO2 arterial 61 (*)    pO2, Arterial 69 (*)    Bicarbonate 37.8 (*)    Acid-Base Excess 10.5 (*)    All other components within normal limits  BRAIN NATRIURETIC PEPTIDE -  Abnormal; Notable for the following components:   B Natriuretic Peptide 312.0 (*)    All other components within normal limits  PROTIME-INR - Abnormal; Notable for the following components:   Prothrombin Time 17.1 (*)    INR 1.4 (*)    All other components within normal limits  BRAIN NATRIURETIC PEPTIDE - Abnormal; Notable for the following components:   B Natriuretic Peptide 379.0 (*)    All other components within normal limits  APTT   ____________________________________________  EKG  ED ECG REPORT I, Hinda Kehr, the attending physician, personally viewed and interpreted this ECG.  Date: 10/12/2018 EKG Time: 22:49 Rate: 76 Rhythm: normal sinus rhythm QRS Axis: normal Intervals: normal ST/T Wave abnormalities: Non-specific  ST segment / T-wave changes, but no clear evidence of acute ischemia. Narrative Interpretation: no definitive evidence of acute ischemia; does not meet STEMI criteria.   ____________________________________________  RADIOLOGY I, Hinda Kehr, personally viewed and evaluated these images (plain radiographs) as part of my medical decision making, as well as reviewing the written report by the radiologist.  ED MD interpretation: Pulmonary vascular congestion without frank pulmonary edema.  No evidence of pneumothorax.  Official radiology report(s): Dg Chest 1 View  Result Date: 10/12/2018 CLINICAL DATA:  Initial evaluation for acute chest pain. EXAM: CHEST  1 VIEW COMPARISON:  Prior radiograph from 10/10/2018 FINDINGS: Cardiomegaly.  Mediastinal silhouette normal. Lungs normally inflated. Diffuse pulmonary vascular and interstitial congestion without frank pulmonary edema. No consolidative opacity. No pneumothorax. No pleural effusion. No acute osseous finding. IMPRESSION: 1. Cardiomegaly with mild diffuse pulmonary interstitial congestion without frank pulmonary edema. 2. No other acute cardiopulmonary abnormality. Electronically Signed   By: Jeannine Boga M.D.   On: 10/12/2018 23:20    ____________________________________________   PROCEDURES   Procedure(s) performed (including Critical Care):  Procedures   ____________________________________________   INITIAL IMPRESSION / MDM / [place] / ED COURSE  As part of my medical decision making, I reviewed the following data within the [place] notes reviewed and incorporated, Labs reviewed , EKG interpreted , Old chart reviewed, Radiograph reviewed , Discussed with admitting physician  and Notes from prior ED visits         Differential diagnosis includes, but is not limited to, CHF exacerbation, pneumothorax, endoscopy complications such as esophageal perforation leading to free air and  mediastinitis, ACS, pulmonary embolism.  The patient has not been off of her warfarin for very long and is very unlikely this represents pulmonary embolism.  She has not been diuresed for 2 days with a known history of heart failure and she has swelling and diffuse pulmonary vascular congestion.  Her ABG is notable for moderate elevation of her PCO2 but a significant hypoxia and I think that the hypercapnia is more likely due to her body habitus (restrictive pulmonary disease ) and difficulty with ventilation in general rather than a COPD exacerbation in particular given that she has no diagnosis of obstructive disease.  She has a mildly elevated BNP which I expect will most likely rise.  Her comprehensive metabolic panel is notable for a creatinine of 1.98 and a BUN of 51.  This is above her baseline but it looks like her baseline is somewhere in the 1.7 range.  Troponin is slightly elevated at 0.05 which is likely demand ischemia.  INR is subtherapeutic at this time.  CBC indicates a leukocytosis of 15 which is likely reactive in the setting of her recent treatments.  Her hemoglobin is 12  status post transfusion during the recent admission.  She needs diuresis at this time and I have ordered Lasix 80 mg IV given her regular doses of torsemide 40 mg p.o. twice daily.  There is no evidence of ischemia on her EKG.  The patient is hemodynamically stable with reassuring vital signs.  I discussed the case in person with Dr. Jannifer Franklin with hospitalist service who will admit.     ____________________________________________  FINAL CLINICAL IMPRESSION(S) / ED DIAGNOSES  Final diagnoses:  Acute respiratory failure with hypoxia and hypercapnia (HCC)  Acute on chronic congestive heart failure, unspecified heart failure type ([place])  Demand ischemia (HCC)  Elevated troponin I level     MEDICATIONS GIVEN DURING THIS VISIT:  Medications  gabapentin (NEURONTIN) capsule 100 mg (100 mg Oral Given 10/13/18 0008)   furosemide (LASIX) injection 80 mg (80 mg Intravenous Given 10/12/18 2357)     ED Discharge Orders    None       Note:  This document was prepared using Dragon voice recognition software and may include unintentional dictation errors.   Hinda Kehr, MD 10/13/18 [phone]

## 2018-10-13 NOTE — Progress Notes (Signed)
ANTICOAGULATION CONSULT NOTE - Initial Consult  Pharmacy Consult for warfarin dosing Indication: atrial fibrillation  Allergies  Allergen Reactions  . Other Other (See Comments)    ILOSONE- Caused GI distress also  . Latex Hives  . Exenatide Nausea Only and Other (See Comments)    [place]- Nausea and abdominal pain, also  . Garlic Other (See Comments)    Severe acid reflux  . Onion Other (See Comments)    Severe acid reflux  . Rosiglitazone Other (See Comments)    Avandia- Affected heart  . Erythromycin Nausea And Vomiting    GI DISTRESS    Patient Measurements: Height: 5\' 5"  (165.1 cm) Weight: (!) 322 lb 9.6 oz (146.3 kg) IBW/kg (Calculated) : 57 Heparin Dosing Weight:   Vital Signs: Temp: 98.4 F (36.9 C) (03/06 0747) Temp Source: Oral (03/06 0747) BP: 140/59 (03/06 0747) Pulse Rate: 63 (03/06 0839)  Labs: Recent Labs    10/10/18 2053 10/11/18 1056 10/12/18 2350 10/13/18 0353  HGB 5.5* 11.3* 12.0 10.9*  HCT 18.9* 37.0 40.5 36.7  PLT 117* 212 228 196  APTT  --   --  24  --   LABPROT  --  18.4* 17.1*  --   INR  --  1.6* 1.4*  --   CREATININE 2.26* 1.99* 1.98* 2.02*  TROPONINI 0.06*  --  0.05*  --     Estimated Creatinine Clearance: 41.7 mL/min (A) (by C-G formula based on SCr of 2.02 mg/dL (H)).   Medical History: Past Medical History:  Diagnosis Date  . Anemia   . Anxiety   . CHF (congestive heart failure) ([place])   . Chronic kidney disease    INSUFFICIENCY  . Complication of anesthesia    had to be intubated during cataract surgery   " I could not breath "  . COPD (chronic obstructive pulmonary disease) ([place])   . Diabetes mellitus without complication ([place])   . Dyspnea    DOE  . Dysrhythmia    A FIB  . Edema   . Fracture of distal femur ([place]) 11/2017   left   . GERD (gastroesophageal reflux disease)   . History of kidney stones   . Hypertension   . Hypothyroidism    ABLATION  . Iron deficiency anemia 03/31/2017  . Neuropathy   . Orthopnea    . Sleep apnea    CPAP  . Vertigo   . Wheezing     Medications:  Home warfarin dose 2.5 mg daily.   Assessment: Pharmacy consulted for warfarin dosing and monitoring for 63 yo female with PMH of A. Fib. INR subtherapeutic on admission. Patient takes amiodarone 200mg  daily.   Goal of Therapy:  INR 2-3   Plan:  3/6 Will order Warfarin 3mg  x 1 dose tonight. Will plan to recheck INR with AM labs daily until therapeutic.   Pharmacy will continue to order warfarin dose daily based on INRs.  Will check CBC at least every 3 days per protocol.    Pernell Dupre, PharmD, BCPS Clinical Pharmacist 10/13/2018 9:43 AM

## 2018-10-13 NOTE — Progress Notes (Signed)
Patient briefly seen and examined.  Patient here with CHF exacerbation.  Echocardiogram pending.  Continue current management.

## 2018-10-13 NOTE — H&P (Signed)
[facility] [place] NAME: Andrea Rivers    MR#:  [id number]  DATE OF BIRTH:  [birth year]  DATE OF ADMISSION:  10/12/2018  PRIMARY CARE PHYSICIAN: Sofie Hartigan, MD   REQUESTING/REFERRING PHYSICIAN: Karma Greaser, MD  CHIEF COMPLAINT:   Chief Complaint  Patient presents with  . Shortness of Breath    HISTORY OF PRESENT ILLNESS:  Andrea Rivers  is a 63 y.o. female who presents with chief complaint as above.  She was just admitted to our facility for GI bleed and discharged home after work-up.  She presents to the ED tonight due to increased shortness of breath and edema.  Since that she missed a few doses of her diuretic, and now has significant vascular congestion on x-ray in the ED.  Hospitalist were called for admission  PAST MEDICAL HISTORY:   Past Medical History:  Diagnosis Date  . Anemia   . Anxiety   . CHF (congestive heart failure) ([place])   . Chronic kidney disease    INSUFFICIENCY  . Complication of anesthesia    had to be intubated during cataract surgery   " I could not breath "  . COPD (chronic obstructive pulmonary disease) ([place])   . Diabetes mellitus without complication ([place])   . Dyspnea    DOE  . Dysrhythmia    A FIB  . Edema   . Fracture of distal femur ([place]) 11/2017   left   . GERD (gastroesophageal reflux disease)   . History of kidney stones   . Hypertension   . Hypothyroidism    ABLATION  . Iron deficiency anemia 03/31/2017  . Neuropathy   . Orthopnea   . Sleep apnea    CPAP  . Vertigo   . Wheezing      PAST SURGICAL HISTORY:   Past Surgical History:  Procedure Laterality Date  . CATARACT EXTRACTION W/PHACO Left 01/26/2017   Procedure: CATARACT EXTRACTION PHACO AND INTRAOCULAR LENS PLACEMENT (IOC);  Surgeon: Estill Cotta, MD;  Location: [facility];  Service: Ophthalmology;  Laterality: Left;  [place]   1:02.2 AP     23.7 CDE   28.92 fluid pack lot# [id number] H exp.05/08/2018  .  CHOLECYSTECTOMY    . ESOPHAGOGASTRODUODENOSCOPY N/A 10/12/2018   Procedure: ESOPHAGOGASTRODUODENOSCOPY (EGD);  Surgeon: Toledo, Benay Pike, MD;  Location: [facility];  Service: Gastroenterology;  Laterality: N/A;  . ORIF FEMUR FRACTURE Left 11/17/2017   Procedure: OPEN REDUCTION INTERNAL FIXATION (ORIF) DISTAL FEMUR FRACTURE;  Surgeon: Shona Needles, MD;  Location: [place];  Service: Orthopedics;  Laterality: Left;  . TONSILLECTOMY       SOCIAL HISTORY:   Social History   Tobacco Use  . Smoking status: Former Smoker    Packs/day: 3.00    Types: Cigarettes    Last attempt to quit: 1988    Years since quitting: 32.2  . Smokeless tobacco: Never Used  Substance Use Topics  . Alcohol use: No     FAMILY HISTORY:   Family History  Problem Relation Age of Onset  . COPD Mother   . Heart disease Mother   . Anemia Mother   . Heart disease Father   . COPD Father   . Anemia Sister   . Diabetes Maternal Grandmother   . Hypertension Paternal Grandfather      DRUG ALLERGIES:   Allergies  Allergen Reactions  . Other Other (See Comments)    ILOSONE- Caused GI distress also  . Latex Hives  .  Exenatide Nausea Only and Other (See Comments)    Byetta- Nausea and abdominal pain, also  . Garlic Other (See Comments)    Severe acid reflux  . Onion Other (See Comments)    Severe acid reflux  . Rosiglitazone Other (See Comments)    Avandia- Affected heart  . Erythromycin Nausea And Vomiting    GI DISTRESS    MEDICATIONS AT HOME:   Prior to Admission medications   Medication Sig Start Date End Date Taking? Authorizing Provider  acetaminophen (TYLENOL) 500 MG tablet Take 1,000 mg by mouth 2 (two) times daily as needed.    [provider]  albuterol (PROAIR HFA) 108 (90 Base) MCG/ACT inhaler inhale 2 puffs by mouth INTO LUNGS every 4 to 6 hours if needed for wheezing or shortness of breath 03/14/17   [provider]  albuterol (PROVENTIL) (2.5 MG/3ML) 0.083%  nebulizer solution Inhale 3 mLs (2.5 mg total) into the lungs every 4 (four) hours as needed for wheezing or shortness of breath. 11/21/17   Sheikh, Georgina Quint Latif, DO  Amino Acids-Protein Hydrolys (FEEDING SUPPLEMENT, PRO-STAT SUGAR FREE 64,) LIQD Take 30 mLs by mouth 3 (three) times daily with meals.    [provider]  amiodarone (PACERONE) 200 MG tablet Take 200 mg by mouth daily. 05/10/17   [provider]  azelastine (ASTELIN) 0.1 % nasal spray Place 2 sprays into both nostrils 2 (two) times daily as needed.  10/24/17   [provider]  Biotin 10 MG CAPS Take 1 capsule by mouth daily.     [provider]  Azucena Freed Serrata (BOSWELLIA PO) Take by mouth.    [provider]  Calcium Carbonate-Vitamin D3 (CALCIUM 600-D) 600-400 MG-UNIT TABS Take 1 tablet by mouth daily.    [provider]  Cholecalciferol (D3 HIGH POTENCY) 125 MCG (5000 UT) capsule Take 5,000 Units by mouth daily.     [provider]  citalopram (CELEXA) 20 MG tablet Take 40 mg by mouth daily.     [provider]  clonazePAM (KLONOPIN) 1 MG tablet Take 1 tablet by mouth 2 (two) times daily as needed.  05/17/18   [provider]  cloNIDine (CATAPRES) 0.1 MG tablet Take 1 tablet (0.1 mg total) by mouth 2 (two) times daily. 07/12/17   Alisa Graff, FNP  Co-Enzyme Q-10 100 MG CAPS Take 100 mg by mouth daily.     [provider]  cyclobenzaprine (FLEXERIL) 5 MG tablet Take 5 mg by mouth 3 (three) times daily as needed for muscle spasms.    [provider]  docusate sodium (COLACE) 50 MG capsule Take 100 mg by mouth 2 (two) times daily.    [provider]  doxycycline (VIBRAMYCIN) 100 MG capsule Take 100 mg by mouth 2 (two) times daily. 10/10/18 10/20/18  [provider]  Fluticasone-Salmeterol (WIXELA INHUB) 250-50 MCG/DOSE AEPB Inhale 1 puff into the lungs 2 (two) times daily.    [provider]  gabapentin (NEURONTIN)  100 MG capsule Take 100 mg by mouth 3 (three) times daily.  09/05/17 09/05/18  [provider]  Garlic 9357 MG CAPS Take 1,000 mg by mouth daily.     [provider]  glimepiride (AMARYL) 2 MG tablet Take 2 mg by mouth daily with breakfast.    [provider]  HYDROcodone-acetaminophen (NORCO) 10-325 MG tablet Take 1 tablet by mouth every 6 (six) hours as needed.    [provider]  Insulin Detemir (LEVEMIR FLEXTOUCH) 100 UNIT/ML Pen  Inject 23 Units into the skin at bedtime.  03/01/18   [provider]  insulin lispro (HUMALOG) 100 UNIT/ML injection Inject into the skin 3 (three) times daily before meals.    [provider]  levothyroxine (SYNTHROID, LEVOTHROID) 175 MCG tablet Take 175 mcg by mouth every evening.     [provider]  linagliptin (TRADJENTA) 5 MG TABS tablet take 1 tablet by mouth once daily 01/03/18   [provider]  loratadine (CLARITIN) 10 MG tablet Take 1 tablet (10 mg total) by mouth daily. 11/22/17   Sheikh, Omair Latif, DO  LORazepam (ATIVAN) 1 MG tablet Take 1 tablet (1 mg total) by mouth 2 (two) times daily. 02/06/18 02/06/19  Earleen Newport, MD  lovastatin (MEVACOR) 20 MG tablet Take 20 mg by mouth at bedtime.    [provider]  magnesium oxide (MAG-OX) 400 MG tablet Take 400 mg by mouth 2 (two) times daily.    [provider]  meclizine (ANTIVERT) 12.5 MG tablet Take 1 tablet (12.5 mg total) by mouth 3 (three) times daily as needed for dizziness. 03/03/17   Gladstone Lighter, MD  metolazone (ZAROXOLYN) 5 MG tablet Take 5 mg by mouth as needed (for a weight gain of 2 pounds overnight or 5 pounds in a week; MAX OF 5 TABLETS AT A TIME).  04/18/17   [provider]  Multiple Vitamins-Minerals (CENTRUM WOMEN) TABS Take 1 tablet by mouth daily.     [provider]  Omega-3 Fatty Acids (FISH OIL) 1000 MG CPDR Take 1 capsule by mouth daily.    [provider]   pantoprazole (PROTONIX) 40 MG tablet Take 1 tablet (40 mg total) by mouth 2 (two) times daily before a meal. 10/12/18   Fritzi Mandes, MD  polyethylene glycol (MIRALAX / GLYCOLAX) packet Take 17 g by mouth 2 (two) times daily. 11/21/17   Raiford Noble Latif, DO  potassium chloride SA (K-DUR,KLOR-CON) 20 MEQ tablet Take 2 tablets (40 mEq total) by mouth daily. 06/07/18   Alisa Graff, FNP  predniSONE (DELTASONE) 20 MG tablet Take 20 mg by mouth as directed. 10/10/18   [provider]  senna (SENOKOT) 8.6 MG TABS tablet Take 2 tablets by mouth 2 (two) times daily.    [provider]  senna-docusate (SENOKOT-S) 8.6-50 MG tablet Take 1 tablet by mouth at bedtime as needed for mild constipation. 11/21/17   Raiford Noble Latif, DO  sodium phosphate (FLEET) 7-19 GM/118ML ENEM Place 133 mLs (1 enema total) rectally once as needed for severe constipation. 11/21/17   Raiford Noble Latif, DO  torsemide (DEMADEX) 20 MG tablet TAKE 2 TABLETS BY MOUTH TWICE A DAY 09/07/18   Darylene Price A, FNP  vitamin C (ASCORBIC ACID) 500 MG tablet Take 500 mg by mouth daily.    [provider]  warfarin (COUMADIN) 5 MG tablet Take 2.5 mg by mouth daily at 6 PM.  04/22/17   [provider]  zinc sulfate 220 (50 Zn) MG capsule Take 220 mg by mouth daily.    [provider]    REVIEW OF SYSTEMS:  Review of Systems  Constitutional: Negative for chills, fever, malaise/fatigue and weight loss.  HENT: Negative for ear pain, hearing loss and tinnitus.   Eyes: Negative for blurred vision, double vision, pain and redness.  Respiratory: Positive for cough and shortness of breath. Negative for hemoptysis.   Cardiovascular: Positive for leg swelling. Negative for chest pain, palpitations and orthopnea.  Gastrointestinal: Negative for abdominal  pain, constipation, diarrhea, nausea and vomiting.  Genitourinary: Negative for dysuria, frequency and hematuria.  Musculoskeletal: Negative for back  pain, joint pain and neck pain.  Skin:       No acne, rash, or lesions  Neurological: Negative for dizziness, tremors, focal weakness and weakness.  Endo/Heme/Allergies: Negative for polydipsia. Does not bruise/bleed easily.  Psychiatric/Behavioral: Negative for depression. The patient is not nervous/anxious and does not have insomnia.      VITAL SIGNS:   Vitals:   10/12/18 2243 10/12/18 2244 10/12/18 2344  BP:  (!) 145/70 (!) 136/102  Pulse:  77 75  Resp:  20 (!) 22  Temp:  98.3 F (36.8 C)   TempSrc:  Oral   SpO2:  (!) 79% 96%  Weight: (!) 146.3 kg    Height: 5\' 5"  (1.651 m)     Wt Readings from Last 3 Encounters:  10/12/18 (!) 146.3 kg  10/10/18 (!) 148 kg  10/02/18 (!) 149.7 kg    PHYSICAL EXAMINATION:  Physical Exam  Vitals reviewed. Constitutional: She is oriented to person, place, and time. She appears well-developed and well-nourished. No distress.  HENT:  Head: Normocephalic and atraumatic.  Mouth/Throat: Oropharynx is clear and moist.  Eyes: Pupils are equal, round, and reactive to light. Conjunctivae and EOM are normal. No scleral icterus.  Neck: Normal range of motion. Neck supple. No JVD present. No thyromegaly present.  Cardiovascular: Normal rate, regular rhythm and intact distal pulses. Exam reveals no gallop and no friction rub.  No murmur heard. Respiratory: She is in respiratory distress (Mild). She has no wheezes. She has rales.  GI: Soft. Bowel sounds are normal. She exhibits no distension. There is no abdominal tenderness.  Musculoskeletal: Normal range of motion.        General: No edema.     Comments: No arthritis, no gout  Lymphadenopathy:    She has no cervical adenopathy.  Neurological: She is alert and oriented to person, place, and time. No cranial nerve deficit.  No dysarthria, no aphasia  Skin: Skin is warm and dry. No rash noted. No erythema.  Psychiatric: She has a normal mood and affect. Her behavior is normal. Judgment and thought  content normal.    LABORATORY PANEL:   CBC Recent Labs  Lab 10/12/18 2350  WBC 15.1*  HGB 12.0  HCT 40.5  PLT 228   ------------------------------------------------------------------------------------------------------------------  Chemistries  Recent Labs  Lab 10/11/18 1056 10/12/18 0944  NA 140  --   K 2.8* 3.2*  CL 91*  --   CO2 37*  --   GLUCOSE 162*  --   BUN 50*  --   CREATININE 1.99*  --   CALCIUM 9.1  --   MG 2.6*  --    ------------------------------------------------------------------------------------------------------------------  Cardiac Enzymes Recent Labs  Lab 10/10/18 2053  TROPONINI 0.06*   ------------------------------------------------------------------------------------------------------------------  RADIOLOGY:  Dg Chest 1 View  Result Date: 10/12/2018 CLINICAL DATA:  Initial evaluation for acute chest pain. EXAM: CHEST  1 VIEW COMPARISON:  Prior radiograph from 10/10/2018 FINDINGS: Cardiomegaly.  Mediastinal silhouette normal. Lungs normally inflated. Diffuse pulmonary vascular and interstitial congestion without frank pulmonary edema. No consolidative opacity. No pneumothorax. No pleural effusion. No acute osseous finding. IMPRESSION: 1. Cardiomegaly with mild diffuse pulmonary interstitial congestion without frank pulmonary edema. 2. No other acute cardiopulmonary abnormality. Electronically Signed   By: Jeannine Boga M.D.   On: 10/12/2018 23:20    EKG:   Orders placed or performed during the hospital encounter of 10/12/18  .  ED EKG  . ED EKG    IMPRESSION AND PLAN:  Principal Problem:   Acute on chronic diastolic CHF (congestive heart failure) ([place]) -patient was given IV Lasix in the ED.  We can repeat this dose early in the morning if needed.  We will resume her home dose of torsemide.  Will get echocardiogram. Active Problems:   HTN (hypertension) -home dose antihypertensives   Diabetes ([place]) -sliding scale insulin coverage    Atrial fibrillation ([place]) -continue home meds including anticoagulation   Hypothyroidism -home dose thyroid replacement  Chart review performed and case discussed with ED provider. Labs, imaging and/or ECG reviewed by provider and discussed with patient/family. Management plans discussed with the patient and/or family.  DVT PROPHYLAXIS: Systemic anticoagulation  GI PROPHYLAXIS:  None  ADMISSION STATUS: Observation  CODE STATUS: Full Code Status History    Date Active Date Inactive Code Status Order ID Comments User Context   10/11/2018 0837 10/12/2018 2234 Full Code [id number]  Lance Coon, MD ED   11/14/2017 1816 11/21/2017 2158 Full Code [id number]  Elease Hashimoto Inpatient   02/22/2017 1003 03/03/2017 1712 Full Code [id number]  Dustin Flock, MD Inpatient   01/26/2017 1636 01/28/2017 1234 Full Code [id number]  Baxter Hire, MD Inpatient      TOTAL TIME TAKING CARE OF THIS PATIENT: 40 minutes.   Ethlyn Daniels 10/13/2018, 12:33 AM  [facility]  Office  [phone]  CC: Primary care physician; Sofie Hartigan, MD  Note:  This document was prepared using Dragon voice recognition software and may include unintentional dictation errors.

## 2018-10-13 NOTE — Progress Notes (Signed)
*  PRELIMINARY RESULTS* Echocardiogram 2D Echocardiogram has been performed.  Wallie Char Conny Moening 10/13/2018, 11:50 AM

## 2018-10-13 NOTE — ED Notes (Signed)
ED TO INPATIENT HANDOFF REPORT  ED Nurse Name and Phone #: Apolonio Schneiders [phone]  S Name/Age/Gender Andrea Rivers 63 y.o. female Room/Bed: ED24A/ED24A  Code Status   Code Status: Prior  Home/SNF/Other Home Patient oriented to: self, place, time and situation Is this baseline? Yes   Triage Complete: Triage complete  Chief Complaint shortness of breath/EMS  Triage Note Pt to triage via w/c with no distress noted, brought in by EMS for c/o [facility]; pt reports d/c at 6pm ( was admitted for bronchitis and currently taking antibiotics but having persistent SHOB with upper chest tightness; had endoscopy today for hgb 5 and received 1unit PRBCs; st began having SHOB after proced and received O2 and neb; nailbeds cyanotic: O2 sat 79% on ra; pt placed on O2 at 5l/min via [place] to bring sat to 93%; charge nurse called for exam room   Allergies Allergies  Allergen Reactions  . Other Other (See Comments)    ILOSONE- Caused GI distress also  . Latex Hives  . Exenatide Nausea Only and Other (See Comments)    Byetta- Nausea and abdominal pain, also  . Garlic Other (See Comments)    Severe acid reflux  . Onion Other (See Comments)    Severe acid reflux  . Rosiglitazone Other (See Comments)    Avandia- Affected heart  . Erythromycin Nausea And Vomiting    GI DISTRESS    Level of Care/Admitting Diagnosis ED Disposition    ED Disposition Condition [facility] Area: [place] [[id number]]  Level of Care: Telemetry [5]  Diagnosis: Acute on chronic diastolic CHF (congestive heart failure) [facility]) [706237]  Admitting Physician: Lance Coon [[id number]]  Attending Physician: Lance Coon [phone]  Bed request comments: 2a  PT Class (Do Not Modify): Observation [104]  PT Acc Code (Do Not Modify): Observation [10022]       B Medical/Surgery History Past Medical History:  Diagnosis Date  . Anemia   . Anxiety   . CHF (congestive heart failure) ([place])   .  Chronic kidney disease    INSUFFICIENCY  . Complication of anesthesia    had to be intubated during cataract surgery   " I could not breath "  . COPD (chronic obstructive pulmonary disease) ([place])   . Diabetes mellitus without complication ([place])   . Dyspnea    DOE  . Dysrhythmia    A FIB  . Edema   . Fracture of distal femur ([place]) 11/2017   left   . GERD (gastroesophageal reflux disease)   . History of kidney stones   . Hypertension   . Hypothyroidism    ABLATION  . Iron deficiency anemia 03/31/2017  . Neuropathy   . Orthopnea   . Sleep apnea    CPAP  . Vertigo   . Wheezing    Past Surgical History:  Procedure Laterality Date  . CATARACT EXTRACTION W/PHACO Left 01/26/2017   Procedure: CATARACT EXTRACTION PHACO AND INTRAOCULAR LENS PLACEMENT (IOC);  Surgeon: Estill Cotta, MD;  Location: [facility];  Service: Ophthalmology;  Laterality: Left;  [place]   1:02.2 AP     23.7 CDE   28.92 fluid pack lot# [id number] H exp.05/08/2018  . CHOLECYSTECTOMY    . ESOPHAGOGASTRODUODENOSCOPY N/A 10/12/2018   Procedure: ESOPHAGOGASTRODUODENOSCOPY (EGD);  Surgeon: Toledo, Benay Pike, MD;  Location: [facility];  Service: Gastroenterology;  Laterality: N/A;  . ORIF FEMUR FRACTURE Left 11/17/2017   Procedure: OPEN REDUCTION INTERNAL FIXATION (ORIF) DISTAL FEMUR FRACTURE;  Surgeon: Shona Needles,  MD;  Location: [place];  Service: Orthopedics;  Laterality: Left;  . TONSILLECTOMY       A IV Location/Drains/Wounds Patient Lines/Drains/Airways Status   Active Line/Drains/Airways    Name:   Placement date:   Placement time:   Site:   Days:   Peripheral IV 10/13/18 Right Antecubital   10/13/18    0016    Antecubital   less than 1   Peripheral IV 10/13/18 Right Forearm   10/13/18    0016    Forearm   less than 1   Airway   10/12/18    1028     1   Incision (Closed) 01/26/17 Eye Left   01/26/17    1002     625   Incision (Closed) 11/17/17 Leg Left   11/17/17    1130     330          Intake/Output  Last 24 hours No intake or output data in the 24 hours ending 10/13/18 0043  Labs/Imaging Results for orders placed or performed during the hospital encounter of 10/12/18 (from the past 48 hour(s))  Brain natriuretic peptide     Status: Abnormal   Collection Time: 10/12/18  9:51 AM  Result Value Ref Range   B Natriuretic Peptide 312.0 (H) 0.0 - 100.0 pg/mL    Comment: Performed at [facility], [address] [place], [place] [address]  Blood gas, arterial     Status: Abnormal   Collection Time: 10/12/18 11:03 PM  Result Value Ref Range   FIO2 0.28    Delivery systems NASAL CANNULA    pH, Arterial 7.40 7.350 - 7.450   pCO2 arterial 61 (H) 32.0 - 48.0 mmHg   pO2, Arterial 69 (L) 83.0 - 108.0 mmHg   Bicarbonate 37.8 (H) 20.0 - 28.0 mmol/L   Acid-Base Excess 10.5 (H) 0.0 - 2.0 mmol/L   O2 Saturation 93.5 %   Patient temperature 37.0    Collection site LEFT RADIAL    Sample type ARTERIAL DRAW    Allens test (pass/fail) PASS PASS    Comment: Performed at [facility], [address] [place], [place] [address]  CBC with Differential     Status: Abnormal   Collection Time: 10/12/18 11:50 PM  Result Value Ref Range   WBC 15.1 (H) 4.0 - 10.5 K/uL   RBC 4.53 3.87 - 5.11 MIL/uL   Hemoglobin 12.0 12.0 - 15.0 g/dL   HCT 40.5 36.0 - 46.0 %   MCV 89.4 80.0 - 100.0 fL   MCH 26.5 26.0 - 34.0 pg   MCHC 29.6 (L) 30.0 - 36.0 g/dL   RDW 18.9 (H) 11.5 - 15.5 %   Platelets 228 150 - 400 K/uL   nRBC 0.0 0.0 - 0.2 %   Neutrophils Relative % 89 %   Neutro Abs 13.4 (H) 1.7 - 7.7 K/uL   Lymphocytes Relative 5 %   Lymphs Abs 0.7 0.7 - 4.0 K/uL   Monocytes Relative 5 %   Monocytes Absolute 0.7 0.1 - 1.0 K/uL   Eosinophils Relative 0 %   Eosinophils Absolute 0.0 0.0 - 0.5 K/uL   Basophils Relative 0 %   Basophils Absolute 0.0 0.0 - 0.1 K/uL   Immature Granulocytes 1 %   Abs Immature Granulocytes 0.13 (H) 0.00 - 0.07 K/uL    Comment: Performed at [facility], [address] [place],  [address]  Comprehensive metabolic panel     Status: Abnormal  Collection Time: 10/12/18 11:50 PM  Result Value Ref Range   Sodium 141 135 - 145 mmol/L   Potassium 3.8 3.5 - 5.1 mmol/L    Comment: HEMOLYSIS AT THIS LEVEL MAY AFFECT RESULT   Chloride 94 (L) 98 - 111 mmol/L   CO2 34 (H) 22 - 32 mmol/L   Glucose, Bld 233 (H) 70 - 99 mg/dL   BUN 51 (H) 8 - 23 mg/dL   Creatinine, Ser 1.98 (H) 0.44 - 1.00 mg/dL   Calcium 8.7 (L) 8.9 - 10.3 mg/dL   Total Protein 8.7 (H) 6.5 - 8.1 g/dL   Albumin 3.8 3.5 - 5.0 g/dL   AST 45 (H) 15 - 41 U/L   ALT 22 0 - 44 U/L   Alkaline Phosphatase 81 38 - 126 U/L   Total Bilirubin 0.9 0.3 - 1.2 mg/dL   GFR calc non Af Amer 26 (L) >60 mL/min   GFR calc Af Amer 30 (L) >60 mL/min   Anion gap 13 5 - 15    Comment: Performed at [facility], [address] [place], [place] [address]  Troponin I - ONCE - STAT     Status: Abnormal   Collection Time: 10/12/18 11:50 PM  Result Value Ref Range   Troponin I 0.05 (HH) <0.03 ng/mL    Comment: CRITICAL RESULT CALLED TO, READ BACK BY AND VERIFIED WITH RACHAEL Crysta Gulick AT 0029 10/13/2018 SMA Performed at [facility], [address] [place], [place] [address]   Protime-INR     Status: Abnormal   Collection Time: 10/12/18 11:50 PM  Result Value Ref Range   Prothrombin Time 17.1 (H) 11.4 - 15.2 seconds   INR 1.4 (H) 0.8 - 1.2    Comment: (NOTE) INR goal varies based on device and disease states. Performed at [facility], [address] [place], [place] [address]   Brain natriuretic peptide     Status: Abnormal   Collection Time: 10/12/18 11:52 PM  Result Value Ref Range   B Natriuretic Peptide 379.0 (H) 0.0 - 100.0 pg/mL    Comment: Performed at [facility], [address], [place], [place] [address]   Dg Chest 1 View  Result Date: 10/12/2018 CLINICAL DATA:  Initial evaluation for acute chest pain. EXAM: CHEST  1 VIEW COMPARISON:  Prior  radiograph from 10/10/2018 FINDINGS: Cardiomegaly.  Mediastinal silhouette normal. Lungs normally inflated. Diffuse pulmonary vascular and interstitial congestion without frank pulmonary edema. No consolidative opacity. No pneumothorax. No pleural effusion. No acute osseous finding. IMPRESSION: 1. Cardiomegaly with mild diffuse pulmonary interstitial congestion without frank pulmonary edema. 2. No other acute cardiopulmonary abnormality. Electronically Signed   By: Jeannine Boga M.D.   On: 10/12/2018 23:20    Pending Labs Unresulted Labs (From admission, onward)    Start     Ordered   10/12/18 2341  APTT  Add-on,   AD     10/12/18 2340   Signed and Held  Basic metabolic panel  Tomorrow morning,   R     Signed and Held   Signed and Held  CBC  Tomorrow morning,   R     Signed and Held          Vitals/Pain Today's Vitals   10/12/18 2344 10/12/18 2345 10/13/18 0000 10/13/18 0032  BP: (!) 136/102 (!) 154/71 130/63   Pulse: 75 67 66 (!) 36  Resp: (!) 22 18 13 20   Temp:      TempSrc:  SpO2: 96% 93% 100% 92%  Weight:      Height:      PainSc: 10-Worst pain ever       Isolation Precautions No active isolations  Medications Medications  gabapentin (NEURONTIN) capsule 100 mg (100 mg Oral Given 10/13/18 0008)  furosemide (LASIX) injection 80 mg (80 mg Intravenous Given 10/12/18 2357)    Mobility walks with device Low fall risk   Focused Assessments Pulmonary Assessment Handoff:  Lung sounds: Bilateral Breath Sounds: Clear, Diminished O2 Device: Nasal Cannula O2 Flow Rate (L/min): 4 L/min      R Recommendations: See Admitting Provider Note  Report given to:   Additional Notes: Pt recently transfused and is s/p endoscopy. Pt has pmh of a-fib and CHF. Pt was profoundly dyspneic @ rest upon arrival via EMS. Pt normally does not wear O2 @ home. Pt is supposed to be using a CPaP @ night but refuses to secondary to acquired infection after using CPaP.

## 2018-10-13 NOTE — Care Management Obs Status (Signed)
[place] NOTIFICATION   Patient Details  Name: Andrea Rivers MRN: [id number] Date of Birth: 1956-01-24   Medicare Observation Status Notification Given:  Yes    Elza Rafter, RN 10/13/2018, 4:35 PM

## 2018-10-13 NOTE — Plan of Care (Signed)
Nutrition Education Note  RD consulted for nutrition education regarding a Heart Healthy diet. Patient is disabled and reports eating most all of her meals out and relies on coupons for local fast food places to guide her choices. RD discussed ways to make meal deals more heart healthy (choosing fruit as a side over fries, water instead of soda, grilled over fried)   Lipid Panel     Component Value Date/Time   TRIG 68 02/22/2017 1706    RD provided "Heart Healthy Nutrition Therapy" handout from the [employer]. Reviewed patient's dietary recall. Provided examples on ways to decrease sodium and fat intake in diet. Discouraged intake of processed foods and use of salt shaker. Encouraged fresh fruits and vegetables as well as whole grain sources of carbohydrates to maximize fiber intake. Teach back method used.  Expect poor compliance.  Body mass index is 53.68 kg/m. Pt meets criteria for morbid obesity based on current BMI.  Current diet order is HH, patient is consuming approximately 85% of meals at this time. Labs and medications reviewed. No further nutrition interventions warranted at this time. RD contact information provided. If additional nutrition issues arise, please re-consult RD.  Lajuan Lines, RD, LDN  After Hours/Weekend Pager: [phone]

## 2018-10-14 LAB — GLUCOSE, CAPILLARY
GLUCOSE-CAPILLARY: 149 mg/dL — AB (ref 70–99)
Glucose-Capillary: 142 mg/dL — ABNORMAL HIGH (ref 70–99)
Glucose-Capillary: 183 mg/dL — ABNORMAL HIGH (ref 70–99)
Glucose-Capillary: 151 mg/dL — ABNORMAL HIGH (ref 70–99)

## 2018-10-14 LAB — BASIC METABOLIC PANEL
ANION GAP: 10 (ref 5–15)
BUN: 58 mg/dL — ABNORMAL HIGH (ref 8–23)
CALCIUM: 8.4 mg/dL — AB (ref 8.9–10.3)
CO2: 36 mmol/L — ABNORMAL HIGH (ref 22–32)
Chloride: 95 mmol/L — ABNORMAL LOW (ref 98–111)
Creatinine, Ser: 2.17 mg/dL — ABNORMAL HIGH (ref 0.44–1.00)
GFR calc Af Amer: 27 mL/min — ABNORMAL LOW (ref >60)
GFR calc non Af Amer: 23 mL/min — ABNORMAL LOW (ref >60)
Glucose, Bld: 159 mg/dL — ABNORMAL HIGH (ref 70–99)
Potassium: 3.5 mmol/L (ref 3.5–5.1)
Sodium: 141 mmol/L (ref 135–145)

## 2018-10-14 LAB — PROTIME-INR
INR: 1.7 — ABNORMAL HIGH (ref 0.8–1.2)
Prothrombin Time: 19.3 seconds — ABNORMAL HIGH (ref 11.4–15.2)

## 2018-10-14 MED ORDER — FUROSEMIDE 10 MG/ML IJ SOLN
60.0000 mg | Freq: Two times a day (BID) | INTRAMUSCULAR | Status: DC
  Administered 2018-10-14 – 2018-10-16 (×5): 60 mg via INTRAVENOUS
  Filled 2018-10-14 (×5): qty 6

## 2018-10-14 MED ORDER — WARFARIN SODIUM 3 MG PO TABS
3.0000 mg | ORAL_TABLET | Freq: Once | ORAL | Status: AC
  Administered 2018-10-14: 3 mg via ORAL
  Filled 2018-10-14: qty 1

## 2018-10-14 MED ORDER — SODIUM CHLORIDE 0.9% FLUSH
10.0000 mL | Freq: Two times a day (BID) | INTRAVENOUS | Status: DC
  Administered 2018-10-14 – 2018-10-16 (×4): 10 mL via INTRAVENOUS

## 2018-10-14 NOTE — Progress Notes (Signed)
SATURATION QUALIFICATIONS: (This note is used to comply with regulatory documentation for home oxygen)  Patient Saturations on Room Air at Rest = 76%  Patient Saturations on Room Air while Ambulating = 74%  Patient Saturations on 2 Liters of oxygen while Ambulating = 92%  Please briefly explain why patient needs home oxygen:

## 2018-10-14 NOTE — Progress Notes (Signed)
[place] at [place] NAME: Andrea Rivers    MR#:  [id number]  DATE OF BIRTH:  09-29-1955  SUBJECTIVE:   Patient admitted to the hospital secondary to shortness of breath and noted to be in acute on chronic respiratory failure with hypoxia secondary to CHF.  Remains quite hypoxic on ambulation.  No other acute events overnight.  REVIEW OF SYSTEMS:    Review of Systems  Constitutional: Negative for chills and fever.  HENT: Negative for congestion and tinnitus.   Eyes: Negative for blurred vision and double vision.  Respiratory: Positive for shortness of breath. Negative for cough and wheezing.   Cardiovascular: Negative for chest pain, orthopnea and PND.  Gastrointestinal: Negative for abdominal pain, diarrhea, nausea and vomiting.  Genitourinary: Negative for dysuria and hematuria.  Neurological: Negative for dizziness, sensory change and focal weakness.  All other systems reviewed and are negative.   Nutrition: Heart healthy/Carb control Tolerating Diet: Yes Tolerating PT: Await Eval.   DRUG ALLERGIES:   Allergies  Allergen Reactions  . Other Other (See Comments)    ILOSONE- Caused GI distress also  . Latex Hives  . Exenatide Nausea Only and Other (See Comments)    Byetta- Nausea and abdominal pain, also  . Garlic Other (See Comments)    Severe acid reflux  . Onion Other (See Comments)    Severe acid reflux  . Rosiglitazone Other (See Comments)    Avandia- Affected heart  . Erythromycin Nausea And Vomiting    GI DISTRESS    VITALS:  Blood pressure (!) 146/70, pulse (!) 55, temperature 98.3 F (36.8 C), temperature source Oral, resp. rate 17, height 5\' 5"  (1.651 m), weight (!) 146.5 kg, SpO2 97 %.  PHYSICAL EXAMINATION:   Physical Exam  GENERAL:  63 y.o.-year-old obese patient lying in bed in no acute distress.  EYES: Pupils equal, round, reactive to light and accommodation. No scleral icterus. Extraocular muscles  intact.  HEENT: Head atraumatic, normocephalic. Oropharynx and nasopharynx clear.  NECK:  Supple, no jugular venous distention. No thyroid enlargement, no tenderness.  LUNGS: Good a/e b/l, no wheezing, rales, rhonchi. No use of accessory muscles of respiration.  CARDIOVASCULAR: S1, S2 normal. No murmurs, rubs, or gallops.  ABDOMEN: Soft, nontender, nondistended. Bowel sounds present. No organomegaly or mass.  EXTREMITIES: No cyanosis, clubbing, +1 edema b/l  NEUROLOGIC: Cranial nerves II through XII are intact. No focal Motor or sensory deficits b/l. Globally weak.   PSYCHIATRIC: The patient is alert and oriented x 3.  SKIN: No obvious rash, lesion, or ulcer.    LABORATORY PANEL:   CBC Recent Labs  Lab 10/13/18 0353  WBC 14.1*  HGB 10.9*  HCT 36.7  PLT 196   ------------------------------------------------------------------------------------------------------------------  Chemistries  Recent Labs  Lab 10/11/18 1056  10/12/18 2350  10/14/18 0509  NA 140  --  141   < > 141  K 2.8*   < > 3.8   < > 3.5  CL 91*  --  94*   < > 95*  CO2 37*  --  34*   < > 36*  GLUCOSE 162*  --  233*   < > 159*  BUN 50*  --  51*   < > 58*  CREATININE 1.99*  --  1.98*   < > 2.17*  CALCIUM 9.1  --  8.7*   < > 8.4*  MG 2.6*  --   --   --   --  AST  --   --  45*  --   --   ALT  --   --  22  --   --   ALKPHOS  --   --  81  --   --   BILITOT  --   --  0.9  --   --    < > = values in this interval not displayed.   ------------------------------------------------------------------------------------------------------------------  Cardiac Enzymes Recent Labs  Lab 10/12/18 2350  TROPONINI 0.05*   ------------------------------------------------------------------------------------------------------------------  RADIOLOGY:  Dg Chest 1 View  Result Date: 10/12/2018 CLINICAL DATA:  Initial evaluation for acute chest pain. EXAM: CHEST  1 VIEW COMPARISON:  Prior radiograph from 10/10/2018 FINDINGS:  Cardiomegaly.  Mediastinal silhouette normal. Lungs normally inflated. Diffuse pulmonary vascular and interstitial congestion without frank pulmonary edema. No consolidative opacity. No pneumothorax. No pleural effusion. No acute osseous finding. IMPRESSION: 1. Cardiomegaly with mild diffuse pulmonary interstitial congestion without frank pulmonary edema. 2. No other acute cardiopulmonary abnormality. Electronically Signed   By: Jeannine Boga M.D.   On: 10/12/2018 23:20     ASSESSMENT AND PLAN:   63 year old female with past medical history of obstructive sleep apnea, hypertension, hypothyroidism, GERD, chronic diastolic CHF, CKD stage III, anxiety, neuropathy who presented to the hospital due to shortness of breath.  1.  Acute respiratory failure with hypoxia- secondary to volume overload/CHF. - On ambulation patient was significantly quite hypoxic today.  Will continue O2 supplementation, will diuresis with IV Lasix, wean off oxygen as tolerated.  2.  CHF-acute on chronic diastolic dysfunction.  Patient still quite hypoxic on minimal exertion. - We will change from oral torsemide to IV Lasix today, follow I's and O's and daily weights.  3.  History of atrial fibrillation- rate controlled.  Continue amiodarone, continue Coumadin.    4.  Diabetes type 2 with neuropathy- continue Levemir, sliding scale insulin. -Continue Tradjenta.  5.  Hypothyroidism-continue Synthroid.  6.  GERD-continue Protonix.  7.  Depression-continue Celexa.  8.  Neuropathy-continue gabapentin.  All the records are reviewed and case discussed with Care Management/Social Worker. Management plans discussed with the patient, family and they are in agreement.  CODE STATUS: Full code  DVT Prophylaxis: Warfarin  TOTAL TIME TAKING CARE OF THIS PATIENT: 30 minutes.   POSSIBLE D/C IN 1-2 DAYS, DEPENDING ON CLINICAL CONDITION.   Henreitta Leber M.D on 10/14/2018 at 1:51 PM  Between 7am to 6pm - Pager -  [phone]  After 6pm go to [email or web address] - Proofreader  [facility]  Office  [phone]  CC: Primary care physician; Sofie Hartigan, MD

## 2018-10-14 NOTE — Plan of Care (Signed)
  Problem: Health Behavior/Discharge Planning: Goal: Ability to manage health-related needs will improve Outcome: Progressing   Problem: Clinical Measurements: Goal: Ability to maintain clinical measurements within normal limits will improve Outcome: Progressing Goal: Will remain free from infection Outcome: Progressing Note:  Remains afebrile   Problem: Clinical Measurements: Goal: Diagnostic test results will improve Outcome: Not Progressing Note:  K 3.5, BUN 58/2.17, BNP on admission 379

## 2018-10-14 NOTE — Progress Notes (Signed)
[place] for warfarin dosing Indication: atrial fibrillation  Allergies  Allergen Reactions  . Other Other (See Comments)    ILOSONE- Caused GI distress also  . Latex Hives  . Exenatide Nausea Only and Other (See Comments)    Byetta- Nausea and abdominal pain, also  . Garlic Other (See Comments)    Severe acid reflux  . Onion Other (See Comments)    Severe acid reflux  . Rosiglitazone Other (See Comments)    Avandia- Affected heart  . Erythromycin Nausea And Vomiting    GI DISTRESS    Patient Measurements: Height: 5\' 5"  (165.1 cm) Weight: (!) 323 lb (146.5 kg) IBW/kg (Calculated) : 57  Vital Signs: Temp: 98.3 F (36.8 C) (03/07 0738) Temp Source: Oral (03/07 0738) BP: 146/70 (03/07 0738) Pulse Rate: 55 (03/07 0738)  Labs: Recent Labs    10/12/18 2350 10/13/18 0353 10/14/18 0509  HGB 12.0 10.9*  --   HCT 40.5 36.7  --   PLT 228 196  --   APTT 24  --   --   LABPROT 17.1*  --  19.3*  INR 1.4*  --  1.7*  CREATININE 1.98* 2.02* 2.17*  TROPONINI 0.05*  --   --     Estimated Creatinine Clearance: 38.9 mL/min (A) (by C-G formula based on SCr of 2.17 mg/dL (H)).   Medical History: Past Medical History:  Diagnosis Date  . Anemia   . Anxiety   . CHF (congestive heart failure) ([place])   . Chronic kidney disease    INSUFFICIENCY  . Complication of anesthesia    had to be intubated during cataract surgery   " I could not breath "  . COPD (chronic obstructive pulmonary disease) ([place])   . Diabetes mellitus without complication ([place])   . Dyspnea    DOE  . Dysrhythmia    A FIB  . Edema   . Fracture of distal femur ([place]) 11/2017   left   . GERD (gastroesophageal reflux disease)   . History of kidney stones   . Hypertension   . Hypothyroidism    ABLATION  . Iron deficiency anemia 03/31/2017  . Neuropathy   . Orthopnea   . Sleep apnea    CPAP  . Vertigo   . Wheezing     Medications:  Home warfarin dose 2.5 mg daily.    Assessment: Pharmacy consulted for warfarin dosing and monitoring for 63 yo female with PMH of A. Fib. INR subtherapeutic on admission. Patient takes amiodarone 200mg  daily.   Date:    INR:      Dose: 3/6        1.4        3mg  3/7        1.7       Goal of Therapy:  INR 2-3   Plan:  Will order Warfarin 3mg  x 1 dose tonight.   Pharmacy will continue to order warfarin dose daily based on INRs.  Will check CBC at least every 3 days per protocol.    Olivia Canter [facility] Clinical Pharmacist 10/14/2018 12:21 PM

## 2018-10-15 DIAGNOSIS — Z7951 Long term (current) use of inhaled steroids: Secondary | ICD-10-CM | POA: Diagnosis not present

## 2018-10-15 DIAGNOSIS — Z833 Family history of diabetes mellitus: Secondary | ICD-10-CM | POA: Diagnosis not present

## 2018-10-15 DIAGNOSIS — Z825 Family history of asthma and other chronic lower respiratory diseases: Secondary | ICD-10-CM | POA: Diagnosis not present

## 2018-10-15 DIAGNOSIS — Z79890 Hormone replacement therapy (postmenopausal): Secondary | ICD-10-CM | POA: Diagnosis not present

## 2018-10-15 DIAGNOSIS — I4891 Unspecified atrial fibrillation: Secondary | ICD-10-CM | POA: Diagnosis present

## 2018-10-15 DIAGNOSIS — Z881 Allergy status to other antibiotic agents status: Secondary | ICD-10-CM | POA: Diagnosis not present

## 2018-10-15 DIAGNOSIS — N183 Chronic kidney disease, stage 3 (moderate): Secondary | ICD-10-CM | POA: Diagnosis present

## 2018-10-15 DIAGNOSIS — Z888 Allergy status to other drugs, medicaments and biological substances status: Secondary | ICD-10-CM | POA: Diagnosis not present

## 2018-10-15 DIAGNOSIS — J9621 Acute and chronic respiratory failure with hypoxia: Secondary | ICD-10-CM | POA: Diagnosis present

## 2018-10-15 DIAGNOSIS — Z7901 Long term (current) use of anticoagulants: Secondary | ICD-10-CM | POA: Diagnosis not present

## 2018-10-15 DIAGNOSIS — I509 Heart failure, unspecified: Secondary | ICD-10-CM

## 2018-10-15 DIAGNOSIS — Z794 Long term (current) use of insulin: Secondary | ICD-10-CM | POA: Diagnosis not present

## 2018-10-15 DIAGNOSIS — I248 Other forms of acute ischemic heart disease: Secondary | ICD-10-CM | POA: Diagnosis present

## 2018-10-15 DIAGNOSIS — E039 Hypothyroidism, unspecified: Secondary | ICD-10-CM | POA: Diagnosis present

## 2018-10-15 DIAGNOSIS — Z91040 Latex allergy status: Secondary | ICD-10-CM | POA: Diagnosis not present

## 2018-10-15 DIAGNOSIS — I130 Hypertensive heart and chronic kidney disease with heart failure and stage 1 through stage 4 chronic kidney disease, or unspecified chronic kidney disease: Secondary | ICD-10-CM | POA: Diagnosis present

## 2018-10-15 DIAGNOSIS — J9622 Acute and chronic respiratory failure with hypercapnia: Secondary | ICD-10-CM | POA: Diagnosis present

## 2018-10-15 DIAGNOSIS — K219 Gastro-esophageal reflux disease without esophagitis: Secondary | ICD-10-CM | POA: Diagnosis present

## 2018-10-15 DIAGNOSIS — D62 Acute posthemorrhagic anemia: Secondary | ICD-10-CM | POA: Diagnosis present

## 2018-10-15 DIAGNOSIS — G4733 Obstructive sleep apnea (adult) (pediatric): Secondary | ICD-10-CM | POA: Diagnosis present

## 2018-10-15 DIAGNOSIS — I5033 Acute on chronic diastolic (congestive) heart failure: Secondary | ICD-10-CM | POA: Diagnosis present

## 2018-10-15 DIAGNOSIS — F419 Anxiety disorder, unspecified: Secondary | ICD-10-CM | POA: Diagnosis present

## 2018-10-15 DIAGNOSIS — J449 Chronic obstructive pulmonary disease, unspecified: Secondary | ICD-10-CM | POA: Diagnosis present

## 2018-10-15 DIAGNOSIS — Z8249 Family history of ischemic heart disease and other diseases of the circulatory system: Secondary | ICD-10-CM | POA: Diagnosis not present

## 2018-10-15 DIAGNOSIS — Z87891 Personal history of nicotine dependence: Secondary | ICD-10-CM | POA: Diagnosis not present

## 2018-10-15 LAB — BASIC METABOLIC PANEL
Anion gap: 13 (ref 5–15)
BUN: 59 mg/dL — AB (ref 8–23)
CO2: 36 mmol/L — ABNORMAL HIGH (ref 22–32)
Calcium: 9 mg/dL (ref 8.9–10.3)
Chloride: 90 mmol/L — ABNORMAL LOW (ref 98–111)
Creatinine, Ser: 2.24 mg/dL — ABNORMAL HIGH (ref 0.44–1.00)
GFR calc Af Amer: 26 mL/min — ABNORMAL LOW (ref >60)
GFR calc non Af Amer: 23 mL/min — ABNORMAL LOW (ref >60)
Glucose, Bld: 138 mg/dL — ABNORMAL HIGH (ref 70–99)
POTASSIUM: 3.3 mmol/L — AB (ref 3.5–5.1)
Sodium: 139 mmol/L (ref 135–145)

## 2018-10-15 LAB — GLUCOSE, CAPILLARY
Glucose-Capillary: 127 mg/dL — ABNORMAL HIGH (ref 70–99)
Glucose-Capillary: 188 mg/dL — ABNORMAL HIGH (ref 70–99)
Glucose-Capillary: 144 mg/dL — ABNORMAL HIGH (ref 70–99)
Glucose-Capillary: 158 mg/dL — ABNORMAL HIGH (ref 70–99)

## 2018-10-15 LAB — PROTIME-INR
INR: 1.8 — ABNORMAL HIGH (ref 0.8–1.2)
Prothrombin Time: 20.4 seconds — ABNORMAL HIGH (ref 11.4–15.2)

## 2018-10-15 MED ORDER — WARFARIN SODIUM 3 MG PO TABS
3.0000 mg | ORAL_TABLET | Freq: Once | ORAL | Status: AC
  Administered 2018-10-15: 3 mg via ORAL
  Filled 2018-10-15: qty 1

## 2018-10-15 NOTE — Progress Notes (Signed)
SATURATION QUALIFICATIONS: (This note is used to comply with regulatory documentation for home oxygen)  Patient Saturations on Room Air at Rest = 75%  Patient Saturations on Room Air while Ambulating =na%  Patient Saturations on 2 Liters of oxygen at rest= 98%  Please briefly explain why patient needs home oxygen:

## 2018-10-15 NOTE — Progress Notes (Signed)
[place] for warfarin dosing Indication: atrial fibrillation  Allergies  Allergen Reactions  . Other Other (See Comments)    ILOSONE- Caused GI distress also  . Latex Hives  . Exenatide Nausea Only and Other (See Comments)    Byetta- Nausea and abdominal pain, also  . Garlic Other (See Comments)    Severe acid reflux  . Onion Other (See Comments)    Severe acid reflux  . Rosiglitazone Other (See Comments)    Avandia- Affected heart  . Erythromycin Nausea And Vomiting    GI DISTRESS    Patient Measurements: Height: 5\' 5"  (165.1 cm) Weight: (!) 323 lb (146.5 kg) IBW/kg (Calculated) : 57  Vital Signs: Temp: 97.5 F (36.4 C) (03/08 0742) Temp Source: Oral (03/08 0742) BP: 139/60 (03/08 0742) Pulse Rate: 59 (03/08 0742)  Labs: Recent Labs    10/12/18 2350 10/13/18 0353 10/14/18 0509 10/15/18 0458  HGB 12.0 10.9*  --   --   HCT 40.5 36.7  --   --   PLT 228 196  --   --   APTT 24  --   --   --   LABPROT 17.1*  --  19.3* 20.4*  INR 1.4*  --  1.7* 1.8*  CREATININE 1.98* 2.02* 2.17* 2.24*  TROPONINI 0.05*  --   --   --     Estimated Creatinine Clearance: 37.7 mL/min (A) (by C-G formula based on SCr of 2.24 mg/dL (H)).   Medical History: Past Medical History:  Diagnosis Date  . Anemia   . Anxiety   . CHF (congestive heart failure) ([place])   . Chronic kidney disease    INSUFFICIENCY  . Complication of anesthesia    had to be intubated during cataract surgery   " I could not breath "  . COPD (chronic obstructive pulmonary disease) ([place])   . Diabetes mellitus without complication ([place])   . Dyspnea    DOE  . Dysrhythmia    A FIB  . Edema   . Fracture of distal femur ([place]) 11/2017   left   . GERD (gastroesophageal reflux disease)   . History of kidney stones   . Hypertension   . Hypothyroidism    ABLATION  . Iron deficiency anemia 03/31/2017  . Neuropathy   . Orthopnea   . Sleep apnea    CPAP  . Vertigo   .  Wheezing     Medications:  Home warfarin dose 2.5 mg daily.   Assessment: Pharmacy consulted for warfarin dosing and monitoring for 63 yo female with PMH of A. Fib. INR subtherapeutic on admission. Patient takes amiodarone 200mg  daily.   Date:    INR:      Dose: 3/6        1.4        3mg  3/7        1.7        3mg  3/8        1.8   Goal of Therapy:  INR 2-3   Plan:  Will order Warfarin 3mg  x 1 dose tonight.   Pharmacy will continue to order warfarin dose daily based on INRs.  Will check CBC at least every 3 days per protocol.    Olivia Canter [facility] Clinical Pharmacist 10/15/2018 9:06 AM

## 2018-10-15 NOTE — Progress Notes (Signed)
[place] at [place] NAME: Andrea Rivers    MR#:  [id number]  DATE OF BIRTH:  29-Jul-1956  SUBJECTIVE:   No acute events overnight, remains significantly hypoxic on minimal ambulation.  Diuresing with IV Lasix.  Creatinine remained stable.  No other complaints presently.  REVIEW OF SYSTEMS:    Review of Systems  Constitutional: Negative for chills and fever.  HENT: Negative for congestion and tinnitus.   Eyes: Negative for blurred vision and double vision.  Respiratory: Positive for shortness of breath. Negative for cough and wheezing.   Cardiovascular: Negative for chest pain, orthopnea and PND.  Gastrointestinal: Negative for abdominal pain, diarrhea, nausea and vomiting.  Genitourinary: Negative for dysuria and hematuria.  Neurological: Negative for dizziness, sensory change and focal weakness.  All other systems reviewed and are negative.   Nutrition: Heart healthy/Carb control Tolerating Diet: Yes Tolerating PT: Await Eval.   DRUG ALLERGIES:   Allergies  Allergen Reactions  . Other Other (See Comments)    ILOSONE- Caused GI distress also  . Latex Hives  . Exenatide Nausea Only and Other (See Comments)    Byetta- Nausea and abdominal pain, also  . Garlic Other (See Comments)    Severe acid reflux  . Onion Other (See Comments)    Severe acid reflux  . Rosiglitazone Other (See Comments)    Avandia- Affected heart  . Erythromycin Nausea And Vomiting    GI DISTRESS    VITALS:  Blood pressure 139/60, pulse (!) 59, temperature (!) 97.5 F (36.4 C), temperature source Oral, resp. rate 18, height 5\' 5"  (1.651 m), weight (!) 146.5 kg, SpO2 98 %.  PHYSICAL EXAMINATION:   Physical Exam  GENERAL:  63 y.o.-year-old obese patient lying in bed in no acute distress.  EYES: Pupils equal, round, reactive to light and accommodation. No scleral icterus. Extraocular muscles intact.  HEENT: Head atraumatic, normocephalic.  Oropharynx and nasopharynx clear.  NECK:  Supple, no jugular venous distention. No thyroid enlargement, no tenderness.  LUNGS: Good a/e b/l, no wheezing, minimal rales @ bases, No rhonchi. No use of accessory muscles of respiration.  CARDIOVASCULAR: S1, S2 normal. No murmurs, rubs, or gallops.  ABDOMEN: Soft, nontender, nondistended. Bowel sounds present. No organomegaly or mass.  EXTREMITIES: No cyanosis, clubbing, Trace edema b/l.  NEUROLOGIC: Cranial nerves II through XII are intact. No focal Motor or sensory deficits b/l. Globally weak.   PSYCHIATRIC: The patient is alert and oriented x 3.  SKIN: No obvious rash, lesion, or ulcer.    LABORATORY PANEL:   CBC Recent Labs  Lab 10/13/18 0353  WBC 14.1*  HGB 10.9*  HCT 36.7  PLT 196   ------------------------------------------------------------------------------------------------------------------  Chemistries  Recent Labs  Lab 10/11/18 1056  10/12/18 2350  10/15/18 0458  NA 140  --  141   < > 139  K 2.8*   < > 3.8   < > 3.3*  CL 91*  --  94*   < > 90*  CO2 37*  --  34*   < > 36*  GLUCOSE 162*  --  233*   < > 138*  BUN 50*  --  51*   < > 59*  CREATININE 1.99*  --  1.98*   < > 2.24*  CALCIUM 9.1  --  8.7*   < > 9.0  MG 2.6*  --   --   --   --   AST  --   --  45*  --   --  ALT  --   --  22  --   --   ALKPHOS  --   --  81  --   --   BILITOT  --   --  0.9  --   --    < > = values in this interval not displayed.   ------------------------------------------------------------------------------------------------------------------  Cardiac Enzymes Recent Labs  Lab 10/12/18 2350  TROPONINI 0.05*   ------------------------------------------------------------------------------------------------------------------  RADIOLOGY:  No results found.   ASSESSMENT AND PLAN:   63 year old female with past medical history of obstructive sleep apnea, hypertension, hypothyroidism, GERD, chronic diastolic CHF, CKD stage III,  anxiety, neuropathy who presented to the hospital due to shortness of breath.  1.  Acute respiratory failure with hypoxia- secondary to volume overload/CHF. - On ambulation patient pt. Remains quite hypoxic and will likely need Home O2.  -Continue diuresis with IV Lasix, wean oxygen as tolerated.  2.  CHF- acute on chronic diastolic dysfunction.  Patient still quite hypoxic on minimal exertion. -Continue diuresis with IV Lasix, follow I's and O's and daily weights. -Patient is clinically responding to diuretics.  3.  CKD stage III-patient's creatinine is close to baseline we will continue to monitor with diuresis.  4.  History of atrial fibrillation- rate controlled.  Continue amiodarone, continue Coumadin.    5.  Diabetes type 2 with neuropathy- continue Levemir, sliding scale insulin. -Continue Tradjenta.  6.  Hypothyroidism-continue Synthroid.  7.  GERD-continue Protonix.  8.  Depression-continue Celexa.  9.  Neuropathy-continue gabapentin.  Possible d/c home tomorrow.   All the records are reviewed and case discussed with Care Management/Social Worker. Management plans discussed with the patient, family and they are in agreement.  CODE STATUS: Full code  DVT Prophylaxis: Warfarin  TOTAL TIME TAKING CARE OF THIS PATIENT: 30 minutes.   POSSIBLE D/C IN 1-2 DAYS, DEPENDING ON CLINICAL CONDITION.   Henreitta Leber M.D on 10/15/2018 at 2:16 PM  Between 7am to 6pm - Pager - [phone]  After 6pm go to [email or web address] - Proofreader  [facility] [place] [facility]  Office  [phone]  CC: Primary care physician; Sofie Hartigan, MD

## 2018-10-16 LAB — BASIC METABOLIC PANEL
Anion gap: 11 (ref 5–15)
BUN: 59 mg/dL — ABNORMAL HIGH (ref 8–23)
CO2: 37 mmol/L — ABNORMAL HIGH (ref 22–32)
Calcium: 8.7 mg/dL — ABNORMAL LOW (ref 8.9–10.3)
Chloride: 91 mmol/L — ABNORMAL LOW (ref 98–111)
Creatinine, Ser: 1.81 mg/dL — ABNORMAL HIGH (ref 0.44–1.00)
GFR calc Af Amer: 34 mL/min — ABNORMAL LOW (ref >60)
GFR calc non Af Amer: 29 mL/min — ABNORMAL LOW (ref >60)
Glucose, Bld: 135 mg/dL — ABNORMAL HIGH (ref 70–99)
POTASSIUM: 3.5 mmol/L (ref 3.5–5.1)
Sodium: 139 mmol/L (ref 135–145)

## 2018-10-16 LAB — GLUCOSE, CAPILLARY
Glucose-Capillary: 134 mg/dL — ABNORMAL HIGH (ref 70–99)
Glucose-Capillary: 179 mg/dL — ABNORMAL HIGH (ref 70–99)

## 2018-10-16 LAB — PROTIME-INR
INR: 1.8 — ABNORMAL HIGH (ref 0.8–1.2)
Prothrombin Time: 20.7 seconds — ABNORMAL HIGH (ref 11.4–15.2)

## 2018-10-16 MED ORDER — WARFARIN SODIUM 4 MG PO TABS
4.0000 mg | ORAL_TABLET | Freq: Once | ORAL | Status: DC
  Filled 2018-10-16: qty 1

## 2018-10-16 NOTE — Progress Notes (Signed)
Patient given discharge instructions. IV's taken out and tele monitor off. Patient verbalized understanding with no questions or concerns. Oxygen tank delivered in room. Patient discharge in stable condition.

## 2018-10-16 NOTE — Discharge Summary (Signed)
[facility] NAME: Andrea Rivers    MR#:  [id number]  DATE OF BIRTH:  [birth year]  DATE OF ADMISSION:  10/12/2018 ADMITTING PHYSICIAN: Lance Coon, MD  DATE OF DISCHARGE: 10/16/2018  1:29 PM  PRIMARY CARE PHYSICIAN: Sofie Hartigan, MD    ADMISSION DIAGNOSIS:  Demand ischemia ([place]) [I24.8] Elevated troponin I level [R79.89] Chest pain [R07.9] Acute respiratory failure with hypoxia and hypercapnia (HCC) [J96.01, J96.02] Acute on chronic congestive heart failure, unspecified heart failure type ([place]) [I50.9]  DISCHARGE DIAGNOSIS:  Principal Problem:   Acute on chronic diastolic CHF (congestive heart failure) (HCC) Active Problems:   HTN (hypertension)   Diabetes ([place])   Hypothyroidism   Atrial fibrillation (HCC)   CHF (congestive heart failure) ([place])   SECONDARY DIAGNOSIS:   Past Medical History:  Diagnosis Date  . Anemia   . Anxiety   . CHF (congestive heart failure) ([place])   . Chronic kidney disease    INSUFFICIENCY  . Complication of anesthesia    had to be intubated during cataract surgery   " I could not breath "  . COPD (chronic obstructive pulmonary disease) ([place])   . Diabetes mellitus without complication ([place])   . Dyspnea    DOE  . Dysrhythmia    A FIB  . Edema   . Fracture of distal femur ([place]) 11/2017   left   . GERD (gastroesophageal reflux disease)   . History of kidney stones   . Hypertension   . Hypothyroidism    ABLATION  . Iron deficiency anemia 03/31/2017  . Neuropathy   . Orthopnea   . Sleep apnea    CPAP  . Vertigo   . Wheezing     HOSPITAL COURSE:   63 year old female with past medical history of obstructive sleep apnea, hypertension, hypothyroidism, GERD, chronic diastolic CHF, CKD stage III, anxiety, neuropathy who presented to the hospital due to shortness of breath.  1.  Acute respiratory failure with hypoxia- secondary to volume overload/CHF. -Patient was diuresed with IV  Lasix, and maintained on oxygen while in the hospital.  She has clinically improved but upon ambulation still still quite hypoxic therefore home oxygen was arranged for her prior to discharge.  2.  CHF- acute on chronic diastolic dysfunction.   -Initially patient was placed on oral torsemide but due to volume overload she was diuresed with IV Lasix for 1 to 2 days and has improved.  She remains hypoxic on ambulation therefore oxygen is being arranged for her. -She will continue her oral torsemide and metolazone, she will follow-up at the heart failure clinic and also with her cardiologist as an outpatient in the next 1 to 2 weeks.  3.  CKD stage III- his creatinine remained close to baseline with diuresis can be further followed up as an outpatient with her nephrologist.  4.  History of atrial fibrillation- remained rate controlled.  she will Continue amiodarone, Coumadin.    5.  Diabetes type 2 with neuropathy-  patient will continue her Levemir, Tradjenta upon discharge.  Blood sugars remained stable while in the hospital.  6.  Hypothyroidism- she will continue Synthroid.  7.  GERD- she will continue Protonix.  8.  Depression- will continue Celexa.  9.  Neuropathy-continue gabapentin.  DISCHARGE CONDITIONS:   Stable.   CONSULTS OBTAINED:    DRUG ALLERGIES:   Allergies  Allergen Reactions  . Other Other (See Comments)    ILOSONE- Caused GI distress also  .  Latex Hives  . Exenatide Nausea Only and Other (See Comments)    Byetta- Nausea and abdominal pain, also  . Garlic Other (See Comments)    Severe acid reflux  . Onion Other (See Comments)    Severe acid reflux  . Rosiglitazone Other (See Comments)    Avandia- Affected heart  . Erythromycin Nausea And Vomiting    GI DISTRESS    DISCHARGE MEDICATIONS:   Allergies as of 10/16/2018      Reactions   Other Other (See Comments)   ILOSONE- Caused GI distress also   Latex Hives   Exenatide Nausea Only, Other (See  Comments)   Byetta- Nausea and abdominal pain, also   Garlic Other (See Comments)   Severe acid reflux   Onion Other (See Comments)   Severe acid reflux   Rosiglitazone Other (See Comments)   Avandia- Affected heart   Erythromycin Nausea And Vomiting   GI DISTRESS      Medication List    STOP taking these medications   doxycycline 100 MG capsule Commonly known as:  VIBRAMYCIN     TAKE these medications   acetaminophen 500 MG tablet Commonly known as:  TYLENOL Take 1,000 mg by mouth 2 (two) times daily as needed for mild pain, fever or headache.   amiodarone 200 MG tablet Commonly known as:  PACERONE Take 200 mg by mouth daily.   Biotin 10 MG Caps Take 1 capsule by mouth daily.   BOSWELLIA PO Take 1 capsule by mouth 2 (two) times daily.   Calcium 600-D 600-400 MG-UNIT Tabs Generic drug:  Calcium Carbonate-Vitamin D3 Take 1 tablet by mouth 3 (three) times daily with meals.   Centrum Women Tabs Take 1 tablet by mouth daily.   cetirizine 10 MG tablet Commonly known as:  ZYRTEC Take 10 mg by mouth daily.   citalopram 40 MG tablet Commonly known as:  CELEXA Take 40 mg by mouth daily.   clonazePAM 1 MG tablet Commonly known as:  KLONOPIN Take 1 tablet by mouth 2 (two) times daily as needed for anxiety.   cloNIDine 0.1 MG tablet Commonly known as:  Catapres Take 1 tablet (0.1 mg total) by mouth 2 (two) times daily.   Co-Enzyme Q-10 100 MG Caps Take 100 mg by mouth daily.   cyclobenzaprine 5 MG tablet Commonly known as:  FLEXERIL Take 5 mg by mouth 3 (three) times daily as needed for muscle spasms.   D3 High Potency 125 MCG (5000 UT) capsule Generic drug:  Cholecalciferol Take 5,000 Units by mouth daily.   docusate sodium 50 MG capsule Commonly known as:  COLACE Take 100-200 mg by mouth 2 (two) times daily. 200mg  in the morning and 100mg  in the evening   Fish Oil 1000 MG Cpdr Take 1 capsule by mouth daily.   gabapentin 100 MG capsule Commonly known  as:  NEURONTIN Take 100 mg by mouth 3 (three) times daily.   Garlic 5681 MG Caps Take 1,000 mg by mouth daily.   glimepiride 2 MG tablet Commonly known as:  AMARYL Take 2 mg by mouth daily with breakfast.   HYDROcodone-acetaminophen 10-325 MG tablet Commonly known as:  NORCO Take 1 tablet by mouth every 6 (six) hours as needed for severe pain.   insulin lispro 100 UNIT/ML injection Commonly known as:  HUMALOG Inject 10-30 Units into the skin 3 (three) times daily before meals. 30 u in the morning, evening 30units 10-20 u at bedtime   Levemir FlexTouch 100 UNIT/ML Pen Generic  drug:  Insulin Detemir Inject 23 Units into the skin at bedtime.   levothyroxine 175 MCG tablet Commonly known as:  SYNTHROID, LEVOTHROID Take 175 mcg by mouth every evening.   lovastatin 20 MG tablet Commonly known as:  MEVACOR Take 20 mg by mouth at bedtime.   magnesium oxide 400 MG tablet Commonly known as:  MAG-OX Take 400 mg by mouth 2 (two) times daily.   metolazone 5 MG tablet Commonly known as:  ZAROXOLYN Take 5 mg by mouth as needed (for a weight gain of 2 pounds overnight or 5 pounds in a week; MAX OF 5 TABLETS AT A TIME).   pantoprazole 40 MG tablet Commonly known as:  PROTONIX Take 1 tablet (40 mg total) by mouth 2 (two) times daily before a meal.   potassium chloride SA 20 MEQ tablet Commonly known as:  K-DUR,KLOR-CON Take 2 tablets (40 mEq total) by mouth daily. What changed:    how much to take  when to take this  additional instructions   predniSONE 20 MG tablet Commonly known as:  DELTASONE Take 20 mg by mouth as directed.   ProAir HFA 108 (90 Base) MCG/ACT inhaler Generic drug:  albuterol Inhale 2 puffs into the lungs every 4 (four) hours as needed for wheezing or shortness of breath.   senna 8.6 MG Tabs tablet Commonly known as:  SENOKOT Take 8.6-17.2 mg by mouth 2 (two) times daily. 17.2mg  (2 tablets) in the morning and 8.6mg  in the evening   torsemide 20 MG  tablet Commonly known as:  DEMADEX TAKE 2 TABLETS BY MOUTH TWICE A DAY What changed:  when to take this   Tradjenta 5 MG Tabs tablet Generic drug:  linagliptin take 1 tablet by mouth once daily   vitamin C 500 MG tablet Commonly known as:  ASCORBIC ACID Take 500 mg by mouth daily.   warfarin 5 MG tablet Commonly known as:  COUMADIN Take 2.5 mg by mouth daily at 6 PM.   Wixela Inhub 250-50 MCG/DOSE Aepb Generic drug:  Fluticasone-Salmeterol Inhale 1 puff into the lungs 2 (two) times daily.            Durable Medical Equipment  (From admission, onward)         Start     Ordered   10/16/18 1157  For home use only DME oxygen  Once    Question Answer Comment  Mode or (Route) Nasal cannula   Liters per Minute 2   Frequency Continuous (stationary and portable oxygen unit needed)   Oxygen conserving device Yes   Oxygen delivery system Gas      10/16/18 1157   10/16/18 1052  For home use only DME oxygen  Once    Question Answer Comment  Mode or (Route) Nasal cannula   Liters per Minute 2   Frequency Continuous (stationary and portable oxygen unit needed)   Oxygen conserving device Yes   Oxygen delivery system Gas      10/16/18 1052            DISCHARGE INSTRUCTIONS:   DIET:  Cardiac diet and Diabetic diet  DISCHARGE CONDITION:  Stable  ACTIVITY:  Activity as tolerated  OXYGEN:  Home Oxygen: Yes.     Oxygen Delivery: 2 liters/min via Patient connected to nasal cannula oxygen  DISCHARGE LOCATION:  Home with [facility]   If you experience worsening of your admission symptoms, develop shortness of breath, life threatening emergency, suicidal or homicidal thoughts you must seek medical  attention immediately by calling 911 or calling your MD immediately  if symptoms less severe.  You Must read complete instructions/literature along with all the possible adverse reactions/side effects for all the Medicines you take and that have been prescribed  to you. Take any new Medicines after you have completely understood and accpet all the possible adverse reactions/side effects.   Please note  You were cared for by a hospitalist during your hospital stay. If you have any questions about your discharge medications or the care you received while you were in the hospital after you are discharged, you can call the unit and asked to speak with the hospitalist on call if the hospitalist that took care of you is not available. Once you are discharged, your primary care physician will handle any further medical issues. Please note that NO REFILLS for any discharge medications will be authorized once you are discharged, as it is imperative that you return to your primary care physician (or establish a relationship with a primary care physician if you do not have one) for your aftercare needs so that they can reassess your need for medications and monitor your lab values.     Today   Shortness of breath has improved, she denies any chest pains, nausea, vomiting.  Diuresed well with IV Lasix.  Will discharge home today on home oxygen.  VITAL SIGNS:  Blood pressure (!) 114/51, pulse 69, temperature 98.2 F (36.8 C), temperature source Oral, resp. rate 20, height 5\' 5"  (1.651 m), weight (!) 145.8 kg, SpO2 100 %.  I/O:    Intake/Output Summary (Last 24 hours) at 10/16/2018 1525 Last data filed at 10/16/2018 1000 Gross per 24 hour  Intake 920 ml  Output 2100 ml  Net -1180 ml    PHYSICAL EXAMINATION:   GENERAL:  63 y.o.-year-old obese patient lying in bed in no acute distress.  EYES: Pupils equal, round, reactive to light and accommodation. No scleral icterus. Extraocular muscles intact.  HEENT: Head atraumatic, normocephalic. Oropharynx and nasopharynx clear.  NECK:  Supple, no jugular venous distention. No thyroid enlargement, no tenderness.  LUNGS: Good a/e b/l, no wheezing, rales No rhonchi. No use of accessory muscles of respiration.   CARDIOVASCULAR: S1, S2 normal. No murmurs, rubs, or gallops.  ABDOMEN: Soft, nontender, nondistended. Bowel sounds present. No organomegaly or mass.  EXTREMITIES: No cyanosis, clubbing, Trace edema b/l.  NEUROLOGIC: Cranial nerves II through XII are intact. No focal Motor or sensory deficits b/l. Globally weak.   PSYCHIATRIC: The patient is alert and oriented x 3.  SKIN: No obvious rash, lesion, or ulcer.   DATA REVIEW:   CBC Recent Labs  Lab 10/13/18 0353  WBC 14.1*  HGB 10.9*  HCT 36.7  PLT 196    Chemistries  Recent Labs  Lab 10/11/18 1056  10/12/18 2350  10/16/18 0422  NA 140  --  141   < > 139  K 2.8*   < > 3.8   < > 3.5  CL 91*  --  94*   < > 91*  CO2 37*  --  34*   < > 37*  GLUCOSE 162*  --  233*   < > 135*  BUN 50*  --  51*   < > 59*  CREATININE 1.99*  --  1.98*   < > 1.81*  CALCIUM 9.1  --  8.7*   < > 8.7*  MG 2.6*  --   --   --   --   AST  --   --  45*  --   --   ALT  --   --  22  --   --   ALKPHOS  --   --  81  --   --   BILITOT  --   --  0.9  --   --    < > = values in this interval not displayed.    Cardiac Enzymes Recent Labs  Lab 10/12/18 2350  TROPONINI 0.05*     RADIOLOGY:  No results found.    Management plans discussed with the patient, family and they are in agreement.  CODE STATUS:     Code Status Orders  (From admission, onward)         Start     Ordered   10/13/18 0126  Full code  Continuous     10/13/18 0125       TOTAL TIME TAKING CARE OF THIS PATIENT: 40 minutes.    Henreitta Leber M.D on 10/16/2018 at 3:25 PM  Between 7am to 6pm - Pager - [phone]  After 6pm go to [email or web address] - Proofreader  [facility]  Office  [phone]  CC: Primary care physician; Sofie Hartigan, MD

## 2018-10-16 NOTE — Plan of Care (Signed)
  Problem: Health Behavior/Discharge Planning: Goal: Ability to manage health-related needs will improve Outcome: Adequate for Discharge   Problem: Clinical Measurements: Goal: Ability to maintain clinical measurements within normal limits will improve Outcome: Adequate for Discharge Goal: Will remain free from infection Outcome: Adequate for Discharge Goal: Diagnostic test results will improve Outcome: Adequate for Discharge Goal: Respiratory complications will improve Outcome: Adequate for Discharge Goal: Cardiovascular complication will be avoided Outcome: Adequate for Discharge   Problem: Activity: Goal: Risk for activity intolerance will decrease Outcome: Adequate for Discharge   Problem: Pain Managment: Goal: General experience of comfort will improve Outcome: Adequate for Discharge   Problem: Safety: Goal: Ability to remain free from injury will improve Outcome: Adequate for Discharge   

## 2018-10-16 NOTE — Progress Notes (Signed)
CHF packet given to patient and EMMI videos were also shown to patient. Patient verbalized she does weigh herself everyday and is familiar with her medications.

## 2018-10-16 NOTE — Progress Notes (Signed)
[facility] for warfarin dosing Indication: atrial fibrillation  Allergies  Allergen Reactions  . Other Other (See Comments)    ILOSONE- Caused GI distress also  . Latex Hives  . Exenatide Nausea Only and Other (See Comments)    Byetta- Nausea and abdominal pain, also  . Garlic Other (See Comments)    Severe acid reflux  . Onion Other (See Comments)    Severe acid reflux  . Rosiglitazone Other (See Comments)    Avandia- Affected heart  . Erythromycin Nausea And Vomiting    GI DISTRESS    Patient Measurements: Height: 5\' 5"  (165.1 cm) Weight: (!) 321 lb 6.2 oz (145.8 kg) IBW/kg (Calculated) : 57  Vital Signs: Temp: 98.2 F (36.8 C) (03/09 0820) Temp Source: Oral (03/09 0820) BP: 114/51 (03/09 0820) Pulse Rate: 69 (03/09 0820)  Labs: Recent Labs    10/14/18 0509 10/15/18 0458 10/16/18 0422  LABPROT 19.3* 20.4* 20.7*  INR 1.7* 1.8* 1.8*  CREATININE 2.17* 2.24* 1.81*    Estimated Creatinine Clearance: 46.5 mL/min (A) (by C-G formula based on SCr of 1.81 mg/dL (H)).   Medical History: Past Medical History:  Diagnosis Date  . Anemia   . Anxiety   . CHF (congestive heart failure) ([place])   . Chronic kidney disease    INSUFFICIENCY  . Complication of anesthesia    had to be intubated during cataract surgery   " I could not breath "  . COPD (chronic obstructive pulmonary disease) ([place])   . Diabetes mellitus without complication ([place])   . Dyspnea    DOE  . Dysrhythmia    A FIB  . Edema   . Fracture of distal femur ([place]) 11/2017   left   . GERD (gastroesophageal reflux disease)   . History of kidney stones   . Hypertension   . Hypothyroidism    ABLATION  . Iron deficiency anemia 03/31/2017  . Neuropathy   . Orthopnea   . Sleep apnea    CPAP  . Vertigo   . Wheezing     Medications:  Home warfarin dose 2.5 mg daily.   Assessment: Pharmacy consulted for warfarin dosing and monitoring for 63 yo female with PMH of A.  Fib. INR subtherapeutic on admission. Patient takes amiodarone 200mg  daily.   Date:    INR:      Dose: 3/6        1.4        3 mg 3/7        1.7        3 mg 3/8        1.8        3 mg 3/9        1.8        4 mg  Goal of Therapy:  INR 2-3   Plan:  Will order Warfarin 4mg  x 1 dose tonight. Once INR is therapeutic can decrease dose down to home dose of 3 mg.    Pharmacy will continue to order warfarin dose daily based on INRs.  Will check CBC at least every 3 days per protocol.    Oswald Hillock, PharmD, BCPS Clinical Pharmacist 10/16/2018 9:50 AM

## 2018-10-17 NOTE — Progress Notes (Signed)
Patient ID: Andrea Rivers, female    DOB: Sep 05, 1955, 63 y.o.   MRN: [id number]  HPI  Andrea Rivers is a 63 y/o female with a history of anemia, anxiety, CKD, COPD, DM, GERD, HTN, hypothyroidism, neuropathy, obstructive sleep apnea (+CPAP), previous tobacco use and chronic heart failure.  Echo report from 10/13/2018 reviewed and showed an EF of 55-60%. Echo report from 02/23/17 reviewed and showed an EF of 55-60% along with mild MR. Echo done 01/26/17 shows an EF of 50-55%.   Admitted 10/12/2018 due to acute on chronic heart failure. Initially needed IV lasix and then transitioned to oral diuretics. Did need oxygen for home use at discharge. Discharged after 4 days. Admitted 10/10/2018 due to GI bleed. Cardiology and GI consults obtained. Given 1 unit of blood. EGD done which showed gastritis along with removal of a single polyp. Declined further workup. Chest pain thought to be due to demand ischemia due to her anemia. Warfarin resumed after EGD was done. Discharged after 2 days. Was in the ED 10/02/2018 due to a cough where she was treated and released.   She presents today for a follow-up visit with a chief complaint of moderate fatigue upon minimal exertion. She says that this has been chronic in nature with varying severity over many years. Does feel like it has worsened over the last couple of months with her repeated hospital stays. She has associated shortness of breath, pedal edema, light-headedness, depression and slight weight gain along with this. She denies any difficulty sleeping, abdominal distention, palpitations, chest pain or cough. Has order for oxygen but is unable to lift the cylinder oxygen tanks as they are too heavy. She currently has 1 in her car as she can't get it into the house so the only time she is currently wearing oxygen is when she's in her car.   Past Medical History:  Diagnosis Date  . Anemia   . Anxiety   . CHF (congestive heart failure) ([place])   . Chronic kidney disease     INSUFFICIENCY  . Complication of anesthesia    had to be intubated during cataract surgery   " I could not breath "  . COPD (chronic obstructive pulmonary disease) ([place])   . Diabetes mellitus without complication ([place])   . Dyspnea    DOE  . Dysrhythmia    A FIB  . Edema   . Fracture of distal femur ([place]) 11/2017   left   . GERD (gastroesophageal reflux disease)   . History of kidney stones   . Hypertension   . Hypothyroidism    ABLATION  . Iron deficiency anemia 03/31/2017  . Neuropathy   . Orthopnea   . Sleep apnea    CPAP  . Vertigo   . Wheezing    Past Surgical History:  Procedure Laterality Date  . CATARACT EXTRACTION W/PHACO Left 01/26/2017   Procedure: CATARACT EXTRACTION PHACO AND INTRAOCULAR LENS PLACEMENT (IOC);  Surgeon: Estill Cotta, MD;  Location: [facility] ORS;  Service: Ophthalmology;  Laterality: Left;  [place]   1:02.2 AP     23.7 CDE   28.92 fluid pack lot# [id number] H exp.05/08/2018  . CHOLECYSTECTOMY    . ESOPHAGOGASTRODUODENOSCOPY N/A 10/12/2018   Procedure: ESOPHAGOGASTRODUODENOSCOPY (EGD);  Surgeon: Toledo, Benay Pike, MD;  Location: [facility] ENDOS[facility];  Service: Gastroenterology;  Laterality: N/A;  . ORIF FEMUR FRACTURE Left 11/17/2017   Procedure: OPEN REDUCTION INTERNAL FIXATION (ORIF) DISTAL FEMUR FRACTURE;  Surgeon: Shona Needles, MD;  Location: [facility];  Service:  Orthopedics;  Laterality: Left;  . TONSILLECTOMY     Family History  Problem Relation Age of Onset  . COPD Mother   . Heart disease Mother   . Anemia Mother   . Heart disease Father   . COPD Father   . Anemia Sister   . Diabetes Maternal Grandmother   . Hypertension Paternal Grandfather    Social History   Tobacco Use  . Smoking status: Former Smoker    Packs/day: 3.00    Types: Cigarettes    Last attempt to quit: 1988    Years since quitting: 32.2  . Smokeless tobacco: Never Used  Substance Use Topics  . Alcohol use: No   Allergies  Allergen Reactions  . Other Other (See  Comments)    ILOSONE- Caused GI distress also  . Latex Hives  . Exenatide Nausea Only and Other (See Comments)    Byetta- Nausea and abdominal pain, also  . Garlic Other (See Comments)    Severe acid reflux  . Onion Other (See Comments)    Severe acid reflux  . Rosiglitazone Other (See Comments)    Avandia- Affected heart  . Erythromycin Nausea And Vomiting    GI DISTRESS   Prior to Admission medications   Medication Sig Start Date End Date Taking? Authorizing Provider  acetaminophen (TYLENOL) 500 MG tablet Take 1,000 mg by mouth 2 (two) times daily as needed for mild pain, fever or headache.    Yes [provider]  albuterol (PROAIR HFA) 108 (90 Base) MCG/ACT inhaler Inhale 2 puffs into the lungs every 4 (four) hours as needed for wheezing or shortness of breath.  03/14/17  Yes [provider]  amiodarone (PACERONE) 200 MG tablet Take 200 mg by mouth daily. 05/10/17  Yes [provider]  Biotin 10 MG CAPS Take 1 capsule by mouth daily.    Yes [provider]  Boswellia Serrata (BOSWELLIA PO) Take 1 capsule by mouth 2 (two) times daily.    Yes [provider]  Calcium Carbonate-Vitamin D3 (CALCIUM 600-D) 600-400 MG-UNIT TABS Take 1 tablet by mouth 3 (three) times daily with meals.    Yes [provider]  cetirizine (ZYRTEC) 10 MG tablet Take 10 mg by mouth daily.   Yes [provider]  Cholecalciferol (D3 HIGH POTENCY) 125 MCG (5000 UT) capsule Take 5,000 Units by mouth daily.    Yes [provider]  citalopram (CELEXA) 40 MG tablet Take 40 mg by mouth daily.    Yes [provider]  clonazePAM (KLONOPIN) 1 MG tablet Take 1 tablet by mouth 2 (two) times daily as needed for anxiety.  05/17/18  Yes [provider]  cloNIDine (CATAPRES) 0.1 MG tablet Take 1 tablet (0.1 mg total) by mouth 2 (two) times daily. 07/12/17  Yes Egon Dittus, Otila Kluver A, FNP  Co-Enzyme Q-10 100 MG CAPS Take 100 mg by mouth daily.    Yes  [provider]  cyclobenzaprine (FLEXERIL) 5 MG tablet Take 5 mg by mouth 3 (three) times daily as needed for muscle spasms.   Yes [provider]  docusate sodium (COLACE) 50 MG capsule Take 100-200 mg by mouth 2 (two) times daily. 200mg  in the morning and 100mg  in the evening   Yes [provider]  Fluticasone-Salmeterol (WIXELA INHUB) 250-50 MCG/DOSE AEPB Inhale 1 puff into the lungs 2 (two) times daily.   Yes [provider]  gabapentin (NEURONTIN) 100 MG capsule Take 100 mg by mouth 3 (three) times daily.  Yes [provider]  Garlic 1610 MG CAPS Take 1,000 mg by mouth daily.    Yes [provider]  glimepiride (AMARYL) 2 MG tablet Take 2 mg by mouth daily with breakfast.   Yes [provider]  HYDROcodone-acetaminophen (NORCO) 10-325 MG tablet Take 1 tablet by mouth every 6 (six) hours as needed for severe pain.    Yes [provider]  Insulin Detemir (LEVEMIR FLEXTOUCH) 100 UNIT/ML Pen Inject 22 Units into the skin at bedtime.  03/01/18  Yes [provider]  insulin lispro (HUMALOG) 100 UNIT/ML injection Inject 15-20 Units into the skin 3 (three) times daily before meals.    Yes [provider]  levothyroxine (SYNTHROID, LEVOTHROID) 175 MCG tablet Take 175 mcg by mouth every evening.    Yes [provider]  linagliptin (TRADJENTA) 5 MG TABS tablet take 1 tablet by mouth once daily 01/03/18  Yes [provider]  lovastatin (MEVACOR) 20 MG tablet Take 20 mg by mouth at bedtime.   Yes [provider]  magnesium oxide (MAG-OX) 400 MG tablet Take 400 mg by mouth 2 (two) times daily.   Yes [provider]  metolazone (ZAROXOLYN) 5 MG tablet Take 5 mg by mouth as needed (for a weight gain of 2 pounds overnight or 5 pounds in a week; MAX OF 5 TABLETS AT A TIME).  04/18/17  Yes [provider]  Multiple Vitamins-Minerals (CENTRUM WOMEN) TABS Take 1 tablet by mouth daily.     Yes [provider]  Omega-3 Fatty Acids (FISH OIL) 1000 MG CPDR Take 1 capsule by mouth daily.   Yes [provider]  pantoprazole (PROTONIX) 40 MG tablet Take 1 tablet (40 mg total) by mouth 2 (two) times daily before a meal. 10/12/18  Yes Fritzi Mandes, MD  potassium chloride SA (K-DUR,KLOR-CON) 20 MEQ tablet Take 2 tablets (40 mEq total) by mouth daily. Patient taking differently: Take 20 mEq by mouth 2 (two) times daily. Take 20 mEq twice daily and double dose when taking Metolazole 06/07/18  Yes Jovanne Riggenbach A, FNP  senna (SENOKOT) 8.6 MG TABS tablet Take 8.6-17.2 mg by mouth 2 (two) times daily. 17.2mg  (2 tablets) in the morning and 8.6mg  in the evening   Yes [provider]  torsemide (DEMADEX) 20 MG tablet TAKE 2 TABLETS BY MOUTH TWICE A DAY Patient taking differently: Take 40 mg by mouth 2 (two) times daily.  09/07/18  Yes Adoria Kawamoto, Otila Kluver A, FNP  vitamin C (ASCORBIC ACID) 500 MG tablet Take 500 mg by mouth daily.   Yes [provider]  warfarin (COUMADIN) 5 MG tablet Take 5 mg by mouth daily at 6 PM.  04/22/17  Yes [provider]  predniSONE (DELTASONE) 20 MG tablet Take 20 mg by mouth as directed. 10/10/18   [provider]    Review of Systems  Constitutional: Positive for fatigue. Negative for appetite change.  HENT: Negative for congestion, postnasal drip and sore throat.   Eyes: Positive for visual disturbance (blurry vision in left eye). Negative for pain.  Respiratory: Positive for shortness of breath. Negative for cough and chest tightness.   Cardiovascular: Positive for leg swelling ("better"). Negative for chest pain and palpitations.  Gastrointestinal: Negative for abdominal distention and abdominal pain.  Endocrine: Negative.   Genitourinary: Negative.   Musculoskeletal: Positive for arthralgias (right shoulder) and back pain.  Skin: Negative.   Allergic/Immunologic: Negative.   Neurological: Positive for weakness and  light-headedness (due to vertigo). Negative for dizziness.  Hematological: Negative for adenopathy. Bruises/bleeds easily.  Psychiatric/Behavioral: Positive for dysphoric mood. Negative for sleep disturbance (sleeping on 6 pillows due to comfort). The patient is not nervous/anxious.    Vitals:   10/19/18 0945  BP: (!) 101/43  Pulse: 62  Resp: 18  SpO2: 100%  Weight: (!) 324 lb 4 oz (147.1 kg)  Height: 5\' 5"  (1.651 m)   Wt Readings from Last 3 Encounters:  10/19/18 (!) 324 lb 4 oz (147.1 kg)  10/16/18 (!) 321 lb 6.2 oz (145.8 kg)  10/10/18 (!) 326 lb 5 oz (148 kg)   Lab Results  Component Value Date   CREATININE 1.81 (H) 10/16/2018   CREATININE 2.24 (H) 10/15/2018   CREATININE 2.17 (H) 10/14/2018    Physical Exam  Constitutional: She is oriented to person, place, and time. She appears well-developed and well-nourished.  HENT:  Head: Normocephalic and atraumatic.  Neck: Normal range of motion. Neck supple. No JVD present.  Cardiovascular: Normal rate and regular rhythm.  Pulmonary/Chest: Effort normal. She has no wheezes. She has no rales.  Abdominal: Soft. She exhibits no distension. There is no abdominal tenderness.  Musculoskeletal:        General: Edema (2+ pitting edema in bilateral lower legs) present. No tenderness.  Neurological: She is alert and oriented to person, place, and time.  Skin: Skin is warm and dry.  Psychiatric: She has a normal mood and affect. Her behavior is normal. Thought content normal.  Nursing note and vitals reviewed.   Assessment & Plan:  1: Chronic heart failure with preserved ejection fraction- - NYHA class III - euvolemic today - weighing daily; reminded to call for an overnight weight gain of >2 pounds or a weekly weight gain of >5 pounds - weight up 5 pounds from last visit here 3 months ago - she took an extra torsemide this morning - not adding salt to her food  - saw cardiologist Ubaldo Glassing) 08/11/2018 & returns tomorrow - supposed to  be wearing oxygen at 2L but she is unable to get the tank from the car into her home and wants to try and get the smaller oxygen tanks. She says that [facility] no longer has DME in [place] and she's not going to [place] for oxygen tanks - gave her a list of home health agencies in the community and she is going to check to see who is in network with her insurance - patient has put PT on hold as she feels like she needs to "build up some strength first" - does not meet ReDS vest criteria due to BMI - BNP 10/12/2018 was 379.0 - reports receiving her flu vaccine for this season - PharmD reconciled medications with the patient  2: HTN- - BP looks good today although on the low side - BMP 10/16/2018 reviewed and showed sodium 139, potassium 3.5, creatinine 1.81 and GFR 29 - saw PCP Wadie Lessen) 10/10/2018  3: Diabetes- - A1c from 08/14/2018 was 7.8% - saw endocrinologist Honor Junes) 04/14/18 - sees nephrology Holley Raring) and she was instructed to call his office and get a sooner follow-up appointment   4: Lymphedema- - stage 2 - elevating them at times with some improvement; instructed to keep them elevated when sitting for long periods of time - unable to exercise much due to knee pain but does try to walk with her walker - unable to wear compression socks due to inability to get them on - patient says that she's supposed to be having someone come to  the house beginning of next week to get measured for compression boots  Patient did not bring her medications nor a list. Each medication was verbally reviewed with the patient and she was encouraged to bring the bottles to every visit to confirm accuracy of list.  Return in 3 months or sooner for any questions/ problems before then.

## 2018-10-19 ENCOUNTER — Ambulatory Visit: Payer: Medicare Other | Attending: Family | Admitting: Family

## 2018-10-19 ENCOUNTER — Other Ambulatory Visit: Payer: Self-pay

## 2018-10-19 ENCOUNTER — Encounter: Payer: Self-pay | Admitting: Pharmacist

## 2018-10-19 ENCOUNTER — Encounter: Payer: Self-pay | Admitting: Family

## 2018-10-19 VITALS — BP 101/43 | HR 62 | Resp 18 | Ht 65.0 in | Wt 324.2 lb

## 2018-10-19 DIAGNOSIS — Z91040 Latex allergy status: Secondary | ICD-10-CM | POA: Insufficient documentation

## 2018-10-19 DIAGNOSIS — E039 Hypothyroidism, unspecified: Secondary | ICD-10-CM | POA: Diagnosis not present

## 2018-10-19 DIAGNOSIS — I10 Essential (primary) hypertension: Secondary | ICD-10-CM

## 2018-10-19 DIAGNOSIS — I4891 Unspecified atrial fibrillation: Secondary | ICD-10-CM | POA: Insufficient documentation

## 2018-10-19 DIAGNOSIS — Z79890 Hormone replacement therapy (postmenopausal): Secondary | ICD-10-CM | POA: Diagnosis not present

## 2018-10-19 DIAGNOSIS — Z9981 Dependence on supplemental oxygen: Secondary | ICD-10-CM | POA: Insufficient documentation

## 2018-10-19 DIAGNOSIS — Z79899 Other long term (current) drug therapy: Secondary | ICD-10-CM | POA: Insufficient documentation

## 2018-10-19 DIAGNOSIS — E1140 Type 2 diabetes mellitus with diabetic neuropathy, unspecified: Secondary | ICD-10-CM | POA: Insufficient documentation

## 2018-10-19 DIAGNOSIS — K219 Gastro-esophageal reflux disease without esophagitis: Secondary | ICD-10-CM | POA: Diagnosis not present

## 2018-10-19 DIAGNOSIS — I5032 Chronic diastolic (congestive) heart failure: Secondary | ICD-10-CM

## 2018-10-19 DIAGNOSIS — N184 Chronic kidney disease, stage 4 (severe): Secondary | ICD-10-CM

## 2018-10-19 DIAGNOSIS — N189 Chronic kidney disease, unspecified: Secondary | ICD-10-CM | POA: Diagnosis not present

## 2018-10-19 DIAGNOSIS — G4733 Obstructive sleep apnea (adult) (pediatric): Secondary | ICD-10-CM | POA: Insufficient documentation

## 2018-10-19 DIAGNOSIS — J449 Chronic obstructive pulmonary disease, unspecified: Secondary | ICD-10-CM | POA: Insufficient documentation

## 2018-10-19 DIAGNOSIS — E1122 Type 2 diabetes mellitus with diabetic chronic kidney disease: Secondary | ICD-10-CM | POA: Diagnosis not present

## 2018-10-19 DIAGNOSIS — Z8249 Family history of ischemic heart disease and other diseases of the circulatory system: Secondary | ICD-10-CM | POA: Insufficient documentation

## 2018-10-19 DIAGNOSIS — Z881 Allergy status to other antibiotic agents status: Secondary | ICD-10-CM | POA: Insufficient documentation

## 2018-10-19 DIAGNOSIS — I130 Hypertensive heart and chronic kidney disease with heart failure and stage 1 through stage 4 chronic kidney disease, or unspecified chronic kidney disease: Secondary | ICD-10-CM | POA: Insufficient documentation

## 2018-10-19 DIAGNOSIS — I890 Lymphedema, not elsewhere classified: Secondary | ICD-10-CM | POA: Diagnosis not present

## 2018-10-19 DIAGNOSIS — Z825 Family history of asthma and other chronic lower respiratory diseases: Secondary | ICD-10-CM | POA: Insufficient documentation

## 2018-10-19 DIAGNOSIS — F329 Major depressive disorder, single episode, unspecified: Secondary | ICD-10-CM | POA: Insufficient documentation

## 2018-10-19 DIAGNOSIS — Z87891 Personal history of nicotine dependence: Secondary | ICD-10-CM | POA: Insufficient documentation

## 2018-10-19 DIAGNOSIS — Z7951 Long term (current) use of inhaled steroids: Secondary | ICD-10-CM | POA: Insufficient documentation

## 2018-10-19 DIAGNOSIS — Z888 Allergy status to other drugs, medicaments and biological substances status: Secondary | ICD-10-CM | POA: Insufficient documentation

## 2018-10-19 DIAGNOSIS — Z794 Long term (current) use of insulin: Secondary | ICD-10-CM | POA: Insufficient documentation

## 2018-10-19 DIAGNOSIS — Z87442 Personal history of urinary calculi: Secondary | ICD-10-CM | POA: Insufficient documentation

## 2018-10-19 DIAGNOSIS — F419 Anxiety disorder, unspecified: Secondary | ICD-10-CM | POA: Insufficient documentation

## 2018-10-19 DIAGNOSIS — Z832 Family history of diseases of the blood and blood-forming organs and certain disorders involving the immune mechanism: Secondary | ICD-10-CM | POA: Insufficient documentation

## 2018-10-19 DIAGNOSIS — I509 Heart failure, unspecified: Secondary | ICD-10-CM | POA: Diagnosis present

## 2018-10-19 DIAGNOSIS — Z7901 Long term (current) use of anticoagulants: Secondary | ICD-10-CM | POA: Insufficient documentation

## 2018-10-19 NOTE — Progress Notes (Signed)
[facility] - PHARMACIST COUNSELING NOTE  ADHERENCE ASSESSMENT  Adherence strategy: keeps medications together   Do you ever forget to take your medication? [] Yes (1) [x] No (0)  Do you ever skip doses due to side effects? [] Yes (1) [x] No (0)  Do you have trouble affording your medicines? [] Yes (1) [x] No (0)  Are you ever unable to pick up your medication due to transportation difficulties? [] Yes (1) [x] No (0)  Do you ever stop taking your medications because you don't believe they are helping? [] Yes (1) [x] No (0)  Total score _0______    Recommendations given to patient about increasing adherence: None  Guideline-Directed Medical Therapy/Evidence Based Medicine    ACE/ARB/ARNI: none (HFpEF)   Beta Blocker: none (HFpEF)   Aldosterone Antagonist: none (HFpEF) Diuretic: torsemide 40 mg twice daily + metolazone 5 mg PRN    SUBJECTIVE   HPI: Here for follow up appointment. Has been in the hospital a few times since last visit. She had weight gain overnight and took an extra torsemide this morning.  Past Medical History:  Diagnosis Date  . Anemia   . Anxiety   . CHF (congestive heart failure) ([place])   . Chronic kidney disease    INSUFFICIENCY  . Complication of anesthesia    had to be intubated during cataract surgery   " I could not breath "  . COPD (chronic obstructive pulmonary disease) ([place])   . Diabetes mellitus without complication ([place])   . Dyspnea    DOE  . Dysrhythmia    A FIB  . Edema   . Fracture of distal femur ([place]) 11/2017   left   . GERD (gastroesophageal reflux disease)   . History of kidney stones   . Hypertension   . Hypothyroidism    ABLATION  . Iron deficiency anemia 03/31/2017  . Neuropathy   . Orthopnea   . Sleep apnea    CPAP  . Vertigo   . Wheezing         OBJECTIVE    ECHO: Date 10/13/18, EF 55-60%    BMP Latest Ref Rng & Units 10/16/2018 10/15/2018 10/14/2018  Glucose 70 - 99 mg/dL 135(H)  138(H) 159(H)  BUN 8 - 23 mg/dL 59(H) 59(H) 58(H)  Creatinine 0.44 - 1.00 mg/dL 1.81(H) 2.24(H) 2.17(H)  Sodium 135 - 145 mmol/L 139 139 141  Potassium 3.5 - 5.1 mmol/L 3.5 3.3(L) 3.5  Chloride 98 - 111 mmol/L 91(L) 90(L) 95(L)  CO2 22 - 32 mmol/L 37(H) 36(H) 36(H)  Calcium 8.9 - 10.3 mg/dL 8.7(L) 9.0 8.4(L)    ASSESSMENT 63 year old female with HFpEF. Still feels short of breath but thinks it could also be due to the bronchitis that she had when she was last in the hospital. She is trying to eat better to help with her diabetes. BP today looked good. She weighs daily and had weight gain overnight. She took an extra torsemide this morning and wanted to see if she needed to increase her dose.   PLAN Patient to continue current regimen. Encouraged her to call for any overnight weight gain of more than 2 pounds or more than 5 pounds in a week. We discussed her diabetes management as patient has been adjusting insulin herself per glucose levels. Encouraged her to write down her glucose readings, the time and any associated factors (e.g. fasting, post-meal, symptomatic, etc.) so that her provider could adjust her insulin based on her readings.    Time spent: 10  minutes  Abby Burna Mortimer, Pharm.D. 10/19/2018 11:48 AM    Current Outpatient Medications:  .  acetaminophen (TYLENOL) 500 MG tablet, Take 1,000 mg by mouth 2 (two) times daily as needed for mild pain, fever or headache. , Disp: , Rfl:  .  albuterol (PROAIR HFA) 108 (90 Base) MCG/ACT inhaler, Inhale 2 puffs into the lungs every 4 (four) hours as needed for wheezing or shortness of breath. , Disp: , Rfl:  .  amiodarone (PACERONE) 200 MG tablet, Take 200 mg by mouth daily., Disp: , Rfl: 0 .  Biotin 10 MG CAPS, Take 1 capsule by mouth daily. , Disp: , Rfl:  .  Boswellia Serrata (BOSWELLIA PO), Take 1 capsule by mouth 2 (two) times daily. , Disp: , Rfl:  .  Calcium Carbonate-Vitamin D3 (CALCIUM 600-D) 600-400 MG-UNIT TABS, Take 1 tablet  by mouth 3 (three) times daily with meals. , Disp: , Rfl:  .  cetirizine (ZYRTEC) 10 MG tablet, Take 10 mg by mouth daily., Disp: , Rfl:  .  Cholecalciferol (D3 HIGH POTENCY) 125 MCG (5000 UT) capsule, Take 5,000 Units by mouth daily. , Disp: , Rfl:  .  citalopram (CELEXA) 40 MG tablet, Take 40 mg by mouth daily. , Disp: , Rfl:  .  clonazePAM (KLONOPIN) 1 MG tablet, Take 1 tablet by mouth 2 (two) times daily as needed for anxiety. , Disp: , Rfl:  .  cloNIDine (CATAPRES) 0.1 MG tablet, Take 1 tablet (0.1 mg total) by mouth 2 (two) times daily., Disp: 60 tablet, Rfl: 5 .  Co-Enzyme Q-10 100 MG CAPS, Take 100 mg by mouth daily. , Disp: , Rfl:  .  cyclobenzaprine (FLEXERIL) 5 MG tablet, Take 5 mg by mouth 3 (three) times daily as needed for muscle spasms., Disp: , Rfl:  .  docusate sodium (COLACE) 50 MG capsule, Take 100-200 mg by mouth 2 (two) times daily. 200mg  in the morning and 100mg  in the evening, Disp: , Rfl:  .  Fluticasone-Salmeterol (WIXELA INHUB) 250-50 MCG/DOSE AEPB, Inhale 1 puff into the lungs 2 (two) times daily., Disp: , Rfl:  .  gabapentin (NEURONTIN) 100 MG capsule, Take 100 mg by mouth 3 (three) times daily., Disp: , Rfl:  .  Garlic 4650 MG CAPS, Take 1,000 mg by mouth daily. , Disp: , Rfl:  .  glimepiride (AMARYL) 2 MG tablet, Take 2 mg by mouth daily with breakfast., Disp: , Rfl:  .  HYDROcodone-acetaminophen (NORCO) 10-325 MG tablet, Take 1 tablet by mouth every 6 (six) hours as needed for severe pain. , Disp: , Rfl:  .  Insulin Detemir (LEVEMIR FLEXTOUCH) 100 UNIT/ML Pen, Inject 22 Units into the skin at bedtime. , Disp: , Rfl:  .  insulin lispro (HUMALOG) 100 UNIT/ML injection, Inject 15-20 Units into the skin 3 (three) times daily before meals. , Disp: , Rfl:  .  levothyroxine (SYNTHROID, LEVOTHROID) 175 MCG tablet, Take 175 mcg by mouth every evening. , Disp: , Rfl:  .  linagliptin (TRADJENTA) 5 MG TABS tablet, take 1 tablet by mouth once daily, Disp: , Rfl:  .  lovastatin  (MEVACOR) 20 MG tablet, Take 20 mg by mouth at bedtime., Disp: , Rfl:  .  magnesium oxide (MAG-OX) 400 MG tablet, Take 400 mg by mouth 2 (two) times daily., Disp: , Rfl:  .  metolazone (ZAROXOLYN) 5 MG tablet, Take 5 mg by mouth as needed (for a weight gain of 2 pounds overnight or 5 pounds in a week; MAX OF 5 TABLETS  AT A TIME). , Disp: , Rfl:  .  Multiple Vitamins-Minerals (CENTRUM WOMEN) TABS, Take 1 tablet by mouth daily. , Disp: , Rfl:  .  Omega-3 Fatty Acids (FISH OIL) 1000 MG CPDR, Take 1 capsule by mouth daily., Disp: , Rfl:  .  pantoprazole (PROTONIX) 40 MG tablet, Take 1 tablet (40 mg total) by mouth 2 (two) times daily before a meal., Disp: 60 tablet, Rfl: 1 .  potassium chloride SA (K-DUR,KLOR-CON) 20 MEQ tablet, Take 2 tablets (40 mEq total) by mouth daily. (Patient taking differently: Take 20 mEq by mouth 2 (two) times daily. Take 20 mEq twice daily and double dose when taking Metolazole), Disp: 180 tablet, Rfl: 3 .  predniSONE (DELTASONE) 20 MG tablet, Take 20 mg by mouth as directed., Disp: , Rfl:  .  senna (SENOKOT) 8.6 MG TABS tablet, Take 8.6-17.2 mg by mouth 2 (two) times daily. 17.2mg  (2 tablets) in the morning and 8.6mg  in the evening, Disp: , Rfl:  .  torsemide (DEMADEX) 20 MG tablet, TAKE 2 TABLETS BY MOUTH TWICE A DAY (Patient taking differently: Take 40 mg by mouth 2 (two) times daily. ), Disp: 360 tablet, Rfl: 3 .  vitamin C (ASCORBIC ACID) 500 MG tablet, Take 500 mg by mouth daily., Disp: , Rfl:  .  warfarin (COUMADIN) 5 MG tablet, Take 5 mg by mouth daily at 6 PM. , Disp: , Rfl: 0   COUNSELING POINTS/CLINICAL PEARLS Torsemide  Side effects may include excessive urination.  Tell patient to report symptoms of ototoxicity.  Instruct patient to report lightheadedness or syncope.  Warn patient to avoid use of nonprescription NSAID products without first discussing it with their healthcare provider.   DRUGS TO AVOID IN HEART FAILURE  Drug or Class Mechanism   Analgesics . NSAIDs . COX-2 inhibitors . Glucocorticoids  Sodium and water retention, increased systemic vascular resistance, decreased response to diuretics   Diabetes Medications . Metformin . Thiazolidinediones o Rosiglitazone (Avandia) o Pioglitazone (Actos) . DPP4 Inhibitors o Saxagliptin ([place]nglyza) o Sitagliptin (Januvia)   Lactic acidosis Possible calcium channel blockade   Unknown  Antiarrhythmics . Class I  o Flecainide o Disopyramide . Class III o Sotalol . Other o Dronedarone  Negative inotrope, proarrhythmic   Proarrhythmic, beta blockade  Negative inotrope  Antihypertensives . Alpha Blockers o Doxazosin . Calcium Channel Blockers o Diltiazem o Verapamil o Nifedipine . Central Alpha Adrenergics o Moxonidine . Peripheral Vasodilators o Minoxidil  Increases renin and aldosterone  Negative inotrope    Possible sympathetic withdrawal  Unknown  Anti-infective . Itraconazole . Amphotericin B  Negative inotrope Unknown  Hematologic . Anagrelide . Cilostazol   Possible inhibition of PD IV Inhibition of PD III causing arrhythmias  Neurologic/Psychiatric . Stimulants . Anti-Seizure Drugs o Carbamazepine o Pregabalin . Antidepressants o Tricyclics o Citalopram . Parkinsons o Bromocriptine o Pergolide o Pramipexole . Antipsychotics o Clozapine . Antimigraine o Ergotamine o Methysergide . Appetite suppressants . Bipolar o Lithium  Peripheral alpha and beta agonist activity  Negative inotrope and chronotrope Calcium channel blockade  Negative inotrope, proarrhythmic Dose-dependent QT prolongation  Excessive serotonin activity/valvular damage Excessive serotonin activity/valvular damage Unknown  IgE mediated hypersensitivy, calcium channel blockade  Excessive serotonin activity/valvular damage Excessive serotonin activity/valvular damage Valvular damage  Direct myofibrillar degeneration, adrenergic stimulation   Antimalarials . Chloroquine . Hydroxychloroquine Intracellular inhibition of lysosomal enzymes  Urologic Agents . Alpha Blockers o Doxazosin o Prazosin o Tamsulosin o Terazosin  Increased renin and aldosterone  Adapted from Page RL, et al. "  Drugs That May Cause or Exacerbate Heart Failure: A Scientific Statement from the American Heart  Association." Circulation 2016; 435:W86-H68. DOI: 10.1161/CIR.0000000000000426   MEDICATION ADHERENCES TIPS AND STRATEGIES 1. Taking medication as prescribed improves patient outcomes in heart failure (reduces hospitalizations, improves symptoms, increases survival) 2. Side effects of medications can be managed by decreasing doses, switching agents, stopping drugs, or adding additional therapy. Please let someone in the [facility] know if you have having bothersome side effects so we can modify your regimen. Do not alter your medication regimen without talking to [place].  3. Medication reminders can help patients remember to take drugs on time. If you are missing or forgetting doses you can try linking behaviors, using pill boxes, or an electronic reminder like an alarm on your phone or an app. Some people can also get automated phone calls as medication reminders.

## 2018-10-19 NOTE — Patient Instructions (Signed)
Continue weighing daily and call for an overnight weight gain of > 2 pounds or a weekly weight gain of >5 pounds.  Home health agencies in the community so you can check with your insurance about who is covered: - [facility]

## 2018-11-06 ENCOUNTER — Emergency Department
Admission: EM | Admit: 2018-11-06 | Discharge: 2018-11-06 | Disposition: A | Discharge status: Home / Self Care | Payer: Medicare Other | Attending: Emergency Medicine | Admitting: Emergency Medicine

## 2018-11-06 ENCOUNTER — Other Ambulatory Visit: Payer: Self-pay

## 2018-11-06 ENCOUNTER — Emergency Department: Payer: Medicare Other

## 2018-11-06 ENCOUNTER — Encounter: Payer: Self-pay | Admitting: Emergency Medicine

## 2018-11-06 DIAGNOSIS — R109 Unspecified abdominal pain: Secondary | ICD-10-CM

## 2018-11-06 DIAGNOSIS — I5033 Acute on chronic diastolic (congestive) heart failure: Secondary | ICD-10-CM | POA: Diagnosis not present

## 2018-11-06 DIAGNOSIS — Z87891 Personal history of nicotine dependence: Secondary | ICD-10-CM | POA: Insufficient documentation

## 2018-11-06 DIAGNOSIS — E039 Hypothyroidism, unspecified: Secondary | ICD-10-CM | POA: Diagnosis not present

## 2018-11-06 DIAGNOSIS — I130 Hypertensive heart and chronic kidney disease with heart failure and stage 1 through stage 4 chronic kidney disease, or unspecified chronic kidney disease: Secondary | ICD-10-CM | POA: Diagnosis not present

## 2018-11-06 DIAGNOSIS — Z7901 Long term (current) use of anticoagulants: Secondary | ICD-10-CM | POA: Insufficient documentation

## 2018-11-06 DIAGNOSIS — Z79899 Other long term (current) drug therapy: Secondary | ICD-10-CM | POA: Diagnosis not present

## 2018-11-06 DIAGNOSIS — E119 Type 2 diabetes mellitus without complications: Secondary | ICD-10-CM | POA: Insufficient documentation

## 2018-11-06 DIAGNOSIS — N189 Chronic kidney disease, unspecified: Secondary | ICD-10-CM | POA: Insufficient documentation

## 2018-11-06 DIAGNOSIS — K5900 Constipation, unspecified: Secondary | ICD-10-CM | POA: Diagnosis present

## 2018-11-06 LAB — LIPASE, BLOOD: Lipase: 53 U/L — ABNORMAL HIGH (ref 11–51)

## 2018-11-06 LAB — COMPREHENSIVE METABOLIC PANEL
ALT: 15 U/L (ref 0–44)
AST: 23 U/L (ref 15–41)
Albumin: 3.9 g/dL (ref 3.5–5.0)
Alkaline Phosphatase: 80 U/L (ref 38–126)
Anion gap: 15 (ref 5–15)
BILIRUBIN TOTAL: 0.6 mg/dL (ref 0.3–1.2)
BUN: 45 mg/dL — ABNORMAL HIGH (ref 8–23)
CALCIUM: 8.9 mg/dL (ref 8.9–10.3)
CO2: 30 mmol/L (ref 22–32)
Chloride: 98 mmol/L (ref 98–111)
Creatinine, Ser: 2.02 mg/dL — ABNORMAL HIGH (ref 0.44–1.00)
GFR calc Af Amer: 30 mL/min — ABNORMAL LOW (ref >60)
GFR calc non Af Amer: 26 mL/min — ABNORMAL LOW (ref >60)
Glucose, Bld: 188 mg/dL — ABNORMAL HIGH (ref 70–99)
Potassium: 4.4 mmol/L (ref 3.5–5.1)
Sodium: 143 mmol/L (ref 135–145)
Total Protein: 8.4 g/dL — ABNORMAL HIGH (ref 6.5–8.1)

## 2018-11-06 LAB — CBC
HCT: 42.7 % (ref 36.0–46.0)
Hemoglobin: 12.9 g/dL (ref 12.0–15.0)
MCH: 26.5 pg (ref 26.0–34.0)
MCHC: 30.2 g/dL (ref 30.0–36.0)
MCV: 87.9 fL (ref 80.0–100.0)
NRBC: 0 % (ref 0.0–0.2)
PLATELETS: 228 10*3/uL (ref 150–400)
RBC: 4.86 MIL/uL (ref 3.87–5.11)
RDW: 17.9 % — ABNORMAL HIGH (ref 11.5–15.5)
WBC: 14.7 10*3/uL — ABNORMAL HIGH (ref 4.0–10.5)

## 2018-11-06 MED ORDER — LORAZEPAM 0.5 MG PO TABS
0.5000 mg | ORAL_TABLET | Freq: Once | ORAL | Status: AC
  Administered 2018-11-06: 0.5 mg via ORAL
  Filled 2018-11-06: qty 1

## 2018-11-06 MED ORDER — SODIUM CHLORIDE 0.9% FLUSH: 3.0000 mL | Freq: Once | INTRAVENOUS | Status: DC

## 2018-11-06 NOTE — ED Provider Notes (Signed)
[facility] Emergency Department Provider Note  Time seen: 8:17 PM  I have reviewed the triage vital signs and the nursing notes.   HISTORY  Chief Complaint Constipation   HPI Andrea Rivers is a 63 y.o. female with a past medical history of anemia, anxiety, CHF, CKD, COPD, diabetes, A. fib, gastric reflux, hypertension, obesity, presents to the emergency department for constipation.  According to the patient she normally has 2 bowel movements per day, take stool softeners in the morning as well as evening every day.  States she has not had a bowel movement and 2-1/2 days which is very abnormal.  States she is experiencing pain around her rectum and feels like there is hard stool at the rectum but she cannot pass it.  Patient states a history of rectal impaction in the past which this feels similar.  Denies any nausea or vomiting.   Past Medical History:  Diagnosis Date  . Anemia   . Anxiety   . CHF (congestive heart failure) ([place])   . Chronic kidney disease    INSUFFICIENCY  . Complication of anesthesia    had to be intubated during cataract surgery   " I could not breath "  . COPD (chronic obstructive pulmonary disease) ([place])   . Diabetes mellitus without complication ([place])   . Dyspnea    DOE  . Dysrhythmia    A FIB  . Edema   . Fracture of distal femur ([place]) 11/2017   left   . GERD (gastroesophageal reflux disease)   . History of kidney stones   . Hypertension   . Hypothyroidism    ABLATION  . Iron deficiency anemia 03/31/2017  . Neuropathy   . Orthopnea   . Sleep apnea    CPAP  . Vertigo   . Wheezing     Patient Active Problem List   Diagnosis Date Noted  . CHF (congestive heart failure) ([place]) 10/15/2018  . Acute on chronic diastolic CHF (congestive heart failure) ([place]) 10/13/2018  . Hypokalemia 06/08/2018  . Lymphedema 06/08/2018  . Closed displaced supracondylar fracture of distal end of left femur with intracondylar extension ([place])  11/17/2017  . CKD (chronic kidney disease) 11/14/2017  . Hypothyroidism 11/14/2017  . Atrial fibrillation ([place]) 11/14/2017  . Anxiety state 11/14/2017  . Peripheral neuropathy 11/14/2017  . Bradycardia 08/13/2017  . Microcytic anemia 03/31/2017  . Iron deficiency anemia 03/31/2017  . Chronic diastolic heart failure ([place]) 03/10/2017  . HTN (hypertension) 03/10/2017  . Diabetes ([place]) 03/10/2017  . Obstructive sleep apnea 03/10/2017    Past Surgical History:  Procedure Laterality Date  . CATARACT EXTRACTION W/PHACO Left 01/26/2017   Procedure: CATARACT EXTRACTION PHACO AND INTRAOCULAR LENS PLACEMENT (IOC);  Surgeon: Estill Cotta, MD;  Location: [facility] ORS;  Service: Ophthalmology;  Laterality: Left;  [place]   1:02.2 AP     23.7 CDE   28.92 fluid pack lot# [id number] H exp.05/08/2018  . CHOLECYSTECTOMY    . ESOPHAGOGASTRODUODENOSCOPY N/A 10/12/2018   Procedure: ESOPHAGOGASTRODUODENOSCOPY (EGD);  Surgeon: Toledo, Benay Pike, MD;  Location: [facility] ENDOSCOPY;  Service: Gastroenterology;  Laterality: N/A;  . ORIF FEMUR FRACTURE Left 11/17/2017   Procedure: OPEN REDUCTION INTERNAL FIXATION (ORIF) DISTAL FEMUR FRACTURE;  Surgeon: Shona Needles, MD;  Location: [facility];  Service: Orthopedics;  Laterality: Left;  . TONSILLECTOMY      Prior to Admission medications   Medication Sig Start Date End Date Taking? Authorizing Provider  acetaminophen (TYLENOL) 500 MG tablet Take 1,000 mg by mouth 2 (  two) times daily as needed for mild pain, fever or headache.     [provider]  albuterol (PROAIR HFA) 108 (90 Base) MCG/ACT inhaler Inhale 2 puffs into the lungs every 4 (four) hours as needed for wheezing or shortness of breath.  03/14/17   [provider]  amiodarone (PACERONE) 200 MG tablet Take 200 mg by mouth daily. 05/10/17   [provider]  Biotin 10 MG CAPS Take 1 capsule by mouth daily.     [provider]  Azucena Freed Serrata (BOSWELLIA PO) Take 1 capsule by mouth 2 (two)  times daily.     [provider]  Calcium Carbonate-Vitamin D3 (CALCIUM 600-D) 600-400 MG-UNIT TABS Take 1 tablet by mouth 3 (three) times daily with meals.     [provider]  cetirizine (ZYRTEC) 10 MG tablet Take 10 mg by mouth daily.    [provider]  Cholecalciferol (D3 HIGH POTENCY) 125 MCG (5000 UT) capsule Take 5,000 Units by mouth daily.     [provider]  citalopram (CELEXA) 40 MG tablet Take 40 mg by mouth daily.     [provider]  clonazePAM (KLONOPIN) 1 MG tablet Take 1 tablet by mouth 2 (two) times daily as needed for anxiety.  05/17/18   [provider]  cloNIDine (CATAPRES) 0.1 MG tablet Take 1 tablet (0.1 mg total) by mouth 2 (two) times daily. 07/12/17   Alisa Graff, FNP  Co-Enzyme Q-10 100 MG CAPS Take 100 mg by mouth daily.     [provider]  cyclobenzaprine (FLEXERIL) 5 MG tablet Take 5 mg by mouth 3 (three) times daily as needed for muscle spasms.    [provider]  docusate sodium (COLACE) 50 MG capsule Take 100-200 mg by mouth 2 (two) times daily. 200mg  in the morning and 100mg  in the evening    [provider]  Fluticasone-Salmeterol (WIXELA INHUB) 250-50 MCG/DOSE AEPB Inhale 1 puff into the lungs 2 (two) times daily.    [provider]  gabapentin (NEURONTIN) 100 MG capsule Take 100 mg by mouth 3 (three) times daily.    [provider]  Garlic 6503 MG CAPS Take 1,000 mg by mouth daily.     [provider]  glimepiride (AMARYL) 2 MG tablet Take 2 mg by mouth daily with breakfast.    [provider]  HYDROcodone-acetaminophen (NORCO) 10-325 MG tablet Take 1 tablet by mouth every 6 (six) hours as needed for severe pain.     [provider]  Insulin Detemir (LEVEMIR FLEXTOUCH) 100 UNIT/ML Pen Inject 22 Units into the skin at bedtime.  03/01/18   [provider]  insulin lispro (HUMALOG) 100 UNIT/ML injection Inject 15-20 Units into the  skin 3 (three) times daily before meals.     [provider]  levothyroxine (SYNTHROID, LEVOTHROID) 175 MCG tablet Take 175 mcg by mouth every evening.     [provider]  linagliptin (TRADJENTA) 5 MG TABS tablet take 1 tablet by mouth once daily 01/03/18   [provider]  lovastatin (MEVACOR) 20 MG tablet Take 20 mg by mouth at bedtime.    [provider]  magnesium oxide (MAG-OX) 400 MG tablet Take 400 mg by mouth 2 (two) times daily.    [provider]  metolazone (ZAROXOLYN) 5 MG tablet Take 5 mg by mouth as needed (for a weight gain of 2 pounds overnight or 5 pounds in a week; MAX OF 5 TABLETS AT A TIME).  04/18/17   [provider]  Multiple Vitamins-Minerals (CENTRUM WOMEN) TABS Take 1 tablet by mouth daily.     [provider]  Omega-3 Fatty Acids (FISH OIL) 1000 MG CPDR Take 1 capsule by mouth daily.    [provider]  pantoprazole (PROTONIX) 40 MG tablet Take 1 tablet (40 mg total) by mouth 2 (two) times daily before a meal. 10/12/18   Fritzi Mandes, MD  potassium chloride SA (K-DUR,KLOR-CON) 20 MEQ tablet Take 2 tablets (40 mEq total) by mouth daily. Patient taking differently: Take 20 mEq by mouth 2 (two) times daily. Take 20 mEq twice daily and double dose when taking Metolazole 06/07/18   Darylene Price A, FNP  predniSONE (DELTASONE) 20 MG tablet Take 20 mg by mouth as directed. 10/10/18   [provider]  senna (SENOKOT) 8.6 MG TABS tablet Take 8.6-17.2 mg by mouth 2 (two) times daily. 17.2mg  (2 tablets) in the morning and 8.6mg  in the evening    [provider]  torsemide (DEMADEX) 20 MG tablet TAKE 2 TABLETS BY MOUTH TWICE A DAY Patient taking differently: Take 40 mg by mouth 2 (two) times daily.  09/07/18   Alisa Graff, FNP  vitamin C (ASCORBIC ACID) 500 MG tablet Take 500 mg by mouth daily.    [provider]  warfarin (COUMADIN) 5 MG tablet Take 5 mg by mouth daily at 6 PM.  04/22/17    [provider]    Allergies  Allergen Reactions  . Other Other (See Comments)    ILOSONE- Caused GI distress also  . Latex Hives  . Exenatide Nausea Only and Other (See Comments)    [place]- Nausea and abdominal pain, also  . Garlic Other (See Comments)    Severe acid reflux  . Onion Other (See Comments)    Severe acid reflux  . Rosiglitazone Other (See Comments)    [place]and[place]- Affected heart  . Erythromycin Nausea And Vomiting    GI DISTRESS    Family History  Problem Relation Age of Onset  . COPD Mother   . Heart disease Mother   . Anemia Mother   . Heart disease Father   . COPD Father   . Anemia Sister   . Diabetes Maternal Grandmother   . Hypertension Paternal Grandfather     Social History Social History   Tobacco Use  . Smoking status: Former Smoker    Packs/day: 3.00    Types: Cigarettes    Last attempt to quit: 1988    Years since quitting: 32.2  . Smokeless tobacco: Never Used  Substance Use Topics  . Alcohol use: No  . Drug use: No    Review of Systems Constitutional: Negative for fever. Cardiovascular: Negative for chest pain. Respiratory: Negative for shortness of breath. Gastrointestinal: Negative for abdominal pain.  Does feel fullness/discomfort at the rectum.  Negative for vomiting Musculoskeletal: Negative for musculoskeletal complaints Skin: Negative for skin complaints  Neurological: Negative for headache All other ROS negative  ____________________________________________   PHYSICAL EXAM:  VITAL SIGNS: ED Triage Vitals  Enc Vitals Group     BP 11/06/18 1739 (!) 150/69     Pulse Rate 11/06/18 1736 71     Resp 11/06/18 1736 16     Temp 11/06/18 1736 98.4 F (36.9 C)     Temp Source 11/06/18 1736 Oral     SpO2 11/06/18 1736 99 %     Weight --      Height --  Head Circumference --      Peak Flow --      Pain Score 11/06/18 1737 10     Pain Loc --      Pain Edu? --      Excl. in [place]? --    Constitutional: Alert  and oriented. Well appearing and in no distress. Eyes: Normal exam ENT   Head: Normocephalic and atraumatic   Mouth/Throat: Mucous membranes are moist. Cardiovascular: Normal rate, regular rhythm.  Respiratory: Normal respiratory effort without tachypnea nor retractions. Breath sounds are clear  Gastrointestinal: Soft and nontender. No distention.   Musculoskeletal: Nontender with normal range of motion in all extremities.  Neurologic:  Normal speech and language. No gross focal neurologic deficits  Skin:  Skin is warm, dry and intact.  Psychiatric: Mood and affect are normal.   ____________________________________________    RADIOLOGY  IMPRESSION: Nonobstructive bowel gas pattern with mild fecal retention throughout the colon  ____________________________________________   INITIAL IMPRESSION / ASSESSMENT AND PLAN / ED COURSE  Pertinent labs & imaging results that were available during my care of the patient were reviewed by me and considered in my medical decision making (see chart for details).  Patient presents to the emergency department for constipation x2-1/2 days.  Feels fullness in her rectum feels like it could be a fecal impaction again.  Patient denies any vomiting.  Lab work is largely at baseline, chronic kidney disease, white blood cell count is mildly elevated however largely unchanged from prior results.  We will obtain a two-view abdominal x-ray to help rule out obstructive process.  We will perform a rectal examination once that is resulted to ensure there is no fecal impaction.  Patient agreeable plan of care.  X-ray negative for obstruction.  Performed fecal/rectal disimpaction with moderate amount of hard stool removed.  Liquid stool was also in the rectum.  We will discharge the patient home with PCP follow-up.  Patient did have a small amount of blood in her stool as well, she states that is from "straining to have a bowel movement."  I discussed with the  patient regardless she should follow-up with a GI specialist.  Patient agreeable to plan of care.  ____________________________________________   FINAL CLINICAL IMPRESSION(S) / ED DIAGNOSES  Constipation   Harvest Dark, MD 11/06/18 2227

## 2018-11-06 NOTE — ED Notes (Signed)
Patient transported to X-ray 

## 2018-11-06 NOTE — ED Notes (Addendum)
Introduced to pt, initial assessment completed. Pt undressed into gown. Provided with warm blanket and call bell is within reach. Awaits provider evaluation at this time.

## 2018-11-06 NOTE — ED Notes (Signed)
MD at bedside to disimpact pt. Pt tolerated well. RN at bedside as witness.

## 2018-11-06 NOTE — ED Triage Notes (Signed)
PT c/o abd pain, denies n/v/d. PT states hx of constipation, last BM was 2days ago. NAD noted. VSS

## 2018-11-06 NOTE — Discharge Instructions (Signed)
As we discussed please follow-up with GI medicine regarding blood in your stool tonight.  Please follow-up with your primary care doctor.  Return to the emergency department for any abdominal pain, fever, or any other symptom personally concerning to yourself.

## 2018-11-06 NOTE — ED Notes (Signed)
Pt verbalized understanding of d/c instructions, and f/u care. No further questions at this time. Pt assisted to the exit via wheelchair.

## 2018-11-17 ENCOUNTER — Other Ambulatory Visit: Payer: Self-pay | Admitting: Family Medicine

## 2018-11-17 NOTE — Telephone Encounter (Signed)
Please Review

## 2018-12-09 IMAGING — CT CT KNEE*L* W/O CM
3 series · 13 of 33 positions shown, 16 images · non-contrast
Comparison: 11/14/2017 radiographs

CLINICAL DATA: Patient tripped on comfort or falling on the left
knee. Femoral fracture.

EXAM:
CT OF THE LEFT KNEE WITHOUT CONTRAST
TECHNIQUE: Multidetector CT imaging of the LEFT knee was performed according to
the standard protocol. Multiplanar CT image reconstructions were
also generated.

[Series 4: extremity soft tissue · axial · 0.49mm/px · z∈[+534,+750]mm · 5 of 157 slices shown, 7 images]
[im 25/157  soft-tissue]
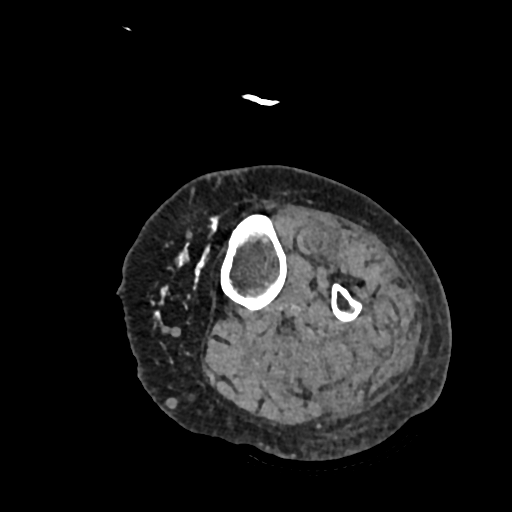
[im 25/157  bone]
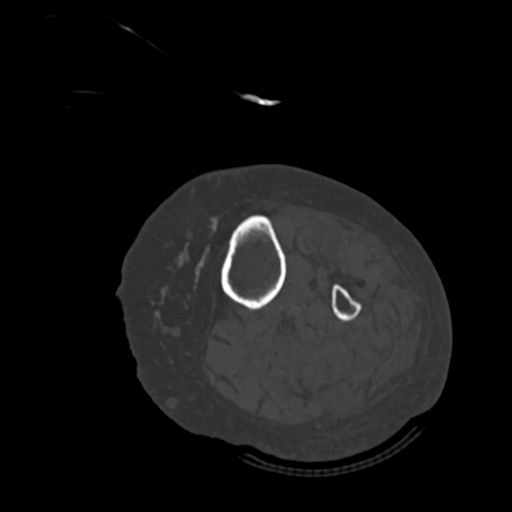
[im 49/157  bone]
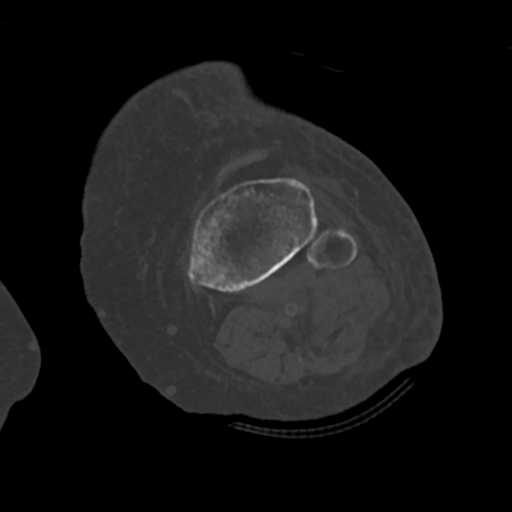
[im 85/157  bone]
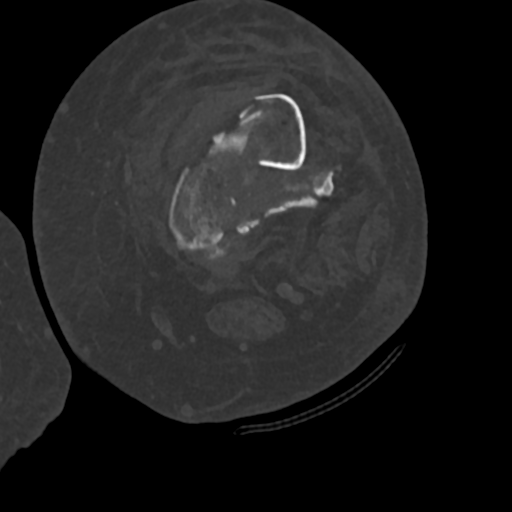
[im 109/157  bone]
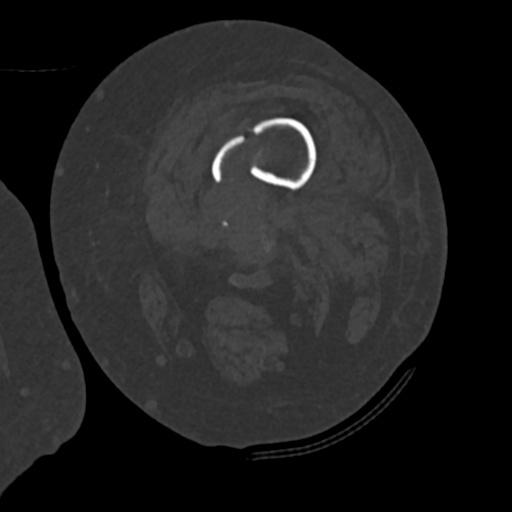
[im 133/157  soft-tissue]
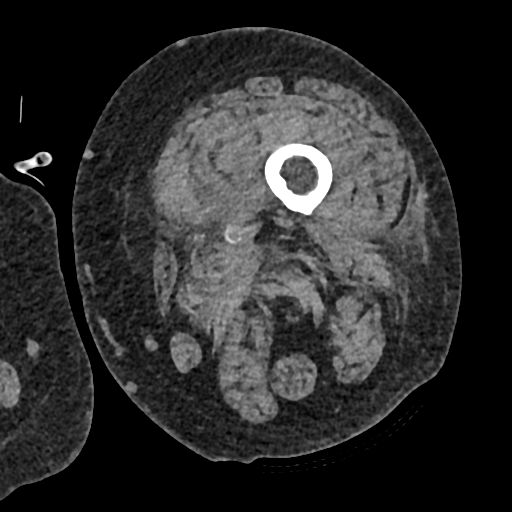
[im 133/157  bone]
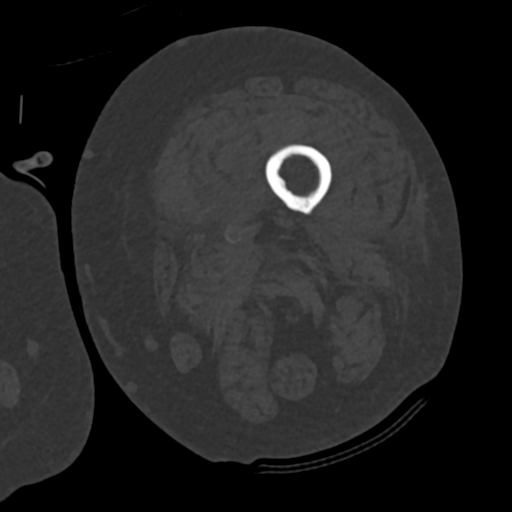

[Series 9: cor soft tissue · coronal · 0.42mm/px · 3 of 126 slices shown]
[im 26/126  bone]
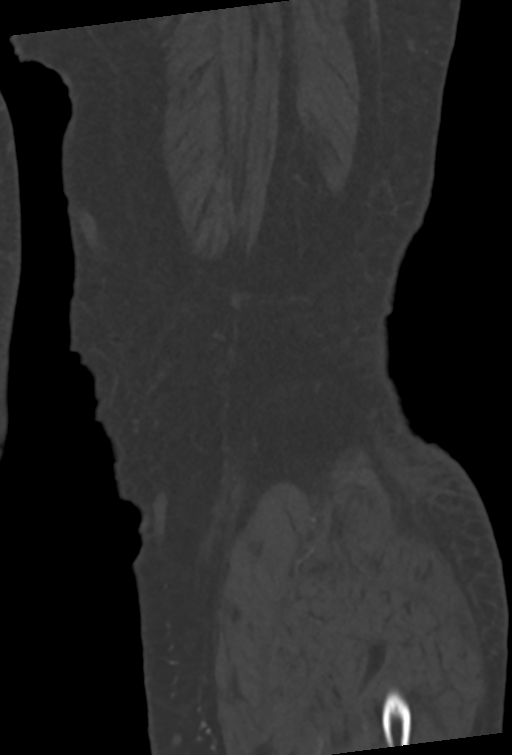
[im 51/126  bone]
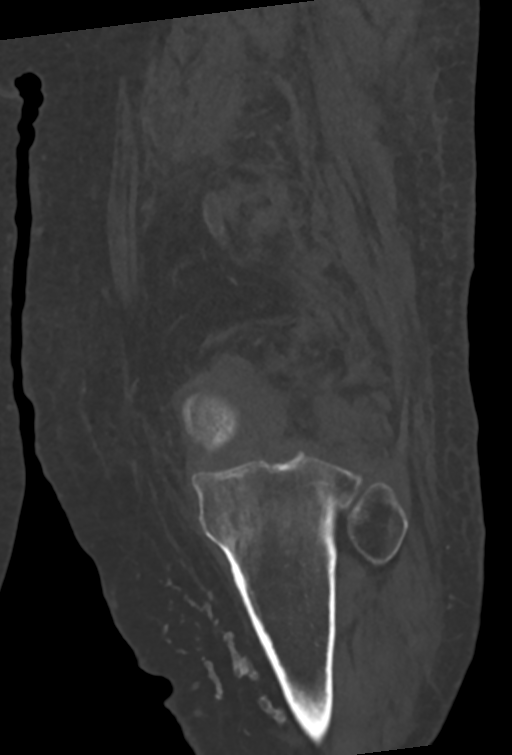
[im 76/126  bone]
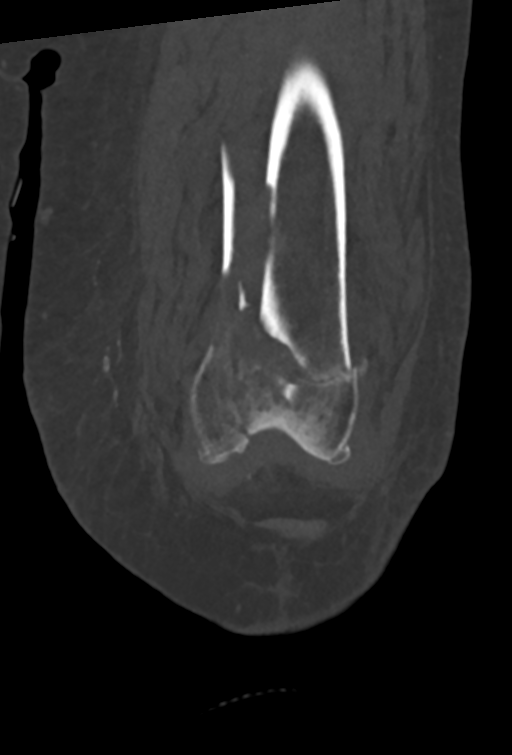

[Series 10: sag soft tissue · sagittal · 0.49mm/px · 5 of 113 slices shown, 6 images]
[im 38/113  bone]
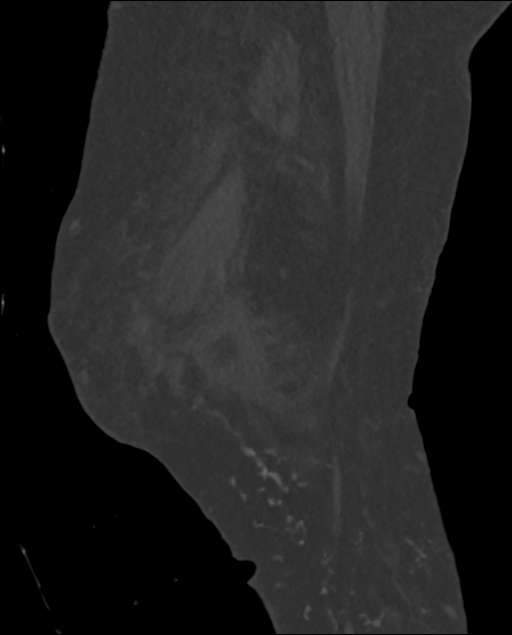
[im 47/113  bone]
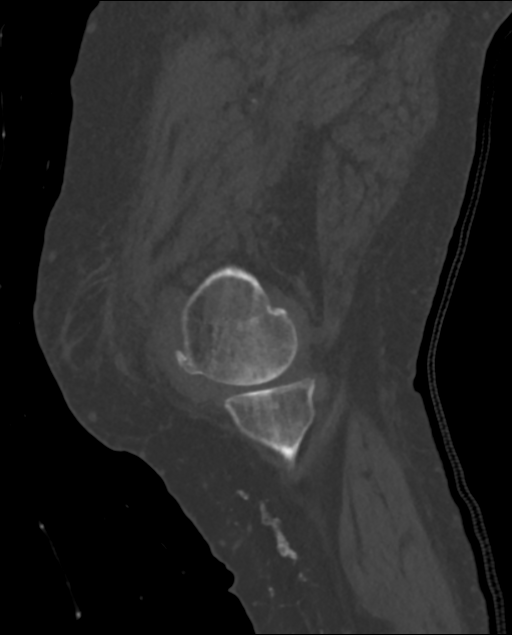
[im 57/113  soft-tissue]
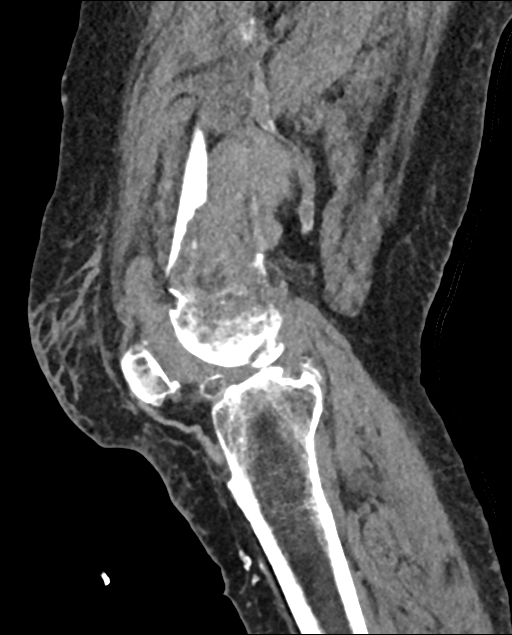
[im 57/113  bone]
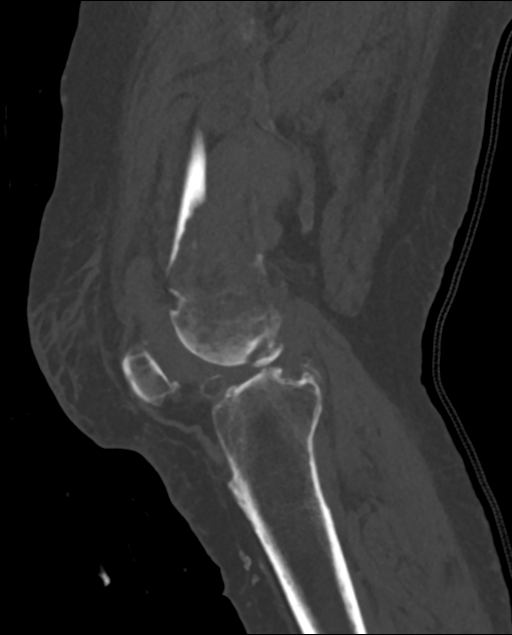
[im 66/113  bone]
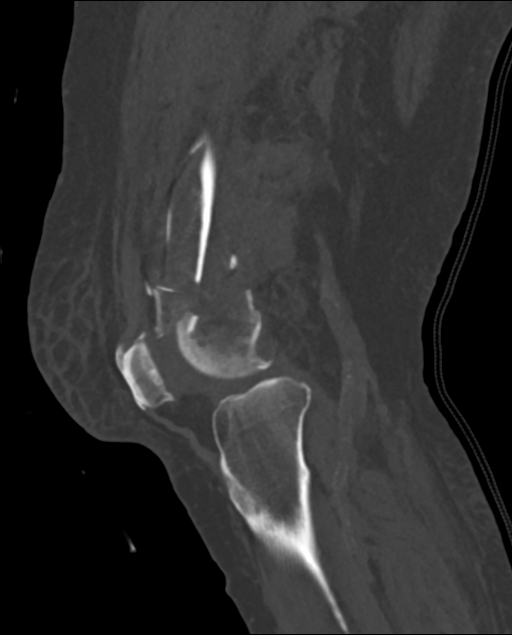
[im 75/113  bone]
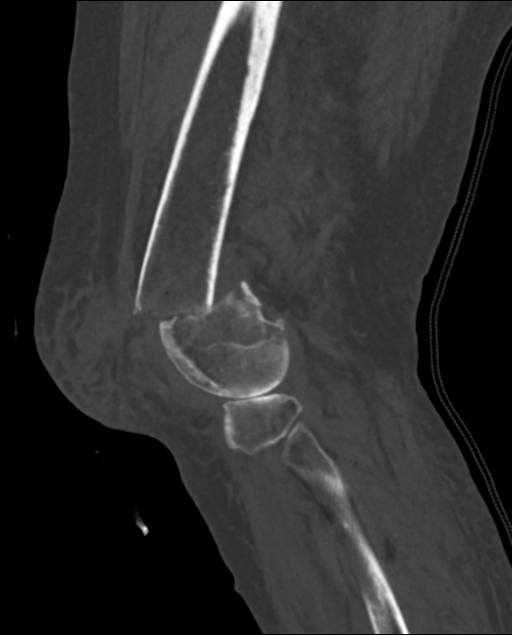

[13 of 33 positions shown; findings below may reference images not displayed]

FINDINGS: Bones/Joint/Cartilage

Acute comminuted supracondylar fracture of the left femoral
metaphysis is noted. An sagittal component is seen traversing the
medial femoral condyle along its medial aspect sparing the
intercondylar notch and extending into the knee joint. 3 mm of
offset is seen along the medial articular surface due to this
fracture. The femoral condyles are [DATE] to [DATE] shaft width dorsally
displaced relative to the femoral diaphysis.

There is a 2.3 x 0.9 x 2.8 cm fracture fragment in the suprapatellar
compartment of the knee, displaced anteriorly relative to the
lateral femoral condyle. Slight impaction of the femoral diaphysis
is noted on the femoral condyles. Small to moderate joint effusion
is noted. Degenerative subchondral cystic change of the patella and
trochlea. Osteoarthritis of the femorotibial compartment with near
bone-on-bone apposition of the lateral femoral condyle with lateral
tibial plateau. No tibial plateau fracture. The proximal fibula
appears intact.

Ligaments

Suboptimally assessed by CT.

Muscles and Tendons

No muscle atrophy.  No acute intramuscular hemorrhage.

Soft tissues

Periarticular soft tissue swelling more so along the anterolateral
subcutaneous soft tissues.
IMPRESSION: 1. Acute T-shaped supracondylar fracture of the left femur with
impaction of the femoral diaphysis on the femoral condyles, anterior
displacement between [DATE] to [DATE] shaft width and with sagittal
component traversing the medial femoral condyle just peripheral to
the intercondylar notch.
2. Associated small suprapatellar joint effusion.
3. Osteoarthritis of the patellofemoral and femorotibial
compartments.
4. Posttraumatic soft tissue swelling is seen about the
anterolateral aspect of the knee.

## 2018-12-11 IMAGING — RF DG C-ARM 61-120 MIN
1 series · 8 of 8 positions shown · non-contrast
Comparison: 11/15/2017

CLINICAL DATA: ORIF of distal femoral fracture

EXAM:
LEFT FEMUR 2 VIEWS; DG C-ARM 61-120 MIN

[Series 1: run · 8 of 8 slices shown]
[im 1/8]
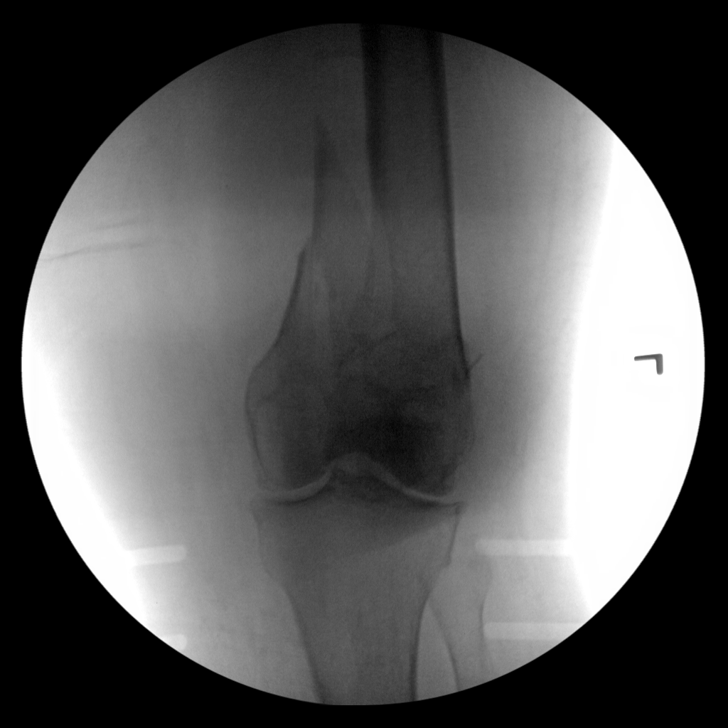
[im 2/8]
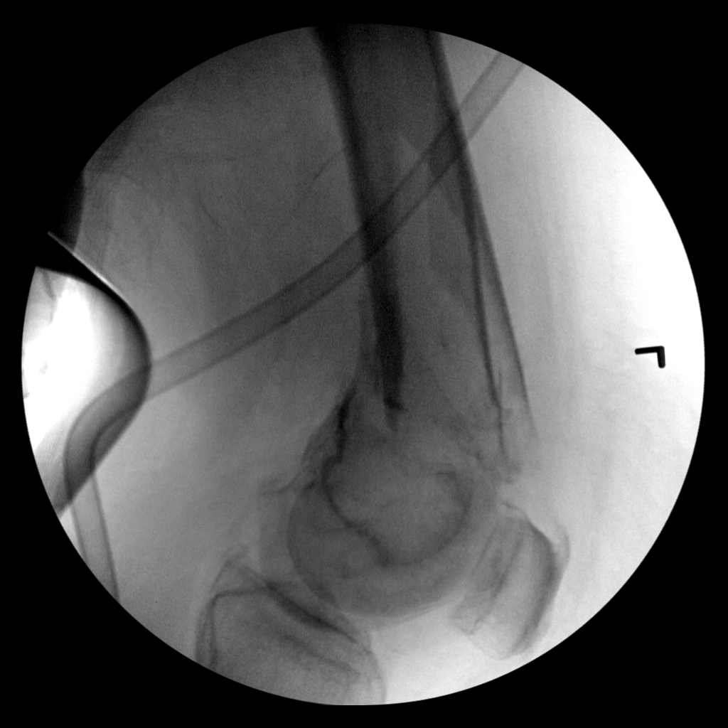
[im 3/8]
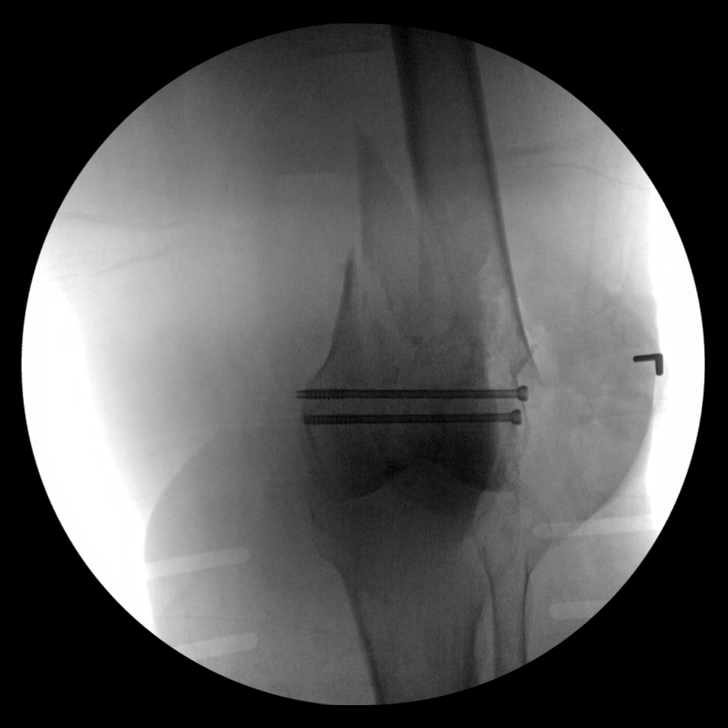
[im 4/8]
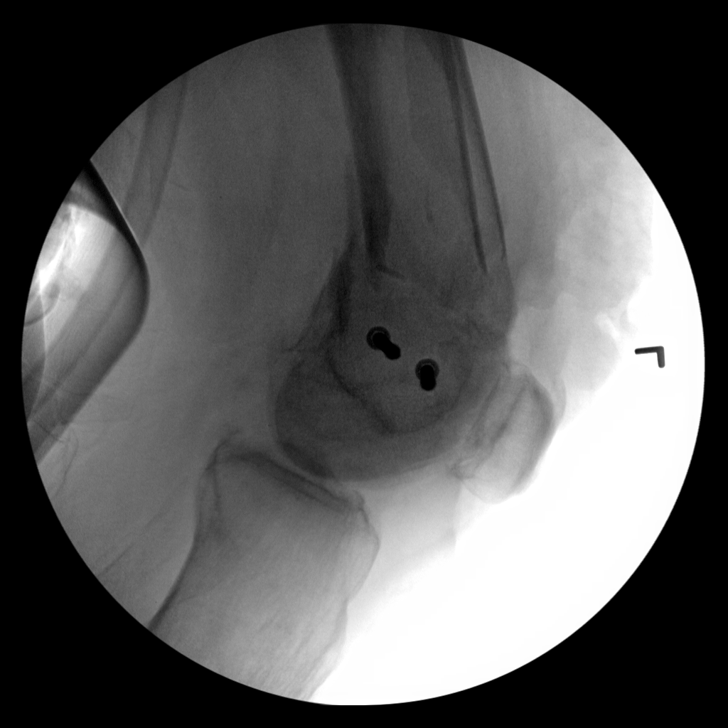
[im 5/8]
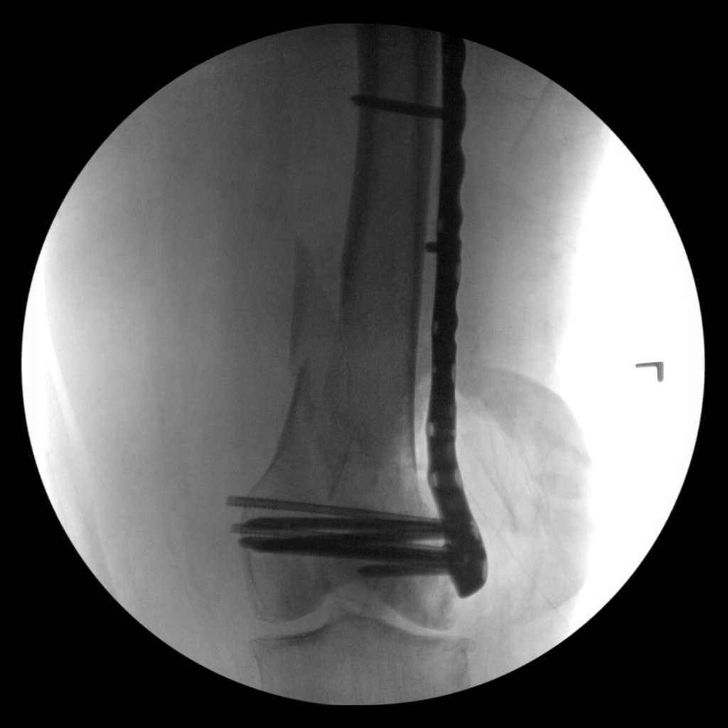
[im 6/8]
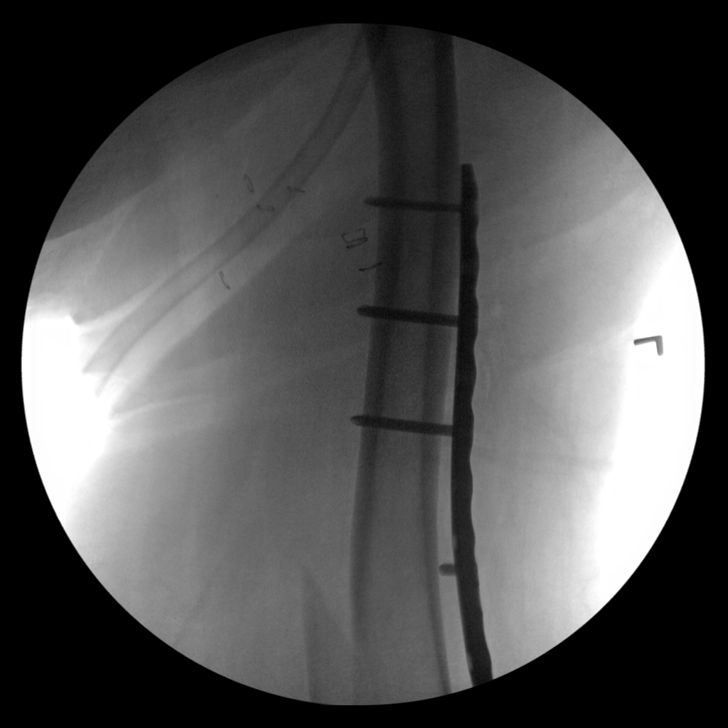
[im 7/8]
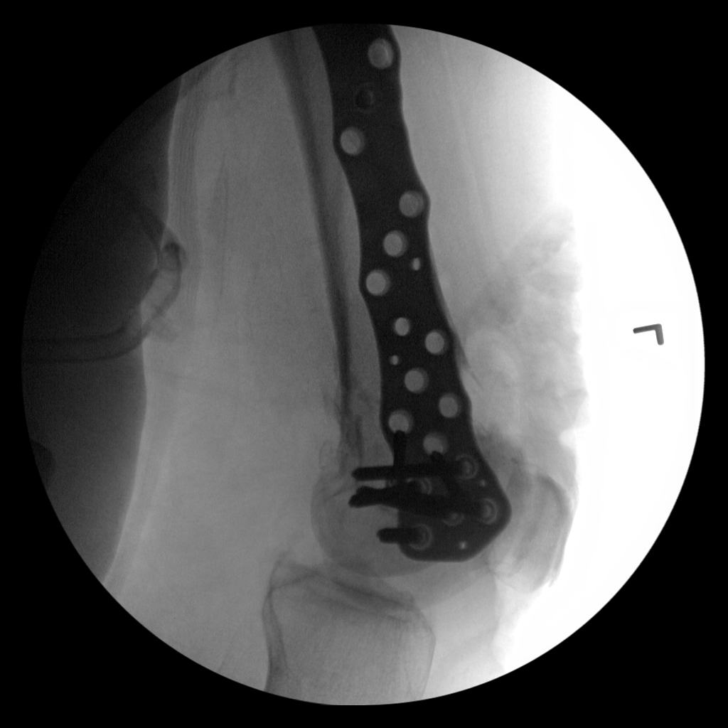
[im 8/8]
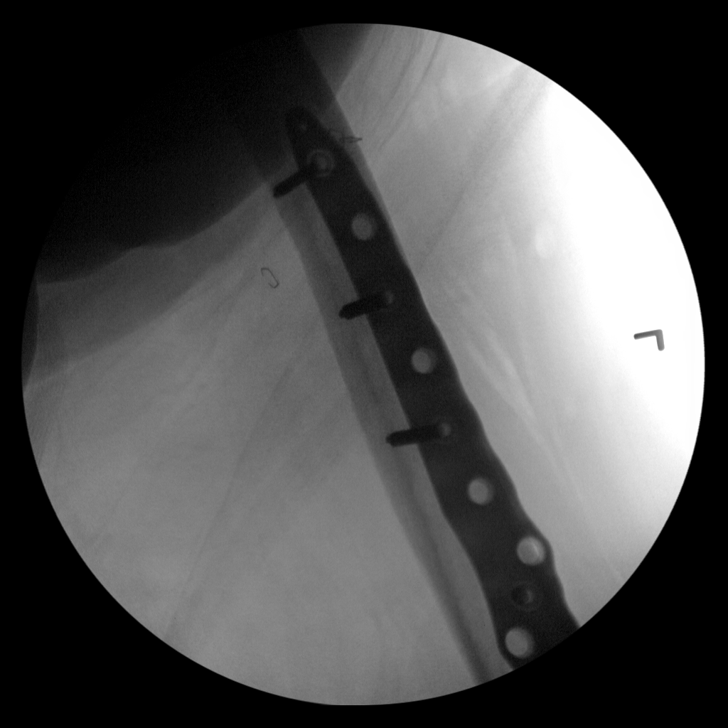

[8 of 8 positions shown; findings below may reference images not displayed]

FLUOROSCOPY TIME:  Fluoroscopy Time:  3 minutes 50 seconds

Radiation Exposure Index (if provided by the fluoroscopic device):
Not available

Number of Acquired Spot Images: 8
FINDINGS: The previously seen distal femoral fracture is again identified on
the initial images. Fixation screws are noted traversing the distal
most aspect of the femur. Subsequent fixation sideplate is noted.
Multiple fixation screws are seen. The fracture fragments are in
near anatomic alignment.
IMPRESSION: ORIF of distal femoral fracture

## 2018-12-18 ENCOUNTER — Other Ambulatory Visit: Payer: Self-pay | Admitting: Oncology

## 2018-12-18 DIAGNOSIS — D631 Anemia in chronic kidney disease: Secondary | ICD-10-CM

## 2018-12-20 ENCOUNTER — Inpatient Hospital Stay: Payer: Medicare Other

## 2018-12-20 ENCOUNTER — Other Ambulatory Visit: Payer: Self-pay

## 2018-12-20 ENCOUNTER — Inpatient Hospital Stay: Payer: Medicare Other | Attending: Oncology

## 2018-12-20 DIAGNOSIS — Z7901 Long term (current) use of anticoagulants: Secondary | ICD-10-CM | POA: Diagnosis not present

## 2018-12-20 DIAGNOSIS — I4891 Unspecified atrial fibrillation: Secondary | ICD-10-CM | POA: Insufficient documentation

## 2018-12-20 DIAGNOSIS — D631 Anemia in chronic kidney disease: Secondary | ICD-10-CM | POA: Diagnosis not present

## 2018-12-20 DIAGNOSIS — N183 Chronic kidney disease, stage 3 (moderate): Secondary | ICD-10-CM | POA: Diagnosis not present

## 2018-12-20 DIAGNOSIS — D509 Iron deficiency anemia, unspecified: Secondary | ICD-10-CM | POA: Diagnosis present

## 2018-12-20 LAB — COMPREHENSIVE METABOLIC PANEL
ALT: 12 U/L (ref 0–44)
AST: 23 U/L (ref 15–41)
Albumin: 3.5 g/dL (ref 3.5–5.0)
Alkaline Phosphatase: 83 U/L (ref 38–126)
Anion gap: 15 (ref 5–15)
BUN: 39 mg/dL — ABNORMAL HIGH (ref 8–23)
CO2: 33 mmol/L — ABNORMAL HIGH (ref 22–32)
Calcium: 9.4 mg/dL (ref 8.9–10.3)
Chloride: 91 mmol/L — ABNORMAL LOW (ref 98–111)
Creatinine, Ser: 1.83 mg/dL — ABNORMAL HIGH (ref 0.44–1.00)
GFR calc Af Amer: 33 mL/min — ABNORMAL LOW (ref >60)
GFR calc non Af Amer: 29 mL/min — ABNORMAL LOW (ref >60)
Glucose, Bld: 100 mg/dL — ABNORMAL HIGH (ref 70–99)
Potassium: 3.7 mmol/L (ref 3.5–5.1)
Sodium: 139 mmol/L (ref 135–145)
Total Bilirubin: 0.7 mg/dL (ref 0.3–1.2)
Total Protein: 8.4 g/dL — ABNORMAL HIGH (ref 6.5–8.1)

## 2018-12-20 LAB — CBC WITH DIFFERENTIAL/PLATELET
Abs Immature Granulocytes: 0.08 10*3/uL — ABNORMAL HIGH (ref 0.00–0.07)
Basophils Absolute: 0.1 10*3/uL (ref 0.0–0.1)
Basophils Relative: 0 %
Eosinophils Absolute: 0.2 10*3/uL (ref 0.0–0.5)
Eosinophils Relative: 2 %
HCT: 36.3 % (ref 36.0–46.0)
Hemoglobin: 10.7 g/dL — ABNORMAL LOW (ref 12.0–15.0)
Immature Granulocytes: 1 %
Lymphocytes Relative: 11 %
Lymphs Abs: 1.3 10*3/uL (ref 0.7–4.0)
MCH: 25.8 pg — ABNORMAL LOW (ref 26.0–34.0)
MCHC: 29.5 g/dL — ABNORMAL LOW (ref 30.0–36.0)
MCV: 87.7 fL (ref 80.0–100.0)
Monocytes Absolute: 1 10*3/uL (ref 0.1–1.0)
Monocytes Relative: 9 %
Neutro Abs: 9.1 10*3/uL — ABNORMAL HIGH (ref 1.7–7.7)
Neutrophils Relative %: 77 %
Platelets: 281 10*3/uL (ref 150–400)
RBC: 4.14 MIL/uL (ref 3.87–5.11)
RDW: 18.2 % — ABNORMAL HIGH (ref 11.5–15.5)
WBC: 11.7 10*3/uL — ABNORMAL HIGH (ref 4.0–10.5)
nRBC: 0 % (ref 0.0–0.2)

## 2018-12-20 LAB — RETIC PANEL
Immature Retic Fract: 29.3 % — ABNORMAL HIGH (ref 2.3–15.9)
RBC.: 4.14 MIL/uL (ref 3.87–5.11)
Retic Count, Absolute: 98.9 10*3/uL (ref 19.0–186.0)
Retic Ct Pct: 2.4 % (ref 0.4–3.1)
Reticulocyte Hemoglobin: 26.1 pg — ABNORMAL LOW (ref >27.9)

## 2018-12-20 LAB — IRON AND TIBC
Iron: 34 ug/dL (ref 28–170)
Saturation Ratios: 12 % (ref 10.4–31.8)
TIBC: 297 ug/dL (ref 250–450)
UIBC: 263 ug/dL

## 2018-12-20 LAB — FERRITIN: Ferritin: 76 ng/mL (ref 11–307)

## 2018-12-21 ENCOUNTER — Inpatient Hospital Stay: Payer: Medicare Other | Attending: Oncology | Admitting: Oncology

## 2018-12-21 ENCOUNTER — Other Ambulatory Visit: Payer: Self-pay

## 2018-12-21 ENCOUNTER — Encounter: Payer: Self-pay | Admitting: Oncology

## 2018-12-21 DIAGNOSIS — K2950 Unspecified chronic gastritis without bleeding: Secondary | ICD-10-CM

## 2018-12-21 DIAGNOSIS — Z79899 Other long term (current) drug therapy: Secondary | ICD-10-CM

## 2018-12-21 DIAGNOSIS — R5383 Other fatigue: Secondary | ICD-10-CM | POA: Diagnosis not present

## 2018-12-21 DIAGNOSIS — Z79891 Long term (current) use of opiate analgesic: Secondary | ICD-10-CM

## 2018-12-21 DIAGNOSIS — D509 Iron deficiency anemia, unspecified: Secondary | ICD-10-CM

## 2018-12-21 DIAGNOSIS — R9389 Abnormal findings on diagnostic imaging of other specified body structures: Secondary | ICD-10-CM

## 2018-12-21 DIAGNOSIS — Z87891 Personal history of nicotine dependence: Secondary | ICD-10-CM

## 2018-12-21 DIAGNOSIS — D631 Anemia in chronic kidney disease: Secondary | ICD-10-CM

## 2018-12-21 DIAGNOSIS — Z7901 Long term (current) use of anticoagulants: Secondary | ICD-10-CM | POA: Diagnosis not present

## 2018-12-21 DIAGNOSIS — N183 Chronic kidney disease, stage 3 (moderate): Secondary | ICD-10-CM

## 2018-12-21 DIAGNOSIS — Z794 Long term (current) use of insulin: Secondary | ICD-10-CM

## 2018-12-21 NOTE — Progress Notes (Signed)
HEMATOLOGY-ONCOLOGY TeleHEALTH VISIT PROGRESS NOTE  I connected with Shane Crutch on 12/21/18 at 10:00 AM EDT by video enabled telemedicine visit and verified that I am speaking with the correct person using two identifiers. I discussed the limitations, risks, security and privacy concerns of performing an evaluation and management service by telemedicine and the availability of in-person appointments. I also discussed with the patient that there may be a patient responsible charge related to this service. The patient expressed understanding and agreed to proceed.   Other persons participating in the visit and their role in the encounter:  Janeann Merl, RN, check in patient.   Patient's location: Home  Provider's location: Home office Chief Complaint: Follow-up for management of iron deficiency anemia and anemia secondary to CKD   INTERVAL HISTORY Andrea Rivers is a 63 y.o. female who has above history reviewed by me today presents for follow up visit for management of iron deficiency anemia and anemia secondary to CKD Problems and complaints are listed below:  Patient was last seen by me on 06/29/2018. Patient was admitted from 10/11/2020 10/16/2018 due to acute respiratory failure with hypoxia secondary to volume overload/CHF exacerbation.  Patient was treated with IV Lasix with symptom improvement and was switched to torsemide and metolazone upon discharge.  She follows up with heart failure clinic. Chronic atrial fibrillation, continued on amiodarone and Coumadin. Patient has chronic anemia secondary to iron deficiency as well as anemia secondary to CKD. She has previously received IV Venofer treatments good response.  Fatigue: reports worsening fatigue. Chronic onset, perisistent, no aggravating or improving factors, no associated symptoms.  Shortness of breath with exertion. Denies hematochezia, hematuria, hematemesis, epistaxis, black tarry stool or easy bruising.   Patient had  upper endoscopy done 10/12/2018 by Dr. Alice Reichert.  EGD showed normal esophagus, gastritis.  A single gastric polyp which was resected and retrieved.  Constipation, stable.  Takes laxatives and fiber.  Denies any blood in the stool.  Review of Systems  Constitutional: Positive for fatigue. Negative for appetite change, chills and fever.  HENT:   Negative for hearing loss and voice change.   Eyes: Negative for eye problems.  Respiratory: Positive for shortness of breath. Negative for chest tightness and cough.   Cardiovascular: Negative for chest pain.  Gastrointestinal: Negative for abdominal distention, abdominal pain and blood in stool.  Endocrine: Negative for hot flashes.  Genitourinary: Negative for difficulty urinating and frequency.   Musculoskeletal: Negative for arthralgias.  Skin: Negative for itching and rash.  Neurological: Negative for extremity weakness.  Hematological: Negative for adenopathy.  Psychiatric/Behavioral: Negative for confusion.    Past Medical History:  Diagnosis Date  . Anemia   . Anxiety   . CHF (congestive heart failure) ([place])   . Chronic kidney disease    INSUFFICIENCY  . Complication of anesthesia    had to be intubated during cataract surgery   " I could not breath "  . COPD (chronic obstructive pulmonary disease) ([place])   . Diabetes mellitus without complication ([place])   . Dyspnea    DOE  . Dysrhythmia    A FIB  . Edema   . Fracture of distal femur ([place]) 11/2017   left   . GERD (gastroesophageal reflux disease)   . History of kidney stones   . Hypertension   . Hypothyroidism    ABLATION  . Iron deficiency anemia 03/31/2017  . Neuropathy   . Orthopnea   . Sleep apnea    CPAP  . Vertigo   .  Wheezing    Past Surgical History:  Procedure Laterality Date  . CATARACT EXTRACTION W/PHACO Left 01/26/2017   Procedure: CATARACT EXTRACTION PHACO AND INTRAOCULAR LENS PLACEMENT (IOC);  Surgeon: Estill Cotta, MD;  Location: [facility] ORS;  Service:  Ophthalmology;  Laterality: Left;  [place]   1:02.2 AP     23.7 CDE   28.92 fluid pack lot# [id number] H exp.05/08/2018  . CHOLECYSTECTOMY    . ESOPHAGOGASTRODUODENOSCOPY N/A 10/12/2018   Procedure: ESOPHAGOGASTRODUODENOSCOPY (EGD);  Surgeon: Toledo, Benay Pike, MD;  Location: [facility];  Service: Gastroenterology;  Laterality: N/A;  . ORIF FEMUR FRACTURE Left 11/17/2017   Procedure: OPEN REDUCTION INTERNAL FIXATION (ORIF) DISTAL FEMUR FRACTURE;  Surgeon: Shona Needles, MD;  Location: [place];  Service: Orthopedics;  Laterality: Left;  . TONSILLECTOMY      Family History  Problem Relation Age of Onset  . COPD Mother   . Heart disease Mother   . Anemia Mother   . Heart disease Father   . COPD Father   . Anemia Sister   . Diabetes Maternal Grandmother   . Hypertension Paternal Grandfather     Social History   Socioeconomic History  . Marital status: Married    Spouse name: Not on file  . Number of children: Not on file  . Years of education: Not on file  . Highest education level: Not on file  Occupational History  . Not on file  Social Needs  . Financial resource strain: Not on file  . Food insecurity:    Worry: Not on file    Inability: Not on file  . Transportation needs:    Medical: Not on file    Non-medical: Not on file  Tobacco Use  . Smoking status: Former Smoker    Packs/day: 3.00    Types: Cigarettes    Last attempt to quit: 1988    Years since quitting: 32.3  . Smokeless tobacco: Never Used  Substance and Sexual Activity  . Alcohol use: No  . Drug use: No  . Sexual activity: Not on file  Lifestyle  . Physical activity:    Days per week: Not on file    Minutes per session: Not on file  . Stress: Not on file  Relationships  . Social connections:    Talks on phone: Not on file    Gets together: Not on file    Attends religious service: Not on file    Active member of club or organization: Not on file    Attends meetings of clubs or organizations: Not on  file    Relationship status: Not on file  . Intimate partner violence:    Fear of current or ex partner: Not on file    Emotionally abused: Not on file    Physically abused: Not on file    Forced sexual activity: Not on file  Other Topics Concern  . Not on file  Social History Narrative  . Not on file    Current Outpatient Medications on File Prior to Visit  Medication Sig Dispense Refill  . acetaminophen (TYLENOL) 500 MG tablet Take 1,000 mg by mouth 2 (two) times daily as needed for mild pain, fever or headache.     . albuterol (PROAIR HFA) 108 (90 Base) MCG/ACT inhaler Inhale 2 puffs into the lungs every 4 (four) hours as needed for wheezing or shortness of breath.     Marland Kitchen amiodarone (PACERONE) 200 MG tablet Take 200 mg by mouth daily.  0  . Biotin  10 MG CAPS Take 1 capsule by mouth daily.     . Calcium Carbonate-Vitamin D3 (CALCIUM 600-D) 600-400 MG-UNIT TABS Take 1 tablet by mouth 3 (three) times daily with meals.     . cetirizine (ZYRTEC) 10 MG tablet Take 10 mg by mouth daily.    . Cholecalciferol (D3 HIGH POTENCY) 125 MCG (5000 UT) capsule Take 5,000 Units by mouth daily.     . citalopram (CELEXA) 40 MG tablet Take 40 mg by mouth daily.     . clonazePAM (KLONOPIN) 1 MG tablet Take 1 tablet by mouth 2 (two) times daily as needed for anxiety.     . cloNIDine (CATAPRES) 0.1 MG tablet Take 1 tablet (0.1 mg total) by mouth 2 (two) times daily. 60 tablet 5  . Co-Enzyme Q-10 100 MG CAPS Take 100 mg by mouth daily.     . cyclobenzaprine (FLEXERIL) 5 MG tablet Take 5 mg by mouth 3 (three) times daily as needed for muscle spasms.    Marland Kitchen docusate sodium (COLACE) 50 MG capsule Take 100-200 mg by mouth 2 (two) times daily. 200mg  in the morning and 100mg  in the evening    . fluticasone (FLONASE) 50 MCG/ACT nasal spray SHAKE LQ AND U 1 SPR IEN QD    . Fluticasone-Salmeterol (WIXELA INHUB) 250-50 MCG/DOSE AEPB Inhale 1 puff into the lungs 2 (two) times daily.    Marland Kitchen gabapentin (NEURONTIN) 100 MG  capsule Take 100 mg by mouth 3 (three) times daily.    . Garlic 7829 MG CAPS Take 1,000 mg by mouth daily.     Marland Kitchen glimepiride (AMARYL) 2 MG tablet Take 2 mg by mouth daily with breakfast.    . HYDROcodone-acetaminophen (NORCO) 10-325 MG tablet Take 1 tablet by mouth every 6 (six) hours as needed for severe pain.     . Insulin Detemir (LEVEMIR FLEXTOUCH) 100 UNIT/ML Pen Inject 22 Units into the skin at bedtime.     . insulin lispro (HUMALOG) 100 UNIT/ML injection Inject 15-20 Units into the skin 3 (three) times daily before meals.     Marland Kitchen levothyroxine (SYNTHROID, LEVOTHROID) 175 MCG tablet Take 175 mcg by mouth every evening.     . linagliptin (TRADJENTA) 5 MG TABS tablet take 1 tablet by mouth once daily    . lovastatin (MEVACOR) 20 MG tablet Take 20 mg by mouth at bedtime.    . magnesium oxide (MAG-OX) 400 MG tablet Take 400 mg by mouth 2 (two) times daily.    . metolazone (ZAROXOLYN) 5 MG tablet Take 5 mg by mouth as needed (for a weight gain of 2 pounds overnight or 5 pounds in a week; MAX OF 5 TABLETS AT A TIME).     . Multiple Vitamins-Minerals (CENTRUM WOMEN) TABS Take 1 tablet by mouth daily.     . Omega-3 Fatty Acids (FISH OIL) 1000 MG CPDR Take 1 capsule by mouth daily.    . pantoprazole (PROTONIX) 40 MG tablet Take 1 tablet (40 mg total) by mouth 2 (two) times daily before a meal. 60 tablet 1  . potassium chloride SA (K-DUR,KLOR-CON) 20 MEQ tablet Take 2 tablets (40 mEq total) by mouth daily. (Patient taking differently: Take 20 mEq by mouth 2 (two) times daily. Take 20 mEq twice daily and double dose when taking Metolazole) 180 tablet 3  . senna (SENOKOT) 8.6 MG TABS tablet Take 8.6-17.2 mg by mouth 2 (two) times daily. 17.2mg  (2 tablets) in the morning and 8.6mg  in the evening    . torsemide (DEMADEX)  20 MG tablet TAKE 2 TABLETS BY MOUTH TWICE A DAY (Patient taking differently: Take 40 mg by mouth 2 (two) times daily. ) 360 tablet 3  . vitamin C (ASCORBIC ACID) 500 MG tablet Take 500 mg by  mouth daily.    Marland Kitchen warfarin (COUMADIN) 5 MG tablet Take 5 mg by mouth daily at 6 PM.   0   No current facility-administered medications on file prior to visit.     Allergies  Allergen Reactions  . Other Other (See Comments)    ILOSONE- Caused GI distress also  . Latex Hives  . Exenatide Nausea Only and Other (See Comments)    [place]- Nausea and abdominal pain, also  . Garlic Other (See Comments)    Severe acid reflux  . Onion Other (See Comments)    Severe acid reflux  . Rosiglitazone Other (See Comments)    [place]and[place]- Affected heart  . Erythromycin Nausea And Vomiting    GI DISTRESS       Observations/Objective: There were no vitals filed for this visit. There is no height or weight on file to calculate BMI.  Pain level 0 Physical Exam  Constitutional: She is oriented to person, place, and time. No distress.  HENT:  Head: Normocephalic and atraumatic.  Pulmonary/Chest: Effort normal.  Neurological: She is alert and oriented to person, place, and time.  Psychiatric: Affect normal.   I have personally reviewed below laboratory results. CBC    Component Value Date/Time   WBC 11.7 (H) 12/20/2018 1100   RBC 4.14 12/20/2018 1100   RBC 4.14 12/20/2018 1100   HGB 10.7 (L) 12/20/2018 1100   HCT 36.3 12/20/2018 1100   PLT 281 12/20/2018 1100   MCV 87.7 12/20/2018 1100   MCH 25.8 (L) 12/20/2018 1100   MCHC 29.5 (L) 12/20/2018 1100   RDW 18.2 (H) 12/20/2018 1100   LYMPHSABS 1.3 12/20/2018 1100   MONOABS 1.0 12/20/2018 1100   EOSABS 0.2 12/20/2018 1100   BASOSABS 0.1 12/20/2018 1100    CMP     Component Value Date/Time   NA 139 12/20/2018 1100   K 3.7 12/20/2018 1100   CL 91 (L) 12/20/2018 1100   CO2 33 (H) 12/20/2018 1100   GLUCOSE 100 (H) 12/20/2018 1100   BUN 39 (H) 12/20/2018 1100   CREATININE 1.83 (H) 12/20/2018 1100   CALCIUM 9.4 12/20/2018 1100   PROT 8.4 (H) 12/20/2018 1100   ALBUMIN 3.5 12/20/2018 1100   AST 23 12/20/2018 1100   ALT 12 12/20/2018 1100    ALKPHOS 83 12/20/2018 1100   BILITOT 0.7 12/20/2018 1100   GFRNONAA 29 (L) 12/20/2018 1100   GFRAA 33 (L) 12/20/2018 1100    RADIOGRAPHIC STUDIES: I have personally reviewed the radiological images as listed and agreed with the findings in the report. 11/06/2018 DG abdominal 2 views Nonobstructive bowel gas pattern with mild fecal retention throughout the colon. 10/12/2018 chest 1 view Cardiomegaly with mild diffuse pulmonary interstitial congestion without frank pulmonary edema.  No other cardiopulmonary abnormality.  No acute osseous finding.  05/09/2018 CT abdomen and pelvis with contrast No definite bowel obstruction identified.  Diffuse liquid stools with: With more formed feces in the rectal sigmoid colon.  Small moderate retained feces at the rectum.  Asymmetric 4.7 x 3.7's soft tissue density at the low left rectum/anus possible mass lesion.  Recommend correlation with direct inspection. No acute intra-abdominal or pelvic abnormalities.  Assessment and Plan: 1. Anemia of chronic kidney failure, stage 3 (moderate) (HCC)  2. Iron deficiency anemia, unspecified iron deficiency anemia type   3. Chronic anticoagulation   4. Other fatigue   5. Abnormal CT scan   6. Chronic gastritis without bleeding, unspecified gastritis type     #Iron deficiency anemia Labs are reviewed and discussed with patient Patient's CBC showed hemoglobin of 10.7, decreased her baseline between 11-12.  decreased reticulocyte hemoglobin, indicating underlying iron deficiency. Iron panel showed low iron saturation ratio 12, ferritin 76.  TIBC 297, this is consistent with anemia secondary to chronic inflammation/kidney disease.  Discussed with patient that I would recommend proceed with IV Venofer every 2 weeks for 3 doses to further improve her iron panel. Currently her hemoglobin is above 10, she would not need erythropoietin treatments.  Anticipate hemoglobin can further improve to her baseline after iron is  replete.  She voices understanding and agrees with the plan.  Fatigue is likely secondary to underlying iron deficiency and worsening of anemia.  Anticipate to improve after iron is repleted.  Chronic anticoagulation, for A. fib.  On Coumadin and amiodarone. Need to close monitor her hemoglobin.  Suspect underlying GI blood loss.  Last EGD was done in March 2020 which showed gastritis.  Patient declines colonoscopy as she cannot tolerate the prep.  Gastritis, continue protonix   Abnormal CT  I independently reviewed the patient's CT that was done on 05/09/2018 which showed a possible soft tissue density 4.7 x 3.7 in the lower left rectum/anus, questionable mass. At that time patient has had severe constipation.  She was not aware about her CT results. I discussed with her about the CT results and I recommend patient to continue follow-up with Dr. Alice Reichert for direct visualization of rectum area.  Patient previously has declined colonoscopy in the past.  She is now open for additional work-up but hope to have a gentle prep.  I have emailed Dr. Alice Reichert to further discuss the case with him.                                                                                                                        Follow Up Instructions: Venofer every 2 weeks x 3 Lab MD Venofer in 3 months labs done prior to visit.   I discussed the assessment and treatment plan with the patient. The patient was provided an opportunity to ask questions and all were answered. The patient agreed with the plan and demonstrated an understanding of the instructions.  The patient was advised to call back or seek an in-person evaluation if the symptoms worsen or if the condition fails to improve as anticipated.   I provided 25 minutes of face-to-face video visit time during this encounter, and > 50% was spent counseling as documented under my assessment & plan.  Earlie Server, MD 12/21/2018 6:40 PM

## 2018-12-21 NOTE — Progress Notes (Signed)
Patient contacted for telehealth visit. States she gets tired easily and feels tired most of the time.

## 2018-12-28 ENCOUNTER — Inpatient Hospital Stay: Payer: Medicare Other

## 2018-12-28 ENCOUNTER — Ambulatory Visit: Payer: Medicare Other

## 2019-01-04 ENCOUNTER — Ambulatory Visit: Payer: Medicare Other

## 2019-01-10 ENCOUNTER — Telehealth: Payer: Self-pay

## 2019-01-10 ENCOUNTER — Ambulatory Visit: Payer: Medicare Other | Attending: Family | Admitting: Family

## 2019-01-10 ENCOUNTER — Other Ambulatory Visit: Payer: Self-pay

## 2019-01-10 ENCOUNTER — Encounter: Payer: Self-pay | Admitting: Family

## 2019-01-10 VITALS — Wt 308.4 lb

## 2019-01-10 DIAGNOSIS — I5032 Chronic diastolic (congestive) heart failure: Secondary | ICD-10-CM

## 2019-01-10 DIAGNOSIS — E1122 Type 2 diabetes mellitus with diabetic chronic kidney disease: Secondary | ICD-10-CM

## 2019-01-10 DIAGNOSIS — I10 Essential (primary) hypertension: Secondary | ICD-10-CM

## 2019-01-10 DIAGNOSIS — I890 Lymphedema, not elsewhere classified: Secondary | ICD-10-CM

## 2019-01-10 DIAGNOSIS — Z794 Long term (current) use of insulin: Secondary | ICD-10-CM

## 2019-01-10 NOTE — Progress Notes (Signed)
Evaluation Performed:  Follow-up visit  This visit type was conducted due to national recommendations for restrictions regarding the COVID-19 Pandemic (e.g. social distancing).  This format is felt to be most appropriate for this patient at this time.  All issues noted in this document were discussed and addressed.  No physical exam was performed (except for noted visual exam findings with Video Visits).  Please refer to the patient's chart (MyChart message for video visits and phone note for telephone visits) for the patient's consent to telehealth for [facility]  Date:  01/10/2019   ID:  Andrea Rivers, DOB 01-Sep-1955, MRN [id number]  Patient Location:  [place] [address]   Provider location:   [facility] [address], [place] [address]  PCP:  Sofie Hartigan, MD  Cardiologist:  Bartholome Bill, MD Electrophysiologist:  None   Chief Complaint:  Shortness of breath  History of Present Illness:    Andrea Rivers is a 63 y.o. female who presents via audio/video conferencing for a telehealth visit today.  Patient verified DOB and address.  The patient does not have symptoms concerning for COVID-19 infection (fever, chills, cough, or new SHORTNESS OF BREATH).   Patient reports minimal shortness of breath upon moderate exertion. She describes this as chronic in nature having been present for several years. She has associated dizziness, palpitation and fatigue along with this. She denies any swelling in legs/ abdomen, chest pain, difficulty sleeping or weight gain.   Prior CV studies:   The following studies were reviewed today:  Echo report from 10/13/2018 reviewed and showed an EF of 55-60%  Past Medical History:  Diagnosis Date  . Anemia   . Anxiety   . CHF (congestive heart failure) ([place])   . Chronic kidney disease    INSUFFICIENCY  . Complication of anesthesia    had to be intubated during cataract surgery    " I could not breath "  . COPD (chronic obstructive pulmonary disease) ([place])   . Diabetes mellitus without complication ([place])   . Dyspnea    DOE  . Dysrhythmia    A FIB  . Edema   . Fracture of distal femur ([place]) 11/2017   left   . GERD (gastroesophageal reflux disease)   . History of kidney stones   . Hypertension   . Hypothyroidism    ABLATION  . Iron deficiency anemia 03/31/2017  . Neuropathy   . Orthopnea   . Sleep apnea    CPAP  . Vertigo   . Wheezing    Past Surgical History:  Procedure Laterality Date  . CATARACT EXTRACTION W/PHACO Left 01/26/2017   Procedure: CATARACT EXTRACTION PHACO AND INTRAOCULAR LENS PLACEMENT (IOC);  Surgeon: Estill Cotta, MD;  Location: [facility] ORS;  Service: Ophthalmology;  Laterality: Left;  [place]   1:02.2 AP     23.7 CDE   28.92 fluid pack lot# [id number] H exp.05/08/2018  . CHOLECYSTECTOMY    . ESOPHAGOGASTRODUODENOSCOPY N/A 10/12/2018   Procedure: ESOPHAGOGASTRODUODENOSCOPY (EGD);  Surgeon: Toledo, Benay Pike, MD;  Location: [facility] ENDOSCOPY;  Service: Gastroenterology;  Laterality: N/A;  . ORIF FEMUR FRACTURE Left 11/17/2017   Procedure: OPEN REDUCTION INTERNAL FIXATION (ORIF) DISTAL FEMUR FRACTURE;  Surgeon: Shona Needles, MD;  Location: [facility];  Service: Orthopedics;  Laterality: Left;  . TONSILLECTOMY       Current Meds  Medication Sig  . acetaminophen (TYLENOL) 500 MG tablet Take 1,000 mg by mouth 2 (  two) times daily as needed for mild pain, fever or headache.   . albuterol (PROAIR HFA) 108 (90 Base) MCG/ACT inhaler Inhale 2 puffs into the lungs every 4 (four) hours as needed for wheezing or shortness of breath.   Marland Kitchen amiodarone (PACERONE) 200 MG tablet Take 200 mg by mouth daily.  . Biotin 10 MG CAPS Take 1 capsule by mouth daily.   . Calcium Carbonate-Vitamin D3 (CALCIUM 600-D) 600-400 MG-UNIT TABS Take 1 tablet by mouth 3 (three) times daily with meals.   . cetirizine (ZYRTEC) 10 MG tablet Take 10 mg by mouth daily.  . Cholecalciferol  (D3 HIGH POTENCY) 125 MCG (5000 UT) capsule Take 5,000 Units by mouth daily.   . citalopram (CELEXA) 40 MG tablet Take 40 mg by mouth daily.   . clonazePAM (KLONOPIN) 1 MG tablet Take 1 tablet by mouth 2 (two) times daily as needed for anxiety.   . cloNIDine (CATAPRES) 0.1 MG tablet Take 1 tablet (0.1 mg total) by mouth 2 (two) times daily.  Marland Kitchen Co-Enzyme Q-10 100 MG CAPS Take 100 mg by mouth daily.   . cyclobenzaprine (FLEXERIL) 5 MG tablet Take 5 mg by mouth 3 (three) times daily as needed for muscle spasms.  Marland Kitchen docusate sodium (COLACE) 50 MG capsule Take 100-200 mg by mouth 2 (two) times daily. 200mg  in the morning and 100mg  in the evening  . fluticasone (FLONASE) 50 MCG/ACT nasal spray SHAKE LQ AND U 1 SPR IEN QD  . Fluticasone-Salmeterol (WIXELA INHUB) 250-50 MCG/DOSE AEPB Inhale 1 puff into the lungs 2 (two) times daily.  Marland Kitchen gabapentin (NEURONTIN) 100 MG capsule Take 100 mg by mouth 3 (three) times daily.  . Garlic 8250 MG CAPS Take 1,000 mg by mouth daily.   Marland Kitchen glimepiride (AMARYL) 2 MG tablet Take 2 mg by mouth daily with breakfast.  . HYDROcodone-acetaminophen (NORCO) 10-325 MG tablet Take 1 tablet by mouth every 6 (six) hours as needed for severe pain.   . Insulin Detemir (LEVEMIR FLEXTOUCH) 100 UNIT/ML Pen Inject 22 Units into the skin at bedtime.   . insulin lispro (HUMALOG) 100 UNIT/ML injection Inject 15-20 Units into the skin 3 (three) times daily before meals.   Marland Kitchen levothyroxine (SYNTHROID, LEVOTHROID) 175 MCG tablet Take 175 mcg by mouth every evening.   . linagliptin (TRADJENTA) 5 MG TABS tablet take 1 tablet by mouth once daily  . lovastatin (MEVACOR) 20 MG tablet Take 20 mg by mouth at bedtime.  . magnesium oxide (MAG-OX) 400 MG tablet Take 400 mg by mouth 2 (two) times daily.  . metolazone (ZAROXOLYN) 5 MG tablet Take 5 mg by mouth as needed (for a weight gain of 2 pounds overnight or 5 pounds in a week; MAX OF 5 TABLETS AT A TIME).   . Multiple Vitamins-Minerals (CENTRUM WOMEN)  TABS Take 1 tablet by mouth daily.   . Omega-3 Fatty Acids (FISH OIL) 1000 MG CPDR Take 1 capsule by mouth daily.  . pantoprazole (PROTONIX) 40 MG tablet Take 1 tablet (40 mg total) by mouth 2 (two) times daily before a meal.  . potassium chloride SA (K-DUR,KLOR-CON) 20 MEQ tablet Take 2 tablets (40 mEq total) by mouth daily. (Patient taking differently: Take 20 mEq by mouth 2 (two) times daily. Take 20 mEq twice daily and double dose when taking Metolazole)  . senna (SENOKOT) 8.6 MG TABS tablet Take 8.6-17.2 mg by mouth 2 (two) times daily. 17.2mg  (2 tablets) in the morning and 8.6mg  in the evening  . torsemide (DEMADEX) 20  MG tablet TAKE 2 TABLETS BY MOUTH TWICE A DAY (Patient taking differently: Take 40 mg by mouth 2 (two) times daily. )  . vitamin C (ASCORBIC ACID) 500 MG tablet Take 500 mg by mouth daily.  Marland Kitchen warfarin (COUMADIN) 5 MG tablet Take 5 mg by mouth daily at 6 PM.      Allergies:   Other; Latex; Exenatide; Garlic; Onion; Rosiglitazone; and Erythromycin   Social History   Tobacco Use  . Smoking status: Former Smoker    Packs/day: 3.00    Types: Cigarettes    Last attempt to quit: 1988    Years since quitting: 32.4  . Smokeless tobacco: Never Used  Substance Use Topics  . Alcohol use: No  . Drug use: No     Family Hx: The patient's family history includes Anemia in her mother and sister; COPD in her father and mother; Diabetes in her maternal grandmother; Heart disease in her father and mother; Hypertension in her paternal grandfather.  ROS:   Please see the history of present illness.     All other systems reviewed and are negative.   Labs/Other Tests and Data Reviewed:    Recent Labs: 08/09/2018: TSH 2.298 10/11/2018: Magnesium 2.6 10/12/2018: B Natriuretic Peptide 379.0 12/20/2018: ALT 12; BUN 39; Creatinine, Ser 1.83; Hemoglobin 10.7; Platelets 281; Potassium 3.7; Sodium 139   Recent Lipid Panel Lab Results  Component Value Date/Time   TRIG 68 02/22/2017 05:06  PM    Wt Readings from Last 3 Encounters:  01/10/19 (!) 308 lb 6 oz (139.9 kg)  10/19/18 (!) 324 lb 4 oz (147.1 kg)  10/16/18 (!) 321 lb 6.2 oz (145.8 kg)     Exam:    Vital Signs:  Wt (!) 308 lb 6 oz (139.9 kg) Comment: self-reported  BMI 51.32 kg/m    Well nourished, well developed female in no  acute distress.   ASSESSMENT & PLAN:    1. Chronic heart failure with preserved ejection fraction- - NYHA class II - euvolemic today based on patient's description of symptoms - weighing daily; reminded to call for an overnight weight gain of >2 pounds or a weekly weight gain of >5 pounds - weight down from last visit here 3 months ago - says that she took 2 days of metolazone ~ 10 days ago which worked well - not adding salt to her food  - had telemedicine visit with cardiologist Ubaldo Glassing) 12/25/2018 - supposed to be wearing oxygen at 2L and is waiting on cardiology to assist her in getting portable oxygen tanks  - BNP 10/12/2018 was 379.0  2: HTN- - not checking her BP at home - BMP 01/04/2019 reviewed and showed sodium 143, potassium 4.1, creatinine 1.8 and GFR 28 - saw PCP Wadie Lessen) 11/16/2018  3: Diabetes- - A1c from 12/25/2018 was 6.5% - saw endocrinologist Honor Junes) 12/25/2018 - sees nephrology Holley Raring)   4: Lymphedema- - stage 2 - elevating them at times with some improvement; instructed to keep them elevated when sitting for long periods of time - unable to exercise much due to knee pain but does try to walk with her walker - unable to wear compression socks due to inability to get them on - patient says that she's waiting until "this COVID thing resolves" before contacting [employer] to get the compression boots; encouraged her to go ahead and call so they can ship them   COVID-19 Education: The signs and symptoms of COVID-19 were discussed with the patient and how to seek care for  testing (follow up with PCP or arrange E-visit).  The importance of social distancing  was discussed today.  Patient Risk:   After full review of this patients clinical status, I feel that they are at least moderate risk at this time.  Time:   Today, I have spent 13 minutes with the patient with telehealth technology discussing diet, weight and symptoms to report.      Medication Adjustments/Labs and Tests Ordered: Current medicines are reviewed at length with the patient today.  Concerns regarding medicines are outlined above.   Tests Ordered: No orders of the defined types were placed in this encounter.  Medication Changes: No orders of the defined types were placed in this encounter. Patient did not have medications or a list available for review.   Disposition:  Follow-up in 4 months or sooner for any questions/problems before then.   Signed, Alisa Graff, FNP  01/10/2019 10:21 AM    [facility]

## 2019-01-10 NOTE — Telephone Encounter (Signed)
TELEPHONE CALL NOTE  Andrea Rivers has been deemed a candidate for a follow-up tele-health visit to limit community exposure during the Covid-19 pandemic. I spoke with the patient via phone to ensure availability of phone/video source, confirm preferred email & phone number, discuss instructions and expectations, and review consent.   I reminded Andrea Rivers to be prepared with any vital sign and/or heart rhythm information that could potentially be obtained via home monitoring, at the time of her visit.  Finally, I reminded Andrea Rivers to expect an e-mail containing a link for their video-based visit approximately 15 minutes before her visit, or alternatively, a phone call at the time of her visit if her visit is planned to be a phone encounter.  Did the patient verbally consent to treatment as below? YES  Andrea Rivers, CMA 01/10/2019 8:54 AM  CONSENT FOR TELE-HEALTH VISIT - PLEASE REVIEW  I hereby voluntarily request, consent and authorize [facility] and its employed or contracted physicians, physician assistants, nurse practitioners or other licensed health care professionals (the Practitioner), to provide me with telemedicine health care services (the "Services") as deemed necessary by the treating Practitioner. I acknowledge and consent to receive the Services by the Practitioner via telemedicine. I understand that the telemedicine visit will involve communicating with the Practitioner through telephonic communication technology and the disclosure of certain medical information by electronic transmission. I acknowledge that I have been given the opportunity to request an in-person assessment or other available alternative prior to the telemedicine visit and am voluntarily participating in the telemedicine visit.  I understand that I have the right to withhold or withdraw my consent to the use of telemedicine in the course of my care at any time, without affecting my  right to future care or treatment, and that the Practitioner or I may terminate the telemedicine visit at any time. I understand that I have the right to inspect all information obtained and/or recorded in the course of the telemedicine visit and may receive copies of available information for a reasonable fee.  I understand that some of the potential risks of receiving the Services via telemedicine include:  Marland Kitchen Delay or interruption in medical evaluation due to technological equipment failure or disruption; . Information transmitted may not be sufficient (e.g. poor resolution of images) to allow for appropriate medical decision making by the Practitioner; and/or  . In rare instances, security protocols could fail, causing a breach of personal health information.  Furthermore, I acknowledge that it is my responsibility to provide information about my medical history, conditions and care that is complete and accurate to the best of my ability. I acknowledge that Practitioner's advice, recommendations, and/or decision may be based on factors not within their control, such as incomplete or inaccurate data provided by me or lack of visual representation. I understand that the practice of medicine is not an exact science and that Practitioner makes no warranties or guarantees regarding treatment outcomes. I acknowledge that I will receive a copy of this consent concurrently upon execution via email to the email address I last provided but may also request a printed copy by calling the office of [facility].    I understand that my insurance may be billed for this visit.   I have read or had this consent read to me. . I understand the contents of this consent, which adequately explains the benefits and risks of the Services being provided via telemedicine.  Marland Kitchen  I have been provided ample opportunity to ask questions regarding this consent and the Services and have had my questions answered to my  satisfaction. . I give my informed consent for the services to be provided through the use of telemedicine in my medical care  By participating in this telemedicine visit I agree to the above.

## 2019-01-10 NOTE — Patient Instructions (Signed)
Continue weighing daily and call for an overnight weight gain of > 2 pounds or a weekly weight gain of >5 pounds. 

## 2019-01-10 NOTE — Telephone Encounter (Signed)
   TELEPHONE CALL NOTE  This patient has been deemed a candidate for follow-up tele-health visit to limit community exposure during the Covid-19 pandemic. I spoke with the patient via phone to discuss instructions. The patient was advised to review the section on consent for treatment as well. The patient will receive a phone call 2-3 days prior to their E-Visit at which time consent will be verbally confirmed. A Virtual Office Visit appointment type has been scheduled for 01/10/2019 with Darylene Price FNP.  [name], CMA 01/10/2019 8:53 AM

## 2019-01-11 ENCOUNTER — Ambulatory Visit: Payer: Medicare Other

## 2019-01-11 ENCOUNTER — Inpatient Hospital Stay: Payer: Medicare Other | Attending: Oncology

## 2019-01-24 ENCOUNTER — Other Ambulatory Visit: Payer: Self-pay

## 2019-01-25 ENCOUNTER — Inpatient Hospital Stay: Payer: Medicare Other | Attending: Oncology

## 2019-01-25 ENCOUNTER — Other Ambulatory Visit: Payer: Self-pay

## 2019-01-25 VITALS — BP 123/72 | HR 53 | Resp 19

## 2019-01-25 DIAGNOSIS — D509 Iron deficiency anemia, unspecified: Secondary | ICD-10-CM | POA: Insufficient documentation

## 2019-01-25 MED ORDER — IRON SUCROSE 20 MG/ML IV SOLN
200.0000 mg | Freq: Once | INTRAVENOUS | Status: AC
  Administered 2019-01-25: 14:00:00 200 mg via INTRAVENOUS
  Filled 2019-01-25: qty 10

## 2019-01-25 MED ORDER — SODIUM CHLORIDE 0.9 % IV SOLN
Freq: Once | INTRAVENOUS | Status: AC
  Administered 2019-01-25: 14:00:00 via INTRAVENOUS
  Filled 2019-01-25: qty 250

## 2019-02-06 ENCOUNTER — Telehealth: Payer: Self-pay | Admitting: Family

## 2019-02-06 ENCOUNTER — Other Ambulatory Visit: Payer: Self-pay | Admitting: Family

## 2019-02-06 MED ORDER — CLONIDINE HCL 0.1 MG PO TABS: 0.1000 mg | ORAL_TABLET | Freq: Every day | ORAL | 5 refills | Status: AC

## 2019-02-06 MED ORDER — LOSARTAN POTASSIUM 25 MG PO TABS: 25.0000 mg | ORAL_TABLET | Freq: Every day | ORAL | 3 refills | Status: AC

## 2019-02-06 NOTE — Telephone Encounter (Signed)
Patient says that her nephrologist Holley Raring) started her on losartan 25mg  and since that's been added, her BP has become low (103/52 & 106/57) and she's felt dizzy. She says that she's taking the clonidine at different times of the day and not at the same time as her losartan.   Advised patient to decrease her clonidine to once daily (was twice daily) and continue to monitor her BP. Discussed possibly getting her off clonidine completely and then titrating up losartan if able.   Patient verbalized understanding and was agreeable to this plan.

## 2019-02-08 ENCOUNTER — Inpatient Hospital Stay: Payer: Medicare Other | Attending: Oncology

## 2019-02-08 ENCOUNTER — Other Ambulatory Visit: Payer: Self-pay

## 2019-02-08 VITALS — BP 110/70

## 2019-02-08 DIAGNOSIS — D631 Anemia in chronic kidney disease: Secondary | ICD-10-CM | POA: Insufficient documentation

## 2019-02-08 DIAGNOSIS — N189 Chronic kidney disease, unspecified: Secondary | ICD-10-CM | POA: Insufficient documentation

## 2019-02-08 DIAGNOSIS — D509 Iron deficiency anemia, unspecified: Secondary | ICD-10-CM

## 2019-02-08 MED ORDER — IRON SUCROSE 20 MG/ML IV SOLN
200.0000 mg | Freq: Once | INTRAVENOUS | Status: AC
  Administered 2019-02-08: 200 mg via INTRAVENOUS
  Filled 2019-02-08: qty 10

## 2019-02-08 MED ORDER — SODIUM CHLORIDE 0.9 % IV SOLN
Freq: Once | INTRAVENOUS | Status: AC
  Administered 2019-02-08: 14:00:00 via INTRAVENOUS
  Filled 2019-02-08: qty 250

## 2019-03-22 ENCOUNTER — Other Ambulatory Visit: Payer: Self-pay | Admitting: Family

## 2019-03-23 ENCOUNTER — Other Ambulatory Visit: Payer: Self-pay

## 2019-03-23 ENCOUNTER — Inpatient Hospital Stay: Payer: Medicare Other | Attending: Oncology

## 2019-03-23 ENCOUNTER — Inpatient Hospital Stay: Payer: Medicare Other

## 2019-03-23 DIAGNOSIS — Z7901 Long term (current) use of anticoagulants: Secondary | ICD-10-CM | POA: Insufficient documentation

## 2019-03-23 DIAGNOSIS — N183 Chronic kidney disease, stage 3 (moderate): Secondary | ICD-10-CM | POA: Diagnosis not present

## 2019-03-23 DIAGNOSIS — D631 Anemia in chronic kidney disease: Secondary | ICD-10-CM

## 2019-03-23 DIAGNOSIS — R001 Bradycardia, unspecified: Secondary | ICD-10-CM | POA: Insufficient documentation

## 2019-03-23 LAB — CBC WITH DIFFERENTIAL/PLATELET
Abs Immature Granulocytes: 0.06 10*3/uL (ref 0.00–0.07)
Basophils Absolute: 0 10*3/uL (ref 0.0–0.1)
Basophils Relative: 0 %
Eosinophils Absolute: 0.1 10*3/uL (ref 0.0–0.5)
Eosinophils Relative: 1 %
HCT: 39.2 % (ref 36.0–46.0)
Hemoglobin: 12 g/dL (ref 12.0–15.0)
Immature Granulocytes: 1 %
Lymphocytes Relative: 10 %
Lymphs Abs: 1.1 10*3/uL (ref 0.7–4.0)
MCH: 25.6 pg — ABNORMAL LOW (ref 26.0–34.0)
MCHC: 30.6 g/dL (ref 30.0–36.0)
MCV: 83.8 fL (ref 80.0–100.0)
Monocytes Absolute: 1 10*3/uL (ref 0.1–1.0)
Monocytes Relative: 9 %
Neutro Abs: 8.5 10*3/uL — ABNORMAL HIGH (ref 1.7–7.7)
Neutrophils Relative %: 79 %
Platelets: 214 10*3/uL (ref 150–400)
RBC: 4.68 MIL/uL (ref 3.87–5.11)
RDW: 19.6 % — ABNORMAL HIGH (ref 11.5–15.5)
WBC: 10.8 10*3/uL — ABNORMAL HIGH (ref 4.0–10.5)
nRBC: 0 % (ref 0.0–0.2)

## 2019-03-23 LAB — COMPREHENSIVE METABOLIC PANEL
ALT: 15 U/L (ref 0–44)
AST: 22 U/L (ref 15–41)
Albumin: 3.9 g/dL (ref 3.5–5.0)
Alkaline Phosphatase: 76 U/L (ref 38–126)
Anion gap: 14 (ref 5–15)
BUN: 80 mg/dL — ABNORMAL HIGH (ref 8–23)
CO2: 34 mmol/L — ABNORMAL HIGH (ref 22–32)
Calcium: 9.8 mg/dL (ref 8.9–10.3)
Chloride: 91 mmol/L — ABNORMAL LOW (ref 98–111)
Creatinine, Ser: 2.92 mg/dL — ABNORMAL HIGH (ref 0.44–1.00)
GFR calc Af Amer: 19 mL/min — ABNORMAL LOW (ref >60)
GFR calc non Af Amer: 16 mL/min — ABNORMAL LOW (ref >60)
Glucose, Bld: 78 mg/dL (ref 70–99)
Potassium: 3.1 mmol/L — ABNORMAL LOW (ref 3.5–5.1)
Sodium: 139 mmol/L (ref 135–145)
Total Bilirubin: 0.5 mg/dL (ref 0.3–1.2)
Total Protein: 8.4 g/dL — ABNORMAL HIGH (ref 6.5–8.1)

## 2019-03-23 LAB — FERRITIN: Ferritin: 78 ng/mL (ref 11–307)

## 2019-03-23 LAB — IRON AND TIBC
Iron: 33 ug/dL (ref 28–170)
Saturation Ratios: 11 % (ref 10.4–31.8)
TIBC: 298 ug/dL (ref 250–450)
UIBC: 265 ug/dL

## 2019-03-26 ENCOUNTER — Inpatient Hospital Stay: Payer: Medicare Other | Admitting: Oncology

## 2019-03-26 ENCOUNTER — Inpatient Hospital Stay: Payer: Medicare Other

## 2019-04-02 ENCOUNTER — Other Ambulatory Visit: Payer: Self-pay

## 2019-04-02 ENCOUNTER — Inpatient Hospital Stay: Payer: Medicare Other

## 2019-04-02 ENCOUNTER — Inpatient Hospital Stay (HOSPITAL_BASED_OUTPATIENT_CLINIC_OR_DEPARTMENT_OTHER): Payer: Medicare Other | Admitting: Oncology

## 2019-04-02 ENCOUNTER — Encounter: Payer: Self-pay | Admitting: Oncology

## 2019-04-02 VITALS — BP 130/46 | HR 50 | Temp 96.7°F | Wt 307.6 lb

## 2019-04-02 DIAGNOSIS — N183 Chronic kidney disease, stage 3 unspecified: Secondary | ICD-10-CM

## 2019-04-02 DIAGNOSIS — Z7901 Long term (current) use of anticoagulants: Secondary | ICD-10-CM

## 2019-04-02 DIAGNOSIS — D509 Iron deficiency anemia, unspecified: Secondary | ICD-10-CM

## 2019-04-02 DIAGNOSIS — R001 Bradycardia, unspecified: Secondary | ICD-10-CM

## 2019-04-02 DIAGNOSIS — D631 Anemia in chronic kidney disease: Secondary | ICD-10-CM

## 2019-04-02 NOTE — Progress Notes (Signed)
Hematology/Oncology Follow up visit [facility] [phone] [phone]   Patient Care Team: Sofie Hartigan, MD as PCP - General (Family Medicine) Alisa Graff, FNP as Nurse Practitioner (Family Medicine) Ubaldo Glassing Javier Docker, MD as Consulting Physician (Cardiology) Elease Etienne, MD as Consulting Physician (Endocrinology)  CHIEF COMPLAINTS/REASON FOR VISIT Follow up for treatment of Anemia    HISTORY OF PRESENTING ILLNESS:  Andrea Rivers 63 y.o.  female with past medical history listed as below who was referred by Dr. Holley Raring to me for evaluation and management of anemia.  Patient was recently hospitalized due to acute respiratory failure secondary to diastolic CHF exacerbation. She was initially admitted to ICU, intubated and required mechanical ventilation. She also had acute on chronic kidney failure. Her recent labs showed microcytic anemia with hemoglobin  6.5, MCV 67.9. She received blood transfusion on 02/24/2017. Hemoglobin improved to 8.6. Patient reports fatigue, and a lack of energy. She has some lower extremity edema. She lives at home by herself and is able to do her ADLs. She takes warfarin for A. Fib. Denies any bleeding events, blood in the stool or black stool.  #  left distal femur comminuted fracture after mechanical fall, s/p ORIF done 11/17/17. Hemoglobin dropped to 7.7, s/p 2 units of PRBC transfusion.   INTERVAL HISTORY  63 y.o. female presents for follow up for management of chronic anemia. Patient reports feeling very tired today. Chronic anticoagulation for atrial fibrillation, patient is onCoumadin. Denies hematochezia, hematuria, hematemesis, epistaxis, black tarry stool or easy bruising.  CT 05/09/2018 showed a possible soft tissue density 4 x 7 x 3.7 cm in the left lower rectum/anus questionable fecal impaction versus mass.  Discussed with patient during last visit about follow-up with Dr. Alice Reichert for further  evaluation with a colonoscopy for direct visualization.  Patient agreed at that time but not willing to proceed with additional follow-up at this point. Reports that her bowel movements have been fine recently.  Constipation is well controlled.  Patient reports feeling improvement after getting each IV iron treatments Blood pressure was 130/46, with a pulse rate of 50.  Patient is on losartan, clonidine, torsemide.  She reports that sometimes when she takes clonidine she feels " drunky" She measures her blood pressure at home every day..  Review of Systems  Constitutional: Positive for fatigue. Negative for appetite change, chills and fever.  HENT:   Negative for hearing loss, lump/mass and voice change.   Eyes: Negative for eye problems.  Respiratory: Negative for chest tightness, cough and shortness of breath.   Cardiovascular: Positive for leg swelling. Negative for chest pain.  Gastrointestinal: Negative for abdominal distention, abdominal pain, blood in stool, constipation, diarrhea and nausea.  Endocrine: Negative for hot flashes.  Genitourinary: Negative for difficulty urinating, dysuria, frequency and hematuria.   Musculoskeletal: Negative for arthralgias, back pain, gait problem and neck pain.  Skin: Negative for itching and rash.  Neurological: Negative for dizziness, extremity weakness, gait problem and headaches.  Hematological: Negative for adenopathy. Does not bruise/bleed easily.  Psychiatric/Behavioral: Negative for confusion. The patient is not nervous/anxious.    MEDICAL HISTORY:  Past Medical History:  Diagnosis Date   Anemia    Anxiety    CHF (congestive heart failure) (HCC)    Chronic kidney disease    INSUFFICIENCY   Complication of anesthesia    had to be intubated during cataract surgery   " I could not breath "   COPD (chronic obstructive pulmonary disease) ([place])  Diabetes mellitus without complication ([place])    Dyspnea    DOE   Dysrhythmia    A  FIB   Edema    Fracture of distal femur ([address]) 11/2017   left    GERD (gastroesophageal reflux disease)    History of kidney stones    Hypertension    Hypothyroidism    ABLATION   Iron deficiency anemia 03/31/2017   Neuropathy    Orthopnea    Sleep apnea    CPAP   Vertigo    Wheezing     SURGICAL HISTORY: Past Surgical History:  Procedure Laterality Date   CATARACT EXTRACTION W/PHACO Left 01/26/2017   Procedure: CATARACT EXTRACTION PHACO AND INTRAOCULAR LENS PLACEMENT ([place]);  Surgeon: Estill Cotta, MD;  Location: [facility] ORS;  Service: Ophthalmology;  Laterality: Left;  [place]   1:02.2 AP     23.7 CDE   28.92 fluid pack lot# [id number] H exp.05/08/2018   CHOLECYSTECTOMY     ESOPHAGOGASTRODUODENOSCOPY N/A 10/12/2018   Procedure: ESOPHAGOGASTRODUODENOSCOPY (EGD);  Surgeon: Toledo, Benay Pike, MD;  Location: [facility] ENDOSCOPY;  Service: Gastroenterology;  Laterality: N/A;   ORIF FEMUR FRACTURE Left 11/17/2017   Procedure: OPEN REDUCTION INTERNAL FIXATION (ORIF) DISTAL FEMUR FRACTURE;  Surgeon: Shona Needles, MD;  Location: [facility];  Service: Orthopedics;  Laterality: Left;   TONSILLECTOMY      SOCIAL HISTORY: Social History   Socioeconomic History   Marital status: Married    Spouse name: Not on file   Number of children: Not on file   Years of education: Not on file   Highest education level: Not on file  Occupational History   Not on file  Social Needs   Financial resource strain: Not on file   Food insecurity    Worry: Not on file    Inability: Not on file   Transportation needs    Medical: Not on file    Non-medical: Not on file  Tobacco Use   Smoking status: Former Smoker    Packs/day: 3.00    Types: Cigarettes    Quit date: 67    Years since quitting: 32.6   Smokeless tobacco: Never Used  Substance and Sexual Activity   Alcohol use: No   Drug use: No   Sexual activity: Not on file  Lifestyle   Physical activity    Days per week:  Not on file    Minutes per session: Not on file   Stress: Not on file  Relationships   Social connections    Talks on phone: Not on file    Gets together: Not on file    Attends religious service: Not on file    Active member of club or organization: Not on file    Attends meetings of clubs or organizations: Not on file    Relationship status: Not on file   Intimate partner violence    Fear of current or ex partner: Not on file    Emotionally abused: Not on file    Physically abused: Not on file    Forced sexual activity: Not on file  Other Topics Concern   Not on file  Social History Narrative   Not on file    FAMILY HISTORY: Family History  Problem Relation Age of Onset   COPD Mother    Heart disease Mother    Anemia Mother    Heart disease Father    COPD Father    Anemia Sister    Diabetes Maternal Grandmother    Hypertension  Paternal Grandfather     ALLERGIES:  is allergic to other; latex; exenatide; garlic; onion; rosiglitazone; and erythromycin.  MEDICATIONS:  Current Outpatient Medications  Medication Sig Dispense Refill   acetaminophen (TYLENOL) 500 MG tablet Take 1,000 mg by mouth 2 (two) times daily as needed for mild pain, fever or headache.      albuterol (PROAIR HFA) 108 (90 Base) MCG/ACT inhaler Inhale 2 puffs into the lungs every 4 (four) hours as needed for wheezing or shortness of breath.      amiodarone (PACERONE) 200 MG tablet Take 200 mg by mouth daily.  0   Biotin 10 MG CAPS Take 1 capsule by mouth daily.      Calcium Carbonate-Vitamin D3 (CALCIUM 600-D) 600-400 MG-UNIT TABS Take 1 tablet by mouth 3 (three) times daily with meals.      cetirizine (ZYRTEC) 10 MG tablet Take 10 mg by mouth daily.     Cholecalciferol (D3 HIGH POTENCY) 125 MCG (5000 UT) capsule Take 5,000 Units by mouth daily.      citalopram (CELEXA) 40 MG tablet Take 40 mg by mouth daily.      clonazePAM (KLONOPIN) 1 MG tablet Take 1 tablet by mouth 2 (two)  times daily as needed for anxiety.      cloNIDine (CATAPRES) 0.1 MG tablet Take 1 tablet (0.1 mg total) by mouth daily. 60 tablet 5   Co-Enzyme Q-10 100 MG CAPS Take 100 mg by mouth daily.      cyclobenzaprine (FLEXERIL) 5 MG tablet Take 5 mg by mouth 3 (three) times daily as needed for muscle spasms.     docusate sodium (COLACE) 50 MG capsule Take 100-200 mg by mouth 2 (two) times daily. 225m in the morning and 1063min the evening     fluticasone (FLONASE) 50 MCG/ACT nasal spray SHAKE LQ AND U 1 SPR IEN QD     Fluticasone-Salmeterol (WIXELA INHUB) 250-50 MCG/DOSE AEPB Inhale 1 puff into the lungs 2 (two) times daily.     gabapentin (NEURONTIN) 100 MG capsule Take 100 mg by mouth 3 (three) times daily.     Garlic [id number] CAPS Take 1,000 mg by mouth daily.      glimepiride (AMARYL) 2 MG tablet Take 2 mg by mouth daily with breakfast.     HYDROcodone-acetaminophen (NORCO) 10-325 MG tablet Take 1 tablet by mouth every 6 (six) hours as needed for severe pain.      Insulin Detemir (LEVEMIR FLEXTOUCH) 100 UNIT/ML Pen Inject 22 Units into the skin at bedtime.      insulin lispro (HUMALOG) 100 UNIT/ML injection Inject 15-20 Units into the skin 3 (three) times daily before meals.      levothyroxine (SYNTHROID, LEVOTHROID) 175 MCG tablet Take 175 mcg by mouth every evening.      linagliptin (TRADJENTA) 5 MG TABS tablet take 1 tablet by mouth once daily     losartan (COZAAR) 25 MG tablet Take 1 tablet (25 mg total) by mouth daily. 90 tablet 3   lovastatin (MEVACOR) 20 MG tablet Take 20 mg by mouth at bedtime.     magnesium oxide (MAG-OX) 400 MG tablet Take 400 mg by mouth 2 (two) times daily.     metolazone (ZAROXOLYN) 5 MG tablet Take 5 mg by mouth as needed (for a weight gain of 2 pounds overnight or 5 pounds in a week; MAX OF 5 TABLETS AT A TIME).      Multiple Vitamins-Minerals (CENTRUM WOMEN) TABS Take 1 tablet by mouth daily.  Omega-3 Fatty Acids (FISH OIL) 1000 MG CPDR Take  1 capsule by mouth daily.     pantoprazole (PROTONIX) 40 MG tablet Take 1 tablet (40 mg total) by mouth 2 (two) times daily before a meal. 60 tablet 1   potassium chloride SA (K-DUR) 20 MEQ tablet Take 1 tablet (20 mEq total) by mouth 2 (two) times daily. Take 20 mEq twice daily and double dose when taking Metolazole 200 tablet 3   senna (SENOKOT) 8.6 MG TABS tablet Take 8.6-17.2 mg by mouth 2 (two) times daily. 17.21m (2 tablets) in the morning and 8.641min the evening     torsemide (DEMADEX) 20 MG tablet TAKE 2 TABLETS BY MOUTH TWICE A DAY (Patient taking differently: Take 40 mg by mouth 2 (two) times daily. ) 360 tablet 3   vitamin C (ASCORBIC ACID) 500 MG tablet Take 500 mg by mouth daily.     warfarin (COUMADIN) 5 MG tablet Take 5 mg by mouth daily at 6 PM.   0   No current facility-administered medications for this visit.       . Marland KitchenPHYSICAL EXAMINATION: ECOG PERFORMANCE STATUS: 2 - Symptomatic, <50% confined to bed Vitals:   04/02/19 1336  BP: (!) 130/46  Pulse: (!) 50  Temp: (!) 96.7 F (35.9 C)   Filed Weights   04/02/19 1336  Weight: (!) 307 lb 9 oz (139.5 kg)   Physical Exam  Constitutional: She is oriented to person, place, and time. No distress.  Walks with walker, morbidly obese.   HENT:  Head: Normocephalic and atraumatic.  Mouth/Throat: No oropharyngeal exudate.  Eyes: Pupils are equal, round, and reactive to light. Conjunctivae and EOM are normal. Right eye exhibits no discharge. Left eye exhibits no discharge. No scleral icterus.  Neck: Normal range of motion. Neck supple. No JVD present.  Cardiovascular: Regular rhythm and normal heart sounds.  No murmur heard. Bradycardia  Pulmonary/Chest: Effort normal and breath sounds normal. No respiratory distress. She has no wheezes. She has no rales. She exhibits no tenderness.  Abdominal: Soft. Bowel sounds are normal. She exhibits no distension and no mass. There is no abdominal tenderness. There is no rebound.    Musculoskeletal: Normal range of motion.        General: No tenderness or edema.     Comments: Chronic 1+ edema,    Lymphadenopathy:    She has no cervical adenopathy.  Neurological: She is alert and oriented to person, place, and time. No cranial nerve deficit. She exhibits normal muscle tone. Coordination normal.  Skin: Skin is warm and dry. No rash noted. She is not diaphoretic. No erythema.  Bilateral LE vein insufficiency skin changes.  Scattered bruises on bilateral upper extremities  Psychiatric: Affect and judgment normal.     LABORATORY DATA:  I have reviewed the data as listed CBC Latest Ref Rng & Units 03/23/2019 12/20/2018 11/06/2018  WBC 4.0 - 10.5 K/uL 10.8(H) 11.7(H) 14.7(H)  Hemoglobin 12.0 - 15.0 g/dL 12.0 10.7(L) 12.9  Hematocrit 36.0 - 46.0 % 39.2 36.3 42.7  Platelets 150 - 400 K/uL 214 281 228   Recent Labs    11/06/18 1738 12/20/18 1100 03/23/19 0942  NA 143 139 139  K 4.4 3.7 3.1*  CL 98 91* 91*  CO2 30 33* 34*  GLUCOSE 188* 100* 78  BUN 45* 39* 80*  CREATININE 2.02* 1.83* 2.92*  CALCIUM 8.9 9.4 9.8  GFRNONAA 26* 29* 16*  GFRAA 30* 33* 19*  PROT 8.4* 8.4* 8.4*  ALBUMIN 3.9 3.5 3.9  AST _0 ALT _1 ALKPHOS 80 83 76  BILITOT 0.6 0.7 0.5     Labs obtained at the nephrologist office showed Peripheral blood Protein electrophoresis revealed no monoclonal spike. Urine protein electrophoresis negative for monoclonal spike. Hemoglobin 7.9, WBC 12.1, MCV 72, RDW 19, platelet 283,000, neutrophil 9.2, lymphocyte 1.6, monocyte 1.0, a ANA negative.  ASSESSMENT & PLAN:  1. Anemia of chronic kidney failure, stage 3 (moderate) ([place])   2. Iron deficiency anemia, unspecified iron deficiency anemia type   3. Chronic anticoagulation   4. Bradycardia    #Labs reviewed and discussed with patient. Hemoglobin remained stable. Iron panel was reviewed showed borderline iron saturation  #Labs reviewed and discussed with patient.  Both ferritin and  hemoglobin level have been stable.  Iron panel showed borderline iron saturation of 11. Reticulocyte count showed low reticulocyte hemoglobin level. Given that patient has chronic kidney disease, on anticoagulation with possible chronic GI blood loss, history of severe iron deficiency, I recommend 1 dose of Venofer to further stabilize her iron store. Patient had denied GI referral for work-up. Since her blood pressure was borderline and heart rate was low.  I suggest patient to hold IV iron today.  Reschedule Venofer infusion in 2 weeks.  #Bradycardia, advised patient to monitor closely and call her cardiologist for follow-up. #Increased total protein level.  In the context of CKD, worsening of creatinine level, I recommend checking multiple myeloma panel and light chain ratio. . Orders Placed This Encounter  Procedures   Multiple Myeloma Panel (SPEP&IFE w/QIG)    Standing Status:   Future    Number of Occurrences:   1    Standing Expiration Date:   04/01/2020   Kappa/lambda light chains    Standing Status:   Future    Number of Occurrences:   1    Standing Expiration Date:   04/01/2020   CBC with Differential/Platelet    Standing Status:   Future    Standing Expiration Date:   04/01/2020   Ferritin    Standing Status:   Future    Standing Expiration Date:   04/01/2020   Iron and TIBC    Standing Status:   Future    Standing Expiration Date:   04/01/2020   Comprehensive metabolic panel    Standing Status:   Future    Standing Expiration Date:   04/01/2020    Return of visit: 3 months.  with CBC, TIBC, ferritin done one or 2 days prior to, possible Venofer.    Earlie Server, MD, PhD Hematology Oncology [facility] at [facility] Pager- [phone] 04/02/2019

## 2019-04-03 LAB — KAPPA/LAMBDA LIGHT CHAINS
Kappa free light chain: 133.2 mg/L — ABNORMAL HIGH (ref 3.3–19.4)
Kappa, lambda light chain ratio: 1.69 — ABNORMAL HIGH (ref 0.26–1.65)
Lambda free light chains: 78.9 mg/L — ABNORMAL HIGH (ref 5.7–26.3)

## 2019-04-03 LAB — MULTIPLE MYELOMA PANEL, SERUM
Albumin SerPl Elph-Mcnc: 3.6 g/dL (ref 2.9–4.4)
Albumin/Glob SerPl: 1 (ref 0.7–1.7)
Alpha 1: 0.3 g/dL (ref 0.0–0.4)
Alpha2 Glob SerPl Elph-Mcnc: 1.2 g/dL — ABNORMAL HIGH (ref 0.4–1.0)
B-Globulin SerPl Elph-Mcnc: 1.2 g/dL (ref 0.7–1.3)
Gamma Glob SerPl Elph-Mcnc: 1.3 g/dL (ref 0.4–1.8)
Globulin, Total: 4 g/dL — ABNORMAL HIGH (ref 2.2–3.9)
IgA: 571 mg/dL — ABNORMAL HIGH (ref 87–352)
IgG (Immunoglobin G), Serum: 1496 mg/dL (ref 586–1602)
IgM (Immunoglobulin M), Srm: 34 mg/dL (ref 26–217)
Total Protein ELP: 7.6 g/dL (ref 6.0–8.5)

## 2019-04-18 ENCOUNTER — Other Ambulatory Visit: Payer: Self-pay

## 2019-04-18 ENCOUNTER — Inpatient Hospital Stay: Payer: Medicare Other | Attending: Oncology

## 2019-04-18 VITALS — BP 133/65 | HR 64

## 2019-04-18 DIAGNOSIS — N183 Chronic kidney disease, stage 3 (moderate): Secondary | ICD-10-CM | POA: Diagnosis not present

## 2019-04-18 DIAGNOSIS — D509 Iron deficiency anemia, unspecified: Secondary | ICD-10-CM

## 2019-04-18 DIAGNOSIS — D631 Anemia in chronic kidney disease: Secondary | ICD-10-CM | POA: Diagnosis present

## 2019-04-18 MED ORDER — IRON SUCROSE 20 MG/ML IV SOLN
200.0000 mg | Freq: Once | INTRAVENOUS | Status: AC
  Administered 2019-04-18: 200 mg via INTRAVENOUS
  Filled 2019-04-18: qty 10

## 2019-04-18 MED ORDER — SODIUM CHLORIDE 0.9 % IV SOLN
Freq: Once | INTRAVENOUS | Status: AC
  Administered 2019-04-18: 14:00:00 via INTRAVENOUS
  Filled 2019-04-18: qty 250

## 2019-05-16 ENCOUNTER — Ambulatory Visit: Payer: Medicare Other | Admitting: Family

## 2019-05-30 ENCOUNTER — Other Ambulatory Visit: Payer: Self-pay

## 2019-05-30 ENCOUNTER — Encounter: Payer: Self-pay | Admitting: Family

## 2019-05-30 ENCOUNTER — Ambulatory Visit: Payer: Medicare Other | Attending: Family | Admitting: Family

## 2019-05-30 VITALS — BP 119/44 | HR 48 | Resp 18 | Ht 64.0 in | Wt 315.0 lb

## 2019-05-30 DIAGNOSIS — E1122 Type 2 diabetes mellitus with diabetic chronic kidney disease: Secondary | ICD-10-CM

## 2019-05-30 DIAGNOSIS — F419 Anxiety disorder, unspecified: Secondary | ICD-10-CM | POA: Insufficient documentation

## 2019-05-30 DIAGNOSIS — Z794 Long term (current) use of insulin: Secondary | ICD-10-CM | POA: Diagnosis not present

## 2019-05-30 DIAGNOSIS — N189 Chronic kidney disease, unspecified: Secondary | ICD-10-CM | POA: Diagnosis not present

## 2019-05-30 DIAGNOSIS — Z87891 Personal history of nicotine dependence: Secondary | ICD-10-CM | POA: Diagnosis not present

## 2019-05-30 DIAGNOSIS — Z881 Allergy status to other antibiotic agents status: Secondary | ICD-10-CM | POA: Diagnosis not present

## 2019-05-30 DIAGNOSIS — I890 Lymphedema, not elsewhere classified: Secondary | ICD-10-CM | POA: Diagnosis not present

## 2019-05-30 DIAGNOSIS — Z8249 Family history of ischemic heart disease and other diseases of the circulatory system: Secondary | ICD-10-CM | POA: Insufficient documentation

## 2019-05-30 DIAGNOSIS — Z7951 Long term (current) use of inhaled steroids: Secondary | ICD-10-CM | POA: Diagnosis not present

## 2019-05-30 DIAGNOSIS — K219 Gastro-esophageal reflux disease without esophagitis: Secondary | ICD-10-CM | POA: Insufficient documentation

## 2019-05-30 DIAGNOSIS — I5032 Chronic diastolic (congestive) heart failure: Secondary | ICD-10-CM | POA: Diagnosis present

## 2019-05-30 DIAGNOSIS — E039 Hypothyroidism, unspecified: Secondary | ICD-10-CM | POA: Diagnosis not present

## 2019-05-30 DIAGNOSIS — I10 Essential (primary) hypertension: Secondary | ICD-10-CM

## 2019-05-30 DIAGNOSIS — Z7901 Long term (current) use of anticoagulants: Secondary | ICD-10-CM | POA: Insufficient documentation

## 2019-05-30 DIAGNOSIS — J449 Chronic obstructive pulmonary disease, unspecified: Secondary | ICD-10-CM | POA: Insufficient documentation

## 2019-05-30 DIAGNOSIS — E1140 Type 2 diabetes mellitus with diabetic neuropathy, unspecified: Secondary | ICD-10-CM | POA: Insufficient documentation

## 2019-05-30 DIAGNOSIS — G4733 Obstructive sleep apnea (adult) (pediatric): Secondary | ICD-10-CM | POA: Diagnosis not present

## 2019-05-30 DIAGNOSIS — I130 Hypertensive heart and chronic kidney disease with heart failure and stage 1 through stage 4 chronic kidney disease, or unspecified chronic kidney disease: Secondary | ICD-10-CM | POA: Diagnosis not present

## 2019-05-30 DIAGNOSIS — Z833 Family history of diabetes mellitus: Secondary | ICD-10-CM | POA: Insufficient documentation

## 2019-05-30 DIAGNOSIS — I4891 Unspecified atrial fibrillation: Secondary | ICD-10-CM | POA: Diagnosis not present

## 2019-05-30 DIAGNOSIS — Z79899 Other long term (current) drug therapy: Secondary | ICD-10-CM | POA: Diagnosis not present

## 2019-05-30 DIAGNOSIS — Z87442 Personal history of urinary calculi: Secondary | ICD-10-CM | POA: Insufficient documentation

## 2019-05-30 NOTE — Progress Notes (Signed)
Patient ID: Andrea Rivers, female    DOB: 1955/11/26, 63 y.o.   MRN: [id number]  HPI  Andrea Rivers is a 63 y/o female with a history of anemia, anxiety, CKD, COPD, DM, GERD, HTN, hypothyroidism, neuropathy, obstructive sleep apnea (+CPAP), previous tobacco use and chronic heart failure.  Echo report from 10/13/2018 reviewed and showed an EF of 55-60%. Echo report from 02/23/17 reviewed and showed an EF of 55-60% along with mild MR. Echo done 01/26/17 shows an EF of 50-55%.   No ED visits or admissions in the last 6 months  She presents today for a follow-up visit with a chief complaint of shortness of breath on moderate exertion. She stated that she gets short of breath wearing a mask and using a walker for longer distances. She used a wheelchair from the [facility] today. This is associated with fatigue, leg swelling, shoulder pain, and dizziness due to vertigo. She weighs herself every day and noted her legs to have more swelling than normal. She took a metolazone tablet last night and will take one as needed for 5 days until the swelling goes down. She also checks her blood pressure and heart rate daily. She states that her blood pressure has been better since taking clonidine twice a day and losartan at lunch time. She notes that her heart rate runs between 45-72. She saw cardiology last week who did not make any changes to her medications. She does not add salt to foods and reads the nutrition label for sodium content.    Past Medical History:  Diagnosis Date  . Anemia   . Anxiety   . CHF (congestive heart failure) ([place])   . Chronic kidney disease    INSUFFICIENCY  . Complication of anesthesia    had to be intubated during cataract surgery   " I could not breath "  . COPD (chronic obstructive pulmonary disease) ([place])   . Diabetes mellitus without complication ([place])   . Dyspnea    DOE  . Dysrhythmia    A FIB  . Edema   . Fracture of distal femur ([place]) 11/2017   left   . GERD  (gastroesophageal reflux disease)   . History of kidney stones   . Hypertension   . Hypothyroidism    ABLATION  . Iron deficiency anemia 03/31/2017  . Neuropathy   . Orthopnea   . Sleep apnea    CPAP  . Vertigo   . Wheezing    Past Surgical History:  Procedure Laterality Date  . CATARACT EXTRACTION W/PHACO Left 01/26/2017   Procedure: CATARACT EXTRACTION PHACO AND INTRAOCULAR LENS PLACEMENT (IOC);  Surgeon: Estill Cotta, MD;  Location: [facility];  Service: Ophthalmology;  Laterality: Left;  [place]   1:02.2 AP     23.7 CDE   28.92 fluid pack lot# [id number] H exp.05/08/2018  . CHOLECYSTECTOMY    . ESOPHAGOGASTRODUODENOSCOPY N/A 10/12/2018   Procedure: ESOPHAGOGASTRODUODENOSCOPY (EGD);  Surgeon: Toledo, Benay Pike, MD;  Location: [facility];  Service: Gastroenterology;  Laterality: N/A;  . ORIF FEMUR FRACTURE Left 11/17/2017   Procedure: OPEN REDUCTION INTERNAL FIXATION (ORIF) DISTAL FEMUR FRACTURE;  Surgeon: Shona Needles, MD;  Location: [place];  Service: Orthopedics;  Laterality: Left;  . TONSILLECTOMY     Family History  Problem Relation Age of Onset  . COPD Mother   . Heart disease Mother   . Anemia Mother   . Heart disease Father   . COPD Father   . Anemia Sister   . Diabetes  Maternal Grandmother   . Hypertension Paternal Grandfather    Social History   Tobacco Use  . Smoking status: Former Smoker    Packs/day: 3.00    Types: Cigarettes    Quit date: 1988    Years since quitting: 32.8  . Smokeless tobacco: Never Used  Substance Use Topics  . Alcohol use: No   Allergies  Allergen Reactions  . Other Other (See Comments)    ILOSONE- Caused GI distress also  . Latex Hives  . Exenatide Nausea Only and Other (See Comments)    [place]- Nausea and abdominal pain, also  . Garlic Other (See Comments)    Severe acid reflux  . Onion Other (See Comments)    Severe acid reflux  . Rosiglitazone Other (See Comments)    [place]- Affected heart  . Erythromycin Nausea And  Vomiting    GI DISTRESS   Prior to Admission medications   Medication Sig Start Date End Date Taking? Authorizing Provider  acetaminophen (TYLENOL) 500 MG tablet Take 1,000 mg by mouth 2 (two) times daily as needed for mild pain, fever or headache.    Yes [provider]  albuterol (PROAIR HFA) 108 (90 Base) MCG/ACT inhaler Inhale 2 puffs into the lungs every 4 (four) hours as needed for wheezing or shortness of breath.  03/14/17  Yes [provider]  amiodarone (PACERONE) 200 MG tablet Take 200 mg by mouth daily. 05/10/17  Yes [provider]  Biotin 10 MG CAPS Take 1 capsule by mouth daily.    Yes [provider]  Calcium Carbonate-Vitamin D3 (CALCIUM 600-D) 600-400 MG-UNIT TABS Take 1 tablet by mouth 3 (three) times daily with meals.    Yes [provider]  cetirizine (ZYRTEC) 10 MG tablet Take 10 mg by mouth daily.   Yes [provider]  Cholecalciferol (D3 HIGH POTENCY) 125 MCG (5000 UT) capsule Take 5,000 Units by mouth daily.    Yes [provider]  citalopram (CELEXA) 40 MG tablet Take 40 mg by mouth daily.    Yes [provider]  clonazePAM (KLONOPIN) 1 MG tablet Take 1 tablet by mouth 2 (two) times daily as needed for anxiety.  05/17/18  Yes [provider]  cloNIDine (CATAPRES) 0.1 MG tablet Take 1 tablet (0.1 mg total) by mouth daily. Patient taking differently: Take 0.1 mg by mouth 2 (two) times daily.  02/06/19  Yes Hackney, Tina A, FNP  Co-Enzyme Q-10 100 MG CAPS Take 100 mg by mouth daily.    Yes [provider]  cyclobenzaprine (FLEXERIL) 5 MG tablet Take 5 mg by mouth 3 (three) times daily as needed for muscle spasms.   Yes [provider]  docusate sodium (COLACE) 50 MG capsule Take 100-200 mg by mouth 2 (two) times daily. 200mg  in the morning and 100mg  in the evening   Yes [provider]  fluticasone (FLONASE) 50 MCG/ACT nasal spray SHAKE LQ AND U 1 SPR IEN QD 12/04/18  Yes  [provider]  Fluticasone-Salmeterol (WIXELA INHUB) 250-50 MCG/DOSE AEPB Inhale 1 puff into the lungs 2 (two) times daily.   Yes [provider]  gabapentin (NEURONTIN) 100 MG capsule Take 100 mg by mouth 3 (three) times daily.   Yes [provider]  Garlic 123XX123 MG CAPS Take 1,000 mg by mouth daily.    Yes [provider]  glimepiride (AMARYL) 2 MG tablet Take 2 mg by mouth daily with breakfast.   Yes [provider]  HYDROcodone-acetaminophen ([place]) 10-325  MG tablet Take 1 tablet by mouth every 6 (six) hours as needed for severe pain.    Yes [provider]  Insulin Detemir (LEVEMIR FLEXTOUCH) 100 UNIT/ML Pen Inject 22 Units into the skin at bedtime.  03/01/18  Yes [provider]  insulin lispro (HUMALOG) 100 UNIT/ML injection Inject 15-20 Units into the skin 3 (three) times daily before meals.    Yes [provider]  levothyroxine (SYNTHROID, LEVOTHROID) 175 MCG tablet Take 175 mcg by mouth every evening.    Yes [provider]  linagliptin (TRADJENTA) 5 MG TABS tablet take 1 tablet by mouth once daily 01/03/18  Yes [provider]  losartan (COZAAR) 25 MG tablet Take 1 tablet (25 mg total) by mouth daily. 02/06/19 05/30/19 Yes Hackney, Tina A, FNP  lovastatin (MEVACOR) 20 MG tablet Take 20 mg by mouth at bedtime.   Yes [provider]  magnesium oxide (MAG-OX) 400 MG tablet Take 400 mg by mouth 2 (two) times daily.   Yes [provider]  metolazone (ZAROXOLYN) 5 MG tablet Take 5 mg by mouth as needed (for a weight gain of 2 pounds overnight or 5 pounds in a week; MAX OF 5 TABLETS AT A TIME).  04/18/17  Yes [provider]  Multiple Vitamins-Minerals (CENTRUM WOMEN) TABS Take 1 tablet by mouth daily.    Yes [provider]  Omega-3 Fatty Acids (FISH OIL) 1000 MG CPDR Take 1 capsule by mouth daily.   Yes [provider]  pantoprazole (PROTONIX) 40 MG tablet Take 1  tablet (40 mg total) by mouth 2 (two) times daily before a meal. 10/12/18  Yes Fritzi Mandes, MD  potassium chloride SA (K-DUR) 20 MEQ tablet Take 1 tablet (20 mEq total) by mouth 2 (two) times daily. Take 20 mEq twice daily and double dose when taking Metolazole 03/22/19  Yes Hackney, Aura Fey, FNP  senna (SENOKOT) 8.6 MG TABS tablet Take 8.6-17.2 mg by mouth 2 (two) times daily. 17.2mg  (2 tablets) in the morning and 8.6mg  in the evening   Yes [provider]  torsemide (DEMADEX) 20 MG tablet TAKE 2 TABLETS BY MOUTH TWICE A DAY Patient taking differently: Take 40 mg by mouth 2 (two) times daily.  09/07/18  Yes Hackney, Otila Kluver A, FNP  vitamin C (ASCORBIC ACID) 500 MG tablet Take 500 mg by mouth daily.   Yes [provider]  warfarin (COUMADIN) 5 MG tablet Take 5 mg by mouth daily at 6 PM.  04/22/17  Yes [provider]   Review of Systems  Constitutional: Positive for fatigue. Negative for appetite change.  HENT: Negative for congestion, postnasal drip and sore throat.   Eyes: Positive for visual disturbance (blurry vision in left eye). Negative for pain.  Respiratory: Positive for shortness of breath (on moderate exertion). Negative for cough and chest tightness.   Cardiovascular: Positive for leg swelling. Negative for chest pain and palpitations.  Gastrointestinal: Negative for abdominal distention and abdominal pain.  Endocrine: Negative.   Genitourinary: Negative.   Musculoskeletal: Positive for arthralgias (right shoulder) and back pain.  Skin: Negative.   Allergic/Immunologic: Negative.   Neurological: Positive for dizziness (due to vertigo). Negative for weakness and light-headedness.  Hematological: Negative for adenopathy. Bruises/bleeds easily.  Psychiatric/Behavioral: Positive for dysphoric mood. Negative for sleep disturbance (sleeping on 3 pillows). The patient is not nervous/anxious.    Vitals:   05/30/19 0900  BP: (!) 119/44  Pulse: (!) 48  Resp: 18  SpO2:  99%   Filed  Weights   05/30/19 0900  Weight: (!) 315 lb (142.9 kg)   Lab Results  Component Value Date   CREATININE 2.92 (H) 03/23/2019   CREATININE 1.83 (H) 12/20/2018   CREATININE 2.02 (H) 11/06/2018   Physical Exam  Constitutional: She is oriented to person, place, and time. She appears well-developed and well-nourished.  HENT:  Head: Normocephalic and atraumatic.  Neck: Normal range of motion. Neck supple. No JVD present.  Cardiovascular: Regular rhythm. Bradycardia present.  Pulmonary/Chest: Effort normal. She has no wheezes. She has no rales.  Abdominal: Soft. She exhibits no distension. There is no abdominal tenderness.  Musculoskeletal:        General: Edema (2+ pitting edema in bilateral lower legs) present. No tenderness.  Neurological: She is alert and oriented to person, place, and time.  Skin: Skin is warm and dry.  Psychiatric: She has a normal mood and affect. Her behavior is normal. Thought content normal.  Nursing note and vitals reviewed.   Assessment & Plan:  1: Chronic heart failure with preserved ejection fraction- - NYHA class II - euvolemic today - weighing daily; reminded to call for an overnight weight gain of >2 pounds or a weekly weight gain of >5 pounds - weight down 9 pounds from last visit here 10 months ago - she took a metolazone last night and will take as needed up to 5 days total. She will call if this does not help her swelling.  - not adding salt to her food  - saw cardiologist (Fath) 05/23/2019 - BNP 10/12/2018 was 379.0 - reports receiving her flu vaccine for this season  2: HTN- - BP looks good today - BMP 05/23/2019 reviewed and showed sodium 146, potassium 3.4, creatinine 1.8 and GFR 28 - saw PCP Wadie Lessen) 05/23/2019  3: Diabetes- - A1c from 12/25/2018 was 6.5% - saw endocrinologist Honor Junes) 12/25/2018 - saw nephrology Holley Raring) 04/23/2019  4: Lymphedema- - stage 2 - elevating them at times with some improvement;  instructed to keep them elevated when sitting for long periods of time - unable to exercise much due to knee pain but does try to walk with her walker - unable to wear compression socks due to inability to get them on   Patient did not bring her medications nor a list. Each medication was verbally reviewed with the patient and she was encouraged to bring the bottles to every visit to confirm accuracy of list.  Return in 6 months or sooner for any questions/ problems before then.

## 2019-05-30 NOTE — Patient Instructions (Signed)
Continue weighing daily and call for an overnight weight gain of > 2 pounds or a weekly weight gain of >5 pounds. 

## 2019-06-10 ENCOUNTER — Other Ambulatory Visit: Payer: Self-pay | Admitting: Family

## 2019-06-23 ENCOUNTER — Other Ambulatory Visit: Payer: Self-pay

## 2019-06-23 ENCOUNTER — Encounter: Payer: Self-pay | Admitting: Emergency Medicine

## 2019-06-23 ENCOUNTER — Emergency Department: Payer: Medicare Other

## 2019-06-23 ENCOUNTER — Emergency Department
Admission: EM | Admit: 2019-06-23 | Discharge: 2019-06-23 | Disposition: A | Discharge status: Home / Self Care | Payer: Medicare Other | Attending: Emergency Medicine | Admitting: Emergency Medicine

## 2019-06-23 DIAGNOSIS — I5032 Chronic diastolic (congestive) heart failure: Secondary | ICD-10-CM | POA: Insufficient documentation

## 2019-06-23 DIAGNOSIS — Z794 Long term (current) use of insulin: Secondary | ICD-10-CM | POA: Insufficient documentation

## 2019-06-23 DIAGNOSIS — K5900 Constipation, unspecified: Secondary | ICD-10-CM | POA: Diagnosis present

## 2019-06-23 DIAGNOSIS — N189 Chronic kidney disease, unspecified: Secondary | ICD-10-CM | POA: Insufficient documentation

## 2019-06-23 DIAGNOSIS — Z91040 Latex allergy status: Secondary | ICD-10-CM | POA: Insufficient documentation

## 2019-06-23 DIAGNOSIS — Z79899 Other long term (current) drug therapy: Secondary | ICD-10-CM | POA: Diagnosis not present

## 2019-06-23 DIAGNOSIS — I130 Hypertensive heart and chronic kidney disease with heart failure and stage 1 through stage 4 chronic kidney disease, or unspecified chronic kidney disease: Secondary | ICD-10-CM | POA: Insufficient documentation

## 2019-06-23 DIAGNOSIS — E039 Hypothyroidism, unspecified: Secondary | ICD-10-CM | POA: Insufficient documentation

## 2019-06-23 DIAGNOSIS — E1122 Type 2 diabetes mellitus with diabetic chronic kidney disease: Secondary | ICD-10-CM | POA: Diagnosis not present

## 2019-06-23 DIAGNOSIS — Z87891 Personal history of nicotine dependence: Secondary | ICD-10-CM | POA: Insufficient documentation

## 2019-06-23 DIAGNOSIS — J449 Chronic obstructive pulmonary disease, unspecified: Secondary | ICD-10-CM | POA: Diagnosis not present

## 2019-06-23 MED ORDER — POLYETHYLENE GLYCOL 3350 17 G PO PACK: 17.0000 g | PACK | Freq: Every day | ORAL | 0 refills | Status: AC

## 2019-06-23 NOTE — ED Provider Notes (Signed)
[facility] Emergency Department Provider Note  ____________________________________________  Time seen: Approximately 6:14 PM  I have reviewed the triage vital signs and the nursing notes.   HISTORY  Chief Complaint Constipation    HPI Andrea Rivers is a 63 y.o. female presents to the emergency department with concern for constipation and a need for disimpaction.  Patient states that she has not had a bowel movement in the past 3 days.  She has a sense of rectal fullness.  She denies abdominal pain or pressure.  No fever at home.  Patient states that she has been taking a stool softener twice daily.  No other alleviating measures have been attempted.        Past Medical History:  Diagnosis Date  . Anemia   . Anxiety   . CHF (congestive heart failure) ([place])   . Chronic kidney disease    INSUFFICIENCY  . Complication of anesthesia    had to be intubated during cataract surgery   " I could not breath "  . COPD (chronic obstructive pulmonary disease) ([place])   . Diabetes mellitus without complication ([place])   . Dyspnea    DOE  . Dysrhythmia    A FIB  . Edema   . Fracture of distal femur ([place]) 11/2017   left   . GERD (gastroesophageal reflux disease)   . History of kidney stones   . Hypertension   . Hypothyroidism    ABLATION  . Iron deficiency anemia 03/31/2017  . Neuropathy   . Orthopnea   . Sleep apnea    CPAP  . Vertigo   . Wheezing     Patient Active Problem List   Diagnosis Date Noted  . CHF (congestive heart failure) ([place]) 10/15/2018  . Acute on chronic diastolic CHF (congestive heart failure) ([place]) 10/13/2018  . Hypokalemia 06/08/2018  . Lymphedema 06/08/2018  . Closed displaced supracondylar fracture of distal end of left femur with intracondylar extension ([place]) 11/17/2017  . CKD (chronic kidney disease) 11/14/2017  . Hypothyroidism 11/14/2017  . Atrial fibrillation ([place]) 11/14/2017  . Anxiety state 11/14/2017  . Peripheral  neuropathy 11/14/2017  . Bradycardia 08/13/2017  . Microcytic anemia 03/31/2017  . Iron deficiency anemia 03/31/2017  . Chronic diastolic heart failure ([place]) 03/10/2017  . HTN (hypertension) 03/10/2017  . Diabetes ([place]) 03/10/2017  . Obstructive sleep apnea 03/10/2017    Past Surgical History:  Procedure Laterality Date  . CATARACT EXTRACTION W/PHACO Left 01/26/2017   Procedure: CATARACT EXTRACTION PHACO AND INTRAOCULAR LENS PLACEMENT (IOC);  Surgeon: Estill Cotta, MD;  Location: [facility] ORS;  Service: Ophthalmology;  Laterality: Left;  [place]   1:02.2 AP     23.7 CDE   28.92 fluid pack lot# [id number] H exp.05/08/2018  . CHOLECYSTECTOMY    . ESOPHAGOGASTRODUODENOSCOPY N/A 10/12/2018   Procedure: ESOPHAGOGASTRODUODENOSCOPY (EGD);  Surgeon: Toledo, Benay Pike, MD;  Location: [facility];  Service: Gastroenterology;  Laterality: N/A;  . ORIF FEMUR FRACTURE Left 11/17/2017   Procedure: OPEN REDUCTION INTERNAL FIXATION (ORIF) DISTAL FEMUR FRACTURE;  Surgeon: Shona Needles, MD;  Location: [place];  Service: Orthopedics;  Laterality: Left;  . TONSILLECTOMY      Prior to Admission medications   Medication Sig Start Date End Date Taking? Authorizing Provider  acetaminophen (TYLENOL) 500 MG tablet Take 1,000 mg by mouth 2 (two) times daily as needed for mild pain, fever or headache.     [provider]  albuterol (PROAIR HFA) 108 (90 Base) MCG/ACT inhaler Inhale 2 puffs into  the lungs every 4 (four) hours as needed for wheezing or shortness of breath.  03/14/17   [provider]  amiodarone (PACERONE) 200 MG tablet Take 200 mg by mouth daily. 05/10/17   [provider]  Biotin 10 MG CAPS Take 1 capsule by mouth daily.     [provider]  Calcium Carbonate-Vitamin D3 (CALCIUM 600-D) 600-400 MG-UNIT TABS Take 1 tablet by mouth 3 (three) times daily with meals.     [provider]  cetirizine (ZYRTEC) 10 MG tablet Take 10 mg by mouth daily.    [provider]  Cholecalciferol (D3 HIGH POTENCY) 125 MCG (5000 UT) capsule Take 5,000 Units by mouth daily.     [provider]  citalopram (CELEXA) 40 MG tablet Take 40 mg by mouth daily.     [provider]  clonazePAM (KLONOPIN) 1 MG tablet Take 1 tablet by mouth 2 (two) times daily as needed for anxiety.  05/17/18   [provider]  cloNIDine (CATAPRES) 0.1 MG tablet Take 1 tablet (0.1 mg total) by mouth daily. Patient taking differently: Take 0.1 mg by mouth 2 (two) times daily.  02/06/19   Darylene Price A, FNP  Co-Enzyme Q-10 100 MG CAPS Take 100 mg by mouth daily.     [provider]  cyclobenzaprine (FLEXERIL) 5 MG tablet Take 5 mg by mouth 3 (three) times daily as needed for muscle spasms.    [provider]  docusate sodium (COLACE) 50 MG capsule Take 100-200 mg by mouth 2 (two) times daily. 200mg  in the morning and 100mg  in the evening    [provider]  fluticasone (FLONASE) 50 MCG/ACT nasal spray SHAKE LQ AND U 1 SPR IEN QD 12/04/18   [provider]  Fluticasone-Salmeterol (WIXELA INHUB) 250-50 MCG/DOSE AEPB Inhale 1 puff into the lungs 2 (two) times daily.    [provider]  gabapentin (NEURONTIN) 100 MG capsule Take 100 mg by mouth 3 (three) times daily.    [provider]  Garlic 123XX123 MG CAPS Take 1,000 mg by mouth daily.     [provider]  glimepiride (AMARYL) 2 MG tablet Take 2 mg by mouth daily with breakfast.    [provider]  HYDROcodone-acetaminophen ([place]) 10-325 MG tablet Take 1 tablet by mouth every 6 (six) hours as needed for severe pain.     [provider]  Insulin Detemir (LEVEMIR FLEXTOUCH) 100 UNIT/ML Pen Inject 22 Units into the skin at bedtime.  03/01/18   [provider]  insulin lispro (HUMALOG) 100 UNIT/ML injection Inject 15-20 Units into the skin 3 (three) times daily before meals.     [provider]  levothyroxine (SYNTHROID,  LEVOTHROID) 175 MCG tablet Take 175 mcg by mouth every evening.     [provider]  linagliptin (TRADJENTA) 5 MG TABS tablet take 1 tablet by mouth once daily 01/03/18   [provider]  losartan (COZAAR) 25 MG tablet Take 1 tablet (25 mg total) by mouth daily. 02/06/19 05/30/19  Alisa Graff, FNP  lovastatin (MEVACOR) 20 MG tablet Take 20 mg by mouth at bedtime.    [provider]  magnesium oxide (MAG-OX) 400 MG tablet Take 400 mg by mouth 2 (two) times daily.    [provider]  metolazone (ZAROXOLYN) 5 MG tablet Take 5 mg by mouth as needed (for a weight gain of 2 pounds overnight or 5 pounds in a week; MAX OF 5 TABLETS AT A TIME).  04/18/17   [provider]  Multiple Vitamins-Minerals (CENTRUM WOMEN) TABS Take 1 tablet by mouth daily.     [provider]  Omega-3 Fatty Acids (FISH OIL) 1000 MG CPDR Take 1 capsule by mouth daily.    [provider]  pantoprazole (PROTONIX) 40 MG tablet Take 1 tablet (40 mg total) by mouth 2 (two) times daily before a meal. 10/12/18   Fritzi Mandes, MD  polyethylene glycol (MIRALAX) 17 g packet Take 17 g by mouth daily. 06/23/19   Lannie Fields, PA-C  potassium chloride SA (K-DUR) 20 MEQ tablet Take 1 tablet (20 mEq total) by mouth 2 (two) times daily. Take 20 mEq twice daily and double dose when taking Metolazole 03/22/19   Darylene Price A, FNP  senna (SENOKOT) 8.6 MG TABS tablet Take 8.6-17.2 mg by mouth 2 (two) times daily. 17.2mg  (2 tablets) in the morning and 8.6mg  in the evening    [provider]  torsemide (DEMADEX) 20 MG tablet TAKE 2 TABLETS BY MOUTH TWICE DAILY 06/10/19   Darylene Price A, FNP  vitamin C (ASCORBIC ACID) 500 MG tablet Take 500 mg by mouth daily.    [provider]  warfarin (COUMADIN) 5 MG tablet Take 5 mg by mouth daily at 6 PM.  04/22/17   [provider]    Allergies Other, Latex, Exenatide, Garlic, Onion, Rosiglitazone, and Erythromycin  Family  History  Problem Relation Age of Onset  . COPD Mother   . Heart disease Mother   . Anemia Mother   . Heart disease Father   . COPD Father   . Anemia Sister   . Diabetes Maternal Grandmother   . Hypertension Paternal Grandfather     Social History Social History   Tobacco Use  . Smoking status: Former Smoker    Packs/day: 3.00    Types: Cigarettes    Quit date: 1988    Years since quitting: 32.8  . Smokeless tobacco: Never Used  Substance Use Topics  . Alcohol use: No  . Drug use: No     Review of Systems  Constitutional: No fever/chills Eyes: No visual changes. No discharge ENT: No upper respiratory complaints. Cardiovascular: no chest pain. Respiratory: no cough. No SOB. Gastrointestinal: Patient has rectal fullness.  No nausea, no vomiting.  No diarrhea.  No constipation. Genitourinary: Negative for dysuria. No hematuria Musculoskeletal: Negative for musculoskeletal pain. Skin: Negative for rash, abrasions, lacerations, ecchymosis. Neurological: Negative for headaches, focal weakness or numbness.  ____________________________________________   PHYSICAL EXAM:  VITAL SIGNS: ED Triage Vitals  Enc Vitals Group     BP 06/23/19 1704 (!) 132/56     Pulse Rate 06/23/19 1704 70     Resp 06/23/19 1704 18     Temp 06/23/19 1704 97.7 F (36.5 C)     Temp Source 06/23/19 1704 Oral     SpO2 06/23/19 1704 93 %     Weight 06/23/19 1708 (!) 303 lb 3.2 oz (137.5 kg)     Height 06/23/19 1708 5\' 5"  (1.651 m)     Head Circumference --      Peak Flow --      Pain Score 06/23/19 1707 10     Pain Loc --      Pain Edu? --      Excl. in [place]? --      Constitutional: Alert and oriented. Well appearing and in no acute distress. Eyes: Conjunctivae are normal. PERRL. EOMI. Head: Atraumatic. ENT: Cardiovascular: Normal rate, regular rhythm.  Normal S1 and S2.  Good peripheral circulation. Respiratory: Normal respiratory effort without tachypnea or retractions. Lungs CTAB. Good  air entry to the bases with no decreased or absent breath sounds. Gastrointestinal: Bowel sounds 4 quadrants. Soft and nontender to palpation. No guarding or rigidity. No palpable masses. No distention. No CVA tenderness. Musculoskeletal: Full range of motion to all extremities. No gross deformities appreciated. Neurologic:  Normal speech and language. No gross focal neurologic deficits are appreciated.  Skin:  Skin is warm, dry and intact. No rash noted. Psychiatric: Mood and affect are normal. Speech and behavior are normal. Patient exhibits appropriate insight and judgement.   ____________________________________________   LABS (all labs ordered are listed, but only abnormal results are displayed)  Labs Reviewed - No data to display ____________________________________________  EKG   ____________________________________________  RADIOLOGY I personally viewed and evaluated these images as part of my medical decision making, as well as reviewing the written report by the radiologist.    Dg Abd 2 Views  Result Date: 06/23/2019 CLINICAL DATA:  No bowel movement for 2 days, initial encounter EXAM: ABDOMEN - 2 VIEW COMPARISON:  11/06/2018 FINDINGS: Scattered large and small bowel gas is noted. No obstructive changes are seen. Mild retained fecal material is noted without obstructive change. Degenerative changes of lumbar spine and hip joints are noted. No other focal abnormality is seen. IMPRESSION: Mild retained fecal material without obstructive change. Electronically Signed   By: Inez Catalina M.D.   On: 06/23/2019 19:03    ____________________________________________    PROCEDURES  Procedure(s) performed:    Procedures    Medications - No data to display   ____________________________________________   INITIAL IMPRESSION / ASSESSMENT AND PLAN / ED COURSE  Pertinent labs & imaging results that were available during my care of the patient were reviewed by me and  considered in my medical decision making (see chart for details).  Review of the [place] CSRS was performed in accordance of the [place] prior to dispensing any controlled drugs.         Assessment and Plan:  Constipation:  63 year old female presents to the emergency department with constipation for the past 3 days.  Abdominal x-rays were obtained which revealed no signs of obstruction.  Patient was disimpacted in the emergency department and she reported that her rectal discomfort improved.  Return precautions were given.  All patient questions were answered.  ____________________________________________  FINAL CLINICAL IMPRESSION(S) / ED DIAGNOSES  Final diagnoses:  Constipation      NEW MEDICATIONS STARTED DURING THIS VISIT:  ED Discharge Orders         Ordered    polyethylene glycol (MIRALAX) 17 g packet  Daily     06/23/19 1900              This chart was dictated using voice recognition software/Dragon. Despite best efforts to proofread, errors can occur which can change the meaning. Any change was purely unintentional.    Lannie Fields, PA-C 06/23/19 1940    Nena Polio, MD 06/23/19 [phone]

## 2019-06-23 NOTE — ED Notes (Signed)
Patient assisted to hallway bathroom via wheelchair. Patient has her clothes and walker with her. Patient was instructed to pull red cord for assistance.

## 2019-06-23 NOTE — ED Triage Notes (Signed)
Patient presents to the ED with constipation.  Patient states she has not had a bowel movement in 2 days.  Patient reports talking milk of magnesia with no improvement.  Patient denies any other symptoms or problems.  Patient is in no obvious distress at this time.

## 2019-06-23 NOTE — ED Notes (Signed)
Pt states she is constipated x 2 days and usually needs disimpacted when this occurs. Pants and underwear removed and pt on chux in the bed.

## 2019-06-23 NOTE — ED Notes (Signed)
Pt wanting disimpacted again before discharge. Kennyth Lose able to get small amount more out. Pt discharged and given instructions.

## 2019-07-24 ENCOUNTER — Other Ambulatory Visit: Payer: Self-pay

## 2019-07-25 ENCOUNTER — Inpatient Hospital Stay: Payer: Medicare Other

## 2019-07-26 ENCOUNTER — Inpatient Hospital Stay: Payer: Medicare Other | Attending: Hematology and Oncology

## 2019-07-26 ENCOUNTER — Other Ambulatory Visit: Payer: Self-pay

## 2019-07-26 DIAGNOSIS — N183 Chronic kidney disease, stage 3 unspecified: Secondary | ICD-10-CM | POA: Diagnosis present

## 2019-07-26 DIAGNOSIS — Z7901 Long term (current) use of anticoagulants: Secondary | ICD-10-CM | POA: Diagnosis not present

## 2019-07-26 DIAGNOSIS — I5032 Chronic diastolic (congestive) heart failure: Secondary | ICD-10-CM | POA: Insufficient documentation

## 2019-07-26 DIAGNOSIS — D631 Anemia in chronic kidney disease: Secondary | ICD-10-CM | POA: Insufficient documentation

## 2019-07-26 DIAGNOSIS — I4891 Unspecified atrial fibrillation: Secondary | ICD-10-CM | POA: Diagnosis not present

## 2019-07-26 LAB — CBC WITH DIFFERENTIAL/PLATELET
Abs Immature Granulocytes: 0.08 10*3/uL — ABNORMAL HIGH (ref 0.00–0.07)
Basophils Absolute: 0 10*3/uL (ref 0.0–0.1)
Basophils Relative: 0 %
Eosinophils Absolute: 0.1 10*3/uL (ref 0.0–0.5)
Eosinophils Relative: 1 %
HCT: 37.1 % (ref 36.0–46.0)
Hemoglobin: 11.4 g/dL — ABNORMAL LOW (ref 12.0–15.0)
Immature Granulocytes: 1 %
Lymphocytes Relative: 11 %
Lymphs Abs: 1.5 10*3/uL (ref 0.7–4.0)
MCH: 26.2 pg (ref 26.0–34.0)
MCHC: 30.7 g/dL (ref 30.0–36.0)
MCV: 85.3 fL (ref 80.0–100.0)
Monocytes Absolute: 1.2 10*3/uL — ABNORMAL HIGH (ref 0.1–1.0)
Monocytes Relative: 9 %
Neutro Abs: 10.9 10*3/uL — ABNORMAL HIGH (ref 1.7–7.7)
Neutrophils Relative %: 78 %
Platelets: 228 10*3/uL (ref 150–400)
RBC: 4.35 MIL/uL (ref 3.87–5.11)
RDW: 17.4 % — ABNORMAL HIGH (ref 11.5–15.5)
WBC: 13.9 10*3/uL — ABNORMAL HIGH (ref 4.0–10.5)
nRBC: 0 % (ref 0.0–0.2)

## 2019-07-26 LAB — COMPREHENSIVE METABOLIC PANEL
ALT: 14 U/L (ref 0–44)
AST: 19 U/L (ref 15–41)
Albumin: 3.4 g/dL — ABNORMAL LOW (ref 3.5–5.0)
Alkaline Phosphatase: 79 U/L (ref 38–126)
Anion gap: 13 (ref 5–15)
BUN: 82 mg/dL — ABNORMAL HIGH (ref 8–23)
CO2: 33 mmol/L — ABNORMAL HIGH (ref 22–32)
Calcium: 9 mg/dL (ref 8.9–10.3)
Chloride: 90 mmol/L — ABNORMAL LOW (ref 98–111)
Creatinine, Ser: 2.49 mg/dL — ABNORMAL HIGH (ref 0.44–1.00)
GFR calc Af Amer: 23 mL/min — ABNORMAL LOW (ref >60)
GFR calc non Af Amer: 20 mL/min — ABNORMAL LOW (ref >60)
Glucose, Bld: 159 mg/dL — ABNORMAL HIGH (ref 70–99)
Potassium: 3 mmol/L — ABNORMAL LOW (ref 3.5–5.1)
Sodium: 136 mmol/L (ref 135–145)
Total Bilirubin: 0.3 mg/dL (ref 0.3–1.2)
Total Protein: 8.5 g/dL — ABNORMAL HIGH (ref 6.5–8.1)

## 2019-07-26 LAB — FERRITIN: Ferritin: 73 ng/mL (ref 11–307)

## 2019-07-26 LAB — IRON AND TIBC
Iron: 30 ug/dL (ref 28–170)
Saturation Ratios: 11 % (ref 10.4–31.8)
TIBC: 282 ug/dL (ref 250–450)
UIBC: 252 ug/dL

## 2019-07-27 ENCOUNTER — Encounter: Payer: Self-pay | Admitting: Oncology

## 2019-07-27 ENCOUNTER — Other Ambulatory Visit: Payer: Self-pay

## 2019-07-27 ENCOUNTER — Inpatient Hospital Stay (HOSPITAL_BASED_OUTPATIENT_CLINIC_OR_DEPARTMENT_OTHER): Payer: Medicare Other | Admitting: Oncology

## 2019-07-27 ENCOUNTER — Inpatient Hospital Stay: Payer: Medicare Other

## 2019-07-27 VITALS — BP 141/72 | HR 58 | Temp 96.0°F | Resp 20

## 2019-07-27 VITALS — BP 101/70 | HR 73 | Temp 97.7°F | Resp 20 | Wt 305.6 lb

## 2019-07-27 DIAGNOSIS — D631 Anemia in chronic kidney disease: Secondary | ICD-10-CM

## 2019-07-27 DIAGNOSIS — D509 Iron deficiency anemia, unspecified: Secondary | ICD-10-CM

## 2019-07-27 DIAGNOSIS — N183 Chronic kidney disease, stage 3 unspecified: Secondary | ICD-10-CM

## 2019-07-27 DIAGNOSIS — Z7901 Long term (current) use of anticoagulants: Secondary | ICD-10-CM | POA: Diagnosis not present

## 2019-07-27 MED ORDER — IRON SUCROSE 20 MG/ML IV SOLN
200.0000 mg | Freq: Once | INTRAVENOUS | Status: AC
  Administered 2019-07-27: 200 mg via INTRAVENOUS
  Filled 2019-07-27: qty 10

## 2019-07-27 MED ORDER — SODIUM CHLORIDE 0.9 % IV SOLN
Freq: Once | INTRAVENOUS | Status: AC
  Filled 2019-07-27: qty 250

## 2019-07-27 NOTE — Progress Notes (Signed)
Hematology/Oncology Follow up visit [facility] [phone] [phone]   Patient Care Team: Sofie Hartigan, MD as PCP - General (Family Medicine) Alisa Graff, FNP as Nurse Practitioner (Family Medicine) Ubaldo Glassing Javier Docker, MD as Consulting Physician (Cardiology) Elease Etienne, MD as Consulting Physician (Endocrinology)  CHIEF COMPLAINTS/REASON FOR VISIT Follow up for treatment of Anemia    HISTORY OF PRESENTING ILLNESS:  Andrea Rivers 63 y.o.  female with past medical history listed as below who was referred by Dr. Holley Raring to me for evaluation and management of anemia.  Patient was recently hospitalized due to acute respiratory failure secondary to diastolic CHF exacerbation. She was initially admitted to ICU, intubated and required mechanical ventilation. She also had acute on chronic kidney failure. Her recent labs showed microcytic anemia with hemoglobin  6.5, MCV 67.9. She received blood transfusion on 02/24/2017. Hemoglobin improved to 8.6. Patient reports fatigue, and a lack of energy. She has some lower extremity edema. She lives at home by herself and is able to do her ADLs. She takes warfarin for A. Fib. Denies any bleeding events, blood in the stool or black stool.  #  left distal femur comminuted fracture after mechanical fall, s/p ORIF done 11/17/17. Hemoglobin dropped to 7.7, s/p 2 units of PRBC transfusion.  # CT 05/09/2018 showed a possible soft tissue density 4 x 7 x 3.7 cm in the left lower rectum/anus questionable fecal impaction versus mass.  Discussed with patient during last visit about follow-up with Dr. Alice Reichert for further evaluation with a colonoscopy for direct visualization.  Patient agreed at that time but not willing to proceed with additional follow-up at this point.  INTERVAL HISTORY  63 y.o. female presents for follow up for management of chronic anemia. Patient has been on chronic anticoagulation for atrial  fibrillation.  She is on Coumadin. Denies hematochezia, hematuria, hematemesis, epistaxis, black tarry stool or easy bruising.  She reports feeling well today.  No new complaints. Chronic lower extremity edema, close to baseline.  No change.  Review of Systems  Constitutional: Positive for fatigue. Negative for appetite change, chills and fever.  HENT:   Negative for hearing loss, lump/mass and voice change.   Eyes: Negative for eye problems.  Respiratory: Negative for chest tightness, cough and shortness of breath.   Cardiovascular: Positive for leg swelling. Negative for chest pain.  Gastrointestinal: Negative for abdominal distention, abdominal pain, blood in stool, constipation, diarrhea and nausea.  Endocrine: Negative for hot flashes.  Genitourinary: Negative for difficulty urinating, dysuria, frequency and hematuria.   Musculoskeletal: Negative for arthralgias, back pain, gait problem and neck pain.  Skin: Negative for itching and rash.  Neurological: Negative for dizziness, extremity weakness, gait problem and headaches.  Hematological: Negative for adenopathy. Does not bruise/bleed easily.  Psychiatric/Behavioral: Negative for confusion. The patient is not nervous/anxious.    MEDICAL HISTORY:  Past Medical History:  Diagnosis Date  . Anemia   . Anxiety   . CHF (congestive heart failure) ([place])   . Chronic kidney disease    INSUFFICIENCY  . Complication of anesthesia    had to be intubated during cataract surgery   " I could not breath "  . COPD (chronic obstructive pulmonary disease) ([place])   . Diabetes mellitus without complication ([place])   . Dyspnea    DOE  . Dysrhythmia    A FIB  . Edema   . Fracture of distal femur ([place]) 11/2017   left   . GERD (gastroesophageal  reflux disease)   . History of kidney stones   . Hypertension   . Hypothyroidism    ABLATION  . Iron deficiency anemia 03/31/2017  . Neuropathy   . Orthopnea   . Sleep apnea    CPAP  . Vertigo   .  Wheezing     SURGICAL HISTORY: Past Surgical History:  Procedure Laterality Date  . CATARACT EXTRACTION W/PHACO Left 01/26/2017   Procedure: CATARACT EXTRACTION PHACO AND INTRAOCULAR LENS PLACEMENT (IOC);  Surgeon: Estill Cotta, MD;  Location: [facility] ORS;  Service: Ophthalmology;  Laterality: Left;  [place]   1:02.2 AP     23.7 CDE   28.92 fluid pack lot# [id number] H exp.05/08/2018  . CHOLECYSTECTOMY    . ESOPHAGOGASTRODUODENOSCOPY N/A 10/12/2018   Procedure: ESOPHAGOGASTRODUODENOSCOPY (EGD);  Surgeon: Toledo, Benay Pike, MD;  Location: [facility] END[facility];  Service: Gastroenterology;  Laterality: N/A;  . ORIF FEMUR FRACTURE Left 11/17/2017   Procedure: OPEN REDUCTION INTERNAL FIXATION (ORIF) DISTAL FEMUR FRACTURE;  Surgeon: Shona Needles, MD;  Location: [place];  Service: Orthopedics;  Laterality: Left;  . TONSILLECTOMY      SOCIAL HISTORY: Social History   Socioeconomic History  . Marital status: Married    Spouse name: Not on file  . Number of children: Not on file  . Years of education: Not on file  . Highest education level: Not on file  Occupational History  . Occupation: retired  Tobacco Use  . Smoking status: Former Smoker    Packs/day: 3.00    Types: Cigarettes    Quit date: 1988    Years since quitting: 32.9  . Smokeless tobacco: Never Used  Substance and Sexual Activity  . Alcohol use: No  . Drug use: No  . Sexual activity: Not Currently  Other Topics Concern  . Not on file  Social History Narrative  . Not on file   Social Determinants of Health   Financial Resource Strain: Low Risk   . Difficulty of Paying Living Expenses: Not hard at all  Food Insecurity: No Food Insecurity  . Worried About Charity fundraiser in the Last Year: Never true  . Ran Out of Food in the Last Year: Never true  Transportation Needs: No Transportation Needs  . Lack of Transportation (Medical): No  . Lack of Transportation (Non-Medical): No  Physical Activity: Sufficiently Active  . Days  of Exercise per Week: 7 days  . Minutes of Exercise per Session: 30 min  Stress: No Stress Concern Present  . Feeling of Stress : Only a little  Social Connections: Moderately Isolated  . Frequency of Communication with Friends and Family: Never  . Frequency of Social Gatherings with Friends and Family: Never  . Attends Religious Services: 1 to 4 times per year  . Active Member of Clubs or Organizations: No  . Attends Archivist Meetings: Never  . Marital Status: Never married  Intimate Partner Violence: Not At Risk  . Fear of Current or Ex-Partner: No  . Emotionally Abused: No  . Physically Abused: No  . Sexually Abused: No    FAMILY HISTORY: Family History  Problem Relation Age of Onset  . COPD Mother   . Heart disease Mother   . Anemia Mother   . Heart disease Father   . COPD Father   . Anemia Sister   . Diabetes Maternal Grandmother   . Hypertension Paternal Grandfather     ALLERGIES:  is allergic to other; latex; exenatide; garlic; onion; rosiglitazone; and erythromycin.  MEDICATIONS:  Current Outpatient Medications  Medication Sig Dispense Refill  . acetaminophen (TYLENOL) 500 MG tablet Take 1,000 mg by mouth 2 (two) times daily as needed for mild pain, fever or headache.     . albuterol (PROAIR HFA) 108 (90 Base) MCG/ACT inhaler Inhale 2 puffs into the lungs every 4 (four) hours as needed for wheezing or shortness of breath.     Marland Kitchen amiodarone (PACERONE) 200 MG tablet Take 200 mg by mouth daily.  0  . Biotin 10 MG CAPS Take 1 capsule by mouth daily.     . Calcium Carbonate-Vitamin D3 (CALCIUM 600-D) 600-400 MG-UNIT TABS Take 1 tablet by mouth 3 (three) times daily with meals.     . cetirizine (ZYRTEC) 10 MG tablet Take 10 mg by mouth daily.    . Cholecalciferol (D3 HIGH POTENCY) 125 MCG (5000 UT) capsule Take 5,000 Units by mouth daily.     . citalopram (CELEXA) 40 MG tablet Take 40 mg by mouth daily.     . clonazePAM (KLONOPIN) 1 MG tablet Take 1 tablet by  mouth 2 (two) times daily as needed for anxiety.     . cloNIDine (CATAPRES) 0.1 MG tablet Take 1 tablet (0.1 mg total) by mouth daily. (Patient taking differently: Take 0.1 mg by mouth 2 (two) times daily. ) 60 tablet 5  . Co-Enzyme Q-10 100 MG CAPS Take 100 mg by mouth daily.     . cyclobenzaprine (FLEXERIL) 5 MG tablet Take 5 mg by mouth 3 (three) times daily as needed for muscle spasms.    Marland Kitchen docusate sodium (COLACE) 50 MG capsule Take 100-200 mg by mouth 2 (two) times daily. 200mg  in the morning and 100mg  in the evening    . fluticasone (FLONASE) 50 MCG/ACT nasal spray SHAKE LQ AND U 1 SPR IEN QD    . Fluticasone-Salmeterol (WIXELA INHUB) 250-50 MCG/DOSE AEPB Inhale 1 puff into the lungs 2 (two) times daily.    Marland Kitchen gabapentin (NEURONTIN) 100 MG capsule Take 100 mg by mouth 3 (three) times daily.    . Garlic 123XX123 MG CAPS Take 1,000 mg by mouth daily.     Marland Kitchen glimepiride (AMARYL) 2 MG tablet Take 2 mg by mouth daily with breakfast.    . HYDROcodone-acetaminophen (NORCO) 10-325 MG tablet Take 1 tablet by mouth every 6 (six) hours as needed for severe pain.     . Insulin Detemir (LEVEMIR FLEXTOUCH) 100 UNIT/ML Pen Inject 22 Units into the skin at bedtime.     . insulin lispro (HUMALOG) 100 UNIT/ML injection Inject 15-20 Units into the skin 3 (three) times daily before meals.     Marland Kitchen levothyroxine (SYNTHROID, LEVOTHROID) 175 MCG tablet Take 175 mcg by mouth every evening.     . linagliptin (TRADJENTA) 5 MG TABS tablet take 1 tablet by mouth once daily    . lovastatin (MEVACOR) 20 MG tablet Take 20 mg by mouth at bedtime.    . magnesium oxide (MAG-OX) 400 MG tablet Take 400 mg by mouth 2 (two) times daily.    . metolazone (ZAROXOLYN) 5 MG tablet Take 5 mg by mouth as needed (for a weight gain of 2 pounds overnight or 5 pounds in a week; MAX OF 5 TABLETS AT A TIME).     . Multiple Vitamins-Minerals (CENTRUM WOMEN) TABS Take 1 tablet by mouth daily.     . Omega-3 Fatty Acids (FISH OIL) 1000 MG CPDR Take 1  capsule by mouth daily.    . pantoprazole (PROTONIX) 40 MG tablet Take  1 tablet (40 mg total) by mouth 2 (two) times daily before a meal. 60 tablet 1  . polyethylene glycol (MIRALAX) 17 g packet Take 17 g by mouth daily. 14 each 0  . potassium chloride SA (K-DUR) 20 MEQ tablet Take 1 tablet (20 mEq total) by mouth 2 (two) times daily. Take 20 mEq twice daily and double dose when taking Metolazole 200 tablet 3  . senna (SENOKOT) 8.6 MG TABS tablet Take 8.6-17.2 mg by mouth 2 (two) times daily. 17.2mg  (2 tablets) in the morning and 8.6mg  in the evening    . torsemide (DEMADEX) 20 MG tablet TAKE 2 TABLETS BY MOUTH TWICE DAILY 360 tablet 3  . vitamin C (ASCORBIC ACID) 500 MG tablet Take 500 mg by mouth daily.    Marland Kitchen warfarin (COUMADIN) 5 MG tablet Take 5 mg by mouth daily at 6 PM.   0  . losartan (COZAAR) 25 MG tablet Take 1 tablet (25 mg total) by mouth daily. 90 tablet 3   No current facility-administered medications for this visit.      Marland Kitchen  PHYSICAL EXAMINATION: ECOG PERFORMANCE STATUS: 2 - Symptomatic, <50% confined to bed Vitals:   07/27/19 1307  BP: 101/70  Pulse: 73  Resp: 20  Temp: 97.7 F (36.5 C)   Filed Weights   07/27/19 1307  Weight: (!) 305 lb 9.6 oz (138.6 kg)   Physical Exam  Constitutional: She is oriented to person, place, and time. No distress.  Walks with a walker, morbidly obese.   HENT:  Head: Normocephalic and atraumatic.  Mouth/Throat: No oropharyngeal exudate.  Eyes: Pupils are equal, round, and reactive to light. Conjunctivae and EOM are normal. Right eye exhibits no discharge. Left eye exhibits no discharge. No scleral icterus.  Neck: No JVD present.  Cardiovascular: Normal rate.  Pulmonary/Chest: Effort normal and breath sounds normal. She has no wheezes.  Abdominal: Soft. Bowel sounds are normal. She exhibits no distension. There is no abdominal tenderness.  Musculoskeletal:        General: Edema present. Normal range of motion.     Cervical back:  Normal range of motion and neck supple.     Comments: Chronic 1+ edema,    Lymphadenopathy:    She has no cervical adenopathy.  Neurological: She is alert and oriented to person, place, and time.  Skin: Skin is warm and dry. She is not diaphoretic. No erythema.  Bilateral LE vein insufficiency skin changes.  Scattered bruises on bilateral upper extremities  Psychiatric: Mood normal.     LABORATORY DATA:  I have reviewed the data as listed CBC Latest Ref Rng & Units 07/26/2019 03/23/2019 12/20/2018  WBC 4.0 - 10.5 K/uL 13.9(H) 10.8(H) 11.7(H)  Hemoglobin 12.0 - 15.0 g/dL 11.4(L) 12.0 10.7(L)  Hematocrit 36.0 - 46.0 % 37.1 39.2 36.3  Platelets 150 - 400 K/uL 228 214 281   Recent Labs    12/20/18 1100 03/23/19 0942 07/26/19 1535  NA 139 139 136  K 3.7 3.1* 3.0*  CL 91* 91* 90*  CO2 33* 34* 33*  GLUCOSE 100* 78 159*  BUN 39* 80* 82*  CREATININE 1.83* 2.92* 2.49*  CALCIUM 9.4 9.8 9.0  GFRNONAA 29* 16* 20*  GFRAA 33* 19* 23*  PROT 8.4* 8.4* 8.5*  ALBUMIN 3.5 3.9 3.4*  AST 23 22 19   ALT 12 15 14   ALKPHOS 83 76 79  BILITOT 0.7 0.5 0.3     Labs obtained at the nephrologist office showed Peripheral blood Protein electrophoresis revealed no monoclonal  spike. Urine protein electrophoresis negative for monoclonal spike. Hemoglobin 7.9, WBC 12.1, MCV 72, RDW 19, platelet 283,000, neutrophil 9.2, lymphocyte 1.6, monocyte 1.0, a ANA negative.  ASSESSMENT & PLAN:  1. Iron deficiency anemia, unspecified iron deficiency anemia type   2. Chronic anticoagulation   3. Anemia of chronic kidney failure, stage 3 (moderate)    Labs are reviewed and discussed with patient. Hemoglobin remained stable.  11.4 today. Iron panel shows ferritin 73, iron saturation 11, TIBC 281.  Consistent with anemia due to chronic disease. In the context of chronic kidney disease, would like to further increase her ferritin level. Recommend IV Venofer 200 mg x 1 today. Her hemoglobin has been above 10.  No  need for erythropoietin replacement treatment.  . Orders Placed This Encounter  Procedures  . CBC with Differential    Standing Status:   Future    Standing Expiration Date:   07/26/2020  . Ferritin    Standing Status:   Future    Standing Expiration Date:   07/26/2020  . Iron and TIBC    Standing Status:   Future    Standing Expiration Date:   07/26/2020    Return of visit: 4 months.  with CBC, TIBC, ferritin done one or 2 days prior to, possible Venofer.    Earlie Server, MD, PhD Hematology Oncology [facility] Pager- [phone] 07/27/2019

## 2019-07-27 NOTE — Progress Notes (Signed)
Patient does not offer any problems today.  

## 2019-10-11 ENCOUNTER — Ambulatory Visit: Payer: Medicare Other | Attending: Internal Medicine

## 2019-10-22 ENCOUNTER — Emergency Department
Admission: EM | Admit: 2019-10-22 | Discharge: 2019-10-22 | Disposition: A | Discharge status: Home / Self Care | Payer: Medicare Other | Attending: Emergency Medicine | Admitting: Emergency Medicine

## 2019-10-22 ENCOUNTER — Emergency Department: Payer: Medicare Other

## 2019-10-22 ENCOUNTER — Encounter: Payer: Self-pay | Admitting: Emergency Medicine

## 2019-10-22 ENCOUNTER — Other Ambulatory Visit: Payer: Self-pay

## 2019-10-22 DIAGNOSIS — Z87891 Personal history of nicotine dependence: Secondary | ICD-10-CM | POA: Insufficient documentation

## 2019-10-22 DIAGNOSIS — N189 Chronic kidney disease, unspecified: Secondary | ICD-10-CM | POA: Insufficient documentation

## 2019-10-22 DIAGNOSIS — Z79899 Other long term (current) drug therapy: Secondary | ICD-10-CM | POA: Diagnosis not present

## 2019-10-22 DIAGNOSIS — I130 Hypertensive heart and chronic kidney disease with heart failure and stage 1 through stage 4 chronic kidney disease, or unspecified chronic kidney disease: Secondary | ICD-10-CM | POA: Insufficient documentation

## 2019-10-22 DIAGNOSIS — Z9049 Acquired absence of other specified parts of digestive tract: Secondary | ICD-10-CM | POA: Diagnosis not present

## 2019-10-22 DIAGNOSIS — J449 Chronic obstructive pulmonary disease, unspecified: Secondary | ICD-10-CM | POA: Insufficient documentation

## 2019-10-22 DIAGNOSIS — Z7901 Long term (current) use of anticoagulants: Secondary | ICD-10-CM | POA: Insufficient documentation

## 2019-10-22 DIAGNOSIS — R2231 Localized swelling, mass and lump, right upper limb: Secondary | ICD-10-CM | POA: Diagnosis present

## 2019-10-22 DIAGNOSIS — E039 Hypothyroidism, unspecified: Secondary | ICD-10-CM | POA: Insufficient documentation

## 2019-10-22 DIAGNOSIS — Z91040 Latex allergy status: Secondary | ICD-10-CM | POA: Diagnosis not present

## 2019-10-22 DIAGNOSIS — Z794 Long term (current) use of insulin: Secondary | ICD-10-CM | POA: Diagnosis not present

## 2019-10-22 DIAGNOSIS — I5032 Chronic diastolic (congestive) heart failure: Secondary | ICD-10-CM | POA: Insufficient documentation

## 2019-10-22 DIAGNOSIS — E1122 Type 2 diabetes mellitus with diabetic chronic kidney disease: Secondary | ICD-10-CM | POA: Diagnosis not present

## 2019-10-22 DIAGNOSIS — L03011 Cellulitis of right finger: Secondary | ICD-10-CM | POA: Insufficient documentation

## 2019-10-22 MED ORDER — CEPHALEXIN 500 MG PO CAPS: 500.0000 mg | ORAL_CAPSULE | Freq: Three times a day (TID) | ORAL | 0 refills | Status: DC

## 2019-10-22 MED ORDER — TRAMADOL HCL 50 MG PO TABS: 50.0000 mg | ORAL_TABLET | Freq: Four times a day (QID) | ORAL | 0 refills | Status: AC | PRN

## 2019-10-22 MED ORDER — PREDNISONE 10 MG PO TABS: ORAL_TABLET | ORAL | 0 refills | Status: DC

## 2019-10-22 NOTE — ED Triage Notes (Signed)
Presents with pain and swelling toright middle finger  Denies any injury

## 2019-10-22 NOTE — ED Provider Notes (Signed)
[facility] Emergency Department Provider Note   ____________________________________________   First MD Initiated Contact with Patient 10/22/19 [phone]     (approximate)  I have reviewed the triage vital signs and the nursing notes.   HISTORY  Chief Complaint No chief complaint on file.   HPI Andrea Rivers is a 64 y.o. female presents to the ED with complaint of pain and swelling to her right third finger.  Patient states that several days ago she reached underneath her car seat and had a skin tear to the dorsal aspect of her right hand which she cleaned and put Band-Aids and Steri-Strips on.  She has noticed recently that her third finger was getting red and swollen.  She states that she does not recall having a puncture wound to her hand when she was reaching under the car seat.  Patient states that her tetanus is up-to-date.  She rates her pain as a 10/10.       Past Medical History:  Diagnosis Date  . Anemia   . Anxiety   . CHF (congestive heart failure) ([place])   . Chronic kidney disease    INSUFFICIENCY  . Complication of anesthesia    had to be intubated during cataract surgery   " I could not breath "  . COPD (chronic obstructive pulmonary disease) ([place])   . Diabetes mellitus without complication ([place])   . Dyspnea    DOE  . Dysrhythmia    A FIB  . Edema   . Fracture of distal femur ([place]) 11/2017   left   . GERD (gastroesophageal reflux disease)   . History of kidney stones   . Hypertension   . Hypothyroidism    ABLATION  . Iron deficiency anemia 03/31/2017  . Neuropathy   . Orthopnea   . Sleep apnea    CPAP  . Vertigo   . Wheezing     Patient Active Problem List   Diagnosis Date Noted  . CHF (congestive heart failure) ([place]) 10/15/2018  . Acute on chronic diastolic CHF (congestive heart failure) ([place]) 10/13/2018  . Hypokalemia 06/08/2018  . Lymphedema 06/08/2018  . Closed displaced supracondylar fracture of distal end of left  femur with intracondylar extension ([place]) 11/17/2017  . CKD (chronic kidney disease) 11/14/2017  . Hypothyroidism 11/14/2017  . Atrial fibrillation ([place]) 11/14/2017  . Anxiety state 11/14/2017  . Peripheral neuropathy 11/14/2017  . Bradycardia 08/13/2017  . Microcytic anemia 03/31/2017  . Iron deficiency anemia 03/31/2017  . Chronic diastolic heart failure ([place]) 03/10/2017  . HTN (hypertension) 03/10/2017  . Diabetes ([place]) 03/10/2017  . Obstructive sleep apnea 03/10/2017    Past Surgical History:  Procedure Laterality Date  . CATARACT EXTRACTION W/PHACO Left 01/26/2017   Procedure: CATARACT EXTRACTION PHACO AND INTRAOCULAR LENS PLACEMENT (IOC);  Surgeon: Estill Cotta, MD;  Location: [facility] ORS;  Service: Ophthalmology;  Laterality: Left;  [place]   1:02.2 AP     23.7 CDE   28.92 fluid pack lot# [id number] H exp.05/08/2018  . CHOLECYSTECTOMY    . ESOPHAGOGASTRODUODENOSCOPY N/A 10/12/2018   Procedure: ESOPHAGOGASTRODUODENOSCOPY (EGD);  Surgeon: Toledo, Benay Pike, MD;  Location: [facility] ENDOSCOPY;  Service: Gastroenterology;  Laterality: N/A;  . ORIF FEMUR FRACTURE Left 11/17/2017   Procedure: OPEN REDUCTION INTERNAL FIXATION (ORIF) DISTAL FEMUR FRACTURE;  Surgeon: Shona Needles, MD;  Location: [place];  Service: Orthopedics;  Laterality: Left;  . TONSILLECTOMY      Prior to Admission medications   Medication Sig Start Date End Date Taking?  Authorizing Provider  acetaminophen (TYLENOL) 500 MG tablet Take 1,000 mg by mouth 2 (two) times daily as needed for mild pain, fever or headache.     [provider]  albuterol (PROAIR HFA) 108 (90 Base) MCG/ACT inhaler Inhale 2 puffs into the lungs every 4 (four) hours as needed for wheezing or shortness of breath.  03/14/17   [provider]  amiodarone (PACERONE) 200 MG tablet Take 200 mg by mouth daily. 05/10/17   [provider]  Biotin 10 MG CAPS Take 1 capsule by mouth daily.     [provider]  Calcium  Carbonate-Vitamin D3 (CALCIUM 600-D) 600-400 MG-UNIT TABS Take 1 tablet by mouth 3 (three) times daily with meals.     [provider]  cephALEXin (KEFLEX) 500 MG capsule Take 1 capsule (500 mg total) by mouth 3 (three) times daily. 10/22/19   Johnn Hai, PA-C  cetirizine (ZYRTEC) 10 MG tablet Take 10 mg by mouth daily.    [provider]  Cholecalciferol (D3 HIGH POTENCY) 125 MCG (5000 UT) capsule Take 5,000 Units by mouth daily.     [provider]  citalopram (CELEXA) 40 MG tablet Take 40 mg by mouth daily.     [provider]  clonazePAM (KLONOPIN) 1 MG tablet Take 1 tablet by mouth 2 (two) times daily as needed for anxiety.  05/17/18   [provider]  cloNIDine (CATAPRES) 0.1 MG tablet Take 1 tablet (0.1 mg total) by mouth daily. Patient taking differently: Take 0.1 mg by mouth 2 (two) times daily.  02/06/19   Darylene Price A, FNP  Co-Enzyme Q-10 100 MG CAPS Take 100 mg by mouth daily.     [provider]  cyclobenzaprine (FLEXERIL) 5 MG tablet Take 5 mg by mouth 3 (three) times daily as needed for muscle spasms.    [provider]  docusate sodium (COLACE) 50 MG capsule Take 100-200 mg by mouth 2 (two) times daily. 200mg  in the morning and 100mg  in the evening    [provider]  fluticasone (FLONASE) 50 MCG/ACT nasal spray SHAKE LQ AND U 1 SPR IEN QD 12/04/18   [provider]  Fluticasone-Salmeterol (WIXELA INHUB) 250-50 MCG/DOSE AEPB Inhale 1 puff into the lungs 2 (two) times daily.    [provider]  gabapentin (NEURONTIN) 100 MG capsule Take 100 mg by mouth 3 (three) times daily.    [provider]  Garlic 8338 MG CAPS Take 1,000 mg by mouth daily.     [provider]  glimepiride (AMARYL) 2 MG tablet Take 2 mg by mouth daily with breakfast.    [provider]  HYDROcodone-acetaminophen (NORCO) 10-325 MG tablet Take 1 tablet by mouth every 6 (six) hours as needed for  severe pain.     [provider]  Insulin Detemir (LEVEMIR FLEXTOUCH) 100 UNIT/ML Pen Inject 22 Units into the skin at bedtime.  03/01/18   [provider]  insulin lispro (HUMALOG) 100 UNIT/ML injection Inject 15-20 Units into the skin 3 (three) times daily before meals.     [provider]  levothyroxine (SYNTHROID, LEVOTHROID) 175 MCG tablet Take 175 mcg by mouth every evening.     [provider]  linagliptin (TRADJENTA) 5 MG TABS tablet take 1 tablet by mouth once daily 01/03/18   [provider]  losartan (COZAAR) 25 MG tablet Take 1 tablet (25 mg total) by mouth daily. 02/06/19 05/30/19  Alisa Graff, FNP  lovastatin (MEVACOR) 20 MG  tablet Take 20 mg by mouth at bedtime.    [provider]  magnesium oxide (MAG-OX) 400 MG tablet Take 400 mg by mouth 2 (two) times daily.    [provider]  metolazone (ZAROXOLYN) 5 MG tablet Take 5 mg by mouth as needed (for a weight gain of 2 pounds overnight or 5 pounds in a week; MAX OF 5 TABLETS AT A TIME).  04/18/17   [provider]  Multiple Vitamins-Minerals (CENTRUM WOMEN) TABS Take 1 tablet by mouth daily.     [provider]  Omega-3 Fatty Acids (FISH OIL) 1000 MG CPDR Take 1 capsule by mouth daily.    [provider]  pantoprazole (PROTONIX) 40 MG tablet Take 1 tablet (40 mg total) by mouth 2 (two) times daily before a meal. 10/12/18   Fritzi Mandes, MD  polyethylene glycol (MIRALAX) 17 g packet Take 17 g by mouth daily. 06/23/19   Lannie Fields, PA-C  potassium chloride SA (K-DUR) 20 MEQ tablet Take 1 tablet (20 mEq total) by mouth 2 (two) times daily. Take 20 mEq twice daily and double dose when taking Metolazole 03/22/19   Darylene Price A, FNP  predniSONE (DELTASONE) 10 MG tablet Take 2 tablets once a day for 5 days 10/22/19   Johnn Hai, PA-C  senna (SENOKOT) 8.6 MG TABS tablet Take 8.6-17.2 mg by mouth 2 (two) times daily. 17.2mg  (2 tablets) in the  morning and 8.6mg  in the evening    [provider]  torsemide (DEMADEX) 20 MG tablet TAKE 2 TABLETS BY MOUTH TWICE DAILY 06/10/19   Alisa Graff, FNP  traMADol (ULTRAM) 50 MG tablet Take 1 tablet (50 mg total) by mouth every 6 (six) hours as needed for moderate pain. 10/22/19   Johnn Hai, PA-C  vitamin C (ASCORBIC ACID) 500 MG tablet Take 500 mg by mouth daily.    [provider]  warfarin (COUMADIN) 5 MG tablet Take 5 mg by mouth daily at 6 PM.  04/22/17   [provider]    Allergies Other, Latex, Exenatide, Garlic, Onion, Rosiglitazone, and Erythromycin  Family History  Problem Relation Age of Onset  . COPD Mother   . Heart disease Mother   . Anemia Mother   . Heart disease Father   . COPD Father   . Anemia Sister   . Diabetes Maternal Grandmother   . Hypertension Paternal Grandfather     Social History Social History   Tobacco Use  . Smoking status: Former Smoker    Packs/day: 3.00    Types: Cigarettes    Quit date: 1988    Years since quitting: 33.2  . Smokeless tobacco: Never Used  Substance Use Topics  . Alcohol use: No  . Drug use: No    Review of Systems Constitutional: No fever/chills Eyes: No visual changes. ENT: No sore throat. Cardiovascular: Denies chest pain. Respiratory: Denies shortness of breath. Gastrointestinal:  No nausea, no vomiting.   Musculoskeletal: Right third digit is painful and swollen. Skin: Resolving skin tear right hand.  Erythematous right third digit. Neurological: Negative for headaches, focal weakness or numbness. ____________________________________________   PHYSICAL EXAM:  VITAL SIGNS: ED Triage Vitals  Enc Vitals Group     BP 10/22/19 0716 (!) 131/41     Pulse Rate 10/22/19 0716 (!) 53     Resp 10/22/19 0716 18     Temp 10/22/19 0716 97.6 F (36.4 C)     Temp Source 10/22/19 0716 Oral  SpO2 10/22/19 0716 99 %     Weight 10/22/19 0715 293 lb (132.9 kg)     Height 10/22/19 0715  5\' 5"  (1.651 m)     Head Circumference --      Peak Flow --      Pain Score 10/22/19 0715 10     Pain Loc --      Pain Edu? --      Excl. in [place]? --     Constitutional: Alert and oriented. Well appearing and in no acute distress. Eyes: Conjunctivae are normal.  Head: Atraumatic. Neck: No stridor.   Cardiovascular: Normal rate, regular rhythm. Grossly normal heart sounds.  Good peripheral circulation. Respiratory: Normal respiratory effort.  No retractions. Lungs CTAB. Musculoskeletal: On examination of the right hand there is no gross deformity however there is moderate soft tissue edema noted to the right third digit from the PIP joint to the base of the finger.  There is moderate tenderness on palpation of the PIP joint and patient has decreased range of motion.  Moderate warmth and erythema is noted to the proximal phalanx.  Skin is intact and there is no evidence of abscess or purulent drainage.  Motor sensory function intact and capillary refills less than 3 seconds. Neurologic:  Normal speech and language. No gross focal neurologic deficits are appreciated. No gait instability. Skin:  Skin is warm, dry. Psychiatric: Mood and affect are normal. Speech and behavior are normal.  ____________________________________________   LABS (all labs ordered are listed, but only abnormal results are displayed)  Labs Reviewed - No data to display  RADIOLOGY  Official radiology report(s): DG Hand Complete Right  Result Date: 10/22/2019 CLINICAL DATA:  Pain and swelling EXAM: RIGHT HAND - COMPLETE 3+ VIEW COMPARISON:  None. FINDINGS: There is no evidence of fracture or dislocation. Joint spaces are maintained. No erosion or periostitis. Diffuse soft tissue swelling of the right long finger. No soft tissue gas or radiopaque foreign body. Prominent vascular calcifications. IMPRESSION: Diffuse soft tissue swelling of the right long finger. No acute osseous abnormality. Electronically Signed   By:  Davina Poke D.O.   On: 10/22/2019 08:55    ____________________________________________   PROCEDURES  Procedure(s) performed (including Critical Care):  Procedures   ____________________________________________   INITIAL IMPRESSION / ASSESSMENT AND PLAN / ED COURSE  As part of my medical decision making, I reviewed the following data within the electronic MEDICAL RECORD NUMBER Notes from prior ED visits and [place] Controlled Substance Database   64 year old female presents to the ED with complaint of swelling to her right middle finger without known history of injury.  Patient states that she reached under her car seat to get an item and had a skin tear to her dorsal aspect of her hand.  She is noticed in the last 24 hours that she has developed some redness to her third digit.  Area appears to have cellulitis no open wound was noted.  I discussed medication with patient who is agreeable.  Patient is uneasy as she has 20 minutes to get out of the ED in order to take her husband to [facility] to be seen by his doctor.  Patient agrees to antibiotics, warm compresses, tramadol if needed for pain and a low dose of prednisone if she is on blood thinners and cannot take NSAIDs.  She is to follow-up with her PCP or return to the emergency department if there is any severe worsening of her cellulitis. ____________________________________________   FINAL CLINICAL IMPRESSION(S) /  ED DIAGNOSES  Final diagnoses:  Cellulitis of middle finger, right     ED Discharge Orders         Ordered    traMADol (ULTRAM) 50 MG tablet  Every 6 hours PRN     10/22/19 0942    cephALEXin (KEFLEX) 500 MG capsule  3 times daily     10/22/19 0942    predniSONE (DELTASONE) 10 MG tablet     10/22/19 6073           Note:  This document was prepared using Dragon voice recognition software and may include unintentional dictation errors.    Johnn Hai, PA-C 10/22/19 1416    Earleen Newport,  MD 10/22/19 1434

## 2019-11-23 ENCOUNTER — Inpatient Hospital Stay: Payer: Medicare Other | Attending: Oncology

## 2019-11-23 DIAGNOSIS — D631 Anemia in chronic kidney disease: Secondary | ICD-10-CM | POA: Insufficient documentation

## 2019-11-23 DIAGNOSIS — N183 Chronic kidney disease, stage 3 unspecified: Secondary | ICD-10-CM | POA: Insufficient documentation

## 2019-11-26 ENCOUNTER — Telehealth: Payer: Self-pay | Admitting: Oncology

## 2019-11-26 ENCOUNTER — Inpatient Hospital Stay: Payer: Medicare Other

## 2019-11-26 ENCOUNTER — Inpatient Hospital Stay: Payer: Medicare Other | Admitting: Oncology

## 2019-11-26 NOTE — Telephone Encounter (Signed)
Patient missed appt today. Writer phoned patient and patient stated that patient was unaware that she had an appt. Appts rescheduled for 11-28-19.

## 2019-11-27 NOTE — Progress Notes (Signed)
Patient ID: Andrea Rivers, female    DOB: Oct 16, 1955, 65 y.o.   MRN: [id number]  HPI  Andrea Rivers is a 64 y/o female with a history of anemia, anxiety, CKD, COPD, DM, GERD, HTN, hypothyroidism, neuropathy, obstructive sleep apnea (+CPAP), previous tobacco use and chronic heart failure.  Echo report from 10/13/2018 reviewed and showed an EF of 55-60%. Echo report from 02/23/17 reviewed and showed an EF of 55-60% along with mild MR. Echo done 01/26/17 shows an EF of 50-55%.   Was in the ED 10/22/19 due to cellulitis of right middle finger where she was treated and released.   She presents today for a follow-up visit with a chief complaint of moderate fatigue upon minimal exertion. She describes this as chronic in nature having been present for several years although does feel like it's worse since the recent death of her husband. She has associated shortness of breath, pedal edema (improving), dizziness and depression along with this. She denies any difficulty sleeping, abdominal distention, palpitations, chest pain, cough or weight gain.   Says that she has good support and is trying to take care of herself. She says that she is using the Wixela inhaler as needed because she doesn't like the taste and would prefer to go back on her "regular advair".   Past Medical History:  Diagnosis Date  . Anemia   . Anxiety   . CHF (congestive heart failure) ([place])   . Chronic kidney disease    INSUFFICIENCY  . Complication of anesthesia    had to be intubated during cataract surgery   " I could not breath "  . COPD (chronic obstructive pulmonary disease) ([place])   . Diabetes mellitus without complication ([place])   . Dyspnea    DOE  . Dysrhythmia    A FIB  . Edema   . Fracture of distal femur ([place]) 11/2017   left   . GERD (gastroesophageal reflux disease)   . History of kidney stones   . Hypertension   . Hypothyroidism    ABLATION  . Iron deficiency anemia 03/31/2017  . Neuropathy   . Orthopnea   .  Sleep apnea    CPAP  . Vertigo   . Wheezing    Past Surgical History:  Procedure Laterality Date  . CATARACT EXTRACTION W/PHACO Left 01/26/2017   Procedure: CATARACT EXTRACTION PHACO AND INTRAOCULAR LENS PLACEMENT (IOC);  Surgeon: Estill Cotta, MD;  Location: [facility] ORS;  Service: Ophthalmology;  Laterality: Left;  [place]   1:02.2 AP     23.7 CDE   28.92 fluid pack lot# [id number] H exp.05/08/2018  . CHOLECYSTECTOMY    . ESOPHAGOGASTRODUODENOSCOPY N/A 10/12/2018   Procedure: ESOPHAGOGASTRODUODENOSCOPY (EGD);  Surgeon: Toledo, Benay Pike, MD;  Location: [facility] END[facility];  Service: Gastroenterology;  Laterality: N/A;  . ORIF FEMUR FRACTURE Left 11/17/2017   Procedure: OPEN REDUCTION INTERNAL FIXATION (ORIF) DISTAL FEMUR FRACTURE;  Surgeon: Shona Needles, MD;  Location: [place];  Service: Orthopedics;  Laterality: Left;  . TONSILLECTOMY     Family History  Problem Relation Age of Onset  . COPD Mother   . Heart disease Mother   . Anemia Mother   . Heart disease Father   . COPD Father   . Anemia Sister   . Diabetes Maternal Grandmother   . Hypertension Paternal Grandfather    Social History   Tobacco Use  . Smoking status: Former Smoker    Packs/day: 3.00    Types: Cigarettes    Quit date: 1988  Years since quitting: 33.3  . Smokeless tobacco: Never Used  Substance Use Topics  . Alcohol use: No   Allergies  Allergen Reactions  . Other Other (See Comments)    ILOSONE- Caused GI distress also  . Latex Hives  . Exenatide Nausea Only and Other (See Comments)    [place]- Nausea and abdominal pain, also  . Garlic Other (See Comments)    Severe acid reflux  . Onion Other (See Comments)    Severe acid reflux  . Rosiglitazone Other (See Comments)    [place]- Affected heart  . Erythromycin Nausea And Vomiting    GI DISTRESS   Prior to Admission medications   Medication Sig Start Date End Date Taking? Authorizing Provider  acetaminophen (TYLENOL) 500 MG tablet Take 1,000 mg by mouth  2 (two) times daily as needed for mild pain, fever or headache.    Yes [provider]  albuterol (PROAIR HFA) 108 (90 Base) MCG/ACT inhaler Inhale 2 puffs into the lungs every 4 (four) hours as needed for wheezing or shortness of breath.  03/14/17  Yes [provider]  amiodarone (PACERONE) 200 MG tablet Take 200 mg by mouth daily. 05/10/17  Yes [provider]  Biotin 10 MG CAPS Take 1 capsule by mouth daily.    Yes [provider]  Calcium Carbonate-Vitamin D3 (CALCIUM 600-D) 600-400 MG-UNIT TABS Take 1 tablet by mouth 3 (three) times daily with meals. Patient reports taking twice daily   Yes [provider]  cetirizine (ZYRTEC) 10 MG tablet Take 10 mg by mouth daily.   Yes [provider]  Cholecalciferol (D3 HIGH POTENCY) 125 MCG (5000 UT) capsule Take 5,000 Units by mouth daily.    Yes [provider]  citalopram (CELEXA) 40 MG tablet Take 40 mg by mouth daily.    Yes [provider]  clonazePAM (KLONOPIN) 1 MG tablet Take 1 tablet by mouth 2 (two) times daily as needed for anxiety.  05/17/18  Yes [provider]  cloNIDine (CATAPRES) 0.1 MG tablet Take 1 tablet (0.1 mg total) by mouth daily. 02/06/19  Yes Amorette Charrette A, FNP  Co-Enzyme Q-10 100 MG CAPS Take 100 mg by mouth daily.    Yes [provider]  cyclobenzaprine (FLEXERIL) 5 MG tablet Take 5 mg by mouth 3 (three) times daily as needed for muscle spasms.   Yes [provider]  docusate sodium (COLACE) 50 MG capsule Take 100-200 mg by mouth 2 (two) times daily. 200mg  in the morning and 100mg  in the evening   Yes [provider]  fluticasone (FLONASE) 50 MCG/ACT nasal spray SHAKE LQ AND U 1 SPR IEN QD 12/04/18  Yes [provider]  Fluticasone-Salmeterol (WIXELA INHUB) 250-50 MCG/DOSE AEPB Inhale 1 puff into the lungs 2 (two) times daily. Patient reports taking only PRN   Yes [provider]  gabapentin (NEURONTIN) 100 MG  capsule Take 100 mg by mouth 3 (three) times daily.   Yes [provider]  Garlic 4268 MG CAPS Take 1,000 mg by mouth daily.    Yes [provider]  glimepiride (AMARYL) 2 MG tablet Take 2 mg by mouth daily with breakfast.   Yes [provider]  Insulin Detemir (LEVEMIR FLEXTOUCH) 100 UNIT/ML Pen Inject 23 Units into the skin at bedtime.  03/01/18  Yes [provider]  insulin lispro (HUMALOG) 100 UNIT/ML injection Inject 15-20 Units into the skin 3 (three) times daily before meals. Up to 80units a day   Yes  [provider]  levothyroxine (SYNTHROID, LEVOTHROID) 175 MCG tablet Take 175 mcg by mouth every evening.    Yes [provider]  linagliptin (TRADJENTA) 5 MG TABS tablet take 1 tablet by mouth once daily 01/03/18  Yes [provider]  losartan (COZAAR) 25 MG tablet Take 1 tablet (25 mg total) by mouth daily. 02/06/19 11/28/19 Yes Jakyah Bradby A, FNP  lovastatin (MEVACOR) 20 MG tablet Take 20 mg by mouth at bedtime.   Yes [provider]  magnesium oxide (MAG-OX) 400 MG tablet Take 400 mg by mouth 2 (two) times daily.   Yes [provider]  metolazone (ZAROXOLYN) 5 MG tablet Take 5 mg by mouth as needed (for a weight gain of 2 pounds overnight or 5 pounds in a week; MAX OF 5 TABLETS AT A TIME).  04/18/17  Yes [provider]  Multiple Vitamins-Minerals (CENTRUM WOMEN) TABS Take 1 tablet by mouth daily.    Yes [provider]  Omega-3 Fatty Acids (FISH OIL) 1000 MG CPDR Take 1 capsule by mouth daily.   Yes [provider]  pantoprazole (PROTONIX) 40 MG tablet Take 1 tablet (40 mg total) by mouth 2 (two) times daily before a meal. 10/12/18  Yes Fritzi Mandes, MD  polyethylene glycol (MIRALAX) 17 g packet Take 17 g by mouth daily. 06/23/19  Yes Vallarie Mare M, PA-C  potassium chloride SA (K-DUR) 20 MEQ tablet Take 1 tablet (20 mEq total) by mouth 2 (two) times daily. Take 20 mEq twice daily and  double dose when taking Metolazole 03/22/19  Yes Cherylanne Ardelean, Aura Fey, FNP  senna (SENOKOT) 8.6 MG TABS tablet Take 8.6-17.2 mg by mouth 2 (two) times daily. 17.2mg  (2 tablets) in the morning and 8.6mg  in the evening   Yes [provider]  torsemide (DEMADEX) 20 MG tablet TAKE 2 TABLETS BY MOUTH TWICE DAILY Patient taking differently: 40 mg.  06/10/19  Yes Darylene Price A, FNP  traMADol (ULTRAM) 50 MG tablet Take 1 tablet (50 mg total) by mouth every 6 (six) hours as needed for moderate pain. 10/22/19  Yes Letitia Neri L, PA-C  vitamin C (ASCORBIC ACID) 500 MG tablet Take 500 mg by mouth daily.   Yes [provider]  warfarin (COUMADIN) 5 MG tablet Take 3.5 mg by mouth daily at 6 PM.  04/22/17  Yes [provider]  HYDROcodone-acetaminophen (NORCO) 10-325 MG tablet Take 1 tablet by mouth every 6 (six) hours as needed for severe pain.     [provider]    Review of Systems  Constitutional: Positive for fatigue (tire easily). Negative for appetite change.  HENT: Negative for congestion, postnasal drip and sore throat.   Eyes: Positive for visual disturbance (blurry vision in left eye). Negative for pain.  Respiratory: Positive for shortness of breath (on moderate exertion). Negative for cough and chest tightness.   Cardiovascular: Positive for leg swelling. Negative for chest pain and palpitations.  Gastrointestinal: Negative for abdominal distention and abdominal pain.  Endocrine: Negative.   Genitourinary: Negative.   Musculoskeletal: Positive for arthralgias (right shoulder) and back pain.  Skin: Negative.   Allergic/Immunologic: Negative.   Neurological: Positive for dizziness (due to vertigo). Negative for weakness and light-headedness.  Hematological: Negative for adenopathy. Bruises/bleeds easily.  Psychiatric/Behavioral: Positive for dysphoric mood (husband passed away). Negative for sleep disturbance (sleeping on 3 pillows). The patient is not  nervous/anxious.    Vitals:   11/28/19 0939  BP: (!) 121/56  Pulse: (!) 59  Resp: 16  SpO2: 96%  Weight: 295 lb 8 oz (134 kg)  Height: 5\' 5"  (1.651 m)   Wt Readings from Last 3 Encounters:  11/28/19 295 lb 8 oz (134 kg)  10/22/19 293 lb (132.9 kg)  07/27/19 (!) 305 lb 9.6 oz (138.6 kg)   Lab Results  Component Value Date   CREATININE 2.49 (H) 07/26/2019   CREATININE 2.92 (H) 03/23/2019   CREATININE 1.83 (H) 12/20/2018    Physical Exam  Constitutional: She is oriented to person, place, and time. She appears well-developed and well-nourished.  HENT:  Head: Normocephalic and atraumatic.  Neck: No JVD present.  Cardiovascular: Regular rhythm. Bradycardia present.  Pulmonary/Chest: Effort normal. She has no wheezes. She has no rales.  Abdominal: Soft. She exhibits no distension. There is no abdominal tenderness.  Musculoskeletal:        General: Edema (1+ pitting edema in bilateral lower legs) present. No tenderness.     Cervical back: Normal range of motion and neck supple.  Neurological: She is alert and oriented to person, place, and time.  Skin: Skin is warm and dry.  Psychiatric: She has a normal mood and affect. Her behavior is normal. Thought content normal.  Nursing note and vitals reviewed.   Assessment & Plan:  1: Chronic heart failure with preserved ejection fraction- - NYHA class III - euvolemic today - weighing daily; reminded to call for an overnight weight gain of >2 pounds or a weekly weight gain of >5 pounds - weight down 20 pounds from last visit here 6 months ago - not adding salt to her food  - saw cardiologist (Fath) 05/23/2019 - BNP 10/12/2018 was 379.0 - PharmD reconciled medications with the patient  2: HTN- - BP looks good today - BMP 08/09/2019 reviewed and showed sodium 144, potassium 3.5, creatinine 1.84 and GFR 28 - saw PCP Wadie Lessen) 11/21/19  3: Diabetes- - A1c from 12/25/2018 was 6.5% - fasting glucose in clinic today was 71; juice  was given to the patient - saw endocrinologist Honor Junes) 07/02/2019 - saw nephrology Holley Raring) 04/23/2019  4: Depression- - patient says that her husband passed away within the last month due to brain tumor - reports having good emotional support and is trying to now take care of herself   Patient did not bring her medications nor a list. Each medication was verbally reviewed with the patient and she was encouraged to bring the bottles to every visit to confirm accuracy of list.  Return in 6 months or sooner for any questions/problems before then.

## 2019-11-28 ENCOUNTER — Inpatient Hospital Stay: Payer: Medicare Other

## 2019-11-28 ENCOUNTER — Other Ambulatory Visit: Payer: Self-pay

## 2019-11-28 ENCOUNTER — Encounter: Payer: Self-pay | Admitting: Family

## 2019-11-28 ENCOUNTER — Inpatient Hospital Stay: Payer: Medicare Other | Admitting: Oncology

## 2019-11-28 ENCOUNTER — Ambulatory Visit: Payer: Medicare Other | Attending: Family | Admitting: Family

## 2019-11-28 VITALS — BP 121/56 | HR 59 | Resp 16 | Ht 65.0 in | Wt 295.5 lb

## 2019-11-28 DIAGNOSIS — I130 Hypertensive heart and chronic kidney disease with heart failure and stage 1 through stage 4 chronic kidney disease, or unspecified chronic kidney disease: Secondary | ICD-10-CM | POA: Insufficient documentation

## 2019-11-28 DIAGNOSIS — Z87891 Personal history of nicotine dependence: Secondary | ICD-10-CM | POA: Insufficient documentation

## 2019-11-28 DIAGNOSIS — E039 Hypothyroidism, unspecified: Secondary | ICD-10-CM | POA: Insufficient documentation

## 2019-11-28 DIAGNOSIS — I5032 Chronic diastolic (congestive) heart failure: Secondary | ICD-10-CM | POA: Diagnosis not present

## 2019-11-28 DIAGNOSIS — N189 Chronic kidney disease, unspecified: Secondary | ICD-10-CM | POA: Diagnosis not present

## 2019-11-28 DIAGNOSIS — Z7901 Long term (current) use of anticoagulants: Secondary | ICD-10-CM | POA: Diagnosis not present

## 2019-11-28 DIAGNOSIS — G4733 Obstructive sleep apnea (adult) (pediatric): Secondary | ICD-10-CM | POA: Diagnosis not present

## 2019-11-28 DIAGNOSIS — Z794 Long term (current) use of insulin: Secondary | ICD-10-CM | POA: Diagnosis not present

## 2019-11-28 DIAGNOSIS — I4891 Unspecified atrial fibrillation: Secondary | ICD-10-CM | POA: Insufficient documentation

## 2019-11-28 DIAGNOSIS — E1140 Type 2 diabetes mellitus with diabetic neuropathy, unspecified: Secondary | ICD-10-CM | POA: Diagnosis not present

## 2019-11-28 DIAGNOSIS — E1122 Type 2 diabetes mellitus with diabetic chronic kidney disease: Secondary | ICD-10-CM | POA: Insufficient documentation

## 2019-11-28 DIAGNOSIS — J449 Chronic obstructive pulmonary disease, unspecified: Secondary | ICD-10-CM | POA: Insufficient documentation

## 2019-11-28 DIAGNOSIS — F329 Major depressive disorder, single episode, unspecified: Secondary | ICD-10-CM | POA: Diagnosis not present

## 2019-11-28 DIAGNOSIS — Z79899 Other long term (current) drug therapy: Secondary | ICD-10-CM | POA: Insufficient documentation

## 2019-11-28 DIAGNOSIS — Z8249 Family history of ischemic heart disease and other diseases of the circulatory system: Secondary | ICD-10-CM | POA: Insufficient documentation

## 2019-11-28 DIAGNOSIS — R5383 Other fatigue: Secondary | ICD-10-CM | POA: Insufficient documentation

## 2019-11-28 DIAGNOSIS — I10 Essential (primary) hypertension: Secondary | ICD-10-CM

## 2019-11-28 DIAGNOSIS — F419 Anxiety disorder, unspecified: Secondary | ICD-10-CM | POA: Insufficient documentation

## 2019-11-28 LAB — GLUCOSE, CAPILLARY: Glucose-Capillary: 71 mg/dL (ref 70–99)

## 2019-11-28 NOTE — Patient Instructions (Signed)
Continue weighing daily and call for an overnight weight gain of > 2 pounds or a weekly weight gain of >5 pounds. 

## 2019-11-29 ENCOUNTER — Inpatient Hospital Stay: Payer: Medicare Other

## 2019-11-29 ENCOUNTER — Inpatient Hospital Stay: Payer: Medicare Other | Admitting: Oncology

## 2019-12-03 ENCOUNTER — Inpatient Hospital Stay: Payer: Medicare Other

## 2019-12-03 ENCOUNTER — Other Ambulatory Visit: Payer: Self-pay

## 2019-12-03 DIAGNOSIS — D509 Iron deficiency anemia, unspecified: Secondary | ICD-10-CM

## 2019-12-03 DIAGNOSIS — N183 Chronic kidney disease, stage 3 unspecified: Secondary | ICD-10-CM | POA: Diagnosis present

## 2019-12-03 DIAGNOSIS — D631 Anemia in chronic kidney disease: Secondary | ICD-10-CM | POA: Diagnosis present

## 2019-12-03 LAB — CBC WITH DIFFERENTIAL/PLATELET
Abs Immature Granulocytes: 0.09 10*3/uL — ABNORMAL HIGH (ref 0.00–0.07)
Basophils Absolute: 0 10*3/uL (ref 0.0–0.1)
Basophils Relative: 0 %
Eosinophils Absolute: 0.2 10*3/uL (ref 0.0–0.5)
Eosinophils Relative: 2 %
HCT: 39.7 % (ref 36.0–46.0)
Hemoglobin: 11.8 g/dL — ABNORMAL LOW (ref 12.0–15.0)
Immature Granulocytes: 1 %
Lymphocytes Relative: 12 %
Lymphs Abs: 1.2 10*3/uL (ref 0.7–4.0)
MCH: 25.4 pg — ABNORMAL LOW (ref 26.0–34.0)
MCHC: 29.7 g/dL — ABNORMAL LOW (ref 30.0–36.0)
MCV: 85.6 fL (ref 80.0–100.0)
Monocytes Absolute: 0.9 10*3/uL (ref 0.1–1.0)
Monocytes Relative: 9 %
Neutro Abs: 7.7 10*3/uL (ref 1.7–7.7)
Neutrophils Relative %: 76 %
Platelets: 306 10*3/uL (ref 150–400)
RBC: 4.64 MIL/uL (ref 3.87–5.11)
RDW: 17.9 % — ABNORMAL HIGH (ref 11.5–15.5)
WBC: 10.1 10*3/uL (ref 4.0–10.5)
nRBC: 0 % (ref 0.0–0.2)

## 2019-12-03 LAB — IRON AND TIBC
Iron: 40 ug/dL (ref 28–170)
Saturation Ratios: 16 % (ref 10.4–31.8)
TIBC: 251 ug/dL (ref 250–450)
UIBC: 211 ug/dL

## 2019-12-03 LAB — FERRITIN: Ferritin: 98 ng/mL (ref 11–307)

## 2019-12-05 ENCOUNTER — Inpatient Hospital Stay (HOSPITAL_BASED_OUTPATIENT_CLINIC_OR_DEPARTMENT_OTHER): Payer: Medicare Other | Admitting: Oncology

## 2019-12-05 ENCOUNTER — Encounter: Payer: Self-pay | Admitting: Oncology

## 2019-12-05 ENCOUNTER — Other Ambulatory Visit: Payer: Self-pay

## 2019-12-05 ENCOUNTER — Inpatient Hospital Stay: Payer: Medicare Other

## 2019-12-05 VITALS — BP 110/51 | HR 53 | Temp 97.6°F | Resp 18 | Wt 292.2 lb

## 2019-12-05 DIAGNOSIS — D509 Iron deficiency anemia, unspecified: Secondary | ICD-10-CM | POA: Diagnosis not present

## 2019-12-05 DIAGNOSIS — N183 Chronic kidney disease, stage 3 unspecified: Secondary | ICD-10-CM | POA: Diagnosis not present

## 2019-12-05 DIAGNOSIS — D631 Anemia in chronic kidney disease: Secondary | ICD-10-CM

## 2019-12-05 NOTE — Progress Notes (Signed)
Patient here for follow up. Pt reports feeling tired.

## 2019-12-05 NOTE — Progress Notes (Signed)
Hematology/Oncology Follow up visit [facility] [phone] [phone]   Patient Care Team: Sofie Hartigan, MD as PCP - General (Family Medicine) Alisa Graff, FNP as Nurse Practitioner (Family Medicine) Ubaldo Glassing Javier Docker, MD as Consulting Physician (Cardiology) Elease Etienne, MD as Consulting Physician (Endocrinology)  CHIEF COMPLAINTS/REASON FOR VISIT Follow up for treatment of Anemia    HISTORY OF PRESENTING ILLNESS:  Andrea Rivers 64 y.o.  female with past medical history listed as below who was referred by Dr. Holley Raring to me for evaluation and management of anemia.  Patient was recently hospitalized due to acute respiratory failure secondary to diastolic CHF exacerbation. She was initially admitted to ICU, intubated and required mechanical ventilation. She also had acute on chronic kidney failure. Her recent labs showed microcytic anemia with hemoglobin  6.5, MCV 67.9. She received blood transfusion on 02/24/2017. Hemoglobin improved to 8.6. Patient reports fatigue, and a lack of energy. She has some lower extremity edema. She lives at home by herself and is able to do her ADLs. She takes warfarin for A. Fib. Denies any bleeding events, blood in the stool or black stool.  #  left distal femur comminuted fracture after mechanical fall, s/p ORIF done 11/17/17. Hemoglobin dropped to 7.7, s/p 2 units of PRBC transfusion.  # CT 05/09/2018 showed a possible soft tissue density 4 x 7 x 3.7 cm in the left lower rectum/anus questionable fecal impaction versus mass.  Discussed with patient during last visit about follow-up with Dr. Alice Reichert for further evaluation with a colonoscopy for direct visualization.  Patient agreed at that time but not willing to proceed with additional follow-up at this point.  INTERVAL HISTORY  64 y.o. female presents for follow up for management of chronic anemia. Patient has been on chronic anticoagulation for atrial  fibrillation.  She is on Coumadin Today she reports feeling tired.  Her husband passed away recently. Chronic lower extremity edema, close to baseline.  No change.  Review of Systems  Constitutional: Positive for fatigue. Negative for appetite change, chills and fever.  HENT:   Negative for hearing loss, lump/mass and voice change.   Eyes: Negative for eye problems.  Respiratory: Negative for chest tightness, cough and shortness of breath.   Cardiovascular: Positive for leg swelling. Negative for chest pain.  Gastrointestinal: Negative for abdominal distention, abdominal pain, blood in stool, constipation, diarrhea and nausea.  Endocrine: Negative for hot flashes.  Genitourinary: Negative for difficulty urinating, dysuria, frequency and hematuria.   Musculoskeletal: Negative for arthralgias, back pain, gait problem and neck pain.  Skin: Negative for itching and rash.  Neurological: Negative for dizziness, extremity weakness, gait problem and headaches.  Hematological: Negative for adenopathy. Does not bruise/bleed easily.  Psychiatric/Behavioral: Negative for confusion. The patient is not nervous/anxious.    MEDICAL HISTORY:  Past Medical History:  Diagnosis Date  . Anemia   . Anxiety   . CHF (congestive heart failure) ([place])   . Chronic kidney disease    INSUFFICIENCY  . Complication of anesthesia    had to be intubated during cataract surgery   " I could not breath "  . COPD (chronic obstructive pulmonary disease) ([place])   . Diabetes mellitus without complication ([place])   . Dyspnea    DOE  . Dysrhythmia    A FIB  . Edema   . Fracture of distal femur ([place]) 11/2017   left   . GERD (gastroesophageal reflux disease)   . History of kidney stones   .  Hypertension   . Hypothyroidism    ABLATION  . Iron deficiency anemia 03/31/2017  . Neuropathy   . Orthopnea   . Sleep apnea    CPAP  . Vertigo   . Wheezing     SURGICAL HISTORY: Past Surgical History:  Procedure Laterality  Date  . CATARACT EXTRACTION W/PHACO Left 01/26/2017   Procedure: CATARACT EXTRACTION PHACO AND INTRAOCULAR LENS PLACEMENT (IOC);  Surgeon: Estill Cotta, MD;  Location: [facility] ORS;  Service: Ophthalmology;  Laterality: Left;  [place]   1:02.2 AP     23.7 CDE   28.92 fluid pack lot# [id number] H exp.05/08/2018  . CHOLECYSTECTOMY    . ESOPHAGOGASTRODUODENOSCOPY N/A 10/12/2018   Procedure: ESOPHAGOGASTRODUODENOSCOPY (EGD);  Surgeon: Toledo, Benay Pike, MD;  Location: [facility];  Service: Gastroenterology;  Laterality: N/A;  . ORIF FEMUR FRACTURE Left 11/17/2017   Procedure: OPEN REDUCTION INTERNAL FIXATION (ORIF) DISTAL FEMUR FRACTURE;  Surgeon: Shona Needles, MD;  Location: [facility];  Service: Orthopedics;  Laterality: Left;  . TONSILLECTOMY      SOCIAL HISTORY: Social History   Socioeconomic History  . Marital status: Married    Spouse name: Not on file  . Number of children: Not on file  . Years of education: Not on file  . Highest education level: Not on file  Occupational History  . Occupation: retired  Tobacco Use  . Smoking status: Former Smoker    Packs/day: 3.00    Types: Cigarettes    Quit date: 1988    Years since quitting: 33.3  . Smokeless tobacco: Never Used  Substance and Sexual Activity  . Alcohol use: No  . Drug use: No  . Sexual activity: Not Currently  Other Topics Concern  . Not on file  Social History Narrative  . Not on file   Social Determinants of Health   Financial Resource Strain: Low Risk   . Difficulty of Paying Living Expenses: Not hard at all  Food Insecurity: No Food Insecurity  . Worried About Charity fundraiser in the Last Year: Never true  . Ran Out of Food in the Last Year: Never true  Transportation Needs: No Transportation Needs  . Lack of Transportation (Medical): No  . Lack of Transportation (Non-Medical): No  Physical Activity: Sufficiently Active  . Days of Exercise per Week: 7 days  . Minutes of Exercise per Session: 30 min    Stress: No Stress Concern Present  . Feeling of Stress : Only a little  Social Connections: Moderately Isolated  . Frequency of Communication with Friends and Family: Never  . Frequency of Social Gatherings with Friends and Family: Never  . Attends Religious Services: 1 to 4 times per year  . Active Member of Clubs or Organizations: No  . Attends Archivist Meetings: Never  . Marital Status: Never married  Intimate Partner Violence: Not At Risk  . Fear of Current or Ex-Partner: No  . Emotionally Abused: No  . Physically Abused: No  . Sexually Abused: No    FAMILY HISTORY: Family History  Problem Relation Age of Onset  . COPD Mother   . Heart disease Mother   . Anemia Mother   . Heart disease Father   . COPD Father   . Anemia Sister   . Diabetes Maternal Grandmother   . Hypertension Paternal Grandfather     ALLERGIES:  is allergic to other; latex; exenatide; garlic; onion; rosiglitazone; and erythromycin.  MEDICATIONS:  Current Outpatient Medications  Medication Sig Dispense Refill  .  acetaminophen (TYLENOL) 500 MG tablet Take 1,000 mg by mouth 2 (two) times daily as needed for mild pain, fever or headache.     . albuterol (PROAIR HFA) 108 (90 Base) MCG/ACT inhaler Inhale 2 puffs into the lungs every 4 (four) hours as needed for wheezing or shortness of breath.     Marland Kitchen amiodarone (PACERONE) 200 MG tablet Take 200 mg by mouth daily.  0  . Biotin 10 MG CAPS Take 1 capsule by mouth daily.     . busPIRone (BUSPAR) 5 MG tablet Take 5 mg by mouth 2 (two) times daily.    . Calcium Carbonate-Vitamin D3 (CALCIUM 600-D) 600-400 MG-UNIT TABS Take 1 tablet by mouth 3 (three) times daily with meals. Patient reports taking twice daily    . cetirizine (ZYRTEC) 10 MG tablet Take 10 mg by mouth daily.    . Cholecalciferol (D3 HIGH POTENCY) 125 MCG (5000 UT) capsule Take 5,000 Units by mouth daily.     . citalopram (CELEXA) 40 MG tablet Take 40 mg by mouth daily.     . clonazePAM  (KLONOPIN) 1 MG tablet Take 1 tablet by mouth 2 (two) times daily as needed for anxiety.     . cloNIDine (CATAPRES) 0.1 MG tablet Take 1 tablet (0.1 mg total) by mouth daily. 60 tablet 5  . Co-Enzyme Q-10 100 MG CAPS Take 100 mg by mouth daily.     . cyclobenzaprine (FLEXERIL) 5 MG tablet Take 5 mg by mouth 3 (three) times daily as needed for muscle spasms.    Marland Kitchen docusate sodium (COLACE) 50 MG capsule Take 100-200 mg by mouth 2 (two) times daily. 200mg  in the morning and 100mg  in the evening    . fluticasone (FLONASE) 50 MCG/ACT nasal spray SHAKE LQ AND U 1 SPR IEN QD    . gabapentin (NEURONTIN) 100 MG capsule Take 100 mg by mouth 3 (three) times daily.    . Garlic 4098 MG CAPS Take 1,000 mg by mouth daily.     Marland Kitchen glimepiride (AMARYL) 2 MG tablet Take 2 mg by mouth daily with breakfast.    . Insulin Detemir (LEVEMIR FLEXTOUCH) 100 UNIT/ML Pen Inject 23 Units into the skin at bedtime.     . insulin lispro (HUMALOG) 100 UNIT/ML injection Inject 15-20 Units into the skin 3 (three) times daily before meals. Up to 80units a day    . levothyroxine (SYNTHROID, LEVOTHROID) 175 MCG tablet Take 175 mcg by mouth every evening.     . linagliptin (TRADJENTA) 5 MG TABS tablet take 1 tablet by mouth once daily    . losartan (COZAAR) 25 MG tablet Take 1 tablet (25 mg total) by mouth daily. 90 tablet 3  . lovastatin (MEVACOR) 20 MG tablet Take 20 mg by mouth at bedtime.    . magnesium oxide (MAG-OX) 400 MG tablet Take 400 mg by mouth 2 (two) times daily.    . metolazone (ZAROXOLYN) 5 MG tablet Take 5 mg by mouth as needed (for a weight gain of 2 pounds overnight or 5 pounds in a week; MAX OF 5 TABLETS AT A TIME).     . Multiple Vitamins-Minerals (CENTRUM WOMEN) TABS Take 1 tablet by mouth daily.     . Omega-3 Fatty Acids (FISH OIL) 1000 MG CPDR Take 1 capsule by mouth daily.    . pantoprazole (PROTONIX) 40 MG tablet Take 1 tablet (40 mg total) by mouth 2 (two) times daily before a meal. 60 tablet 1  . potassium  chloride SA (  K-DUR) 20 MEQ tablet Take 1 tablet (20 mEq total) by mouth 2 (two) times daily. Take 20 mEq twice daily and double dose when taking Metolazole 200 tablet 3  . senna (SENOKOT) 8.6 MG TABS tablet Take 8.6-17.2 mg by mouth 2 (two) times daily. 17.2mg  (2 tablets) in the morning and 8.6mg  in the evening    . torsemide (DEMADEX) 20 MG tablet TAKE 2 TABLETS BY MOUTH TWICE DAILY (Patient taking differently: 40 mg. ) 360 tablet 3  . vitamin C (ASCORBIC ACID) 500 MG tablet Take 500 mg by mouth daily.    Marland Kitchen warfarin (COUMADIN) 5 MG tablet Take 3.5 mg by mouth daily at 6 PM.   0  . Fluticasone-Salmeterol (WIXELA INHUB) 250-50 MCG/DOSE AEPB Inhale 1 puff into the lungs 2 (two) times daily. Patient reports taking only PRN    . HYDROcodone-acetaminophen (NORCO) 10-325 MG tablet Take 1 tablet by mouth every 6 (six) hours as needed for severe pain.     . polyethylene glycol (MIRALAX) 17 g packet Take 17 g by mouth daily. (Patient not taking: Reported on 12/05/2019) 14 each 0  . traMADol (ULTRAM) 50 MG tablet Take 1 tablet (50 mg total) by mouth every 6 (six) hours as needed for moderate pain. (Patient not taking: Reported on 12/05/2019) 15 tablet 0   No current facility-administered medications for this visit.      Marland Kitchen  PHYSICAL EXAMINATION: ECOG PERFORMANCE STATUS: 2 - Symptomatic, <50% confined to bed Vitals:   12/05/19 1329  BP: (!) 110/51  Pulse: (!) 53  Resp: 18  Temp: 97.6 F (36.4 C)   Filed Weights   12/05/19 1329  Weight: 292 lb 3.2 oz (132.5 kg)   Physical Exam  Constitutional: She is oriented to person, place, and time. No distress.  Walks with a walker, morbidly obese.   HENT:  Head: Normocephalic and atraumatic.  Nose: Nose normal.  Mouth/Throat: Oropharynx is clear and moist. No oropharyngeal exudate.  Eyes: Pupils are equal, round, and reactive to light. Conjunctivae and EOM are normal. Right eye exhibits no discharge. Left eye exhibits no discharge. No scleral icterus.    Neck: No JVD present.  Cardiovascular: Normal rate and regular rhythm.  No murmur heard. Pulmonary/Chest: Effort normal and breath sounds normal. No respiratory distress. She has no wheezes. She has no rales. She exhibits no tenderness.  Abdominal: Soft. Bowel sounds are normal. She exhibits no distension. There is no abdominal tenderness.  Musculoskeletal:        General: Edema present. Normal range of motion.     Cervical back: Normal range of motion and neck supple.     Comments: Chronic 1+ edema,    Lymphadenopathy:    She has no cervical adenopathy.  Neurological: She is alert and oriented to person, place, and time. No cranial nerve deficit. She exhibits normal muscle tone. Coordination normal.  Skin: Skin is warm and dry. She is not diaphoretic. No erythema.  Bilateral LE vein insufficiency skin changes.  Scattered bruises on bilateral upper extremities  Psychiatric: Mood and affect normal.     LABORATORY DATA:  I have reviewed the data as listed CBC Latest Ref Rng & Units 12/03/2019 07/26/2019 03/23/2019  WBC 4.0 - 10.5 K/uL 10.1 13.9(H) 10.8(H)  Hemoglobin 12.0 - 15.0 g/dL 11.8(L) 11.4(L) 12.0  Hematocrit 36.0 - 46.0 % 39.7 37.1 39.2  Platelets 150 - 400 K/uL 306 228 214   Recent Labs    12/20/18 1100 03/23/19 0942 07/26/19 1535  NA 139 139  136  K 3.7 3.1* 3.0*  CL 91* 91* 90*  CO2 33* 34* 33*  GLUCOSE 100* 78 159*  BUN 39* 80* 82*  CREATININE 1.83* 2.92* 2.49*  CALCIUM 9.4 9.8 9.0  GFRNONAA 29* 16* 20*  GFRAA 33* 19* 23*  PROT 8.4* 8.4* 8.5*  ALBUMIN 3.5 3.9 3.4*  AST 23 22 19   ALT 12 15 14   ALKPHOS 83 76 79  BILITOT 0.7 0.5 0.3     Labs obtained at the nephrologist office showed Peripheral blood Protein electrophoresis revealed no monoclonal spike. Urine protein electrophoresis negative for monoclonal spike. Hemoglobin 7.9, WBC 12.1, MCV 72, RDW 19, platelet 283,000, neutrophil 9.2, lymphocyte 1.6, monocyte 1.0, a ANA negative.  ASSESSMENT & PLAN:  1.  Iron deficiency anemia, unspecified iron deficiency anemia type   2. Anemia of chronic kidney failure, stage 3 (moderate)    Labs reviewed and discussed with patient.   Iron deficiency anemia Patient's hemoglobin remained stable.  Hemoglobin at 11.8.  Iron panel also shows ferritin of 98, saturation 16. Hold off additional IV Venofer at this point Anemia of chronic kidney disease, hemoglobin is above 10.  No need for intervention at this point. . Orders Placed This Encounter  Procedures  . CBC with Differential/Platelet    Standing Status:   Future    Standing Expiration Date:   04/05/2021  . Ferritin    Standing Status:   Future    Standing Expiration Date:   04/05/2021  . Iron and TIBC    Standing Status:   Future    Standing Expiration Date:   04/05/2021    Return of visit: 4 months.  with CBC, TIBC, ferritin done one or 2 days prior to, possible Venofer.    Earlie Server, MD, PhD Hematology Oncology [facility] Pager- [phone] 12/05/2019

## 2020-03-07 ENCOUNTER — Ambulatory Visit: Payer: Medicare Other | Attending: Family | Admitting: Family

## 2020-03-07 ENCOUNTER — Other Ambulatory Visit: Payer: Self-pay

## 2020-03-07 ENCOUNTER — Encounter: Payer: Self-pay | Admitting: Family

## 2020-03-07 VITALS — BP 145/64 | HR 56 | Resp 20 | Ht 64.0 in | Wt 297.1 lb

## 2020-03-07 DIAGNOSIS — E1122 Type 2 diabetes mellitus with diabetic chronic kidney disease: Secondary | ICD-10-CM

## 2020-03-07 DIAGNOSIS — E1140 Type 2 diabetes mellitus with diabetic neuropathy, unspecified: Secondary | ICD-10-CM | POA: Diagnosis not present

## 2020-03-07 DIAGNOSIS — I4891 Unspecified atrial fibrillation: Secondary | ICD-10-CM | POA: Diagnosis not present

## 2020-03-07 DIAGNOSIS — J449 Chronic obstructive pulmonary disease, unspecified: Secondary | ICD-10-CM | POA: Insufficient documentation

## 2020-03-07 DIAGNOSIS — Z9989 Dependence on other enabling machines and devices: Secondary | ICD-10-CM | POA: Insufficient documentation

## 2020-03-07 DIAGNOSIS — I5032 Chronic diastolic (congestive) heart failure: Secondary | ICD-10-CM

## 2020-03-07 DIAGNOSIS — Z79899 Other long term (current) drug therapy: Secondary | ICD-10-CM | POA: Insufficient documentation

## 2020-03-07 DIAGNOSIS — E039 Hypothyroidism, unspecified: Secondary | ICD-10-CM | POA: Insufficient documentation

## 2020-03-07 DIAGNOSIS — Z881 Allergy status to other antibiotic agents status: Secondary | ICD-10-CM | POA: Insufficient documentation

## 2020-03-07 DIAGNOSIS — Z7901 Long term (current) use of anticoagulants: Secondary | ICD-10-CM | POA: Insufficient documentation

## 2020-03-07 DIAGNOSIS — Z79890 Hormone replacement therapy (postmenopausal): Secondary | ICD-10-CM | POA: Diagnosis not present

## 2020-03-07 DIAGNOSIS — N189 Chronic kidney disease, unspecified: Secondary | ICD-10-CM | POA: Diagnosis not present

## 2020-03-07 DIAGNOSIS — M1A0720 Idiopathic chronic gout, left ankle and foot, without tophus (tophi): Secondary | ICD-10-CM

## 2020-03-07 DIAGNOSIS — F419 Anxiety disorder, unspecified: Secondary | ICD-10-CM | POA: Insufficient documentation

## 2020-03-07 DIAGNOSIS — M109 Gout, unspecified: Secondary | ICD-10-CM | POA: Insufficient documentation

## 2020-03-07 DIAGNOSIS — G4733 Obstructive sleep apnea (adult) (pediatric): Secondary | ICD-10-CM | POA: Diagnosis not present

## 2020-03-07 DIAGNOSIS — Z8249 Family history of ischemic heart disease and other diseases of the circulatory system: Secondary | ICD-10-CM | POA: Insufficient documentation

## 2020-03-07 DIAGNOSIS — Z87891 Personal history of nicotine dependence: Secondary | ICD-10-CM | POA: Insufficient documentation

## 2020-03-07 DIAGNOSIS — I130 Hypertensive heart and chronic kidney disease with heart failure and stage 1 through stage 4 chronic kidney disease, or unspecified chronic kidney disease: Secondary | ICD-10-CM | POA: Diagnosis not present

## 2020-03-07 DIAGNOSIS — K219 Gastro-esophageal reflux disease without esophagitis: Secondary | ICD-10-CM | POA: Insufficient documentation

## 2020-03-07 DIAGNOSIS — Z833 Family history of diabetes mellitus: Secondary | ICD-10-CM | POA: Insufficient documentation

## 2020-03-07 DIAGNOSIS — Z9049 Acquired absence of other specified parts of digestive tract: Secondary | ICD-10-CM | POA: Insufficient documentation

## 2020-03-07 DIAGNOSIS — Z961 Presence of intraocular lens: Secondary | ICD-10-CM | POA: Diagnosis not present

## 2020-03-07 DIAGNOSIS — I10 Essential (primary) hypertension: Secondary | ICD-10-CM

## 2020-03-07 DIAGNOSIS — Z87442 Personal history of urinary calculi: Secondary | ICD-10-CM | POA: Diagnosis not present

## 2020-03-07 DIAGNOSIS — Z9842 Cataract extraction status, left eye: Secondary | ICD-10-CM | POA: Diagnosis not present

## 2020-03-07 DIAGNOSIS — Z794 Long term (current) use of insulin: Secondary | ICD-10-CM | POA: Insufficient documentation

## 2020-03-07 DIAGNOSIS — Z7951 Long term (current) use of inhaled steroids: Secondary | ICD-10-CM | POA: Diagnosis not present

## 2020-03-07 DIAGNOSIS — N183 Chronic kidney disease, stage 3 unspecified: Secondary | ICD-10-CM | POA: Diagnosis not present

## 2020-03-07 LAB — BASIC METABOLIC PANEL
Anion gap: 13 (ref 5–15)
BUN: 73 mg/dL — ABNORMAL HIGH (ref 8–23)
CO2: 36 mmol/L — ABNORMAL HIGH (ref 22–32)
Calcium: 9.2 mg/dL (ref 8.9–10.3)
Chloride: 90 mmol/L — ABNORMAL LOW (ref 98–111)
Creatinine, Ser: 1.83 mg/dL — ABNORMAL HIGH (ref 0.44–1.00)
GFR calc Af Amer: 33 mL/min — ABNORMAL LOW (ref >60)
GFR calc non Af Amer: 29 mL/min — ABNORMAL LOW (ref >60)
Glucose, Bld: 212 mg/dL — ABNORMAL HIGH (ref 70–99)
Potassium: 3.5 mmol/L (ref 3.5–5.1)
Sodium: 139 mmol/L (ref 135–145)

## 2020-03-07 LAB — GLUCOSE, CAPILLARY: Glucose-Capillary: 200 mg/dL — ABNORMAL HIGH (ref 70–99)

## 2020-03-07 LAB — URIC ACID: Uric Acid, Serum: 7 mg/dL (ref 2.5–7.1)

## 2020-03-07 NOTE — Patient Instructions (Signed)
Continue weighing daily and call for an overnight weight gain of > 2 pounds or a weekly weight gain of >5 pounds. 

## 2020-03-07 NOTE — Progress Notes (Signed)
Patient ID: Andrea Rivers, female    DOB: [birth year], 64 y.o.   MRN: [id number]  HPI  Andrea Rivers is a 64 y/o female with a history of anemia, anxiety, CKD, COPD, DM, GERD, HTN, hypothyroidism, neuropathy, obstructive sleep apnea (+CPAP), previous tobacco use and chronic heart failure.  Echo report from 10/13/2018 reviewed and showed an EF of 55-60%. Echo report from 02/23/17 reviewed and showed an EF of 55-60% along with mild MR. Echo done 01/26/17 shows an EF of 50-55%.   Was in the ED 10/22/19 due to cellulitis of right middle finger where she was treated and released.   She presents today because she would like some lab work drawn. She says that she's recently been started on febuxostat for her gout and was concerned about the potential of this making her renal function worse. She's also wondering if it's even helping decrease her uric acid levels because her feet still hurt. She did take metolazone the last 2 days because of her swelling and some weight gain which has helped. She has fatigue, shortness of breath, ankle edema, intermittent dizziness and bilateral feet pain. She denies any difficulty sleeping (most nights), abdominal distention, palpitations, chest pain or cough.   Past Medical History:  Diagnosis Date   Anemia    Anxiety    CHF (congestive heart failure) ([place])    Chronic kidney disease    INSUFFICIENCY   Complication of anesthesia    had to be intubated during cataract surgery   " I could not breath "   COPD (chronic obstructive pulmonary disease) ([place])    Diabetes mellitus without complication ([place])    Dyspnea    DOE   Dysrhythmia    A FIB   Edema    Fracture of distal femur ([place]) 11/2017   left    GERD (gastroesophageal reflux disease)    History of kidney stones    Hypertension    Hypothyroidism    ABLATION   Iron deficiency anemia 03/31/2017   Neuropathy    Orthopnea    Sleep apnea    CPAP   Vertigo    Wheezing    Past Surgical  History:  Procedure Laterality Date   CATARACT EXTRACTION W/PHACO Left 01/26/2017   Procedure: CATARACT EXTRACTION PHACO AND INTRAOCULAR LENS PLACEMENT ([place]);  Surgeon: Estill Cotta, MD;  Location: [facility] ORS;  Service: Ophthalmology;  Laterality: Left;  [place]   1:02.2 AP     23.7 CDE   28.92 fluid pack lot# [id number] H exp.05/08/2018   CHOLECYSTECTOMY     ESOPHAGOGASTRODUODENOSCOPY N/A 10/12/2018   Procedure: ESOPHAGOGASTRODUODENOSCOPY (EGD);  Surgeon: Toledo, Benay Pike, MD;  Location: [facility] ENDOS[facility];  Service: Gastroenterology;  Laterality: N/A;   ORIF FEMUR FRACTURE Left 11/17/2017   Procedure: OPEN REDUCTION INTERNAL FIXATION (ORIF) DISTAL FEMUR FRACTURE;  Surgeon: Shona Needles, MD;  Location: [facility];  Service: Orthopedics;  Laterality: Left;   TONSILLECTOMY     Family History  Problem Relation Age of Onset   COPD Mother    Heart disease Mother    Anemia Mother    Heart disease Father    COPD Father    Anemia Sister    Diabetes Maternal Grandmother    Hypertension Paternal Grandfather    Social History   Tobacco Use   Smoking status: Former Smoker    Packs/day: 3.00    Types: Cigarettes    Quit date: 1988    Years since quitting: 33.6   Smokeless tobacco: Never Used  Substance Use  Topics   Alcohol use: No   Allergies  Allergen Reactions   Other Other (See Comments)    ILOSONE- Caused GI distress also   Latex Hives   Exenatide Nausea Only and Other (See Comments)    [place]- Nausea and abdominal pain, also   Garlic Other (See Comments)    Severe acid reflux   Onion Other (See Comments)    Severe acid reflux   Rosiglitazone Other (See Comments)    [place]- Affected heart   Erythromycin Nausea And Vomiting    GI DISTRESS   Prior to Admission medications   Medication Sig Start Date End Date Taking? Authorizing Provider  acetaminophen (TYLENOL) 500 MG tablet Take 1,000 mg by mouth 2 (two) times daily as needed for mild pain, fever or headache.     Yes [provider]  albuterol (PROAIR HFA) 108 (90 Base) MCG/ACT inhaler Inhale 2 puffs into the lungs every 4 (four) hours as needed for wheezing or shortness of breath.  03/14/17  Yes [provider]  amiodarone (PACERONE) 200 MG tablet Take 200 mg by mouth daily. 05/10/17  Yes [provider]  Biotin 10 MG CAPS Take 1 capsule by mouth daily.    Yes [provider]  busPIRone (BUSPAR) 5 MG tablet Take 5 mg by mouth 2 (two) times daily. 12/03/19  Yes [provider]  Calcium Carbonate-Vitamin D3 (CALCIUM 600-D) 600-400 MG-UNIT TABS Take 1 tablet by mouth 3 (three) times daily with meals. Patient reports taking twice daily   Yes [provider]  cetirizine (ZYRTEC) 10 MG tablet Take 10 mg by mouth daily.   Yes [provider]  Cholecalciferol (D3 HIGH POTENCY) 125 MCG (5000 UT) capsule Take 5,000 Units by mouth daily.    Yes [provider]  citalopram (CELEXA) 40 MG tablet Take 40 mg by mouth daily.    Yes [provider]  clonazePAM (KLONOPIN) 1 MG tablet Take 1 tablet by mouth 2 (two) times daily as needed for anxiety.  05/17/18  Yes [provider]  cloNIDine (CATAPRES) 0.1 MG tablet Take 1 tablet (0.1 mg total) by mouth daily. 02/06/19  Yes Shan Valdes A, FNP  Co-Enzyme Q-10 100 MG CAPS Take 100 mg by mouth daily.    Yes [provider]  cyclobenzaprine (FLEXERIL) 5 MG tablet Take 5 mg by mouth 3 (three) times daily as needed for muscle spasms.   Yes [provider]  docusate sodium (COLACE) 50 MG capsule Take 100-200 mg by mouth 2 (two) times daily. 200mg  in the morning and 100mg  in the evening   Yes [provider]  febuxostat (ULORIC) 40 MG tablet Take 40 mg by mouth daily.   Yes [provider]  fluticasone (FLONASE) 50 MCG/ACT nasal spray SHAKE LQ AND U 1 SPR IEN QD 12/04/18  Yes [provider]  Fluticasone-Salmeterol (WIXELA INHUB) 250-50 MCG/DOSE AEPB Inhale  1 puff into the lungs 2 (two) times daily. Patient reports taking only PRN   Yes [provider]  gabapentin (NEURONTIN) 100 MG capsule Take 100 mg by mouth 3 (three) times daily.   Yes [provider]  Garlic 1610 MG CAPS Take 1,000 mg by mouth daily.    Yes [provider]  glimepiride (AMARYL) 2 MG tablet Take 2 mg by mouth daily with breakfast.   Yes [provider]  HYDROcodone-acetaminophen (NORCO) 10-325 MG tablet Take 1 tablet by mouth every 6 (six) hours as needed for severe pain.    Yes  [provider]  Insulin Detemir (LEVEMIR FLEXTOUCH) 100 UNIT/ML Pen Inject 23 Units into the skin at bedtime.  03/01/18  Yes [provider]  insulin lispro (HUMALOG) 100 UNIT/ML injection Inject 15-20 Units into the skin 3 (three) times daily before meals. Up to 80units a day   Yes [provider]  levothyroxine (SYNTHROID, LEVOTHROID) 175 MCG tablet Take 175 mcg by mouth every evening.    Yes [provider]  linagliptin (TRADJENTA) 5 MG TABS tablet take 1 tablet by mouth once daily 01/03/18  Yes [provider]  losartan (COZAAR) 25 MG tablet Take 1 tablet (25 mg total) by mouth daily. 02/06/19 03/07/20 Yes Morgen Ritacco A, FNP  lovastatin (MEVACOR) 20 MG tablet Take 20 mg by mouth at bedtime.   Yes [provider]  magnesium oxide (MAG-OX) 400 MG tablet Take 400 mg by mouth 2 (two) times daily.   Yes [provider]  metolazone (ZAROXOLYN) 5 MG tablet Take 5 mg by mouth as needed (for a weight gain of 2 pounds overnight or 5 pounds in a week; MAX OF 5 TABLETS AT A TIME).  04/18/17  Yes [provider]  Multiple Vitamins-Minerals (CENTRUM WOMEN) TABS Take 1 tablet by mouth daily.    Yes [provider]  Omega-3 Fatty Acids (FISH OIL) 1000 MG CPDR Take 1 capsule by mouth daily.   Yes [provider]  pantoprazole (PROTONIX) 40 MG tablet Take 1 tablet (40 mg total) by mouth 2 (two) times  daily before a meal. 10/12/18  Yes Fritzi Mandes, MD  polyethylene glycol (MIRALAX) 17 g packet Take 17 g by mouth daily. 06/23/19  Yes Vallarie Mare M, PA-C  potassium chloride SA (K-DUR) 20 MEQ tablet Take 1 tablet (20 mEq total) by mouth 2 (two) times daily. Take 20 mEq twice daily and double dose when taking Metolazole Patient taking differently: Take 40 mEq by mouth 2 (two) times daily. Take 20 mEq twice daily and double dose when taking Metolazole 03/22/19  Yes Dera Vanaken, Aura Fey, FNP  senna (SENOKOT) 8.6 MG TABS tablet Take 8.6-17.2 mg by mouth 2 (two) times daily. 17.2mg  (2 tablets) in the morning and 8.6mg  in the evening   Yes [provider]  torsemide (DEMADEX) 20 MG tablet TAKE 2 TABLETS BY MOUTH TWICE DAILY Patient taking differently: 40 mg.  06/10/19  Yes Darylene Price A, FNP  traMADol (ULTRAM) 50 MG tablet Take 1 tablet (50 mg total) by mouth every 6 (six) hours as needed for moderate pain. 10/22/19  Yes Letitia Neri L, PA-C  vitamin C (ASCORBIC ACID) 500 MG tablet Take 500 mg by mouth daily.   Yes [provider]  warfarin (COUMADIN) 5 MG tablet Take 3.5 mg by mouth daily at 6 PM.  04/22/17  Yes [provider]     Review of Systems  Constitutional: Positive for fatigue (tire easily). Negative for appetite change.  HENT: Negative for congestion, postnasal drip and sore throat.   Eyes: Negative for pain and visual disturbance.  Respiratory: Positive for shortness of breath (on moderate exertion). Negative for cough and chest tightness.   Cardiovascular: Positive for leg swelling. Negative for chest pain and palpitations.  Gastrointestinal: Negative for abdominal distention and abdominal pain.  Endocrine: Negative.   Genitourinary: Negative.   Musculoskeletal: Positive for arthralgias (right shoulder) and back pain.  Skin: Negative.   Allergic/Immunologic: Negative.   Neurological: Positive for dizziness (due to vertigo). Negative for weakness and  light-headedness.  Hematological: Negative for  adenopathy. Bruises/bleeds easily.  Psychiatric/Behavioral: Positive for dysphoric mood (husband passed away). Negative for sleep disturbance (sleeping on 3 pillows). The patient is not nervous/anxious.    Vitals:   03/07/20 0933  BP: (!) 145/64  Pulse: 56  Resp: 20  SpO2: 95%  Weight: (!) 297 lb 2 oz (134.8 kg)  Height: 5\' 4"  (1.626 m)   Wt Readings from Last 3 Encounters:  03/07/20 (!) 297 lb 2 oz (134.8 kg)  12/05/19 292 lb 3.2 oz (132.5 kg)  11/28/19 295 lb 8 oz (134 kg)   Lab Results  Component Value Date   CREATININE 2.49 (H) 07/26/2019   CREATININE 2.92 (H) 03/23/2019   CREATININE 1.83 (H) 12/20/2018    Physical Exam Vitals and nursing note reviewed.  Constitutional:      Appearance: She is well-developed.  HENT:     Head: Normocephalic and atraumatic.  Neck:     Vascular: No JVD.  Cardiovascular:     Rate and Rhythm: Regular rhythm. Bradycardia present.  Pulmonary:     Effort: Pulmonary effort is normal.     Breath sounds: No wheezing or rales.  Abdominal:     General: There is no distension.     Palpations: Abdomen is soft.     Tenderness: There is no abdominal tenderness.  Musculoskeletal:        General: No tenderness.     Cervical back: Normal range of motion and neck supple.     Right lower leg: Edema (1+ pitting around ankle) present.     Left lower leg: Edema (1+ pitting around ankle) present.  Skin:    General: Skin is warm and dry.  Neurological:     Mental Status: She is alert and oriented to person, place, and time.  Psychiatric:        Behavior: Behavior normal.        Thought Content: Thought content normal.     Assessment & Plan:  1: Chronic heart failure with preserved ejection fraction- - NYHA class III - euvolemic today - weighing daily; reminded to call for an overnight weight gain of >2 pounds or a weekly weight gain of >5 pounds - weight up 1.4 pounds from last visit here 3 months  ago - did take her metolazone with extra potassium the last 2 days - check BMP today - not adding salt to her food  - saw cardiologist (Fath) 12/05/19 - BNP 10/12/2018 was 379.0 - reports receiving both her COVID vaccines  2: HTN- - BP looks good today - BMP 02/04/20 reviewed and showed sodium 143, potassium 3.3, creatinine 2.08 and GFR 25 - saw PCP Wadie Lessen) 02/18/20  3: Diabetes- - A1c from 12/31/19 was 6.7% - fasting glucose in clinic today was 200 - saw endocrinologist Honor Junes) 12/31/19 - saw nephrology Holley Raring) 02/04/20  4: Gout- - currently taking febuxostat - will check uric acid today   Patient did not bring her medications nor a list. Each medication was verbally reviewed with the patient and she was encouraged to bring the bottles to every visit to confirm accuracy of list.  Return in 3 months or sooner for any questions/problems before then.

## 2020-03-13 ENCOUNTER — Other Ambulatory Visit: Payer: Self-pay | Admitting: Family

## 2020-03-26 ENCOUNTER — Telehealth: Payer: Self-pay | Admitting: Oncology

## 2020-03-26 NOTE — Telephone Encounter (Signed)
Patient phoned on this date and stated that she wanted to move her lab appt on 03-31-20 to the [facility]. Lab appt moved to [facility] per patient's request.

## 2020-03-31 ENCOUNTER — Other Ambulatory Visit: Payer: Medicare Other

## 2020-03-31 ENCOUNTER — Other Ambulatory Visit: Payer: Self-pay

## 2020-03-31 ENCOUNTER — Inpatient Hospital Stay: Payer: Medicare Other

## 2020-03-31 ENCOUNTER — Inpatient Hospital Stay: Payer: Medicare Other | Attending: Oncology

## 2020-03-31 DIAGNOSIS — N183 Chronic kidney disease, stage 3 unspecified: Secondary | ICD-10-CM | POA: Insufficient documentation

## 2020-03-31 DIAGNOSIS — I4891 Unspecified atrial fibrillation: Secondary | ICD-10-CM | POA: Diagnosis not present

## 2020-03-31 DIAGNOSIS — Z7901 Long term (current) use of anticoagulants: Secondary | ICD-10-CM | POA: Insufficient documentation

## 2020-03-31 DIAGNOSIS — I5032 Chronic diastolic (congestive) heart failure: Secondary | ICD-10-CM | POA: Insufficient documentation

## 2020-03-31 DIAGNOSIS — D631 Anemia in chronic kidney disease: Secondary | ICD-10-CM | POA: Insufficient documentation

## 2020-03-31 DIAGNOSIS — M109 Gout, unspecified: Secondary | ICD-10-CM | POA: Insufficient documentation

## 2020-03-31 DIAGNOSIS — D509 Iron deficiency anemia, unspecified: Secondary | ICD-10-CM | POA: Insufficient documentation

## 2020-03-31 DIAGNOSIS — I130 Hypertensive heart and chronic kidney disease with heart failure and stage 1 through stage 4 chronic kidney disease, or unspecified chronic kidney disease: Secondary | ICD-10-CM | POA: Diagnosis not present

## 2020-03-31 LAB — IRON AND TIBC
Iron: 23 ug/dL — ABNORMAL LOW (ref 28–170)
Saturation Ratios: 9 % — ABNORMAL LOW (ref 10.4–31.8)
TIBC: 272 ug/dL (ref 250–450)
UIBC: 249 ug/dL

## 2020-03-31 LAB — CBC WITH DIFFERENTIAL/PLATELET
Abs Immature Granulocytes: 0.14 10*3/uL — ABNORMAL HIGH (ref 0.00–0.07)
Basophils Absolute: 0 10*3/uL (ref 0.0–0.1)
Basophils Relative: 0 %
Eosinophils Absolute: 0 10*3/uL (ref 0.0–0.5)
Eosinophils Relative: 0 %
HCT: 37.4 % (ref 36.0–46.0)
Hemoglobin: 12.1 g/dL (ref 12.0–15.0)
Immature Granulocytes: 1 %
Lymphocytes Relative: 7 %
Lymphs Abs: 1.1 10*3/uL (ref 0.7–4.0)
MCH: 27.1 pg (ref 26.0–34.0)
MCHC: 32.4 g/dL (ref 30.0–36.0)
MCV: 83.7 fL (ref 80.0–100.0)
Monocytes Absolute: 1.2 10*3/uL — ABNORMAL HIGH (ref 0.1–1.0)
Monocytes Relative: 7 %
Neutro Abs: 13.8 10*3/uL — ABNORMAL HIGH (ref 1.7–7.7)
Neutrophils Relative %: 85 %
Platelets: 207 10*3/uL (ref 150–400)
RBC: 4.47 MIL/uL (ref 3.87–5.11)
RDW: 17.4 % — ABNORMAL HIGH (ref 11.5–15.5)
WBC: 16.3 10*3/uL — ABNORMAL HIGH (ref 4.0–10.5)
nRBC: 0 % (ref 0.0–0.2)

## 2020-03-31 LAB — FERRITIN: Ferritin: 113 ng/mL (ref 11–307)

## 2020-04-01 ENCOUNTER — Other Ambulatory Visit: Payer: Medicare Other

## 2020-04-02 ENCOUNTER — Inpatient Hospital Stay: Payer: Medicare Other

## 2020-04-02 ENCOUNTER — Encounter: Payer: Self-pay | Admitting: Oncology

## 2020-04-02 ENCOUNTER — Inpatient Hospital Stay (HOSPITAL_BASED_OUTPATIENT_CLINIC_OR_DEPARTMENT_OTHER): Payer: Medicare Other | Admitting: Oncology

## 2020-04-02 ENCOUNTER — Other Ambulatory Visit: Payer: Self-pay

## 2020-04-02 VITALS — BP 105/57 | HR 62 | Temp 96.5°F | Resp 18 | Wt 293.1 lb

## 2020-04-02 DIAGNOSIS — D72828 Other elevated white blood cell count: Secondary | ICD-10-CM | POA: Diagnosis not present

## 2020-04-02 DIAGNOSIS — N183 Chronic kidney disease, stage 3 unspecified: Secondary | ICD-10-CM

## 2020-04-02 DIAGNOSIS — D508 Other iron deficiency anemias: Secondary | ICD-10-CM | POA: Diagnosis not present

## 2020-04-02 DIAGNOSIS — M109 Gout, unspecified: Secondary | ICD-10-CM

## 2020-04-02 DIAGNOSIS — D631 Anemia in chronic kidney disease: Secondary | ICD-10-CM

## 2020-04-02 DIAGNOSIS — D509 Iron deficiency anemia, unspecified: Secondary | ICD-10-CM | POA: Diagnosis not present

## 2020-04-02 NOTE — Progress Notes (Signed)
Hematology/Oncology Follow up visit [facility] [phone]) [phone] [phone]   Patient Care Team: Sofie Hartigan, MD as PCP - General (Family Medicine) Alisa Graff, FNP as Nurse Practitioner (Family Medicine) Ubaldo Glassing Javier Docker, MD as Consulting Physician (Cardiology) Elease Etienne, MD as Consulting Physician (Endocrinology)  CHIEF COMPLAINTS/REASON FOR VISIT Follow up for treatment of Anemia    HISTORY OF PRESENTING ILLNESS:  Andrea Rivers 64 y.o.  female with past medical history listed as below who was referred by Dr. Holley Raring to me for evaluation and management of anemia.  Patient was recently hospitalized due to acute respiratory failure secondary to diastolic CHF exacerbation. She was initially admitted to ICU, intubated and required mechanical ventilation. She also had acute on chronic kidney failure. Her recent labs showed microcytic anemia with hemoglobin  6.5, MCV 67.9. She received blood transfusion on 02/24/2017. Hemoglobin improved to 8.6. Patient reports fatigue, and a lack of energy. She has some lower extremity edema. She lives at home by herself and is able to do her ADLs. She takes warfarin for A. Fib. Denies any bleeding events, blood in the stool or black stool.  #  left distal femur comminuted fracture after mechanical fall, s/p ORIF done 11/17/17. Hemoglobin dropped to 7.7, s/p 2 units of PRBC transfusion.  # CT 05/09/2018 showed a possible soft tissue density 4 x 7 x 3.7 cm in the left lower rectum/anus questionable fecal impaction versus mass.  Discussed with patient during last visit about follow-up with Dr. Alice Reichert for further evaluation with a colonoscopy for direct visualization.  Patient agreed at that time but not willing to proceed with additional follow-up at this point.  #Patient is widowed  INTERVAL HISTORY  64 y.o. female presents for follow up for management of chronic anemia. Patient has been on chronic  anticoagulation for atrial fibrillation.  She is on Coumadin Patient reports that recently she has had gout flare and was started on Urolic.  She also recently finished a course of prednisone.  Energy level is at baseline.  She has no new complaints.  Review of Systems  Constitutional: Positive for fatigue. Negative for appetite change, chills and fever.  HENT:   Negative for hearing loss, lump/mass and voice change.   Eyes: Negative for eye problems.  Respiratory: Negative for chest tightness, cough and shortness of breath.   Cardiovascular: Positive for leg swelling. Negative for chest pain.  Gastrointestinal: Negative for abdominal distention, abdominal pain, blood in stool, constipation, diarrhea and nausea.  Endocrine: Negative for hot flashes.  Genitourinary: Negative for difficulty urinating, dysuria, frequency and hematuria.   Musculoskeletal: Negative for arthralgias, back pain, gait problem and neck pain.       Gout flare  Skin: Negative for itching and rash.  Neurological: Negative for dizziness, extremity weakness, gait problem and headaches.  Hematological: Negative for adenopathy. Does not bruise/bleed easily.  Psychiatric/Behavioral: Negative for confusion. The patient is not nervous/anxious.    MEDICAL HISTORY:  Past Medical History:  Diagnosis Date  . Anemia   . Anxiety   . CHF (congestive heart failure) ([place])   . Chronic kidney disease    INSUFFICIENCY  . Complication of anesthesia    had to be intubated during cataract surgery   " I could not breath "  . COPD (chronic obstructive pulmonary disease) ([place])   . Diabetes mellitus without complication ([place])   . Dyspnea    DOE  . Dysrhythmia    A FIB  . Edema   .  Fracture of distal femur ([place]) 11/2017   left   . GERD (gastroesophageal reflux disease)   . History of kidney stones   . Hypertension   . Hypothyroidism    ABLATION  . Iron deficiency anemia 03/31/2017  . Neuropathy   . Orthopnea   . Sleep apnea     CPAP  . Vertigo   . Wheezing     SURGICAL HISTORY: Past Surgical History:  Procedure Laterality Date  . CATARACT EXTRACTION W/PHACO Left 01/26/2017   Procedure: CATARACT EXTRACTION PHACO AND INTRAOCULAR LENS PLACEMENT (IOC);  Surgeon: Estill Cotta, MD;  Location: [facility] ORS;  Service: Ophthalmology;  Laterality: Left;  [place]   1:02.2 AP     23.7 CDE   28.92 fluid pack lot# [id number] H exp.05/08/2018  . CHOLECYSTECTOMY    . ESOPHAGOGASTRODUODENOSCOPY N/A 10/12/2018   Procedure: ESOPHAGOGASTRODUODENOSCOPY (EGD);  Surgeon: Toledo, Benay Pike, MD;  Location: [facility] ENDOSCOPY;  Service: Gastroenterology;  Laterality: N/A;  . ORIF FEMUR FRACTURE Left 11/17/2017   Procedure: OPEN REDUCTION INTERNAL FIXATION (ORIF) DISTAL FEMUR FRACTURE;  Surgeon: Shona Needles, MD;  Location: [facility];  Service: Orthopedics;  Laterality: Left;  . TONSILLECTOMY      SOCIAL HISTORY: Social History   Socioeconomic History  . Marital status: Married    Spouse name: Not on file  . Number of children: Not on file  . Years of education: Not on file  . Highest education level: Not on file  Occupational History  . Occupation: retired  Tobacco Use  . Smoking status: Former Smoker    Packs/day: 3.00    Types: Cigarettes    Quit date: 1988    Years since quitting: 33.6  . Smokeless tobacco: Never Used  Vaping Use  . Vaping Use: Never used  Substance and Sexual Activity  . Alcohol use: No  . Drug use: No  . Sexual activity: Not Currently  Other Topics Concern  . Not on file  Social History Narrative  . Not on file   Social Determinants of Health   Financial Resource Strain: Low Risk   . Difficulty of Paying Living Expenses: Not hard at all  Food Insecurity: No Food Insecurity  . Worried About Charity fundraiser in the Last Year: Never true  . Ran Out of Food in the Last Year: Never true  Transportation Needs: No Transportation Needs  . Lack of Transportation (Medical): No  . Lack of Transportation  (Non-Medical): No  Physical Activity: Sufficiently Active  . Days of Exercise per Week: 7 days  . Minutes of Exercise per Session: 30 min  Stress: No Stress Concern Present  . Feeling of Stress : Only a little  Social Connections: Socially Isolated  . Frequency of Communication with Friends and Family: Never  . Frequency of Social Gatherings with Friends and Family: Never  . Attends Religious Services: 1 to 4 times per year  . Active Member of Clubs or Organizations: No  . Attends Archivist Meetings: Never  . Marital Status: Never married  Intimate Partner Violence: Not At Risk  . Fear of Current or Ex-Partner: No  . Emotionally Abused: No  . Physically Abused: No  . Sexually Abused: No    FAMILY HISTORY: Family History  Problem Relation Age of Onset  . COPD Mother   . Heart disease Mother   . Anemia Mother   . Heart disease Father   . COPD Father   . Anemia Sister   . Diabetes Maternal Grandmother   .  Hypertension Paternal Grandfather     ALLERGIES:  is allergic to other, latex, exenatide, garlic, onion, rosiglitazone, and erythromycin.  MEDICATIONS:  Current Outpatient Medications  Medication Sig Dispense Refill  . acetaminophen (TYLENOL) 500 MG tablet Take 1,000 mg by mouth 2 (two) times daily as needed for mild pain, fever or headache.     . albuterol (PROAIR HFA) 108 (90 Base) MCG/ACT inhaler Inhale 2 puffs into the lungs every 4 (four) hours as needed for wheezing or shortness of breath.     Marland Kitchen amiodarone (PACERONE) 200 MG tablet Take 200 mg by mouth daily.  0  . Biotin 10 MG CAPS Take 1 capsule by mouth daily.     . busPIRone (BUSPAR) 5 MG tablet Take 5 mg by mouth 2 (two) times daily.    . Calcium Carbonate-Vitamin D3 (CALCIUM 600-D) 600-400 MG-UNIT TABS Take 1 tablet by mouth 3 (three) times daily with meals. Patient reports taking twice daily    . cetirizine (ZYRTEC) 10 MG tablet Take 10 mg by mouth daily.    . Cholecalciferol (D3 HIGH POTENCY) 125 MCG  (5000 UT) capsule Take 5,000 Units by mouth daily.     . citalopram (CELEXA) 40 MG tablet Take 40 mg by mouth daily.     . clonazePAM (KLONOPIN) 1 MG tablet Take 1 tablet by mouth 2 (two) times daily as needed for anxiety.     . cloNIDine (CATAPRES) 0.1 MG tablet Take 1 tablet (0.1 mg total) by mouth daily. 60 tablet 5  . Co-Enzyme Q-10 100 MG CAPS Take 100 mg by mouth daily.     . cyclobenzaprine (FLEXERIL) 5 MG tablet Take 5 mg by mouth 3 (three) times daily as needed for muscle spasms.    Marland Kitchen docusate sodium (COLACE) 50 MG capsule Take 100-200 mg by mouth 2 (two) times daily. 200mg  in the morning and 100mg  in the evening    . febuxostat (ULORIC) 40 MG tablet Take 40 mg by mouth daily.     . fluticasone (FLONASE) 50 MCG/ACT nasal spray SHAKE LQ AND U 1 SPR IEN QD    . Fluticasone-Salmeterol (WIXELA INHUB) 250-50 MCG/DOSE AEPB Inhale 1 puff into the lungs 2 (two) times daily. Patient reports taking only PRN    . gabapentin (NEURONTIN) 100 MG capsule Take 100 mg by mouth 3 (three) times daily.    . Garlic 3614 MG CAPS Take 1,000 mg by mouth daily.     Marland Kitchen glimepiride (AMARYL) 2 MG tablet Take 2 mg by mouth daily with breakfast.    . Insulin Detemir (LEVEMIR FLEXTOUCH) 100 UNIT/ML Pen Inject 23 Units into the skin at bedtime.     . insulin lispro (HUMALOG) 100 UNIT/ML injection Inject 15-20 Units into the skin 3 (three) times daily before meals. Up to 80units a day    . levothyroxine (SYNTHROID, LEVOTHROID) 175 MCG tablet Take 175 mcg by mouth every evening.     . linagliptin (TRADJENTA) 5 MG TABS tablet take 1 tablet by mouth once daily    . lovastatin (MEVACOR) 20 MG tablet Take 20 mg by mouth at bedtime.    . magnesium oxide (MAG-OX) 400 MG tablet Take 400 mg by mouth 2 (two) times daily.    . metolazone (ZAROXOLYN) 5 MG tablet Take 5 mg by mouth as needed (for a weight gain of 2 pounds overnight or 5 pounds in a week; MAX OF 5 TABLETS AT A TIME).     . Multiple Vitamins-Minerals (CENTRUM WOMEN)  TABS Take 1  tablet by mouth daily.     . Omega-3 Fatty Acids (FISH OIL) 1000 MG CPDR Take 1 capsule by mouth daily.    . pantoprazole (PROTONIX) 40 MG tablet Take 1 tablet (40 mg total) by mouth 2 (two) times daily before a meal. 60 tablet 1  . polyethylene glycol (MIRALAX) 17 g packet Take 17 g by mouth daily. 14 each 0  . potassium chloride SA (KLOR-CON) 20 MEQ tablet TAKE 1 TABLET BY MOUTH TWICE DAILY AND DOUBLE DOSE WHEN TAKING METOLAZOLE 200 tablet 3  . senna (SENOKOT) 8.6 MG TABS tablet Take 8.6-17.2 mg by mouth 2 (two) times daily. 17.2mg  (2 tablets) in the morning and 8.6mg  in the evening    . torsemide (DEMADEX) 20 MG tablet TAKE 2 TABLETS BY MOUTH TWICE DAILY (Patient taking differently: 40 mg. ) 360 tablet 3  . traMADol (ULTRAM) 50 MG tablet Take 1 tablet (50 mg total) by mouth every 6 (six) hours as needed for moderate pain. 15 tablet 0  . vitamin C (ASCORBIC ACID) 500 MG tablet Take 500 mg by mouth daily.    Marland Kitchen warfarin (COUMADIN) 5 MG tablet Take 3.5 mg by mouth daily at 6 PM.   0  . HYDROcodone-acetaminophen (NORCO) 10-325 MG tablet Take 1 tablet by mouth every 6 (six) hours as needed for severe pain.  (Patient not taking: Reported on 04/02/2020)    . losartan (COZAAR) 25 MG tablet Take 1 tablet (25 mg total) by mouth daily. 90 tablet 3   No current facility-administered medications for this visit.      Marland Kitchen  PHYSICAL EXAMINATION: ECOG PERFORMANCE STATUS: 2 - Symptomatic, <50% confined to bed Vitals:   04/02/20 1302  BP: (!) 105/57  Pulse: 62  Resp: 18  Temp: (!) 96.5 F (35.8 C)   Filed Weights   04/02/20 1302  Weight: 293 lb 1.6 oz (132.9 kg)   Physical Exam Constitutional:      General: She is not in acute distress.    Appearance: She is not diaphoretic.     Comments: Walks with a walker, morbidly obese.   HENT:     Head: Normocephalic and atraumatic.     Nose: Nose normal.     Mouth/Throat:     Pharynx: No oropharyngeal exudate.  Eyes:     General: No scleral  icterus.       Right eye: No discharge.        Left eye: No discharge.     Conjunctiva/sclera: Conjunctivae normal.     Pupils: Pupils are equal, round, and reactive to light.  Neck:     Vascular: No JVD.  Cardiovascular:     Rate and Rhythm: Normal rate and regular rhythm.     Heart sounds: No murmur heard.   Pulmonary:     Effort: Pulmonary effort is normal. No respiratory distress.     Breath sounds: Normal breath sounds. No wheezing or rales.  Chest:     Chest wall: No tenderness.  Abdominal:     General: Bowel sounds are normal. There is no distension.     Palpations: Abdomen is soft.     Tenderness: There is no abdominal tenderness.  Musculoskeletal:        General: Normal range of motion.     Cervical back: Normal range of motion and neck supple.     Comments: Chronic 1+ edema,    Lymphadenopathy:     Cervical: No cervical adenopathy.  Skin:    General: Skin is  warm and dry.     Findings: No erythema.     Comments: Bilateral LE vein insufficiency skin changes.  Scattered bruises on bilateral upper extremities  Neurological:     Mental Status: She is alert and oriented to person, place, and time.     Cranial Nerves: No cranial nerve deficit.     Motor: No abnormal muscle tone.     Coordination: Coordination normal.  Psychiatric:        Mood and Affect: Mood and affect normal.      LABORATORY DATA:  I have reviewed the data as listed CBC Latest Ref Rng & Units 03/31/2020 12/03/2019 07/26/2019  WBC 4.0 - 10.5 K/uL 16.3(H) 10.1 13.9(H)  Hemoglobin 12.0 - 15.0 g/dL 12.1 11.8(L) 11.4(L)  Hematocrit 36 - 46 % 37.4 39.7 37.1  Platelets 150 - 400 K/uL 207 306 228   Recent Labs    07/26/19 1535 03/07/20 1020  NA 136 139  K 3.0* 3.5  CL 90* 90*  CO2 33* 36*  GLUCOSE 159* 212*  BUN 82* 73*  CREATININE 2.49* 1.83*  CALCIUM 9.0 9.2  GFRNONAA 20* 29*  GFRAA 23* 33*  PROT 8.5*  --   ALBUMIN 3.4*  --   AST 19  --   ALT 14  --   ALKPHOS 79  --   BILITOT 0.3  --       Labs obtained at the nephrologist office showed Peripheral blood Protein electrophoresis revealed no monoclonal spike. Urine protein electrophoresis negative for monoclonal spike. Hemoglobin 7.9, WBC 12.1, MCV 72, RDW 19, platelet 283,000, neutrophil 9.2, lymphocyte 1.6, monocyte 1.0, a ANA negative.  ASSESSMENT & PLAN:  1. Other iron deficiency anemia   2. Anemia of chronic kidney failure, stage 3 (moderate)   3. Acute gout, unspecified cause, unspecified site   4. Other elevated white blood cell (WBC) count    #Anemia secondary to chronic kidney disease. Labs are reviewed and discussed with patient. Iron panel showed stable ferritin.  Iron panel is consistent wit  anemia secondary to chronic disease. Hemoglobin is normal.  I will hold additional IV Venofer treatments. Anemia of chronic kidney disease, hemoglobin is above 10.  No need for intervention at this point. CKD, avoid nephrotoxin.  Gout, continue Urolic.  Leukocytosis, likely reactive to recent gout inflammation and prednisone course.  I will hold off work-up for now.  . Orders Placed This Encounter  Procedures  . CBC with Differential/Platelet    Standing Status:   Future    Standing Expiration Date:   04/02/2021  . Comprehensive metabolic panel    Standing Status:   Future    Standing Expiration Date:   04/02/2021  . Ferritin    Standing Status:   Future    Standing Expiration Date:   04/02/2021  . Iron and TIBC    Standing Status:   Future    Standing Expiration Date:   04/02/2021    Return of visit: 6 months.  with CBC, TIBC, ferritin done one or 2 days prior to, possible Venofer.    Earlie Server, MD, PhD Hematology Oncology [facility] Pager- [phone] 04/02/2020

## 2020-04-02 NOTE — Progress Notes (Signed)
Patient is being treated by rhematoloist for gout.

## 2020-05-21 ENCOUNTER — Other Ambulatory Visit: Payer: Self-pay | Admitting: Family

## 2020-05-25 ENCOUNTER — Emergency Department: Payer: Medicare Other

## 2020-05-25 ENCOUNTER — Other Ambulatory Visit: Payer: Self-pay

## 2020-05-25 DIAGNOSIS — R002 Palpitations: Secondary | ICD-10-CM | POA: Insufficient documentation

## 2020-05-25 DIAGNOSIS — Z5321 Procedure and treatment not carried out due to patient leaving prior to being seen by health care provider: Secondary | ICD-10-CM | POA: Insufficient documentation

## 2020-05-25 LAB — CBC
HCT: 38.5 % (ref 36.0–46.0)
Hemoglobin: 11.7 g/dL — ABNORMAL LOW (ref 12.0–15.0)
MCH: 26.9 pg (ref 26.0–34.0)
MCHC: 30.4 g/dL (ref 30.0–36.0)
MCV: 88.5 fL (ref 80.0–100.0)
Platelets: 201 10*3/uL (ref 150–400)
RBC: 4.35 MIL/uL (ref 3.87–5.11)
RDW: 18.2 % — ABNORMAL HIGH (ref 11.5–15.5)
WBC: 13.7 10*3/uL — ABNORMAL HIGH (ref 4.0–10.5)
nRBC: 0 % (ref 0.0–0.2)

## 2020-05-25 NOTE — ED Triage Notes (Signed)
Patient c/o palpitations beginning approx 3 hours ago. Patient reports hx of afib. Patient denies chest pain.

## 2020-05-26 ENCOUNTER — Emergency Department
Admission: EM | Admit: 2020-05-26 | Discharge: 2020-05-26 | Disposition: A | Discharge status: Home / Self Care | Payer: Medicare Other | Attending: Emergency Medicine | Admitting: Emergency Medicine

## 2020-05-26 LAB — BASIC METABOLIC PANEL
Anion gap: 14 (ref 5–15)
BUN: 70 mg/dL — ABNORMAL HIGH (ref 8–23)
CO2: 32 mmol/L (ref 22–32)
Calcium: 8.5 mg/dL — ABNORMAL LOW (ref 8.9–10.3)
Chloride: 95 mmol/L — ABNORMAL LOW (ref 98–111)
Creatinine, Ser: 2.17 mg/dL — ABNORMAL HIGH (ref 0.44–1.00)
GFR, Estimated: 23 mL/min — ABNORMAL LOW (ref >60)
Glucose, Bld: 242 mg/dL — ABNORMAL HIGH (ref 70–99)
Potassium: 3.3 mmol/L — ABNORMAL LOW (ref 3.5–5.1)
Sodium: 141 mmol/L (ref 135–145)

## 2020-05-26 LAB — TSH: TSH: 34.601 u[IU]/mL — ABNORMAL HIGH (ref 0.350–4.500)

## 2020-05-26 LAB — T4, FREE: Free T4: 0.97 ng/dL (ref 0.61–1.12)

## 2020-05-26 LAB — TROPONIN I (HIGH SENSITIVITY): Troponin I (High Sensitivity): 15 ng/L (ref <18)

## 2020-05-26 NOTE — ED Notes (Signed)
Patient assisted to the bathroom. Unsteady gait but able to maneuver from standing to commode without much assistance.

## 2020-05-27 ENCOUNTER — Telehealth: Payer: Self-pay | Admitting: Emergency Medicine

## 2020-05-27 NOTE — Telephone Encounter (Signed)
Called patient due to lwot to inquire about condition and follow up plans. Left message.   

## 2020-05-28 NOTE — Progress Notes (Signed)
Patient ID: Andrea Rivers, female    DOB: [birth year], 64 y.o.   MRN: [id number]  HPI  Andrea Rivers is a 64 y/o female with a history of anemia, anxiety, CKD, COPD, DM, GERD, HTN, hypothyroidism, neuropathy, obstructive sleep apnea (+CPAP), previous tobacco use and chronic heart failure.  Echo report from 10/13/2018 reviewed and showed an EF of 55-60%. Echo report from 02/23/17 reviewed and showed an EF of 55-60% along with mild MR. Echo done 01/26/17 shows an EF of 50-55%.   Was in the ED 05/26/20 but LWBS.   She presents today for a follow-up visit with a chief complaint of moderate fatigue upon minimal exertion. She describes this as chronic in nature having been present for several years. She has associated shortness of breath, pedal edema, dizziness (chronic), easy bruising, chronic pain, intermittent depression and current gout pain along with this. She denies any difficulty sleeping, abdominal distention, palpitations, chest pain or cough.   Currently being treated with prednisone for recent gout flare and has noticed an increase in her ankle swelling during this time.   Past Medical History:  Diagnosis Date  . Anemia   . Anxiety   . CHF (congestive heart failure) ([place])   . Chronic kidney disease    INSUFFICIENCY  . Complication of anesthesia    had to be intubated during cataract surgery   " I could not breath "  . COPD (chronic obstructive pulmonary disease) ([place])   . Diabetes mellitus without complication ([place])   . Dyspnea    DOE  . Dysrhythmia    A FIB  . Edema   . Fracture of distal femur ([place]) 11/2017   left   . GERD (gastroesophageal reflux disease)   . History of kidney stones   . Hypertension   . Hypothyroidism    ABLATION  . Iron deficiency anemia 03/31/2017  . Neuropathy   . Orthopnea   . Sleep apnea    CPAP  . Vertigo   . Wheezing    Past Surgical History:  Procedure Laterality Date  . CATARACT EXTRACTION W/PHACO Left 01/26/2017   Procedure: CATARACT  EXTRACTION PHACO AND INTRAOCULAR LENS PLACEMENT (IOC);  Surgeon: Estill Cotta, MD;  Location: [facility] ORS;  Service: Ophthalmology;  Laterality: Left;  [place]   1:02.2 AP     23.7 CDE   28.92 fluid pack lot# [id number] H exp.05/08/2018  . CHOLECYSTECTOMY    . ESOPHAGOGASTRODUODENOSCOPY N/A 10/12/2018   Procedure: ESOPHAGOGASTRODUODENOSCOPY (EGD);  Surgeon: Toledo, Benay Pike, MD;  Location: [facility];  Service: Gastroenterology;  Laterality: N/A;  . ORIF FEMUR FRACTURE Left 11/17/2017   Procedure: OPEN REDUCTION INTERNAL FIXATION (ORIF) DISTAL FEMUR FRACTURE;  Surgeon: Shona Needles, MD;  Location: [facility];  Service: Orthopedics;  Laterality: Left;  . TONSILLECTOMY     Family History  Problem Relation Age of Onset  . COPD Mother   . Heart disease Mother   . Anemia Mother   . Heart disease Father   . COPD Father   . Anemia Sister   . Diabetes Maternal Grandmother   . Hypertension Paternal Grandfather    Social History   Tobacco Use  . Smoking status: Former Smoker    Packs/day: 3.00    Types: Cigarettes    Quit date: 1988    Years since quitting: 33.8  . Smokeless tobacco: Never Used  Substance Use Topics  . Alcohol use: No   Allergies  Allergen Reactions  . Other Other (See Comments)    ILOSONE- Caused  GI distress also  . Latex Hives  . Exenatide Nausea Only and Other (See Comments)    Byetta- Nausea and abdominal pain, also  . Garlic Other (See Comments)    Severe acid reflux  . Onion Other (See Comments)    Severe acid reflux  . Rosiglitazone Other (See Comments)    Avandia- Affected heart  . Erythromycin Nausea And Vomiting    GI DISTRESS   Prior to Admission medications   Medication Sig Start Date End Date Taking? Authorizing Provider  acetaminophen (TYLENOL) 500 MG tablet Take 1,000 mg by mouth 2 (two) times daily as needed for mild pain, fever or headache.    Yes [provider]  albuterol (PROAIR HFA) 108 (90 Base) MCG/ACT inhaler Inhale 2 puffs into  the lungs every 4 (four) hours as needed for wheezing or shortness of breath.  03/14/17  Yes [provider]  amiodarone (PACERONE) 200 MG tablet Take 200 mg by mouth daily. 05/10/17  Yes [provider]  Biotin 10 MG CAPS Take 1 capsule by mouth daily.    Yes [provider]  busPIRone (BUSPAR) 5 MG tablet Take 5 mg by mouth 2 (two) times daily. 12/03/19  Yes [provider]  Calcium Carbonate-Vitamin D3 (CALCIUM 600-D) 600-400 MG-UNIT TABS Take 1 tablet by mouth 3 (three) times daily with meals. Patient reports taking twice daily   Yes [provider]  cetirizine (ZYRTEC) 10 MG tablet Take 10 mg by mouth daily.   Yes [provider]  Cholecalciferol (D3 HIGH POTENCY) 125 MCG (5000 UT) capsule Take 5,000 Units by mouth daily.    Yes [provider]  citalopram (CELEXA) 40 MG tablet Take 40 mg by mouth daily.    Yes [provider]  clonazePAM (KLONOPIN) 1 MG tablet Take 1 tablet by mouth 2 (two) times daily as needed for anxiety.  05/17/18  Yes [provider]  cloNIDine (CATAPRES) 0.1 MG tablet Take 1 tablet (0.1 mg total) by mouth daily. 02/06/19  Yes Salimah Martinovich A, FNP  Co-Enzyme Q-10 100 MG CAPS Take 100 mg by mouth daily.    Yes [provider]  cyclobenzaprine (FLEXERIL) 5 MG tablet Take 5 mg by mouth 3 (three) times daily as needed for muscle spasms.   Yes [provider]  docusate sodium (COLACE) 50 MG capsule Take 100-200 mg by mouth 2 (two) times daily. 200mg  in the morning and 100mg  in the evening   Yes [provider]  febuxostat (ULORIC) 40 MG tablet Take 40 mg by mouth daily.    Yes [provider]  fluticasone (FLONASE) 50 MCG/ACT nasal spray SHAKE LQ AND U 1 SPR IEN QD 12/04/18  Yes [provider]  Fluticasone-Salmeterol (WIXELA INHUB) 250-50 MCG/DOSE AEPB Inhale 1 puff into the lungs 2 (two) times daily. Patient reports taking only PRN   Yes [provider]  gabapentin (NEURONTIN) 100 MG capsule Take 100 mg by mouth 3 (three) times daily.   Yes [provider]  Garlic 9741 MG CAPS Take 1,000 mg by mouth daily.    Yes [provider]  glimepiride (AMARYL) 2 MG tablet Take 2 mg by mouth daily with breakfast.   Yes [provider]  HYDROcodone-acetaminophen (NORCO) 10-325 MG tablet Take 1 tablet by mouth every 6 (six) hours as needed for severe pain.    Yes [provider]  Insulin Detemir (LEVEMIR FLEXTOUCH) 100 UNIT/ML Pen Inject 23 Units into the skin at bedtime.  03/01/18  Yes [provider]  insulin lispro (HUMALOG) 100 UNIT/ML injection Inject 15-20 Units into the skin 3 (three) times daily before meals. Up to 80units a day   Yes [provider]  levothyroxine (SYNTHROID, LEVOTHROID) 175 MCG tablet Take 175 mcg by mouth every evening.    Yes [provider]  linagliptin (TRADJENTA) 5 MG TABS tablet take 1 tablet by mouth once daily 01/03/18  Yes [provider]  losartan (COZAAR) 25 MG tablet Take 1 tablet (25 mg total) by mouth daily. 02/06/19 05/29/20 Yes Gricel Copen A, FNP  lovastatin (MEVACOR) 20 MG tablet Take 20 mg by mouth at bedtime.   Yes [provider]  magnesium oxide (MAG-OX) 400 MG tablet Take 400 mg by mouth 2 (two) times daily.   Yes [provider]  metolazone (ZAROXOLYN) 5 MG tablet Take 5 mg by mouth as needed (for a weight gain of 2 pounds overnight or 5 pounds in a week; MAX OF 5 TABLETS AT A TIME).  04/18/17  Yes [provider]  Multiple Vitamins-Minerals (CENTRUM WOMEN) TABS Take 1 tablet by mouth daily.    Yes [provider]  Omega-3 Fatty Acids (FISH OIL) 1000 MG CPDR Take 1 capsule by mouth daily.   Yes [provider]  pantoprazole (PROTONIX) 40 MG tablet Take 1 tablet (40 mg total) by mouth 2 (two) times daily before a meal. 10/12/18  Yes Fritzi Mandes, MD  polyethylene glycol (MIRALAX) 17 g packet Take 17 g  by mouth daily. 06/23/19  Yes Vallarie Mare M, PA-C  potassium chloride SA (KLOR-CON) 20 MEQ tablet TAKE 1 TABLET BY MOUTH TWICE DAILY AND DOUBLE DOSE WHEN TAKING METOLAZOLE 03/13/20  Yes Darylene Price A, FNP  senna (SENOKOT) 8.6 MG TABS tablet Take 8.6-17.2 mg by mouth 2 (two) times daily. 17.2mg  (2 tablets) in the morning and 8.6mg  in the evening   Yes [provider]  torsemide (DEMADEX) 20 MG tablet TAKE 2 TABLETS BY MOUTH TWICE DAILY Patient taking differently: 40 mg 2 (two) times daily.  05/21/20  Yes Osiris Odriscoll, Otila Kluver A, FNP  traMADol (ULTRAM) 50 MG tablet Take 1 tablet (50 mg total) by mouth every 6 (six) hours as needed for moderate pain. 10/22/19  Yes Letitia Neri L, PA-C  vitamin C (ASCORBIC ACID) 500 MG tablet Take 500 mg by mouth daily.   Yes [provider]  warfarin (COUMADIN) 5 MG tablet Take 3.5 mg by mouth daily at 6 PM.  04/22/17  Yes [provider]    Review of Systems  Constitutional: Positive for fatigue (tire easily). Negative for appetite change.  HENT: Negative for congestion, postnasal drip and sore throat.   Eyes: Negative for pain and visual disturbance.  Respiratory: Positive for shortness of breath (on moderate exertion). Negative for cough and chest tightness.   Cardiovascular: Positive for leg swelling (right ankle w/ prednisone). Negative for chest pain and palpitations.  Gastrointestinal: Negative for abdominal distention and abdominal pain.  Endocrine: Negative.   Genitourinary: Negative.   Musculoskeletal: Positive for arthralgias (numerous joints due to gout pain) and back pain.  Skin: Negative.   Allergic/Immunologic: Negative.   Neurological: Positive for dizziness (due to vertigo). Negative for weakness and light-headedness.  Hematological: Negative for adenopathy. Bruises/bleeds easily.  Psychiatric/Behavioral: Positive for dysphoric mood (husband passed away). Negative for sleep disturbance (sleeping on 3 pillows). The patient  is not nervous/anxious.    Vitals:   05/29/20 0935  BP: (!) 128/53  Pulse: 68  Resp: 20  SpO2: 99%  Weight: (!) 302 lb 8 oz (137.2 kg)  Height: 5\' 5"  (1.651 m)   Wt Readings from Last 3 Encounters:  05/29/20 (!) 302 lb 8 oz (137.2 kg)  05/25/20 292 lb 15.9 oz (132.9 kg)  04/02/20 293 lb 1.6 oz (132.9 kg)   Lab Results  Component Value Date   CREATININE 2.17 (H) 05/25/2020   CREATININE 1.83 (H) 03/07/2020   CREATININE 2.49 (H) 07/26/2019    Physical Exam Vitals and nursing note reviewed.  Constitutional:      Appearance: She is well-developed.  HENT:     Head: Normocephalic and atraumatic.  Neck:     Vascular: No JVD.  Cardiovascular:     Rate and Rhythm: Normal rate and regular rhythm.  Pulmonary:     Effort: Pulmonary effort is normal.     Breath sounds: No wheezing or rales.  Abdominal:     General: There is no distension.     Palpations: Abdomen is soft.     Tenderness: There is no abdominal tenderness.  Musculoskeletal:        General: No tenderness.     Cervical back: Normal range of motion and neck supple.     Right lower leg: Edema (1+ pitting around ankle) present.     Left lower leg: Edema (1+ pitting around ankle) present.  Skin:    General: Skin is warm and dry.  Neurological:     Mental Status: She is alert and oriented to person, place, and time.  Psychiatric:        Behavior: Behavior normal.        Thought Content: Thought content normal.     Assessment & Plan:  1: Chronic heart failure with preserved ejection fraction- - NYHA class III - euvolemic today - weighing daily; reminded to call for an overnight weight gain of >2 pounds or a weekly weight gain of >5 pounds - weight up 5 pounds from last visit here 3 months ago - not adding salt to her food  - saw cardiologist (Fath) 12/05/19 - BNP 10/12/2018 was 379.0 - reports receiving both her COVID vaccines - received flu vaccine for this season  2: HTN- - BP looks good today - BMP  05/26/20 reviewed and showed sodium 143, potassium 3.6, creatinine 1.9 and GFR 27 - saw PCP Wadie Lessen) 05/22/20  3: Diabetes- - A1c from 12/31/19 was 6.7% - fasting glucose in clinic today was 181 - saw endocrinologist Honor Junes) 12/31/19 - saw nephrology Holley Raring) 05/13/20   Patient did not bring her medications nor a list. Each medication was verbally reviewed with the patient and she was encouraged to bring the bottles to every visit to confirm accuracy of list.  Return in 6 months or sooner for any questions/problems before then.

## 2020-05-29 ENCOUNTER — Encounter: Payer: Self-pay | Admitting: Family

## 2020-05-29 ENCOUNTER — Other Ambulatory Visit: Payer: Self-pay

## 2020-05-29 ENCOUNTER — Ambulatory Visit: Payer: Medicare Other | Attending: Family | Admitting: Family

## 2020-05-29 VITALS — BP 128/53 | HR 68 | Resp 20 | Ht 65.0 in | Wt 302.5 lb

## 2020-05-29 DIAGNOSIS — E039 Hypothyroidism, unspecified: Secondary | ICD-10-CM | POA: Insufficient documentation

## 2020-05-29 DIAGNOSIS — Z79899 Other long term (current) drug therapy: Secondary | ICD-10-CM | POA: Insufficient documentation

## 2020-05-29 DIAGNOSIS — F32A Depression, unspecified: Secondary | ICD-10-CM | POA: Insufficient documentation

## 2020-05-29 DIAGNOSIS — J449 Chronic obstructive pulmonary disease, unspecified: Secondary | ICD-10-CM | POA: Insufficient documentation

## 2020-05-29 DIAGNOSIS — Z87891 Personal history of nicotine dependence: Secondary | ICD-10-CM | POA: Insufficient documentation

## 2020-05-29 DIAGNOSIS — M109 Gout, unspecified: Secondary | ICD-10-CM | POA: Diagnosis not present

## 2020-05-29 DIAGNOSIS — Z7901 Long term (current) use of anticoagulants: Secondary | ICD-10-CM | POA: Diagnosis not present

## 2020-05-29 DIAGNOSIS — Z79890 Hormone replacement therapy (postmenopausal): Secondary | ICD-10-CM | POA: Diagnosis not present

## 2020-05-29 DIAGNOSIS — Z794 Long term (current) use of insulin: Secondary | ICD-10-CM | POA: Insufficient documentation

## 2020-05-29 DIAGNOSIS — Z9989 Dependence on other enabling machines and devices: Secondary | ICD-10-CM | POA: Insufficient documentation

## 2020-05-29 DIAGNOSIS — E1140 Type 2 diabetes mellitus with diabetic neuropathy, unspecified: Secondary | ICD-10-CM | POA: Insufficient documentation

## 2020-05-29 DIAGNOSIS — I130 Hypertensive heart and chronic kidney disease with heart failure and stage 1 through stage 4 chronic kidney disease, or unspecified chronic kidney disease: Secondary | ICD-10-CM | POA: Diagnosis present

## 2020-05-29 DIAGNOSIS — G4733 Obstructive sleep apnea (adult) (pediatric): Secondary | ICD-10-CM | POA: Insufficient documentation

## 2020-05-29 DIAGNOSIS — I5032 Chronic diastolic (congestive) heart failure: Secondary | ICD-10-CM | POA: Diagnosis not present

## 2020-05-29 DIAGNOSIS — E1122 Type 2 diabetes mellitus with diabetic chronic kidney disease: Secondary | ICD-10-CM | POA: Insufficient documentation

## 2020-05-29 DIAGNOSIS — I10 Essential (primary) hypertension: Secondary | ICD-10-CM

## 2020-05-29 DIAGNOSIS — D631 Anemia in chronic kidney disease: Secondary | ICD-10-CM | POA: Diagnosis not present

## 2020-05-29 DIAGNOSIS — N189 Chronic kidney disease, unspecified: Secondary | ICD-10-CM | POA: Insufficient documentation

## 2020-05-29 LAB — GLUCOSE, CAPILLARY: Glucose-Capillary: 181 mg/dL — ABNORMAL HIGH (ref 70–99)

## 2020-05-29 NOTE — Patient Instructions (Signed)
Continue weighing daily and call for an overnight weight gain of > 2 pounds or a weekly weight gain of >5 pounds. 

## 2020-09-29 ENCOUNTER — Inpatient Hospital Stay: Payer: Medicare Other | Attending: Oncology

## 2020-10-01 ENCOUNTER — Inpatient Hospital Stay: Payer: Medicare Other

## 2020-10-01 ENCOUNTER — Inpatient Hospital Stay: Payer: Medicare Other | Admitting: Oncology

## 2020-10-20 ENCOUNTER — Inpatient Hospital Stay: Payer: Medicare Other | Attending: Oncology

## 2020-10-21 ENCOUNTER — Telehealth: Payer: Self-pay

## 2020-10-21 NOTE — Telephone Encounter (Signed)
Patient did not come for lab appt on 3/14.  Please add lab encounter to appts tomorrow (3/15).  These appts were r/s from 2/21 lab NS and MD/Venofer appt on 2/23.

## 2020-10-21 NOTE — Telephone Encounter (Signed)
Done..  Tried calling pt to make her aware to come 45mns earlier to have her labs drawn But I was unable to reach her. Her telephone message kept saying the call could not be completed at this time. I'll try again later.

## 2020-10-22 ENCOUNTER — Inpatient Hospital Stay: Payer: Medicare Other | Admitting: Oncology

## 2020-10-22 ENCOUNTER — Inpatient Hospital Stay: Payer: Medicare Other

## 2020-11-03 ENCOUNTER — Inpatient Hospital Stay: Payer: Medicare Other | Attending: Oncology

## 2020-11-05 ENCOUNTER — Encounter: Payer: Self-pay | Admitting: Oncology

## 2020-11-05 ENCOUNTER — Inpatient Hospital Stay: Payer: Medicare Other

## 2020-11-05 ENCOUNTER — Inpatient Hospital Stay: Payer: Medicare Other | Admitting: Oncology

## 2020-11-24 NOTE — Progress Notes (Deleted)
Patient ID: Andrea Rivers, female    DOB: Apr 11, 1956, 65 y.o.   MRN: [id number]  HPI  Andrea Rivers is a 65 y/o female with a history of anemia, anxiety, CKD, COPD, DM, GERD, HTN, hypothyroidism, neuropathy, obstructive sleep apnea (+CPAP), previous tobacco use and chronic heart failure.  Echo report from 10/13/2018 reviewed and showed an EF of 55-60%. Echo report from 02/23/17 reviewed and showed an EF of 55-60% along with mild MR. Echo done 01/26/17 shows an EF of 50-55%.   Has not been admitted or been in the ED in the last 6 months  She presents today for a follow-up visit with a chief complaint of   Past Medical History:  Diagnosis Date  . Anemia   . Anxiety   . CHF (congestive heart failure) ([place])   . Chronic kidney disease    INSUFFICIENCY  . Complication of anesthesia    had to be intubated during cataract surgery   " I could not breath "  . COPD (chronic obstructive pulmonary disease) ([place])   . Diabetes mellitus without complication ([place])   . Dyspnea    DOE  . Dysrhythmia    A FIB  . Edema   . Fracture of distal femur ([place]) 11/2017   left   . GERD (gastroesophageal reflux disease)   . History of kidney stones   . Hypertension   . Hypothyroidism    ABLATION  . Iron deficiency anemia 03/31/2017  . Neuropathy   . Orthopnea   . Sleep apnea    CPAP  . Vertigo   . Wheezing    Past Surgical History:  Procedure Laterality Date  . CATARACT EXTRACTION W/PHACO Left 01/26/2017   Procedure: CATARACT EXTRACTION PHACO AND INTRAOCULAR LENS PLACEMENT (IOC);  Surgeon: Estill Cotta, MD;  Location: [facility] OR[facility];  Service: Ophthalmology;  Laterality: Left;  [place]   1:02.2 AP     23.7 CDE   28.92 fluid pack lot# [id number] H exp.05/08/2018  . CHOLECYSTECTOMY    . ESOPHAGOGASTRODUODENOSCOPY N/A 10/12/2018   Procedure: ESOPHAGOGASTRODUODENOSCOPY (EGD);  Surgeon: Toledo, Benay Pike, MD;  Location: [facility];  Service: Gastroenterology;  Laterality: N/A;  . ORIF FEMUR FRACTURE Left  11/17/2017   Procedure: OPEN REDUCTION INTERNAL FIXATION (ORIF) DISTAL FEMUR FRACTURE;  Surgeon: Shona Needles, MD;  Location: [facility];  Service: Orthopedics;  Laterality: Left;  . TONSILLECTOMY     Family History  Problem Relation Age of Onset  . COPD Mother   . Heart disease Mother   . Anemia Mother   . Heart disease Father   . COPD Father   . Anemia Sister   . Diabetes Maternal Grandmother   . Hypertension Paternal Grandfather    Social History   Tobacco Use  . Smoking status: Former Smoker    Packs/day: 3.00    Types: Cigarettes    Quit date: 1988    Years since quitting: 34.3  . Smokeless tobacco: Never Used  Substance Use Topics  . Alcohol use: No   Allergies  Allergen Reactions  . Other Other (See Comments)    ILOSONE- Caused GI distress also  . Latex Hives  . Exenatide Nausea Only and Other (See Comments)    Byetta- Nausea and abdominal pain, also  . Garlic Other (See Comments)    Severe acid reflux  . Onion Other (See Comments)    Severe acid reflux  . Rosiglitazone Other (See Comments)    Avandia- Affected heart  . Erythromycin Nausea And Vomiting  GI DISTRESS     Review of Systems  Constitutional: Positive for fatigue (tire easily). Negative for appetite change.  HENT: Negative for congestion, postnasal drip and sore throat.   Eyes: Negative for pain and visual disturbance.  Respiratory: Positive for shortness of breath (on moderate exertion). Negative for cough and chest tightness.   Cardiovascular: Positive for leg swelling (right ankle w/ prednisone). Negative for chest pain and palpitations.  Gastrointestinal: Negative for abdominal distention and abdominal pain.  Endocrine: Negative.   Genitourinary: Negative.   Musculoskeletal: Positive for arthralgias (numerous joints due to gout pain) and back pain.  Skin: Negative.   Allergic/Immunologic: Negative.   Neurological: Positive for dizziness (due to vertigo). Negative for weakness and  light-headedness.  Hematological: Negative for adenopathy. Bruises/bleeds easily.  Psychiatric/Behavioral: Positive for dysphoric mood (husband passed away). Negative for sleep disturbance (sleeping on 3 pillows). The patient is not nervous/anxious.      Physical Exam Vitals and nursing note reviewed.  Constitutional:      Appearance: She is well-developed.  HENT:     Head: Normocephalic and atraumatic.  Neck:     Vascular: No JVD.  Cardiovascular:     Rate and Rhythm: Normal rate and regular rhythm.  Pulmonary:     Effort: Pulmonary effort is normal.     Breath sounds: No wheezing or rales.  Abdominal:     General: There is no distension.     Palpations: Abdomen is soft.     Tenderness: There is no abdominal tenderness.  Musculoskeletal:        General: No tenderness.     Cervical back: Normal range of motion and neck supple.     Right lower leg: Edema (1+ pitting around ankle) present.     Left lower leg: Edema (1+ pitting around ankle) present.  Skin:    General: Skin is warm and dry.  Neurological:     Mental Status: She is alert and oriented to person, place, and time.  Psychiatric:        Behavior: Behavior normal.        Thought Content: Thought content normal.     Assessment & Plan:  1: Chronic heart failure with preserved ejection fraction- - NYHA class III - euvolemic today - weighing daily; reminded to call for an overnight weight gain of >2 pounds or a weekly weight gain of >5 pounds - weight 302.8 pounds from last visit here 6 months ago - not adding salt to her food  - saw cardiologist (Fath) 12/05/19 - BNP 10/12/2018 was 379.0  2: HTN- - BP  - BMP 10/22/20 reviewed and showed sodium 142, potassium 3.1, creatinine 2.0 and GFR 27 - saw PCP Wadie Lessen) 09/02/20  3: Diabetes- - A1c from 07/07/20 was 6.6% - fasting glucose in clinic today was  - saw endocrinologist Honor Junes) 07/07/20 - saw nephrology Holley Raring) 05/13/20   Patient did not bring her  medications nor a list. Each medication was verbally reviewed with the patient and she was encouraged to bring the bottles to every visit to confirm accuracy of list.

## 2020-11-25 ENCOUNTER — Ambulatory Visit: Payer: Medicare Other | Admitting: Family

## 2020-11-25 ENCOUNTER — Telehealth: Payer: Self-pay | Admitting: Family

## 2020-11-25 NOTE — Telephone Encounter (Signed)
Patient did not show for her Heart Failure Clinic appointment on 11/25/20. Will attempt to reschedule.

## 2020-12-29 ENCOUNTER — Encounter: Payer: Self-pay | Admitting: Oncology

## 2020-12-29 ENCOUNTER — Other Ambulatory Visit: Payer: Self-pay | Admitting: Internal Medicine

## 2020-12-29 NOTE — Progress Notes (Signed)
error 

## 2020-12-30 ENCOUNTER — Other Ambulatory Visit (HOSPITAL_COMMUNITY): Payer: Medicare Other | Admitting: Internal Medicine

## 2020-12-30 DIAGNOSIS — I4819 Other persistent atrial fibrillation: Secondary | ICD-10-CM

## 2020-12-30 DIAGNOSIS — I5032 Chronic diastolic (congestive) heart failure: Secondary | ICD-10-CM

## 2020-12-30 DIAGNOSIS — J9621 Acute and chronic respiratory failure with hypoxia: Secondary | ICD-10-CM | POA: Diagnosis not present

## 2020-12-30 DIAGNOSIS — I5033 Acute on chronic diastolic (congestive) heart failure: Secondary | ICD-10-CM | POA: Diagnosis not present

## 2020-12-30 NOTE — Progress Notes (Signed)
[facility]  PROGRESS NOTE  PULMONARY SERVICE ROUNDS   Andrea Rivers  DOB: 1956-07-13  Referring physician: Deanne Coffer, MD  HPI: Andrea Rivers is a 65 y.o. female  being seen for Acute on Chronic Respiratory Failure.  Patient is resting comfortably right now without distress at this time has been on pressure support wean good volumes are noted  Review of Systems: Unremarkable other than noted in HPI  Allergies:  Reviewed on the [facility]  Medications: Reviewed  Vitals: Temperature is 96.6 pulse 56 respiratory 15 blood pressure is 141/75 saturations 100  Ventilator Settings: On pressure support FiO2 30% pressure 14/7  Physical Exam: . General:  calm and comfortable NAD . Eyes: normal lids, irises & conjunctiva . ENT: grossly normal tongue not enlarged . Neck: no masses . Cardiovascular: S1 S2 Normal no rubs no gallop . Respiratory: No rhonchi no rales are noted at this time . Abdomen: soft non-distended . Skin: no rash seen on limited exam . Musculoskeletal:  no rigidity . Psychiatric: unable to assess . Neurologic: no involuntary movements          Lab Data and radiological Data:  No rhonchi very coarse breath sounds are noted   Assessment/Plan  Patient Active Problem List   Diagnosis Date Noted  . Anemia of chronic kidney failure, stage 3 (moderate) ([place]) 04/02/2020  . Acute gout 04/02/2020  . CHF (congestive heart failure) ([place]) 10/15/2018  . Acute on chronic diastolic CHF (congestive heart failure) ([place]) 10/13/2018  . Hypokalemia 06/08/2018  . Lymphedema 06/08/2018  . Closed displaced supracondylar fracture of distal end of left femur with intracondylar extension ([place]) 11/17/2017  . CKD (chronic kidney disease) 11/14/2017  . Hypothyroidism 11/14/2017  . Atrial fibrillation ([place]) 11/14/2017  . Anxiety state 11/14/2017  . Peripheral neuropathy 11/14/2017  . Bradycardia 08/13/2017  . Microcytic anemia 03/31/2017  . Iron deficiency  anemia 03/31/2017  . Chronic diastolic heart failure ([place]) 03/10/2017  . HTN (hypertension) 03/10/2017  . Diabetes ([place]) 03/10/2017  . Obstructive sleep apnea 03/10/2017      1. Acute on chronic respiratory failure hypoxia plan is going to be to continue with trying to wean on pressure support patient does appear to be doing little bit better we will advance as tolerated. 2. Chronic atrial fibrillation rate now rate is controlled 3. Congestive heart failure chronic diastolic supportive care monitor fluid status closely. 4. Chronic kidney disease stage III we will monitor the patient's labs closely and fluid status also   I have personally evaluated the patient, evaluated the laboratory and imaging results and formulated the assessment and plan and placed orders as needed. The Patient requires high complexity decision making with multiple system involvement. Rounds were done with the Respiratory Therapy Director and respiratory therapist involved in the care of the patient as well as nursing staff.   Allyne Gee, MD [facility] Pulmonary Critical Care Medicine   This note is for inpatient care

## 2020-12-31 ENCOUNTER — Other Ambulatory Visit (HOSPITAL_COMMUNITY): Payer: Medicare Other | Admitting: Internal Medicine

## 2020-12-31 DIAGNOSIS — I5033 Acute on chronic diastolic (congestive) heart failure: Secondary | ICD-10-CM | POA: Diagnosis not present

## 2020-12-31 DIAGNOSIS — I5032 Chronic diastolic (congestive) heart failure: Secondary | ICD-10-CM

## 2020-12-31 DIAGNOSIS — R001 Bradycardia, unspecified: Secondary | ICD-10-CM

## 2020-12-31 DIAGNOSIS — N183 Chronic kidney disease, stage 3 unspecified: Secondary | ICD-10-CM

## 2020-12-31 DIAGNOSIS — I4819 Other persistent atrial fibrillation: Secondary | ICD-10-CM | POA: Diagnosis not present

## 2020-12-31 NOTE — Progress Notes (Signed)
[name]  PROGRESS NOTE  PULMONARY SERVICE ROUNDS   [name]  DOB: [birth year]  Referring physician: Deanne Coffer, MD  HPI: Andrea Rivers is a 65 y.o. female  being seen for Acute on Chronic Respiratory Failure.  Patient at this time is on pressure support supposed to be trying T collar today  Review of Systems: Unremarkable other than noted in HPI  Allergies:  Reviewed on the [facility]  Medications: Reviewed  Vitals: Temperature 96.4 pulse 56 respiratory rate is 13 blood pressure is 170/79 saturations 99%  Ventilator Settings: On pressure support FiO2 40% pressure 14/7  Physical Exam: . General:  calm and comfortable NAD . Eyes: normal lids, irises & conjunctiva . ENT: grossly normal tongue not enlarged . Neck: no masses . Cardiovascular: S1 S2 Normal no rubs no gallop . Respiratory: Scattered rhonchi expansion is equal . Abdomen: soft non-distended . Skin: no rash seen on limited exam . Musculoskeletal:  no rigidity . Psychiatric: unable to assess . Neurologic: no involuntary movements          Lab Data and radiological Data:  Data has been reviewed   Assessment/Plan  Patient Active Problem List   Diagnosis Date Noted  . Anemia of chronic kidney failure, stage 3 (moderate) ([place]) 04/02/2020  . Acute gout 04/02/2020  . CHF (congestive heart failure) ([place]) 10/15/2018  . Acute on chronic diastolic CHF (congestive heart failure) ([place]) 10/13/2018  . Hypokalemia 06/08/2018  . Lymphedema 06/08/2018  . Closed displaced supracondylar fracture of distal end of left femur with intracondylar extension ([place]) 11/17/2017  . CKD (chronic kidney disease) 11/14/2017  . Hypothyroidism 11/14/2017  . Atrial fibrillation ([place]) 11/14/2017  . Anxiety state 11/14/2017  . Peripheral neuropathy 11/14/2017  . Bradycardia 08/13/2017  . Microcytic anemia 03/31/2017  . Iron deficiency anemia 03/31/2017  . Chronic diastolic heart failure ([place]) 03/10/2017   . HTN (hypertension) 03/10/2017  . Diabetes ([place]) 03/10/2017  . Obstructive sleep apnea 03/10/2017      1. Acute on chronic respiratory failure with hypoxia we will make attempts to wean on T-piece patient as tolerated. 2. Chronic kidney disease we will continue to monitor fluid status closely 3. Chronic atrial fibrillation rate controlled 4. Bradycardia no change continue with supportive care 5. Obstructive sleep apnea nonissue right now   I have personally evaluated the patient, evaluated the laboratory and imaging results and formulated the assessment and plan and placed orders as needed. The Patient requires high complexity decision making with multiple system involvement. Rounds were done with the Respiratory Therapy Director and respiratory therapist involved in the care of the patient as well as nursing staff.   Allyne Gee, MD [facility] Pulmonary Critical Care Medicine   This note is for inpatient care

## 2021-01-01 ENCOUNTER — Other Ambulatory Visit (HOSPITAL_COMMUNITY): Payer: Medicare Other | Admitting: Internal Medicine

## 2021-01-01 DIAGNOSIS — I4819 Other persistent atrial fibrillation: Secondary | ICD-10-CM | POA: Diagnosis not present

## 2021-01-01 DIAGNOSIS — I5032 Chronic diastolic (congestive) heart failure: Secondary | ICD-10-CM

## 2021-01-01 DIAGNOSIS — N183 Chronic kidney disease, stage 3 unspecified: Secondary | ICD-10-CM | POA: Diagnosis not present

## 2021-01-01 DIAGNOSIS — J9621 Acute and chronic respiratory failure with hypoxia: Secondary | ICD-10-CM

## 2021-01-01 NOTE — Progress Notes (Signed)
[place]  PROGRESS NOTE  PULMONARY SERVICE ROUNDS   JULANA CAMBRIDGE  DOB: 07-31-1956  Referring physician: Deanne Coffer, MD  HPI: WENDIE LAUGHREY is a 65 y.o. female  being seen for Acute on Chronic Respiratory Failure.  At this time patient is on T collar still has secretion issues going on  Review of Systems: Unremarkable other than noted in HPI  Allergies:  Reviewed on the [facility]  Medications: Reviewed  Vitals: Temperature is 98.9 pulse 68 respiratory 24 blood pressure is 134/76 saturations 97%  Ventilator Settings: On T collar with an FiO2 of 40%  Physical Exam: . General:  calm and comfortable NAD . Eyes: normal lids, irises & conjunctiva . ENT: grossly normal tongue not enlarged . Neck: no masses . Cardiovascular: S1 S2 Normal no rubs no gallop . Respiratory: No rhonchi very coarse breath sound . Abdomen: soft non-distended . Skin: no rash seen on limited exam . Musculoskeletal:  no rigidity . Psychiatric: unable to assess . Neurologic: no involuntary movements          Lab Data and radiological Data:  Labs have been reviewed   Assessment/Plan  Patient Active Problem List   Diagnosis Date Noted  . Anemia of chronic kidney failure, stage 3 (moderate) ([place]) 04/02/2020  . Acute gout 04/02/2020  . CHF (congestive heart failure) ([place]) 10/15/2018  . Acute on chronic diastolic CHF (congestive heart failure) ([place]) 10/13/2018  . Hypokalemia 06/08/2018  . Lymphedema 06/08/2018  . Closed displaced supracondylar fracture of distal end of left femur with intracondylar extension ([place]) 11/17/2017  . CKD (chronic kidney disease) 11/14/2017  . Hypothyroidism 11/14/2017  . Atrial fibrillation ([place]) 11/14/2017  . Anxiety state 11/14/2017  . Peripheral neuropathy 11/14/2017  . Bradycardia 08/13/2017  . Microcytic anemia 03/31/2017  . Iron deficiency anemia 03/31/2017  . Chronic diastolic heart failure ([place]) 03/10/2017  . HTN (hypertension)  03/10/2017  . Diabetes ([place]) 03/10/2017  . Obstructive sleep apnea 03/10/2017      1. Acute on chronic respiratory failure with hypoxia at this time patient has off the ventilator on T-piece requiring 40% FiO2.  We will continue to monitor closely. 2. Chronic kidney disease supportive care we will continue present manage 3. Chronic atrial fibrillation rate is controlled we will follow along. 4. Bradycardia overall no change rhythm has been stable 5. Obstructive sleep apnea baseline   I have personally evaluated the patient, evaluated the laboratory and imaging results and formulated the assessment and plan and placed orders as needed. The Patient requires high complexity decision making with multiple system involvement. Rounds were done with the Respiratory Therapy Director and respiratory therapist involved in the care of the patient as well as nursing staff.   Allyne Gee, MD [facility] Pulmonary Critical Care Medicine   This note is for inpatient care

## 2021-01-02 ENCOUNTER — Other Ambulatory Visit (HOSPITAL_COMMUNITY): Payer: Medicare Other | Admitting: Internal Medicine

## 2021-01-02 DIAGNOSIS — N183 Chronic kidney disease, stage 3 unspecified: Secondary | ICD-10-CM

## 2021-01-02 DIAGNOSIS — I5032 Chronic diastolic (congestive) heart failure: Secondary | ICD-10-CM

## 2021-01-02 DIAGNOSIS — J9621 Acute and chronic respiratory failure with hypoxia: Secondary | ICD-10-CM | POA: Diagnosis not present

## 2021-01-02 DIAGNOSIS — I4819 Other persistent atrial fibrillation: Secondary | ICD-10-CM | POA: Diagnosis not present

## 2021-01-02 DIAGNOSIS — I5033 Acute on chronic diastolic (congestive) heart failure: Secondary | ICD-10-CM

## 2021-01-03 NOTE — Progress Notes (Signed)
[place]  PROGRESS NOTE  PULMONARY SERVICE ROUNDS   NAKITTA WAFFLE  DOB: 1955-08-19  Referring physician: Deanne Coffer, MD  HPI: SHASTA BARBAREE is a 65 y.o. female  being seen for Acute on Chronic Respiratory Failure.  Patient at this time is on pressure control mode has been on 40% FiO2 PEEP 5 IP 16  Review of Systems: Unremarkable other than noted in HPI  Allergies:  Reviewed on the [facility]  Medications: Reviewed  Vitals: Temperature is 98.1 pulse 59 respiratory rate 16 blood pressure is 150/78 saturations 99  Ventilator Settings: Pressure assist control FiO2 40% tidal volume 459 PEEP 5 IP 16  Physical Exam: . General:  calm and comfortable NAD . Eyes: normal lids, irises & conjunctiva . ENT: grossly normal tongue not enlarged . Neck: no masses . Cardiovascular: S1 S2 Normal no rubs no gallop . Respiratory: No rhonchi very coarse breath sound . Abdomen: soft non-distended . Skin: no rash seen on limited exam . Musculoskeletal:  no rigidity . Psychiatric: unable to assess . Neurologic: no involuntary movements          Lab Data and radiological Data:  Data reviewed   Assessment/Plan  Patient Active Problem List   Diagnosis Date Noted  . Anemia of chronic kidney failure, stage 3 (moderate) ([place]) 04/02/2020  . Acute gout 04/02/2020  . CHF (congestive heart failure) ([place]) 10/15/2018  . Acute on chronic diastolic CHF (congestive heart failure) ([place]) 10/13/2018  . Hypokalemia 06/08/2018  . Lymphedema 06/08/2018  . Closed displaced supracondylar fracture of distal end of left femur with intracondylar extension ([place]) 11/17/2017  . CKD (chronic kidney disease) 11/14/2017  . Hypothyroidism 11/14/2017  . Atrial fibrillation ([place]) 11/14/2017  . Anxiety state 11/14/2017  . Peripheral neuropathy 11/14/2017  . Bradycardia 08/13/2017  . Microcytic anemia 03/31/2017  . Iron deficiency anemia 03/31/2017  . Chronic diastolic heart failure ([place])  03/10/2017  . HTN (hypertension) 03/10/2017  . Diabetes ([place]) 03/10/2017  . Obstructive sleep apnea 03/10/2017      1. Acute on chronic respiratory failure with hypoxia right now on full support we will continue to monitor closely. 2. Chronic kidney disease we will continue with supportive care. 3. Chronic atrial fibrillation rate is controlled 4. Bradycardia no change we will continue to monitor telemetry 5. Obstructive sleep apnea nonissue   I have personally evaluated the patient, evaluated the laboratory and imaging results and formulated the assessment and plan and placed orders as needed. The Patient requires high complexity decision making with multiple system involvement. Rounds were done with the Respiratory Therapy Director and respiratory therapist involved in the care of the patient as well as nursing staff.   Allyne Gee, MD [facility] Pulmonary Critical Care Medicine   This note is for inpatient care

## 2021-01-19 ENCOUNTER — Other Ambulatory Visit (HOSPITAL_COMMUNITY): Payer: Medicare Other | Admitting: Internal Medicine

## 2021-01-19 ENCOUNTER — Other Ambulatory Visit: Payer: Self-pay | Admitting: Family

## 2021-01-19 DIAGNOSIS — N183 Chronic kidney disease, stage 3 unspecified: Secondary | ICD-10-CM | POA: Diagnosis not present

## 2021-01-19 DIAGNOSIS — R001 Bradycardia, unspecified: Secondary | ICD-10-CM

## 2021-01-19 DIAGNOSIS — D631 Anemia in chronic kidney disease: Secondary | ICD-10-CM

## 2021-01-19 DIAGNOSIS — J9621 Acute and chronic respiratory failure with hypoxia: Secondary | ICD-10-CM

## 2021-01-19 DIAGNOSIS — I5033 Acute on chronic diastolic (congestive) heart failure: Secondary | ICD-10-CM

## 2021-01-19 DIAGNOSIS — I5032 Chronic diastolic (congestive) heart failure: Secondary | ICD-10-CM

## 2021-01-19 DIAGNOSIS — I4819 Other persistent atrial fibrillation: Secondary | ICD-10-CM

## 2021-01-20 ENCOUNTER — Other Ambulatory Visit (HOSPITAL_COMMUNITY): Payer: Medicare Other | Admitting: Internal Medicine

## 2021-01-20 DIAGNOSIS — R001 Bradycardia, unspecified: Secondary | ICD-10-CM

## 2021-01-20 DIAGNOSIS — I5033 Acute on chronic diastolic (congestive) heart failure: Secondary | ICD-10-CM

## 2021-01-20 DIAGNOSIS — J9621 Acute and chronic respiratory failure with hypoxia: Secondary | ICD-10-CM

## 2021-01-20 DIAGNOSIS — I4819 Other persistent atrial fibrillation: Secondary | ICD-10-CM | POA: Diagnosis not present

## 2021-01-20 DIAGNOSIS — I5032 Chronic diastolic (congestive) heart failure: Secondary | ICD-10-CM

## 2021-01-20 NOTE — Progress Notes (Signed)
[place]  PROGRESS NOTE  PULMONARY SERVICE ROUNDS   ELLAN DINNOCENZO  DOB: 08/15/1955  Referring physician: Deanne Coffer, MD  HPI: MELEANE DEOLIVEIRA is a 65 y.o. female  being seen for Acute on Chronic Respiratory Failure.  Patient is currently on T collar has been on 35% FiO2  Review of Systems: Unremarkable other than noted in HPI  Allergies:  Reviewed on the [facility]  Medications: Reviewed  Vitals: Temperature is 97 pulse 60 respiratory 20 blood pressure is 110/43 saturations 97%  Ventilator Settings: Off the ventilator right now on T collar  Physical Exam: General:  calm and comfortable NAD Eyes: normal lids, irises & conjunctiva ENT: grossly normal tongue not enlarged Neck: no masses Cardiovascular: S1 S2 Normal no rubs no gallop Respiratory: No rhonchi very coarse breath sounds Abdomen: soft non-distended Skin: no rash seen on limited exam Musculoskeletal:  no rigidity Psychiatric: unable to assess Neurologic: no involuntary movements          Lab Data and radiological Data:  Data reviewed   Assessment/Plan  Patient Active Problem List   Diagnosis Date Noted   Anemia of chronic kidney failure, stage 3 (moderate) (HCC) 04/02/2020   Acute gout 04/02/2020   CHF (congestive heart failure) ([place]) 10/15/2018   Acute on chronic diastolic CHF (congestive heart failure) ([place]) 10/13/2018   Hypokalemia 06/08/2018   Lymphedema 06/08/2018   Closed displaced supracondylar fracture of distal end of left femur with intracondylar extension ([place]) 11/17/2017   CKD (chronic kidney disease) 11/14/2017   Hypothyroidism 11/14/2017   Atrial fibrillation ([place]) 11/14/2017   Anxiety state 11/14/2017   Peripheral neuropathy 11/14/2017   Bradycardia 08/13/2017   Microcytic anemia 03/31/2017   Iron deficiency anemia 03/31/2017   Chronic diastolic heart failure ([place]) 03/10/2017   HTN (hypertension) 03/10/2017   Diabetes ([place]) 03/10/2017   Obstructive sleep  apnea 03/10/2017      Acute on chronic respiratory failure hypoxia doing fine with T-piece secretions are still significant continue pulmonary toilet Chronic kidney disease we will continue to monitor patient's lab work closely follow hydration status Chronic atrial fibrillation rate controlled Bradycardia no change at this time rhythm has been stable Obstructive sleep apnea nonissue with airway in place   I have personally evaluated the patient, evaluated the laboratory and imaging results and formulated the assessment and plan and placed orders as needed. The Patient requires high complexity decision making with multiple system involvement. Rounds were done with the Respiratory Therapy Director and respiratory therapist involved in the care of the patient as well as nursing staff.   Allyne Gee, MD [facility] Pulmonary Critical Care Medicine   This note is for inpatient care

## 2021-01-21 ENCOUNTER — Other Ambulatory Visit (HOSPITAL_COMMUNITY): Payer: Medicare Other | Admitting: Internal Medicine

## 2021-01-21 DIAGNOSIS — I4819 Other persistent atrial fibrillation: Secondary | ICD-10-CM | POA: Diagnosis not present

## 2021-01-21 DIAGNOSIS — I5033 Acute on chronic diastolic (congestive) heart failure: Secondary | ICD-10-CM | POA: Diagnosis not present

## 2021-01-21 DIAGNOSIS — G4733 Obstructive sleep apnea (adult) (pediatric): Secondary | ICD-10-CM

## 2021-01-21 DIAGNOSIS — I5032 Chronic diastolic (congestive) heart failure: Secondary | ICD-10-CM | POA: Diagnosis not present

## 2021-01-21 DIAGNOSIS — N183 Chronic kidney disease, stage 3 unspecified: Secondary | ICD-10-CM

## 2021-01-21 DIAGNOSIS — J9621 Acute and chronic respiratory failure with hypoxia: Secondary | ICD-10-CM | POA: Diagnosis not present

## 2021-01-21 NOTE — Progress Notes (Signed)
[place]  PROGRESS NOTE  PULMONARY SERVICE ROUNDS   Andrea Rivers  DOB: 1955-12-15  Referring physician: Deanne Coffer, MD  HPI: Andrea Rivers is a 65 y.o. female  being seen for Acute on Chronic Respiratory Failure.  Patient is currently on T collar the goal is to try to do 24 hours on weaning  Review of Systems: Unremarkable other than noted in HPI  Allergies:  Reviewed on the [facility]  Medications: Reviewed  Vitals: Temperature is 97.2 pulse 52 respiratory rate is 20 blood pressure is 104/56 saturations 99%  Ventilator Settings: Off the ventilator right now on T collar FiO2 28%  Physical Exam: General:  calm and comfortable NAD Eyes: normal lids, irises & conjunctiva ENT: grossly normal tongue not enlarged Neck: no masses Cardiovascular: S1 S2 Normal no rubs no gallop Respiratory: No rhonchi very coarse breath sounds Abdomen: soft non-distended Skin: no rash seen on limited exam Musculoskeletal:  no rigidity Psychiatric: unable to assess Neurologic: no involuntary movements          Lab Data and radiological Data:  Data reviewed   Assessment/Plan  Patient Active Problem List   Diagnosis Date Noted   Anemia of chronic kidney failure, stage 3 (moderate) (HCC) 04/02/2020   Acute gout 04/02/2020   CHF (congestive heart failure) ([place]) 10/15/2018   Acute on chronic diastolic CHF (congestive heart failure) ([place]) 10/13/2018   Hypokalemia 06/08/2018   Lymphedema 06/08/2018   Closed displaced supracondylar fracture of distal end of left femur with intracondylar extension ([place]) 11/17/2017   CKD (chronic kidney disease) 11/14/2017   Hypothyroidism 11/14/2017   Atrial fibrillation ([place]) 11/14/2017   Anxiety state 11/14/2017   Peripheral neuropathy 11/14/2017   Bradycardia 08/13/2017   Microcytic anemia 03/31/2017   Iron deficiency anemia 03/31/2017   Chronic diastolic heart failure ([place]) 03/10/2017   HTN (hypertension) 03/10/2017    Diabetes ([place]) 03/10/2017   Obstructive sleep apnea 03/10/2017      Acute on chronic respiratory failure hypoxia Re[place]nauer is off the ventilator on T collar the goal is for 24 hours we will continue to advance Chronic kidney disease supportive care we will continue to follow along closely. Chronic atrial fibrillation rate now rate is controlled we will continue with medical management Bradycardia no change we will continue with supportive care Obstructive sleep apnea [place] is nonissue   I have personally evaluated the patient, evaluated the laboratory and imaging results and formulated the assessment and plan and placed orders as needed. The Patient requires high complexity decision making with multiple system involvement. Rounds were done with the Respiratory Therapy Director and respiratory therapist involved in the care of the patient as well as nursing staff.   Allyne Gee, MD [facility] Pulmonary Critical Care Medicine   This note is for inpatient care

## 2021-01-22 ENCOUNTER — Other Ambulatory Visit (HOSPITAL_COMMUNITY): Payer: Medicare Other | Admitting: Internal Medicine

## 2021-01-22 DIAGNOSIS — R001 Bradycardia, unspecified: Secondary | ICD-10-CM

## 2021-01-22 DIAGNOSIS — I4819 Other persistent atrial fibrillation: Secondary | ICD-10-CM | POA: Diagnosis not present

## 2021-01-22 DIAGNOSIS — I5033 Acute on chronic diastolic (congestive) heart failure: Secondary | ICD-10-CM | POA: Diagnosis not present

## 2021-01-22 DIAGNOSIS — N183 Chronic kidney disease, stage 3 unspecified: Secondary | ICD-10-CM

## 2021-01-22 DIAGNOSIS — J9621 Acute and chronic respiratory failure with hypoxia: Secondary | ICD-10-CM

## 2021-01-22 NOTE — Progress Notes (Signed)
[place]  PROGRESS NOTE  PULMONARY SERVICE ROUNDS   Andrea Rivers  DOB: 05-Dec-1955  Referring physician: Deanne Coffer, MD  HPI: Andrea Rivers is a 65 y.o. female  being seen for Acute on Chronic Respiratory Failure.  Patient is back on the ventilator right now is on pressure control mode had some desaturations noted  Review of Systems: Unremarkable other than noted in HPI  Allergies:  Reviewed on the [facility]  Medications: Reviewed  Vitals: Temperature is 97.5 pulse 59 respiratory rate is 20 blood pressure is 143/87 saturations 99%  Ventilator Settings: On pressure assist control FiO2 is 40% PEEP 5 IP 16  Physical Exam: General:  calm and comfortable NAD Eyes: normal lids, irises & conjunctiva ENT: grossly normal tongue not enlarged Neck: no masses Cardiovascular: S1 S2 Normal no rubs no gallop Respiratory: No rhonchi very coarse percent Abdomen: soft non-distended Skin: no rash seen on limited exam Musculoskeletal:  no rigidity Psychiatric: unable to assess Neurologic: no involuntary movements          Lab Data and radiological Data:  Labs have been reviewed   Assessment/Plan  Patient Active Problem List   Diagnosis Date Noted   Anemia of chronic kidney failure, stage 3 (moderate) (HCC) 04/02/2020   Acute gout 04/02/2020   CHF (congestive heart failure) ([place]) 10/15/2018   Acute on chronic diastolic CHF (congestive heart failure) ([place]) 10/13/2018   Hypokalemia 06/08/2018   Lymphedema 06/08/2018   Closed displaced supracondylar fracture of distal end of left femur with intracondylar extension ([place]) 11/17/2017   CKD (chronic kidney disease) 11/14/2017   Hypothyroidism 11/14/2017   Atrial fibrillation ([place]) 11/14/2017   Anxiety state 11/14/2017   Peripheral neuropathy 11/14/2017   Bradycardia 08/13/2017   Microcytic anemia 03/31/2017   Iron deficiency anemia 03/31/2017   Chronic diastolic heart failure ([place]) 03/10/2017   HTN  (hypertension) 03/10/2017   Diabetes ([place]) 03/10/2017   Obstructive sleep apnea 03/10/2017      Acute on chronic respiratory failure with hypoxia patient is back on the ventilator after some desaturation issues overnight.  Respiratory therapy will reassess the RSB and mechanics titrate the oxygen down as tolerated. Chronic kidney disease we will continue with supportive care monitoring patient's lab work closely Chronic atrial fibrillation rate now rate controlled Bradycardia has been monitoring on telemetry Obstructive sleep apnea nonissue patient is back on the ventilator   I have personally evaluated the patient, evaluated the laboratory and imaging results and formulated the assessment and plan and placed orders as needed. The Patient requires high complexity decision making with multiple system involvement. Rounds were done with the Respiratory Therapy Director and respiratory therapist involved in the care of the patient as well as nursing staff.   Allyne Gee, MD [facility] Pulmonary Critical Care Medicine   This note is for inpatient care

## 2021-01-23 ENCOUNTER — Other Ambulatory Visit (HOSPITAL_COMMUNITY): Payer: Medicare Other | Admitting: Internal Medicine

## 2021-01-23 DIAGNOSIS — I4819 Other persistent atrial fibrillation: Secondary | ICD-10-CM | POA: Diagnosis not present

## 2021-01-23 DIAGNOSIS — I5032 Chronic diastolic (congestive) heart failure: Secondary | ICD-10-CM | POA: Diagnosis not present

## 2021-01-23 DIAGNOSIS — J9621 Acute and chronic respiratory failure with hypoxia: Secondary | ICD-10-CM | POA: Diagnosis not present

## 2021-01-23 DIAGNOSIS — R001 Bradycardia, unspecified: Secondary | ICD-10-CM

## 2021-01-24 NOTE — Progress Notes (Signed)
[place]  PROGRESS NOTE  PULMONARY SERVICE ROUNDS   STARLING ZEE  DOB: November 16, 1955  Referring physician: Deanne Coffer, MD  HPI: TEIRRA BEHNKEN is a 65 y.o. female  being seen for Acute on Chronic Respiratory Failure.  Patient's been on the T collar for more than 24 hours comfortable right now without distress  Review of Systems: Unremarkable other than noted in HPI  Allergies:  Reviewed on the [facility]  Medications: Reviewed  Vitals: Temperature is 96.9 pulse 70 respiratory 22 blood pressure is 136/70 saturations 97%  Ventilator Settings: T collar FiO2 35%  Physical Exam: General:  calm and comfortable NAD Eyes: normal lids, irises & conjunctiva ENT: grossly normal tongue not enlarged Neck: no masses Cardiovascular: S1 S2 Normal no rubs no gallop Respiratory: No rhonchi coarse breath sound Abdomen: soft non-distended Skin: no rash seen on limited exam Musculoskeletal:  no rigidity Psychiatric: unable to assess Neurologic: no involuntary movements          Lab Data and radiological Data:  Data reviewed   Assessment/Plan  Patient Active Problem List   Diagnosis Date Noted   Anemia of chronic kidney failure, stage 3 (moderate) (HCC) 04/02/2020   Acute gout 04/02/2020   CHF (congestive heart failure) ([place]) 10/15/2018   Acute on chronic diastolic CHF (congestive heart failure) ([place]) 10/13/2018   Hypokalemia 06/08/2018   Lymphedema 06/08/2018   Closed displaced supracondylar fracture of distal end of left femur with intracondylar extension ([place]) 11/17/2017   CKD (chronic kidney disease) 11/14/2017   Hypothyroidism 11/14/2017   Atrial fibrillation ([place]) 11/14/2017   Anxiety state 11/14/2017   Peripheral neuropathy 11/14/2017   Bradycardia 08/13/2017   Microcytic anemia 03/31/2017   Iron deficiency anemia 03/31/2017   Chronic diastolic heart failure ([place]) 03/10/2017   HTN (hypertension) 03/10/2017   Diabetes ([place]) 03/10/2017    Obstructive sleep apnea 03/10/2017      Acute on chronic respiratory failure hypoxia continue with the T collar right now on 35% FiO2 Chronic atrial fibrillation rate is controlled we will continue to monitor along closely. Bradycardia has been under control Chronic kidney disease following labs closely Obstructive sleep apnea nonissue right now    I have personally evaluated the patient, evaluated the laboratory and imaging results and formulated the assessment and plan and placed orders as needed. The Patient requires high complexity decision making with multiple system involvement. Rounds were done with the Respiratory Therapy Director and respiratory therapist involved in the care of the patient as well as nursing staff.   Allyne Gee, MD [facility] Pulmonary Critical Care Medicine   This note is for inpatient care

## 2021-01-24 NOTE — Progress Notes (Signed)
[place]  PROGRESS NOTE  PULMONARY SERVICE ROUNDS   Andrea Rivers  DOB: 11-Jul-1956  Referring physician: Deanne Coffer, MD  HPI: Andrea Rivers is a 65 y.o. female  being seen for Acute on Chronic Respiratory Failure.  Off the ventilator right now patient is on T collar should be able to try using PMV today  Review of Systems: Unremarkable other than noted in HPI  Allergies:  Reviewed on the [facility]  Medications: Reviewed  Vitals: Temperature is 98.9 pulse 57 respiratory 18 blood pressure is 138/82 saturations 100%  Ventilator Settings: Of the ventilator on T collar  Physical Exam: General:  calm and comfortable NAD Eyes: normal lids, irises & conjunctiva ENT: grossly normal tongue not enlarged Neck: no masses Cardiovascular: S1 S2 Normal no rubs no gallop Respiratory: No rhonchi very coarse breath sound Abdomen: soft non-distended Skin: no rash seen on limited exam Musculoskeletal:  no rigidity Psychiatric: unable to assess Neurologic: no involuntary movements          Lab Data and radiological Data:  Labs been reviewed   Assessment/Plan  Patient Active Problem List   Diagnosis Date Noted   Anemia of chronic kidney failure, stage 3 (moderate) (HCC) 04/02/2020   Acute gout 04/02/2020   CHF (congestive heart failure) ([place]) 10/15/2018   Acute on chronic diastolic CHF (congestive heart failure) ([place]) 10/13/2018   Hypokalemia 06/08/2018   Lymphedema 06/08/2018   Closed displaced supracondylar fracture of distal end of left femur with intracondylar extension ([place]) 11/17/2017   CKD (chronic kidney disease) 11/14/2017   Hypothyroidism 11/14/2017   Atrial fibrillation ([place]) 11/14/2017   Anxiety state 11/14/2017   Peripheral neuropathy 11/14/2017   Bradycardia 08/13/2017   Microcytic anemia 03/31/2017   Iron deficiency anemia 03/31/2017   Chronic diastolic heart failure ([place]) 03/10/2017   HTN (hypertension) 03/10/2017   Diabetes ([place])  03/10/2017   Obstructive sleep apnea 03/10/2017      Acute on chronic respiratory failure with hypoxia patient at this time is on T collar we will try to start using the PMV. Bradycardia rate has been under good control we will continue to monitor Chronic atrial fibrillation rate is controlled Chronic kidney disease following labs closely Obstructive sleep apnea nonissue at this time   I have personally evaluated the patient, evaluated the laboratory and imaging results and formulated the assessment and plan and placed orders as needed. The Patient requires high complexity decision making with multiple system involvement. Rounds were done with the Respiratory Therapy Director and respiratory therapist involved in the care of the patient as well as nursing staff.   Allyne Gee, MD [facility] Pulmonary Critical Care Medicine   This note is for inpatient care

## 2021-01-26 ENCOUNTER — Other Ambulatory Visit (HOSPITAL_COMMUNITY): Payer: Medicare Other | Admitting: Internal Medicine

## 2021-01-26 DIAGNOSIS — I5032 Chronic diastolic (congestive) heart failure: Secondary | ICD-10-CM | POA: Diagnosis not present

## 2021-01-26 DIAGNOSIS — I4819 Other persistent atrial fibrillation: Secondary | ICD-10-CM | POA: Diagnosis not present

## 2021-01-26 DIAGNOSIS — I5033 Acute on chronic diastolic (congestive) heart failure: Secondary | ICD-10-CM | POA: Diagnosis not present

## 2021-01-26 DIAGNOSIS — R001 Bradycardia, unspecified: Secondary | ICD-10-CM | POA: Diagnosis not present

## 2021-01-26 NOTE — Progress Notes (Signed)
[employer]  PROGRESS NOTE  PULMONARY SERVICE ROUNDS   Andrea Rivers  DOB: [birth year]  Referring physician: Deanne Coffer, MD  HPI: Andrea Rivers is a 65 y.o. female  being seen for Acute on Chronic Respiratory Failure.  Patient is comfortable right now without distress has been on the T collar is on 40% FiO2 right now  Review of Systems: Unremarkable other than noted in HPI  Allergies:  Reviewed on the [facility]  Medications: Reviewed  Vitals: Temperature is 96.9 pulse 66 respiratory rate is 18 blood pressure is 106/54 saturations 99%  Ventilator Settings: Patient is off the ventilator on T collar  Physical Exam: General:  calm and comfortable NAD Eyes: normal lids, irises & conjunctiva ENT: grossly normal tongue not enlarged Neck: no masses Cardiovascular: S1 S2 Normal no rubs no gallop Respiratory: No rhonchi Abdomen: soft non-distended Skin: no rash seen on limited exam Musculoskeletal:  no rigidity Psychiatric: unable to assess Neurologic: no involuntary movements          Lab Data and radiological Data:  Date has been reviewed   Assessment/Plan  Patient Active Problem List   Diagnosis Date Noted   Anemia of chronic kidney failure, stage 3 (moderate) (HCC) 04/02/2020   Acute gout 04/02/2020   CHF (congestive heart failure) ([place]) 10/15/2018   Acute on chronic diastolic CHF (congestive heart failure) (HCC) 10/13/2018   Hypokalemia 06/08/2018   Lymphedema 06/08/2018   Closed displaced supracondylar fracture of distal end of left femur with intracondylar extension ([place]) 11/17/2017   CKD (chronic kidney disease) 11/14/2017   Hypothyroidism 11/14/2017   Atrial fibrillation ([place]) 11/14/2017   Anxiety state 11/14/2017   Peripheral neuropathy 11/14/2017   Bradycardia 08/13/2017   Microcytic anemia 03/31/2017   Iron deficiency anemia 03/31/2017   Chronic diastolic heart failure ([place]) 03/10/2017   HTN (hypertension) 03/10/2017   Diabetes  ([place]) 03/10/2017   Obstructive sleep apnea 03/10/2017      Acute on chronic respiratory failure hypoxia patient will be changed over to a #6 cuffless trach and hopes of continuing to wean Bradycardia stable at this point no further episodes are noted Chronic atrial fibrillation rate is controlled we will continue to monitor closely. Chronic diastolic heart failure compensated we will continue with present therapy   I have personally evaluated the patient, evaluated the laboratory and imaging results and formulated the assessment and plan and placed orders as needed. The Patient requires high complexity decision making with multiple system involvement. Rounds were done with the Respiratory Therapy Director and respiratory therapist involved in the care of the patient as well as nursing staff.   Allyne Gee, MD [facility] Pulmonary Critical Care Medicine   This note is for inpatient care

## 2021-01-27 ENCOUNTER — Other Ambulatory Visit (HOSPITAL_COMMUNITY): Payer: Medicare Other | Admitting: Internal Medicine

## 2021-01-27 DIAGNOSIS — R001 Bradycardia, unspecified: Secondary | ICD-10-CM

## 2021-01-27 DIAGNOSIS — I4819 Other persistent atrial fibrillation: Secondary | ICD-10-CM

## 2021-01-27 DIAGNOSIS — J9621 Acute and chronic respiratory failure with hypoxia: Secondary | ICD-10-CM

## 2021-01-27 DIAGNOSIS — I5032 Chronic diastolic (congestive) heart failure: Secondary | ICD-10-CM | POA: Diagnosis not present

## 2021-01-27 DIAGNOSIS — G4733 Obstructive sleep apnea (adult) (pediatric): Secondary | ICD-10-CM

## 2021-01-28 ENCOUNTER — Other Ambulatory Visit (HOSPITAL_COMMUNITY): Payer: Medicare Other | Admitting: Internal Medicine

## 2021-01-28 DIAGNOSIS — I5033 Acute on chronic diastolic (congestive) heart failure: Secondary | ICD-10-CM

## 2021-01-28 DIAGNOSIS — I4819 Other persistent atrial fibrillation: Secondary | ICD-10-CM | POA: Diagnosis not present

## 2021-01-28 DIAGNOSIS — J9621 Acute and chronic respiratory failure with hypoxia: Secondary | ICD-10-CM | POA: Diagnosis not present

## 2021-01-28 DIAGNOSIS — I5032 Chronic diastolic (congestive) heart failure: Secondary | ICD-10-CM | POA: Diagnosis not present

## 2021-01-28 NOTE — Progress Notes (Signed)
[place]  PROGRESS NOTE  PULMONARY SERVICE ROUNDS   AYSE SPROUSE  DOB: 01-19-1956  Referring physician: Deanne Coffer, MD  HPI: CALLOWAY SABELLA is a 65 y.o. female  being seen for Acute on Chronic Respiratory Failure.  Patient is afebrile comfortable right now without distress remains on pressure support  Review of Systems: Unremarkable other than noted in HPI  Allergies:  Reviewed on the [facility]  Medications: Reviewed  Vitals: Temperature is 96.6 pulse 51 respiratory rate is 18 blood pressure 100/54 saturations 96%  Ventilator Settings: Currently on pressure support FiO2 45%  Physical Exam: General:  calm and comfortable NAD Eyes: normal lids, irises & conjunctiva ENT: grossly normal tongue not enlarged Neck: no masses Cardiovascular: S1 S2 Normal no rubs no gallop Respiratory: Scattered rhonchi expansion is equal Abdomen: soft non-distended Skin: no rash seen on limited exam Musculoskeletal:  no rigidity Psychiatric: unable to assess Neurologic: no involuntary movements          Lab Data and radiological Data:  Labs have been reviewed   Assessment/Plan  Patient Active Problem List   Diagnosis Date Noted   Anemia of chronic kidney failure, stage 3 (moderate) (HCC) 04/02/2020   Acute gout 04/02/2020   CHF (congestive heart failure) ([place]) 10/15/2018   Acute on chronic diastolic CHF (congestive heart failure) ([place]) 10/13/2018   Hypokalemia 06/08/2018   Lymphedema 06/08/2018   Closed displaced supracondylar fracture of distal end of left femur with intracondylar extension ([place]) 11/17/2017   CKD (chronic kidney disease) 11/14/2017   Hypothyroidism 11/14/2017   Atrial fibrillation ([place]) 11/14/2017   Anxiety state 11/14/2017   Peripheral neuropathy 11/14/2017   Bradycardia 08/13/2017   Microcytic anemia 03/31/2017   Iron deficiency anemia 03/31/2017   Chronic diastolic heart failure ([place]) 03/10/2017   HTN (hypertension) 03/10/2017    Diabetes ([place]) 03/10/2017   Obstructive sleep apnea 03/10/2017      Acute on chronic respiratory failure hypoxia we will continue with pressure support currently is on 12/5.  We will continue to try to titrate as tolerated.  Patient's been on 45% FiO2 Chronic diastolic heart failure compensated continue supportive care diuretics as tolerated Chronic atrial fibrillation rate is controlled at this time we will continue to follow along closely Bradycardia seems to be at baseline we will monitor   I have personally evaluated the patient, evaluated the laboratory and imaging results and formulated the assessment and plan and placed orders as needed. The Patient requires high complexity decision making with multiple system involvement. Rounds were done with the Respiratory Therapy Director and respiratory therapist involved in the care of the patient as well as nursing staff.   Allyne Gee, MD [facility] Pulmonary Critical Care Medicine   This note is for inpatient care

## 2021-01-28 NOTE — Progress Notes (Signed)
[place]  PROGRESS NOTE  PULMONARY SERVICE ROUNDS   Andrea Rivers  DOB: Jul 30, 1956  Referring physician: Deanne Coffer, MD  HPI: Andrea Rivers is a 65 y.o. female  being seen for Acute on Chronic Respiratory Failure.  Patient is comfortable right now without distress remains on the ventilator and full support has been on pressure assist control mode at this time.  Review of Systems: Unremarkable other than noted in HPI  Allergies:  Reviewed on the [facility]  Medications: Reviewed  Vitals: The temperature 96.4 pulse 60 respiratory 22 blood pressure is 100/56 saturations 92%  Ventilator Settings: Pressure assist control FiO2 45% tidal volume 380 IP 18 PEEP 6  Physical Exam: General:  calm and comfortable NAD Eyes: normal lids, irises & conjunctiva ENT: grossly normal tongue not enlarged Neck: no masses Cardiovascular: S1 S2 Normal no rubs no gallop Respiratory: No rhonchi very coarse breath sounds Abdomen: soft non-distended Skin: no rash seen on limited exam Musculoskeletal:  no rigidity Psychiatric: unable to assess Neurologic: no involuntary movements          Lab Data and radiological Data:  Labs have been reviewed   Assessment/Plan  Patient Active Problem List   Diagnosis Date Noted   Anemia of chronic kidney failure, stage 3 (moderate) (HCC) 04/02/2020   Acute gout 04/02/2020   CHF (congestive heart failure) ([place]) 10/15/2018   Acute on chronic diastolic CHF (congestive heart failure) ([place]) 10/13/2018   Hypokalemia 06/08/2018   Lymphedema 06/08/2018   Closed displaced supracondylar fracture of distal end of left femur with intracondylar extension ([place]) 11/17/2017   CKD (chronic kidney disease) 11/14/2017   Hypothyroidism 11/14/2017   Atrial fibrillation ([place]) 11/14/2017   Anxiety state 11/14/2017   Peripheral neuropathy 11/14/2017   Bradycardia 08/13/2017   Microcytic anemia 03/31/2017   Iron deficiency anemia 03/31/2017    Chronic diastolic heart failure ([place]) 03/10/2017   HTN (hypertension) 03/10/2017   Diabetes ([place]) 03/10/2017   Obstructive sleep apnea 03/10/2017      Acute on chronic respiratory failure with hypoxia patient at this time is going to continue with pressure assist control mode has been on 45% FiO2.  Respiratory therapy is going to reassess TRS.  Mechanics. Bradycardia no further events of bradycardia are noted Chronic diastolic heart failure compensated monitor fluid status Obstructive sleep apnea nonissue patient is on positive airway pressure Chronic atrial fibrillation rate is controlled we will continue to monitor closely.   I have personally evaluated the patient, evaluated the laboratory and imaging results and formulated the assessment and plan and placed orders as needed. The Patient requires high complexity decision making with multiple system involvement. Rounds were done with the Respiratory Therapy Director and respiratory therapist involved in the care of the patient as well as nursing staff.   Allyne Gee, MD [facility] Pulmonary Critical Care Medicine   This note is for inpatient care

## 2021-01-29 ENCOUNTER — Other Ambulatory Visit (HOSPITAL_COMMUNITY): Payer: Medicare Other | Admitting: Internal Medicine

## 2021-01-29 DIAGNOSIS — I4819 Other persistent atrial fibrillation: Secondary | ICD-10-CM

## 2021-01-29 DIAGNOSIS — I5032 Chronic diastolic (congestive) heart failure: Secondary | ICD-10-CM

## 2021-01-29 DIAGNOSIS — R001 Bradycardia, unspecified: Secondary | ICD-10-CM | POA: Diagnosis not present

## 2021-01-29 DIAGNOSIS — I5033 Acute on chronic diastolic (congestive) heart failure: Secondary | ICD-10-CM

## 2021-01-29 DIAGNOSIS — F411 Generalized anxiety disorder: Secondary | ICD-10-CM

## 2021-01-31 NOTE — Progress Notes (Signed)
[place]  PROGRESS NOTE  PULMONARY SERVICE ROUNDS   Andrea Rivers  DOB: 21-Nov-1955  Referring physician: Deanne Coffer, MD  HPI: Andrea Rivers is a 65 y.o. female  being seen for Acute on Chronic Respiratory Failure.  Patient is currently on pressure support has been on 40% FiO2 with good saturation  Review of Systems: Unremarkable other than noted in HPI  Allergies:  Reviewed on the [facility]  Medications: Reviewed  Vitals: Temperature is 97.9 pulse 60 respiratory rate is 29 blood pressure is 138/60 saturations 95%  Ventilator Settings: Pressure support FiO2 40% pressure 8/8  Physical Exam: General:  calm and comfortable NAD Eyes: normal lids, irises & conjunctiva ENT: grossly normal tongue not enlarged Neck: no masses Cardiovascular: S1 S2 Normal no rubs no gallop Respiratory: Scattered rhonchi expansion is equal at this time Abdomen: soft non-distended Skin: no rash seen on limited exam Musculoskeletal:  no rigidity Psychiatric: unable to assess Neurologic: no involuntary movements          Lab Data and radiological Data:  Labs been reviewed   Assessment/Plan  Patient Active Problem List   Diagnosis Date Noted   Anemia of chronic kidney failure, stage 3 (moderate) (HCC) 04/02/2020   Acute gout 04/02/2020   CHF (congestive heart failure) ([place]) 10/15/2018   Acute on chronic diastolic CHF (congestive heart failure) ([place]) 10/13/2018   Hypokalemia 06/08/2018   Lymphedema 06/08/2018   Closed displaced supracondylar fracture of distal end of left femur with intracondylar extension ([place]) 11/17/2017   CKD (chronic kidney disease) 11/14/2017   Hypothyroidism 11/14/2017   Atrial fibrillation ([place]) 11/14/2017   Anxiety state 11/14/2017   Peripheral neuropathy 11/14/2017   Bradycardia 08/13/2017   Microcytic anemia 03/31/2017   Iron deficiency anemia 03/31/2017   Chronic diastolic heart failure ([place]) 03/10/2017   HTN (hypertension)  03/10/2017   Diabetes ([place]) 03/10/2017   Obstructive sleep apnea 03/10/2017      Acute on chronic respiratory failure hypoxia plan is going to be to continue with pressure support patient looks good today we will go ahead and wean further to T-bar Atrial fibrillation rate is controlled we will continue to monitor closely. Bradycardia rate is controlled. Chronic kidney disease following up on labs Chronic diastolic heart failure appears to be compensated   I have personally evaluated the patient, evaluated the laboratory and imaging results and formulated the assessment and plan and placed orders as needed. The Patient requires high complexity decision making with multiple system involvement. Rounds were done with the Respiratory Therapy Director and respiratory therapist involved in the care of the patient as well as nursing staff.   Allyne Gee, MD [facility] Pulmonary Critical Care Medicine   This note is for inpatient care

## 2021-02-03 ENCOUNTER — Other Ambulatory Visit (HOSPITAL_COMMUNITY): Payer: Medicare Other | Admitting: Internal Medicine

## 2021-02-03 DIAGNOSIS — I5032 Chronic diastolic (congestive) heart failure: Secondary | ICD-10-CM

## 2021-02-03 DIAGNOSIS — R001 Bradycardia, unspecified: Secondary | ICD-10-CM | POA: Diagnosis not present

## 2021-02-03 DIAGNOSIS — I5033 Acute on chronic diastolic (congestive) heart failure: Secondary | ICD-10-CM | POA: Diagnosis not present

## 2021-02-03 DIAGNOSIS — I4819 Other persistent atrial fibrillation: Secondary | ICD-10-CM

## 2021-02-03 DIAGNOSIS — J9621 Acute and chronic respiratory failure with hypoxia: Secondary | ICD-10-CM | POA: Diagnosis not present

## 2021-02-03 DIAGNOSIS — N183 Chronic kidney disease, stage 3 unspecified: Secondary | ICD-10-CM

## 2021-02-04 ENCOUNTER — Other Ambulatory Visit (HOSPITAL_COMMUNITY): Payer: Medicare Other | Admitting: Internal Medicine

## 2021-02-04 DIAGNOSIS — I4819 Other persistent atrial fibrillation: Secondary | ICD-10-CM | POA: Diagnosis not present

## 2021-02-04 DIAGNOSIS — I5033 Acute on chronic diastolic (congestive) heart failure: Secondary | ICD-10-CM

## 2021-02-04 NOTE — Progress Notes (Signed)
[place]  PROGRESS NOTE  PULMONARY SERVICE ROUNDS   JOZLIN SCHUTH  DOB: 11/05/1955  Referring physician: Deanne Coffer, MD  HPI: MILBREY SHAGENA is a 65 y.o. female  being seen for Acute on Chronic Respiratory Failure.  Patient is comfortable right now without distress has been on T collar  Review of Systems: Unremarkable other than noted in HPI  Allergies:  Reviewed on the [facility]  Medications: Reviewed  Vitals: Temperature is 98.0 pulse 54 respiratory 18 blood pressure is 106/62 saturations 98%  Ventilator Settings: On T collar right now on 28% FiO2  Physical Exam: General:  calm and comfortable NAD Eyes: normal lids, irises & conjunctiva ENT: grossly normal tongue not enlarged Neck: no masses Cardiovascular: S1 S2 Normal no rubs no gallop Respiratory: No rhonchi very coarse breath sound Abdomen: soft non-distended Skin: no rash seen on limited exam Musculoskeletal:  no rigidity Psychiatric: unable to assess Neurologic: no involuntary movements          Lab Data and radiological Data:  Labs have been reviewed   Assessment/Plan  Patient Active Problem List   Diagnosis Date Noted   Anemia of chronic kidney failure, stage 3 (moderate) (HCC) 04/02/2020   Acute gout 04/02/2020   CHF (congestive heart failure) ([place]) 10/15/2018   Acute on chronic diastolic CHF (congestive heart failure) ([place]) 10/13/2018   Hypokalemia 06/08/2018   Lymphedema 06/08/2018   Closed displaced supracondylar fracture of distal end of left femur with intracondylar extension ([place]) 11/17/2017   CKD (chronic kidney disease) 11/14/2017   Hypothyroidism 11/14/2017   Atrial fibrillation ([place]) 11/14/2017   Anxiety state 11/14/2017   Peripheral neuropathy 11/14/2017   Bradycardia 08/13/2017   Microcytic anemia 03/31/2017   Iron deficiency anemia 03/31/2017   Chronic diastolic heart failure ([place]) 03/10/2017   HTN (hypertension) 03/10/2017   Diabetes ([place]) 03/10/2017    Obstructive sleep apnea 03/10/2017      Acute on chronic respiratory failure hypoxia doing well with the T collar and PMV we will try with capping again today Chronic diastolic heart failure appears to be compensated we will continue with supportive care Chronic atrial fibrillation rate is controlled at this time Chronic kidney disease monitoring lab work closely   I have personally evaluated the patient, evaluated the laboratory and imaging results and formulated the assessment and plan and placed orders as needed. The Patient requires high complexity decision making with multiple system involvement. Rounds were done with the Respiratory Therapy Director and respiratory therapist involved in the care of the patient as well as nursing staff.   Allyne Gee, MD [facility] Pulmonary Critical Care Medicine   This note is for inpatient care

## 2021-02-06 ENCOUNTER — Other Ambulatory Visit (HOSPITAL_COMMUNITY): Payer: Medicare Other | Admitting: Internal Medicine

## 2021-02-06 DIAGNOSIS — J9621 Acute and chronic respiratory failure with hypoxia: Secondary | ICD-10-CM | POA: Diagnosis not present

## 2021-02-06 DIAGNOSIS — I4819 Other persistent atrial fibrillation: Secondary | ICD-10-CM | POA: Diagnosis not present

## 2021-02-06 DIAGNOSIS — I5033 Acute on chronic diastolic (congestive) heart failure: Secondary | ICD-10-CM | POA: Diagnosis not present

## 2021-02-06 DIAGNOSIS — N183 Chronic kidney disease, stage 3 unspecified: Secondary | ICD-10-CM | POA: Diagnosis not present

## 2021-02-06 NOTE — Progress Notes (Signed)
[place]  PROGRESS NOTE  PULMONARY SERVICE ROUNDS   Andrea Rivers  DOB: Aug 26, 1955  Referring physician: Deanne Coffer, MD  HPI: Andrea Rivers is a 65 y.o. female  being seen for Acute on Chronic Respiratory Failure.  Patient is on T collar [place] should be able to start with capping trial  Review of Systems: Unremarkable other than noted in HPI  Allergies:  Reviewed on the [facility]  Medications: Reviewed  Vitals: Temperature 96.0 pulse 67 respiratory rate is 18 blood pressure is 119/59 saturations 95  Ventilator Settings: On T collar FiO2 28% using PMV  Physical Exam: General:  calm and comfortable NAD Eyes: normal lids, irises & conjunctiva ENT: grossly normal tongue not enlarged Neck: no masses Cardiovascular: S1 S2 Normal no rubs no gallop Respiratory: No rhonchi very coarse breath sound Abdomen: soft non-distended Skin: no rash seen on limited exam Musculoskeletal:  no rigidity Psychiatric: unable to assess Neurologic: no involuntary movements          Lab Data and radiological Data:  Labs have been reviewed   Assessment/Plan  Patient Active Problem List   Diagnosis Date Noted   Anemia of chronic kidney failure, stage 3 (moderate) (HCC) 04/02/2020   Acute gout 04/02/2020   CHF (congestive heart failure) ([place]) 10/15/2018   Acute on chronic diastolic CHF (congestive heart failure) ([place]) 10/13/2018   Hypokalemia 06/08/2018   Lymphedema 06/08/2018   Closed displaced supracondylar fracture of distal end of left femur with intracondylar extension ([place]) 11/17/2017   CKD (chronic kidney disease) 11/14/2017   Hypothyroidism 11/14/2017   Atrial fibrillation ([place]) 11/14/2017   Anxiety state 11/14/2017   Peripheral neuropathy 11/14/2017   Bradycardia 08/13/2017   Microcytic anemia 03/31/2017   Iron deficiency anemia 03/31/2017   Chronic diastolic heart failure ([place]) 03/10/2017   HTN (hypertension) 03/10/2017   Diabetes ([place])  03/10/2017   Obstructive sleep apnea 03/10/2017      Acute on chronic respiratory failure hypoxia we will continue with T collar trials patient is on 28% FiO2 using PMV.  Spoke with respiratory therapy during rounds and we should be able to try to advance to capping after the tracheostomy is changed Chronic kidney disease at baseline we will continue to monitor closely. Chronic atrial fibrillation rate is controlled we will continue with supportive care Chronic diastolic heart failure compensated   I have personally evaluated the patient, evaluated the laboratory and imaging results and formulated the assessment and plan and placed orders as needed. The Patient requires high complexity decision making with multiple system involvement. Rounds were done with the Respiratory Therapy Director and respiratory therapist involved in the care of the patient as well as nursing staff.   Allyne Gee, MD [facility] Pulmonary Critical Care Medicine   This note is for inpatient care

## 2021-02-09 NOTE — Progress Notes (Signed)
[place]  PROGRESS NOTE  PULMONARY SERVICE ROUNDS   Andrea Rivers  DOB: [birth year]  Referring physician: Deanne Coffer, MD  HPI: Andrea Rivers is a 65 y.o. female  being seen for Acute on Chronic Respiratory Failure.  Patient is comfortable right now without distress is on T collar has been on 35% FiO2 using PMV  Review of Systems: Unremarkable other than noted in HPI  Allergies:  Reviewed on the [facility]  Medications: Reviewed  Vitals: Temperature is 98.8 pulse 65 respiratory 21 saturations 93%  Ventilator Settings: On T collar FiO2 35%  Physical Exam: General:  calm and comfortable NAD Eyes: normal lids, irises & conjunctiva ENT: grossly normal tongue not enlarged Neck: no masses Cardiovascular: S1 S2 Normal no rubs no gallop Respiratory: No rhonchi coarse breath sounds Abdomen: soft non-distended Skin: no rash seen on limited exam Musculoskeletal:  no rigidity Psychiatric: unable to assess Neurologic: no involuntary movements          Lab Data and radiological Data:  Labs have been reviewed   Assessment/Plan  Patient Active Problem List   Diagnosis Date Noted   Anemia of chronic kidney failure, stage 3 (moderate) (HCC) 04/02/2020   Acute gout 04/02/2020   CHF (congestive heart failure) ([place]) 10/15/2018   Acute on chronic diastolic CHF (congestive heart failure) ([place]) 10/13/2018   Hypokalemia 06/08/2018   Lymphedema 06/08/2018   Closed displaced supracondylar fracture of distal end of left femur with intracondylar extension ([place]) 11/17/2017   CKD (chronic kidney disease) 11/14/2017   Hypothyroidism 11/14/2017   Atrial fibrillation ([place]) 11/14/2017   Anxiety state 11/14/2017   Peripheral neuropathy 11/14/2017   Bradycardia 08/13/2017   Microcytic anemia 03/31/2017   Iron deficiency anemia 03/31/2017   Chronic diastolic heart failure ([place]) 03/10/2017   HTN (hypertension) 03/10/2017   Diabetes ([place]) 03/10/2017   Obstructive  sleep apnea 03/10/2017      Acute on chronic respiratory failure with hypoxia continues to do okay with the T collar plan is going to be advanced as tolerated we will continue with secretion management supportive care. Chronic atrial fibrillation rate controlled at this time we will continue to monitor. Chronic diastolic dysfunction supportive care monitor fluid status diuresis as tolerated Chronic kidney disease stage III supportive care again monitor fluid status closely   I have personally evaluated the patient, evaluated the laboratory and imaging results and formulated the assessment and plan and placed orders as needed. The Patient requires high complexity decision making with multiple system involvement. Rounds were done with the Respiratory Therapy Director and respiratory therapist involved in the care of the patient as well as nursing staff.   Allyne Gee, MD [facility] Pulmonary Critical Care Medicine   This note is for inpatient care

## 2021-02-10 ENCOUNTER — Other Ambulatory Visit (HOSPITAL_COMMUNITY): Payer: Medicare Other | Admitting: Internal Medicine

## 2021-02-10 DIAGNOSIS — I5033 Acute on chronic diastolic (congestive) heart failure: Secondary | ICD-10-CM

## 2021-02-10 DIAGNOSIS — I4819 Other persistent atrial fibrillation: Secondary | ICD-10-CM

## 2021-02-10 DIAGNOSIS — G4733 Obstructive sleep apnea (adult) (pediatric): Secondary | ICD-10-CM

## 2021-02-10 DIAGNOSIS — I5032 Chronic diastolic (congestive) heart failure: Secondary | ICD-10-CM

## 2021-02-10 DIAGNOSIS — N183 Chronic kidney disease, stage 3 unspecified: Secondary | ICD-10-CM

## 2021-02-10 NOTE — Progress Notes (Signed)
[place]  PROGRESS NOTE  PULMONARY SERVICE ROUNDS   Andrea Rivers  DOB: 1956-05-10  Referring physician: Deanne Coffer, MD  HPI: Andrea Rivers is a 65 y.o. female  being seen for Acute on Chronic Respiratory Failure.  She is off the ventilator on T collar using PMV.  I spoke with her regarding use of CPAP she states she had a CPAP machine but does not use it because of inability to keep it clean.  I spoke to her regarding alcohol abuse shows we will go ahead and start using it now she is willing to go ahead and start with the Pap therapy while here  Review of Systems: Unremarkable other than noted in HPI  Allergies:  Reviewed on the [facility]  Medications: Reviewed  Vitals: Temperature is 97.4 pulse 66 respiratory 18 blood pressure 142/70 saturations 100%  Ventilator Settings: On T collar FiO2 40%  Physical Exam: General:  calm and comfortable NAD Eyes: normal lids, irises & conjunctiva ENT: grossly normal tongue not enlarged Neck: no masses Cardiovascular: S1 S2 Normal no rubs no gallop Respiratory: No rhonchi no rales Abdomen: soft non-distended Skin: no rash seen on limited exam Musculoskeletal:  no rigidity Psychiatric: unable to assess Neurologic: no involuntary movements          Lab Data and radiological Data:  Labs have been reviewed   Assessment/Plan  Patient Active Problem List   Diagnosis Date Noted   Anemia of chronic kidney failure, stage 3 (moderate) (HCC) 04/02/2020   Acute gout 04/02/2020   CHF (congestive heart failure) ([place]) 10/15/2018   Acute on chronic diastolic CHF (congestive heart failure) ([place]) 10/13/2018   Hypokalemia 06/08/2018   Lymphedema 06/08/2018   Closed displaced supracondylar fracture of distal end of left femur with intracondylar extension ([place]) 11/17/2017   CKD (chronic kidney disease) 11/14/2017   Hypothyroidism 11/14/2017   Atrial fibrillation ([place]) 11/14/2017   Anxiety state 11/14/2017    Peripheral neuropathy 11/14/2017   Bradycardia 08/13/2017   Microcytic anemia 03/31/2017   Iron deficiency anemia 03/31/2017   Chronic diastolic heart failure ([place]) 03/10/2017   HTN (hypertension) 03/10/2017   Diabetes ([place]) 03/10/2017   Obstructive sleep apnea 03/10/2017      Acute on chronic respiratory failure hypoxia she is off the ventilator using PMV we will continue to try to advance Obstructive sleep apnea we will go ahead and start with using the CPAP BiPAP at nighttime and do capping trials Chronic kidney disease supportive care we will continue to monitor Chronic diastolic heart failure compensated   I have personally evaluated the patient, evaluated the laboratory and imaging results and formulated the assessment and plan and placed orders as needed. The Patient requires high complexity decision making with multiple system involvement. Rounds were done with the Respiratory Therapy Director and respiratory therapist involved in the care of the patient as well as nursing staff.   Allyne Gee, MD [facility] Pulmonary Critical Care Medicine   This note is for inpatient care

## 2021-02-11 ENCOUNTER — Other Ambulatory Visit (HOSPITAL_COMMUNITY): Payer: Medicare Other | Admitting: Internal Medicine

## 2021-02-11 DIAGNOSIS — N183 Chronic kidney disease, stage 3 unspecified: Secondary | ICD-10-CM

## 2021-02-11 DIAGNOSIS — I5033 Acute on chronic diastolic (congestive) heart failure: Secondary | ICD-10-CM | POA: Diagnosis not present

## 2021-02-11 DIAGNOSIS — I4819 Other persistent atrial fibrillation: Secondary | ICD-10-CM | POA: Diagnosis not present

## 2021-02-11 DIAGNOSIS — J9621 Acute and chronic respiratory failure with hypoxia: Secondary | ICD-10-CM | POA: Diagnosis not present

## 2021-02-11 DIAGNOSIS — I5032 Chronic diastolic (congestive) heart failure: Secondary | ICD-10-CM | POA: Diagnosis not present

## 2021-02-11 NOTE — Progress Notes (Signed)
[place]  PROGRESS NOTE  PULMONARY SERVICE ROUNDS   Andrea Rivers  DOB: Apr 10, 1956  Referring physician: Deanne Coffer, MD  HPI: Andrea Rivers is a 65 y.o. female  being seen for Acute on Chronic Respiratory Failure.  Patient currently is on T collar reviewed the chest x-ray shows an increasing pneumonia on the right side.  Possibly more dense  Review of Systems: Unremarkable other than noted in HPI  Allergies:  Reviewed on the [facility]  Medications: Reviewed  Vitals: Temperature 97.8 pulse 67 respiratory rate 17 blood pressure is 130/80 saturations 91%  Ventilator Settings: Patient currently is on T collar using the PMV  Physical Exam: General:  calm and comfortable NAD Eyes: normal lids, irises & conjunctiva ENT: grossly normal tongue not enlarged Neck: no masses Cardiovascular: S1 S2 Normal no rubs no gallop Respiratory: Scattered rhonchi expansion is equal Abdomen: soft non-distended Skin: no rash seen on limited exam Musculoskeletal:  no rigidity Psychiatric: unable to assess Neurologic: no involuntary movements          Lab Data and radiological Data:  Labs have been reviewed   EXAM:  Portable chest   HISTORY: Shortness of breath   COMPARISON: 01/27/2021   TECHNIQUE: Portable AP chest radiograph is obtained   FINDINGS: Tracheostomy tube is in stable position. There are decreased lung  volumes and possibly mild pleural effusions. Heterogeneous alveolar  opacities are seen throughout most of the lungs, without significant  interval change. Stable cardiomediastinal silhouette. Posterior spinal  fusion hardware in the thoracic spine.   IMPRESSION:  1. Chest radiograph is similar to the prior exam. Diffuse heterogeneous  alveolar opacities within most of the lungs with probably mild pleural  effusions.   Electronically Signed by:  Laurena Bering, MD, [facility]  Electronically Signed on:  02/11/2021 8:16  AM  Assessment/Plan  Patient Active Problem List   Diagnosis Date Noted   Anemia of chronic kidney failure, stage 3 (moderate) ([place]) 04/02/2020   Acute gout 04/02/2020   CHF (congestive heart failure) ([place]) 10/15/2018   Acute on chronic diastolic CHF (congestive heart failure) ([place]) 10/13/2018   Hypokalemia 06/08/2018   Lymphedema 06/08/2018   Closed displaced supracondylar fracture of distal end of left femur with intracondylar extension ([place]) 11/17/2017   CKD (chronic kidney disease) 11/14/2017   Hypothyroidism 11/14/2017   Atrial fibrillation ([place]) 11/14/2017   Anxiety state 11/14/2017   Peripheral neuropathy 11/14/2017   Bradycardia 08/13/2017   Microcytic anemia 03/31/2017   Iron deficiency anemia 03/31/2017   Chronic diastolic heart failure ([place]) 03/10/2017   HTN (hypertension) 03/10/2017   Diabetes ([place]) 03/10/2017   Obstructive sleep apnea 03/10/2017      Acute on chronic respiratory failure with hypoxia patient has worsening of pulmonary findings.  Also did desaturate last night likely due to infiltrates that are noted on the chest films.  Believe that there may be pneumonitis developing at this stage. Obstructive sleep apnea encourage BiPAP use at nighttime as previously discussed Chronic atrial fibrillation rate now rate is controlled we will continue with supportive care Chronic kidney disease monitoring patient's lab work closely   I have personally evaluated the patient, evaluated the laboratory and imaging results and formulated the assessment and plan and placed orders as needed. The Patient requires high complexity decision making with multiple system involvement. Rounds were done with the Respiratory Therapy Director and respiratory therapist involved in the care of the patient as well as nursing staff.   Allyne Gee, MD [facility]  Pulmonary Critical Care Medicine   This note is for inpatient care

## 2021-02-12 ENCOUNTER — Other Ambulatory Visit (HOSPITAL_COMMUNITY): Payer: Medicare Other | Admitting: Internal Medicine

## 2021-02-12 DIAGNOSIS — I4819 Other persistent atrial fibrillation: Secondary | ICD-10-CM | POA: Diagnosis not present

## 2021-02-12 DIAGNOSIS — J9621 Acute and chronic respiratory failure with hypoxia: Secondary | ICD-10-CM

## 2021-02-12 DIAGNOSIS — I5033 Acute on chronic diastolic (congestive) heart failure: Secondary | ICD-10-CM | POA: Diagnosis not present

## 2021-02-12 DIAGNOSIS — G4733 Obstructive sleep apnea (adult) (pediatric): Secondary | ICD-10-CM | POA: Diagnosis not present

## 2021-02-12 NOTE — Progress Notes (Signed)
[place]  PROGRESS NOTE  PULMONARY SERVICE ROUNDS   JAUNICE KUNZE  DOB: May 15, 1956  Referring physician: Deanne Coffer, MD  HPI: CARMIN ORTA is a 65 y.o. female  being seen for Acute on Chronic Respiratory Failure.  Patient currently is on T collar has been on 60% FiO2 with excellent saturations  Review of Systems: Unremarkable other than noted in HPI  Allergies:  Reviewed on the [facility]  Medications: Reviewed  Vitals: Temperature is 97.2 pulse 64 respiratory 20 blood pressure is 132/70 saturations 95%  Ventilator Settings: Off the ventilator on T collar currently on 60% FiO2  Physical Exam: General:  calm and comfortable NAD Eyes: normal lids, irises & conjunctiva ENT: grossly normal tongue not enlarged Neck: no masses Cardiovascular: S1 S2 Normal no rubs no gallop Respiratory: Scattered rhonchi expansion is equal Abdomen: soft non-distended Skin: no rash seen on limited exam Musculoskeletal:  no rigidity Psychiatric: unable to assess Neurologic: no involuntary movements          Lab Data and radiological Data:  Labs have been reviewed   Assessment/Plan  Patient Active Problem List   Diagnosis Date Noted   Anemia of chronic kidney failure, stage 3 (moderate) (HCC) 04/02/2020   Acute gout 04/02/2020   CHF (congestive heart failure) ([place]) 10/15/2018   Acute on chronic diastolic CHF (congestive heart failure) ([place]) 10/13/2018   Hypokalemia 06/08/2018   Lymphedema 06/08/2018   Closed displaced supracondylar fracture of distal end of left femur with intracondylar extension ([place]) 11/17/2017   CKD (chronic kidney disease) 11/14/2017   Hypothyroidism 11/14/2017   Atrial fibrillation ([place]) 11/14/2017   Anxiety state 11/14/2017   Peripheral neuropathy 11/14/2017   Bradycardia 08/13/2017   Microcytic anemia 03/31/2017   Iron deficiency anemia 03/31/2017   Chronic diastolic heart failure ([place]) 03/10/2017   HTN (hypertension) 03/10/2017    Diabetes ([place]) 03/10/2017   Obstructive sleep apnea 03/10/2017      Acute on chronic respiratory failure hypoxia continue currently is on T collar 60% FiO2. Chronic kidney disease continue to monitor patient's lab work Chronic diastolic heart failure at baseline Atrial fibrillation rate now rate controlled we will continue with supportive care Obstructive sleep apnea she should be able to use her CPAP we discussed with pulm respiratory therapy   I have personally evaluated the patient, evaluated the laboratory and imaging results and formulated the assessment and plan and placed orders as needed. The Patient requires high complexity decision making with multiple system involvement. Rounds were done with the Respiratory Therapy Director and respiratory therapist involved in the care of the patient as well as nursing staff.   Allyne Gee, MD [facility] Pulmonary Critical Care Medicine   This note is for inpatient care

## 2021-02-16 ENCOUNTER — Other Ambulatory Visit (HOSPITAL_COMMUNITY): Payer: Medicare Other | Admitting: Internal Medicine

## 2021-02-16 DIAGNOSIS — I4819 Other persistent atrial fibrillation: Secondary | ICD-10-CM | POA: Diagnosis not present

## 2021-02-16 DIAGNOSIS — I5033 Acute on chronic diastolic (congestive) heart failure: Secondary | ICD-10-CM | POA: Diagnosis not present

## 2021-02-16 DIAGNOSIS — F411 Generalized anxiety disorder: Secondary | ICD-10-CM

## 2021-02-16 DIAGNOSIS — G4733 Obstructive sleep apnea (adult) (pediatric): Secondary | ICD-10-CM

## 2021-02-16 DIAGNOSIS — I5032 Chronic diastolic (congestive) heart failure: Secondary | ICD-10-CM

## 2021-02-16 NOTE — Progress Notes (Signed)
[place]  PROGRESS NOTE  PULMONARY SERVICE ROUNDS   Andrea Rivers  DOB: [birth year]  Referring physician: Deanne Coffer, MD  HPI: Andrea Rivers is a 65 y.o. female  being seen for Acute on Chronic Respiratory Failure.  She has been using the BiPAP at nighttime while we are capping however during the daytime she has been refusing to do capping and has been having a great deal of anxiety.  I spoke to her and explained to her that we will put her on some high flow oxygen so that she does not have the sensation of suffocation that she may be not getting enough oxygen.  Review of Systems: Unremarkable other than noted in HPI  Allergies:  Reviewed on the [facility]  Medications: Reviewed  Vitals: Temperature is 97.8 pulse 73 respiratory 22 blood pressure is 167/78 saturations 98%  Ventilator Settings: Capping with BiPAP at night  Physical Exam: General:  calm and comfortable NAD Eyes: normal lids, irises & conjunctiva ENT: grossly normal tongue not enlarged Neck: no masses Cardiovascular: S1 S2 Normal no rubs no gallop Respiratory: No rhonchi very coarse breath sounds Abdomen: soft non-distended Skin: no rash seen on limited exam Musculoskeletal:  no rigidity Psychiatric: unable to assess Neurologic: no involuntary movements          Lab Data and radiological Data:  Labs have been reviewed   Assessment/Plan  Patient Active Problem List   Diagnosis Date Noted   Anemia of chronic kidney failure, stage 3 (moderate) (HCC) 04/02/2020   Acute gout 04/02/2020   CHF (congestive heart failure) ([place]) 10/15/2018   Acute on chronic diastolic CHF (congestive heart failure) ([place]) 10/13/2018   Hypokalemia 06/08/2018   Lymphedema 06/08/2018   Closed displaced supracondylar fracture of distal end of left femur with intracondylar extension ([place]) 11/17/2017   CKD (chronic kidney disease) 11/14/2017   Hypothyroidism 11/14/2017   Atrial fibrillation ([place]) 11/14/2017    Anxiety state 11/14/2017   Peripheral neuropathy 11/14/2017   Bradycardia 08/13/2017   Microcytic anemia 03/31/2017   Iron deficiency anemia 03/31/2017   Chronic diastolic heart failure ([place]) 03/10/2017   HTN (hypertension) 03/10/2017   Diabetes ([place]) 03/10/2017   Obstructive sleep apnea 03/10/2017      Acute on chronic respiratory failure hypoxia plan is going to be to continue to advance the weaning.  We will try to use the BiPAP at nighttime.  Do the capping during the daytime but place her on high flow oxygen to see if this helps her Chronic atrial fibrillation rate now rate is controlled we will continue to follow along closely Obstructive sleep apnea encourage compliance with the Pap Anxiety issues ongoing need to continue to monitor closely Chronic diastolic heart failure right now is compensated   I have personally evaluated the patient, evaluated the laboratory and imaging results and formulated the assessment and plan and placed orders as needed. The Patient requires high complexity decision making with multiple system involvement. Rounds were done with the Respiratory Therapy Director and respiratory therapist involved in the care of the patient as well as nursing staff.   Allyne Gee, MD [facility] Pulmonary Critical Care Medicine   This note is for inpatient care

## 2021-02-18 ENCOUNTER — Other Ambulatory Visit (HOSPITAL_COMMUNITY): Payer: Medicare Other | Admitting: Internal Medicine

## 2021-02-18 DIAGNOSIS — J9621 Acute and chronic respiratory failure with hypoxia: Secondary | ICD-10-CM

## 2021-02-18 DIAGNOSIS — I4819 Other persistent atrial fibrillation: Secondary | ICD-10-CM

## 2021-02-18 DIAGNOSIS — I5033 Acute on chronic diastolic (congestive) heart failure: Secondary | ICD-10-CM

## 2021-02-18 DIAGNOSIS — I5032 Chronic diastolic (congestive) heart failure: Secondary | ICD-10-CM

## 2021-02-19 ENCOUNTER — Other Ambulatory Visit (HOSPITAL_COMMUNITY): Payer: Medicare Other | Admitting: Internal Medicine

## 2021-02-19 DIAGNOSIS — I5032 Chronic diastolic (congestive) heart failure: Secondary | ICD-10-CM

## 2021-02-19 DIAGNOSIS — I5033 Acute on chronic diastolic (congestive) heart failure: Secondary | ICD-10-CM | POA: Diagnosis not present

## 2021-02-19 DIAGNOSIS — J9621 Acute and chronic respiratory failure with hypoxia: Secondary | ICD-10-CM | POA: Diagnosis not present

## 2021-02-19 DIAGNOSIS — I4819 Other persistent atrial fibrillation: Secondary | ICD-10-CM | POA: Diagnosis not present

## 2021-02-22 NOTE — Progress Notes (Signed)
[place]  PROGRESS NOTE  PULMONARY SERVICE ROUNDS   Andrea Rivers  DOB: [birth year]  Referring physician: Deanne Coffer, MD  HPI: Andrea Rivers is a 65 y.o. female  being seen for Acute on Chronic Respiratory Failure.  Continues to have issues with compliance with recommended therapy on the BiPAP at nighttime.  She is doing little bit better as far as using the high flow oxygen  Review of Systems: Unremarkable other than noted in HPI  Allergies:  Reviewed on the [facility]  Medications: Reviewed  Vitals: Temperature is 98.6 pulse 62 respiratory rate is 13 blood pressure is 133/63 saturations 100%  Ventilator Settings: Currently on heated high flow  Physical Exam: General:  calm and comfortable NAD Eyes: normal lids, irises & conjunctiva ENT: grossly normal tongue not enlarged Neck: no masses Cardiovascular: S1 S2 Normal no rubs no gallop Respiratory: No rhonchi no rales are noted at this time Abdomen: soft non-distended Skin: no rash seen on limited exam Musculoskeletal:  no rigidity Psychiatric: unable to assess Neurologic: no involuntary movements          Lab Data and radiological Data:  Labs have been reviewed   Assessment/Plan  Patient Active Problem List   Diagnosis Date Noted   Anemia of chronic kidney failure, stage 3 (moderate) (HCC) 04/02/2020   Acute gout 04/02/2020   CHF (congestive heart failure) ([place]) 10/15/2018   Acute on chronic diastolic CHF (congestive heart failure) ([place]) 10/13/2018   Hypokalemia 06/08/2018   Lymphedema 06/08/2018   Closed displaced supracondylar fracture of distal end of left femur with intracondylar extension ([place]) 11/17/2017   CKD (chronic kidney disease) 11/14/2017   Hypothyroidism 11/14/2017   Atrial fibrillation ([place]) 11/14/2017   Anxiety state 11/14/2017   Peripheral neuropathy 11/14/2017   Bradycardia 08/13/2017   Microcytic anemia 03/31/2017   Iron deficiency anemia 03/31/2017   Chronic  diastolic heart failure ([place]) 03/10/2017   HTN (hypertension) 03/10/2017   Diabetes ([place]) 03/10/2017   Obstructive sleep apnea 03/10/2017      Acute on chronic respiratory failure hypoxia we will continue with the usage of high flow during the daytime and use the BiPAP at nighttime.  Encouraged to be compliant. Chronic atrial fibrillation) rate is controlled we will continue to monitor. Chronic heart failure diastolic dysfunction supportive care we will continue with present therapy Sleep apnea encourage use of the BiPAP at nighttime   I have personally evaluated the patient, evaluated the laboratory and imaging results and formulated the assessment and plan and placed orders as needed. The Patient requires high complexity decision making with multiple system involvement. Rounds were done with the Respiratory Therapy Director and respiratory therapist involved in the care of the patient as well as nursing staff.   Allyne Gee, MD [facility] Pulmonary Critical Care Medicine   This note is for inpatient care

## 2021-02-22 NOTE — Progress Notes (Signed)
[place]  PROGRESS NOTE  PULMONARY SERVICE ROUNDS   Andrea Rivers  DOB: 04-Sep-1955  Referring physician: Deanne Coffer, MD  HPI: Andrea Rivers is a 65 y.o. female  being seen for Acute on Chronic Respiratory Failure.  She is capping currently seems to be tolerating it well.  Review of Systems: Unremarkable other than noted in HPI  Allergies:  Reviewed on the [facility]  Medications: Reviewed  Vitals: Temperature is 98.9 pulse 69 respiratory rate 17 blood pressure 121/66 saturations 99%  Ventilator Settings: Capping off the ventilator  Physical Exam: General:  calm and comfortable NAD Eyes: normal lids, irises & conjunctiva ENT: grossly normal tongue not enlarged Neck: no masses Cardiovascular: S1 S2 Normal no rubs no gallop Respiratory: No rhonchi very coarse breath sounds Abdomen: soft non-distended Skin: no rash seen on limited exam Musculoskeletal:  no rigidity Psychiatric: unable to assess Neurologic: no involuntary movements          Lab Data and radiological Data:  Labs been reviewed   Assessment/Plan  Patient Active Problem List   Diagnosis Date Noted   Anemia of chronic kidney failure, stage 3 (moderate) (HCC) 04/02/2020   Acute gout 04/02/2020   CHF (congestive heart failure) ([place]) 10/15/2018   Acute on chronic diastolic CHF (congestive heart failure) ([place]) 10/13/2018   Hypokalemia 06/08/2018   Lymphedema 06/08/2018   Closed displaced supracondylar fracture of distal end of left femur with intracondylar extension ([place]) 11/17/2017   CKD (chronic kidney disease) 11/14/2017   Hypothyroidism 11/14/2017   Atrial fibrillation ([place]) 11/14/2017   Anxiety state 11/14/2017   Peripheral neuropathy 11/14/2017   Bradycardia 08/13/2017   Microcytic anemia 03/31/2017   Iron deficiency anemia 03/31/2017   Chronic diastolic heart failure ([place]) 03/10/2017   HTN (hypertension) 03/10/2017   Diabetes ([place]) 03/10/2017   Obstructive sleep  apnea 03/10/2017      Acute on chronic respiratory failure with hypoxia plan is going to be to continue with the capping trials encourage compliance with the heated high flow. Chronic atrial fibrillation rate is controlled at this time we will continue to monitor closely. Obstructive sleep apnea encourage use of BiPAP at nighttime. Chronic diastolic heart failure compensated   I have personally evaluated the patient, evaluated the laboratory and imaging results and formulated the assessment and plan and placed orders as needed. The Patient requires high complexity decision making with multiple system involvement. Rounds were done with the Respiratory Therapy Director and respiratory therapist involved in the care of the patient as well as nursing staff.   Allyne Gee, MD [facility] Pulmonary Critical Care Medicine   This note is for inpatient care

## 2021-02-23 ENCOUNTER — Other Ambulatory Visit (HOSPITAL_COMMUNITY): Payer: Medicare Other | Admitting: Internal Medicine

## 2021-02-23 DIAGNOSIS — G4733 Obstructive sleep apnea (adult) (pediatric): Secondary | ICD-10-CM

## 2021-02-23 DIAGNOSIS — N183 Chronic kidney disease, stage 3 unspecified: Secondary | ICD-10-CM | POA: Diagnosis not present

## 2021-02-23 DIAGNOSIS — J9621 Acute and chronic respiratory failure with hypoxia: Secondary | ICD-10-CM | POA: Diagnosis not present

## 2021-02-23 DIAGNOSIS — I4819 Other persistent atrial fibrillation: Secondary | ICD-10-CM | POA: Diagnosis not present

## 2021-02-23 DIAGNOSIS — I5032 Chronic diastolic (congestive) heart failure: Secondary | ICD-10-CM

## 2021-02-23 NOTE — Progress Notes (Signed)
[place]  PROGRESS NOTE  PULMONARY SERVICE ROUNDS   KARLITA MCMURDO  DOB: August 25, 1955  Referring physician: Deanne Coffer, MD  HPI: GERTRUDE CLAYWELL is a 65 y.o. female  being seen for Acute on Chronic Respiratory Failure.  She is doing well capping off the ventilator.  She states that she is using the BiPAP at nighttime right now is on high flow oxygen 40%  Review of Systems: Unremarkable other than noted in HPI  Allergies:  Reviewed on the [facility]  Medications: Reviewed  Vitals: Temperature is 97.3 pulse 62 respiratory 19 blood pressure is 130/72 saturations 100%  Ventilator Settings: Capping off the ventilator on heated high flow  Physical Exam: General:  calm and comfortable NAD Eyes: normal lids, irises & conjunctiva ENT: grossly normal tongue not enlarged Neck: no masses Cardiovascular: S1 S2 Normal no rubs no gallop Respiratory: Scattered rhonchi expansion is equal Abdomen: soft non-distended Skin: no rash seen on limited exam Musculoskeletal:  no rigidity Psychiatric: unable to assess Neurologic: no involuntary movements          Lab Data and radiological Data:  Data has been reviewed   Assessment/Plan  Patient Active Problem List   Diagnosis Date Noted   Anemia of chronic kidney failure, stage 3 (moderate) (HCC) 04/02/2020   Acute gout 04/02/2020   CHF (congestive heart failure) ([place]) 10/15/2018   Acute on chronic diastolic CHF (congestive heart failure) ([place]) 10/13/2018   Hypokalemia 06/08/2018   Lymphedema 06/08/2018   Closed displaced supracondylar fracture of distal end of left femur with intracondylar extension ([place]) 11/17/2017   CKD (chronic kidney disease) 11/14/2017   Hypothyroidism 11/14/2017   Atrial fibrillation ([place]) 11/14/2017   Anxiety state 11/14/2017   Peripheral neuropathy 11/14/2017   Bradycardia 08/13/2017   Microcytic anemia 03/31/2017   Iron deficiency anemia 03/31/2017   Chronic diastolic heart failure  ([place]) 03/10/2017   HTN (hypertension) 03/10/2017   Diabetes ([place]) 03/10/2017   Obstructive sleep apnea 03/10/2017      Acute on chronic respiratory failure hypoxia plan is to continue with capping trials patient is doing well using the BiPAP at nighttime.  During the daytime I think we can start to wean down on the FiO2 slowly. Chronic kidney disease supportive care continue to monitor patient's labs and hydration status Chronic atrial fibrillation rate now rate is controlled we will continue with supportive care Chronic diastolic heart failure compensated Obstructive sleep apnea doing well with the CPAP device   I have personally evaluated the patient, evaluated the laboratory and imaging results and formulated the assessment and plan and placed orders as needed. The Patient requires high complexity decision making with multiple system involvement. Rounds were done with the Respiratory Therapy Director and respiratory therapist involved in the care of the patient as well as nursing staff.   Allyne Gee, MD [facility] Pulmonary Critical Care Medicine   This note is for inpatient care

## 2021-02-25 ENCOUNTER — Other Ambulatory Visit (HOSPITAL_COMMUNITY): Payer: Medicare Other | Admitting: Internal Medicine

## 2021-02-25 DIAGNOSIS — I5032 Chronic diastolic (congestive) heart failure: Secondary | ICD-10-CM | POA: Diagnosis not present

## 2021-02-25 DIAGNOSIS — J9621 Acute and chronic respiratory failure with hypoxia: Secondary | ICD-10-CM

## 2021-02-25 DIAGNOSIS — I4819 Other persistent atrial fibrillation: Secondary | ICD-10-CM

## 2021-02-25 DIAGNOSIS — G4733 Obstructive sleep apnea (adult) (pediatric): Secondary | ICD-10-CM | POA: Diagnosis not present

## 2021-02-26 NOTE — Progress Notes (Signed)
[place]  PROGRESS NOTE  PULMONARY SERVICE ROUNDS   Andrea Rivers  DOB: 1956-05-05  Referring physician: Deanne Coffer, MD  HPI: Andrea Rivers is a 65 y.o. female  being seen for Acute on Chronic Respiratory Failure.  Patient is doing well capping now for over 7 days  Review of Systems: Unremarkable other than noted in HPI  Allergies:  Reviewed on the [facility]  Medications: Reviewed  Vitals: Temperature is 98.9 pulse 64 respiratory rate is 19 blood pressure is 131/66 saturations 99%  Ventilator Settings: Capping off the ventilator  Physical Exam: General:  calm and comfortable NAD Eyes: normal lids, irises & conjunctiva ENT: grossly normal tongue not enlarged Neck: no masses Cardiovascular: S1 S2 Normal no rubs no gallop Respiratory: No rhonchi no rales are noted at this time Abdomen: soft non-distended Skin: no rash seen on limited exam Musculoskeletal:  no rigidity Psychiatric: unable to assess Neurologic: no involuntary movements          Lab Data and radiological Data:  Labs have been reviewed   Assessment/Plan  Patient Active Problem List   Diagnosis Date Noted   Anemia of chronic kidney failure, stage 3 (moderate) (HCC) 04/02/2020   Acute gout 04/02/2020   CHF (congestive heart failure) ([place]) 10/15/2018   Acute on chronic diastolic CHF (congestive heart failure) ([place]) 10/13/2018   Hypokalemia 06/08/2018   Lymphedema 06/08/2018   Closed displaced supracondylar fracture of distal end of left femur with intracondylar extension ([place]) 11/17/2017   CKD (chronic kidney disease) 11/14/2017   Hypothyroidism 11/14/2017   Atrial fibrillation ([employer]) 11/14/2017   Anxiety state 11/14/2017   Peripheral neuropathy 11/14/2017   Bradycardia 08/13/2017   Microcytic anemia 03/31/2017   Iron deficiency anemia 03/31/2017   Chronic diastolic heart failure ([place]) 03/10/2017   HTN (hypertension) 03/10/2017   Diabetes ([place]) 03/10/2017   Obstructive  sleep apnea 03/10/2017      Acute on chronic respiratory failure hypoxia she is done extremely well with capping she is now tolerating the nasal cannula well 2.  We will proceed to decannulation Chronic diastolic dysfunction compensated we will monitor patient's fluid status Atrial fibrillation rate is controlled we will continue with supportive care Obstructive sleep apnea appears to be compliant with her positive airway pressure   I have personally evaluated the patient, evaluated the laboratory and imaging results and formulated the assessment and plan and placed orders as needed. The Patient requires high complexity decision making with multiple system involvement. Rounds were done with the Respiratory Therapy Director and respiratory therapist involved in the care of the patient as well as nursing staff.   Allyne Gee, MD [facility] Pulmonary Critical Care Medicine   This note is for inpatient care

## 2021-03-22 ENCOUNTER — Inpatient Hospital Stay
Admission: EM | Admit: 2021-03-22 | Discharge: 2021-04-09 | DRG: 270 | Disposition: E | Payer: Medicare Other | Source: Skilled Nursing Facility | Attending: Internal Medicine | Admitting: Internal Medicine

## 2021-03-22 ENCOUNTER — Emergency Department: Payer: Medicare Other

## 2021-03-22 DIAGNOSIS — I469 Cardiac arrest, cause unspecified: Secondary | ICD-10-CM | POA: Diagnosis present

## 2021-03-22 DIAGNOSIS — Z66 Do not resuscitate: Secondary | ICD-10-CM | POA: Diagnosis present

## 2021-03-22 DIAGNOSIS — E876 Hypokalemia: Secondary | ICD-10-CM | POA: Diagnosis not present

## 2021-03-22 DIAGNOSIS — Z6841 Body Mass Index (BMI) 40.0 and over, adult: Secondary | ICD-10-CM

## 2021-03-22 DIAGNOSIS — Z87891 Personal history of nicotine dependence: Secondary | ICD-10-CM

## 2021-03-22 DIAGNOSIS — Z79890 Hormone replacement therapy (postmenopausal): Secondary | ICD-10-CM

## 2021-03-22 DIAGNOSIS — D72829 Elevated white blood cell count, unspecified: Secondary | ICD-10-CM | POA: Diagnosis present

## 2021-03-22 DIAGNOSIS — Z515 Encounter for palliative care: Secondary | ICD-10-CM

## 2021-03-22 DIAGNOSIS — D62 Acute posthemorrhagic anemia: Secondary | ICD-10-CM | POA: Diagnosis present

## 2021-03-22 DIAGNOSIS — R680 Hypothermia, not associated with low environmental temperature: Secondary | ICD-10-CM | POA: Diagnosis present

## 2021-03-22 DIAGNOSIS — E039 Hypothyroidism, unspecified: Secondary | ICD-10-CM | POA: Diagnosis present

## 2021-03-22 DIAGNOSIS — Z881 Allergy status to other antibiotic agents status: Secondary | ICD-10-CM

## 2021-03-22 DIAGNOSIS — Z91018 Allergy to other foods: Secondary | ICD-10-CM

## 2021-03-22 DIAGNOSIS — F419 Anxiety disorder, unspecified: Secondary | ICD-10-CM | POA: Diagnosis present

## 2021-03-22 DIAGNOSIS — G40901 Epilepsy, unspecified, not intractable, with status epilepticus: Secondary | ICD-10-CM | POA: Diagnosis present

## 2021-03-22 DIAGNOSIS — K219 Gastro-esophageal reflux disease without esophagitis: Secondary | ICD-10-CM | POA: Diagnosis present

## 2021-03-22 DIAGNOSIS — J449 Chronic obstructive pulmonary disease, unspecified: Secondary | ICD-10-CM | POA: Diagnosis present

## 2021-03-22 DIAGNOSIS — Z7951 Long term (current) use of inhaled steroids: Secondary | ICD-10-CM

## 2021-03-22 DIAGNOSIS — I4891 Unspecified atrial fibrillation: Secondary | ICD-10-CM | POA: Diagnosis present

## 2021-03-22 DIAGNOSIS — G4733 Obstructive sleep apnea (adult) (pediatric): Secondary | ICD-10-CM | POA: Diagnosis present

## 2021-03-22 DIAGNOSIS — E872 Acidosis: Secondary | ICD-10-CM | POA: Diagnosis present

## 2021-03-22 DIAGNOSIS — Z7984 Long term (current) use of oral hypoglycemic drugs: Secondary | ICD-10-CM

## 2021-03-22 DIAGNOSIS — Z833 Family history of diabetes mellitus: Secondary | ICD-10-CM

## 2021-03-22 DIAGNOSIS — Z8249 Family history of ischemic heart disease and other diseases of the circulatory system: Secondary | ICD-10-CM

## 2021-03-22 DIAGNOSIS — I214 Non-ST elevation (NSTEMI) myocardial infarction: Principal | ICD-10-CM | POA: Diagnosis present

## 2021-03-22 DIAGNOSIS — Z794 Long term (current) use of insulin: Secondary | ICD-10-CM

## 2021-03-22 DIAGNOSIS — N1831 Chronic kidney disease, stage 3a: Secondary | ICD-10-CM | POA: Diagnosis present

## 2021-03-22 DIAGNOSIS — Z79899 Other long term (current) drug therapy: Secondary | ICD-10-CM

## 2021-03-22 DIAGNOSIS — N179 Acute kidney failure, unspecified: Secondary | ICD-10-CM | POA: Diagnosis present

## 2021-03-22 DIAGNOSIS — Z9049 Acquired absence of other specified parts of digestive tract: Secondary | ICD-10-CM

## 2021-03-22 DIAGNOSIS — R58 Hemorrhage, not elsewhere classified: Secondary | ICD-10-CM | POA: Diagnosis present

## 2021-03-22 DIAGNOSIS — R579 Shock, unspecified: Secondary | ICD-10-CM

## 2021-03-22 DIAGNOSIS — Z888 Allergy status to other drugs, medicaments and biological substances status: Secondary | ICD-10-CM

## 2021-03-22 DIAGNOSIS — D631 Anemia in chronic kidney disease: Secondary | ICD-10-CM | POA: Diagnosis present

## 2021-03-22 DIAGNOSIS — R778 Other specified abnormalities of plasma proteins: Secondary | ICD-10-CM | POA: Diagnosis present

## 2021-03-22 DIAGNOSIS — M109 Gout, unspecified: Secondary | ICD-10-CM | POA: Diagnosis present

## 2021-03-22 DIAGNOSIS — K661 Hemoperitoneum: Secondary | ICD-10-CM | POA: Diagnosis present

## 2021-03-22 DIAGNOSIS — K7200 Acute and subacute hepatic failure without coma: Secondary | ICD-10-CM | POA: Diagnosis present

## 2021-03-22 DIAGNOSIS — E1122 Type 2 diabetes mellitus with diabetic chronic kidney disease: Secondary | ICD-10-CM | POA: Diagnosis present

## 2021-03-22 DIAGNOSIS — Z20822 Contact with and (suspected) exposure to covid-19: Secondary | ICD-10-CM | POA: Diagnosis present

## 2021-03-22 DIAGNOSIS — I468 Cardiac arrest due to other underlying condition: Secondary | ICD-10-CM | POA: Diagnosis present

## 2021-03-22 DIAGNOSIS — I130 Hypertensive heart and chronic kidney disease with heart failure and stage 1 through stage 4 chronic kidney disease, or unspecified chronic kidney disease: Secondary | ICD-10-CM | POA: Diagnosis present

## 2021-03-22 DIAGNOSIS — G9341 Metabolic encephalopathy: Secondary | ICD-10-CM | POA: Diagnosis present

## 2021-03-22 DIAGNOSIS — R578 Other shock: Secondary | ICD-10-CM | POA: Diagnosis present

## 2021-03-22 DIAGNOSIS — Z79891 Long term (current) use of opiate analgesic: Secondary | ICD-10-CM

## 2021-03-22 DIAGNOSIS — Z7901 Long term (current) use of anticoagulants: Secondary | ICD-10-CM

## 2021-03-22 DIAGNOSIS — Z91040 Latex allergy status: Secondary | ICD-10-CM

## 2021-03-22 DIAGNOSIS — E1142 Type 2 diabetes mellitus with diabetic polyneuropathy: Secondary | ICD-10-CM | POA: Diagnosis present

## 2021-03-22 DIAGNOSIS — J9601 Acute respiratory failure with hypoxia: Secondary | ICD-10-CM

## 2021-03-22 DIAGNOSIS — Z825 Family history of asthma and other chronic lower respiratory diseases: Secondary | ICD-10-CM

## 2021-03-22 DIAGNOSIS — I5032 Chronic diastolic (congestive) heart failure: Secondary | ICD-10-CM | POA: Diagnosis present

## 2021-03-22 LAB — CBG MONITORING, ED: Glucose-Capillary: 324 mg/dL — ABNORMAL HIGH (ref 70–99)

## 2021-03-22 MED ORDER — MIDAZOLAM HCL 5 MG/5ML IJ SOLN
4.0000 mg | Freq: Once | INTRAMUSCULAR | Status: AC
  Administered 2021-03-22: 4 mg via INTRAVENOUS

## 2021-03-22 MED ORDER — ROCURONIUM BROMIDE 50 MG/5ML IV SOLN
100.0000 mg | Freq: Once | INTRAVENOUS | Status: AC
  Administered 2021-03-22: 100 mg via INTRAVENOUS
  Filled 2021-03-22: qty 10

## 2021-03-22 MED ORDER — FENTANYL 2500MCG IN NS 250ML (10MCG/ML) PREMIX INFUSION: 0.0000 ug/h | INTRAVENOUS | Status: DC

## 2021-03-22 MED ORDER — VANCOMYCIN HCL 2000 MG/400ML IV SOLN
2000.0000 mg | Freq: Once | INTRAVENOUS | Status: AC
  Administered 2021-03-23: 2000 mg via INTRAVENOUS
  Filled 2021-03-22 (×2): qty 400

## 2021-03-22 MED ORDER — VANCOMYCIN HCL IN DEXTROSE 1-5 GM/200ML-% IV SOLN: 1000.0000 mg | Freq: Once | INTRAVENOUS | Status: DC

## 2021-03-22 MED ORDER — SODIUM CHLORIDE 0.9 % IV BOLUS (SEPSIS)
1000.0000 mL | Freq: Once | INTRAVENOUS | Status: AC
  Administered 2021-03-23: 1000 mL via INTRAVENOUS

## 2021-03-22 MED ORDER — MIDAZOLAM-SODIUM CHLORIDE 100-0.9 MG/100ML-% IV SOLN
0.5000 mg/h | INTRAVENOUS | Status: DC
Start: 2021-03-22 — End: 2021-03-24

## 2021-03-22 MED ORDER — METRONIDAZOLE 500 MG/100ML IV SOLN
500.0000 mg | Freq: Once | INTRAVENOUS | Status: AC
  Administered 2021-03-23: 500 mg via INTRAVENOUS
  Filled 2021-03-22: qty 100

## 2021-03-22 MED ORDER — SODIUM CHLORIDE 0.9 % IV SOLN
2.0000 g | Freq: Once | INTRAVENOUS | Status: AC
  Administered 2021-03-23: 2 g via INTRAVENOUS
  Filled 2021-03-22: qty 2

## 2021-03-22 MED ORDER — SODIUM CHLORIDE 0.9 % IV BOLUS (SEPSIS)
1000.0000 mL | Freq: Once | INTRAVENOUS | Status: AC
  Administered 2021-03-22: 1000 mL via INTRAVENOUS

## 2021-03-22 NOTE — ED Provider Notes (Addendum)
I arrived shortly after patient arrival to assist Dr. Joni Fears and patient's initial evaluation resuscitation.  Patient in brief presented EMS from facility for unresponsiveness.  Almost immediately after patient arrival she became pulseless.  She was immediately intubated and CPR was started.  ROSC was obtained after 2 rounds of CPR with an approximately 5 minutes of total compressions.  Patient started on Levophed drip for soft pressures.  I assumed care from Dr. Joni Fears.  Triple-lumen placed in the right femoral line by myself.  ECG shows sinus rhythm with intraventricular complexes, rate of 85 and some nonspecific changes in inferior leads.  Patient overall appears grossly in shock prior to sedation medicine given his unresponsiveness and cool clammy extremities.   .Central Line  Date/Time: 04/02/2021 11:53 PM Performed by: Lucrezia Starch, MD Authorized by: Lucrezia Starch, MD   Consent:    Consent obtained:  Emergent situation Procedure details:    Location:  R femoral   Patient position:  Supine   Procedural supplies:  Triple lumen   Landmarks identified: yes     Ultrasound guidance: yes     Number of attempts:  2 Post-procedure details:    Post-procedure:  Dressing applied and line sutured   Assessment:  Blood return through all ports and free fluid flow   Procedure completion:  Tolerated well, no immediate complications .Critical Care  Date/Time: 26-Mar-2021 2:41 AM Performed by: Lucrezia Starch, MD Authorized by: Lucrezia Starch, MD   Critical care provider statement:    Critical care time (minutes):  60   Critical care was necessary to treat or prevent imminent or life-threatening deterioration of the following conditions:  Shock, circulatory failure and CNS failure or compromise   Critical care was time spent personally by me on the following activities:  Discussions with consultants, evaluation of patient's response to treatment, examination of patient, ordering and  performing treatments and interventions, ordering and review of laboratory studies, ordering and review of radiographic studies, pulse oximetry, re-evaluation of patient's condition, obtaining history from patient or surrogate and review of old charts   I assumed direction of critical care for this patient from another provider in my specialty: yes     Care discussed with: admitting provider    I was able to speak with patient's niece who states she visited with the patient earlier today.  The patient had an episode that resembled a seizure and had been complaining of some nausea earlier in the day.  She has no seizure history.  Her blood sugar then reportedly had trended down throughout the afternoon or evening and 911 was called.  Patient had another possible seizure-like episode with EMS it seems before becoming unresponsive.  Differential are under expansiveness an episode earlier today as well as cardiac arrest is quite broad and includes ACS, PE, septic shock, hemorrhagic shock, and severe metabolic derangements.  Immediately after ROSC was obtained patient did have very low blood pressures.  She was started on high-dose Levophed which was subsequently uptitrated and vasopressin was also added  CMP remarkable for bicarb of 12, glucose of 355, creatinine of 2.72 compared to 2.17 10 months ago, AST of 1210, ALT of 898 and anion gap of 28.  Acetaminophen level is undetectable.  Salicylate level is 7.1.  Also undetectable.  COVID influenza PCR is negative.  Initial troponin elevated 59 I suspect represent demand ischemia.  I suspect patient's transaminitis is from shock liver from poor perfusion during cardiac arrest.  CBC markable for leukocytosis with  WBC count 26,000 as well as acute anemia with hemoglobin 6.4.  On review of outside records it seems patient's hemoglobin was 12.1 on 8/6.  Concern for element of hemorrhagic shock.  1 unit of emergency release.  CBC is ordered and 2 units ordered to be  crossmatched.  Lactic acid is greater than 11.  ABG with a pH of 6.92 with a PCO2 of 39 and a bicarb of 8.  Had extensive discussion with patient's closest relative her niece Thornton Dales who has been patient's primary decision-maker over the last several months.  After explaining patient's dire condition and concern that patient will likely not recover patient's niece states that patient at this point would want to be made DNR.  This was updated in the chart and team updated.  She states she thinks patient would otherwise want any additional medical measures continued.  CT head is unremarkable.  CTA chest shows no evidence of PE but moderate to severe scarring and atelectasis.  There is a small right pleural effusion and multiple acute right-sided rib fractures and numerous bilateral chronic rib fractures.  There is also a sternal fracture and significant postop changes throughout the thoracolumbar spine.  CT abdomen pelvis markable for extremely large hematoma in the subcu fat of the anterior pelvic wall with concerns of possible active bleeding.  There is also evidence of prior cholecystectomy and no other clear evidence of intra-abdominal hemorrhage.   Given concern for hemorrhagic shock as etiology for cardiac arrest active extravasation I consulted interventional radiologist Dr. Earleen Newport who will come see the patient.  MTP ordered.  Patient placed in active lumbar due to downtrending temperatures.  Patient will be admitted to the ICU service in critical condition.     Lucrezia Starch, MD 01-Apr-2021 [id number]    Lucrezia Starch, MD 04/01/2021 [phone]

## 2021-03-22 NOTE — Progress Notes (Signed)
PHARMACY -  BRIEF ANTIBIOTIC NOTE   Pharmacy has received consult(s) for Cefepime and Vancomycin from an ED provider.  The patient's profile has been reviewed for ht/wt/allergies/indication/available labs.    One time order(s) placed for Cefepime 2 gm and Vancomycin 2 gm.  Further antibiotics/pharmacy consults should be ordered by admitting physician if indicated.                       Renda Rolls, PharmD, [facility] 04/05/2021 11:42 PM

## 2021-03-22 NOTE — ED Notes (Signed)
Dr. At bedside preparing for intubation

## 2021-03-22 NOTE — Progress Notes (Signed)
CODE SEPSIS - PHARMACY COMMUNICATION  **Broad Spectrum Antibiotics should be administered within 1 hour of Sepsis diagnosis**  Time Code Sepsis Called/Page Received: 2303  Antibiotics Ordered: Cefepime, Flagyl, Vancomycin  CODE BLUE CALLED 2310: CPR Initiated  Time of 1st antibiotic administration: 0020  Renda Rolls, PharmD, [facility] 03/09/2021 11:41 PM

## 2021-03-22 NOTE — ED Provider Notes (Signed)
[facility] Emergency Department Provider Note  ____________________________________________  Time seen: Approximately 10:34 PM  I have reviewed the triage vital signs and the nursing notes.   HISTORY  Chief Complaint Seizures (Pt was complaining of Nausea all day. No Hx of seizures. Pt is unresponsive and brought in on 15L non-rebreather.)    Level 5 Caveat: Portions of the History and Physical including HPI and review of systems are unable to be completely obtained due to patient altered mental status HPI Andrea Rivers is a 65 y.o. female with a history of CHF COPD atrial fibrillation hypertension morbid obesity who was brought to the ED due to seizures today.  She had 2 seizures at her healthcare facility today, and a third on arrival to the emergency department.  She is unable to provide any history or interact.  No known trauma.  She is on warfarin and Lovenox.  She recently had surgery for tibial plateau fracture on Dec 16, 2020.  Past Medical History:  Diagnosis Date   Anemia    Anxiety    CHF (congestive heart failure) ([place])    Chronic kidney disease    INSUFFICIENCY   Complication of anesthesia    had to be intubated during cataract surgery   " I could not breath "   COPD (chronic obstructive pulmonary disease) ([place])    Diabetes mellitus without complication ([place])    Dyspnea    DOE   Dysrhythmia    A FIB   Edema    Fracture of distal femur ([place]) 11/2017   left    GERD (gastroesophageal reflux disease)    History of kidney stones    Hypertension    Hypothyroidism    ABLATION   Iron deficiency anemia 03/31/2017   Neuropathy    Orthopnea    Sleep apnea    CPAP   Vertigo    Wheezing      Patient Active Problem List   Diagnosis Date Noted   Anemia of chronic kidney failure, stage 3 (moderate) ([place]) 04/02/2020   Acute gout 04/02/2020   CHF (congestive heart failure) ([place]) 10/15/2018   Acute on chronic diastolic CHF (congestive  heart failure) ([place]) 10/13/2018   Hypokalemia 06/08/2018   Lymphedema 06/08/2018   Closed displaced supracondylar fracture of distal end of left femur with intracondylar extension ([place]) 11/17/2017   CKD (chronic kidney disease) 11/14/2017   Hypothyroidism 11/14/2017   Atrial fibrillation ([place]) 11/14/2017   Anxiety state 11/14/2017   Peripheral neuropathy 11/14/2017   Bradycardia 08/13/2017   Microcytic anemia 03/31/2017   Iron deficiency anemia 03/31/2017   Chronic diastolic heart failure ([place]) 03/10/2017   HTN (hypertension) 03/10/2017   Diabetes ([place]) 03/10/2017   Obstructive sleep apnea 03/10/2017     Past Surgical History:  Procedure Laterality Date   CATARACT EXTRACTION W/PHACO Left 01/26/2017   Procedure: CATARACT EXTRACTION PHACO AND INTRAOCULAR LENS PLACEMENT ([place]);  Surgeon: Estill Cotta, MD;  Location: [facility] ORS;  Service: Ophthalmology;  Laterality: Left;  [place]   1:02.2 AP     23.7 CDE   28.92 fluid pack lot# [id number] H exp.05/08/2018   CHOLECYSTECTOMY     ESOPHAGOGASTRODUODENOSCOPY N/A 10/12/2018   Procedure: ESOPHAGOGASTRODUODENOSCOPY (EGD);  Surgeon: Toledo, Benay Pike, MD;  Location: [facility] ENDOSCOPY;  Service: Gastroenterology;  Laterality: N/A;   ORIF FEMUR FRACTURE Left 11/17/2017   Procedure: OPEN REDUCTION INTERNAL FIXATION (ORIF) DISTAL FEMUR FRACTURE;  Surgeon: Shona Needles, MD;  Location: [place];  Service: Orthopedics;  Laterality: Left;   TONSILLECTOMY  Prior to Admission medications   Medication Sig Start Date End Date Taking? Authorizing Provider  insulin regular (NOVOLIN R) 100 units/mL injection Inject into the skin. 12/25/20 12/25/21 Yes [provider]  acetaminophen (TYLENOL) 500 MG tablet Take 1,000 mg by mouth 2 (two) times daily as needed for mild pain, fever or headache.     [provider]  albuterol (PROAIR HFA) 108 (90 Base) MCG/ACT inhaler Inhale 2 puffs into the lungs every 4 (four) hours as needed for wheezing or  shortness of breath.  03/14/17   [provider]  ALLERGY RELIEF 10 MG tablet Take 10 mg by mouth daily. 03/17/21   [provider]  amiodarone (PACERONE) 200 MG tablet Take 200 mg by mouth daily. 05/10/17   [provider]  Biotin 10 MG CAPS Take 1 capsule by mouth daily.     [provider]  busPIRone (BUSPAR) 10 MG tablet Take 10 mg by mouth daily. 03/17/21   [provider]  busPIRone (BUSPAR) 5 MG tablet Take 5 mg by mouth 2 (two) times daily. 12/03/19   [provider]  calcium carbonate (TUMS - DOSED IN MG ELEMENTAL CALCIUM) 500 MG chewable tablet Chew 1 tablet by mouth daily. 03/16/21   [provider]  Calcium Carbonate-Vitamin D3 (CALCIUM 600-D) 600-400 MG-UNIT TABS Take 1 tablet by mouth 3 (three) times daily with meals. Patient reports taking twice daily    [provider]  cetirizine (ZYRTEC) 10 MG tablet Take 10 mg by mouth daily.    [provider]  Cholecalciferol (D3 HIGH POTENCY) 125 MCG (5000 UT) capsule Take 5,000 Units by mouth daily.     [provider]  Cholecalciferol (VITAMIN D3) 50 MCG (2000 UT) TABS Take 1 tablet by mouth daily. 03/17/21   [provider]  citalopram (CELEXA) 40 MG tablet Take 40 mg by mouth daily.     [provider]  clonazePAM (KLONOPIN) 1 MG tablet Take 1 tablet by mouth 2 (two) times daily as needed for anxiety.  05/17/18   [provider]  cloNIDine (CATAPRES) 0.1 MG tablet Take 1 tablet (0.1 mg total) by mouth daily. 02/06/19   Darylene Price A, FNP  Co-Enzyme Q-10 100 MG CAPS Take 100 mg by mouth daily.     [provider]  cyclobenzaprine (FLEXERIL) 5 MG tablet Take 5 mg by mouth 3 (three) times daily as needed for muscle spasms.    [provider]  docusate sodium (COLACE) 50 MG capsule Take 100-200 mg by mouth 2 (two) times daily. '200mg'$  in the morning and '100mg'$  in the evening    [provider]  enoxaparin (LOVENOX)  150 MG/ML injection Inject into the skin. 03/17/21   [provider]  febuxostat (ULORIC) 40 MG tablet Take 40 mg by mouth daily.     [provider]  fluticasone (FLONASE) 50 MCG/ACT nasal spray SHAKE LQ AND U 1 SPR IEN QD 12/04/18   [provider]  Fluticasone-Salmeterol (WIXELA INHUB) 250-50 MCG/DOSE AEPB Inhale 1 puff into the lungs 2 (two) times daily. Patient reports taking only PRN    [provider]  furosemide (LASIX) 40 MG tablet Take 40 mg by mouth 2 (two) times daily. 03/17/21   [provider]  gabapentin (NEURONTIN) 100 MG capsule Take 100 mg by mouth 3 (three) times daily.    [provider]  Garlic 123XX123 MG CAPS Take 1,000 mg by mouth daily.     [provider]  glimepiride (  AMARYL) 2 MG tablet Take 2 mg by mouth daily with breakfast.    [provider]  HYDROcodone-acetaminophen (NORCO) 10-325 MG tablet Take 1 tablet by mouth every 6 (six) hours as needed for severe pain.     [provider]  Insulin Detemir (LEVEMIR FLEXTOUCH) 100 UNIT/ML Pen Inject 23 Units into the skin at bedtime.  03/01/18   [provider]  insulin glargine-yfgn (SEMGLEE) 100 UNIT/ML Pen Inject 30 Units into the skin daily. 03/16/21   [provider]  insulin lispro (HUMALOG) 100 UNIT/ML injection Inject 15-20 Units into the skin 3 (three) times daily before meals. Up to 80units a day    [provider]  levothyroxine (SYNTHROID) 100 MCG tablet Take 100 mcg by mouth daily. 03/17/21   [provider]  levothyroxine (SYNTHROID) 75 MCG tablet Take 75 mcg by mouth daily. 03/17/21   [provider]  levothyroxine (SYNTHROID, LEVOTHROID) 175 MCG tablet Take 175 mcg by mouth every evening.     [provider]  linagliptin (TRADJENTA) 5 MG TABS tablet take 1 tablet by mouth once daily 01/03/18   [provider]  LORazepam (ATIVAN) 1 MG tablet Take by mouth. 03/16/21   [provider]   losartan (COZAAR) 25 MG tablet Take 1 tablet (25 mg total) by mouth daily. 02/06/19 05/29/20  Alisa Graff, FNP  lovastatin (MEVACOR) 20 MG tablet Take 20 mg by mouth at bedtime.    [provider]  magnesium oxide (MAG-OX) 400 MG tablet Take 400 mg by mouth 2 (two) times daily.    [provider]  meclizine (ANTIVERT) 25 MG tablet Take 25 mg by mouth daily as needed. 03/18/21   [provider]  metolazone (ZAROXOLYN) 5 MG tablet Take 5 mg by mouth as needed (for a weight gain of 2 pounds overnight or 5 pounds in a week; MAX OF 5 TABLETS AT A TIME).  04/18/17   [provider]  mirtazapine (REMERON) 15 MG tablet Take 15 mg by mouth at bedtime. 03/17/21   [provider]  Multiple Vitamins-Minerals (CENTRUM WOMEN) TABS Take 1 tablet by mouth daily.     [provider]  Omega-3 Fatty Acids (FISH OIL) 1000 MG CPDR Take 1 capsule by mouth daily.    [provider]  pantoprazole (PROTONIX) 40 MG tablet Take 1 tablet (40 mg total) by mouth 2 (two) times daily before a meal. 10/12/18   Fritzi Mandes, MD  polyethylene glycol (MIRALAX) 17 g packet Take 17 g by mouth daily. 06/23/19   Lannie Fields, PA-C  potassium chloride SA (KLOR-CON) 20 MEQ tablet TAKE 1 TABLET BY MOUTH TWICE DAILY AND DOUBLE DOSE WHEN TAKING METOLAZOLE 03/13/20   Darylene Price A, FNP  senna (SENOKOT) 8.6 MG TABS tablet Take 8.6-17.2 mg by mouth 2 (two) times daily. 17.'2mg'$  (2 tablets) in the morning and 8.'6mg'$  in the evening    [provider]  torsemide (DEMADEX) 20 MG tablet TAKE 2 TABLETS BY MOUTH TWICE DAILY Patient taking differently: 40 mg 2 (two) times daily.  05/21/20   Alisa Graff, FNP  traMADol (ULTRAM) 50 MG tablet Take 1 tablet (50 mg total) by mouth every 6 (six) hours as needed for moderate pain. 10/22/19   Johnn Hai, PA-C  vitamin C (ASCORBIC ACID) 500 MG tablet Take 500 mg by mouth daily.    [provider]  warfarin (COUMADIN) 5 MG  tablet Take 3.5 mg by mouth daily at 6 PM.  04/22/17  [provider]     Allergies Other, Latex, Exenatide, Garlic, Onion, Rosiglitazone, and Erythromycin   Family History  Problem Relation Age of Onset   COPD Mother    Heart disease Mother    Anemia Mother    Heart disease Father    COPD Father    Anemia Sister    Diabetes Maternal Grandmother    Hypertension Paternal Grandfather     Social History Social History   Tobacco Use   Smoking status: Former    Packs/day: 3.00    Types: Cigarettes    Quit date: 1988    Years since quitting: 34.6   Smokeless tobacco: Never  Vaping Use   Vaping Use: Never used  Substance Use Topics   Alcohol use: No   Drug use: No    Review of Systems Level 5 Caveat: Portions of the History and Physical including HPI and review of systems are unable to be completely obtained due to patient being a poor historian   Constitutional:   No known fever.  ENT:   No rhinorrhea. Cardiovascular:   No chest pain or syncope. Respiratory:   No dyspnea or cough. Gastrointestinal:   Negative for abdominal pain, vomiting and diarrhea.  Musculoskeletal:   Negative for focal pain or swelling ____________________________________________   PHYSICAL EXAM:  VITAL SIGNS: ED Triage Vitals  Enc Vitals Group     BP 04/02/2021 2235 (!) 87/66     Pulse Rate 04/05/2021 2233 85     Resp 03/24/2021 2235 14     Temp --      Temp src --      SpO2 03/18/2021 2235 (!) 86 %     Weight 03/24/2021 2235 (!) 302 lb 0.5 oz (137 kg)     Height --      Head Circumference --      Peak Flow --      Pain Score --      Pain Loc --      Pain Edu? --      Excl. in [place]? --     Vital signs reviewed, nursing assessments reviewed.   Constitutional:   Comatose.  Pale, cool skin, ill-appearing. Eyes:   Conjunctivae are normal.  Pupils symmetric, 5 mm bilaterally. ENT      Head:   Normocephalic and atraumatic.      Nose:   No congestion/rhinnorhea.  No epistaxis       Mouth/Throat:   MMM, no pharyngeal erythema. No peritonsillar mass.       Neck:   No meningismus. Full ROM. Hematological/Lymphatic/Immunilogical:   No cervical lymphadenopathy. Cardiovascular:   RRR. Symmetric bilateral radial and DP pulses.  No murmurs. Cap refill 3 seconds. Respiratory:   Normal respiratory effort without tachypnea/retractions. Breath sounds are clear and equal bilaterally. No wheezes/rales/rhonchi. Gastrointestinal:   Soft and nontender. Non distended. There is no CVA tenderness.  No rebound, rigidity, or guarding. Genitourinary:   Normal external genitalia Musculoskeletal:   Normal passive range of motion in all extremities. No joint effusions.  No lower extremity tenderness.  No edema.  Surgical incision at the left proximal tibia has healed well.  No inflammatory changes. Neurologic:   GCS = E1 V1 M 3 = 5  Moves all extremities Gaze fixed is to the right with seizure.  Skin:    Skin is cool, dry and intact. No rash noted.  No petechiae, purpura, or bullae.  ____________________________________________    LABS (pertinent positives/negatives) (all labs ordered are listed,  but only abnormal results are displayed) Labs Reviewed  CBG MONITORING, ED - Abnormal; Notable for the following components:      Result Value   Glucose-Capillary 324 (*)    All other components within normal limits  RESP PANEL BY RT-PCR (FLU A&B, COVID) ARPGX2  CULTURE, BLOOD (ROUTINE X 2)  CULTURE, BLOOD (ROUTINE X 2)  URINE CULTURE  LACTIC ACID, PLASMA  LACTIC ACID, PLASMA  COMPREHENSIVE METABOLIC PANEL  CBC WITH DIFFERENTIAL/PLATELET  PROTIME-INR  APTT  URINALYSIS, COMPLETE (UACMP) WITH MICROSCOPIC  BETA-HYDROXYBUTYRIC ACID  PROCALCITONIN  URINE DRUG SCREEN, QUALITATIVE ([facility] ONLY)  ACETAMINOPHEN LEVEL  SALICYLATE LEVEL  ETHANOL  MAGNESIUM  BLOOD GAS, VENOUS  BRAIN NATRIURETIC PEPTIDE  TYPE AND SCREEN  TROPONIN I (HIGH SENSITIVITY)    ____________________________________________   EKG  Interpreted by me Sinus rhythm rate of 85, normal axis, normal intervals.  Normal QRS ST segments and T waves.  1 PVC on the strip.  ____________________________________________    RADIOLOGY  DG Chest Port 1 View  Result Date: 03/11/2021 CLINICAL DATA:  Questionable sepsis - evaluate for abnormality Endotracheal and orogastric tube placement. EXAM: PORTABLE CHEST 1 VIEW COMPARISON:  Chest radiograph 05/25/2020 FINDINGS: Endotracheal tube tip is 2.7 cm from the carina. Enteric tube is in place. The tip is not included in the field of view and appears to be below the diaphragm. Lung volumes are low. Stable heart size and mediastinal contours. Bibasilar atelectasis. No pulmonary edema, pleural effusion, or pneumothorax. Thoracic spinal fusion hardware is new from prior exam. Right lateral rib fractures are age indeterminate. IMPRESSION: 1. Endotracheal tube tip 2.7 cm from the carina. Enteric tube tip appears to be below the diaphragm, but is not included in the field of view. 2. Low lung volumes with bibasilar atelectasis. 3. Right lateral rib fractures are age indeterminate. Electronically Signed   By: Keith Rake M.D.   On: 03/16/2021 23:28    ____________________________________________   PROCEDURES .Critical Care  Date/Time: 03/16/2021 11:49 PM Performed by: Carrie Mew, MD Authorized by: Carrie Mew, MD   Critical care provider statement:    Critical care time (minutes):  30   Critical care time was exclusive of:  Separately billable procedures and treating other patients   Critical care was necessary to treat or prevent imminent or life-threatening deterioration of the following conditions:  CNS failure or compromise, respiratory failure, circulatory failure and shock   Critical care was time spent personally by me on the following activities:  Development of treatment plan with patient or surrogate,  discussions with consultants, evaluation of patient's response to treatment, examination of patient, obtaining history from patient or surrogate, ordering and performing treatments and interventions, ordering and review of laboratory studies, ordering and review of radiographic studies, pulse oximetry, re-evaluation of patient's condition, review of old charts, blood draw for specimens and vascular access procedures Comments:        Angiocath insertion  Date/Time: 03/09/2021 11:49 PM Performed by: Carrie Mew, MD Authorized by: Carrie Mew, MD  Consent: The procedure was performed in an emergent situation. Local anesthesia used: no  Anesthesia: Local anesthesia used: no  Sedation: Patient sedated: no  Patient tolerance: patient tolerated the procedure well with no immediate complications Comments: [place] visualization. 20g, R upper arm, 1 attempt, EBL 0.   IO LINE INSERTION  Date/Time: 04/07/2021 11:51 PM Performed by: Carrie Mew, MD Authorized by: Carrie Mew, MD   Consent:    Consent obtained:  Emergent situation Pre-procedure details:    Site preparation:  Alcohol   Preparation: Patient was prepped and draped in usual sterile fashion   Anesthesia:    Anesthesia method:  None Procedure details:    Insertion site:  R proximal tibia   Insertion device:  Drill device   Insertion: Needle was inserted through the bony cortex     Number of attempts:  1   Insertion confirmation:  Aspiration of blood/marrow, easy infusion of fluids and stability of the needle Post-procedure details:    Secured with:  Protective shield and elastic bandage   Procedure completion:  Tolerated  ____________________________________________  DIFFERENTIAL DIAGNOSIS   Intracranial hemorrhage, non-STEMI, PE, septic shock  CLINICAL IMPRESSION / ASSESSMENT AND PLAN / ED COURSE  Medications ordered in the ED: Medications  fentaNYL 2525mg in NS 2515m(108mml) infusion-PREMIX  (has no administration in time range)  midazolam (VERSED) 100 mg/100 mL (1 mg/mL) premix infusion (has no administration in time range)  midazolam (VERSED) 5 MG/5ML injection 4 mg (has no administration in time range)  rocuronium (ZEMURON) injection 100 mg (has no administration in time range)  ceFEPIme (MAXIPIME) 2 g in sodium chloride 0.9 % 100 mL IVPB (has no administration in time range)  metroNIDAZOLE (FLAGYL) IVPB 500 mg (has no administration in time range)  sodium chloride 0.9 % bolus 1,000 mL (has no administration in time range)    And  sodium chloride 0.9 % bolus 1,000 mL (has no administration in time range)  vancomycin (VANCOREADY) IVPB 2000 mg/400 mL (has no administration in time range)    Pertinent labs & imaging results that were available during my care of the patient were reviewed by me and considered in my medical decision making (see chart for details).   DorAHLAAM HARTONs evaluated in Emergency Department on 04/04/2021 for the symptoms described in the history of present illness. She was evaluated in the context of the global COVID-19 pandemic, which necessitated consideration that the patient might be at risk for infection with the SARS-CoV-2 virus that causes COVID-19. Institutional protocols and algorithms that pertain to the evaluation of patients at risk for COVID-19 are in a state of rapid change based on information released by regulatory bodies including the CDC and federal and state organizations. These policies and algorithms were followed during the patient's care in the ED.   Patient presents in status epilepticus with hypotension.  Recent surgery and morbid obesity raises suspicion for PE, but being anticoagulated and having status epilepticus makes intracranial hemorrhage more likely.  Needs sepsis work-up.  Patient intubated on arrival for airway protection.  Due to lack of IV access, she required intraosseous access at the right proximal tibia.   Care  signed out to Dr. SmiTamala Julian 11:00 PM to follow-up on work-up, plan to admit to ICU.       ____________________________________________   FINAL CLINICAL IMPRESSION(S) / ED DIAGNOSES    Final diagnoses:  Status [facility]pticus ([facility]   ED Discharge Orders     None       Portions of this note were generated with dragon dictation software. Dictation errors may occur despite best attempts at proofreading.   StaCarrie MewD 03/18/2021 2352

## 2021-03-22 NOTE — ED Notes (Signed)
No palpable by this RN and [place] representative, CPR initiated.

## 2021-03-22 NOTE — ED Notes (Signed)
Attempting IO at this time. Unable to obtain IV access at this time.

## 2021-03-22 NOTE — ED Notes (Signed)
Pt intubated at this time. Size 7.5.  23cm at the lips positive color change noted. Bilateral breath sounds.

## 2021-03-23 ENCOUNTER — Inpatient Hospital Stay: Payer: Medicare Other | Admitting: Radiology

## 2021-03-23 ENCOUNTER — Inpatient Hospital Stay
Admit: 2021-03-23 | Discharge: 2021-03-23 | Disposition: A | Discharge status: Home / Self Care | Payer: Medicare Other | Attending: Pulmonary Disease | Admitting: Pulmonary Disease

## 2021-03-23 ENCOUNTER — Inpatient Hospital Stay: Payer: Medicare Other

## 2021-03-23 ENCOUNTER — Emergency Department: Payer: Medicare Other

## 2021-03-23 DIAGNOSIS — I130 Hypertensive heart and chronic kidney disease with heart failure and stage 1 through stage 4 chronic kidney disease, or unspecified chronic kidney disease: Secondary | ICD-10-CM | POA: Diagnosis present

## 2021-03-23 DIAGNOSIS — J9601 Acute respiratory failure with hypoxia: Secondary | ICD-10-CM | POA: Diagnosis present

## 2021-03-23 DIAGNOSIS — I469 Cardiac arrest, cause unspecified: Principal | ICD-10-CM | POA: Diagnosis present

## 2021-03-23 DIAGNOSIS — I4891 Unspecified atrial fibrillation: Secondary | ICD-10-CM | POA: Diagnosis present

## 2021-03-23 DIAGNOSIS — J449 Chronic obstructive pulmonary disease, unspecified: Secondary | ICD-10-CM | POA: Diagnosis present

## 2021-03-23 DIAGNOSIS — K661 Hemoperitoneum: Secondary | ICD-10-CM | POA: Diagnosis present

## 2021-03-23 DIAGNOSIS — D631 Anemia in chronic kidney disease: Secondary | ICD-10-CM | POA: Diagnosis present

## 2021-03-23 DIAGNOSIS — Z66 Do not resuscitate: Secondary | ICD-10-CM | POA: Diagnosis present

## 2021-03-23 DIAGNOSIS — I5032 Chronic diastolic (congestive) heart failure: Secondary | ICD-10-CM | POA: Diagnosis present

## 2021-03-23 DIAGNOSIS — R579 Shock, unspecified: Secondary | ICD-10-CM

## 2021-03-23 DIAGNOSIS — Z6841 Body Mass Index (BMI) 40.0 and over, adult: Secondary | ICD-10-CM | POA: Diagnosis not present

## 2021-03-23 DIAGNOSIS — N1831 Chronic kidney disease, stage 3a: Secondary | ICD-10-CM | POA: Diagnosis present

## 2021-03-23 DIAGNOSIS — D62 Acute posthemorrhagic anemia: Secondary | ICD-10-CM | POA: Diagnosis present

## 2021-03-23 DIAGNOSIS — N179 Acute kidney failure, unspecified: Secondary | ICD-10-CM | POA: Diagnosis present

## 2021-03-23 DIAGNOSIS — K7200 Acute and subacute hepatic failure without coma: Secondary | ICD-10-CM | POA: Diagnosis present

## 2021-03-23 DIAGNOSIS — Z515 Encounter for palliative care: Secondary | ICD-10-CM | POA: Diagnosis not present

## 2021-03-23 DIAGNOSIS — G40901 Epilepsy, unspecified, not intractable, with status epilepticus: Secondary | ICD-10-CM | POA: Diagnosis present

## 2021-03-23 DIAGNOSIS — I214 Non-ST elevation (NSTEMI) myocardial infarction: Secondary | ICD-10-CM | POA: Diagnosis present

## 2021-03-23 DIAGNOSIS — E1142 Type 2 diabetes mellitus with diabetic polyneuropathy: Secondary | ICD-10-CM | POA: Diagnosis present

## 2021-03-23 DIAGNOSIS — R578 Other shock: Secondary | ICD-10-CM | POA: Diagnosis present

## 2021-03-23 DIAGNOSIS — Z20822 Contact with and (suspected) exposure to covid-19: Secondary | ICD-10-CM | POA: Diagnosis present

## 2021-03-23 DIAGNOSIS — E872 Acidosis: Secondary | ICD-10-CM | POA: Diagnosis present

## 2021-03-23 DIAGNOSIS — G9341 Metabolic encephalopathy: Secondary | ICD-10-CM | POA: Diagnosis present

## 2021-03-23 DIAGNOSIS — E1122 Type 2 diabetes mellitus with diabetic chronic kidney disease: Secondary | ICD-10-CM | POA: Diagnosis present

## 2021-03-23 DIAGNOSIS — E039 Hypothyroidism, unspecified: Secondary | ICD-10-CM | POA: Diagnosis present

## 2021-03-23 HISTORY — PX: IR TRANSCATH/EMBOLIZ: IMG695

## 2021-03-23 LAB — CBC WITH DIFFERENTIAL/PLATELET
Abs Immature Granulocytes: 3.15 10*3/uL — ABNORMAL HIGH (ref 0.00–0.07)
Basophils Absolute: 0.1 10*3/uL (ref 0.0–0.1)
Basophils Relative: 0 %
Eosinophils Absolute: 0.1 10*3/uL (ref 0.0–0.5)
Eosinophils Relative: 0 %
HCT: 23.4 % — ABNORMAL LOW (ref 36.0–46.0)
Hemoglobin: 6.4 g/dL — ABNORMAL LOW (ref 12.0–15.0)
Immature Granulocytes: 12 %
Lymphocytes Relative: 19 %
Lymphs Abs: 4.9 10*3/uL — ABNORMAL HIGH (ref 0.7–4.0)
MCH: 27.8 pg (ref 26.0–34.0)
MCHC: 27.4 g/dL — ABNORMAL LOW (ref 30.0–36.0)
MCV: 101.7 fL — ABNORMAL HIGH (ref 80.0–100.0)
Monocytes Absolute: 1.4 10*3/uL — ABNORMAL HIGH (ref 0.1–1.0)
Monocytes Relative: 5 %
Neutro Abs: 16.6 10*3/uL — ABNORMAL HIGH (ref 1.7–7.7)
Neutrophils Relative %: 64 %
Platelets: 297 10*3/uL (ref 150–400)
RBC: 2.3 MIL/uL — ABNORMAL LOW (ref 3.87–5.11)
RDW: 18 % — ABNORMAL HIGH (ref 11.5–15.5)
WBC: 26 10*3/uL — ABNORMAL HIGH (ref 4.0–10.5)
nRBC: 0.2 % (ref 0.0–0.2)

## 2021-03-23 LAB — BLOOD GAS, ARTERIAL
Acid-base deficit: 20.7 mmol/L — ABNORMAL HIGH (ref 0.0–2.0)
Acid-base deficit: 22.5 mmol/L — ABNORMAL HIGH (ref 0.0–2.0)
Bicarbonate: 8 mmol/L — ABNORMAL LOW (ref 20.0–28.0)
Bicarbonate: 9 mmol/L — ABNORMAL LOW (ref 20.0–28.0)
FIO2: 0.4
FIO2: 1
MECHVT: 500 mL
MECHVT: 500 mL
Mechanical Rate: 16
O2 Saturation: 98.5 %
O2 Saturation: 99.9 %
PEEP: 5 cmH2O
PEEP: 5 cmH2O
Patient temperature: 37
Patient temperature: 37
RATE: 16 resp/min
pCO2 arterial: 34 mmHg (ref 32.0–48.0)
pCO2 arterial: 39 mmHg (ref 32.0–48.0)
pH, Arterial: 6.92 — CL (ref 7.350–7.450)
pH, Arterial: 7.03 — CL (ref 7.350–7.450)
pO2, Arterial: 159 mmHg — ABNORMAL HIGH (ref 83.0–108.0)
pO2, Arterial: 443 mmHg — ABNORMAL HIGH (ref 83.0–108.0)

## 2021-03-23 LAB — PREPARE RBC (CROSSMATCH)

## 2021-03-23 LAB — COMPREHENSIVE METABOLIC PANEL
ALT: 1042 U/L — ABNORMAL HIGH (ref 0–44)
ALT: 898 U/L — ABNORMAL HIGH (ref 0–44)
AST: 1210 U/L — ABNORMAL HIGH (ref 15–41)
AST: 1823 U/L — ABNORMAL HIGH (ref 15–41)
Albumin: 2 g/dL — ABNORMAL LOW (ref 3.5–5.0)
Albumin: 2.6 g/dL — ABNORMAL LOW (ref 3.5–5.0)
Alkaline Phosphatase: 106 U/L (ref 38–126)
Alkaline Phosphatase: 126 U/L (ref 38–126)
Anion gap: 28 — ABNORMAL HIGH (ref 5–15)
Anion gap: 30 — ABNORMAL HIGH (ref 5–15)
BUN: 46 mg/dL — ABNORMAL HIGH (ref 8–23)
BUN: 54 mg/dL — ABNORMAL HIGH (ref 8–23)
CO2: 11 mmol/L — ABNORMAL LOW (ref 22–32)
CO2: 12 mmol/L — ABNORMAL LOW (ref 22–32)
Calcium: 6.5 mg/dL — ABNORMAL LOW (ref 8.9–10.3)
Calcium: 8.7 mg/dL — ABNORMAL LOW (ref 8.9–10.3)
Chloride: 100 mmol/L (ref 98–111)
Chloride: 98 mmol/L (ref 98–111)
Creatinine, Ser: 2.37 mg/dL — ABNORMAL HIGH (ref 0.44–1.00)
Creatinine, Ser: 2.72 mg/dL — ABNORMAL HIGH (ref 0.44–1.00)
GFR, Estimated: 19 mL/min — ABNORMAL LOW (ref >60)
GFR, Estimated: 22 mL/min — ABNORMAL LOW (ref >60)
Glucose, Bld: 292 mg/dL — ABNORMAL HIGH (ref 70–99)
Glucose, Bld: 355 mg/dL — ABNORMAL HIGH (ref 70–99)
Potassium: 3.2 mmol/L — ABNORMAL LOW (ref 3.5–5.1)
Potassium: 4.1 mmol/L (ref 3.5–5.1)
Sodium: 138 mmol/L (ref 135–145)
Sodium: 141 mmol/L (ref 135–145)
Total Bilirubin: 0.9 mg/dL (ref 0.3–1.2)
Total Bilirubin: 1.5 mg/dL — ABNORMAL HIGH (ref 0.3–1.2)
Total Protein: 4.8 g/dL — ABNORMAL LOW (ref 6.5–8.1)
Total Protein: 6.8 g/dL (ref 6.5–8.1)

## 2021-03-23 LAB — CBC
HCT: 23.8 % — ABNORMAL LOW (ref 36.0–46.0)
Hemoglobin: 7.4 g/dL — ABNORMAL LOW (ref 12.0–15.0)
MCH: 28.8 pg (ref 26.0–34.0)
MCHC: 31.1 g/dL (ref 30.0–36.0)
MCV: 92.6 fL (ref 80.0–100.0)
Platelets: 187 10*3/uL (ref 150–400)
RBC: 2.57 MIL/uL — ABNORMAL LOW (ref 3.87–5.11)
RDW: 17.7 % — ABNORMAL HIGH (ref 11.5–15.5)
WBC: 60.6 10*3/uL (ref 4.0–10.5)
nRBC: 0.1 % (ref 0.0–0.2)

## 2021-03-23 LAB — DIC (DISSEMINATED INTRAVASCULAR COAGULATION)PANEL
D-Dimer, Quant: 11.41 ug/mL-FEU — ABNORMAL HIGH (ref 0.00–0.50)
Fibrinogen: 279 mg/dL (ref 210–475)
INR: 2.3 — ABNORMAL HIGH (ref 0.8–1.2)
Platelets: 173 10*3/uL (ref 150–400)
Prothrombin Time: 25.1 seconds — ABNORMAL HIGH (ref 11.4–15.2)
Smear Review: NONE SEEN
aPTT: 71 seconds — ABNORMAL HIGH (ref 24–36)

## 2021-03-23 LAB — ECHOCARDIOGRAM COMPLETE
AR max vel: 1.89 cm2
AV Area VTI: 1.98 cm2
AV Area mean vel: 1.97 cm2
AV Mean grad: 2.5 mmHg
AV Peak grad: 4.4 mmHg
Ao pk vel: 1.05 m/s
Area-P 1/2: 3.19 cm2
Height: 65 in
S' Lateral: 3.14 cm
Weight: 4522.08 oz

## 2021-03-23 LAB — BRAIN NATRIURETIC PEPTIDE: B Natriuretic Peptide: 160.5 pg/mL — ABNORMAL HIGH (ref 0.0–100.0)

## 2021-03-23 LAB — GLUCOSE, CAPILLARY
Glucose-Capillary: 377 mg/dL — ABNORMAL HIGH (ref 70–99)
Glucose-Capillary: 311 mg/dL — ABNORMAL HIGH (ref 70–99)
Glucose-Capillary: 289 mg/dL — ABNORMAL HIGH (ref 70–99)
Glucose-Capillary: 201 mg/dL — ABNORMAL HIGH (ref 70–99)
Glucose-Capillary: 158 mg/dL — ABNORMAL HIGH (ref 70–99)

## 2021-03-23 LAB — HIV ANTIBODY (ROUTINE TESTING W REFLEX): HIV Screen 4th Generation wRfx: NONREACTIVE

## 2021-03-23 LAB — MRSA NEXT GEN BY PCR, NASAL: MRSA by PCR Next Gen: NOT DETECTED

## 2021-03-23 LAB — BETA-HYDROXYBUTYRIC ACID: Beta-Hydroxybutyric Acid: 0.33 mmol/L — ABNORMAL HIGH (ref 0.05–0.27)

## 2021-03-23 LAB — LACTIC ACID, PLASMA
Lactic Acid, Venous: >11 mmol/L (ref 0.5–1.9)
Lactic Acid, Venous: >11 mmol/L (ref 0.5–1.9)
Lactic Acid, Venous: >11 mmol/L (ref 0.5–1.9)

## 2021-03-23 LAB — HEMOGLOBIN AND HEMATOCRIT, BLOOD
HCT: 25 % — ABNORMAL LOW (ref 36.0–46.0)
Hemoglobin: 8.1 g/dL — ABNORMAL LOW (ref 12.0–15.0)

## 2021-03-23 LAB — HEMOGLOBIN A1C
Hgb A1c MFr Bld: 6 % — ABNORMAL HIGH (ref 4.8–5.6)
Mean Plasma Glucose: 125.5 mg/dL

## 2021-03-23 LAB — RESP PANEL BY RT-PCR (FLU A&B, COVID) ARPGX2
Influenza A by PCR: NEGATIVE
Influenza B by PCR: NEGATIVE
SARS Coronavirus 2 by RT PCR: NEGATIVE

## 2021-03-23 LAB — MASSIVE TRANSFUSION PROTOCOL ORDER (BLOOD BANK NOTIFICATION)

## 2021-03-23 LAB — PROTIME-INR
INR: 1.4 — ABNORMAL HIGH (ref 0.8–1.2)
Prothrombin Time: 17.3 seconds — ABNORMAL HIGH (ref 11.4–15.2)

## 2021-03-23 LAB — TROPONIN I (HIGH SENSITIVITY)
Troponin I (High Sensitivity): 59 ng/L — ABNORMAL HIGH (ref <18)
Troponin I (High Sensitivity): 218 ng/L (ref <18)
Troponin I (High Sensitivity): 392 ng/L (ref <18)

## 2021-03-23 LAB — PROCALCITONIN: Procalcitonin: 0.21 ng/mL

## 2021-03-23 LAB — MAGNESIUM: Magnesium: 2.7 mg/dL — ABNORMAL HIGH (ref 1.7–2.4)

## 2021-03-23 LAB — SALICYLATE LEVEL: Salicylate Lvl: 7.1 mg/dL (ref 7.0–30.0)

## 2021-03-23 LAB — APTT: aPTT: 62 seconds — ABNORMAL HIGH (ref 24–36)

## 2021-03-23 LAB — ETHANOL: Alcohol, Ethyl (B): <10 mg/dL (ref <10)

## 2021-03-23 LAB — ACETAMINOPHEN LEVEL: Acetaminophen (Tylenol), Serum: <10 ug/mL — ABNORMAL LOW (ref 10–30)

## 2021-03-23 MED ORDER — ACETAMINOPHEN 325 MG PO TABS: 650.0000 mg | ORAL_TABLET | Freq: Four times a day (QID) | ORAL | Status: DC | PRN

## 2021-03-23 MED ORDER — GLYCOPYRROLATE 0.2 MG/ML IJ SOLN: 0.2000 mg | INTRAMUSCULAR | Status: DC | PRN

## 2021-03-23 MED ORDER — IPRATROPIUM-ALBUTEROL 0.5-2.5 (3) MG/3ML IN SOLN: 3.0000 mL | Freq: Four times a day (QID) | RESPIRATORY_TRACT | Status: DC | PRN

## 2021-03-23 MED ORDER — ORAL CARE MOUTH RINSE
15.0000 mL | OROMUCOSAL | Status: DC
  Administered 2021-03-23 (×2): 15 mL via OROMUCOSAL

## 2021-03-23 MED ORDER — PERFLUTREN LIPID MICROSPHERE
1.0000 mL | INTRAVENOUS | Status: AC | PRN
  Administered 2021-03-23: 3 mL via INTRAVENOUS
  Filled 2021-03-23: qty 10

## 2021-03-23 MED ORDER — INSULIN ASPART 100 UNIT/ML IJ SOLN
0.0000 [IU] | INTRAMUSCULAR | Status: DC
Start: 2021-03-23 — End: 2021-03-23
  Administered 2021-03-23: 7 [IU] via SUBCUTANEOUS
  Administered 2021-03-23: 11 [IU] via SUBCUTANEOUS
  Administered 2021-03-23: 7 [IU] via SUBCUTANEOUS
  Filled 2021-03-23 (×4): qty 1

## 2021-03-23 MED ORDER — LEVETIRACETAM IN NACL 500 MG/100ML IV SOLN: 500.0000 mg | Freq: Two times a day (BID) | INTRAVENOUS | Status: DC

## 2021-03-23 MED ORDER — CHLORHEXIDINE GLUCONATE 0.12% ORAL RINSE (MEDLINE KIT)
15.0000 mL | Freq: Two times a day (BID) | OROMUCOSAL | Status: DC
  Administered 2021-03-23: 15 mL via OROMUCOSAL

## 2021-03-23 MED ORDER — IOHEXOL 350 MG/ML SOLN
100.0000 mL | Freq: Once | INTRAVENOUS | Status: AC | PRN
  Administered 2021-03-23: 100 mL via INTRAVENOUS

## 2021-03-23 MED ORDER — SODIUM BICARBONATE 8.4 % IV SOLN
INTRAVENOUS | Status: AC
  Administered 2021-03-23: 100 meq via INTRAVENOUS
  Filled 2021-03-23: qty 100

## 2021-03-23 MED ORDER — DEXTROSE 5 % IV SOLN: INTRAVENOUS | Status: DC

## 2021-03-23 MED ORDER — POLYETHYLENE GLYCOL 3350 17 G PO PACK: 17.0000 g | PACK | Freq: Every day | ORAL | Status: DC | PRN

## 2021-03-23 MED ORDER — SODIUM CHLORIDE 0.9 % IV SOLN: INTRAVENOUS | Status: AC

## 2021-03-23 MED ORDER — SODIUM BICARBONATE 8.4 % IV SOLN: 100.0000 meq | Freq: Once | INTRAVENOUS | Status: AC

## 2021-03-23 MED ORDER — PIPERACILLIN-TAZOBACTAM 3.375 G IVPB: 3.3750 g | Freq: Three times a day (TID) | INTRAVENOUS | Status: DC

## 2021-03-23 MED ORDER — NOREPINEPHRINE 16 MG/250ML-% IV SOLN
0.0000 ug/min | INTRAVENOUS | Status: DC
  Administered 2021-03-23: 40 ug/min via INTRAVENOUS
  Administered 2021-03-23 (×2): 125 ug/min via INTRAVENOUS
  Administered 2021-03-23: 80 ug/min via INTRAVENOUS
  Administered 2021-03-23 (×2): 125 ug/min via INTRAVENOUS
  Filled 2021-03-23 (×7): qty 250

## 2021-03-23 MED ORDER — SODIUM BICARBONATE 8.4 % IV SOLN
100.0000 meq | Freq: Once | INTRAVENOUS | Status: AC
  Administered 2021-03-23: 100 meq via INTRAVENOUS
  Filled 2021-03-23: qty 100

## 2021-03-23 MED ORDER — HYDROCORTISONE NA SUCCINATE PF 100 MG IJ SOLR
100.0000 mg | Freq: Two times a day (BID) | INTRAMUSCULAR | Status: DC
  Administered 2021-03-23: 100 mg via INTRAVENOUS
  Filled 2021-03-23: qty 2

## 2021-03-23 MED ORDER — SODIUM CHLORIDE 0.9% FLUSH: 10.0000 mL | INTRAVENOUS | Status: DC | PRN

## 2021-03-23 MED ORDER — GLYCOPYRROLATE 1 MG PO TABS
1.0000 mg | ORAL_TABLET | ORAL | Status: DC | PRN
  Filled 2021-03-23: qty 1

## 2021-03-23 MED ORDER — DOCUSATE SODIUM 50 MG/5ML PO LIQD: 100.0000 mg | Freq: Two times a day (BID) | ORAL | Status: DC | PRN

## 2021-03-23 MED ORDER — PHENYLEPHRINE CONCENTRATED 100MG/250ML (0.4 MG/ML) INFUSION SIMPLE
0.0000 ug/min | INTRAVENOUS | Status: DC
  Administered 2021-03-23 (×4): 400 ug/min via INTRAVENOUS
  Filled 2021-03-23 (×5): qty 250

## 2021-03-23 MED ORDER — SODIUM CHLORIDE 0.9 % IV SOLN
20.0000 mg/kg | Freq: Once | INTRAVENOUS | Status: AC
  Administered 2021-03-23: 2740 mg via INTRAVENOUS
  Filled 2021-03-23: qty 27.4

## 2021-03-23 MED ORDER — SODIUM CHLORIDE 0.9% IV SOLUTION: Freq: Once | INTRAVENOUS | Status: DC

## 2021-03-23 MED ORDER — CHLORHEXIDINE GLUCONATE CLOTH 2 % EX PADS
6.0000 | MEDICATED_PAD | Freq: Every day | CUTANEOUS | Status: DC
  Administered 2021-03-23: 6 via TOPICAL

## 2021-03-23 MED ORDER — MORPHINE SULFATE (PF) 2 MG/ML IV SOLN
2.0000 mg | INTRAVENOUS | Status: DC | PRN
  Administered 2021-03-23: 4 mg via INTRAVENOUS
  Filled 2021-03-23: qty 2

## 2021-03-23 MED ORDER — EPINEPHRINE HCL 5 MG/250ML IV SOLN IN NS
0.5000 ug/min | INTRAVENOUS | Status: DC
  Administered 2021-03-23: 0.5 ug/min via INTRAVENOUS
  Administered 2021-03-23 (×2): 20 ug/min via INTRAVENOUS
  Filled 2021-03-23 (×4): qty 250

## 2021-03-23 MED ORDER — NOREPINEPHRINE 4 MG/250ML-% IV SOLN
0.0000 ug/min | INTRAVENOUS | Status: DC
  Administered 2021-03-22: 15 ug/min via INTRAVENOUS
  Filled 2021-03-23 (×3): qty 250

## 2021-03-23 MED ORDER — SODIUM CHLORIDE 0.9% IV SOLUTION: Freq: Once | INTRAVENOUS | Status: AC

## 2021-03-23 MED ORDER — STERILE WATER FOR INJECTION IV SOLN
INTRAVENOUS | Status: DC
  Filled 2021-03-23: qty 1000
  Filled 2021-03-23: qty 150
  Filled 2021-03-23 (×2): qty 1000
  Filled 2021-03-23: qty 150
  Filled 2021-03-23: qty 1000

## 2021-03-23 MED ORDER — DOCUSATE SODIUM 100 MG PO CAPS: 100.0000 mg | ORAL_CAPSULE | Freq: Two times a day (BID) | ORAL | Status: DC | PRN

## 2021-03-23 MED ORDER — INSULIN ASPART 100 UNIT/ML IJ SOLN
10.0000 [IU] | Freq: Once | INTRAMUSCULAR | Status: DC
  Filled 2021-03-23: qty 1

## 2021-03-23 MED ORDER — MIDAZOLAM HCL 2 MG/2ML IJ SOLN: 2.0000 mg | INTRAMUSCULAR | Status: DC | PRN

## 2021-03-23 MED ORDER — DIPHENHYDRAMINE HCL 50 MG/ML IJ SOLN: 25.0000 mg | INTRAMUSCULAR | Status: DC | PRN

## 2021-03-23 MED ORDER — POLYVINYL ALCOHOL 1.4 % OP SOLN
1.0000 [drp] | Freq: Four times a day (QID) | OPHTHALMIC | Status: DC | PRN
  Filled 2021-03-23: qty 15

## 2021-03-23 MED ORDER — EPINEPHRINE 1 MG/10ML IJ SOSY
PREFILLED_SYRINGE | INTRAMUSCULAR | Status: AC | PRN
  Administered 2021-03-22 (×2): 1 mg via INTRAVENOUS

## 2021-03-23 MED ORDER — PANTOPRAZOLE SODIUM 40 MG IV SOLR: 40.0000 mg | Freq: Every day | INTRAVENOUS | Status: DC

## 2021-03-23 MED ORDER — ACETAMINOPHEN 650 MG RE SUPP: 650.0000 mg | Freq: Four times a day (QID) | RECTAL | Status: DC | PRN

## 2021-03-23 MED ORDER — INSULIN ASPART 100 UNIT/ML IJ SOLN
0.0000 [IU] | INTRAMUSCULAR | Status: DC
  Administered 2021-03-23: 15 [IU] via SUBCUTANEOUS
  Filled 2021-03-23: qty 1

## 2021-03-23 MED ORDER — POTASSIUM CHLORIDE 10 MEQ/50ML IV SOLN
10.0000 meq | INTRAVENOUS | Status: AC
  Administered 2021-03-23 (×2): 10 meq via INTRAVENOUS
  Filled 2021-03-23 (×2): qty 50

## 2021-03-23 MED ORDER — VASOPRESSIN 20 UNITS/100 ML INFUSION FOR SHOCK
0.0000 [IU]/min | INTRAVENOUS | Status: DC
  Administered 2021-03-23: 0.04 [IU]/min via INTRAVENOUS
  Administered 2021-03-23: 0.03 [IU]/min via INTRAVENOUS
  Filled 2021-03-23 (×5): qty 100

## 2021-03-23 MED ORDER — SODIUM CHLORIDE 0.9% FLUSH
10.0000 mL | Freq: Two times a day (BID) | INTRAVENOUS | Status: DC
Start: 2021-03-23 — End: 2021-03-24

## 2021-03-24 LAB — BLOOD CULTURE ID PANEL (REFLEXED) - BCID2

## 2021-03-24 LAB — PREPARE FRESH FROZEN PLASMA

## 2021-03-24 LAB — BPAM RBC
Blood Product Expiration Date: 202208172359
Blood Product Expiration Date: 202208172359
Blood Product Expiration Date: 202208172359
Blood Product Expiration Date: 202208192359
Blood Product Expiration Date: 202208242359
Blood Product Expiration Date: 202209132359
Blood Product Expiration Date: 202209132359
ISSUE DATE / TIME: 202208150210
ISSUE DATE / TIME: 202208150210
ISSUE DATE / TIME: 202208150210
ISSUE DATE / TIME: 202208150352
ISSUE DATE / TIME: 202208160158
ISSUE DATE / TIME: 202208160856
ISSUE DATE / TIME: 202208160933
Unit Type and Rh: 5100
Unit Type and Rh: 5100
Unit Type and Rh: 5100
Unit Type and Rh: 5100
Unit Type and Rh: 5100
Unit Type and Rh: 5100
Unit Type and Rh: 9500

## 2021-03-24 LAB — BPAM FFP
Blood Product Expiration Date: 202208202359
Blood Product Expiration Date: 202208202359
Blood Product Expiration Date: 202208202359
Blood Product Expiration Date: 202208202359
ISSUE DATE / TIME: 202208150245
ISSUE DATE / TIME: 202208150245
ISSUE DATE / TIME: 202208150245
ISSUE DATE / TIME: 202208150245
Unit Type and Rh: 5100
Unit Type and Rh: 5100
Unit Type and Rh: 5100
Unit Type and Rh: 5100

## 2021-03-24 LAB — TYPE AND SCREEN
ABO/RH(D): O POS
Antibody Screen: NEGATIVE
Unit division: 0
Unit division: 0
Unit division: 0
Unit division: 0
Unit division: 0
Unit division: 0
Unit division: 0

## 2021-03-24 LAB — PREPARE RBC (CROSSMATCH)

## 2021-03-26 LAB — CULTURE, BLOOD (ROUTINE X 2): Special Requests: ADEQUATE

## 2021-03-28 LAB — CULTURE, BLOOD (ROUTINE X 2)
Culture: NO GROWTH
Special Requests: ADEQUATE

## 2021-04-09 NOTE — Sepsis Progress Note (Signed)
[facility] following for Sepsis Protocol 

## 2021-04-09 NOTE — Progress Notes (Signed)
Patient transported to Vascular Lab on transport ventilator for procedure.  No acute events. Returned to ICU without incident. Returned to previous vent settings.
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
## 2021-04-09 NOTE — Progress Notes (Signed)
PHARMACY CONSULT NOTE - FOLLOW UP  Pharmacy Consult for Electrolyte Monitoring and Replacement   Recent Labs: Potassium (mmol/L)  Date Value  04/21/2021 3.2 (L)   Magnesium (mg/dL)  Date Value  03/26/2021 2.7 (H)   Calcium (mg/dL)  Date Value  Apr 21, 2021 6.5 (L)   Albumin (g/dL)  Date Value  04/21/21 2.0 (L)   Phosphorus (mg/dL)  Date Value  11/21/2017 2.4 (L)   Sodium (mmol/L)  Date Value  April 21, 2021 141     Assessment: 65 year old female presented following seizure activity. In the ED, patient with cardiac arrest requiring approximately 5 minutes of CPR per ED provider. Patient is in the ICU on mechanical ventilation and requiring multiple vasopressors for blood pressure support s/t hemorrhagic shock due to abdominal hematoma. She is s/p coil embolization with IR. Currently with AKI and elevated LFTs.  Goal of Therapy:  Electrolytes WNL  Plan:  --Continues on bicarb drip --K 3.2 (likely s/t above) - 10 mEq IV x 2 runs --Cautious replacement given renal function --Continue to follow along  Tawnya Crook ,PharmD, BCPS Clinical Pharmacist 21-Apr-2021 11:26 AM

## 2021-04-09 NOTE — Code Documentation (Signed)
Pulse check, pt with ROSC noted at this time.

## 2021-04-09 NOTE — Progress Notes (Signed)
GOALS OF CARE DISCUSSION  The Clinical status was relayed to family in detail. Niece, sister at bedside  Updated and notified of patients medical condition. Multiple pressors, severe shock Multiorgan failure   Patient remains unresponsive and will not open eyes to command.   Patient is having a weak cough and struggling to remove secretions.   Patient with increased WOB and using accessory muscles to breathe Explained to family course of therapy and the modalities    Patient with Progressive multiorgan failure with a very high probablity of a very minimal chance of meaningful recovery despite all aggressive and optimal medical therapy.  PATIENT REMAINS DNR status  Family understands the situation.  They have consented and agreed to DNR/DNI and would like to proceed with Comfort care measures.  Family are satisfied with Plan of action and management. All questions answered  Additional CC time 35 mins   Kevin Mario Patricia Pesa, M.D.  [facility]  Medical Director [place] Director [facility] Cardio-Pulmonary Department

## 2021-04-09 NOTE — Consult Note (Signed)
Chief Complaint: Hemorrhagic Shock  Referring Physician(s): Dr. Tamala Julian  Supervising Physician: Corrie Mckusick  Patient Status: [facility] - ED  History of Present Illness: Andrea Rivers is a 65 y.o. female presenting to [place] service in hemorrhagic shock, secondary to extraperitoneal bleeding.   DNR at this time.   History is gained from the team/electronic records as the patient is intubated.  [name] apparently complained of nausea and general unwellness then became unresponsive with what was interpreted as "seizure activity" by her niece.  She lives in assisted living/SNIF. She was transported by EMS to ED.    While in the ED she became unresponsive requiring intubation.  Labs showed anemia, with lactic acidosis.  2 rounds of CPR were required with epi.  ROCS was achieved.    CT of CAP performed showing large anterior abdominal wall extraperitoneal hemorrhage.  There was report of "active bleeding" however, the protocol is not CTA.    At time of my assessment she is in ICU bed 15.  Receiving 3rd unit of PRBC's.  Right arm cuff pressure of 130 SBP max pressor support.  No sedation required with no corneal reflexes reported.   Labs: WBC: 26 H&H: 6.4/23.4 Platelets: 297 BNP: 160 LA: >11  Cr: 2.7 BUN 54 AST: 1210 ALT: 898 Tbili: 0.9 INR: 1.4 PTT: 17.3   Past Medical History:  Diagnosis Date   Anemia    Anxiety    CHF (congestive heart failure) (HCC)    Chronic kidney disease    INSUFFICIENCY   Complication of anesthesia    had to be intubated during cataract surgery   " I could not breath "   COPD (chronic obstructive pulmonary disease) (HCC)    Diabetes mellitus without complication ([place])    Dyspnea    DOE   Dysrhythmia    A FIB   Edema    Fracture of distal femur ([place]) 11/2017   left    GERD (gastroesophageal reflux disease)    History of kidney stones    Hypertension    Hypothyroidism    ABLATION   Iron deficiency anemia 03/31/2017   Neuropathy     Orthopnea    Sleep apnea    CPAP   Vertigo    Wheezing     Past Surgical History:  Procedure Laterality Date   CATARACT EXTRACTION W/PHACO Left 01/26/2017   Procedure: CATARACT EXTRACTION PHACO AND INTRAOCULAR LENS PLACEMENT ([place]);  Surgeon: Estill Cotta, MD;  Location: [facility] ORS;  Service: Ophthalmology;  Laterality: Left;  [place]   1:02.2 AP     23.7 CDE   28.92 fluid pack lot# [id number] H exp.05/08/2018   CHOLECYSTECTOMY     ESOPHAGOGASTRODUODENOSCOPY N/A 10/12/2018   Procedure: ESOPHAGOGASTRODUODENOSCOPY (EGD);  Surgeon: Toledo, Benay Pike, MD;  Location: [facility] ENDOSCOPY;  Service: Gastroenterology;  Laterality: N/A;   ORIF FEMUR FRACTURE Left 11/17/2017   Procedure: OPEN REDUCTION INTERNAL FIXATION (ORIF) DISTAL FEMUR FRACTURE;  Surgeon: Shona Needles, MD;  Location: [place];  Service: Orthopedics;  Laterality: Left;   TONSILLECTOMY      Allergies: Other, Latex, Exenatide, Garlic, Onion, Rosiglitazone, and Erythromycin  Medications: Prior to Admission medications   Medication Sig Start Date End Date Taking? Authorizing Provider  insulin regular (NOVOLIN R) 100 units/mL injection Inject into the skin. 12/25/20 12/25/21 Yes [provider]  acetaminophen (TYLENOL) 500 MG tablet Take 1,000 mg by mouth 2 (two) times daily as needed for mild pain, fever or headache.     [provider]  albuterol (  PROAIR HFA) 108 (90 Base) MCG/ACT inhaler Inhale 2 puffs into the lungs every 4 (four) hours as needed for wheezing or shortness of breath.  03/14/17   [provider]  ALLERGY RELIEF 10 MG tablet Take 10 mg by mouth daily. 03/17/21   [provider]  amiodarone (PACERONE) 200 MG tablet Take 200 mg by mouth daily. 05/10/17   [provider]  Biotin 10 MG CAPS Take 1 capsule by mouth daily.     [provider]  busPIRone (BUSPAR) 10 MG tablet Take 10 mg by mouth daily. 03/17/21   [provider]  busPIRone (BUSPAR) 5 MG tablet Take 5 mg by  mouth 2 (two) times daily. 12/03/19   [provider]  calcium carbonate (TUMS - DOSED IN MG ELEMENTAL CALCIUM) 500 MG chewable tablet Chew 1 tablet by mouth daily. 03/16/21   [provider]  Calcium Carbonate-Vitamin D3 (CALCIUM 600-D) 600-400 MG-UNIT TABS Take 1 tablet by mouth 3 (three) times daily with meals. Patient reports taking twice daily    [provider]  cetirizine (ZYRTEC) 10 MG tablet Take 10 mg by mouth daily.    [provider]  Cholecalciferol (D3 HIGH POTENCY) 125 MCG (5000 UT) capsule Take 5,000 Units by mouth daily.     [provider]  Cholecalciferol (VITAMIN D3) 50 MCG (2000 UT) TABS Take 1 tablet by mouth daily. 03/17/21   [provider]  citalopram (CELEXA) 40 MG tablet Take 40 mg by mouth daily.     [provider]  clonazePAM (KLONOPIN) 1 MG tablet Take 1 tablet by mouth 2 (two) times daily as needed for anxiety.  05/17/18   [provider]  cloNIDine (CATAPRES) 0.1 MG tablet Take 1 tablet (0.1 mg total) by mouth daily. 02/06/19   Darylene Price A, FNP  Co-Enzyme Q-10 100 MG CAPS Take 100 mg by mouth daily.     [provider]  cyclobenzaprine (FLEXERIL) 5 MG tablet Take 5 mg by mouth 3 (three) times daily as needed for muscle spasms.    [provider]  docusate sodium (COLACE) 50 MG capsule Take 100-200 mg by mouth 2 (two) times daily. '200mg'$  in the morning and '100mg'$  in the evening    [provider]  enoxaparin (LOVENOX) 150 MG/ML injection Inject into the skin. 03/17/21   [provider]  febuxostat (ULORIC) 40 MG tablet Take 40 mg by mouth daily.     [provider]  fluticasone (FLONASE) 50 MCG/ACT nasal spray SHAKE LQ AND U 1 SPR IEN QD 12/04/18   [provider]  Fluticasone-Salmeterol (WIXELA INHUB) 250-50 MCG/DOSE AEPB Inhale 1 puff into the lungs 2 (two) times daily. Patient reports taking only PRN    [provider]  furosemide (LASIX) 40  MG tablet Take 40 mg by mouth 2 (two) times daily. 03/17/21   [provider]  gabapentin (NEURONTIN) 100 MG capsule Take 100 mg by mouth 3 (three) times daily.    [provider]  Garlic 123XX123 MG CAPS Take 1,000 mg by mouth daily.     [provider]  glimepiride (AMARYL) 2 MG tablet Take 2 mg by mouth daily with breakfast.    [provider]  HYDROcodone-acetaminophen (NORCO) 10-325 MG tablet Take 1 tablet by mouth every 6 (six) hours as needed for severe pain.     [provider]  Insulin Detemir (LEVEMIR FLEXTOUCH) 100 UNIT/ML Pen Inject 23 Units into the skin at bedtime.  03/01/18  [provider]  insulin glargine-yfgn (SEMGLEE) 100 UNIT/ML Pen Inject 30 Units into the skin daily. 03/16/21   [provider]  insulin lispro (HUMALOG) 100 UNIT/ML injection Inject 15-20 Units into the skin 3 (three) times daily before meals. Up to 80units a day    [provider]  levothyroxine (SYNTHROID) 100 MCG tablet Take 100 mcg by mouth daily. 03/17/21   [provider]  levothyroxine (SYNTHROID) 75 MCG tablet Take 75 mcg by mouth daily. 03/17/21   [provider]  levothyroxine (SYNTHROID, LEVOTHROID) 175 MCG tablet Take 175 mcg by mouth every evening.     [provider]  linagliptin (TRADJENTA) 5 MG TABS tablet take 1 tablet by mouth once daily 01/03/18   [provider]  LORazepam (ATIVAN) 1 MG tablet Take by mouth. 03/16/21   [provider]  losartan (COZAAR) 25 MG tablet Take 1 tablet (25 mg total) by mouth daily. 02/06/19 05/29/20  Alisa Graff, FNP  lovastatin (MEVACOR) 20 MG tablet Take 20 mg by mouth at bedtime.    [provider]  magnesium oxide (MAG-OX) 400 MG tablet Take 400 mg by mouth 2 (two) times daily.    [provider]  meclizine (ANTIVERT) 25 MG tablet Take 25 mg by mouth daily as needed. 03/18/21   [provider]  metolazone (ZAROXOLYN) 5 MG tablet  Take 5 mg by mouth as needed (for a weight gain of 2 pounds overnight or 5 pounds in a week; MAX OF 5 TABLETS AT A TIME).  04/18/17   [provider]  mirtazapine (REMERON) 15 MG tablet Take 15 mg by mouth at bedtime. 03/17/21   [provider]  Multiple Vitamins-Minerals (CENTRUM WOMEN) TABS Take 1 tablet by mouth daily.     [provider]  Omega-3 Fatty Acids (FISH OIL) 1000 MG CPDR Take 1 capsule by mouth daily.    [provider]  pantoprazole (PROTONIX) 40 MG tablet Take 1 tablet (40 mg total) by mouth 2 (two) times daily before a meal. 10/12/18   Fritzi Mandes, MD  polyethylene glycol (MIRALAX) 17 g packet Take 17 g by mouth daily. 06/23/19   Lannie Fields, PA-C  potassium chloride SA (KLOR-CON) 20 MEQ tablet TAKE 1 TABLET BY MOUTH TWICE DAILY AND DOUBLE DOSE WHEN TAKING METOLAZOLE 03/13/20   Darylene Price A, FNP  senna (SENOKOT) 8.6 MG TABS tablet Take 8.6-17.2 mg by mouth 2 (two) times daily. 17.'2mg'$  (2 tablets) in the morning and 8.'6mg'$  in the evening    [provider]  torsemide (DEMADEX) 20 MG tablet TAKE 2 TABLETS BY MOUTH TWICE DAILY Patient taking differently: 40 mg 2 (two) times daily.  05/21/20   Alisa Graff, FNP  traMADol (ULTRAM) 50 MG tablet Take 1 tablet (50 mg total) by mouth every 6 (six) hours as needed for moderate pain. 10/22/19   Johnn Hai, PA-C  vitamin C (ASCORBIC ACID) 500 MG tablet Take 500 mg by mouth daily.    [provider]  warfarin (COUMADIN) 5 MG tablet Take 3.5 mg by mouth daily at 6 PM.  04/22/17   [provider]     Family History  Problem Relation Age of Onset   COPD Mother    Heart disease Mother    Anemia Mother    Heart disease Father    COPD Father    Anemia Sister    Diabetes Maternal Grandmother    Hypertension Paternal Grandfather     Social History  Socioeconomic History   Marital status: Widowed    Spouse name: Not on file   Number of children: Not on file   Years of  education: Not on file   Highest education level: Not on file  Occupational History   Occupation: retired  Tobacco Use   Smoking status: Former    Packs/day: 3.00    Types: Cigarettes    Quit date: 1988    Years since quitting: 34.6   Smokeless tobacco: Never  Vaping Use   Vaping Use: Never used  Substance and Sexual Activity   Alcohol use: No   Drug use: No   Sexual activity: Not Currently  Other Topics Concern   Not on file  Social History Narrative   Not on file   Social Determinants of Health   Financial Resource Strain: Not on file  Food Insecurity: Not on file  Transportation Needs: Not on file  Physical Activity: Not on file  Stress: Not on file  Social Connections: Not on file       Review of Systems: A 12 point ROS discussed and pertinent positives are indicated in the HPI above.  All other systems are negative.  Review of Systems  Vital Signs: BP (!) 79/55   Pulse 84   Temp (!) 93.9 F (34.4 C)   Resp (!) 0   Ht '5\' 5"'$  (1.651 m)   Wt 128.2 kg   SpO2 100%   BMI 47.03 kg/m   Physical Exam General: Critical, unresponsive, intubated 65yo female in ICU 15.  HEENT: Atraumatic, normocephalic.  Intubated.   Neck: full neck Chest/Lungs:  Symmetric chest with ventilation Heart:   . No JVD appreciated.  Abdomen:  Obese, some ecchymosis of the lower abdominal wall. Large pannus.  Genito-urinary: Deferred Neurologic: Unresponsive.    .   Imaging: CT HEAD WO CONTRAST (5MM)  Result Date: Apr 13, 2021 CLINICAL DATA:  Seizure EXAM: CT HEAD WITHOUT CONTRAST TECHNIQUE: Contiguous axial images were obtained from the base of the skull through the vertex without intravenous contrast. COMPARISON:  None. FINDINGS: Brain: There is no mass, hemorrhage or extra-axial collection. The size and configuration of the ventricles and extra-axial CSF spaces are normal. The brain parenchyma is normal, without acute or chronic infarction. Vascular: No abnormal hyperdensity of the  major intracranial arteries or dural venous sinuses. No intracranial atherosclerosis. Skull: The visualized skull base, calvarium and extracranial soft tissues are normal. Sinuses/Orbits: No fluid levels or advanced mucosal thickening of the visualized paranasal sinuses. No mastoid or middle ear effusion. The orbits are normal. IMPRESSION: Normal head CT. Electronically Signed   By: Ulyses Jarred M.D.   On: 13-Apr-2021 01:52   CT Angio Chest PE W and/or Wo Contrast  Result Date: 04-13-21 CLINICAL DATA:  Status post seizure and CPR. EXAM: CT ANGIOGRAPHY CHEST WITH CONTRAST TECHNIQUE: Multidetector CT imaging of the chest was performed using the standard protocol during bolus administration of intravenous contrast. Multiplanar CT image reconstructions and MIPs were obtained to evaluate the vascular anatomy. CONTRAST:  188m OMNIPAQUE IOHEXOL 350 MG/ML SOLN COMPARISON:  None. FINDINGS: Cardiovascular: There is moderate severity calcification of the aortic arch. Satisfactory opacification of the pulmonary arteries to the segmental level. No evidence of pulmonary embolism. Normal heart size with moderate severity coronary artery calcification. No pericardial effusion. Mediastinum/Nodes: No enlarged mediastinal, hilar, or axillary lymph nodes. Thyroid gland, trachea, and esophagus demonstrate no significant findings. Lungs/Pleura: Endotracheal and nasogastric tubes are in place. Mild to moderate severity linear scarring and/or atelectasis is seen  within the posterior aspects of the bilateral lung bases, right greater than left. A small right pleural effusion is also seen. There is no evidence of a pneumothorax. Upper Abdomen: Multiple surgical clips are seen within the gallbladder fossa. Musculoskeletal: Multiple bilateral metallic density pedicle screws are seen at the levels of T7 through L1. Extensive chronic and degenerative changes are also seen. Acute anterolateral second through sixth right ribs are seen.  Chronic anterior seventh, eighth and ninth right rib fractures are noted. A fracture deformity of indeterminate age is noted involving the body of the sternum. Additional chronic fracture deformities are seen involving multiple bilateral posterior and anterior ribs. Review of the MIP images confirms the above findings. IMPRESSION: 1. No evidence of pulmonary embolism. 2. Mild to moderate severity posterior bibasilar linear scarring and/or atelectasis. 3. Small right pleural effusion. 4. Multiple acute right rib fractures and numerous bilateral chronic rib fractures. 5. Sternal fracture of indeterminate age. This may be secondary to the patient's history of recent CPR. 6. Extensive postoperative changes throughout the thoracolumbar spine. 7. Evidence of prior cholecystectomy. Electronically Signed   By: Virgina Norfolk M.D.   On: 04-18-2021 01:49   CT ABDOMEN PELVIS W CONTRAST  Result Date: 04-18-2021 CLINICAL DATA:  Status post seizure and recent CPR. EXAM: CT ABDOMEN AND PELVIS WITH CONTRAST TECHNIQUE: Multidetector CT imaging of the abdomen and pelvis was performed using the standard protocol following bolus administration of intravenous contrast. CONTRAST:  146m OMNIPAQUE IOHEXOL 350 MG/ML SOLN COMPARISON:  May 09, 2018 FINDINGS: Lower chest: Mild to moderate severity areas of scarring and/or atelectasis are seen within the bilateral lung bases, right greater than left. A small right pleural effusion is also seen. Hepatobiliary: There is diffuse fatty infiltration of the liver parenchyma. No focal liver abnormality is seen. Status post cholecystectomy. No biliary dilatation. Pancreas: Unremarkable. No pancreatic ductal dilatation or surrounding inflammatory changes. Spleen: Normal in size without focal abnormality. A stable splenule is seen adjacent to the anterior aspect of the spleen. Adrenals/Urinary Tract: Adrenal glands are unremarkable. Kidneys are normal in size, without renal calculi or  hydronephrosis. A 2.7 cm diameter simple renal cyst is seen within the upper pole of the right kidney. A similar appearing 1.6 cm diameter simple cyst is seen within the upper pole of the left kidney. A Foley catheter is seen within an empty urinary bladder. Stomach/Bowel: A nasogastric tube is seen within the lumen of an otherwise normal appearing stomach. Appendix appears normal. Stool is seen throughout the large bowel. No evidence of bowel wall thickening, distention, or inflammatory changes. Vascular/Lymphatic: Aortic atherosclerosis. The distal portion of a right femoral venous catheter is seen (axial CT images 86 through 101). No enlarged abdominal or pelvic lymph nodes. Reproductive: The uterus is poorly visualized and appears to be small in size. The adnexa are unremarkable. Other: A very large area of mildly increased attenuation (approximately 28.6 Hounsfield units) is seen within the subcutaneous fat of the anterior pelvic wall. This measures approximately 30.8 cm x 6.8 cm and extends superiorly to involve the anterolateral aspect of the lower left abdominal wall. The inferior portion of this area is not included in the field of view. A mild-to-moderate amount of adjacent, predominately anterior subcutaneous inflammatory fat stranding is seen. Thin curvilinear areas of contrast attenuation are noted on the right (axial CT images 80 through 82 and 95 through 100, CT series 2) No abdominopelvic ascites. Musculoskeletal: Multilevel degenerative changes seen throughout the lumbar spine. IMPRESSION: 1. Extremely large hematoma within the  subcutaneous fat of the anterior pelvic wall, with additional findings suggestive of active bleeding. 2. Bibasilar scarring and/or atelectasis, right greater than left. 3. Small right pleural effusion. 4. Evidence of prior cholecystectomy. 5. Extensive postoperative changes within the lower thoracic and upper lumbar spine. Electronically Signed   By: Virgina Norfolk M.D.    On: Apr 10, 2021 02:12   DG Chest Port 1 View  Result Date: 03/27/2021 CLINICAL DATA:  Questionable sepsis - evaluate for abnormality Endotracheal and orogastric tube placement. EXAM: PORTABLE CHEST 1 VIEW COMPARISON:  Chest radiograph 05/25/2020 FINDINGS: Endotracheal tube tip is 2.7 cm from the carina. Enteric tube is in place. The tip is not included in the field of view and appears to be below the diaphragm. Lung volumes are low. Stable heart size and mediastinal contours. Bibasilar atelectasis. No pulmonary edema, pleural effusion, or pneumothorax. Thoracic spinal fusion hardware is new from prior exam. Right lateral rib fractures are age indeterminate. IMPRESSION: 1. Endotracheal tube tip 2.7 cm from the carina. Enteric tube tip appears to be below the diaphragm, but is not included in the field of view. 2. Low lung volumes with bibasilar atelectasis. 3. Right lateral rib fractures are age indeterminate. Electronically Signed   By: Keith Rake M.D.   On: 04/06/2021 23:28    Labs:  CBC: Recent Labs    03/31/20 1600 05/25/20 2328 03/15/2021 2350  WBC 16.3* 13.7* 26.0*  HGB 12.1 11.7* 6.4*  HCT 37.4 38.5 23.4*  PLT 207 201 297    COAGS: Recent Labs    03/12/2021 2350  INR 1.4*  APTT 62*    BMP: Recent Labs    05/25/20 2328 03/20/2021 2340  NA 141 138  K 3.3* 4.1  CL 95* 98  CO2 32 12*  GLUCOSE 242* 355*  BUN 70* 54*  CALCIUM 8.5* 8.7*  CREATININE 2.17* 2.72*  GFRNONAA 23* 19*    LIVER FUNCTION TESTS: Recent Labs    03/17/2021 2340  BILITOT 0.9  AST 1,210*  ALT 898*  ALKPHOS 126  PROT 6.8  ALBUMIN 2.6*    TUMOR MARKERS: No results for input(s): AFPTM, CEA, CA199, CHROMGRNA in the last 8760 hours.  Assessment and Plan:  Ms Mouch is 65 yo female critically ill, maximum pressor support, SP 2 rounds of CPR/epi with ROSC, and anterior abdominal wall extraperitoneal hemorrhage, as source of hemorrhagic shock.    Discussed with ED and Critical Care teams, and  agree that her condition is likely worsened by neurologic injury, potentially anoxic injury secondary to cardiac arrest.    Despite what we perceive as poor prognosis, her family, her niece, does believe that she would want everything done at this point, with the exception of additional CPR.    My impression of the CT is that it is non-diagnostic of actual source of hemorrhage, with the most likely source being the right inferior epigastric artery arcade.  Option of conservative measures/observation carries with it the possibility of ongoing hemorrhage with futile resuscitative efforts.  Option of open exploratory surgery is most likely non-survivable and morbid.    Angiogram and possible embolization would be the only option at this point.    I did discuss the poor prognosis and the logistics, risks, benefits of this with her niece, who gives her verbal consent to proceed.  Risks and benefits include: bleeding, infection, arterial injury, including acute limb ischemia, further surgery/procedure, vascular injury or contrast induced renal failure, cardiopulmonary collapse, death.   All questions were answered, with plan to proceed.  Consent signed and in chart.  Thank you for this interesting consult.  I greatly enjoyed meeting AANIKA TAUSCHER and look forward to participating in their care.  A copy of this report was sent to the requesting provider on this date.  Electronically Signed: Corrie Mckusick, DO March 27, 2021, 3:52 AM   I spent a total of 80 Minutes    in face to face in clinical consultation, greater than 50% of which was counseling/coordinating care for hemorrhagic shock,  possible angiogram and embolization

## 2021-04-09 NOTE — Progress Notes (Signed)
Chaplain Maggie responded to page to ED2 and then [place] to ED2. No family present. Contact on call chaplain at [phone] for continued support.

## 2021-04-09 NOTE — Consult Note (Signed)
Pharmacy Antibiotic Note  Andrea Rivers is a 65 y.o. female w/ h/o DM, CHF, brought to hospital in PEA 2/2 hemorrhagic shock from abdominal hematoma. Pt admitted on 04/02/2021 and treated empirically with c/f  Intra-abdominal Infection .  Pharmacy has been consulted for Zosyn dosing.  Plan: PCT 0.21; lactate >11 x2; WBC 26>60; multi-organ failure ISO above will need to monitor renal fxn closely. Zosyn 3.375g q8h   Height: '5\' 5"'$  (165.1 cm) Weight: 128.2 kg (282 lb 10.1 oz) IBW/kg (Calculated) : 57  Temp (24hrs), Avg:94.1 F (34.5 C), Min:92 F (33.3 C), Max:96.8 F (36 C)  Recent Labs  Lab 04/08/2021 2340 03/12/2021 2350 2021-04-07 0820 04/07/2021 1549  WBC  --  26.0*  --  60.6*  CREATININE 2.72*  --  2.37*  --   LATICACIDVEN  --  >11.0* >11.0* >11.0*    Estimated Creatinine Clearance: 31.9 mL/min (A) (by C-G formula based on SCr of 2.37 mg/dL (H)).    Allergies  Allergen Reactions   Other Other (See Comments)    ILOSONE- Caused GI distress also   Latex Hives   Exenatide Nausea Only and Other (See Comments)    Byetta- Nausea and abdominal pain, also   Garlic Other (See Comments)    Severe acid reflux   Onion Other (See Comments)    Severe acid reflux   Rosiglitazone Other (See Comments)    Avandia- Affected heart   Erythromycin Nausea And Vomiting    GI DISTRESS    Antimicrobials this admission: VAN/CFP/MTZ x1 in ED  (8/14 PM) Zosyn (8/15 >>  Dose adjustments this admission: Will CTM and adjust PRN (Scr 2.72>2.37; ~31.7m/min CrCl)  Microbiology results: 8/15 BCx: Pending 8/15 UCx: Pending  8/15 Flu/Covid: negative  8/15 MRSA PCR: negative  Thank you for allowing pharmacy to be a part of this patient's care.  BShanon BrowBeers,RPh,BCCP [id number] PM

## 2021-04-09 NOTE — Progress Notes (Addendum)
Inpatient Diabetes Program Recommendations  AACE/ADA: New Consensus Statement on Inpatient Glycemic Control (2015)  Target Ranges:  Prepandial:   less than 140 mg/dL      Peak postprandial:   less than 180 mg/dL (1-2 hours)      Critically ill patients:  140 - 180 mg/dL   Results for CAMREIGH, BOBBITT (MRN [id number]) as of Apr 22, 2021 09:50  Ref. Range 03/29/2021 23:17 22-Apr-2021 03:24 04-22-2021 06:06 2021/04/22 08:17  Glucose-Capillary Latest Ref Range: 70 - 99 mg/dL 324 (H) 377 (H)  15 units NOVOLOG  311 (H) 289 (H)  11 units NOVOLOG     Admit: Sudden Cardiac arrest: initial rhythm PEA in the setting of hemorrhagic shock Hemorrhagic shock due to Abdominal hematoma Anemia due to Acute blood loss now with multiorgan failure Severe shock and acidosis  History: DM, CKD, COPD  SNF DM Meds: Amaryl 2 mg daily     Levemir 23 units QHS     Humalog 15-20 units TID with meals     Tradjenta 5 mg daily     See Endocrinology notes 07/07/2020  Current Orders: Novolog Resistant Correction Scale/ SSI (0-20 units) Q4 hours    Getting Solucortef 100 mg BID Currently on Vent Unresponsive   MD- Note patient takes basal insulin at the SNF.  Getting steroids.  If within goals of care, please consider starting Levemir 12 units BID (0.2 units/kg)    --Will follow patient during hospitalization--  Wyn Quaker RN, MSN, CDE Diabetes Coordinator Inpatient Glycemic Control Team Team Pager: [phone] (8a-5p)

## 2021-04-09 NOTE — ED Notes (Signed)
Warmer therapy applied

## 2021-04-09 NOTE — Death Summary Note (Signed)
DEATH SUMMARY   Patient Details  Name: Andrea Rivers MRN: [id number] DOB: Oct 17, 1955  Admission/Discharge Information   Admit Date:  2021/04/13  Date of Death: Date of Death: Apr 14, 2021  Time of Death: Time of Death: 11/17/99  Length of Stay: 0  Referring Physician: Donnald Garre, MD   Reason(s) for Hospitalization  Cardiac Arrest (PEA) Hemorrhagic shock Abdominal Hematoma Acute Blood Loss Anemia Anion Gap Metabolic Acidosis Lactic Acidosis Acute Kidney Injury on CKD Stage IIIa Acute Hypoxic Respiratory Failure Seizures Shock Liver Elevated Troponin Diabetes Mellitus Type II  Diagnoses  Preliminary cause of death:  Cardiac Arrest (PEA) in setting of Hemorrhagic Shock Secondary Diagnoses (including complications and co-morbidities):  Active Problems:   Cardiac arrest (HCC) Hemorrhagic shock Abdominal Hematoma Acute Blood Loss Anemia Anion Gap Metabolic Acidosis Lactic Acidosis Acute Kidney Injury on CKD Stage IIIa Acute Hypoxic Respiratory Failure Seizures Shock Liver Elevated Troponin Diabetes Mellitus Type II  Brief Hospital Course (including significant findings, care, treatment, and services provided and events leading to death)  SELENY SMULLEN is a 65 y.o. year old female who arrived at [facility] ED from an SNF due to concerns for new onset seizure activity.  Per ED documentation and report, EMS reported being called to the facility for concerns regarding seizure activity and nausea.  EMS reported witnessing 2 additional episodes of seizure-like activity.  Upon arrival to the ED nursing staff reported what appeared to be focal like seizure activity with a left gaze preference & tremors, otherwise the patient was unresponsive & hypotensive ED course: The patient was emergently intubated requiring mechanical ventilation upon arrival for airway protection in the setting of ongoing suspected seizures.  Directly after intubation patient lost her pulse with initial rhythm  of PEA.  She received 2 rounds of CPR prior to achieving ROSC.  A central line was placed emergently, Levophed & vasopressin drips initiated for hypotension with SBP in the 50s.  Initial vitals: Hypothermic at 94.7, RR 20, HR 90, BP 52/45 & SPO2 100% on FiO2 of 60 (initially on nonrebreather at 15 L) Significant labs: Acute anemia: hgb 6.4 (on 8/6 was [id number]), AG metabolic acidosis- serum CO2 12/ AG 28, shock liver: AST 1210/ ALT 898, AKI on CKD: BUN/Cr 54/2.72, BNP elevated 160.5, troponin elevated: 59, severe lactic acidosis > 11, PCT 0.21, leukocytosis 26, INR 1.4, acetaminophen level [id number], salicylate level 7.1, COVID/flu negative 2021/04/14 CTH without contrast> negative for any intracranial abnormality 04/14/21 CT angio chest > negative for pulmonary embolism, mild to moderate severity posterior bibasilar linear scarring and/or atelectasis, small right pleural effusion.  Right rib fractures and numerous bilateral chronic rib fractures, sternal fracture of indeterminate age Apr 14, 2021 CT abdomen and pelvis without contrast > extremely large hematoma within the subcutaneous fat of the anterior pelvic wall with additional findings suggestive of active bleeding.    Dr. Tamala Julian spoke with the patient's niece Thornton Dales who is the patient's next of kin confirming that the patient would want to be a DNR, CODE STATUS changed. Patient worked up with sepsis protocol receiving 2 L normal saline IV fluid and empiric antibiotics including cefepime and vancomycin.  3 units of emergent PRBCs were ordered due to acute anemia and concerns for an active bleed.   Dr. Tamala Julian called and spoke with Dr. Earleen Newport from IR after being alerted to large abdominal hematoma.  MTP protocol ordered and  PCCM consulted to admit.  She was taken emergently by IR for Angiogram, and the source of hemorrhage was identified on angio  as the inferior Epigastric Artery.  Coil Embolization was performed.  She returned to ICU and remained  hypotensive despite 4 vasopressors at maximum dose, and refractory acidosis despite bicarbonate infusion.  She developed multiorgan failure.  Neurologic exam was concerning for severe anoxic brain injury (dilated and fixed pupils, no cough/gag reflex, and no corneal reflex.  Later in the afternoon the patient's family elected to transition to [place].  She expired shortly after life sustaining measures withdrawn.    Pertinent Labs and Studies  Significant Diagnostic Studies CT HEAD WO CONTRAST (5MM)  Result Date: 04/05/2021 CLINICAL DATA:  Seizure EXAM: CT HEAD WITHOUT CONTRAST TECHNIQUE: Contiguous axial images were obtained from the base of the skull through the vertex without intravenous contrast. COMPARISON:  None. FINDINGS: Brain: There is no mass, hemorrhage or extra-axial collection. The size and configuration of the ventricles and extra-axial CSF spaces are normal. The brain parenchyma is normal, without acute or chronic infarction. Vascular: No abnormal hyperdensity of the major intracranial arteries or dural venous sinuses. No intracranial atherosclerosis. Skull: The visualized skull base, calvarium and extracranial soft tissues are normal. Sinuses/Orbits: No fluid levels or advanced mucosal thickening of the visualized paranasal sinuses. No mastoid or middle ear effusion. The orbits are normal. IMPRESSION: Normal head CT. Electronically Signed   By: Ulyses Jarred M.D.   On: 04-05-2021 01:52   CT Angio Chest PE W and/or Wo Contrast  Result Date: 2021/04/05 CLINICAL DATA:  Status post seizure and CPR. EXAM: CT ANGIOGRAPHY CHEST WITH CONTRAST TECHNIQUE: Multidetector CT imaging of the chest was performed using the standard protocol during bolus administration of intravenous contrast. Multiplanar CT image reconstructions and MIPs were obtained to evaluate the vascular anatomy. CONTRAST:  134m OMNIPAQUE IOHEXOL 350 MG/ML SOLN COMPARISON:  None. FINDINGS: Cardiovascular: There is  moderate severity calcification of the aortic arch. Satisfactory opacification of the pulmonary arteries to the segmental level. No evidence of pulmonary embolism. Normal heart size with moderate severity coronary artery calcification. No pericardial effusion. Mediastinum/Nodes: No enlarged mediastinal, hilar, or axillary lymph nodes. Thyroid gland, trachea, and esophagus demonstrate no significant findings. Lungs/Pleura: Endotracheal and nasogastric tubes are in place. Mild to moderate severity linear scarring and/or atelectasis is seen within the posterior aspects of the bilateral lung bases, right greater than left. A small right pleural effusion is also seen. There is no evidence of a pneumothorax. Upper Abdomen: Multiple surgical clips are seen within the gallbladder fossa. Musculoskeletal: Multiple bilateral metallic density pedicle screws are seen at the levels of T7 through L1. Extensive chronic and degenerative changes are also seen. Acute anterolateral second through sixth right ribs are seen. Chronic anterior seventh, eighth and ninth right rib fractures are noted. A fracture deformity of indeterminate age is noted involving the body of the sternum. Additional chronic fracture deformities are seen involving multiple bilateral posterior and anterior ribs. Review of the MIP images confirms the above findings. IMPRESSION: 1. No evidence of pulmonary embolism. 2. Mild to moderate severity posterior bibasilar linear scarring and/or atelectasis. 3. Small right pleural effusion. 4. Multiple acute right rib fractures and numerous bilateral chronic rib fractures. 5. Sternal fracture of indeterminate age. This may be secondary to the patient's history of recent CPR. 6. Extensive postoperative changes throughout the thoracolumbar spine. 7. Evidence of prior cholecystectomy. Electronically Signed   By: TVirgina NorfolkM.D.   On: 008-28-2201:49   CT ABDOMEN PELVIS W CONTRAST  Result Date: 808/28/22CLINICAL  DATA:  Status post seizure and recent CPR. EXAM: CT  ABDOMEN AND PELVIS WITH CONTRAST TECHNIQUE: Multidetector CT imaging of the abdomen and pelvis was performed using the standard protocol following bolus administration of intravenous contrast. CONTRAST:  147m OMNIPAQUE IOHEXOL 350 MG/ML SOLN COMPARISON:  May 09, 2018 FINDINGS: Lower chest: Mild to moderate severity areas of scarring and/or atelectasis are seen within the bilateral lung bases, right greater than left. A small right pleural effusion is also seen. Hepatobiliary: There is diffuse fatty infiltration of the liver parenchyma. No focal liver abnormality is seen. Status post cholecystectomy. No biliary dilatation. Pancreas: Unremarkable. No pancreatic ductal dilatation or surrounding inflammatory changes. Spleen: Normal in size without focal abnormality. A stable splenule is seen adjacent to the anterior aspect of the spleen. Adrenals/Urinary Tract: Adrenal glands are unremarkable. Kidneys are normal in size, without renal calculi or hydronephrosis. A 2.7 cm diameter simple renal cyst is seen within the upper pole of the right kidney. A similar appearing 1.6 cm diameter simple cyst is seen within the upper pole of the left kidney. A Foley catheter is seen within an empty urinary bladder. Stomach/Bowel: A nasogastric tube is seen within the lumen of an otherwise normal appearing stomach. Appendix appears normal. Stool is seen throughout the large bowel. No evidence of bowel wall thickening, distention, or inflammatory changes. Vascular/Lymphatic: Aortic atherosclerosis. The distal portion of a right femoral venous catheter is seen (axial CT images 86 through 101). No enlarged abdominal or pelvic lymph nodes. Reproductive: The uterus is poorly visualized and appears to be small in size. The adnexa are unremarkable. Other: A very large area of mildly increased attenuation (approximately 28.6 Hounsfield units) is seen within the subcutaneous fat of the  anterior pelvic wall. This measures approximately 30.8 cm x 6.8 cm and extends superiorly to involve the anterolateral aspect of the lower left abdominal wall. The inferior portion of this area is not included in the field of view. A mild-to-moderate amount of adjacent, predominately anterior subcutaneous inflammatory fat stranding is seen. Thin curvilinear areas of contrast attenuation are noted on the right (axial CT images 80 through 82 and 95 through 100, CT series 2) No abdominopelvic ascites. Musculoskeletal: Multilevel degenerative changes seen throughout the lumbar spine. IMPRESSION: 1. Extremely large hematoma within the subcutaneous fat of the anterior pelvic wall, with additional findings suggestive of active bleeding. 2. Bibasilar scarring and/or atelectasis, right greater than left. 3. Small right pleural effusion. 4. Evidence of prior cholecystectomy. 5. Extensive postoperative changes within the lower thoracic and upper lumbar spine. Electronically Signed   By: TVirgina NorfolkM.D.   On: 008-30-2202:12   DG Chest Port 1 View  Result Date: 03/16/2021 CLINICAL DATA:  Questionable sepsis - evaluate for abnormality Endotracheal and orogastric tube placement. EXAM: PORTABLE CHEST 1 VIEW COMPARISON:  Chest radiograph 05/25/2020 FINDINGS: Endotracheal tube tip is 2.7 cm from the carina. Enteric tube is in place. The tip is not included in the field of view and appears to be below the diaphragm. Lung volumes are low. Stable heart size and mediastinal contours. Bibasilar atelectasis. No pulmonary edema, pleural effusion, or pneumothorax. Thoracic spinal fusion hardware is new from prior exam. Right lateral rib fractures are age indeterminate. IMPRESSION: 1. Endotracheal tube tip 2.7 cm from the carina. Enteric tube tip appears to be below the diaphragm, but is not included in the field of view. 2. Low lung volumes with bibasilar atelectasis. 3. Right lateral rib fractures are age indeterminate.  Electronically Signed   By: MKeith RakeM.D.   On: 03/30/2021 23:28  ECHOCARDIOGRAM COMPLETE  Result Date: 2021/04/15    ECHOCARDIOGRAM REPORT   Patient Name:   INSIYAH HANOVER Date of Exam: 2021-04-15 Medical Rec #:  [id number]        Height:       65.0 in Accession #:    [id number]       Weight:       282.6 lb Date of Birth:  [birth year]       BSA:          2.291 m Patient Age:    65 years         BP:           67/40 mmHg Patient Gender: F                HR:           75 bpm. Exam Location:  [facility] Procedure: 2D Echo and Intracardiac Opacification Agent Indications:     Cardiac Arrest  History:         Patient has prior history of Echocardiogram examinations. CHF,                  COPD, Arrythmias:Atrial Fibrillation; Risk Factors:Hypertension                  and Morbid Obesity, CKD.  Sonographer:     L Thornton-Maynard Referring Phys:  [id number] BRITTON L RUST-CHESTER Diagnosing Phys: Serafina Royals MD  Sonographer Comments: Technically difficult study due to poor echo windows and patient is morbidly obese. IMPRESSIONS  1. Left ventricular ejection fraction, by estimation, is 55 to 60%. The left ventricle has normal function. The left ventricle has no regional wall motion abnormalities. Left ventricular diastolic parameters were normal.  2. Right ventricular systolic function is normal. The right ventricular size is normal.  3. The mitral valve is normal in structure. Trivial mitral valve regurgitation.  4. The aortic valve is normal in structure. Aortic valve regurgitation is not visualized. FINDINGS  Left Ventricle: Left ventricular ejection fraction, by estimation, is 55 to 60%. The left ventricle has normal function. The left ventricle has no regional wall motion abnormalities. Definity contrast agent was given IV to delineate the left ventricular  endocardial borders. The left ventricular internal cavity size was small. There is no left ventricular hypertrophy. Left ventricular diastolic parameters  were normal. Right Ventricle: The right ventricular size is normal. No increase in right ventricular wall thickness. Right ventricular systolic function is normal. Left Atrium: Left atrial size was normal in size. Right Atrium: Right atrial size was normal in size. Pericardium: There is no evidence of pericardial effusion. Mitral Valve: The mitral valve is normal in structure. Trivial mitral valve regurgitation. Tricuspid Valve: The tricuspid valve is normal in structure. Tricuspid valve regurgitation is trivial. Aortic Valve: The aortic valve is normal in structure. Aortic valve regurgitation is not visualized. Aortic valve mean gradient measures 2.5 mmHg. Aortic valve peak gradient measures 4.4 mmHg. Aortic valve area, by VTI measures 1.98 cm. Pulmonic Valve: The pulmonic valve was not assessed. Pulmonic valve regurgitation is not visualized. Aorta: The aortic root and ascending aorta are structurally normal, with no evidence of dilitation. IAS/Shunts: No atrial level shunt detected by color flow Doppler.  LEFT VENTRICLE PLAX 2D LVIDd:         3.85 cm  Diastology LVIDs:         3.14 cm  LV e' medial:    4.22 cm/s LV PW:  1.32 cm  LV E/e' medial:  10.3 LV IVS:        1.33 cm  LV e' lateral:   2.76 cm/s LVOT diam:     1.90 cm  LV E/e' lateral: 15.7 LV SV:         24 LV SV Index:   10 LVOT Area:     2.84 cm  RIGHT VENTRICLE RV S prime:     10.90 cm/s LEFT ATRIUM             Index LA diam:        2.70 cm 1.18 cm/m LA Vol (A2C):   49.2 ml 21.47 ml/m LA Vol (A4C):   49.4 ml 21.56 ml/m LA Biplane Vol: 50.7 ml 22.13 ml/m  AORTIC VALVE                   PULMONIC VALVE AV Area (Vmax):    1.89 cm    PV Vmax:       0.88 m/s AV Area (Vmean):   1.97 cm    PV Peak grad:  3.1 mmHg AV Area (VTI):     1.98 cm AV Vmax:           104.70 cm/s AV Vmean:          70.350 cm/s AV VTI:            0.120 m AV Peak Grad:      4.4 mmHg AV Mean Grad:      2.5 mmHg LVOT Vmax:         69.80 cm/s LVOT Vmean:        48.900 cm/s LVOT  VTI:          0.084 m LVOT/AV VTI ratio: 0.70  AORTA Ao Root diam: 3.00 cm MITRAL VALVE MV Area (PHT): 3.19 cm    SHUNTS MV E velocity: 43.30 cm/s  Systemic VTI:  0.08 m MV A velocity: 44.60 cm/s  Systemic Diam: 1.90 cm MV E/A ratio:  0.97 Serafina Royals MD Electronically signed by Serafina Royals MD Signature Date/Time: 04-11-21/12:48:35 PM    Final     Microbiology Recent Results (from the past 240 hour(s))  Blood Culture (routine x 2)     Status: None (Preliminary result)   Collection Time: 03/16/2021 11:00 PM   Specimen: BLOOD  Result Value Ref Range Status   Specimen Description BLOOD RIGHT ANTECUBITAL  Final   Special Requests   Final    BOTTLES DRAWN AEROBIC AND ANAEROBIC Blood Culture adequate volume   Culture   Final    NO GROWTH < 24 HOURS Performed at [facility], [address], [place], [place] [address]    Report Status PENDING  Incomplete  Resp Panel by RT-PCR (Flu A&B, Covid) Nasopharyngeal Swab     Status: None   Collection Time: 03/18/2021 11:45 PM   Specimen: Nasopharyngeal Swab; Nasopharyngeal(NP) swabs in vial transport medium  Result Value Ref Range Status   SARS Coronavirus 2 by RT PCR NEGATIVE NEGATIVE Final    Comment: (NOTE) SARS-CoV-2 target nucleic acids are NOT DETECTED.  The SARS-CoV-2 RNA is generally detectable in upper respiratory specimens during the acute phase of infection. The lowest concentration of SARS-CoV-2 viral copies this assay can detect is 138 copies/mL. A negative result does not preclude SARS-Cov-2 infection and should not be used as the sole basis for treatment or other patient management decisions. A negative result may occur with  improper specimen collection/handling, submission of specimen other  than nasopharyngeal swab, presence of viral mutation(s) within the areas targeted by this assay, and inadequate number of viral copies(<138 copies/mL). A negative result must be combined with clinical observations, patient history,  and epidemiological information. The expected result is Negative.  Fact Sheet for Patients:  EntrepreneurPulse.com.au  Fact Sheet for Healthcare Providers:  IncredibleEmployment.be  This test is no t yet approved or cleared by the [place] FDA and  has been authorized for detection and/or diagnosis of SARS-CoV-2 by FDA under an Emergency Use Authorization (EUA). This EUA will remain  in effect (meaning this test can be used) for the duration of the COVID-19 declaration under Section 564(b)(1) of the Act, 21 U.S.C.section 360bbb-3(b)(1), unless the authorization is terminated  or revoked sooner.       Influenza A by PCR NEGATIVE NEGATIVE Final   Influenza B by PCR NEGATIVE NEGATIVE Final    Comment: (NOTE) The Xpert Xpress SARS-CoV-2/FLU/RSV plus assay is intended as an aid in the diagnosis of influenza from Nasopharyngeal swab specimens and should not be used as a sole basis for treatment. Nasal washings and aspirates are unacceptable for Xpert Xpress SARS-CoV-2/FLU/RSV testing.  Fact Sheet for Patients: EntrepreneurPulse.com.au  Fact Sheet for Healthcare Providers: IncredibleEmployment.be  This test is not yet approved or cleared by the [place] FDA and has been authorized for detection and/or diagnosis of SARS-CoV-2 by FDA under an Emergency Use Authorization (EUA). This EUA will remain in effect (meaning this test can be used) for the duration of the COVID-19 declaration under Section 564(b)(1) of the Act, 21 U.S.C. section 360bbb-3(b)(1), unless the authorization is terminated or revoked.  Performed at [facility], [address] [place], [place] [address]   Blood Culture (routine x 2)     Status: None (Preliminary result)   Collection Time: 03/19/2021 11:45 PM   Specimen: BLOOD  Result Value Ref Range Status   Specimen Description BLOOD LEFT ANTECUBITAL  Final   Special  Requests   Final    BOTTLES DRAWN AEROBIC AND ANAEROBIC Blood Culture adequate volume   Culture   Final    NO GROWTH < 24 HOURS Performed at [facility], [address] [place], [place] [address]    Report Status PENDING  Incomplete  MRSA Next Gen by PCR, Nasal     Status: None   Collection Time: 04/02/21  3:15 AM   Specimen: Nasal Mucosa; Nasal Swab  Result Value Ref Range Status   MRSA by PCR Next Gen NOT DETECTED NOT DETECTED Final    Comment: (NOTE) The GeneXpert MRSA Assay (FDA approved for NASAL specimens only), is one component of a comprehensive MRSA colonization surveillance program. It is not intended to diagnose MRSA infection nor to guide or monitor treatment for MRSA infections. Test performance is not FDA approved in patients less than 16 years old. Performed at [facility], [address] [place], [place] [address]     Lab Basic Metabolic Panel: Recent Labs  Lab 03/16/2021 2340 03/21/2021 2350 04/02/2021 0820  NA 138  --  141  K 4.1  --  3.2*  CL 98  --  100  CO2 12*  --  11*  GLUCOSE 355*  --  292*  BUN 54*  --  46*  CREATININE 2.72*  --  2.37*  CALCIUM 8.7*  --  6.5*  MG  --  2.7*  --    Liver Function Tests: Recent Labs  Lab 03/19/2021 2340 April 02, 2021 0820  AST 1,210* 1,823*  ALT 898* 1,042*  ALKPHOS 126 106  BILITOT 0.9 1.5*  PROT 6.8 4.8*  ALBUMIN 2.6* 2.0*   No results for input(s): LIPASE, AMYLASE in the last 168 hours. No results for input(s): AMMONIA in the last 168 hours. CBC: Recent Labs  Lab 03/13/2021 2350 03-25-21 0820 03/25/2021 1549  WBC 26.0*  --  60.6*  NEUTROABS 16.6*  --   --   HGB 6.4* 8.1* 7.4*  HCT 23.4* 25.0* 23.8*  MCV 101.7*  --  92.6  PLT 297 173 187   Cardiac Enzymes: No results for input(s): CKTOTAL, CKMB, CKMBINDEX, TROPONINI in the last 168 hours. Sepsis Labs: Recent Labs  Lab 03/18/2021 2350 03/25/2021 0820 2021/03/25 1549  PROCALCITON 0.21  --   --   WBC 26.0*  --  60.6*  LATICACIDVEN  >11.0* >11.0* >11.0*    Procedures/Operations  03/17/2021: Endotracheal intubation 04/05/2021: Right femoral CVC placed in ED March 25, 2021: Angiogram with Coil Embolization of Right Inferior Epigastric artery      Darel Hong, AGACNP-BC [facility] Prefer epic messenger for cross cover needs If after hours, please call E-link  Bradly Bienenstock 25-Mar-2021, 6:06 PM

## 2021-04-09 NOTE — Progress Notes (Signed)
Pt transported to ICU 15 on the vent without incident. Pt remains on the vent. Report given to ICU RT.

## 2021-04-09 NOTE — Progress Notes (Signed)
Pt extubated to comfort care.  

## 2021-04-09 NOTE — Progress Notes (Signed)
Pt taken to CT and returned to ED 2 on the vent without incident.Pt remains on the vent and is tol well at this time.

## 2021-04-09 NOTE — Progress Notes (Addendum)
eLink Physician-Brief Progress Note Patient Name: Andrea Rivers DOB: 1956/05/27 MRN: [id number]   Date of Service  2021-03-29  HPI/Events of Note  64/F with DM, CHF, brought in due to unresponsiveness, went into PEA arrest upon arrival in the ED. ROSC obtained after 2 rounds of epinephrine.  Pt is on multiple pressors.  She was noted to be anemic. INR 1.4 Hgb 6.4 <-- 11.7 Na 138, K 4.1, chloride 98 Anion gap 28 Procalcitonin 0.21 Crea 2.71 <-- 2.17 Lactic acid >11 EKG sinus rhythm, ST depression  eICU Interventions  S/p PEA arrest Encephalopathy Lactic acidosis Anemia AKI Acute respiratory failure  Continue pressors, HCO3 gtt.  Blood transfusion, empiric antibiotics. IR. Protonix. SCDs.     Intervention Category Evaluation Type: New Patient Evaluation  Elsie Lincoln 03-29-2021, 3:47 AM

## 2021-04-09 NOTE — Progress Notes (Signed)
GOALS OF CARE DISCUSSION  The Clinical status was relayed to family in detail. Niece Tiffany  Updated and notified of patients medical condition.    Patient remains unresponsive and will not open eyes to command.   Patient is having a weak cough and struggling to remove secretions.   Patient with increased WOB and using accessory muscles to breathe Explained to family course of therapy and the modalities    Patient with Progressive multiorgan failure with a very high probablity of a very minimal chance of meaningful recovery despite all aggressive and optimal medical therapy.  PATIENT REMAINS DNR  Family understands the situation.  Family are satisfied with Plan of action and management. All questions answered  Additional CC time 35 mins   Erza Mothershead Patricia Pesa, M.D.  [facility]  Medical Director [place] Director [facility] Cardio-Pulmonary Department

## 2021-04-09 NOTE — Procedures (Signed)
Interventional Radiology Procedure Note  Procedure:  US guided access left CFA Angiogram right iliac artery, right inferior epigastric artery.  Coil embolization of right inferior epigastric artery, as source of hemorrhage. Failed Starclose, with manual pressure  Findings: Source of hemorrhage was identified on angio from inf epigastric artery, coil embo performed.  .  Complications: None  Recommendations:  - Left hip/leg straight x 6 hours, with pressure dressing in place - critical to ICU - continue serial H&H - Do not submerge for 7 days - Routine wound care   Signed,  Dulcy Fanny. Earleen Newport, DO

## 2021-04-09 NOTE — Progress Notes (Signed)
Patient expired at 1801 2021-03-29. Family at bedside.  Harl Favor NP and Corbin Ade MD notified of death.

## 2021-04-09 DEATH — deceased

## 2021-08-14 ENCOUNTER — Other Ambulatory Visit: Payer: Self-pay | Admitting: Family
